# Patient Record
Sex: Female | Born: 1965 | Race: White | Hispanic: No | Marital: Single | Smoking: Former smoker
Health system: Southern US, Community
[De-identification: ages and names within clinical notes are randomized; demographics above are authoritative.]

## PROBLEM LIST (undated history)

## (undated) DIAGNOSIS — B009 Herpesviral infection, unspecified: Secondary | ICD-10-CM

## (undated) DIAGNOSIS — G4730 Sleep apnea, unspecified: Secondary | ICD-10-CM

## (undated) DIAGNOSIS — J45909 Unspecified asthma, uncomplicated: Secondary | ICD-10-CM

## (undated) DIAGNOSIS — F329 Major depressive disorder, single episode, unspecified: Secondary | ICD-10-CM

## (undated) DIAGNOSIS — Z803 Family history of malignant neoplasm of breast: Secondary | ICD-10-CM

## (undated) DIAGNOSIS — M5020 Other cervical disc displacement, unspecified cervical region: Secondary | ICD-10-CM

## (undated) DIAGNOSIS — F32A Depression, unspecified: Secondary | ICD-10-CM

## (undated) DIAGNOSIS — J189 Pneumonia, unspecified organism: Secondary | ICD-10-CM

## (undated) DIAGNOSIS — G5601 Carpal tunnel syndrome, right upper limb: Secondary | ICD-10-CM

## (undated) DIAGNOSIS — Z9221 Personal history of antineoplastic chemotherapy: Secondary | ICD-10-CM

## (undated) DIAGNOSIS — T7840XA Allergy, unspecified, initial encounter: Secondary | ICD-10-CM

## (undated) DIAGNOSIS — M545 Low back pain, unspecified: Secondary | ICD-10-CM

## (undated) DIAGNOSIS — R0600 Dyspnea, unspecified: Secondary | ICD-10-CM

## (undated) DIAGNOSIS — M1990 Unspecified osteoarthritis, unspecified site: Secondary | ICD-10-CM

## (undated) DIAGNOSIS — Z923 Personal history of irradiation: Secondary | ICD-10-CM

## (undated) DIAGNOSIS — Z8042 Family history of malignant neoplasm of prostate: Secondary | ICD-10-CM

## (undated) DIAGNOSIS — K635 Polyp of colon: Secondary | ICD-10-CM

## (undated) DIAGNOSIS — Z87442 Personal history of urinary calculi: Secondary | ICD-10-CM

## (undated) DIAGNOSIS — C50919 Malignant neoplasm of unspecified site of unspecified female breast: Secondary | ICD-10-CM

## (undated) DIAGNOSIS — K222 Esophageal obstruction: Secondary | ICD-10-CM

## (undated) DIAGNOSIS — F419 Anxiety disorder, unspecified: Secondary | ICD-10-CM

## (undated) DIAGNOSIS — G43909 Migraine, unspecified, not intractable, without status migrainosus: Secondary | ICD-10-CM

## (undated) DIAGNOSIS — K200 Eosinophilic esophagitis: Secondary | ICD-10-CM

## (undated) DIAGNOSIS — M810 Age-related osteoporosis without current pathological fracture: Secondary | ICD-10-CM

## (undated) DIAGNOSIS — G5600 Carpal tunnel syndrome, unspecified upper limb: Secondary | ICD-10-CM

## (undated) DIAGNOSIS — E785 Hyperlipidemia, unspecified: Secondary | ICD-10-CM

## (undated) HISTORY — DX: Family history of malignant neoplasm of prostate: Z80.42

## (undated) HISTORY — DX: Age-related osteoporosis without current pathological fracture: M81.0

## (undated) HISTORY — DX: Anxiety disorder, unspecified: F41.9

## (undated) HISTORY — PX: BREAST LUMPECTOMY: SHX2

## (undated) HISTORY — DX: Unspecified asthma, uncomplicated: J45.909

## (undated) HISTORY — DX: Esophageal obstruction: K22.2

## (undated) HISTORY — DX: Carpal tunnel syndrome, unspecified upper limb: G56.00

## (undated) HISTORY — DX: Low back pain: M54.5

## (undated) HISTORY — DX: Hyperlipidemia, unspecified: E78.5

## (undated) HISTORY — DX: Polyp of colon: K63.5

## (undated) HISTORY — DX: Depression, unspecified: F32.A

## (undated) HISTORY — DX: Other cervical disc displacement, unspecified cervical region: M50.20

## (undated) HISTORY — PX: PORT-A-CATH REMOVAL: SHX5289

## (undated) HISTORY — DX: Herpesviral infection, unspecified: B00.9

## (undated) HISTORY — DX: Allergy, unspecified, initial encounter: T78.40XA

## (undated) HISTORY — DX: Low back pain, unspecified: M54.50

## (undated) HISTORY — DX: Family history of malignant neoplasm of breast: Z80.3

## (undated) HISTORY — DX: Malignant neoplasm of unspecified site of unspecified female breast: C50.919

## (undated) HISTORY — PX: ESOPHAGOGASTRODUODENOSCOPY: SHX1529

## (undated) HISTORY — DX: Major depressive disorder, single episode, unspecified: F32.9

## (undated) HISTORY — DX: Eosinophilic esophagitis: K20.0

## (undated) HISTORY — DX: Migraine, unspecified, not intractable, without status migrainosus: G43.909

## (undated) HISTORY — PX: HERNIA REPAIR: SHX51

---

## 2004-03-14 HISTORY — PX: UMBILICAL HERNIA REPAIR: SHX196

## 2013-03-14 HISTORY — PX: THROAT SURGERY: SHX803

## 2016-06-01 ENCOUNTER — Ambulatory Visit (INDEPENDENT_AMBULATORY_CARE_PROVIDER_SITE_OTHER): Payer: BLUE CROSS/BLUE SHIELD | Admitting: Family Medicine

## 2016-06-01 ENCOUNTER — Encounter: Payer: Self-pay | Admitting: Family Medicine

## 2016-06-01 VITALS — BP 132/86 | HR 72 | Temp 98.7°F | Ht 64.75 in | Wt 184.1 lb

## 2016-06-01 DIAGNOSIS — Z0001 Encounter for general adult medical examination with abnormal findings: Secondary | ICD-10-CM | POA: Diagnosis not present

## 2016-06-01 DIAGNOSIS — R030 Elevated blood-pressure reading, without diagnosis of hypertension: Secondary | ICD-10-CM | POA: Diagnosis not present

## 2016-06-01 DIAGNOSIS — R928 Other abnormal and inconclusive findings on diagnostic imaging of breast: Secondary | ICD-10-CM

## 2016-06-01 DIAGNOSIS — J45909 Unspecified asthma, uncomplicated: Secondary | ICD-10-CM | POA: Insufficient documentation

## 2016-06-01 DIAGNOSIS — Z23 Encounter for immunization: Secondary | ICD-10-CM

## 2016-06-01 LAB — LIPID PANEL
CHOL/HDL RATIO: 6
Cholesterol: 292 mg/dL — ABNORMAL HIGH (ref 0–200)
HDL: 51.1 mg/dL (ref >39.00)
NONHDL: 241.12
Triglycerides: 344 mg/dL — ABNORMAL HIGH (ref 0.0–149.0)
VLDL: 68.8 mg/dL — ABNORMAL HIGH (ref 0.0–40.0)

## 2016-06-01 LAB — COMPREHENSIVE METABOLIC PANEL
ALT: 14 U/L (ref 0–35)
AST: 15 U/L (ref 0–37)
Albumin: 4.3 g/dL (ref 3.5–5.2)
Alkaline Phosphatase: 49 U/L (ref 39–117)
BUN: 15 mg/dL (ref 6–23)
CHLORIDE: 104 meq/L (ref 96–112)
CO2: 26 meq/L (ref 19–32)
CREATININE: 0.62 mg/dL (ref 0.40–1.20)
Calcium: 10 mg/dL (ref 8.4–10.5)
GFR: 109.05 mL/min (ref >60.00)
GLUCOSE: 107 mg/dL — AB (ref 70–99)
Potassium: 4 mEq/L (ref 3.5–5.1)
SODIUM: 138 meq/L (ref 135–145)
Total Bilirubin: 0.7 mg/dL (ref 0.2–1.2)
Total Protein: 7 g/dL (ref 6.0–8.3)

## 2016-06-01 LAB — CBC
HEMATOCRIT: 44.4 % (ref 36.0–46.0)
Hemoglobin: 15.8 g/dL — ABNORMAL HIGH (ref 12.0–15.0)
MCHC: 35.5 g/dL (ref 30.0–36.0)
MCV: 89.3 fl (ref 78.0–100.0)
Platelets: 352 10*3/uL (ref 150.0–400.0)
RBC: 4.98 Mil/uL (ref 3.87–5.11)
RDW: 12.8 % (ref 11.5–15.5)
WBC: 9.7 10*3/uL (ref 4.0–10.5)

## 2016-06-01 LAB — TSH: TSH: 3.38 u[IU]/mL (ref 0.35–4.50)

## 2016-06-01 LAB — LDL CHOLESTEROL, DIRECT: LDL DIRECT: 156 mg/dL

## 2016-06-01 LAB — HEMOGLOBIN A1C: Hgb A1c MFr Bld: 5.1 % (ref 4.6–6.5)

## 2016-06-01 MED ORDER — IBUPROFEN 800 MG PO TABS: 800.0000 mg | ORAL_TABLET | Freq: Three times a day (TID) | ORAL | 0 refills | Status: DC | PRN

## 2016-06-01 NOTE — Assessment & Plan Note (Addendum)
Will obtain medical records. Flu vaccine today. Unsure of tetanus. Pap smear up-to-date. Mammogram up-to-date. Will go ahead and order her future mammogram as she needs a follow-up in July. Labs today. BP elevated. Follow-up in one month for reevaluation.

## 2016-06-01 NOTE — Progress Notes (Signed)
Subjective:  Patient ID: Leah [name], female    DOB: 03/20/1965  Age: 51 y.o. MRN: [id number]  CC: Establish care  HPI Leah [name] is a 51 y.o. female presents to the clinic today to establish care. She is in need of a physical exam.  Preventative Healthcare  Pap smear: Up to date.  Mammogram: Up-to-date. She will need a mammogram in July (follow up from prior abnormal).  Colonoscopy: N/A.  Immunizations  Tetanus - Unsure.  Pneumococcal - Not indicated.  Flu - Flu vaccine today.  Labs: Labs today.  Alcohol use: See below.  Smoking/tobacco use: Former.  STD/HIV testing: Has had screening.   PMH, Surgical Hx, Family Hx, Social History reviewed and updated as below.  Past Medical History:  Diagnosis Date  . Asthma   . Carpal tunnel syndrome   . Herniated disc, cervical   . Low back pain   . Migraines    Past Surgical History:  Procedure Laterality Date  . CESAREAN SECTION    . ESOPHAGOGASTRODUODENOSCOPY     Family History  Problem Relation Age of Onset  . Breast cancer Mother   . Prostate cancer Father   . Breast cancer Maternal Grandmother    Social History  Substance Use Topics  . Smoking status: Former Smoker    Quit date: 06/02/2006  . Smokeless tobacco: Never Used  . Alcohol use 1.2 - 1.8 oz/week    2 - 3 Standard drinks or equivalent per week    Review of Systems  HENT: Positive for tinnitus.   Musculoskeletal: Positive for back pain and neck pain.  Neurological: Positive for numbness and headaches.  Psychiatric/Behavioral:       Stress.  All other systems reviewed and are negative.   Objective:   Today's Vitals: BP 132/86   Pulse 72   Temp 98.7 F (37.1 C) (Oral)   Ht 5' 4.75" (1.645 m)   Wt 184 lb 2 oz (83.5 kg)   SpO2 98%   BMI 30.88 kg/m   Physical Exam  Constitutional: She is oriented to person, place, and time. She appears well-developed and well-nourished. No distress.  HENT:  Head: Normocephalic and atraumatic.    Nose: Nose normal.  Mouth/Throat: Oropharynx is clear and moist. No oropharyngeal exudate.  Normal TM's bilaterally.   Eyes: Conjunctivae are normal. No scleral icterus.  Neck: Neck supple.  Cardiovascular: Normal rate and regular rhythm.   No murmur heard. Pulmonary/Chest: Effort normal and breath sounds normal. She has no wheezes. She has no rales.  Abdominal: Soft. She exhibits no distension. There is no tenderness. There is no rebound and no guarding.  Musculoskeletal: Normal range of motion. She exhibits no edema.  Lymphadenopathy:    She has no cervical adenopathy.  Neurological: She is alert and oriented to person, place, and time.  Skin: Skin is warm and dry. No rash noted.  Psychiatric: She has a normal mood and affect.  Vitals reviewed.  Assessment & Plan:   Problem List Items Addressed This Visit    Elevated BP without diagnosis of hypertension   Encounter for general adult medical examination with abnormal findings - Primary    Will obtain medical records. Flu vaccine today. Unsure of tetanus. Pap smear up-to-date. Mammogram up-to-date. Will go ahead and order her future mammogram as she needs a follow-up in July. Labs today. BP elevated. Follow-up in one month for reevaluation.      Relevant Orders   CBC   Hemoglobin A1c   Comprehensive metabolic panel  Lipid panel   TSH    Other Visit Diagnoses    Encounter for immunization       Relevant Medications   methocarbamol (ROBAXIN) 750 MG tablet   ibuprofen (ADVIL,MOTRIN) 800 MG tablet   Other Relevant Orders   CBC   Hemoglobin A1c   Comprehensive metabolic panel   Lipid panel   TSH   Flu Vaccine QUAD 36+ mos IM (Completed)   Abnormal mammogram       Relevant Orders   MM Digital Diagnostic Bilat      Outpatient Encounter Prescriptions as of 06/01/2016  Medication Sig  . methocarbamol (ROBAXIN) 750 MG tablet   . ibuprofen (ADVIL,MOTRIN) 800 MG tablet Take 1 tablet (800 mg total) by mouth every 8  (eight) hours as needed.   No facility-administered encounter medications on file as of 06/01/2016.     Follow-up: Return in about 1 month (around 07/02/2016) for BP check.  [place]

## 2016-06-01 NOTE — Progress Notes (Signed)
Pre visit review using our clinic review tool, if applicable. No additional management support is needed unless otherwise documented below in the visit note. 

## 2016-06-01 NOTE — Patient Instructions (Signed)
We will get your records and take care of your mammogram in July.  Ibuprofen as needed.  Follow up in 1 month for a BP check.  Take care  Dr. Lacinda Axon   Health Maintenance, Female Adopting a healthy lifestyle and getting preventive care can go a long way to promote health and wellness. Talk with your health care provider about what schedule of regular examinations is right for you. This is a good chance for you to check in with your provider about disease prevention and staying healthy. In between checkups, there are plenty of things you can do on your own. Experts have done a lot of research about which lifestyle changes and preventive measures are most likely to keep you healthy. Ask your health care provider for more information. Weight and diet Eat a healthy diet  Be sure to include plenty of vegetables, fruits, low-fat dairy products, and lean protein.  Do not eat a lot of foods high in solid fats, added sugars, or salt.  Get regular exercise. This is one of the most important things you can do for your health.  Most adults should exercise for at least 150 minutes each week. The exercise should increase your heart rate and make you sweat (moderate-intensity exercise).  Most adults should also do strengthening exercises at least twice a week. This is in addition to the moderate-intensity exercise. Maintain a healthy weight  Body mass index (BMI) is a measurement that can be used to identify possible weight problems. It estimates body fat based on height and weight. Your health care provider can help determine your BMI and help you achieve or maintain a healthy weight.  For females 78 years of age and older:  A BMI below 18.5 is considered underweight.  A BMI of 18.5 to 24.9 is normal.  A BMI of 25 to 29.9 is considered overweight.  A BMI of 30 and above is considered obese. Watch levels of cholesterol and blood lipids  You should start having your blood tested for lipids and  cholesterol at 49 years of age, then have this test every 5 years.  You may need to have your cholesterol levels checked more often if:  Your lipid or cholesterol levels are high.  You are older than 49 years of age.  You are at high risk for heart disease. Cancer screening Lung Cancer  Lung cancer screening is recommended for adults 52-107 years old who are at high risk for lung cancer because of a history of smoking.  A yearly low-dose CT scan of the lungs is recommended for people who:  Currently smoke.  Have quit within the past 15 years.  Have at least a 30-pack-year history of smoking. A pack year is smoking an average of one pack of cigarettes a day for 1 year.  Yearly screening should continue until it has been 15 years since you quit.  Yearly screening should stop if you develop a health problem that would prevent you from having lung cancer treatment. Breast Cancer  Practice breast self-awareness. This means understanding how your breasts normally appear and feel.  It also means doing regular breast self-exams. Let your health care provider know about any changes, no matter how small.  If you are in your 20s or 30s, you should have a clinical breast exam (CBE) by a health care provider every 1-3 years as part of a regular health exam.  If you are 78 or older, have a CBE every year. Also consider having a  breast X-ray (mammogram) every year.  If you have a family history of breast cancer, talk to your health care provider about genetic screening.  If you are at high risk for breast cancer, talk to your health care provider about having an MRI and a mammogram every year.  Breast cancer gene (BRCA) assessment is recommended for women who have family members with BRCA-related cancers. BRCA-related cancers include:  Breast.  Ovarian.  Tubal.  Peritoneal cancers.  Results of the assessment will determine the need for genetic counseling and BRCA1 and BRCA2  testing. Cervical Cancer  Your health care provider may recommend that you be screened regularly for cancer of the pelvic organs (ovaries, uterus, and vagina). This screening involves a pelvic examination, including checking for microscopic changes to the surface of your cervix (Pap test). You may be encouraged to have this screening done every 3 years, beginning at age 46.  For women ages 34-65, health care providers may recommend pelvic exams and Pap testing every 3 years, or they may recommend the Pap and pelvic exam, combined with testing for human papilloma virus (HPV), every 5 years. Some types of HPV increase your risk of cervical cancer. Testing for HPV may also be done on women of any age with unclear Pap test results.  Other health care providers may not recommend any screening for nonpregnant women who are considered low risk for pelvic cancer and who do not have symptoms. Ask your health care provider if a screening pelvic exam is right for you.  If you have had past treatment for cervical cancer or a condition that could lead to cancer, you need Pap tests and screening for cancer for at least 20 years after your treatment. If Pap tests have been discontinued, your risk factors (such as having a new sexual partner) need to be reassessed to determine if screening should resume. Some women have medical problems that increase the chance of getting cervical cancer. In these cases, your health care provider may recommend more frequent screening and Pap tests. Colorectal Cancer  This type of cancer can be detected and often prevented.  Routine colorectal cancer screening usually begins at 49 years of age and continues through 49 years of age.  Your health care provider may recommend screening at an earlier age if you have risk factors for colon cancer.  Your health care provider may also recommend using home test kits to check for hidden blood in the stool.  A small camera at the end of a  tube can be used to examine your colon directly (sigmoidoscopy or colonoscopy). This is done to check for the earliest forms of colorectal cancer.  Routine screening usually begins at age 11.  Direct examination of the colon should be repeated every 5-10 years through 49 years of age. However, you may need to be screened more often if early forms of precancerous polyps or small growths are found. Skin Cancer  Check your skin from head to toe regularly.  Tell your health care provider about any new moles or changes in moles, especially if there is a change in a mole's shape or color.  Also tell your health care provider if you have a mole that is larger than the size of a pencil eraser.  Always use sunscreen. Apply sunscreen liberally and repeatedly throughout the day.  Protect yourself by wearing long sleeves, pants, a wide-brimmed hat, and sunglasses whenever you are outside. Heart disease, diabetes, and high blood pressure  High blood pressure causes  heart disease and increases the risk of stroke. High blood pressure is more likely to develop in:  People who have blood pressure in the high end of the normal range (130-139/85-89 mm Hg).  People who are overweight or obese.  People who are African American.  If you are 70-59 years of age, have your blood pressure checked every 3-5 years. If you are 82 years of age or older, have your blood pressure checked every year. You should have your blood pressure measured twice-once when you are at a hospital or clinic, and once when you are not at a hospital or clinic. Record the average of the two measurements. To check your blood pressure when you are not at a hospital or clinic, you can use:  An automated blood pressure machine at a pharmacy.  A home blood pressure monitor.  If you are between 16 years and 24 years old, ask your health care provider if you should take aspirin to prevent strokes.  Have regular diabetes screenings. This  involves taking a blood sample to check your fasting blood sugar level.  If you are at a normal weight and have a low risk for diabetes, have this test once every three years after 49 years of age.  If you are overweight and have a high risk for diabetes, consider being tested at a younger age or more often. Preventing infection Hepatitis B  If you have a higher risk for hepatitis B, you should be screened for this virus. You are considered at high risk for hepatitis B if:  You were born in a country where hepatitis B is common. Ask your health care provider which countries are considered high risk.  Your parents were born in a high-risk country, and you have not been immunized against hepatitis B (hepatitis B vaccine).  You have HIV or AIDS.  You use needles to inject street drugs.  You live with someone who has hepatitis B.  You have had sex with someone who has hepatitis B.  You get hemodialysis treatment.  You take certain medicines for conditions, including cancer, organ transplantation, and autoimmune conditions. Hepatitis C  Blood testing is recommended for:  Everyone born from [birth year] through 1963.  Anyone with known risk factors for hepatitis C. Sexually transmitted infections (STIs)  You should be screened for sexually transmitted infections (STIs) including gonorrhea and chlamydia if:  You are sexually active and are younger than 49 years of age.  You are older than 49 years of age and your health care provider tells you that you are at risk for this type of infection.  Your sexual activity has changed since you were last screened and you are at an increased risk for chlamydia or gonorrhea. Ask your health care provider if you are at risk.  If you do not have HIV, but are at risk, it may be recommended that you take a prescription medicine daily to prevent HIV infection. This is called pre-exposure prophylaxis (PrEP). You are considered at risk if:  You are  sexually active and do not regularly use condoms or know the HIV status of your partner(s).  You take drugs by injection.  You are sexually active with a partner who has HIV. Talk with your health care provider about whether you are at high risk of being infected with HIV. If you choose to begin PrEP, you should first be tested for HIV. You should then be tested every 3 months for as long as you are taking  PrEP. Pregnancy  If you are premenopausal and you may become pregnant, ask your health care provider about preconception counseling.  If you may become pregnant, take 400 to 800 micrograms (mcg) of folic acid every day.  If you want to prevent pregnancy, talk to your health care provider about birth control (contraception). Osteoporosis and menopause  Osteoporosis is a disease in which the bones lose minerals and strength with aging. This can result in serious bone fractures. Your risk for osteoporosis can be identified using a bone density scan.  If you are 24 years of age or older, or if you are at risk for osteoporosis and fractures, ask your health care provider if you should be screened.  Ask your health care provider whether you should take a calcium or vitamin D supplement to lower your risk for osteoporosis.  Menopause may have certain physical symptoms and risks.  Hormone replacement therapy may reduce some of these symptoms and risks. Talk to your health care provider about whether hormone replacement therapy is right for you. Follow these instructions at home:  Schedule regular health, dental, and eye exams.  Stay current with your immunizations.  Do not use any tobacco products including cigarettes, chewing tobacco, or electronic cigarettes.  If you are pregnant, do not drink alcohol.  If you are breastfeeding, limit how much and how often you drink alcohol.  Limit alcohol intake to no more than 1 drink per day for nonpregnant women. One drink equals 12 ounces of  beer, 5 ounces of wine, or 1 ounces of hard liquor.  Do not use street drugs.  Do not share needles.  Ask your health care provider for help if you need support or information about quitting drugs.  Tell your health care provider if you often feel depressed.  Tell your health care provider if you have ever been abused or do not feel safe at home. This information is not intended to replace advice given to you by your health care provider. Make sure you discuss any questions you have with your health care provider. Document Released: 09/13/2010 Document Revised: 08/06/2015 Document Reviewed: 12/02/2014 Elsevier Interactive Patient Education  2017 [employer].

## 2016-06-03 ENCOUNTER — Other Ambulatory Visit: Payer: Self-pay | Admitting: Family Medicine

## 2016-06-03 DIAGNOSIS — D582 Other hemoglobinopathies: Secondary | ICD-10-CM

## 2016-06-30 ENCOUNTER — Other Ambulatory Visit: Payer: Self-pay | Admitting: Family Medicine

## 2016-06-30 DIAGNOSIS — Z1239 Encounter for other screening for malignant neoplasm of breast: Secondary | ICD-10-CM

## 2016-07-05 ENCOUNTER — Ambulatory Visit (INDEPENDENT_AMBULATORY_CARE_PROVIDER_SITE_OTHER): Payer: BLUE CROSS/BLUE SHIELD | Admitting: *Deleted

## 2016-07-05 ENCOUNTER — Other Ambulatory Visit (INDEPENDENT_AMBULATORY_CARE_PROVIDER_SITE_OTHER): Payer: BLUE CROSS/BLUE SHIELD

## 2016-07-05 VITALS — BP 150/80 | HR 81 | Resp 16

## 2016-07-05 DIAGNOSIS — R030 Elevated blood-pressure reading, without diagnosis of hypertension: Secondary | ICD-10-CM

## 2016-07-05 DIAGNOSIS — D582 Other hemoglobinopathies: Secondary | ICD-10-CM | POA: Diagnosis not present

## 2016-07-05 LAB — CBC
HCT: 44 % (ref 36.0–46.0)
Hemoglobin: 15.6 g/dL — ABNORMAL HIGH (ref 12.0–15.0)
MCHC: 35.5 g/dL (ref 30.0–36.0)
MCV: 89.9 fl (ref 78.0–100.0)
Platelets: 344 10*3/uL (ref 150.0–400.0)
RBC: 4.89 Mil/uL (ref 3.87–5.11)
RDW: 12.5 % (ref 11.5–15.5)
WBC: 8.7 10*3/uL (ref 4.0–10.5)

## 2016-07-05 NOTE — Progress Notes (Signed)
One month follow up for elevated BP check p, patient is not currently on medication . BP left arm 146/90 pulse 88, after 5- m8 minute wait BP right arm 150/80 pulse 81 ,. BP was attained according to nursing standards.

## 2016-07-05 NOTE — Progress Notes (Signed)
BP still elevated. Would advise starting medication. Will defer to PCP regarding type of medication to start. Will forward to Dr Lacinda Axon.   Tommi Rumps, M.D.

## 2016-07-06 ENCOUNTER — Telehealth: Payer: Self-pay | Admitting: Family Medicine

## 2016-07-06 ENCOUNTER — Other Ambulatory Visit: Payer: Self-pay | Admitting: Family Medicine

## 2016-07-06 DIAGNOSIS — D582 Other hemoglobinopathies: Secondary | ICD-10-CM

## 2016-07-06 DIAGNOSIS — M62838 Other muscle spasm: Secondary | ICD-10-CM

## 2016-07-06 MED ORDER — METHOCARBAMOL 750 MG PO TABS: 750.0000 mg | ORAL_TABLET | Freq: Four times a day (QID) | ORAL | 0 refills | Status: DC | PRN

## 2016-07-06 NOTE — Telephone Encounter (Signed)
Refill sent.

## 2016-07-06 NOTE — Progress Notes (Signed)
Needs to start medication.  [place]

## 2016-07-06 NOTE — Telephone Encounter (Signed)
Patient in Office on 07/05/16 for BP check,requested refill for methocarbamol from historical provider ok to fill medication?

## 2016-07-06 NOTE — Telephone Encounter (Signed)
Okay to refill? 

## 2016-07-06 NOTE — Progress Notes (Signed)
Patient willing to start BP medication.

## 2016-07-08 ENCOUNTER — Inpatient Hospital Stay
Admission: RE | Admit: 2016-07-08 | Discharge: 2016-07-08 | Disposition: A | Discharge status: Home / Self Care | Payer: Self-pay | Source: Ambulatory Visit | Attending: *Deleted | Admitting: *Deleted

## 2016-07-08 ENCOUNTER — Other Ambulatory Visit: Payer: Self-pay | Admitting: *Deleted

## 2016-07-08 DIAGNOSIS — Z9289 Personal history of other medical treatment: Secondary | ICD-10-CM

## 2016-07-16 ENCOUNTER — Other Ambulatory Visit: Payer: Self-pay | Admitting: Family Medicine

## 2016-07-18 NOTE — Telephone Encounter (Signed)
Please advise for refill, thanks 

## 2016-07-29 ENCOUNTER — Encounter: Payer: Self-pay | Admitting: Oncology

## 2016-07-29 ENCOUNTER — Other Ambulatory Visit: Payer: Self-pay

## 2016-07-29 ENCOUNTER — Inpatient Hospital Stay: Payer: BLUE CROSS/BLUE SHIELD | Attending: Oncology | Admitting: Oncology

## 2016-07-29 ENCOUNTER — Ambulatory Visit
Admission: RE | Admit: 2016-07-29 | Discharge: 2016-07-29 | Disposition: A | Discharge status: Home / Self Care | Payer: BLUE CROSS/BLUE SHIELD | Source: Ambulatory Visit | Attending: Oncology | Admitting: Oncology

## 2016-07-29 ENCOUNTER — Inpatient Hospital Stay: Payer: BLUE CROSS/BLUE SHIELD

## 2016-07-29 VITALS — BP 147/91 | HR 80 | Temp 99.0°F | Resp 18 | Ht 64.96 in | Wt 182.1 lb

## 2016-07-29 DIAGNOSIS — G4700 Insomnia, unspecified: Secondary | ICD-10-CM | POA: Insufficient documentation

## 2016-07-29 DIAGNOSIS — Z87891 Personal history of nicotine dependence: Secondary | ICD-10-CM | POA: Insufficient documentation

## 2016-07-29 DIAGNOSIS — D751 Secondary polycythemia: Secondary | ICD-10-CM

## 2016-07-29 DIAGNOSIS — R030 Elevated blood-pressure reading, without diagnosis of hypertension: Secondary | ICD-10-CM | POA: Diagnosis not present

## 2016-07-29 DIAGNOSIS — Z803 Family history of malignant neoplasm of breast: Secondary | ICD-10-CM | POA: Insufficient documentation

## 2016-07-29 DIAGNOSIS — J45909 Unspecified asthma, uncomplicated: Secondary | ICD-10-CM | POA: Diagnosis not present

## 2016-07-29 DIAGNOSIS — M549 Dorsalgia, unspecified: Secondary | ICD-10-CM | POA: Insufficient documentation

## 2016-07-29 DIAGNOSIS — Z8042 Family history of malignant neoplasm of prostate: Secondary | ICD-10-CM | POA: Diagnosis not present

## 2016-07-29 LAB — COMPREHENSIVE METABOLIC PANEL
ALBUMIN: 4.3 g/dL (ref 3.5–5.0)
ALK PHOS: 45 U/L (ref 38–126)
ALT: 18 U/L (ref 14–54)
AST: 20 U/L (ref 15–41)
Anion gap: 6 (ref 5–15)
BILIRUBIN TOTAL: 0.8 mg/dL (ref 0.3–1.2)
BUN: 14 mg/dL (ref 6–20)
CALCIUM: 9.6 mg/dL (ref 8.9–10.3)
CO2: 24 mmol/L (ref 22–32)
CREATININE: 0.58 mg/dL (ref 0.44–1.00)
Chloride: 104 mmol/L (ref 101–111)
GFR calc Af Amer: >60 mL/min (ref >60)
GFR calc non Af Amer: >60 mL/min (ref >60)
GLUCOSE: 108 mg/dL — AB (ref 65–99)
Potassium: 3.9 mmol/L (ref 3.5–5.1)
SODIUM: 134 mmol/L — AB (ref 135–145)
Total Protein: 7.8 g/dL (ref 6.5–8.1)

## 2016-07-29 LAB — URINALYSIS, COMPLETE (UACMP) WITH MICROSCOPIC
BACTERIA UA: NONE SEEN
BILIRUBIN URINE: NEGATIVE
Glucose, UA: NEGATIVE mg/dL
Hgb urine dipstick: NEGATIVE
Ketones, ur: NEGATIVE mg/dL
Nitrite: NEGATIVE
Protein, ur: NEGATIVE mg/dL
SPECIFIC GRAVITY, URINE: 1.012 (ref 1.005–1.030)
pH: 5 (ref 5.0–8.0)

## 2016-07-29 LAB — CBC WITH DIFFERENTIAL/PLATELET
BASOS PCT: 1 %
Basophils Absolute: 0.1 10*3/uL (ref 0–0.1)
EOS ABS: 0.3 10*3/uL (ref 0–0.7)
Eosinophils Relative: 4 %
HEMATOCRIT: 44.9 % (ref 35.0–47.0)
HEMOGLOBIN: 15.9 g/dL (ref 12.0–16.0)
LYMPHS ABS: 1.8 10*3/uL (ref 1.0–3.6)
Lymphocytes Relative: 24 %
MCH: 31.7 pg (ref 26.0–34.0)
MCHC: 35.5 g/dL (ref 32.0–36.0)
MCV: 89.4 fL (ref 80.0–100.0)
Monocytes Absolute: 0.8 10*3/uL (ref 0.2–0.9)
Monocytes Relative: 10 %
NEUTROS ABS: 4.8 10*3/uL (ref 1.4–6.5)
NEUTROS PCT: 61 %
Platelets: 352 10*3/uL (ref 150–440)
RBC: 5.02 MIL/uL (ref 3.80–5.20)
RDW: 12.1 % (ref 11.5–14.5)
WBC: 7.8 10*3/uL (ref 3.6–11.0)

## 2016-07-29 NOTE — Progress Notes (Signed)
Hematology/Oncology Consult note [facility] Telephone:([phone] [phone]  Patient Care Team: Coral Spikes, DO as PCP - General (Family Medicine)   Name of the patient: Leah [name]  [id number]  1965/12/13    Reason for referral- polycythemia   Referring physician- Dr. Thersa Salt  Date of visit: 07/29/16   History of presenting illness- patient is a 51 year old female with no significant past medical history who was been referred to [place] for elevated hemoglobin. She smoked for about 20 years on and off and quit smoking about 6-7 years ago. She does not have any known chronic lung disease. She does report trouble with sleeping and wakes up a few times in the night. Recent hemoglobin from March and April 2018 was 15.8 and 15.6 respectively. Recent CBC from 07/05/2016 showed white count of 8.7, H&H of 15.6/44 and a platelet count of 344  ECOG PS- 0  Pain scale- 0   Review of systems- Review of Systems  Constitutional: Negative for chills, fever, malaise/fatigue and weight loss.  HENT: Negative for congestion, ear discharge and nosebleeds.   Eyes: Negative for blurred vision.  Respiratory: Negative for cough, hemoptysis, sputum production, shortness of breath and wheezing.   Cardiovascular: Negative for chest pain, palpitations, orthopnea and claudication.  Gastrointestinal: Negative for abdominal pain, blood in stool, constipation, diarrhea, heartburn, melena, nausea and vomiting.  Genitourinary: Negative for dysuria, flank pain, frequency, hematuria and urgency.  Musculoskeletal: Negative for back pain, joint pain and myalgias.  Skin: Negative for rash.  Neurological: Negative for dizziness, tingling, focal weakness, seizures, weakness and headaches.  Endo/Heme/Allergies: Does not bruise/bleed easily.  Psychiatric/Behavioral: Negative for depression and suicidal ideas. The patient does not have insomnia.     Allergies  Allergen Reactions  .  Penicillins   . Sulfa Antibiotics     Patient Active Problem List   Diagnosis Date Noted  . Elevated BP without diagnosis of hypertension 06/01/2016  . Asthma 06/01/2016  . Encounter for general adult medical examination with abnormal findings 06/01/2016     Past Medical History:  Diagnosis Date  . Asthma   . Carpal tunnel syndrome    R hand   . Herniated disc, cervical   . Low back pain   . Migraines      Past Surgical History:  Procedure Laterality Date  . CESAREAN SECTION    . ESOPHAGOGASTRODUODENOSCOPY    . THROAT SURGERY  2015  . UMBILICAL HERNIA REPAIR  2006    x 2   w mesh per pt    Social History   Social History  . Marital status: Single    Spouse name: N/A  . Number of children: N/A  . Years of education: N/A   Occupational History  . Not on file.   Social History Main Topics  . Smoking status: Former Smoker    Packs/day: 1.00    Years: 20.00    Quit date: 06/02/2006  . Smokeless tobacco: Never Used  . Alcohol use 1.2 - 1.8 oz/week    2 - 3 Standard drinks or equivalent per week     Comment: socially  . Drug use: No  . Sexual activity: Yes    Partners: Male   Other Topics Concern  . Not on file   Social History Narrative  . No narrative on file     Family History  Problem Relation Age of Onset  . Breast cancer Mother   . Thyroid disease Mother   . Prostate cancer  Father   . Cancer - Prostate Father   . Breast cancer Maternal Grandmother      Current Outpatient Prescriptions:  .  ibuprofen (ADVIL,MOTRIN) 800 MG tablet, TAKE 1 TABLET (800 MG TOTAL) BY MOUTH EVERY 8 (EIGHT) HOURS AS NEEDED., Disp: 90 tablet, Rfl: 0 .  methocarbamol (ROBAXIN) 750 MG tablet, Take 1 tablet (750 mg total) by mouth every 6 (six) hours as needed for muscle spasms. (Patient not taking: Reported on 07/29/2016), Disp: 120 tablet, Rfl: 0   Physical exam:  Vitals:   07/29/16 1131  BP: (!) 147/91  Pulse: 80  Resp: 18  Temp: 99 F (37.2 C)  TempSrc:  Tympanic  Weight: 182 lb 1.6 oz (82.6 kg)  Height: 5' 4.96" (1.65 m)   Physical Exam  Constitutional: She is oriented to person, place, and time and well-developed, well-nourished, and in no distress.  HENT:  Head: Normocephalic and atraumatic.  Eyes: EOM are normal. Pupils are equal, round, and reactive to light.  Neck: Normal range of motion.  Cardiovascular: Normal rate, regular rhythm and normal heart sounds.   Pulmonary/Chest: Effort normal and breath sounds normal.  Abdominal: Soft. Bowel sounds are normal.  No palpable splenomegaly  Neurological: She is alert and oriented to person, place, and time.  Skin: Skin is warm and dry.       CMP Latest Ref Rng & Units 07/29/2016  Glucose 65 - 99 mg/dL 108(H)  BUN 6 - 20 mg/dL 14  Creatinine 0.44 - 1.00 mg/dL 0.58  Sodium 135 - 145 mmol/L 134(L)  Potassium 3.5 - 5.1 mmol/L 3.9  Chloride 101 - 111 mmol/L 104  CO2 22 - 32 mmol/L 24  Calcium 8.9 - 10.3 mg/dL 9.6  Total Protein 6.5 - 8.1 g/dL 7.8  Total Bilirubin 0.3 - 1.2 mg/dL 0.8  Alkaline Phos 38 - 126 U/L 45  AST 15 - 41 U/L 20  ALT 14 - 54 U/L 18   CBC Latest Ref Rng & Units 07/29/2016  WBC 3.6 - 11.0 K/uL 7.8  Hemoglobin 12.0 - 16.0 g/dL 15.9  Hematocrit 35.0 - 47.0 % 44.9  Platelets 150 - 440 K/uL 352    No images are attached to the encounter.  Dg Chest 2 View  Result Date: 07/29/2016 CLINICAL DATA:  Elevated hemoglobin. History of polycythemia and asthma. EXAM: CHEST  2 VIEW COMPARISON:  None. FINDINGS: Cardiomediastinal silhouette is normal. No pleural effusions or focal consolidations. Trachea projects midline and there is no pneumothorax. Soft tissue planes and included osseous structures are non-suspicious. IMPRESSION: Normal chest. Electronically Signed   By: Elon Alas M.D.   On: 07/29/2016 13:42   Mm Outside Films Mammo  Result Date: 07/08/2016 This examination belongs to an outside facility and is stored here for comparison purposes only.  Contact  the originating outside institution for any associated report or interpretation.   Assessment and plan- Patient is a 51 y.o. female referred to [place] for elevated hemoglobin  Prior 2 cbc's show hb around 15 and hct 44. Hb has never been more than 16 or hct more than 48. Strictly speaking patient does not meet the definition for polycythemia since her hemoglobin has never been more than 16 or hematocrit more than 48. However given her high normal hemoglobin and I will proceed with CBC with differential, cmp, jak 2, abl testing and epo level. I will also obtain chest x-ray and urinalysis. I explained that polycythemia can be primary or secondary. Secondary causes include OSA, smoking,c hronic lung disease  or tumor associated polycythemia. I will see her back in 2 weeks and discuss results of blood work and further management   Thank you for this kind referral and the opportunity to participate in the care of this patient   Visit Diagnosis 1. Polycythemia     Dr. Randa Evens, MD, MPH [place] at [facility] Pager- [phone] 07/29/2016

## 2016-07-29 NOTE — Progress Notes (Signed)
Here for new pt evaluation  ADD  Pulse ox -98% on RA

## 2016-07-30 LAB — ERYTHROPOIETIN: Erythropoietin: 10.7 m[IU]/mL (ref 2.6–18.5)

## 2016-08-05 LAB — JAK2 EXONS 12-15

## 2016-08-19 ENCOUNTER — Inpatient Hospital Stay: Payer: BLUE CROSS/BLUE SHIELD | Attending: Oncology | Admitting: Oncology

## 2016-08-19 VITALS — BP 139/86 | HR 58 | Temp 97.6°F | Wt 180.1 lb

## 2016-08-19 DIAGNOSIS — Z803 Family history of malignant neoplasm of breast: Secondary | ICD-10-CM | POA: Diagnosis not present

## 2016-08-19 DIAGNOSIS — D751 Secondary polycythemia: Secondary | ICD-10-CM

## 2016-08-19 DIAGNOSIS — Z87891 Personal history of nicotine dependence: Secondary | ICD-10-CM | POA: Insufficient documentation

## 2016-08-19 DIAGNOSIS — Z8042 Family history of malignant neoplasm of prostate: Secondary | ICD-10-CM | POA: Insufficient documentation

## 2016-08-19 DIAGNOSIS — J45909 Unspecified asthma, uncomplicated: Secondary | ICD-10-CM | POA: Insufficient documentation

## 2016-08-19 NOTE — Progress Notes (Signed)
Patient here today for follow up.   

## 2016-08-20 ENCOUNTER — Encounter: Payer: Self-pay | Admitting: Oncology

## 2016-08-20 NOTE — Progress Notes (Signed)
Hematology/Oncology Consult note [facility]  Telephone:([phone] [phone]  Patient Care Team: Coral Spikes, DO as PCP - General (Family Medicine)   Name of the patient: Leah [name]  [id number]  [birth year]   Date of visit: 08/20/16  Diagnosis- secondary polycythemia etiology unclear- prior smoking versus possible undiagnosed OSA  Chief complaint/ Reason for visit- discuss results of bloodwork  Heme/Onc history: patient is a 51 year old female with no significant past medical history who was been referred to [place] for elevated hemoglobin. She smoked for about 20 years on and off and quit smoking about 6-7 years ago. She does not have any known chronic lung disease. She does report trouble with sleeping and wakes up a few times in the night. Recent hemoglobin from March and April 2018 was 15.8 and 15.6 respectively. Recent CBC from 07/05/2016 showed white count of 8.7, H&H of 15.6/44 and a platelet count of 344  Repeat cbc from 07/29/16 showed H/H of 15.9/44.9. Wbc and platelet count were normal. epo level was normal. JAK2 mutation testing negative. UA did not reveal any hematuria. cxr was normal  Interval history- doing well. Denies any complaints  Review of systems- Review of Systems  Constitutional: Negative for chills, fever, malaise/fatigue and weight loss.  HENT: Negative for congestion, ear discharge and nosebleeds.   Eyes: Negative for blurred vision.  Respiratory: Negative for cough, hemoptysis, sputum production, shortness of breath and wheezing.   Cardiovascular: Negative for chest pain, palpitations, orthopnea and claudication.  Gastrointestinal: Negative for abdominal pain, blood in stool, constipation, diarrhea, heartburn, melena, nausea and vomiting.  Genitourinary: Negative for dysuria, flank pain, frequency, hematuria and urgency.  Musculoskeletal: Negative for back pain, joint pain and myalgias.  Skin: Negative for rash.    Neurological: Negative for dizziness, tingling, focal weakness, seizures, weakness and headaches.  Endo/Heme/Allergies: Does not bruise/bleed easily.  Psychiatric/Behavioral: Negative for depression and suicidal ideas. The patient does not have insomnia.       Allergies  Allergen Reactions  . Penicillins   . Sulfa Antibiotics      Past Medical History:  Diagnosis Date  . Asthma   . Carpal tunnel syndrome    R hand   . Herniated disc, cervical   . Low back pain   . Migraines      Past Surgical History:  Procedure Laterality Date  . CESAREAN SECTION    . ESOPHAGOGASTRODUODENOSCOPY    . THROAT SURGERY  2015  . UMBILICAL HERNIA REPAIR  2006    x 2   w mesh per pt    Social History   Social History  . Marital status: Single    Spouse name: N/A  . Number of children: N/A  . Years of education: N/A   Occupational History  . Not on file.   Social History Main Topics  . Smoking status: Former Smoker    Packs/day: 1.00    Years: 20.00    Quit date: 06/02/2006  . Smokeless tobacco: Never Used  . Alcohol use 1.2 - 1.8 oz/week    2 - 3 Standard drinks or equivalent per week     Comment: socially  . Drug use: No  . Sexual activity: Yes    Partners: Male   Other Topics Concern  . Not on file   Social History Narrative  . No narrative on file    Family History  Problem Relation Age of Onset  . Breast cancer Mother   . Thyroid disease Mother   .  Prostate cancer Father   . Cancer - Prostate Father   . Breast cancer Maternal Grandmother      Current Outpatient Prescriptions:  .  ibuprofen (ADVIL,MOTRIN) 800 MG tablet, TAKE 1 TABLET (800 MG TOTAL) BY MOUTH EVERY 8 (EIGHT) HOURS AS NEEDED., Disp: 90 tablet, Rfl: 0 .  methocarbamol (ROBAXIN) 750 MG tablet, Take 1 tablet (750 mg total) by mouth every 6 (six) hours as needed for muscle spasms., Disp: 120 tablet, Rfl: 0  Physical exam:  Vitals:   08/19/16 1100  BP: 139/86  Pulse: (!) 58  Temp: 97.6 F (36.4  C)  TempSrc: Tympanic  Weight: 180 lb 2 oz (81.7 kg)   Physical Exam  Constitutional: She is oriented to person, place, and time and well-developed, well-nourished, and in no distress.  HENT:  Head: Normocephalic and atraumatic.  Eyes: EOM are normal. Pupils are equal, round, and reactive to light.  Neck: Normal range of motion.  Cardiovascular: Normal rate, regular rhythm and normal heart sounds.   Pulmonary/Chest: Effort normal and breath sounds normal.  Abdominal: Soft. Bowel sounds are normal.  Neurological: She is alert and oriented to person, place, and time.  Skin: Skin is warm and dry.     CMP Latest Ref Rng & Units 07/29/2016  Glucose 65 - 99 mg/dL 108(H)  BUN 6 - 20 mg/dL 14  Creatinine 0.44 - 1.00 mg/dL 0.58  Sodium 135 - 145 mmol/L 134(L)  Potassium 3.5 - 5.1 mmol/L 3.9  Chloride 101 - 111 mmol/L 104  CO2 22 - 32 mmol/L 24  Calcium 8.9 - 10.3 mg/dL 9.6  Total Protein 6.5 - 8.1 g/dL 7.8  Total Bilirubin 0.3 - 1.2 mg/dL 0.8  Alkaline Phos 38 - 126 U/L 45  AST 15 - 41 U/L 20  ALT 14 - 54 U/L 18   CBC Latest Ref Rng & Units 07/29/2016  WBC 3.6 - 11.0 K/uL 7.8  Hemoglobin 12.0 - 16.0 g/dL 15.9  Hematocrit 35.0 - 47.0 % 44.9  Platelets 150 - 440 K/uL 352    No images are attached to the encounter.  Dg Chest 2 View  Result Date: 07/29/2016 CLINICAL DATA:  Elevated hemoglobin. History of polycythemia and asthma. EXAM: CHEST  2 VIEW COMPARISON:  None. FINDINGS: Cardiomediastinal silhouette is normal. No pleural effusions or focal consolidations. Trachea projects midline and there is no pneumothorax. Soft tissue planes and included osseous structures are non-suspicious. IMPRESSION: Normal chest. Electronically Signed   By: Elon Alas M.D.   On: 07/29/2016 13:42     Assessment and plan- Patient is a 51 y.o. female referred for polycythemia likely secondary- prior smoking versus undiagnosed OSA  She does not meet strict criteria for polycythemia as her hb <16.  Nevertheless polycythemia vera has been ruled out given negative jak2 mutation testing and normal EPO. This could be due to undiganosed oSA versus prior smoking. This can be monitored conservatively at this point. She does not need phlebotomy t this time. Repeat cbc in 3 months and I will see her in 6 months with cbc   Visit Diagnosis 1. Secondary polycythemia      Dr. Randa Evens, MD, MPH [facility] at [facility] Pager- [phone] 08/20/2016 5:59 PM

## 2016-08-31 ENCOUNTER — Ambulatory Visit (INDEPENDENT_AMBULATORY_CARE_PROVIDER_SITE_OTHER): Payer: BLUE CROSS/BLUE SHIELD | Admitting: Family Medicine

## 2016-08-31 ENCOUNTER — Telehealth: Payer: Self-pay | Admitting: Family Medicine

## 2016-08-31 ENCOUNTER — Other Ambulatory Visit: Payer: Self-pay | Admitting: Family Medicine

## 2016-08-31 VITALS — BP 138/74 | HR 62 | Temp 98.6°F | Resp 12 | Wt 177.6 lb

## 2016-08-31 DIAGNOSIS — R300 Dysuria: Secondary | ICD-10-CM | POA: Diagnosis not present

## 2016-08-31 LAB — POCT URINALYSIS DIPSTICK
Bilirubin, UA: NEGATIVE
Blood, UA: NEGATIVE
Glucose, UA: NEGATIVE
KETONES UA: NEGATIVE
Nitrite, UA: NEGATIVE
PH UA: 5.5 (ref 5.0–8.0)
PROTEIN UA: NEGATIVE
Spec Grav, UA: <=1.005 — AB (ref 1.010–1.025)
Urobilinogen, UA: 0.2 E.U./dL

## 2016-08-31 MED ORDER — FOSFOMYCIN TROMETHAMINE 3 G PO PACK: 3.0000 g | PACK | Freq: Once | ORAL | 0 refills | Status: AC

## 2016-08-31 MED ORDER — NITROFURANTOIN MONOHYD MACRO 100 MG PO CAPS: 100.0000 mg | ORAL_CAPSULE | Freq: Two times a day (BID) | ORAL | 0 refills | Status: DC

## 2016-08-31 NOTE — Telephone Encounter (Signed)
Spoke with patient she states monurol is $40.00 if this the only medication she will take.  However if there is something cheaper .

## 2016-08-31 NOTE — Progress Notes (Signed)
Pre-visit discussion using our clinic review tool. No additional management support is needed unless otherwise documented below in the visit note.  

## 2016-08-31 NOTE — Telephone Encounter (Signed)
Pt called and wanted to know if there is something else that Dr. Lacinda Axon could send in for her. The medication that he called in is a little pricey. Please advise, thank you!  Call pt @ [phone]  Pharmacy - [id number] - [address], [place]

## 2016-08-31 NOTE — Assessment & Plan Note (Signed)
New acute problem.  Trace leukocytes on urinalysis. Classic symptoms of UTI. We discussed waiting on culture versus empiric treatment. Patient elected for the latter. Treating with fosfomycin.

## 2016-08-31 NOTE — Telephone Encounter (Signed)
Rx sent 

## 2016-08-31 NOTE — Progress Notes (Signed)
Subjective:  Patient ID: Leah [name], female    DOB: [birth year]  Age: 51 y.o. MRN: [id number]  CC: UTI  HPI:  51 year old female presents with concern for UTI.  Patient reports a one-day history of frequency, urgency, and dysuria. No associated fevers or chills. She does report back pain and pelvic pain. No flank pain. No known relieving factors. No medications or interventions tried. She states that she's had UTIs previously but I have no record of this. No other associated symptoms. No other complaints at this time.  Social Hx   Social History   Social History  . Marital status: Single    Spouse name: N/A  . Number of children: N/A  . Years of education: N/A   Social History Main Topics  . Smoking status: Former Smoker    Packs/day: 1.00    Years: 20.00    Quit date: 06/02/2006  . Smokeless tobacco: Never Used  . Alcohol use 1.2 - 1.8 oz/week    2 - 3 Standard drinks or equivalent per week     Comment: socially  . Drug use: No  . Sexual activity: Yes    Partners: Male   Other Topics Concern  . Not on file   Social History Narrative  . No narrative on file    Review of Systems  Constitutional: Negative for fever.  Genitourinary: Positive for dysuria, frequency and urgency. Negative for flank pain.   Objective:  BP 138/74 (BP Location: Left Arm, Patient Position: Sitting, Cuff Size: Normal)   Pulse 62   Temp 98.6 F (37 C) (Oral)   Resp 12   Wt 177 lb 9.6 oz (80.6 kg)   SpO2 97%   BMI 29.59 kg/m   BP/Weight 08/31/2016 08/19/2016 6/73/4193  Systolic BP 790 240 973  Diastolic BP 74 86 91  Wt. (Lbs) 177.6 180.13 182.1  BMI 29.59 30.01 30.34   Physical Exam  Constitutional: She is oriented to person, place, and time. She appears well-developed. No distress.  Cardiovascular: Normal rate and regular rhythm.   No murmur heard. Pulmonary/Chest: Effort normal and breath sounds normal. She has no wheezes. She has no rales.  Abdominal: Soft. She exhibits no  distension. There is no tenderness. There is no rebound and no guarding.  Neurological: She is alert and oriented to person, place, and time.  Psychiatric: She has a normal mood and affect.  Vitals reviewed.   Lab Results  Component Value Date   WBC 7.8 07/29/2016   HGB 15.9 07/29/2016   HCT 44.9 07/29/2016   PLT 352 07/29/2016   GLUCOSE 108 (H) 07/29/2016   CHOL 292 (H) 06/01/2016   TRIG 344.0 (H) 06/01/2016   HDL 51.10 06/01/2016   LDLDIRECT 156.0 06/01/2016   ALT 18 07/29/2016   AST 20 07/29/2016   NA 134 (L) 07/29/2016   K 3.9 07/29/2016   CL 104 07/29/2016   CREATININE 0.58 07/29/2016   BUN 14 07/29/2016   CO2 24 07/29/2016   TSH 3.38 06/01/2016   HGBA1C 5.1 06/01/2016   Results for orders placed or performed in visit on 08/31/16 (from the past 24 hour(s))  POCT Urinalysis Dipstick     Status: Abnormal   Collection Time: 08/31/16  9:56 AM  Result Value Ref Range   Color, UA yellow    Clarity, UA clear    Glucose, UA negative    Bilirubin, UA negative    Ketones, UA negative    Spec Grav, UA <=1.005 (A) 1.010 - 1.025  Blood, UA negative    pH, UA 5.5 5.0 - 8.0   Protein, UA negative    Urobilinogen, UA 0.2 0.2 or 1.0 E.U./dL   Nitrite, UA negative    Leukocytes, UA Trace (A) Negative    Assessment & Plan:   Problem List Items Addressed This Visit      Other   Dysuria - Primary    New acute problem.  Trace leukocytes on urinalysis. Classic symptoms of UTI. We discussed waiting on culture versus empiric treatment. Patient elected for the latter. Treating with fosfomycin.      Relevant Orders   POCT Urinalysis Dipstick (Completed)   Urine Culture     Meds ordered this encounter  Medications  . fosfomycin (MONUROL) 3 g PACK    Sig: Take 3 g by mouth once.    Dispense:  3 g    Refill:  0   Follow-up: PRN  [place]

## 2016-08-31 NOTE — Patient Instructions (Signed)
Antibiotic as prescribed.  Take care  Dr. Colleene Swarthout  

## 2016-09-01 ENCOUNTER — Other Ambulatory Visit: Payer: Self-pay | Admitting: Family Medicine

## 2016-09-01 LAB — URINE CULTURE

## 2016-09-02 NOTE — Telephone Encounter (Signed)
Please advise refill, thanks 

## 2016-09-24 ENCOUNTER — Emergency Department
Admission: EM | Admit: 2016-09-24 | Discharge: 2016-09-24 | Disposition: A | Discharge status: Home / Self Care | Payer: BLUE CROSS/BLUE SHIELD | Attending: Emergency Medicine | Admitting: Emergency Medicine

## 2016-09-24 ENCOUNTER — Encounter: Payer: Self-pay | Admitting: Emergency Medicine

## 2016-09-24 DIAGNOSIS — M25511 Pain in right shoulder: Secondary | ICD-10-CM | POA: Diagnosis present

## 2016-09-24 DIAGNOSIS — Z87891 Personal history of nicotine dependence: Secondary | ICD-10-CM | POA: Insufficient documentation

## 2016-09-24 DIAGNOSIS — J45909 Unspecified asthma, uncomplicated: Secondary | ICD-10-CM | POA: Diagnosis not present

## 2016-09-24 MED ORDER — KETOROLAC TROMETHAMINE 30 MG/ML IJ SOLN
30.0000 mg | Freq: Once | INTRAMUSCULAR | Status: AC
  Administered 2016-09-24: 30 mg via INTRAMUSCULAR
  Filled 2016-09-24: qty 1

## 2016-09-24 MED ORDER — HYDROCODONE-ACETAMINOPHEN 5-325 MG PO TABS
1.0000 | ORAL_TABLET | Freq: Once | ORAL | Status: AC
  Administered 2016-09-24: 1 via ORAL
  Filled 2016-09-24: qty 1

## 2016-09-24 MED ORDER — HYDROCODONE-ACETAMINOPHEN 5-325 MG PO TABS: ORAL_TABLET | ORAL | 0 refills | Status: DC

## 2016-09-24 NOTE — Discharge Instructions (Signed)
Follow-up with your primary care doctor if any continued problems. Ice or heat to your shoulder as needed for comfort. Continue taking methocarbamol and ibuprofen as directed. Norco one every 4-6 hours as needed for pain.

## 2016-09-24 NOTE — ED Triage Notes (Signed)
Pt states that she is having right shoulder pain that started yesterday. Pt states that she took her muscle relaxer's and they seemed to help but the pain pain back when they wore off.

## 2016-09-24 NOTE — ED Provider Notes (Signed)
[facility] Emergency Department Provider Note  ____________________________________________   First MD Initiated Contact with Patient 09/24/16 0725     (approximate)  I have reviewed the triage vital signs and the nursing notes.   HISTORY  Chief Complaint Shoulder Pain   HPI Leah [name] is a 51 y.o. female is here with complaint of right shoulder pain that began while she was sitting on the sofa yesterday. Patient states she took her "muscle relaxant" that seemed to help but the pain returned at approximately 3:30 AM. Patient took another muscle relaxant along with ibuprofen with improvement of her pain. She denies any injury to her shoulder. Patient is right-hand dominant. Patient states that she has to and some movement which involves lifting and twisting. Patient has a history of cervical "bulging disc". She denies any chest pain. Pain is increased with movement of her right shoulder. She rates her pain as a 7 out of 10.   Past Medical History:  Diagnosis Date  . Asthma   . Carpal tunnel syndrome    R hand   . Herniated disc, cervical   . Low back pain   . Migraines     Patient Active Problem List   Diagnosis Date Noted  . Dysuria 08/31/2016  . Elevated BP without diagnosis of hypertension 06/01/2016  . Asthma 06/01/2016  . Encounter for general adult medical examination with abnormal findings 06/01/2016    Past Surgical History:  Procedure Laterality Date  . CESAREAN SECTION    . ESOPHAGOGASTRODUODENOSCOPY    . THROAT SURGERY  2015  . UMBILICAL HERNIA REPAIR  2006    x 2   w mesh per pt    Prior to Admission medications   Medication Sig Start Date End Date Taking? Authorizing Provider  HYDROcodone-acetaminophen (NORCO/VICODIN) 5-325 MG tablet 1 tab q 4-6 hours prn pain 09/24/16   Letitia Neri L, PA-C  ibuprofen (ADVIL,MOTRIN) 800 MG tablet TAKE 1 TABLET (800 MG TOTAL) BY MOUTH EVERY 8 (EIGHT) HOURS AS NEEDED. 09/02/16   Coral Spikes,  DO  methocarbamol (ROBAXIN) 750 MG tablet Take 1 tablet (750 mg total) by mouth every 6 (six) hours as needed for muscle spasms. 07/06/16   Coral Spikes, DO  nitrofurantoin, macrocrystal-monohydrate, (MACROBID) 100 MG capsule Take 1 capsule (100 mg total) by mouth 2 (two) times daily. 08/31/16   Coral Spikes, DO    Allergies Penicillins and Sulfa antibiotics  Family History  Problem Relation Age of Onset  . Breast cancer Mother   . Thyroid disease Mother   . Prostate cancer Father   . Cancer - Prostate Father   . Breast cancer Maternal Grandmother     Social History Social History  Substance Use Topics  . Smoking status: Former Smoker    Packs/day: 1.00    Years: 20.00    Quit date: 06/02/2006  . Smokeless tobacco: Never Used  . Alcohol use 1.2 - 1.8 oz/week    2 - 3 Standard drinks or equivalent per week     Comment: socially    Review of Systems Constitutional: No fever/chills Cardiovascular: Denies chest pain. Respiratory: Denies shortness of breath. Gastrointestinal:  No nausea, no vomiting.  Musculoskeletal: Right shoulder pain. Skin: Negative for rash. Neurological: Negative for headaches, focal weakness or numbness.   ____________________________________________   PHYSICAL EXAM:  VITAL SIGNS: ED Triage Vitals  Enc Vitals Group     BP 09/24/16 0728 127/77     Pulse Rate 09/24/16 0728 65  Resp 09/24/16 0728 14     Temp 09/24/16 0728 98.4 F (36.9 C)     Temp Source 09/24/16 0728 Oral     SpO2 09/24/16 0728 98 %     Weight 09/24/16 0730 172 lb (78 kg)     Height 09/24/16 0730 5\' 3"  (1.6 m)     Head Circumference --      Peak Flow --      Pain Score 09/24/16 0721 8     Pain Loc --      Pain Edu? --      Excl. in [place]? --     Constitutional: Alert and oriented. Well appearing and in no acute distress. Eyes: Conjunctivae are normal. PERRL. EOMI. Head: Atraumatic. Neck: No stridor.   Cardiovascular: Normal rate, regular rhythm. Grossly normal heart  sounds.  Good peripheral circulation. Respiratory: Normal respiratory effort.  No retractions. Lungs CTAB. Musculoskeletal: On examination of the right shoulder there is no gross deformity. There is no soft tissue swelling. There is marked tenderness on palpation of the parascapular muscles. Pain is increased and reproduced with range of motion of the right arm. No skin discoloration or abrasions were noted. Neurologic:  Normal speech and language. No gross focal neurologic deficits are appreciated.  Skin:  Skin is warm, dry and intact. No rash noted. Psychiatric: Mood and affect are normal. Speech and behavior are normal.  ____________________________________________   LABS (all labs ordered are listed, but only abnormal results are displayed)  Labs Reviewed - No data to display  PROCEDURES  Procedure(s) performed: None  Procedures  Critical Care performed: No  ____________________________________________   INITIAL IMPRESSION / ASSESSMENT AND PLAN / ED COURSE  Pertinent labs & imaging results that were available during my care of the patient were reviewed by me and considered in my medical decision making (see chart for details).  Patient was given Toradol 30 mg IM in the ED along with Norco one by mouth. Patient is continue taking her methocarbamol as directed. She was given a prescription for Norco as needed for pain and she is to continue taking her ibuprofen that she has a home. She is encouraged to use ice or heat to her shoulder for comfort. She is follow-up with her PCP if any continued problems.      ____________________________________________   FINAL CLINICAL IMPRESSION(S) / ED DIAGNOSES  Final diagnoses:  Acute pain of right shoulder      NEW MEDICATIONS STARTED DURING THIS VISIT:  Discharge Medication List as of 09/24/2016  8:07 AM    START taking these medications   Details  HYDROcodone-acetaminophen (NORCO/VICODIN) 5-325 MG tablet 1 tab q 4-6 hours  prn pain, Print         Note:  This document was prepared using Dragon voice recognition software and may include unintentional dictation errors.    Johnn Hai, PA-C 09/24/16 1000    Clearnce Hasten Randall An, MD 09/24/16 [phone]

## 2016-10-07 ENCOUNTER — Ambulatory Visit
Admission: RE | Admit: 2016-10-07 | Discharge: 2016-10-07 | Disposition: A | Discharge status: Home / Self Care | Payer: BLUE CROSS/BLUE SHIELD | Source: Ambulatory Visit | Attending: Family Medicine | Admitting: Family Medicine

## 2016-10-07 ENCOUNTER — Telehealth: Payer: Self-pay | Admitting: Family Medicine

## 2016-10-07 DIAGNOSIS — Z1231 Encounter for screening mammogram for malignant neoplasm of breast: Secondary | ICD-10-CM | POA: Insufficient documentation

## 2016-10-07 DIAGNOSIS — N6002 Solitary cyst of left breast: Secondary | ICD-10-CM | POA: Diagnosis present

## 2016-10-07 DIAGNOSIS — Z1239 Encounter for other screening for malignant neoplasm of breast: Secondary | ICD-10-CM

## 2016-10-07 NOTE — Telephone Encounter (Signed)
Pt called about needing a referral to get her mammo for next year 09/2017. Please and thank you!

## 2016-10-08 ENCOUNTER — Other Ambulatory Visit: Payer: Self-pay | Admitting: Family Medicine

## 2016-10-08 DIAGNOSIS — M62838 Other muscle spasm: Secondary | ICD-10-CM

## 2016-10-10 NOTE — Telephone Encounter (Addendum)
Patient advised there will be a reminder letter mailed out from imaging facility

## 2016-10-18 ENCOUNTER — Ambulatory Visit (INDEPENDENT_AMBULATORY_CARE_PROVIDER_SITE_OTHER): Payer: BLUE CROSS/BLUE SHIELD | Admitting: Family Medicine

## 2016-10-18 ENCOUNTER — Encounter: Payer: Self-pay | Admitting: Family Medicine

## 2016-10-18 ENCOUNTER — Ambulatory Visit
Admission: RE | Admit: 2016-10-18 | Discharge: 2016-10-18 | Disposition: A | Discharge status: Home / Self Care | Payer: BLUE CROSS/BLUE SHIELD | Source: Ambulatory Visit | Attending: Family Medicine | Admitting: Family Medicine

## 2016-10-18 VITALS — BP 126/70 | HR 104 | Temp 102.5°F | Wt 167.2 lb

## 2016-10-18 DIAGNOSIS — R300 Dysuria: Secondary | ICD-10-CM

## 2016-10-18 DIAGNOSIS — R109 Unspecified abdominal pain: Secondary | ICD-10-CM | POA: Diagnosis not present

## 2016-10-18 DIAGNOSIS — N200 Calculus of kidney: Secondary | ICD-10-CM | POA: Diagnosis not present

## 2016-10-18 DIAGNOSIS — R509 Fever, unspecified: Secondary | ICD-10-CM | POA: Diagnosis not present

## 2016-10-18 LAB — POC URINALSYSI DIPSTICK (AUTOMATED)
Bilirubin, UA: NEGATIVE
Glucose, UA: NEGATIVE
KETONES UA: NEGATIVE
Nitrite, UA: NEGATIVE
PH UA: 5.5 (ref 5.0–8.0)
PROTEIN UA: 100
SPEC GRAV UA: 1.015 (ref 1.010–1.025)
Urobilinogen, UA: 0.2 E.U./dL

## 2016-10-18 LAB — COMPREHENSIVE METABOLIC PANEL
ALBUMIN: 3.8 g/dL (ref 3.5–5.2)
ALK PHOS: 54 U/L (ref 39–117)
ALT: 19 U/L (ref 0–35)
AST: 19 U/L (ref 0–37)
BUN: 13 mg/dL (ref 6–23)
CHLORIDE: 100 meq/L (ref 96–112)
CO2: 27 mEq/L (ref 19–32)
CREATININE: 0.71 mg/dL (ref 0.40–1.20)
Calcium: 9.1 mg/dL (ref 8.4–10.5)
GFR: 93.11 mL/min (ref >60.00)
Glucose, Bld: 103 mg/dL — ABNORMAL HIGH (ref 70–99)
POTASSIUM: 3.3 meq/L — AB (ref 3.5–5.1)
Sodium: 134 mEq/L — ABNORMAL LOW (ref 135–145)
TOTAL PROTEIN: 7.2 g/dL (ref 6.0–8.3)
Total Bilirubin: 0.6 mg/dL (ref 0.2–1.2)

## 2016-10-18 LAB — CBC
HEMATOCRIT: 39.2 % (ref 36.0–46.0)
Hemoglobin: 13.6 g/dL (ref 12.0–15.0)
MCHC: 34.7 g/dL (ref 30.0–36.0)
MCV: 91 fl (ref 78.0–100.0)
Platelets: 283 10*3/uL (ref 150.0–400.0)
RBC: 4.31 Mil/uL (ref 3.87–5.11)
RDW: 12.3 % (ref 11.5–15.5)
WBC: 14 10*3/uL — ABNORMAL HIGH (ref 4.0–10.5)

## 2016-10-18 MED ORDER — CIPROFLOXACIN HCL 500 MG PO TABS
500.0000 mg | ORAL_TABLET | Freq: Two times a day (BID) | ORAL | 0 refills | Status: DC
Start: 2016-10-18 — End: 2017-02-17

## 2016-10-18 NOTE — Progress Notes (Signed)
Subjective:  Patient ID: Leah [name], female    DOB: 1965-07-25  Age: 51 y.o. MRN: [id number]  CC: Flank pain, fever, chills, dysuria  HPI:  51 year old female presents with the above complaints.  Patient states that she has not been feeling well since Friday or Saturday. She is not sure of the exact date. She states that she began to have right-sided back pain and flank pain radiating to the groin. Associated fever and chills. Pain moderate to severe. Associated dysuria. She reports some vague tingling as well. She used ibuprofen and a muscle relaxer without any improvement. Additionally, she has had one episode of emesis. No respiratory symptoms. No reports of abdominal pain. No known exacerbating factors. No other associated symptoms. No other complaints or concerns at this time.  Social Hx   Social History   Social History  . Marital status: Single    Spouse name: N/A  . Number of children: N/A  . Years of education: N/A   Social History Main Topics  . Smoking status: Former Smoker    Packs/day: 1.00    Years: 20.00    Quit date: 06/02/2006  . Smokeless tobacco: Never Used  . Alcohol use 1.2 - 1.8 oz/week    2 - 3 Standard drinks or equivalent per week     Comment: socially  . Drug use: No  . Sexual activity: Yes    Partners: Male   Other Topics Concern  . None   Social History Narrative  . None    Review of Systems  Constitutional: Positive for chills and fever.  Genitourinary: Positive for dysuria and flank pain.   Objective:  BP 126/70 (BP Location: Left Arm, Patient Position: Sitting, Cuff Size: Normal)   Pulse (!) 104   Temp (!) 102.5 F (39.2 C) (Oral)   Wt 167 lb 4 oz (75.9 kg)   SpO2 99%   BMI 29.63 kg/m   BP/Weight 10/18/2016 09/24/2016 2/29/7989  Systolic BP 211 941 740  Diastolic BP 70 77 74  Wt. (Lbs) 167.25 172 177.6  BMI 29.63 30.47 29.59   Physical Exam  Constitutional: She is oriented to person, place, and time. She appears  well-developed. No distress.  HENT:  Head: Normocephalic and atraumatic.  Eyes: Conjunctivae are normal. No scleral icterus.  Cardiovascular:  Tachycardic. Regular rhythm. No murmur.  Pulmonary/Chest: Effort normal and breath sounds normal. She has no wheezes. She has no rales.  Abdominal:  Soft, nondistended. CVA tenderness on the right. Right upper quadrant pain. No rebound or guarding.  Neurological: She is alert and oriented to person, place, and time.  Psychiatric: She has a normal mood and affect.  Vitals reviewed.  Results for orders placed or performed in visit on 10/18/16 (from the past 24 hour(s))  POCT Urinalysis Dipstick (Automated)     Status: Abnormal   Collection Time: 10/18/16  1:29 PM  Result Value Ref Range   Color, UA yellow    Clarity, UA cloudy    Glucose, UA negative    Bilirubin, UA negative    Ketones, UA negative    Spec Grav, UA 1.015 1.010 - 1.025   Blood, UA moderate    pH, UA 5.5 5.0 - 8.0   Protein, UA 100    Urobilinogen, UA 0.2 0.2 or 1.0 E.U./dL   Nitrite, UA negative    Leukocytes, UA Large (3+) (A) Negative   Ct Abdomen Pelvis Wo Contrast  Result Date: 10/18/2016 CLINICAL DATA:  Right-sided flank pain. Fever and chills  for 4 days. EXAM: CT ABDOMEN AND PELVIS WITHOUT CONTRAST TECHNIQUE: Multidetector CT imaging of the abdomen and pelvis was performed following the standard protocol without IV contrast. COMPARISON:  None. FINDINGS: Lower chest: The lung bases are clear without focal nodule, mass, or airspace disease. The heart size is normal. No significant pleural or pericardial effusion is present. Hepatobiliary: No focal liver abnormality is seen. No gallstones, gallbladder wall thickening, or biliary dilatation. Pancreas: Unremarkable. No pancreatic ductal dilatation or surrounding inflammatory changes. Spleen: Normal size. No focal lesions are present. A 12 mm splenule is noted near the lower pole. Adrenals/Urinary Tract: The adrenal glands are  normal bilaterally. Two 6 mm nonobstructing stones are present at the lower pole of the left kidney. There is some stranding Ing indistinctness of the right kidney. There is no hydronephrosis. No discrete lesion is present. There is mild dilation of the right ureter without a focal obstructing lesion. The urinary bladder is within normal limits. Stomach/Bowel: The stomach and duodenum are within normal limits. The small bowel is unremarkable. The appendix is visualized and normal. The ascending and transverse colon are within normal limits. Mild diverticular changes are present in the distal descending proximal sigmoid colon without an inflammatory change. The rectum is unremarkable. Vascular/Lymphatic: Minimal atherosclerotic calcifications are present. There is no aneurysm. No significant retroperitoneal or pelvic adenopathy is present. Reproductive: Uterus and adnexae are within normal limits. A metallic density is present in what appears to be the fallopian tube on the right. Other: Minimal free fluid is likely physiologic. There is no free air. No significant ventral hernia is present. Musculoskeletal: Tear body heights and alignment are maintained. No focal lytic or blastic lesions are present. The bony pelvis is within normal limits. The hips are unremarkable. Facet degenerative changes are noted in the lower lumbar spine, particularly at L4-5. IMPRESSION: 1. Inflammatory changes about the right kidney and ureter. This may be related to unilateral pyelonephritis. Passage of the recent stone could give a similar appearance. No stone is identified within the urinary bladder. No other right-sided renal stones are present. There is no hydronephrosis. 2. 2 nonobstructing 6 mm stones are present at the lower pole of the left kidney. The left kidney is otherwise unremarkable. 3. No other acute or focal abnormality. Incidental findings as above. Electronically Signed   By: San Morelle M.D.   On: 10/18/2016  14:26   Assessment & Plan:   Problem List Items Addressed This Visit    Flank pain - Primary    New acute problem. Associated fever, chills, vomiting. Clinically appears to have pyelonephritis. I'm concerned that she may have an obstructing stone given flank pain which radiates to the groin.  Stat labs. CT of the abdomen and pelvis revealed findings consistent with pyelonephritis. No obstructing stones. Treating with Cipro.      Relevant Orders   POCT Urinalysis Dipstick (Automated) (Completed)   CT Abdomen Pelvis Wo Contrast (Completed)   CBC   Comprehensive metabolic panel   Urine Culture    Other Visit Diagnoses    Fever, unspecified fever cause       Relevant Orders   CT Abdomen Pelvis Wo Contrast (Completed)     Follow-up: PRN  [place]

## 2016-10-18 NOTE — Assessment & Plan Note (Addendum)
New acute problem. Associated fever, chills, vomiting. Clinically appears to have pyelonephritis. I'm concerned that she may have an obstructing stone given flank pain which radiates to the groin.  Stat labs. CT of the abdomen and pelvis revealed findings consistent with pyelonephritis. No obstructing stones. Treating with Cipro.

## 2016-10-18 NOTE — Patient Instructions (Signed)
I will call with the results.  Take care  Dr. Zala Degrasse  

## 2016-10-19 ENCOUNTER — Encounter: Payer: Self-pay | Admitting: Family Medicine

## 2016-10-19 ENCOUNTER — Other Ambulatory Visit: Payer: Self-pay | Admitting: Family Medicine

## 2016-10-19 MED ORDER — POTASSIUM CHLORIDE CRYS ER 20 MEQ PO TBCR: 40.0000 meq | EXTENDED_RELEASE_TABLET | Freq: Two times a day (BID) | ORAL | 0 refills | Status: DC

## 2016-10-20 ENCOUNTER — Encounter: Payer: Self-pay | Admitting: Emergency Medicine

## 2016-10-20 ENCOUNTER — Other Ambulatory Visit: Payer: Self-pay | Admitting: Family Medicine

## 2016-10-20 ENCOUNTER — Emergency Department
Admission: EM | Admit: 2016-10-20 | Discharge: 2016-10-20 | Disposition: A | Discharge status: Home / Self Care | Payer: BLUE CROSS/BLUE SHIELD | Attending: Emergency Medicine | Admitting: Emergency Medicine

## 2016-10-20 DIAGNOSIS — N111 Chronic obstructive pyelonephritis: Secondary | ICD-10-CM | POA: Insufficient documentation

## 2016-10-20 DIAGNOSIS — Z87891 Personal history of nicotine dependence: Secondary | ICD-10-CM | POA: Insufficient documentation

## 2016-10-20 DIAGNOSIS — R109 Unspecified abdominal pain: Secondary | ICD-10-CM | POA: Diagnosis present

## 2016-10-20 DIAGNOSIS — J45909 Unspecified asthma, uncomplicated: Secondary | ICD-10-CM | POA: Insufficient documentation

## 2016-10-20 DIAGNOSIS — Z79899 Other long term (current) drug therapy: Secondary | ICD-10-CM | POA: Diagnosis not present

## 2016-10-20 DIAGNOSIS — N12 Tubulo-interstitial nephritis, not specified as acute or chronic: Secondary | ICD-10-CM

## 2016-10-20 LAB — URINALYSIS, COMPLETE (UACMP) WITH MICROSCOPIC
Glucose, UA: NEGATIVE mg/dL
Ketones, ur: NEGATIVE mg/dL
NITRITE: NEGATIVE
PH: 5 (ref 5.0–8.0)
Protein, ur: 100 mg/dL — AB
SPECIFIC GRAVITY, URINE: 1.026 (ref 1.005–1.030)

## 2016-10-20 LAB — BASIC METABOLIC PANEL
Anion gap: 11 (ref 5–15)
BUN: 14 mg/dL (ref 6–20)
CO2: 22 mmol/L (ref 22–32)
CREATININE: 0.77 mg/dL (ref 0.44–1.00)
Calcium: 8.9 mg/dL (ref 8.9–10.3)
Chloride: 103 mmol/L (ref 101–111)
GFR calc Af Amer: >60 mL/min (ref >60)
GFR calc non Af Amer: >60 mL/min (ref >60)
Glucose, Bld: 159 mg/dL — ABNORMAL HIGH (ref 65–99)
Potassium: 3.1 mmol/L — ABNORMAL LOW (ref 3.5–5.1)
SODIUM: 136 mmol/L (ref 135–145)

## 2016-10-20 LAB — CBC
HCT: 37.8 % (ref 35.0–47.0)
Hemoglobin: 13.3 g/dL (ref 12.0–16.0)
MCH: 30.9 pg (ref 26.0–34.0)
MCHC: 35.2 g/dL (ref 32.0–36.0)
MCV: 87.8 fL (ref 80.0–100.0)
PLATELETS: 303 10*3/uL (ref 150–440)
RBC: 4.3 MIL/uL (ref 3.80–5.20)
RDW: 12.4 % (ref 11.5–14.5)
WBC: 21.6 10*3/uL — ABNORMAL HIGH (ref 3.6–11.0)

## 2016-10-20 MED ORDER — KETOROLAC TROMETHAMINE 30 MG/ML IJ SOLN
15.0000 mg | Freq: Once | INTRAMUSCULAR | Status: AC
Start: 2016-10-20 — End: 2016-10-20
  Administered 2016-10-20: 15 mg via INTRAVENOUS
  Filled 2016-10-20: qty 1

## 2016-10-20 MED ORDER — TRAMADOL HCL 50 MG PO TABS: 50.0000 mg | ORAL_TABLET | Freq: Three times a day (TID) | ORAL | 0 refills | Status: DC | PRN

## 2016-10-20 MED ORDER — SODIUM CHLORIDE 0.9 % IV BOLUS (SEPSIS): 1000.0000 mL | Freq: Once | INTRAVENOUS | Status: DC

## 2016-10-20 MED ORDER — ONDANSETRON HCL 4 MG PO TABS: 4.0000 mg | ORAL_TABLET | Freq: Three times a day (TID) | ORAL | 0 refills | Status: DC | PRN

## 2016-10-20 MED ORDER — ONDANSETRON HCL 4 MG/2ML IJ SOLN
4.0000 mg | Freq: Once | INTRAMUSCULAR | Status: AC
  Administered 2016-10-20: 4 mg via INTRAVENOUS
  Filled 2016-10-20: qty 2

## 2016-10-20 MED ORDER — CEPHALEXIN 500 MG PO CAPS: 500.0000 mg | ORAL_CAPSULE | Freq: Four times a day (QID) | ORAL | 0 refills | Status: AC

## 2016-10-20 MED ORDER — SODIUM CHLORIDE 0.9 % IV BOLUS (SEPSIS)
1000.0000 mL | Freq: Once | INTRAVENOUS | Status: AC
  Administered 2016-10-20: 1000 mL via INTRAVENOUS

## 2016-10-20 MED ORDER — IBUPROFEN 600 MG PO TABS: 600.0000 mg | ORAL_TABLET | Freq: Four times a day (QID) | ORAL | 0 refills | Status: DC | PRN

## 2016-10-20 MED ORDER — DEXTROSE 5 % IV SOLN
1.0000 g | Freq: Once | INTRAVENOUS | Status: AC
  Administered 2016-10-20: 1 g via INTRAVENOUS
  Filled 2016-10-20 (×2): qty 10

## 2016-10-20 NOTE — Telephone Encounter (Signed)
Spoke with patient , and she has the heat on in the house .  She has not taken temperature due to she doesn't have thermometer.  She is taking ibuprofen 800 mg 2-3 hours ago.   Still having some dysuria , some low back pain and , nauseated still pushing fluids and urine cloudy.

## 2016-10-20 NOTE — ED Triage Notes (Signed)
Pt c/o right flank pain and RLQ pain.  Pt did take 800 mg motrin 11 am today.  Currently on cipro for UTI. Also has kidney stones.  Has had some vomiting.  Is not getting any better on abx.  Sent for IV abx

## 2016-10-20 NOTE — Discharge Instructions (Signed)
Stop Cipro and take Keflex instead 4 times a day for 10 days. If you're not feeling better within 24 hours, if you're having fevers, worsening abdominal or flank pain please return to the emergency room. Otherwise follow-up with her primary care doctor in 2 days.

## 2016-10-20 NOTE — ED Provider Notes (Signed)
[facility] Emergency Department Provider Note  ____________________________________________  Time seen: Approximately 6:35 PM  I have reviewed the triage vital signs and the nursing notes.   HISTORY  Chief Complaint Flank Pain   HPI Leah [name] is a 51 y.o. female who presents for evaluation of the right flank pain and right lower quadrant pain. Patient was seen by her primary care doctor 2 days ago for the same complaints and was diagnosed with pyelonephritis. She had a CT abdomen and pelvis negative for appendicitis or kidney stone. She was started on Cipro. Patient reports that she continues to have pain in her right flank, continues to have dysuria, nausea, chills, and vomiting. She feels like her symptoms are not getting any better. She is currently endorses 6 out of 10 throbbing pain located in her left flank that has been constant and nonradiating.  Past Medical History:  Diagnosis Date  . Asthma   . Carpal tunnel syndrome    R hand   . Herniated disc, cervical   . Low back pain   . Migraines     Patient Active Problem List   Diagnosis Date Noted  . Flank pain 10/18/2016  . Elevated BP without diagnosis of hypertension 06/01/2016  . Asthma 06/01/2016    Past Surgical History:  Procedure Laterality Date  . CESAREAN SECTION    . ESOPHAGOGASTRODUODENOSCOPY    . THROAT SURGERY  2015  . UMBILICAL HERNIA REPAIR  2006    x 2   w mesh per pt    Prior to Admission medications   Medication Sig Start Date End Date Taking? Authorizing Provider  cephALEXin (KEFLEX) 500 MG capsule Take 1 capsule (500 mg total) by mouth 4 (four) times daily. 10/20/16 10/30/16  Rudene Re, MD  ciprofloxacin (CIPRO) 500 MG tablet Take 1 tablet (500 mg total) by mouth 2 (two) times daily. 10/18/16   Coral Spikes, DO  ibuprofen (ADVIL,MOTRIN) 600 MG tablet Take 1 tablet (600 mg total) by mouth every 6 (six) hours as needed. 10/20/16   Alfred Levins, [place], MD    methocarbamol (ROBAXIN) 750 MG tablet TAKE 1 TABLET (750 MG TOTAL) BY MOUTH EVERY 6 (SIX) HOURS AS NEEDED FOR MUSCLE SPASMS. 10/10/16   Coral Spikes, DO  ondansetron (ZOFRAN) 4 MG tablet Take 1 tablet (4 mg total) by mouth every 8 (eight) hours as needed for nausea or vomiting. 10/20/16   Alfred Levins, [place], MD  potassium chloride SA (K-DUR,KLOR-CON) 20 MEQ tablet Take 2 tablets (40 mEq total) by mouth 2 (two) times daily. 10/19/16   Coral Spikes, DO  traMADol (ULTRAM) 50 MG tablet Take 1 tablet (50 mg total) by mouth every 8 (eight) hours as needed. 10/20/16   Coral Spikes, DO    Allergies Penicillins and Sulfa antibiotics  Family History  Problem Relation Age of Onset  . Breast cancer Mother   . Thyroid disease Mother   . Prostate cancer Father   . Cancer - Prostate Father   . Breast cancer Maternal Grandmother     Social History Social History  Substance Use Topics  . Smoking status: Former Smoker    Packs/day: 1.00    Years: 20.00    Quit date: 06/02/2006  . Smokeless tobacco: Never Used  . Alcohol use 1.2 - 1.8 oz/week    2 - 3 Standard drinks or equivalent per week     Comment: socially    Review of Systems  Constitutional: Negative for fever. Eyes: Negative for  visual changes. ENT: Negative for sore throat. Neck: No neck pain  Cardiovascular: Negative for chest pain. Respiratory: Negative for shortness of breath. Gastrointestinal: + R sided abdominal pain. + N/V Genitourinary: + dysuria and R flank pain Musculoskeletal: Negative for back pain. Skin: Negative for rash. Neurological: Negative for headaches, weakness or numbness. Psych: No SI or HI  ____________________________________________   PHYSICAL EXAM:  VITAL SIGNS: ED Triage Vitals  Enc Vitals Group     BP 10/20/16 1508 115/65     Pulse Rate 10/20/16 1508 (!) 115     Resp 10/20/16 1508 18     Temp 10/20/16 1508 99.3 F (37.4 C)     Temp Source 10/20/16 1508 Oral     SpO2 10/20/16 1508 97 %      Weight 10/20/16 1508 167 lb (75.8 kg)     Height 10/20/16 1508 5\' 3"  (1.6 m)     Head Circumference --      Peak Flow --      Pain Score 10/20/16 1507 9     Pain Loc --      Pain Edu? --      Excl. in [place]? --     Constitutional: Alert and oriented. Well appearing and in no apparent distress. HEENT:      Head: Normocephalic and atraumatic.         Eyes: Conjunctivae are normal. Sclera is non-icteric.       Mouth/Throat: Mucous membranes are moist.       Neck: Supple with no signs of meningismus. Cardiovascular: Tachycardic with regular rhythm. No murmurs, gallops, or rubs. 2+ symmetrical distal pulses are present in all extremities. No JVD. Respiratory: Normal respiratory effort. Lungs are clear to auscultation bilaterally. No wheezes, crackles, or rhonchi.  Gastrointestinal: Soft, mild ttp over the R quadrants, and non distended with positive bowel sounds. No rebound or guarding. Genitourinary: R CVA tenderness. Musculoskeletal: Nontender with normal range of motion in all extremities. No edema, cyanosis, or erythema of extremities. Neurologic: Normal speech and language. Face is symmetric. Moving all extremities. No gross focal neurologic deficits are appreciated. Skin: Skin is warm, dry and intact. No rash noted. Psychiatric: Mood and affect are normal. Speech and behavior are normal.  ____________________________________________   LABS (all labs ordered are listed, but only abnormal results are displayed)  Labs Reviewed  URINALYSIS, COMPLETE (UACMP) WITH MICROSCOPIC - Abnormal; Notable for the following:       Result Value   Color, Urine AMBER (*)    APPearance CLOUDY (*)    Hgb urine dipstick MODERATE (*)    Bilirubin Urine MODERATE (*)    Protein, ur 100 (*)    Leukocytes, UA MODERATE (*)    Bacteria, UA FEW (*)    Squamous Epithelial / LPF 6-30 (*)    All other components within normal limits  BASIC METABOLIC PANEL - Abnormal; Notable for the following:    Potassium  3.1 (*)    Glucose, Bld 159 (*)    All other components within normal limits  CBC - Abnormal; Notable for the following:    WBC 21.6 (*)    All other components within normal limits  URINE CULTURE   ____________________________________________  EKG  none  ____________________________________________  RADIOLOGY  none  ____________________________________________   PROCEDURES  Procedure(s) performed: None Procedures Critical Care performed:  None ____________________________________________   INITIAL IMPRESSION / ASSESSMENT AND PLAN / ED COURSE  51 y.o. female who presents for evaluation of the right flank pain and  right lower quadrant pain x 2 days. CT done 2 days ago concerning for pyelo. Patient's UA continues to be positive, low grade fever, tachycardic and worsening leukocytosis. No result of culture done 2 days but I believe failure to cipro at this time. CT with no appendicitis or kidney stone. Patient given IVF, IV rocephin and toradol and feels markedly improved with normalization of VS. Plan to switch patient to keflex per ID recs, close f/u with PCP. Recommended the patient returns to the emergency room if her symptoms are not improved within 24 hours, if she has fever, persistent pain, persistent nausea and vomiting.       Pertinent labs & imaging results that were available during my care of the patient were reviewed by me and considered in my medical decision making (see chart for details).    ____________________________________________   FINAL CLINICAL IMPRESSION(S) / ED DIAGNOSES  Final diagnoses:  Pyelonephritis      NEW MEDICATIONS STARTED DURING THIS VISIT:  New Prescriptions   CEPHALEXIN (KEFLEX) 500 MG CAPSULE    Take 1 capsule (500 mg total) by mouth 4 (four) times daily.   IBUPROFEN (ADVIL,MOTRIN) 600 MG TABLET    Take 1 tablet (600 mg total) by mouth every 6 (six) hours as needed.   ONDANSETRON (ZOFRAN) 4 MG TABLET    Take 1 tablet (4 mg  total) by mouth every 8 (eight) hours as needed for nausea or vomiting.     Note:  This document was prepared using Dragon voice recognition software and may include unintentional dictation errors.    Alfred Levins, [place], MD 10/20/16 [phone]

## 2016-10-21 LAB — URINE CULTURE

## 2016-10-23 LAB — URINE CULTURE: Culture: 60000 — AB

## 2016-10-25 ENCOUNTER — Encounter: Payer: Self-pay | Admitting: Family Medicine

## 2016-11-05 ENCOUNTER — Other Ambulatory Visit: Payer: Self-pay | Admitting: Family Medicine

## 2016-11-05 DIAGNOSIS — M62838 Other muscle spasm: Secondary | ICD-10-CM

## 2016-11-18 ENCOUNTER — Inpatient Hospital Stay: Payer: BLUE CROSS/BLUE SHIELD | Attending: Oncology

## 2016-11-18 DIAGNOSIS — D751 Secondary polycythemia: Secondary | ICD-10-CM | POA: Insufficient documentation

## 2016-11-18 LAB — CBC
HCT: 39.5 % (ref 35.0–47.0)
Hemoglobin: 14.3 g/dL (ref 12.0–16.0)
MCH: 32.1 pg (ref 26.0–34.0)
MCHC: 36.3 g/dL — AB (ref 32.0–36.0)
MCV: 88.5 fL (ref 80.0–100.0)
PLATELETS: 275 10*3/uL (ref 150–440)
RBC: 4.46 MIL/uL (ref 3.80–5.20)
RDW: 13.5 % (ref 11.5–14.5)
WBC: 5.8 10*3/uL (ref 3.6–11.0)

## 2016-12-08 ENCOUNTER — Other Ambulatory Visit: Payer: Self-pay | Admitting: Family Medicine

## 2016-12-08 DIAGNOSIS — M62838 Other muscle spasm: Secondary | ICD-10-CM

## 2016-12-12 ENCOUNTER — Other Ambulatory Visit: Payer: Self-pay | Admitting: Family Medicine

## 2016-12-12 NOTE — Telephone Encounter (Signed)
Please find out what she takes this for.  Thanks. 

## 2016-12-12 NOTE — Telephone Encounter (Signed)
Please advise for refill, thanks 

## 2016-12-13 MED ORDER — IBUPROFEN 800 MG PO TABS: 800.0000 mg | ORAL_TABLET | Freq: Three times a day (TID) | ORAL | 0 refills | Status: DC | PRN

## 2016-12-13 NOTE — Telephone Encounter (Signed)
Sent to pharmacy 

## 2017-01-10 ENCOUNTER — Other Ambulatory Visit: Payer: Self-pay

## 2017-01-10 NOTE — Telephone Encounter (Signed)
Please find out what she takes this for.  Thanks. 

## 2017-01-10 NOTE — Telephone Encounter (Signed)
patient requests 90 days Last OV 10/18/16 with Dr.Cook last filled 12/13/16 90 0rf Patient has appmt to establish with DR.Mclean

## 2017-01-11 ENCOUNTER — Other Ambulatory Visit: Payer: Self-pay | Admitting: Family Medicine

## 2017-01-12 NOTE — Telephone Encounter (Signed)
Patient has an appointment to establish with new PCP Dr. Terese Door  On 02/10/17 ok to fill til appointment?

## 2017-01-18 MED ORDER — IBUPROFEN 800 MG PO TABS: 800.0000 mg | ORAL_TABLET | Freq: Three times a day (TID) | ORAL | 0 refills | Status: DC | PRN

## 2017-01-18 NOTE — Telephone Encounter (Signed)
Patient states she was diagnosed with herniated disk by a doctor in [place] and was given this Same for robaxin.

## 2017-01-22 ENCOUNTER — Other Ambulatory Visit: Payer: Self-pay | Admitting: Family Medicine

## 2017-01-22 DIAGNOSIS — M62838 Other muscle spasm: Secondary | ICD-10-CM

## 2017-01-23 NOTE — Telephone Encounter (Signed)
Patient has an appointment with Dr. Aundra Dubin on 1130/18 can refill Robaxin til appointment?

## 2017-02-10 ENCOUNTER — Ambulatory Visit: Payer: BLUE CROSS/BLUE SHIELD | Admitting: Internal Medicine

## 2017-02-17 ENCOUNTER — Other Ambulatory Visit: Payer: Self-pay

## 2017-02-17 ENCOUNTER — Inpatient Hospital Stay: Payer: BLUE CROSS/BLUE SHIELD | Attending: Oncology | Admitting: Oncology

## 2017-02-17 ENCOUNTER — Inpatient Hospital Stay: Payer: BLUE CROSS/BLUE SHIELD

## 2017-02-17 ENCOUNTER — Encounter: Payer: Self-pay | Admitting: Oncology

## 2017-02-17 VITALS — BP 122/79 | HR 75 | Temp 97.4°F | Resp 18 | Wt 144.6 lb

## 2017-02-17 DIAGNOSIS — J45909 Unspecified asthma, uncomplicated: Secondary | ICD-10-CM | POA: Insufficient documentation

## 2017-02-17 DIAGNOSIS — Z87891 Personal history of nicotine dependence: Secondary | ICD-10-CM | POA: Diagnosis not present

## 2017-02-17 DIAGNOSIS — G5600 Carpal tunnel syndrome, unspecified upper limb: Secondary | ICD-10-CM | POA: Insufficient documentation

## 2017-02-17 DIAGNOSIS — D751 Secondary polycythemia: Secondary | ICD-10-CM | POA: Diagnosis not present

## 2017-02-17 DIAGNOSIS — Z79899 Other long term (current) drug therapy: Secondary | ICD-10-CM | POA: Insufficient documentation

## 2017-02-17 LAB — CBC
HEMATOCRIT: 41.2 % (ref 35.0–47.0)
HEMOGLOBIN: 14.5 g/dL (ref 12.0–16.0)
MCH: 31.8 pg (ref 26.0–34.0)
MCHC: 35.2 g/dL (ref 32.0–36.0)
MCV: 90.4 fL (ref 80.0–100.0)
Platelets: 371 10*3/uL (ref 150–440)
RBC: 4.56 MIL/uL (ref 3.80–5.20)
RDW: 12.1 % (ref 11.5–14.5)
WBC: 9.4 10*3/uL (ref 3.6–11.0)

## 2017-02-17 NOTE — Progress Notes (Signed)
Here for follow up. Overall " doing well " she stated

## 2017-02-17 NOTE — Progress Notes (Signed)
Hematology/Oncology Consult note [facility]  Telephone:([phone] [phone]  Patient Care Team: Coral Spikes, DO as PCP - General (Family Medicine)   Name of the patient: Leah [name]  [id number]  01-23-1966   Date of visit: 02/17/17  Diagnosis-secondary polycythemia resolved  Chief complaint/ Reason for visit- routine follow-up of polycythemia  Heme/Onc history: patient is a 51 year old female with no significant past medical history who was been referred to [place] for elevated hemoglobin. She smoked for about 20 years on and off and quit smoking about 6-7 years ago. She does not have any known chronic lung disease. She does report trouble with sleeping and wakes up a few times in the night. Recent hemoglobin from March and April 2018 was 15.8 and 15.6 respectively. Recent CBC from 07/05/2016 showed white count of 8.7, H&H of 15.6/44 and a platelet count of 344  Repeat cbc from 07/29/16 showed H/H of 15.9/44.9. Wbc and platelet count were normal. epo level was normal. JAK2 exon 10mutation testing negative. UA did not reveal any hematuria. cxr was normal   Interval history-she is doing well and denies any complaints today  ECOG PS- 0 Pain scale- 0  Review of systems- Review of Systems  Constitutional: Negative for chills, fever, malaise/fatigue and weight loss.  HENT: Negative for congestion, ear discharge and nosebleeds.   Eyes: Negative for blurred vision.  Respiratory: Negative for cough, hemoptysis, sputum production, shortness of breath and wheezing.   Cardiovascular: Negative for chest pain, palpitations, orthopnea and claudication.  Gastrointestinal: Negative for abdominal pain, blood in stool, constipation, diarrhea, heartburn, melena, nausea and vomiting.  Genitourinary: Negative for dysuria, flank pain, frequency, hematuria and urgency.  Musculoskeletal: Negative for back pain, joint pain and myalgias.  Skin: Negative for rash.    Neurological: Negative for dizziness, tingling, focal weakness, seizures, weakness and headaches.  Endo/Heme/Allergies: Does not bruise/bleed easily.  Psychiatric/Behavioral: Negative for depression and suicidal ideas. The patient does not have insomnia.      Allergies  Allergen Reactions  . Penicillins   . Sulfa Antibiotics      Past Medical History:  Diagnosis Date  . Asthma   . Carpal tunnel syndrome    R hand   . Herniated disc, cervical   . Low back pain   . Migraines      Past Surgical History:  Procedure Laterality Date  . CESAREAN SECTION    . ESOPHAGOGASTRODUODENOSCOPY    . THROAT SURGERY  2015  . UMBILICAL HERNIA REPAIR  2006    x 2   w mesh per pt    Social History   Socioeconomic History  . Marital status: Significant Other    Spouse name: Not on file  . Number of children: Not on file  . Years of education: Not on file  . Highest education level: Not on file  Social Needs  . Financial resource strain: Not on file  . Food insecurity - worry: Not on file  . Food insecurity - inability: Not on file  . Transportation needs - medical: Not on file  . Transportation needs - non-medical: Not on file  Occupational History  . Not on file  Tobacco Use  . Smoking status: Former Smoker    Packs/day: 1.00    Years: 20.00    Pack years: 20.00    Last attempt to quit: 06/02/2006    Years since quitting: 10.7  . Smokeless tobacco: Never Used  Substance and Sexual Activity  . Alcohol  use: Yes    Alcohol/week: 1.2 - 1.8 oz    Types: 2 - 3 Standard drinks or equivalent per week    Comment: socially  . Drug use: No  . Sexual activity: Yes    Partners: Male  Other Topics Concern  . Not on file  Social History Narrative  . Not on file    Family History  Problem Relation Age of Onset  . Breast cancer Mother   . Thyroid disease Mother   . Prostate cancer Father   . Cancer - Prostate Father   . Breast cancer Maternal Grandmother      Current  Outpatient Medications:  .  ibuprofen (ADVIL,MOTRIN) 800 MG tablet, Take 1 tablet (800 mg total) every 8 (eight) hours as needed by mouth., Disp: 270 tablet, Rfl: 0 .  methocarbamol (ROBAXIN) 750 MG tablet, TAKE 1 TABLET BY MOUTH EVERY 6 HOURS AS NEEDED FOR MUSCLE SPASMS, Disp: 120 tablet, Rfl: 0 .  ondansetron (ZOFRAN) 4 MG tablet, Take 1 tablet (4 mg total) by mouth every 8 (eight) hours as needed for nausea or vomiting. (Patient not taking: Reported on 02/17/2017), Disp: 20 tablet, Rfl: 0  Physical exam:  Vitals:   02/17/17 1001  BP: 122/79  Pulse: 75  Resp: 18  Temp: (!) 97.4 F (36.3 C)  TempSrc: Tympanic  Weight: 144 lb 9.6 oz (65.6 kg)   Physical Exam  Constitutional: She is oriented to person, place, and time and well-developed, well-nourished, and in no distress.  HENT:  Head: Normocephalic and atraumatic.  Eyes: EOM are normal. Pupils are equal, round, and reactive to light.  Neck: Normal range of motion.  Cardiovascular: Normal rate, regular rhythm and normal heart sounds.  Pulmonary/Chest: Effort normal and breath sounds normal.  Abdominal: Soft. Bowel sounds are normal.  Neurological: She is alert and oriented to person, place, and time.  Skin: Skin is warm and dry.     CMP Latest Ref Rng & Units 10/20/2016  Glucose 65 - 99 mg/dL 159(H)  BUN 6 - 20 mg/dL 14  Creatinine 0.44 - 1.00 mg/dL 0.77  Sodium 135 - 145 mmol/L 136  Potassium 3.5 - 5.1 mmol/L 3.1(L)  Chloride 101 - 111 mmol/L 103  CO2 22 - 32 mmol/L 22  Calcium 8.9 - 10.3 mg/dL 8.9  Total Protein 6.0 - 8.3 g/dL -  Total Bilirubin 0.2 - 1.2 mg/dL -  Alkaline Phos 39 - 117 U/L -  AST 0 - 37 U/L -  ALT 0 - 35 U/L -   CBC Latest Ref Rng & Units 02/17/2017  WBC 3.6 - 11.0 K/uL 9.4  Hemoglobin 12.0 - 16.0 g/dL 14.5  Hematocrit 35.0 - 47.0 % 41.2  Platelets 150 - 440 K/uL 371      Assessment and plan- Patient is a 51 y.o. female referred for polycythemia  Patient's polycythemia was transient and her  hemoglobin was Nevers more than 16.  Looking at her recent trend of hemoglobin from August 2018 until today her hemoglobin has been between 13-14 which is normal.  She therefore does not have polycythemia and does not need hematology follow-up at this time   Visit Diagnosis 1. Polycythemia, secondary      Dr. Randa Evens, MD, MPH [facility] at [facility] Pager- [phone] 02/17/2017 12:27 PM

## 2017-02-22 ENCOUNTER — Other Ambulatory Visit: Payer: Self-pay | Admitting: Internal Medicine

## 2017-02-22 DIAGNOSIS — M62838 Other muscle spasm: Secondary | ICD-10-CM

## 2017-03-08 ENCOUNTER — Encounter: Payer: Self-pay | Admitting: Internal Medicine

## 2017-03-08 ENCOUNTER — Other Ambulatory Visit: Payer: Self-pay | Admitting: Internal Medicine

## 2017-03-08 ENCOUNTER — Other Ambulatory Visit (HOSPITAL_COMMUNITY)
Admission: RE | Admit: 2017-03-08 | Discharge: 2017-03-08 | Disposition: A | Discharge status: Home / Self Care | Payer: BLUE CROSS/BLUE SHIELD | Source: Ambulatory Visit | Attending: Internal Medicine | Admitting: Internal Medicine

## 2017-03-08 ENCOUNTER — Ambulatory Visit (INDEPENDENT_AMBULATORY_CARE_PROVIDER_SITE_OTHER): Payer: BLUE CROSS/BLUE SHIELD

## 2017-03-08 ENCOUNTER — Ambulatory Visit: Payer: BLUE CROSS/BLUE SHIELD | Admitting: Internal Medicine

## 2017-03-08 VITALS — BP 122/70 | HR 55 | Temp 98.5°F | Ht 63.0 in | Wt 146.0 lb

## 2017-03-08 DIAGNOSIS — Z113 Encounter for screening for infections with a predominantly sexual mode of transmission: Secondary | ICD-10-CM | POA: Diagnosis not present

## 2017-03-08 DIAGNOSIS — Z0000 Encounter for general adult medical examination without abnormal findings: Secondary | ICD-10-CM | POA: Diagnosis not present

## 2017-03-08 DIAGNOSIS — Z1159 Encounter for screening for other viral diseases: Secondary | ICD-10-CM

## 2017-03-08 DIAGNOSIS — M25559 Pain in unspecified hip: Secondary | ICD-10-CM | POA: Insufficient documentation

## 2017-03-08 DIAGNOSIS — Z13220 Encounter for screening for lipoid disorders: Secondary | ICD-10-CM | POA: Diagnosis not present

## 2017-03-08 DIAGNOSIS — M25551 Pain in right hip: Secondary | ICD-10-CM | POA: Diagnosis not present

## 2017-03-08 DIAGNOSIS — G8929 Other chronic pain: Secondary | ICD-10-CM | POA: Diagnosis not present

## 2017-03-08 DIAGNOSIS — Z23 Encounter for immunization: Secondary | ICD-10-CM

## 2017-03-08 DIAGNOSIS — N898 Other specified noninflammatory disorders of vagina: Secondary | ICD-10-CM | POA: Diagnosis not present

## 2017-03-08 DIAGNOSIS — F329 Major depressive disorder, single episode, unspecified: Secondary | ICD-10-CM | POA: Diagnosis not present

## 2017-03-08 DIAGNOSIS — M5440 Lumbago with sciatica, unspecified side: Secondary | ICD-10-CM

## 2017-03-08 DIAGNOSIS — R1310 Dysphagia, unspecified: Secondary | ICD-10-CM | POA: Insufficient documentation

## 2017-03-08 DIAGNOSIS — K8689 Other specified diseases of pancreas: Secondary | ICD-10-CM

## 2017-03-08 DIAGNOSIS — F419 Anxiety disorder, unspecified: Secondary | ICD-10-CM

## 2017-03-08 DIAGNOSIS — M545 Low back pain, unspecified: Secondary | ICD-10-CM | POA: Insufficient documentation

## 2017-03-08 DIAGNOSIS — R109 Unspecified abdominal pain: Secondary | ICD-10-CM

## 2017-03-08 LAB — T4, FREE: Free T4: 0.84 ng/dL (ref 0.60–1.60)

## 2017-03-08 LAB — COMPREHENSIVE METABOLIC PANEL
ALT: 11 U/L (ref 0–35)
AST: 12 U/L (ref 0–37)
Albumin: 4.3 g/dL (ref 3.5–5.2)
Alkaline Phosphatase: 43 U/L (ref 39–117)
BUN: 18 mg/dL (ref 6–23)
CHLORIDE: 100 meq/L (ref 96–112)
CO2: 29 meq/L (ref 19–32)
CREATININE: 0.64 mg/dL (ref 0.40–1.20)
Calcium: 9.7 mg/dL (ref 8.4–10.5)
GFR: 104.79 mL/min (ref >60.00)
GLUCOSE: 96 mg/dL (ref 70–99)
Potassium: 3.7 mEq/L (ref 3.5–5.1)
SODIUM: 137 meq/L (ref 135–145)
Total Bilirubin: 0.8 mg/dL (ref 0.2–1.2)
Total Protein: 7.3 g/dL (ref 6.0–8.3)

## 2017-03-08 LAB — LIPID PANEL
Cholesterol: 202 mg/dL — ABNORMAL HIGH (ref 0–200)
HDL: 48.9 mg/dL (ref >39.00)
LDL Cholesterol: 119 mg/dL — ABNORMAL HIGH (ref 0–99)
NonHDL: 153.57
TRIGLYCERIDES: 171 mg/dL — AB (ref 0.0–149.0)
Total CHOL/HDL Ratio: 4
VLDL: 34.2 mg/dL (ref 0.0–40.0)

## 2017-03-08 LAB — URINALYSIS, ROUTINE W REFLEX MICROSCOPIC
BILIRUBIN URINE: NEGATIVE
KETONES UR: NEGATIVE
NITRITE: NEGATIVE
PH: 6 (ref 5.0–8.0)
SPECIFIC GRAVITY, URINE: 1.01 (ref 1.000–1.030)
TOTAL PROTEIN, URINE-UPE24: NEGATIVE
URINE GLUCOSE: NEGATIVE
UROBILINOGEN UA: 0.2 (ref 0.0–1.0)

## 2017-03-08 LAB — C-REACTIVE PROTEIN: CRP: 0.1 mg/dL — AB (ref 0.5–20.0)

## 2017-03-08 LAB — SEDIMENTATION RATE: Sed Rate: 32 mm/hr — ABNORMAL HIGH (ref 0–20)

## 2017-03-08 LAB — TSH: TSH: 1.92 u[IU]/mL (ref 0.35–4.50)

## 2017-03-08 NOTE — Progress Notes (Addendum)
Chief Complaint  Patient presents with  . Establish Care   Establish care  1. Anxiety/depression PHQ9 score 9 today she rather see therapy than meds for now will refer  2. C/o dysphagia for solids and liq and h/o stricture and dilation but occurring again  3. C/o right hip pain and sciatica low back pain with hip flexor issues and right hip swelling. Pain is throbbing/hurtnig x 2 months no injury pain at times has her crying. Low back pain can be 7/10 tries Ibuprofen 800 mg bid and Robaxin. She reports h/o herniated disc in neck and steroid shots and affects hands causes numbness 03/2016 last steroid shot to neck and had 1x per year. Need to get records from [place]. Right hip pain radiates to groin  4. C/o vagnial discharge on and off cloudy she is due for pap will reschedule      Review of Systems  Constitutional: Negative for fever.  HENT: Positive for tinnitus.        +dysphagia   Eyes:       No vision problems   Respiratory: Negative for shortness of breath.   Cardiovascular: Negative for chest pain.  Gastrointestinal: Negative for abdominal pain.  Genitourinary:       +vaginal discharge on and off cloudy   Musculoskeletal: Positive for back pain and joint pain.  Skin: Negative for rash.  Neurological: Positive for sensory change.       +numbness right arm and leg at times  CTS right wrist and joint pain left wrist   Psychiatric/Behavioral: Positive for depression. The patient has insomnia.    Past Medical History:  Diagnosis Date  . Asthma   . Carpal tunnel syndrome    R hand   . Herniated disc, cervical   . Low back pain   . Migraines    Past Surgical History:  Procedure Laterality Date  . CESAREAN SECTION    . ESOPHAGOGASTRODUODENOSCOPY    . THROAT SURGERY  2015  . UMBILICAL HERNIA REPAIR  2006    x 2   w mesh per pt   Family History  Problem Relation Age of Onset  . Breast cancer Mother   . Thyroid disease Mother   . Prostate cancer Father   . Cancer - Prostate  Father   . Breast cancer Maternal Grandmother    Social History   Socioeconomic History  . Marital status: Significant Other    Spouse name: Not on file  . Number of children: Not on file  . Years of education: Not on file  . Highest education level: Not on file  Social Needs  . Financial resource strain: Not on file  . Food insecurity - worry: Not on file  . Food insecurity - inability: Not on file  . Transportation needs - medical: Not on file  . Transportation needs - non-medical: Not on file  Occupational History  . Not on file  Tobacco Use  . Smoking status: Former Smoker    Packs/day: 1.00    Years: 20.00    Pack years: 20.00    Last attempt to quit: 06/02/2006    Years since quitting: 10.7  . Smokeless tobacco: Never Used  Substance and Sexual Activity  . Alcohol use: Yes    Alcohol/week: 1.2 - 1.8 oz    Types: 2 - 3 Standard drinks or equivalent per week    Comment: socially  . Drug use: No  . Sexual activity: Yes    Partners: Male  Other  Topics Concern  . Not on file  Social History Narrative  . Not on file   No outpatient medications have been marked as taking for the 03/08/17 encounter (Office Visit) with McLean-Scocuzza, Nino Glow, MD.   Allergies  Allergen Reactions  . Penicillins   . Sulfa Antibiotics    Recent Results (from the past 2160 hour(s))  CBC     Status: None   Collection Time: 02/17/17  9:40 AM  Result Value Ref Range   WBC 9.4 3.6 - 11.0 K/uL   RBC 4.56 3.80 - 5.20 MIL/uL   Hemoglobin 14.5 12.0 - 16.0 g/dL   HCT 41.2 35.0 - 47.0 %   MCV 90.4 80.0 - 100.0 fL   MCH 31.8 26.0 - 34.0 pg   MCHC 35.2 32.0 - 36.0 g/dL   RDW 12.1 11.5 - 14.5 %   Platelets 371 150 - 440 K/uL   Objective  Body mass index is 25.86 kg/m. Wt Readings from Last 3 Encounters:  03/08/17 146 lb (66.2 kg)  02/17/17 144 lb 9.6 oz (65.6 kg)  10/20/16 167 lb (75.8 kg)   Temp Readings from Last 3 Encounters:  03/08/17 98.5 F (36.9 C) (Oral)  02/17/17 (!) 97.4  F (36.3 C) (Tympanic)  10/20/16 99.3 F (37.4 C) (Oral)   BP Readings from Last 3 Encounters:  03/08/17 122/70  02/17/17 122/79  10/20/16 102/88   Pulse Readings from Last 3 Encounters:  03/08/17 (!) 55  02/17/17 75  10/20/16 89   O2 sat room air 98%   Physical Exam  Constitutional: She is oriented to person, place, and time and well-developed, well-nourished, and in no distress.  HENT:  Head: Normocephalic and atraumatic.  Mouth/Throat: Oropharynx is clear and moist and mucous membranes are normal.  Eyes: Conjunctivae are normal. Pupils are equal, round, and reactive to light.  Cardiovascular: Normal rate, regular rhythm and normal heart sounds.  Neg leg edema b/l   Pulmonary/Chest: Effort normal and breath sounds normal. Right breast exhibits no inverted nipple, no mass, no nipple discharge, no skin change and no tenderness. Left breast exhibits no inverted nipple, no mass, no nipple discharge, no skin change and no tenderness. Breasts are asymmetrical.  Left breast larger than right not changing  Abdominal: Soft. Bowel sounds are normal.  Musculoskeletal: She exhibits tenderness.       Right hip: She exhibits tenderness.       Lumbar back: She exhibits no tenderness.  Neg straight leg test b/l  B/l crepitus knees   Neurological: She is alert and oriented to person, place, and time. Gait normal. Gait normal.  Skin: Skin is warm, dry and intact.  Lentigos, angiomas   Psychiatric: Mood, memory, affect and judgment normal.  Nursing note and vitals reviewed.   Assessment   1. Anxiety/depression  2. Dysphagia solids and liq  3. Low back pain  4. Right hip pain  5. Vaginal discharge sexually active with men STD check today  6. HM  Plan  1. phq 9 score 9 wants to try therapy disc med apps  2. Refer GI Dr. Allen Norris  3. And 4. Xray low back and right hip rec tylenol prn for pain taking ibuprofen 800 mg bid as well  5. Check urine labs today and STD check  Pap at f/u   6.  Given flu shot today  rec Tdap reviewed old records had 08/17/15 Boostrix   Pap at f/u in 2 weeks  Labs today RA, CMET, lipid, TSH, T4, UA,  STD check   mammo due dx b/l and L breast US 10/07/17  Had 10/05/15 outer post left breast with assymetry further eval rec with spot compression and [place] +dense breasts.   04/05/16 mammo stable benign and prob benign findings left breat 6 month s/u mammogram and [place] rec.   Colonoscopy due age 68 y.o had FIT 04/03/13 negative  Pharmacy [facility] [place]  Get records old PCP in [place] obtained (also noted rhomboid myofascial syndrome, h/o CTS declined surgery, chronic h/a), ortho in [place], MRI and Xrays neck in [place].   Obtained old records labs 08/17/15 TC 268, TGs 301, HDL 49, LDL 159  EGD 03/29/41 +eosinophilic infiltrate 28 eos per high powered field +eosinophillic esophagitis. H/o schatizkis ring s/p dilatation  Provider: Dr. Olivia Mackie McLean-Scocuzza-Internal Medicine

## 2017-03-08 NOTE — Patient Instructions (Signed)
F/u in 2 weeks for pap  Labs and Xrays today  rec Tylenol for pain as well max 500 mg up to 6 pills in 1 day  Rec. Get Tdap vaccine.    Hip Pain The hip is the joint between the upper legs and the lower pelvis. The bones, cartilage, tendons, and muscles of your hip joint support your body and allow you to move around. Hip pain can range from a minor ache to severe pain in one or both of your hips. The pain may be felt on the inside of the hip joint near the groin, or the outside near the buttocks and upper thigh. You may also have swelling or stiffness. Follow these instructions at home: Managing pain, stiffness, and swelling  If directed, apply ice to the injured area. ? Put ice in a plastic bag. ? Place a towel between your skin and the bag. ? Leave the ice on for 20 minutes, 2-3 times a day  Sleep with a pillow between your legs on your most comfortable side.  Avoid any activities that cause pain. General instructions  Take over-the-counter and prescription medicines only as told by your health care provider.  Do any exercises as told by your health care provider.  Record the following: ? How often you have hip pain. ? The location of your pain. ? What the pain feels like. ? What makes the pain worse.  Keep all follow-up visits as told by your health care provider. This is important. Contact a health care provider if:  You cannot put weight on your leg.  Your pain or swelling continues or gets worse after one week.  It gets harder to walk.  You have a fever. Get help right away if:  You fall.  You have a sudden increase in pain and swelling in your hip.  Your hip is red or swollen or very tender to touch. Summary  Hip pain can range from a minor ache to severe pain in one or both of your hips.  The pain may be felt on the inside of the hip joint near the groin, or the outside near the buttocks and upper thigh.  Avoid any activities that cause pain.  Record  how often you have hip pain, the location of the pain, what makes it worse and what it feels like. This information is not intended to replace advice given to you by your health care provider. Make sure you discuss any questions you have with your health care provider. Document Released: 08/18/2009 Document Revised: 02/01/2016 Document Reviewed: 02/01/2016 Elsevier Interactive Patient Education  2018 [place].  Generalized Anxiety Disorder, Adult Generalized anxiety disorder (GAD) is a mental health disorder. People with this condition constantly worry about everyday events. Unlike normal anxiety, worry related to GAD is not triggered by a specific event. These worries also do not fade or get better with time. GAD interferes with life functions, including relationships, work, and school. GAD can vary from mild to severe. People with severe GAD can have intense waves of anxiety with physical symptoms (panic attacks). What are the causes? The exact cause of GAD is not known. What increases the risk? This condition is more likely to develop in:  Women.  People who have a family history of anxiety disorders.  People who are very shy.  People who experience very stressful life events, such as the death of a loved one.  People who have a very stressful family environment.  What are the  signs or symptoms? People with GAD often worry excessively about many things in their lives, such as their health and family. They may also be overly concerned about:  Doing well at work.  Being on time.  Natural disasters.  Friendships.  Physical symptoms of GAD include:  Fatigue.  Muscle tension or having muscle twitches.  Trembling or feeling shaky.  Being easily startled.  Feeling like your heart is pounding or racing.  Feeling out of breath or like you cannot take a deep breath.  Having trouble falling asleep or staying asleep.  Sweating.  Nausea, diarrhea, or irritable bowel  syndrome (IBS).  Headaches.  Trouble concentrating or remembering facts.  Restlessness.  Irritability.  How is this diagnosed? Your health care provider can diagnose GAD based on your symptoms and medical history. You will also have a physical exam. The health care provider will ask specific questions about your symptoms, including how severe they are, when they started, and if they come and go. Your health care provider may ask you about your use of alcohol or drugs, including prescription medicines. Your health care provider may refer you to a mental health specialist for further evaluation. Your health care provider will do a thorough examination and may perform additional tests to rule out other possible causes of your symptoms. To be diagnosed with GAD, a person must have anxiety that:  Is out of his or her control.  Affects several different aspects of his or her life, such as work and relationships.  Causes distress that makes him or her unable to take part in normal activities.  Includes at least three physical symptoms of GAD, such as restlessness, fatigue, trouble concentrating, irritability, muscle tension, or sleep problems.  Before your health care provider can confirm a diagnosis of GAD, these symptoms must be present more days than they are not, and they must last for six months or longer. How is this treated? The following therapies are usually used to treat GAD:  Medicine. Antidepressant medicine is usually prescribed for long-term daily control. Antianxiety medicines may be added in severe cases, especially when panic attacks occur.  Talk therapy (psychotherapy). Certain types of talk therapy can be helpful in treating GAD by providing support, education, and guidance. Options include: ? Cognitive behavioral therapy (CBT). People learn coping skills and techniques to ease their anxiety. They learn to identify unrealistic or negative thoughts and behaviors and to  replace them with positive ones. ? Acceptance and commitment therapy (ACT). This treatment teaches people how to be mindful as a way to cope with unwanted thoughts and feelings. ? Biofeedback. This process trains you to manage your body's response (physiological response) through breathing techniques and relaxation methods. You will work with a therapist while machines are used to monitor your physical symptoms.  Stress management techniques. These include yoga, meditation, and exercise.  A mental health specialist can help determine which treatment is best for you. Some people see improvement with one type of therapy. However, other people require a combination of therapies. Follow these instructions at home:  Take over-the-counter and prescription medicines only as told by your health care provider.  Try to maintain a normal routine.  Try to anticipate stressful situations and allow extra time to manage them.  Practice any stress management or self-calming techniques as taught by your health care provider.  Do not punish yourself for setbacks or for not making progress.  Try to recognize your accomplishments, even if they are small.  Keep all follow-up  visits as told by your health care provider. This is important. Contact a health care provider if:  Your symptoms do not get better.  Your symptoms get worse.  You have signs of depression, such as: ? A persistently sad, cranky, or irritable mood. ? Loss of enjoyment in activities that used to bring you joy. ? Change in weight or eating. ? Changes in sleeping habits. ? Avoiding friends or family members. ? Loss of energy for normal tasks. ? Feelings of guilt or worthlessness. Get help right away if:  You have serious thoughts about hurting yourself or others. If you ever feel like you may hurt yourself or others, or have thoughts about taking your own life, get help right away. You can go to your nearest emergency department or  call:  Your local emergency services (911 in the [place]  A suicide crisis helpline, such as the [facility] at 864-779-7858. This is open 24 hours a day.  Summary  Generalized anxiety disorder (GAD) is a mental health disorder that involves worry that is not triggered by a specific event.  People with GAD often worry excessively about many things in their lives, such as their health and family.  GAD may cause physical symptoms such as restlessness, trouble concentrating, sleep problems, frequent sweating, nausea, diarrhea, headaches, and trembling or muscle twitching.  A mental health specialist can help determine which treatment is best for you. Some people see improvement with one type of therapy. However, other people require a combination of therapies. This information is not intended to replace advice given to you by your health care provider. Make sure you discuss any questions you have with your health care provider. Document Released: 06/25/2012 Document Revised: 01/19/2016 Document Reviewed: 01/19/2016 Elsevier Interactive Patient Education  [employer].

## 2017-03-09 ENCOUNTER — Encounter: Payer: Self-pay | Admitting: Gastroenterology

## 2017-03-09 ENCOUNTER — Ambulatory Visit (INDEPENDENT_AMBULATORY_CARE_PROVIDER_SITE_OTHER): Payer: BLUE CROSS/BLUE SHIELD | Admitting: Gastroenterology

## 2017-03-09 ENCOUNTER — Other Ambulatory Visit: Payer: Self-pay

## 2017-03-09 VITALS — BP 108/66 | HR 76 | Temp 98.4°F | Ht 63.0 in | Wt 144.0 lb

## 2017-03-09 DIAGNOSIS — R1310 Dysphagia, unspecified: Secondary | ICD-10-CM

## 2017-03-09 DIAGNOSIS — R634 Abnormal weight loss: Secondary | ICD-10-CM

## 2017-03-09 DIAGNOSIS — R1319 Other dysphagia: Secondary | ICD-10-CM

## 2017-03-09 LAB — RHEUMATOID FACTOR

## 2017-03-09 LAB — HEPATITIS B SURFACE ANTIBODY, QUANTITATIVE: Hepatitis B-Post: <5 m[IU]/mL — ABNORMAL LOW (ref >=10)

## 2017-03-09 LAB — CYCLIC CITRUL PEPTIDE ANTIBODY, IGG

## 2017-03-09 LAB — HIV ANTIBODY (ROUTINE TESTING W REFLEX): HIV 1&2 Ab, 4th Generation: NONREACTIVE

## 2017-03-09 LAB — HEPATITIS C ANTIBODY
HEP C AB: NONREACTIVE
SIGNAL TO CUT-OFF: 0.01 (ref <1.00)

## 2017-03-09 LAB — ANA: ANA: NEGATIVE

## 2017-03-09 LAB — URINE CYTOLOGY ANCILLARY ONLY
CHLAMYDIA, DNA PROBE: NEGATIVE
Neisseria Gonorrhea: NEGATIVE
Trichomonas: NEGATIVE

## 2017-03-09 LAB — RPR: RPR: NONREACTIVE

## 2017-03-09 LAB — HEPATITIS B SURFACE ANTIGEN: Hepatitis B Surface Ag: NONREACTIVE

## 2017-03-09 LAB — HEPATITIS B CORE ANTIBODY, TOTAL: Hep B Core Total Ab: NONREACTIVE

## 2017-03-09 LAB — HSV 2 ANTIBODY, IGG

## 2017-03-09 LAB — HSV 1 ANTIBODY, IGG: HSV 1 GLYCOPROTEIN G AB, IGG: 3.48 {index} — AB

## 2017-03-09 MED ORDER — OMEPRAZOLE 40 MG PO CPDR: 40.0000 mg | DELAYED_RELEASE_CAPSULE | Freq: Every day | ORAL | 3 refills | Status: DC

## 2017-03-09 NOTE — Progress Notes (Signed)
Leah Bellows MD, MRCP(U.K) [address]  [place]  [place], [place] [address]  Main: [phone]  Fax: [phone]   Gastroenterology Consultation  Referring Provider:     [place]-Scocuzza, Olivia Mackie * Primary Care Physician:  [place]-Scocuzza, Nino Glow, MD Primary Gastroenterologist:  Dr. Jonathon [name]  Reason for Consultation:     Dysphagia         HPI:   Leah [name] is a 51 y.o. y/o female referred for consultation & management  by Dr. Terese Door, Nino Glow, MD.     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  Lost 20 lbs over 4 months, Ex smoker  Prior EGD: yes  PPI/H2 blocker use : no   Never had a colonoscopy . No family  History of colon cancer.   She is scheduled for a CT scan of her abdomen.  Sometimes food goes "wrong way " and starts coughing .    BP 108/66   Pulse 76   Temp 98.4 F (36.9 C) (Oral)   Ht 5\' 3"  (1.6 m)   Wt 144 lb (65.3 kg)   BMI 25.51 kg/m    Past Medical History:  Diagnosis Date  . Anxiety   . Asthma   . Carpal tunnel syndrome    R hand   . Depression   . Esophageal stricture    s/p dilatation   . Herniated disc, cervical    s/p epidural injection  . Low back pain   . Migraines     Past Surgical History:  Procedure Laterality Date  . CESAREAN SECTION    . ESOPHAGOGASTRODUODENOSCOPY    . THROAT SURGERY  2015  . UMBILICAL HERNIA REPAIR  2006    x 2   w mesh per pt    Prior to Admission medications   Medication Sig Start Date End Date Taking? Authorizing  Provider  ibuprofen (ADVIL,MOTRIN) 800 MG tablet Take 1 tablet (800 mg total) every 8 (eight) hours as needed by mouth. 01/18/17 04/18/17  Leone Haven, MD  methocarbamol (ROBAXIN) 750 MG tablet TAKE 1 TABLET BY MOUTH EVERY 6 HOURS AS NEEDED FOR MUSCLE SPASMS 02/24/17   Crecencio Mc, MD    Family History  Problem Relation Age of Onset  . Breast cancer Mother   . Thyroid disease Mother   . Prostate cancer Father   . Cancer - Prostate Father   . Breast cancer Maternal Grandmother      Social History   Tobacco Use  . Smoking status: Former Smoker    Packs/day: 1.00    Years: 20.00    Pack years: 20.00    Last attempt to quit: 06/02/2006    Years since quitting: 10.7  . Smokeless tobacco: Never Used  Substance Use Topics  . Alcohol use: Yes    Alcohol/week: 1.2 - 1.8 oz    Types: 2 - 3 Standard drinks or equivalent per week    Comment: socially  . Drug use: No    Allergies as  of 03/09/2017 - Review Complete 03/09/2017  Allergen Reaction Noted  . Penicillins  06/01/2016  . Sulfa antibiotics  06/01/2016    Review of Systems:    All systems reviewed and negative except where noted in HPI.   Physical Exam:  BP 108/66   Pulse 76   Temp 98.4 F (36.9 C) (Oral)   Ht 5\' 3"  (1.6 m)   Wt 144 lb (65.3 kg)   BMI 25.51 kg/m  No LMP recorded. Patient is postmenopausal. Psych:  Alert and cooperative. Normal mood and affect. General:   Alert,  Well-developed, well-nourished, pleasant and cooperative in NAD Head:  Normocephalic and atraumatic. Eyes:  Sclera clear, no icterus.   Conjunctiva pink. Ears:  Normal auditory acuity. Nose:  No deformity, discharge, or lesions. Mouth:  No deformity or lesions,oropharynx pink & moist. Neck:  Supple; no masses or thyromegaly. Lungs:  Respirations even and unlabored.  Clear throughout to auscultation.   No wheezes, crackles, or rhonchi. No acute distress. Heart:  Regular rate and rhythm; no murmurs, clicks, rubs, or gallops. Abdomen:   Normal bowel sounds.  No bruits.  Soft, non-tender and non-distended without masses, hepatosplenomegaly or hernias noted.  No guarding or rebound tenderness.    Msk:  Symmetrical without gross deformities. Good, equal movement & strength bilaterally. Pulses:  Normal pulses noted. Extremities:  No clubbing or edema.  No cyanosis. Neurologic:  Alert and oriented x3;  grossly normal neurologically. Skin:  Intact without significant lesions or rashes. No jaundice. Lymph Nodes:  No significant cervical adenopathy. Psych:  Alert and cooperative. Normal mood and affect.  Imaging Studies: Dg Lumbar Spine Complete  Result Date: 03/08/2017 CLINICAL DATA:  Right hip and low back pain for several years, worsened recently. No recent injury. EXAM: LUMBAR SPINE - COMPLETE 4+ VIEW COMPARISON:  None. FINDINGS: There are 5 non rib-bearing lumbar type vertebral bodies. There is a very mild scoliotic curvature of the thoracolumbar spine with dominant caudal component convex to the left. Grade 1 anterolisthesis of L4 upon L5 measuring approximately 4 mm. No associated pars defects. Lumbar vertebral body heights appear preserved. Mild to moderate multilevel lumbar spine DDD, worse at L2-L3 and L3-L4 with disc space height loss, endplate irregularity and sclerosis. Limited visualization of the bilateral SI joints and hips is normal. Post right-sided Essure placement. Indeterminate linear clustered calcifications overlying the left mid hemiabdomen. IMPRESSION: 1. Mild to moderate multilevel lumbar spine DDD. 2. Linear clustered calcifications overlie the left mid hemiabdomen - while the structures may represent ingested radiopaque debris versus calcifications within the tail of the pancreas, the structures do not appear to represent renal stones. While of uncertain etiology and clinical significance, however further evaluation with abdominal CT could performed as indicated. Electronically Signed   By: Sandi Mariscal M.D.   On:  03/08/2017 09:24   Dg Hip Unilat With Pelvis 2-3 Views Right  Result Date: 03/08/2017 CLINICAL DATA:  Chronic right-sided hip pain, worsened recently. EXAM: DG HIP (WITH OR WITHOUT PELVIS) 2-3V RIGHT COMPARISON:  None. FINDINGS: No fracture or dislocation. Very mild degenerative change of the right hip with joint space loss and osteophytosis. No evidence of avascular necrosis. Very limited visualization of the pelvis and contralateral left hip is normal An Essure device overlies the right hemipelvis. Regional soft tissues appear otherwise normal. IMPRESSION: Very mild degenerative change of the right hip. Electronically Signed   By: Sandi Mariscal M.D.   On: 03/08/2017 09:25    Assessment and Plan:   Paulena Servais is  a 51 y.o. y/o female has been referred for dysphagia. Her history is suggestive of possible esophageal stricture , h/o asthma. She has also lost a lot of weight recently which could be due to stress or other reasons. Will watch closely .   Plan  1. EGD+/- dilation along with colonoscopy to evaluate abnormal weight loss 2. IF continues to lose weight at next visit and her CT abdomen is inconclusive would suggest CT scan of her chest due to history of smoking.  3. I will rule out eosinoiphilic esophagitis due to history of asthma and allergies as cause of recurrent stricture 4. Commence on Prilosec 40 mg once a day.  5. Modified barium swallow to rule out oropharyngeal dysphagia.    I have discussed alternative options, risks & benefits,  which include, but are not limited to, bleeding, infection, perforation,respiratory complication & drug reaction.  The patient agrees with this plan & written consent will be obtained.     Follow up in 8 weeks   Dr Leah Bellows MD,MRCP(U.K)

## 2017-03-09 NOTE — Addendum Note (Signed)
Addended by: Vanetta Mulders on: 03/09/2017 02:34 PM   Modules accepted: Orders, SmartSet

## 2017-03-09 NOTE — Patient Instructions (Addendum)
During your visit today you have been schedule for an Upper Endoscopy and a Colonoscopy on January 14th, 2019.  Office staff will contact you to schedule a Modified Barium Swallow.  Follow up with our office in 8 weeks.  A prescription has been sent to your [facility].  Please call us with any questions or concerns.

## 2017-03-15 ENCOUNTER — Other Ambulatory Visit: Payer: Self-pay | Admitting: Internal Medicine

## 2017-03-15 DIAGNOSIS — B9689 Other specified bacterial agents as the cause of diseases classified elsewhere: Secondary | ICD-10-CM

## 2017-03-15 DIAGNOSIS — N760 Acute vaginitis: Principal | ICD-10-CM

## 2017-03-15 LAB — URINE CYTOLOGY ANCILLARY ONLY
Bacterial vaginitis: POSITIVE — AB
CANDIDA VAGINITIS: NEGATIVE

## 2017-03-15 MED ORDER — METRONIDAZOLE 500 MG PO TABS: 500.0000 mg | ORAL_TABLET | Freq: Two times a day (BID) | ORAL | 0 refills | Status: DC

## 2017-03-21 ENCOUNTER — Telehealth: Payer: Self-pay

## 2017-03-21 NOTE — Telephone Encounter (Signed)
Copied from [place] [phone]. Topic: General - Other >> Mar 21, 2017 11:18 AM Vernona Rieger wrote: Wyatt Portela from [facility] would like Micheline Maze to give her a call back [phone], selection option 2 then 3 Reference number [id number]

## 2017-03-21 NOTE — Telephone Encounter (Signed)
Called [facility] back at the number provided and gave the reference number. No information was in pt's file as to why Leah [name] called. Message closed

## 2017-03-23 ENCOUNTER — Ambulatory Visit (INDEPENDENT_AMBULATORY_CARE_PROVIDER_SITE_OTHER): Payer: BLUE CROSS/BLUE SHIELD | Admitting: Psychology

## 2017-03-23 DIAGNOSIS — F419 Anxiety disorder, unspecified: Secondary | ICD-10-CM | POA: Diagnosis not present

## 2017-03-23 DIAGNOSIS — F329 Major depressive disorder, single episode, unspecified: Secondary | ICD-10-CM | POA: Diagnosis not present

## 2017-03-24 ENCOUNTER — Other Ambulatory Visit: Payer: Self-pay

## 2017-03-24 DIAGNOSIS — R634 Abnormal weight loss: Secondary | ICD-10-CM

## 2017-03-24 MED ORDER — PEG 3350-KCL-NA BICARB-NACL 420 G PO SOLR: 4000.0000 mL | Freq: Once | ORAL | 0 refills | Status: AC

## 2017-03-27 ENCOUNTER — Encounter: Payer: Self-pay | Admitting: *Deleted

## 2017-03-27 ENCOUNTER — Ambulatory Visit: Payer: BLUE CROSS/BLUE SHIELD | Admitting: Certified Registered Nurse Anesthetist

## 2017-03-27 ENCOUNTER — Ambulatory Visit
Admission: RE | Admit: 2017-03-27 | Discharge: 2017-03-27 | Disposition: A | Discharge status: Home / Self Care | Payer: BLUE CROSS/BLUE SHIELD | Source: Ambulatory Visit | Attending: Gastroenterology | Admitting: Gastroenterology

## 2017-03-27 ENCOUNTER — Encounter
Admission: RE | Disposition: A | Discharge status: Home / Self Care | Payer: Self-pay | Source: Ambulatory Visit | Attending: Gastroenterology

## 2017-03-27 DIAGNOSIS — Z87891 Personal history of nicotine dependence: Secondary | ICD-10-CM | POA: Diagnosis not present

## 2017-03-27 DIAGNOSIS — G5601 Carpal tunnel syndrome, right upper limb: Secondary | ICD-10-CM | POA: Insufficient documentation

## 2017-03-27 DIAGNOSIS — Z803 Family history of malignant neoplasm of breast: Secondary | ICD-10-CM | POA: Diagnosis not present

## 2017-03-27 DIAGNOSIS — Z8709 Personal history of other diseases of the respiratory system: Secondary | ICD-10-CM | POA: Diagnosis not present

## 2017-03-27 DIAGNOSIS — R1319 Other dysphagia: Secondary | ICD-10-CM

## 2017-03-27 DIAGNOSIS — K5730 Diverticulosis of large intestine without perforation or abscess without bleeding: Secondary | ICD-10-CM | POA: Insufficient documentation

## 2017-03-27 DIAGNOSIS — Z8349 Family history of other endocrine, nutritional and metabolic diseases: Secondary | ICD-10-CM | POA: Insufficient documentation

## 2017-03-27 DIAGNOSIS — K228 Other specified diseases of esophagus: Secondary | ICD-10-CM | POA: Diagnosis not present

## 2017-03-27 DIAGNOSIS — Z880 Allergy status to penicillin: Secondary | ICD-10-CM | POA: Diagnosis not present

## 2017-03-27 DIAGNOSIS — Z8042 Family history of malignant neoplasm of prostate: Secondary | ICD-10-CM | POA: Diagnosis not present

## 2017-03-27 DIAGNOSIS — D123 Benign neoplasm of transverse colon: Secondary | ICD-10-CM | POA: Insufficient documentation

## 2017-03-27 DIAGNOSIS — K529 Noninfective gastroenteritis and colitis, unspecified: Secondary | ICD-10-CM | POA: Diagnosis not present

## 2017-03-27 DIAGNOSIS — Z6824 Body mass index (BMI) 24.0-24.9, adult: Secondary | ICD-10-CM | POA: Insufficient documentation

## 2017-03-27 DIAGNOSIS — K640 First degree hemorrhoids: Secondary | ICD-10-CM | POA: Insufficient documentation

## 2017-03-27 DIAGNOSIS — Z882 Allergy status to sulfonamides status: Secondary | ICD-10-CM | POA: Insufficient documentation

## 2017-03-27 DIAGNOSIS — R634 Abnormal weight loss: Secondary | ICD-10-CM | POA: Insufficient documentation

## 2017-03-27 DIAGNOSIS — R1310 Dysphagia, unspecified: Secondary | ICD-10-CM | POA: Diagnosis not present

## 2017-03-27 DIAGNOSIS — Z79899 Other long term (current) drug therapy: Secondary | ICD-10-CM | POA: Insufficient documentation

## 2017-03-27 DIAGNOSIS — Z9889 Other specified postprocedural states: Secondary | ICD-10-CM | POA: Insufficient documentation

## 2017-03-27 HISTORY — PX: ESOPHAGOGASTRODUODENOSCOPY (EGD) WITH PROPOFOL: SHX5813

## 2017-03-27 HISTORY — PX: COLONOSCOPY WITH PROPOFOL: SHX5780

## 2017-03-27 SURGERY — ESOPHAGOGASTRODUODENOSCOPY (EGD) WITH PROPOFOL
Anesthesia: General

## 2017-03-27 MED ORDER — LIDOCAINE HCL (CARDIAC) 20 MG/ML IV SOLN
INTRAVENOUS | Status: DC | PRN
  Administered 2017-03-27: 50 mg via INTRAVENOUS

## 2017-03-27 MED ORDER — PROPOFOL 10 MG/ML IV BOLUS
INTRAVENOUS | Status: DC | PRN
  Administered 2017-03-27 (×2): 20 mg via INTRAVENOUS
  Administered 2017-03-27: 40 mg via INTRAVENOUS

## 2017-03-27 MED ORDER — PROPOFOL 500 MG/50ML IV EMUL
INTRAVENOUS | Status: DC | PRN
  Administered 2017-03-27: 150 ug/kg/min via INTRAVENOUS

## 2017-03-27 MED ORDER — LIDOCAINE HCL (PF) 2 % IJ SOLN
INTRAMUSCULAR | Status: AC
  Filled 2017-03-27: qty 10

## 2017-03-27 MED ORDER — PROPOFOL 500 MG/50ML IV EMUL
INTRAVENOUS | Status: AC
  Filled 2017-03-27: qty 50

## 2017-03-27 MED ORDER — SODIUM CHLORIDE 0.9 % IV SOLN
INTRAVENOUS | Status: DC
  Administered 2017-03-27: 10:00:00 via INTRAVENOUS

## 2017-03-27 NOTE — H&P (Addendum)
Leah Bellows, Leah [name] [address] [place], [place], [place], [address] [address], [place], [place], [place], [address] Phone: [phone]  Fax: [phone]  Primary Care Physician:  Leah [name], Leah Glow, Leah [name]   Pre-Procedure History & Physical: HPI:  Leah [name] is a 52 y.o. female is here for an endoscopy and colonoscopy    Past Medical History:  Diagnosis Date  . Anxiety   . Asthma   . Carpal tunnel syndrome    R hand   . Depression   . Esophageal stricture    s/p dilatation   . Herniated disc, cervical    s/p epidural injection  . Low back pain   . Migraines     Past Surgical History:  Procedure Laterality Date  . CESAREAN SECTION    . ESOPHAGOGASTRODUODENOSCOPY    . THROAT SURGERY  2015  . UMBILICAL HERNIA REPAIR  2006    x 2   w mesh per pt    Prior to Admission medications   Medication Sig Start Date End Date Taking? Authorizing Provider  methocarbamol (ROBAXIN) 750 MG tablet TAKE 1 TABLET BY MOUTH EVERY 6 HOURS AS NEEDED FOR MUSCLE SPASMS 02/24/17  Yes Leah [name], Leah [name]  omeprazole (PRILOSEC) 40 MG capsule Take 1 capsule (40 mg total) by mouth daily. 03/09/17  Yes Leah Bellows, Leah [name]  ibuprofen (ADVIL,MOTRIN) 800 MG tablet Take 1 tablet (800 mg total) every 8 (eight) hours as needed by mouth. 01/18/17 04/18/17  Leah Haven, Leah [name]  metroNIDAZOLE (FLAGYL) 500 MG tablet Take 1 tablet (500 mg total) by mouth 2 (two) times daily. With food Patient not taking: Reported on 03/27/2017 03/15/17   Leah [name], Leah Glow, Leah [name]    Allergies as of 03/09/2017 - Review Complete 03/09/2017  Allergen Reaction Noted  . Penicillins  06/01/2016  . Sulfa antibiotics  06/01/2016    Family History  Problem Relation Age of Onset  . Breast cancer Mother   . Thyroid disease Mother   . Prostate cancer Father   . Cancer - Prostate Father   . Breast cancer Maternal Grandmother     Social History   Socioeconomic History  . Marital status: Significant Other    Spouse  name: Not on file  . Number of children: Not on file  . Years of education: Not on file  . Highest education level: Not on file  Social Needs  . Financial resource strain: Not on file  . Food insecurity - worry: Not on file  . Food insecurity - inability: Not on file  . Transportation needs - medical: Not on file  . Transportation needs - non-medical: Not on file  Occupational History  . Not on file  Tobacco Use  . Smoking status: Former Smoker    Packs/day: 1.00    Years: 20.00    Pack years: 20.00    Last attempt to quit: 06/02/2006    Years since quitting: 10.8  . Smokeless tobacco: Never Used  Substance and Sexual Activity  . Alcohol use: Yes    Alcohol/week: 1.2 - 1.8 oz    Types: 2 - 3 Standard drinks or equivalent per week    Comment: socially  . Drug use: No  . Sexual activity: Yes    Partners: Male  Other Topics Concern  . Not on file  Social History Narrative   Works in retail    2 kids 78 and 22 son and daughter live in [place]. As of 02/26/17    Divorced.  Former smoker 20+ years quit in 1997 then started again for 5 years and quit 10-12 years ago as of 03/08/17     Review of Systems: See HPI, otherwise negative ROS  Physical Exam: BP 108/68   Pulse (!) 122   Temp (!) 96.8 F (36 C) (Tympanic)   Resp 18   Ht 5\' 3"  (1.6 m)   Wt 139 lb (63 kg)   SpO2 97%   BMI 24.62 kg/m  General:   Alert,  pleasant and cooperative in NAD Head:  Normocephalic and atraumatic. Neck:  Supple; no masses or thyromegaly. Lungs:  Clear throughout to auscultation, normal respiratory effort.    Heart:  +S1, +S2, Regular rate and rhythm, No edema. Abdomen:  Soft, nontender and nondistended. Normal bowel sounds, without guarding, and without rebound.   Neurologic:  Alert and  oriented x4;  grossly normal neurologically.  Impression/Plan: Kevona Lupinacci is here for an endoscopy and colonoscopy  to be performed for  evaluation of unintentional weight loss and dysphagia     Risks,  benefits, limitations, and alternatives regarding endoscopy and dilation have been reviewed with the patient.  Questions have been answered.  All parties agreeable.   Leah Bellows, Leah [name]  03/27/2017, 10:00 AM

## 2017-03-27 NOTE — Transfer of Care (Signed)
Immediate Anesthesia Transfer of Care Note  Patient: Leah [name]  Procedure(s) Performed: ESOPHAGOGASTRODUODENOSCOPY (EGD) WITH PROPOFOL WITH DILATION (N/A ) COLONOSCOPY WITH PROPOFOL (N/A )  Patient Location: PACU  Anesthesia Type:General  Level of Consciousness: sedated  Airway & Oxygen Therapy: Patient Spontanous Breathing and Patient connected to nasal cannula oxygen  Post-op Assessment: Report given to RN and Post -op Vital signs reviewed and stable  Post vital signs: Reviewed and stable  Last Vitals:  Vitals:   03/27/17 0929 03/27/17 1044  BP: 108/68 99/68  Pulse: (!) 122 75  Resp: 18 18  Temp: (!) 36 C (!) 35.9 C  SpO2: 97% 100%    Last Pain:  Vitals:   03/27/17 1044  TempSrc: Tympanic         Complications: No apparent anesthesia complications

## 2017-03-27 NOTE — Anesthesia Post-op Follow-up Note (Signed)
Anesthesia QCDR form completed.        

## 2017-03-27 NOTE — Anesthesia Procedure Notes (Signed)
Date/Time: 03/27/2017 10:11 AM Performed by: Johnna Acosta, CRNA Pre-anesthesia Checklist: Patient identified, Emergency Drugs available, Suction available, Patient being monitored and Timeout performed Patient Re-evaluated:Patient Re-evaluated prior to induction Oxygen Delivery Method: Nasal cannula

## 2017-03-27 NOTE — Anesthesia Preprocedure Evaluation (Signed)
Anesthesia Evaluation  Patient identified by MRN, date of birth, ID band Patient awake    Reviewed: Allergy & Precautions, NPO status , Patient's Chart, lab work & pertinent test results  History of Anesthesia Complications Negative for: history of anesthetic complications  Airway Mallampati: II       Dental   Pulmonary asthma (as a child, no problems in years) , former smoker,           Cardiovascular (-) hypertension(-) Past MI and (-) CHF (-) dysrhythmias (-) Valvular Problems/Murmurs     Neuro/Psych neg Seizures Anxiety Depression    GI/Hepatic Neg liver ROS, neg GERD  ,  Endo/Other  neg diabetes  Renal/GU negative Renal ROS     Musculoskeletal   Abdominal   Peds  Hematology   Anesthesia Other Findings   Reproductive/Obstetrics                             Anesthesia Physical Anesthesia Plan  ASA: II  Anesthesia Plan: General   Post-op Pain Management:    Induction: Intravenous  PONV Risk Score and Plan: 3 and TIVA, Propofol infusion and Ondansetron  Airway Management Planned: Nasal Cannula  Additional Equipment:   Intra-op Plan:   Post-operative Plan:   Informed Consent: I have reviewed the patients History and Physical, chart, labs and discussed the procedure including the risks, benefits and alternatives for the proposed anesthesia with the patient or authorized representative who has indicated his/her understanding and acceptance.     Plan Discussed with:   Anesthesia Plan Comments:         Anesthesia Quick Evaluation

## 2017-03-27 NOTE — Op Note (Signed)
[facility] Gastroenterology Patient Name: Leah [name] Procedure Date: 03/27/2017 10:09 AM MRN: [id number] Account #: [id number] Date of Birth: 09/24/1965 Admit Type: Outpatient Age: 52 Room: [facility] ENDO ROOM 4 Gender: Female Note Status: Finalized Procedure:            Colonoscopy Indications:          Weight loss Providers:            Jonathon Bellows MD, MD Referring MD:         Nino Glow Mclean-Scocuzza MD, MD (Referring MD) Medicines:            Monitored Anesthesia Care Complications:        No immediate complications. Procedure:            Pre-Anesthesia Assessment:                       - Prior to the procedure, a History and Physical was                        performed, and patient medications, allergies and                        sensitivities were reviewed. The patient's tolerance of                        previous anesthesia was reviewed.                       - The risks and benefits of the procedure and the                        sedation options and risks were discussed with the                        patient. All questions were answered and informed                        consent was obtained.                       - ASA Grade Assessment: III - A patient with severe                        systemic disease.                       After obtaining informed consent, the colonoscope was                        passed under direct vision. Throughout the procedure,                        the patient's blood pressure, pulse, and oxygen                        saturations were monitored continuously. The                        Colonoscope was introduced through the anus and  advanced to the the cecum, identified by the                        appendiceal orifice, IC valve and transillumination.                        The colonoscopy was performed with ease. The patient                        tolerated the procedure well. The quality of the  bowel                        preparation was good. Findings:      The perianal and digital rectal examinations were normal.      A few small-mouthed diverticula were found in the descending colon.      Non-bleeding internal hemorrhoids were found during retroflexion. The       hemorrhoids were medium-sized and Grade I (internal hemorrhoids that do       not prolapse).      Two sessile polyps were found in the transverse colon and hepatic       flexure. The polyps were 3 to 4 mm in size. These polyps were removed       with a cold biopsy forceps. Resection and retrieval were complete.      The exam was otherwise without abnormality on direct and retroflexion       views. Impression:           - Diverticulosis in the descending colon.                       - Non-bleeding internal hemorrhoids.                       - Two 3 to 4 mm polyps in the transverse colon and at                        the hepatic flexure, removed with a cold biopsy                        forceps. Resected and retrieved.                       - The examination was otherwise normal on direct and                        retroflexion views. Recommendation:       - Discharge patient to home (with escort).                       - Resume previous diet.                       - Continue present medications.                       - Await pathology results.                       - Repeat colonoscopy in 5-10 years for surveillance  based on pathology results.                       - Return to GI office in 6 weeks. Procedure Code(s):    --- Professional ---                       [phone], Colonoscopy, flexible; with biopsy, single or                        multiple Diagnosis Code(s):    --- Professional ---                       K64.0, First degree hemorrhoids                       D12.3, Benign neoplasm of transverse colon (hepatic                        flexure or splenic flexure)                        R63.4, Abnormal weight loss                       K57.30, Diverticulosis of large intestine without                        perforation or abscess without bleeding CPT copyright 2016 American Medical Association. All rights reserved. The codes documented in this report are preliminary and upon coder review may  be revised to meet current compliance requirements. Jonathon Bellows, MD Jonathon Bellows MD, MD 03/27/2017 10:41:29 AM This report has been signed electronically. Number of Addenda: 0 Note Initiated On: 03/27/2017 10:09 AM Scope Withdrawal Time: 0 hours 12 minutes 10 seconds  Total Procedure Duration: 0 hours 14 minutes 6 seconds       [facility]

## 2017-03-27 NOTE — Anesthesia Postprocedure Evaluation (Signed)
Anesthesia Post Note  Patient: Leah [name]  Procedure(s) Performed: ESOPHAGOGASTRODUODENOSCOPY (EGD) WITH PROPOFOL WITH DILATION (N/A ) COLONOSCOPY WITH PROPOFOL (N/A )  Patient location during evaluation: Endoscopy Anesthesia Type: General Level of consciousness: awake and alert Pain management: pain level controlled Vital Signs Assessment: post-procedure vital signs reviewed and stable Respiratory status: spontaneous breathing and respiratory function stable Cardiovascular status: stable Anesthetic complications: no     Last Vitals:  Vitals:   03/27/17 1055 03/27/17 1105  BP: 113/78 118/90  Pulse: 84 60  Resp: 19 17  Temp:    SpO2: 100% 99%    Last Pain:  Vitals:   03/27/17 1045  TempSrc: Tympanic                 KEPHART,WILLIAM K

## 2017-03-27 NOTE — Op Note (Signed)
[facility] Gastroenterology Patient Name: Leah [name] Procedure Date: 03/27/2017 10:10 AM MRN: [id number] Account #: [id number] Date of Birth: [birth year] Admit Type: Outpatient Age: 52 Room: [facility] ENDO ROOM 4 Gender: Female Note Status: Finalized Procedure:            Upper GI endoscopy Indications:          Dysphagia Providers:            Jonathon Bellows MD, MD Referring MD:         Nino Glow Mclean-Scocuzza MD, MD (Referring MD) Medicines:            Monitored Anesthesia Care Complications:        No immediate complications. Procedure:            Pre-Anesthesia Assessment:                       - Prior to the procedure, a History and Physical was                        performed, and patient medications, allergies and                        sensitivities were reviewed. The patient's tolerance of                        previous anesthesia was reviewed.                       - The risks and benefits of the procedure and the                        sedation options and risks were discussed with the                        patient. All questions were answered and informed                        consent was obtained.                       - ASA Grade Assessment: III - A patient with severe                        systemic disease.                       After obtaining informed consent, the endoscope was                        passed under direct vision. Throughout the procedure,                        the patient's blood pressure, pulse, and oxygen                        saturations were monitored continuously. The Endoscope                        was introduced through the mouth, and advanced to the  third part of duodenum. The upper GI endoscopy was                        accomplished with ease. The patient tolerated the                        procedure well. Findings:      The examined duodenum was normal.      The stomach was normal.      The  examined esophagus was normal. Biopsies were taken with a cold       forceps for histology.      The cardia and gastric fundus were normal on retroflexion. Impression:           - Normal examined duodenum.                       - Normal stomach.                       - Normal esophagus. Biopsied. Recommendation:       - Await pathology results. Procedure Code(s):    --- Professional ---                       [phone], Esophagogastroduodenoscopy, flexible, transoral;                        with biopsy, single or multiple Diagnosis Code(s):    --- Professional ---                       R13.10, Dysphagia, unspecified CPT copyright 2016 American Medical Association. All rights reserved. The codes documented in this report are preliminary and upon coder review may  be revised to meet current compliance requirements. Jonathon Bellows, MD Jonathon Bellows MD, MD 03/27/2017 10:22:31 AM This report has been signed electronically. Number of Addenda: 0 Note Initiated On: 03/27/2017 10:10 AM      [facility]

## 2017-03-28 ENCOUNTER — Encounter: Payer: Self-pay | Admitting: Gastroenterology

## 2017-03-28 LAB — SURGICAL PATHOLOGY

## 2017-03-29 ENCOUNTER — Other Ambulatory Visit: Payer: Self-pay | Admitting: Internal Medicine

## 2017-03-29 ENCOUNTER — Ambulatory Visit
Admission: RE | Admit: 2017-03-29 | Discharge: 2017-03-29 | Disposition: A | Discharge status: Home / Self Care | Payer: BLUE CROSS/BLUE SHIELD | Source: Ambulatory Visit | Attending: Internal Medicine | Admitting: Internal Medicine

## 2017-03-29 DIAGNOSIS — I70208 Unspecified atherosclerosis of native arteries of extremities, other extremity: Secondary | ICD-10-CM | POA: Insufficient documentation

## 2017-03-29 DIAGNOSIS — N3090 Cystitis, unspecified without hematuria: Secondary | ICD-10-CM

## 2017-03-29 DIAGNOSIS — R109 Unspecified abdominal pain: Secondary | ICD-10-CM | POA: Diagnosis present

## 2017-03-29 DIAGNOSIS — Z9889 Other specified postprocedural states: Secondary | ICD-10-CM | POA: Insufficient documentation

## 2017-03-29 DIAGNOSIS — N200 Calculus of kidney: Secondary | ICD-10-CM | POA: Insufficient documentation

## 2017-03-29 DIAGNOSIS — K8689 Other specified diseases of pancreas: Secondary | ICD-10-CM

## 2017-03-29 DIAGNOSIS — I700 Atherosclerosis of aorta: Secondary | ICD-10-CM | POA: Insufficient documentation

## 2017-03-29 MED ORDER — IOPAMIDOL (ISOVUE-300) INJECTION 61%
100.0000 mL | Freq: Once | INTRAVENOUS | Status: AC | PRN
  Administered 2017-03-29: 100 mL via INTRAVENOUS

## 2017-03-30 ENCOUNTER — Other Ambulatory Visit: Payer: BLUE CROSS/BLUE SHIELD

## 2017-03-30 DIAGNOSIS — N3090 Cystitis, unspecified without hematuria: Secondary | ICD-10-CM

## 2017-03-31 LAB — URINE CULTURE
MICRO NUMBER:: 90072288
SPECIMEN QUALITY:: ADEQUATE

## 2017-04-04 ENCOUNTER — Encounter: Payer: Self-pay | Admitting: Internal Medicine

## 2017-04-04 ENCOUNTER — Other Ambulatory Visit (HOSPITAL_COMMUNITY)
Admission: RE | Admit: 2017-04-04 | Discharge: 2017-04-04 | Disposition: A | Discharge status: Home / Self Care | Payer: BLUE CROSS/BLUE SHIELD | Source: Ambulatory Visit | Attending: Internal Medicine | Admitting: Internal Medicine

## 2017-04-04 ENCOUNTER — Ambulatory Visit: Payer: BLUE CROSS/BLUE SHIELD | Admitting: Internal Medicine

## 2017-04-04 VITALS — BP 98/68 | HR 82 | Temp 98.3°F | Resp 12 | Ht 63.0 in | Wt 144.0 lb

## 2017-04-04 DIAGNOSIS — R1310 Dysphagia, unspecified: Secondary | ICD-10-CM

## 2017-04-04 DIAGNOSIS — Z124 Encounter for screening for malignant neoplasm of cervix: Secondary | ICD-10-CM

## 2017-04-04 DIAGNOSIS — E785 Hyperlipidemia, unspecified: Secondary | ICD-10-CM | POA: Insufficient documentation

## 2017-04-04 DIAGNOSIS — I700 Atherosclerosis of aorta: Secondary | ICD-10-CM | POA: Diagnosis not present

## 2017-04-04 DIAGNOSIS — N6002 Solitary cyst of left breast: Secondary | ICD-10-CM | POA: Diagnosis not present

## 2017-04-04 DIAGNOSIS — E781 Pure hyperglyceridemia: Secondary | ICD-10-CM

## 2017-04-04 MED ORDER — OMEGA-3-ACID ETHYL ESTERS 1 G PO CAPS: 2.0000 g | ORAL_CAPSULE | Freq: Two times a day (BID) | ORAL | 3 refills | Status: DC

## 2017-04-04 NOTE — Patient Instructions (Signed)
Please take care f/u in 6 months   Bacterial Vaginosis Bacterial vaginosis is a vaginal infection that occurs when the normal balance of bacteria in the vagina is disrupted. It results from an overgrowth of certain bacteria. This is the most common vaginal infection among women ages 25-44. Because bacterial vaginosis increases your risk for STIs (sexually transmitted infections), getting treated can help reduce your risk for chlamydia, gonorrhea, herpes, and HIV (human immunodeficiency virus). Treatment is also important for preventing complications in pregnant women, because this condition can cause an early (premature) delivery. What are the causes? This condition is caused by an increase in harmful bacteria that are normally present in small amounts in the vagina. However, the reason that the condition develops is not fully understood. What increases the risk? The following factors may make you more likely to develop this condition:  Having a new sexual partner or multiple sexual partners.  Having unprotected sex.  Douching.  Having an intrauterine device (IUD).  Smoking.  Drug and alcohol abuse.  Taking certain antibiotic medicines.  Being pregnant.  You cannot get bacterial vaginosis from toilet seats, bedding, swimming pools, or contact with objects around you. What are the signs or symptoms? Symptoms of this condition include:  Grey or white vaginal discharge. The discharge can also be watery or foamy.  A fish-like odor with discharge, especially after sexual intercourse or during menstruation.  Itching in and around the vagina.  Burning or pain with urination.  Some women with bacterial vaginosis have no signs or symptoms. How is this diagnosed? This condition is diagnosed based on:  Your medical history.  A physical exam of the vagina.  Testing a sample of vaginal fluid under a microscope to look for a large amount of bad bacteria or abnormal cells. Your health  care provider may use a cotton swab or a small wooden spatula to collect the sample.  How is this treated? This condition is treated with antibiotics. These may be given as a pill, a vaginal cream, or a medicine that is put into the vagina (suppository). If the condition comes back after treatment, a second round of antibiotics may be needed. Follow these instructions at home: Medicines  Take over-the-counter and prescription medicines only as told by your health care provider.  Take or use your antibiotic as told by your health care provider. Do not stop taking or using the antibiotic even if you start to feel better. General instructions  If you have a female sexual partner, tell her that you have a vaginal infection. She should see her health care provider and be treated if she has symptoms. If you have a female sexual partner, he does not need treatment.  During treatment: ? Avoid sexual activity until you finish treatment. ? Do not douche. ? Avoid alcohol as directed by your health care provider. ? Avoid breastfeeding as directed by your health care provider.  Drink enough water and fluids to keep your urine clear or pale yellow.  Keep the area around your vagina and rectum clean. ? Wash the area daily with warm water. ? Wipe yourself from front to back after using the toilet.  Keep all follow-up visits as told by your health care provider. This is important. How is this prevented?  Do not douche.  Wash the outside of your vagina with warm water only.  Use protection when having sex. This includes latex condoms and dental dams.  Limit how many sexual partners you have. To help prevent bacterial  vaginosis, it is best to have sex with just one partner (monogamous).  Make sure you and your sexual partner are tested for STIs.  Wear cotton or cotton-lined underwear.  Avoid wearing tight pants and pantyhose, especially during summer.  Limit the amount of alcohol that you  drink.  Do not use any products that contain nicotine or tobacco, such as cigarettes and e-cigarettes. If you need help quitting, ask your health care provider.  Do not use illegal drugs. Where to find more information:  Centers for Disease Control and Prevention: AppraiserFraud.fi  American Sexual Health Association (ASHA): www.ashastd.org  [place] Department of Health and Financial controller, Office on Women's Health: DustingSprays.pl or SecuritiesCard.it Contact a health care provider if:  Your symptoms do not improve, even after treatment.  You have more discharge or pain when urinating.  You have a fever.  You have pain in your abdomen.  You have pain during sex.  You have vaginal bleeding between periods. Summary  Bacterial vaginosis is a vaginal infection that occurs when the normal balance of bacteria in the vagina is disrupted.  Because bacterial vaginosis increases your risk for STIs (sexually transmitted infections), getting treated can help reduce your risk for chlamydia, gonorrhea, herpes, and HIV (human immunodeficiency virus). Treatment is also important for preventing complications in pregnant women, because the condition can cause an early (premature) delivery.  This condition is treated with antibiotic medicines. These may be given as a pill, a vaginal cream, or a medicine that is put into the vagina (suppository). This information is not intended to replace advice given to you by your health care provider. Make sure you discuss any questions you have with your health care provider. Document Released: 02/28/2005 Document Revised: 07/04/2016 Document Reviewed: 11/14/2015 Elsevier Interactive Patient Education  [employer].

## 2017-04-04 NOTE — Progress Notes (Signed)
Chief Complaint  Patient presents with  . Gynecologic Exam   F/u  1. Pap due today. She does report h/o abnormal pap in the past 1x. Recent BV+ took Flagyl x 1 week. Breast exam 03/08/17 visit was done  2. Elevated triglycerides not on tx TC 202, LDL 119, TGS 171, HDL 48.90 3. Received CT ab/pelvis previous calcifications noted on prior imaging not detected on CT ab/pelvis 4. She c/o dysphagia and food is getting stuck recently had EGD with GI negative path for eosinophillic esophagitis which she has had in the past. She is on PPI pilosec 40 mg qd.    Review of Systems  Constitutional: Negative for weight loss.  HENT:       +dysphagia   Eyes:       No vision issues  Respiratory: Negative for shortness of breath.   Cardiovascular: Negative for chest pain.  Gastrointestinal: Negative for abdominal pain.  Musculoskeletal: Negative for falls.  Skin: Negative for rash.  Neurological: Negative for headaches.  Psychiatric/Behavioral: Negative for memory loss.   Past Medical History:  Diagnosis Date  . Anxiety   . Asthma   . Carpal tunnel syndrome    R hand   . Depression   . Esophageal stricture    s/p dilatation   . Herniated disc, cervical    s/p epidural injection  . Hyperlipidemia   . Low back pain   . Migraines    Past Surgical History:  Procedure Laterality Date  . CESAREAN SECTION    . COLONOSCOPY WITH PROPOFOL N/A 03/27/2017   Procedure: COLONOSCOPY WITH PROPOFOL;  Surgeon: Jonathon Bellows, MD;  Location: [facility] ENDOSCOPY;  Service: Gastroenterology;  Laterality: N/A;  . ESOPHAGOGASTRODUODENOSCOPY    . ESOPHAGOGASTRODUODENOSCOPY (EGD) WITH PROPOFOL N/A 03/27/2017   Procedure: ESOPHAGOGASTRODUODENOSCOPY (EGD) WITH PROPOFOL WITH DILATION;  Surgeon: Jonathon Bellows, MD;  Location: [facility] ENDOSCOPY;  Service: Gastroenterology;  Laterality: N/A;  . THROAT SURGERY  2015  . UMBILICAL HERNIA REPAIR  2006    x 2   w mesh per pt   Family History  Problem Relation Age of Onset  . Breast  cancer Mother   . Thyroid disease Mother   . Prostate cancer Father   . Cancer - Prostate Father   . Breast cancer Maternal Grandmother    Social History   Socioeconomic History  . Marital status: Significant Other    Spouse name: Not on file  . Number of children: Not on file  . Years of education: Not on file  . Highest education level: Not on file  Social Needs  . Financial resource strain: Not on file  . Food insecurity - worry: Not on file  . Food insecurity - inability: Not on file  . Transportation needs - medical: Not on file  . Transportation needs - non-medical: Not on file  Occupational History  . Not on file  Tobacco Use  . Smoking status: Former Smoker    Packs/day: 1.00    Years: 20.00    Pack years: 20.00    Last attempt to quit: 06/02/2006    Years since quitting: 10.8  . Smokeless tobacco: Never Used  Substance and Sexual Activity  . Alcohol use: Yes    Alcohol/week: 1.2 - 1.8 oz    Types: 2 - 3 Standard drinks or equivalent per week    Comment: socially  . Drug use: No  . Sexual activity: Yes    Partners: Male  Other Topics Concern  . Not on file  Social History Narrative   Works in Scientist, research (medical)    2 kids 20 and 52 son and daughter live in [place]. As of 02/26/17    Divorced.    Former smoker 20+ years quit in 1997 then started again for 5 years and quit 10-12 years ago as of 03/08/17    Current Meds  Medication Sig  . Acetaminophen (TYLENOL GO TABS EXTRA STRENGTH PO) Take by mouth every 8 (eight) hours.  Marland Kitchen ibuprofen (ADVIL,MOTRIN) 800 MG tablet Take 1 tablet (800 mg total) every 8 (eight) hours as needed by mouth.  . methocarbamol (ROBAXIN) 750 MG tablet TAKE 1 TABLET BY MOUTH EVERY 6 HOURS AS NEEDED FOR MUSCLE SPASMS  . omeprazole (PRILOSEC) 40 MG capsule Take 1 capsule (40 mg total) by mouth daily.   Allergies  Allergen Reactions  . Penicillins   . Sulfa Antibiotics    Recent Results (from the past 2160 hour(s))  CBC     Status: None   Collection  Time: 02/17/17  9:40 AM  Result Value Ref Range   WBC 9.4 3.6 - 11.0 K/uL   RBC 4.56 3.80 - 5.20 MIL/uL   Hemoglobin 14.5 12.0 - 16.0 g/dL   HCT 41.2 35.0 - 47.0 %   MCV 90.4 80.0 - 100.0 fL   MCH 31.8 26.0 - 34.0 pg   MCHC 35.2 32.0 - 36.0 g/dL   RDW 12.1 11.5 - 14.5 %   Platelets 371 150 - 440 K/uL  Urine cytology ancillary only     Status: None   Collection Time: 03/08/17 12:00 AM  Result Value Ref Range   Chlamydia Negative     Comment: Normal Reference Range - Negative   Neisseria gonorrhea Negative     Comment: Normal Reference Range - Negative   Trichomonas Negative     Comment: Normal Reference Range - Negative  Urine cytology ancillary only     Status: Abnormal   Collection Time: 03/08/17 12:00 AM  Result Value Ref Range   Bacterial vaginitis **POSITIVE for Gardnerella vaginalis** (A)     Comment: Normal Reference Range - Negative   Candida vaginitis Negative for Candida Vaginitis Microorganisms     Comment: Normal Reference Range - Negative  Comprehensive metabolic panel     Status: None   Collection Time: 03/08/17  8:45 AM  Result Value Ref Range   Sodium 137 135 - 145 mEq/L   Potassium 3.7 3.5 - 5.1 mEq/L   Chloride 100 96 - 112 mEq/L   CO2 29 19 - 32 mEq/L   Glucose, Bld 96 70 - 99 mg/dL   BUN 18 6 - 23 mg/dL   Creatinine, Ser 0.64 0.40 - 1.20 mg/dL   Total Bilirubin 0.8 0.2 - 1.2 mg/dL   Alkaline Phosphatase 43 39 - 117 U/L   AST 12 0 - 37 U/L   ALT 11 0 - 35 U/L   Total Protein 7.3 6.0 - 8.3 g/dL   Albumin 4.3 3.5 - 5.2 g/dL   Calcium 9.7 8.4 - 10.5 mg/dL   GFR 104.79 >60.00 mL/min  T4, free     Status: None   Collection Time: 03/08/17  8:45 AM  Result Value Ref Range   Free T4 0.84 0.60 - 1.60 ng/dL    Comment: Specimens from patients who are undergoing biotin therapy and /or ingesting biotin supplements may contain high levels of biotin.  The higher biotin concentration in these specimens interferes with this Free T4 assay.  Specimens that contain  high levels  of biotin may cause false high results for this Free T4 assay.  Please interpret results in light of the total clinical presentation of the patient.    TSH     Status: None   Collection Time: 03/08/17  8:45 AM  Result Value Ref Range   TSH 1.92 0.35 - 4.50 uIU/mL  Urinalysis, Routine w reflex microscopic     Status: Abnormal   Collection Time: 03/08/17  8:45 AM  Result Value Ref Range   Color, Urine YELLOW Yellow;Lt. Yellow   APPearance Sl Cloudy (A) Clear   Specific Gravity, Urine 1.010 1.000 - 1.030   pH 6.0 5.0 - 8.0   Total Protein, Urine NEGATIVE Negative   Urine Glucose NEGATIVE Negative   Ketones, ur NEGATIVE Negative   Bilirubin Urine NEGATIVE Negative   Hgb urine dipstick MODERATE (A) Negative   Urobilinogen, UA 0.2 0.0 - 1.0   Leukocytes, UA SMALL (A) Negative   Nitrite NEGATIVE Negative   WBC, UA 11-20/hpf (A) 0-2/hpf   RBC / HPF 7-10/hpf (A) 0-2/hpf   Mucus, UA Presence of (A) None   Squamous Epithelial / LPF Rare(0-4/hpf) Rare(0-4/hpf)   Bacteria, UA Rare(<10/hpf) (A) None  Lipid panel     Status: Abnormal   Collection Time: 03/08/17  8:45 AM  Result Value Ref Range   Cholesterol 202 (H) 0 - 200 mg/dL    Comment: ATP III Classification       Desirable:  < 200 mg/dL               Borderline High:  200 - 239 mg/dL          High:  > = 240 mg/dL   Triglycerides 171.0 (H) 0.0 - 149.0 mg/dL    Comment: Normal:  <150 mg/dLBorderline High:  150 - 199 mg/dL   HDL 48.90 >39.00 mg/dL   VLDL 34.2 0.0 - 40.0 mg/dL   LDL Cholesterol 119 (H) 0 - 99 mg/dL   Total CHOL/HDL Ratio 4     Comment:                Men          Women1/2 Average Risk     3.4          3.3Average Risk          5.0          4.42X Average Risk          9.6          7.13X Average Risk          15.0          11.0                       NonHDL 153.57     Comment: NOTE:  Non-HDL goal should be 30 mg/dL higher than patient's LDL goal (i.e. LDL goal of < 70 mg/dL, would have non-HDL goal of < 100 mg/dL)   Hepatitis B surface antibody     Status: Abnormal   Collection Time: 03/08/17  8:45 AM  Result Value Ref Range   Hepatitis B-Post <5 (L) > OR = 10 mIU/mL    Comment: . Patient does not have immunity to hepatitis B virus. . For additional information, please refer to http://education.[facility].com/faq/FAQ105 (This link is being provided for informational/ educational purposes only).   Hepatitis B Core Antibody, total     Status: None   Collection Time: 03/08/17  8:45 AM  Result Value Ref Range   Hep B Core Total Ab NON-REACTIVE NON-REACTI  Hepatitis B surface antigen     Status: None   Collection Time: 03/08/17  8:45 AM  Result Value Ref Range   Hepatitis B Surface Ag NON-REACTIVE NON-REACTI  Antinuclear Antib (ANA)     Status: None   Collection Time: 03/08/17  8:45 AM  Result Value Ref Range   Anit Nuclear Antibody(ANA) NEGATIVE NEGATIVE    Comment: ANA IFA is a first line screen for detecting the presence of up to approximately 150 autoantibodies in various autoimmune diseases. A negative ANA IFA result suggests ANA-associated autoimmune diseases are not present at this time. . Visit Physician FAQs for interpretation of all antibodies in the [place], prevalence, and association with diseases at http://education.[facility].com/ OZD/GUY403 .   C-reactive protein     Status: Abnormal   Collection Time: 03/08/17  8:45 AM  Result Value Ref Range   CRP 0.1 (L) 0.5 - 20.0 mg/dL  Sedimentation rate     Status: Abnormal   Collection Time: 03/08/17  8:45 AM  Result Value Ref Range   Sed Rate 32 (H) 0 - 20 mm/hr  Rheumatoid Factor     Status: None   Collection Time: 03/08/17  8:45 AM  Result Value Ref Range   Rhuematoid fact SerPl-aCnc <47 <42 IU/mL  Cyclic citrul peptide antibody, IgG     Status: None   Collection Time: 03/08/17  8:45 AM  Result Value Ref Range   Cyclic Citrullin Peptide Ab <16 UNITS    Comment: Reference Range Negative:            <20 Weak  Positive:       20-39 Moderate Positive:   40-59 Strong Positive:     >59 .   HIV antibody     Status: None   Collection Time: 03/08/17  8:48 AM  Result Value Ref Range   HIV 1&2 Ab, 4th Generation NON-REACTIVE NON-REACTI    Comment: HIV-1 antigen and HIV-1/HIV-2 antibodies were not detected. There is no laboratory evidence of HIV infection. Marland Kitchen PLEASE NOTE: This information has been disclosed to you from records whose confidentiality may be protected by state law.  If your state requires such protection, then the state law prohibits you from making any further disclosure of the information without the specific written consent of the person to whom it pertains, or as otherwise permitted by law. A general authorization for the release of medical or other information is NOT sufficient for this purpose. . For additional information please refer to http://education.[facility].com/faq/FAQ106 (This link is being provided for informational/ educational purposes only.) . Marland Kitchen The performance of this assay has not been clinically validated in patients less than 3 years old. .   Hepatitis C antibody     Status: None   Collection Time: 03/08/17  8:48 AM  Result Value Ref Range   Hepatitis C Ab NON-REACTIVE NON-REACTI   SIGNAL TO CUT-OFF 0.01 <1.00  RPR     Status: None   Collection Time: 03/08/17  8:48 AM  Result Value Ref Range   RPR Ser Ql NON-REACTIVE NON-REACTI  HSV 2 antibody, IgG     Status: Abnormal   Collection Time: 03/08/17  8:48 AM  Result Value Ref Range   HSV 2 Glycoprotein G Ab, IgG >23.00 (H) index    Comment:  Index          Interpretation                           -----          --------------                           <0.90          Negative                           0.90-1.09      Equivocal                           >1.09          Positive . This assay utilizes recombinant type-specific antigens to differentiate HSV-1 from HSV-2  infections. A positive result cannot distinguish between recent and past infection. If recent HSV infection is suspected but the results are negative or equivocal, the assay should be repeated in 4-6 weeks. The performance characteristics of the assay have not been established for pediatric populations, immunocompromised patients, or neonatal screening.   HSV 1 antibody, IgG     Status: Abnormal   Collection Time: 03/08/17  8:48 AM  Result Value Ref Range   HSV 1 Glycoprotein G Ab, IgG 3.48 (H) index    Comment:                           Index          Interpretation                           -----          --------------                           <0.90          Negative                           0.90-1.09      Equivocal                           >1.09          Positive . This assay utilizes recombinant type-specific antigens to differentiate HSV-1 from HSV-2 infections. A positive result cannot distinguish between recent and past infection. If recent HSV infection is suspected but the results are negative or equivocal, the assay should be repeated in 4-6 weeks. The performance characteristics of the assay have not been established for pediatric populations, immunocompromised patients, or neonatal screening.   Surgical pathology     Status: None   Collection Time: 03/27/17 10:16 AM  Result Value Ref Range   SURGICAL PATHOLOGY      Surgical Pathology CASE: [id number] PATIENT: Neale Burly Surgical Pathology Report     SPECIMEN SUBMITTED: A. Esophagus, R/O EOE; cbx B. Colon polyp, hepatic flexure; cbx C. Colon polyp, transverse; cbx  CLINICAL HISTORY: None provided  PRE-OPERATIVE DIAGNOSIS: Abnormal weight loss, dysphagia  POST-OPERATIVE DIAGNOSIS: Normal EGD, colon polyps, diverticulosis, hemorrhoids     DIAGNOSIS: A. ESOPHAGUS; COLD BIOPSY: - SQUAMOUS MUCOSA WITH FOCAL GLYCOGEN  ACANTHOSIS. - SEE COMMENT. - NEGATIVE FOR EOSINOPHILS, DYSPLASIA, AND  MALIGNANCY.  B. COLON POLYP, HEPATIC FLEXURE; COLD BIOPSY: - POLYPOID FRAGMENT OF FOCAL MILD ACTIVE COLITIS WITH UNDERLYING PROMINENT REACTIVE LYMPHOID AGGREGATE. - NEGATIVE FOR DYSPLASIA AND MALIGNANCY.  C. COLON POLYP, TRANSVERSE; COLD BIOPSY: - SESSILE SERRATED ADENOMA. - FOCAL PROMINENT LYMPHOID AGGREGATE. - NEGATIVE FOR HIGH-GRADE DYSPLASIA AND MALIGNANCY.  Comments: On endoscopy, glycogen acanthosis can appear as a whit e colored plaque and represents a mild increase in glycogen within the squamous cells. Glycogen acanthosis is a non-specific finding but can mimic some of the endoscopic findings of eosinophilic esophagitis.  Sessile serrated adenomas (SSAs) are often difficult to detect endoscopically as they are typically broad flat lesions found in the proximal colon. By morphologic definition, SSAs demonstrate architectural (low grade) dysplasia, with or without concurrent cytologic dysplasia. SSAs are thought to be precursor lesions to a subset of colonic adenocarcinomas that arise through the serrated neoplastic pathway rather than the classical neoplasia pathway. Lesions of the serrated pathway have a high frequency of BRAF mutation and are more likely to be microsatellite unstable compared to tubular / villous adenomas of the classical pathway.  The recommended surveillance interval for a SSA <10 mm with no cytologic dysplasia is 5 years.  A SSA ?10 mm and a sessile serrated  polyp with cytological dysplasia should be managed like a high risk adenoma (recommended surveillance interval of 3 years).  Serrated polyposis syndrome, as defined by WHO, should be considered with one of the following criteria: (1) at least 5 serrated polyps proximal to sigmoid, with 2 or more ?10 mm; (2) any serrated polyps proximal to sigmoid with family history of serrated polyposis syndrome; and (3) >20 serrated polyps of any size throughout the colon.  References: Guidelines for  Colonoscopy Surveillance After Screening and Polypectomy: A Consensus Update by the [place] Multi-Society Task Force on Colorectal Cancer. [place] [facility] [employer], 2012.    GROSS DESCRIPTION:  A. Labeled: cold biopsy of esophagus rule out EOE  Tissue fragment(s): multiple  Size: aggregate, 1.0 x 0.6 x 0.1 cm  Description: in formalin, tan fragments  Entirely submitted in one cassette(s).   B. Labeled: C BX hepatic flexure polyp  Tissue fragment(s): 3  Size: 0.2 -0.3 cm  Description: in formalin, pink-tan fragments  Entirely submitted in 1 cassette(s).  C. Labeled: C BX transverse colon polyp  Tissue fragment(s): 1  Size: 0.6 cm  Description: in formalin, pink-tan fragment  Entirely submitted in 1 cassette(s).  Final Diagnosis performed by Quay Burow, MD.  Electronically signed 03/28/2017 9:54:29AM    The electronic signature indicates that the named Attending Pathologist has evaluated the specimen  Technical component performed at [facility], [address], [place], [place] [address] Lab: [phone] Dir: Rush Farmer, MD, MMM  Professional component performed at [facility], [facility], [place], [place], [place] [address] Lab: [phone] Dir: Dellia Nims. Reuel Derby, MD    Urine Culture     Status: None   Collection Time: 03/30/17 10:28 AM  Result Value Ref Range   MICRO NUMBER: [id number]    SPECIMEN QUALITY: ADEQUATE    Sample Source NOT GIVEN    STATUS: FINAL    ISOLATE 1:      Single organism less than 10,000 CFU/mL isolated. These organisms, commonly found on external and internal genitalia, are considered colonizers. No further testing performed.   Objective  Body mass index is 25.51 kg/m. Wt Readings from Last 3 Encounters:  04/04/17 144 lb (65.3 kg)  03/27/17 139 lb (63 kg)  03/09/17 144 lb (65.3 kg)   Temp Readings from Last 3 Encounters:  04/04/17 98.3 F (36.8 C) (Oral)  03/27/17 (!) 96.6 F (35.9 C)  (Tympanic)  03/09/17 98.4 F (36.9 C) (Oral)   BP Readings from Last 3 Encounters:  04/04/17 98/68  03/27/17 119/88  03/09/17 108/66   Pulse Readings from Last 3 Encounters:  04/04/17 82  03/27/17 62  03/09/17 76   O2 sat room air 98% Physical Exam  Constitutional: She is oriented to person, place, and time and well-developed, well-nourished, and in no distress. Vital signs are normal.  HENT:  Head: Normocephalic and atraumatic.  Mouth/Throat: Oropharynx is clear and moist and mucous membranes are normal.  Eyes: Conjunctivae are normal. Pupils are equal, round, and reactive to light.  Cardiovascular: Normal rate, regular rhythm and normal heart sounds.  Pulmonary/Chest: Effort normal and breath sounds normal.  See breast exam 03/08/17  Genitourinary: Uterus normal, cervix normal, right adnexa normal, left adnexa normal and vulva normal. Creamy  odorless  white  yellow and vaginal discharge found.  Genitourinary Comments: Cervix sl friable  Neurological: She is alert and oriented to person, place, and time. Gait normal. Gait normal.  Skin: Skin is warm, dry and intact.  Psychiatric: Mood, memory, affect and judgment normal.  Nursing note and vitals reviewed.   Assessment   1. Hypertriglceridemia  2. Dysphagia s/p EGD neg eosinophillic esophagitis  3. Aortic atherosclerosis noted on CT ab/pelvis  4. H/o BV tx'ed flagyl x 1 week 02/2017  5. HM Plan  1. Add lovaza 2g bid  Rec healthy diet and exercise  Repeat lipid in  6 months   2. Needs to call and f/u schedule with Dr. Bailey Mech  Cont PPI 3. Control lipid panel 4. NTD monitor for sx's will not retest today  5.  Had flu shot Tdap see last visit 03/08/17 UTD had Boostrix 08/17/15  rec hep B vaccine   EGD/colonoscopy had 03/27/17 Dr. Bailey Mech diverticulosis, serrated polyps neg EGD for eosinophillic esophagitis repeat colonoscopy due in  5years  Refer mammogram b/l dx mammo and Left [place] due 10/07/17  Pap today h/o abnormal x 1  in the past Former smoker congratulated on quitting     Provider: Dr. Olivia Mackie McLean-Scocuzza-Internal Medicine

## 2017-04-05 ENCOUNTER — Ambulatory Visit (INDEPENDENT_AMBULATORY_CARE_PROVIDER_SITE_OTHER): Payer: BLUE CROSS/BLUE SHIELD | Admitting: Psychology

## 2017-04-05 DIAGNOSIS — F329 Major depressive disorder, single episode, unspecified: Secondary | ICD-10-CM | POA: Diagnosis not present

## 2017-04-05 DIAGNOSIS — F419 Anxiety disorder, unspecified: Secondary | ICD-10-CM

## 2017-04-06 LAB — CYTOLOGY - PAP
Diagnosis: NEGATIVE
HPV: NOT DETECTED

## 2017-04-16 ENCOUNTER — Encounter: Payer: Self-pay | Admitting: Internal Medicine

## 2017-04-19 ENCOUNTER — Ambulatory Visit (INDEPENDENT_AMBULATORY_CARE_PROVIDER_SITE_OTHER): Payer: BLUE CROSS/BLUE SHIELD | Admitting: Psychology

## 2017-04-19 DIAGNOSIS — F329 Major depressive disorder, single episode, unspecified: Secondary | ICD-10-CM | POA: Diagnosis not present

## 2017-04-19 DIAGNOSIS — F419 Anxiety disorder, unspecified: Secondary | ICD-10-CM

## 2017-04-26 ENCOUNTER — Other Ambulatory Visit: Payer: Self-pay | Admitting: Internal Medicine

## 2017-04-26 DIAGNOSIS — M62838 Other muscle spasm: Secondary | ICD-10-CM

## 2017-04-26 NOTE — Telephone Encounter (Signed)
Refilled: 02/24/2017 Last OV: 04/04/2017 Next OV: 10/03/2017

## 2017-04-26 NOTE — Telephone Encounter (Signed)
Is she really taking this muscle relaxer every 6 hours? I want to try to reduce this to 2x per day max 3x per day  Is she agreeable?  [place]

## 2017-05-01 ENCOUNTER — Other Ambulatory Visit: Payer: Self-pay | Admitting: Internal Medicine

## 2017-05-01 DIAGNOSIS — M62838 Other muscle spasm: Secondary | ICD-10-CM

## 2017-05-02 MED ORDER — METHOCARBAMOL 750 MG PO TABS: 750.0000 mg | ORAL_TABLET | Freq: Four times a day (QID) | ORAL | 0 refills | Status: DC | PRN

## 2017-05-02 NOTE — Telephone Encounter (Signed)
Refilled: 02/24/2017 Last OV: 04/04/2017 Next OV: 10/03/2017

## 2017-05-03 ENCOUNTER — Ambulatory Visit (INDEPENDENT_AMBULATORY_CARE_PROVIDER_SITE_OTHER): Payer: BLUE CROSS/BLUE SHIELD | Admitting: Psychology

## 2017-05-03 DIAGNOSIS — F329 Major depressive disorder, single episode, unspecified: Secondary | ICD-10-CM

## 2017-05-03 DIAGNOSIS — F419 Anxiety disorder, unspecified: Secondary | ICD-10-CM | POA: Diagnosis not present

## 2017-05-04 NOTE — Telephone Encounter (Signed)
Please call pharmacy and confirm this went through and call pt to see if she is really taking this 4x per day or can we reduce to 2-3 x per day  [place]

## 2017-05-16 ENCOUNTER — Ambulatory Visit (INDEPENDENT_AMBULATORY_CARE_PROVIDER_SITE_OTHER): Payer: BLUE CROSS/BLUE SHIELD | Admitting: Gastroenterology

## 2017-05-16 ENCOUNTER — Encounter: Payer: Self-pay | Admitting: Gastroenterology

## 2017-05-16 ENCOUNTER — Telehealth: Payer: Self-pay | Admitting: Internal Medicine

## 2017-05-16 VITALS — BP 118/75 | HR 81 | Temp 98.1°F | Ht 63.0 in | Wt 145.0 lb

## 2017-05-16 DIAGNOSIS — R1310 Dysphagia, unspecified: Secondary | ICD-10-CM | POA: Diagnosis not present

## 2017-05-16 DIAGNOSIS — R1319 Other dysphagia: Secondary | ICD-10-CM

## 2017-05-16 NOTE — Progress Notes (Signed)
Jonathon Bellows MD, MRCP(U.K) [address]  [place]  [place], [place] [address]  Main: [phone]  Fax: [phone]   Primary Care Physician: McLean-Scocuzza, Nino Glow, MD  Primary Gastroenterologist:  Dr. Jonathon Bellows   Chief Complaint  Patient presents with  . Follow-up    HPI: Leah [name] is a 52 y.o. female    Summary of history :  She is here today to follow up for dysphagia. Initially referred and seen in 02/2016 for dysphagia of 3 years duration , worse last 1 year. Occurs once a week , affects solids and liquids, unintentionally losing weight ,   Interval history   12//2018-  05/16/2016   Weight stable. Swallowing : says 2 episodes when food got stuck ,    03/29/17 : CT abdomen : renal calculus , bladder wall thickening   No other issues   BP 118/75 (BP Location: Left Arm, Patient Position: Sitting, Cuff Size: Normal)   Pulse 81   Temp 98.1 F (36.7 C) (Oral)   Ht 5\' 3"  (1.6 m)   Wt 145 lb (65.8 kg)   BMI 25.69 kg/m   Current Outpatient Medications  Medication Sig Dispense Refill  . Acetaminophen (TYLENOL GO TABS EXTRA STRENGTH PO) Take by mouth every 8 (eight) hours.    . methocarbamol (ROBAXIN) 750 MG tablet Take 1 tablet (750 mg total) by mouth every 6 (six) hours as needed for muscle spasms. 120 tablet 0  . omega-3 acid ethyl esters (LOVAZA) 1 g capsule Take 2 capsules (2 g total) by mouth 2 (two) times daily. 120 capsule 3  . omeprazole (PRILOSEC) 40 MG capsule Take 1 capsule (40 mg total) by mouth daily. 90 capsule 3   No current facility-administered medications for this visit.     Allergies as of 05/16/2017 - Review Complete 05/16/2017  Allergen Reaction Noted  . Penicillins  06/01/2016  . Sulfa antibiotics  06/01/2016    ROS:  General: Negative for anorexia, weight loss, fever, chills, fatigue, weakness. ENT: Negative for hoarseness, difficulty swallowing , nasal congestion. CV: Negative for chest pain, angina, palpitations,  dyspnea on exertion, peripheral edema.  Respiratory: Negative for dyspnea at rest, dyspnea on exertion, cough, sputum, wheezing.  GI: See history of present illness. GU:  Negative for dysuria, hematuria, urinary incontinence, urinary frequency, nocturnal urination.  Endo: Negative for unusual weight change.    Physical Examination:   BP 118/75 (BP Location: Left Arm, Patient Position: Sitting, Cuff Size: Normal)   Pulse 81   Temp 98.1 F (36.7 C) (Oral)   Ht 5\' 3"  (1.6 m)   Wt 145 lb (65.8 kg)   BMI 25.69 kg/m   General: Well-nourished, well-developed in no acute distress.  Eyes: No icterus. Conjunctivae pink. Mouth: Oropharyngeal mucosa moist and pink , no lesions erythema or exudate. Lungs: Clear to auscultation bilaterally. Non-labored. Heart: Regular rate and rhythm, no murmurs rubs or gallops.  Abdomen: Bowel sounds are normal, nontender, nondistended, no hepatosplenomegaly or masses, no abdominal bruits or hernia , no rebound or guarding.   Extremities: No lower extremity edema. No clubbing or deformities. Neuro: Alert and oriented x 3.  Grossly intact. Skin: Warm and dry, no jaundice.   Psych: Alert and cooperative, normal mood and affect.   Imaging Studies: No results found.  Assessment and Plan:   Leah [name] is a 52 y.o. y/o female  here to follow up for  Dysphagia . Better but not resolved. Offered esophageal manometry to r/o motilityt disorders and  a modified barium swallow , she is not keen and if she changes her mind will return . No weight loss since last visit    Dr Jonathon Bellows  MD,MRCP [facility]) Follow up PRN

## 2017-05-16 NOTE — Telephone Encounter (Signed)
A [place] will is needed to go with pt Diag mammo. The img for [place] is [id number] [phone] and [phone] left and right. Please and Thank you!

## 2017-05-20 ENCOUNTER — Other Ambulatory Visit: Payer: Self-pay | Admitting: Family Medicine

## 2017-05-21 ENCOUNTER — Other Ambulatory Visit: Payer: Self-pay | Admitting: Internal Medicine

## 2017-05-21 ENCOUNTER — Encounter: Payer: Self-pay | Admitting: Gastroenterology

## 2017-05-21 DIAGNOSIS — N6002 Solitary cyst of left breast: Secondary | ICD-10-CM

## 2017-05-21 NOTE — Telephone Encounter (Signed)
Orders in due 10/07/17 b/l diagnostic mammogram and [place] b/l though last report only rec left breast US so check with [place] about this   Thanks [place]

## 2017-05-23 NOTE — Telephone Encounter (Signed)
Please advise ok to fill 

## 2017-05-24 ENCOUNTER — Ambulatory Visit (INDEPENDENT_AMBULATORY_CARE_PROVIDER_SITE_OTHER): Payer: BLUE CROSS/BLUE SHIELD | Admitting: Psychology

## 2017-05-24 DIAGNOSIS — F419 Anxiety disorder, unspecified: Secondary | ICD-10-CM

## 2017-05-24 DIAGNOSIS — F329 Major depressive disorder, single episode, unspecified: Secondary | ICD-10-CM

## 2017-05-31 ENCOUNTER — Other Ambulatory Visit: Payer: Self-pay | Admitting: Internal Medicine

## 2017-05-31 DIAGNOSIS — M62838 Other muscle spasm: Secondary | ICD-10-CM

## 2017-05-31 NOTE — Progress Notes (Signed)
Subjective:    Patient ID: Leah [name], female    DOB: 1965-06-22, 52 y.o.   MRN: [id number]  HPI  Leah [name] is a 52 year old female who presents with right hip pain x 2 day. She has experienced this pain before that improved but has not returned for the past 2 days.   Pain is rated: 7 and aching for 2 days. Described: aching and throbbing on occasion Aggravating factors: sitting for extended periods of time and getting up from a sitting position. Lying on right side Alleviating factors: heat  Treatments: Heat, ibuprofen and acetaminophen have provided moderate benefit. She avoids taking more than one dose of ibuprofen daily as she has taken this for an extended period of time.  She was seen on 03/08/17 for right hip pain and X-ray indicated very mild degenerative changes present. She was advised to use acetaminophen prn and ibuprofen 800 mg BID for discomfort. Symptoms improved. She  has a past medical history of Anxiety, Asthma, Carpal tunnel syndrome, Colon polyps, Depression, Eosinophilic esophagitis, Esophageal stricture, Herniated disc, cervical, Hyperlipidemia, Low back pain, and Migraines.   She reports climbing on ladders at work at [employer], and beyond and pain can occur after a long day at work.   Review of Systems  Constitutional: Negative for chills, fatigue and fever.  Respiratory: Negative for cough, shortness of breath and wheezing.   Cardiovascular: Negative for chest pain and palpitations.  Gastrointestinal: Negative for abdominal pain, diarrhea, nausea and vomiting.  Musculoskeletal:       Right hip pain  Skin: Negative for rash.  Neurological: Negative for dizziness, weakness and numbness.   Past Medical History:  Diagnosis Date  . Anxiety   . Asthma   . Carpal tunnel syndrome    R hand   . Colon polyps   . Depression   . Eosinophilic esophagitis    noted in [place] with GI path report 09/29/14   . Esophageal stricture    s/p dilatation  09/29/14 with schlatski ring in [place] Dr. Edwin Cap   . Herniated disc, cervical    s/p epidural injection  . Hyperlipidemia   . Low back pain   . Migraines      Social History   Socioeconomic History  . Marital status: Significant Other    Spouse name: Not on file  . Number of children: Not on file  . Years of education: Not on file  . Highest education level: Not on file  Occupational History  . Not on file  Social Needs  . Financial resource strain: Not on file  . Food insecurity:    Worry: Not on file    Inability: Not on file  . Transportation needs:    Medical: Not on file    Non-medical: Not on file  Tobacco Use  . Smoking status: Former Smoker    Packs/day: 1.00    Years: 20.00    Pack years: 20.00    Last attempt to quit: 06/02/2006    Years since quitting: 11.0  . Smokeless tobacco: Never Used  Substance and Sexual Activity  . Alcohol use: Yes    Alcohol/week: 1.2 - 1.8 oz    Types: 2 - 3 Standard drinks or equivalent per week    Comment: socially  . Drug use: No  . Sexual activity: Yes    Partners: Male  Lifestyle  . Physical activity:    Days per week: Not on file    Minutes per session:  Not on file  . Stress: Not on file  Relationships  . Social connections:    Talks on phone: Not on file    Gets together: Not on file    Attends religious service: Not on file    Active member of club or organization: Not on file    Attends meetings of clubs or organizations: Not on file    Relationship status: Not on file  . Intimate partner violence:    Fear of current or ex partner: Not on file    Emotionally abused: Not on file    Physically abused: Not on file    Forced sexual activity: Not on file  Other Topics Concern  . Not on file  Social History Narrative   Works in retail    2 kids 19 and 13 son and daughter live in [place]. As of 02/26/17    Divorced.    Former smoker 20+ years quit in 1997 then started again for 5 years and quit 10-12 years ago as of  03/08/17     Past Surgical History:  Procedure Laterality Date  . CESAREAN SECTION    . COLONOSCOPY WITH PROPOFOL N/A 03/27/2017   Procedure: COLONOSCOPY WITH PROPOFOL;  Surgeon: Jonathon Bellows, MD;  Location: [facility] ENDOSCOPY;  Service: Gastroenterology;  Laterality: N/A;  . ESOPHAGOGASTRODUODENOSCOPY    . ESOPHAGOGASTRODUODENOSCOPY (EGD) WITH PROPOFOL N/A 03/27/2017   Procedure: ESOPHAGOGASTRODUODENOSCOPY (EGD) WITH PROPOFOL WITH DILATION;  Surgeon: Jonathon Bellows, MD;  Location: [facility] ENDOSCOPY;  Service: Gastroenterology;  Laterality: N/A;  . THROAT SURGERY  2015  . UMBILICAL HERNIA REPAIR  2006    x 2   w mesh per pt    Family History  Problem Relation Age of Onset  . Breast cancer Mother   . Thyroid disease Mother   . Colon polyps Mother   . Prostate cancer Father   . Cancer - Prostate Father   . Breast cancer Maternal Grandmother     Allergies  Allergen Reactions  . Penicillins   . Sulfa Antibiotics     Current Outpatient Medications on File Prior to Visit  Medication Sig Dispense Refill  . Acetaminophen (TYLENOL GO TABS EXTRA STRENGTH PO) Take by mouth every 8 (eight) hours.    Marland Kitchen ibuprofen (ADVIL,MOTRIN) 800 MG tablet Take 1 tablet (800 mg total) by mouth every 8 (eight) hours as needed (use sparingly). 90 tablet 0  . methocarbamol (ROBAXIN) 750 MG tablet Take 1 tablet (750 mg total) by mouth every 6 (six) hours as needed for muscle spasms. 120 tablet 0  . omega-3 acid ethyl esters (LOVAZA) 1 g capsule Take 2 capsules (2 g total) by mouth 2 (two) times daily. 120 capsule 3  . omeprazole (PRILOSEC) 40 MG capsule Take 1 capsule (40 mg total) by mouth daily. 90 capsule 3   No current facility-administered medications on file prior to visit.     BP 118/72 (BP Location: Left Arm, Patient Position: Sitting, Cuff Size: Normal)   Pulse 82   Temp 98.8 F (37.1 C) (Oral)   Resp 16   Wt 145 lb 8 oz (66 kg)   SpO2 97%   BMI 25.77 kg/m        Objective:   Physical Exam    Constitutional: She is oriented to person, place, and time. She appears well-developed and well-nourished.  Eyes: Pupils are equal, round, and reactive to light. No scleral icterus.  Neck: Neck supple.  Cardiovascular: Normal rate, regular rhythm and intact distal pulses.  Pulmonary/Chest:  Effort normal and breath sounds normal. She has no wheezes. She has no rales.  Abdominal: Soft. Bowel sounds are normal. There is no tenderness.  Musculoskeletal:  Hip: ROM full; discomfort noted; pain over greater trochanter with FABER Strength: Abduction: 5/5, Adduction: 5/5 Pelvic alignment unremarkable to inspection and palpation. Ambulates without pain Greater trochanter exhibits tenderness to palpation.   Lymphadenopathy:    She has no cervical adenopathy.  Neurological: She is alert and oriented to person, place, and time.  Skin: Skin is warm and dry. No rash noted.  Psychiatric: She has a normal mood and affect. Her behavior is normal. Judgment and thought content normal.          Assessment & Plan:  1. Greater trochanteric pain syndrome Symptoms of pain to palpation over greater trochanter and +FABER make greater trochanteric pain syndrome most likely. Recent imaging for same symptom indicated degenerative changes. She reported improvement but symptom has returned again. Advised use of diclofenac for a short term trial and referral to PT for evaluation and exercises. We also discussed referral to sports medicine/ortho for evaluation as treatments of local injections may be considered if appropriate. She decided to start with PT at this time.  We discussed that she should not take ibuprofen with diclofenac and she agreed to discontinue ibuprofen while taking diclofenac. Further advised her to take this medication with food.  - diclofenac (VOLTAREN) 75 MG EC tablet; Take 1 tablet (75 mg total) by mouth 2 (two) times daily.  Dispense: 30 tablet; Refill: 0 - Ambulatory referral to Physical  Therapy  Delano Metz, FNP-C

## 2017-06-01 ENCOUNTER — Encounter: Payer: Self-pay | Admitting: Family Medicine

## 2017-06-01 ENCOUNTER — Ambulatory Visit (INDEPENDENT_AMBULATORY_CARE_PROVIDER_SITE_OTHER): Payer: BLUE CROSS/BLUE SHIELD | Admitting: Family Medicine

## 2017-06-01 VITALS — BP 118/72 | HR 82 | Temp 98.8°F | Resp 16 | Wt 145.5 lb

## 2017-06-01 DIAGNOSIS — M25559 Pain in unspecified hip: Secondary | ICD-10-CM

## 2017-06-01 MED ORDER — DICLOFENAC SODIUM 75 MG PO TBEC: 75.0000 mg | DELAYED_RELEASE_TABLET | Freq: Two times a day (BID) | ORAL | 0 refills | Status: DC

## 2017-06-01 NOTE — Patient Instructions (Signed)
Please take medication with food as directed. Do not take prescribed medication with ibuprofen as discussed.  You will be contacted about your PT referral and if you have not heard in 5 to 7 business days, please call this office.   Hip Pain The hip is the joint between the upper legs and the lower pelvis. The bones, cartilage, tendons, and muscles of your hip joint support your body and allow you to move around. Hip pain can range from a minor ache to severe pain in one or both of your hips. The pain may be felt on the inside of the hip joint near the groin, or the outside near the buttocks and upper thigh. You may also have swelling or stiffness. Follow these instructions at home: Managing pain, stiffness, and swelling  If directed, apply ice to the injured area. ? Put ice in a plastic bag. ? Place a towel between your skin and the bag. ? Leave the ice on for 20 minutes, 2-3 times a day  Sleep with a pillow between your legs on your most comfortable side.  Avoid any activities that cause pain. General instructions  Take over-the-counter and prescription medicines only as told by your health care provider.  Do any exercises as told by your health care provider.  Record the following: ? How often you have hip pain. ? The location of your pain. ? What the pain feels like. ? What makes the pain worse.  Keep all follow-up visits as told by your health care provider. This is important. Contact a health care provider if:  You cannot put weight on your leg.  Your pain or swelling continues or gets worse after one week.  It gets harder to walk.  You have a fever. Get help right away if:  You fall.  You have a sudden increase in pain and swelling in your hip.  Your hip is red or swollen or very tender to touch. Summary  Hip pain can range from a minor ache to severe pain in one or both of your hips.  The pain may be felt on the inside of the hip joint near the groin, or  the outside near the buttocks and upper thigh.  Avoid any activities that cause pain.  Record how often you have hip pain, the location of the pain, what makes it worse and what it feels like. This information is not intended to replace advice given to you by your health care provider. Make sure you discuss any questions you have with your health care provider. Document Released: 08/18/2009 Document Revised: 02/01/2016 Document Reviewed: 02/01/2016 Elsevier Interactive Patient Education  [employer].

## 2017-06-02 MED ORDER — METHOCARBAMOL 750 MG PO TABS: 750.0000 mg | ORAL_TABLET | Freq: Four times a day (QID) | ORAL | 0 refills | Status: DC | PRN

## 2017-06-02 NOTE — Telephone Encounter (Signed)
Ok to refill?Last fill on Robaxin 05/02/17 for 120 and the Ibuprofen filled 05/24/17 patient was also prescribed Voltaren 75 mg on 06/01/17 by NP Athena Masse,

## 2017-06-02 NOTE — Telephone Encounter (Signed)
Call pt She is on the voltaren, newly prescribed by Gregary Signs NP for hip pain.  Ibuprofen 800 mg and also the Voltaren are both in the same drug class, I would not take with these at the same time.  Since She was newly prescribed Voltaren, I would advise her to continue this medication.    I have refilled her Robaxin to take as needed for low back pain as prescribed by pcp in December. No alcohol or driving on this medication as can be sedating.

## 2017-06-03 ENCOUNTER — Encounter: Payer: Self-pay | Admitting: Internal Medicine

## 2017-06-05 ENCOUNTER — Ambulatory Visit: Payer: Self-pay | Admitting: *Deleted

## 2017-06-05 ENCOUNTER — Other Ambulatory Visit: Payer: Self-pay | Admitting: Internal Medicine

## 2017-06-05 DIAGNOSIS — A6000 Herpesviral infection of urogenital system, unspecified: Secondary | ICD-10-CM

## 2017-06-05 MED ORDER — VALACYCLOVIR HCL 1 G PO TABS: 1000.0000 mg | ORAL_TABLET | Freq: Every day | ORAL | 0 refills | Status: DC

## 2017-06-05 NOTE — Telephone Encounter (Signed)
Please advise 

## 2017-06-05 NOTE — Telephone Encounter (Signed)
Sent Rx   tMS

## 2017-06-05 NOTE — Telephone Encounter (Signed)
Patient is asking for medication to treat herpes outbreak. She had this before- 15 years ago. She states she was supposed to talk about it at her last appointment with PCP- but there was a lot of other things they covered that it didn't get mentioned. She states the test is in her chart.  Patient is having outbreak and is requesting treatment. She uses [facility].  Reason for Disposition . [1] Rash (e.g., redness, tiny bumps, sore) of genital area AND [2] present > 24 hours  Answer Assessment - Initial Assessment Questions 1. SYMPTOM: "What's the main symptom you're concerned about?" (e.g., rash, itching, swelling, dryness)     Patient states she has had an outbreak before- 15 years ago 2. LOCATION: "Where is the  _______ located?" (e.g., inside/outside, left/right)     outside 3. ONSET: "When did the  ________  start?"     This weekend 4. PAIN: "Is there any pain?" If so, ask: "How bad is it?" (Scale: 1-10; mild, moderate, severe)   -  MILD (1-3): doesn't interfere with normal activities    -  MODERATE (4-7): interferes with normal activities (e.g., work or school) or awakens from sleep     -  SEVERE (8-10): excruciating pain, unable to do any normal activities     Patient states she is uncomfortable 5. CAUSE: "What do you think is causing the symptoms?"     Yes- she has had this before 6. OTHER SYMPTOMS: "Do you have any other symptoms?" (e.g., fever, vaginal bleeding, pain with urination)     no 7. PREGNANCY: "Is there any chance you are pregnant?" "When was your last menstrual period?"     Not asked  Protocols used: [facility]

## 2017-06-14 ENCOUNTER — Ambulatory Visit (INDEPENDENT_AMBULATORY_CARE_PROVIDER_SITE_OTHER): Payer: 59 | Admitting: Psychology

## 2017-06-14 DIAGNOSIS — F419 Anxiety disorder, unspecified: Secondary | ICD-10-CM

## 2017-06-14 DIAGNOSIS — F329 Major depressive disorder, single episode, unspecified: Secondary | ICD-10-CM

## 2017-06-14 NOTE — Telephone Encounter (Signed)
Left message to return call 

## 2017-06-14 NOTE — Telephone Encounter (Signed)
Patient advised of below and verbalized understanding. However she states unable to tolerate ,due to stomach upset.  She is taking ibuprofen in the 1 in am and 1 in pm. Is there anything else she can take?

## 2017-06-15 ENCOUNTER — Other Ambulatory Visit: Payer: Self-pay | Admitting: Internal Medicine

## 2017-06-15 MED ORDER — IBUPROFEN 800 MG PO TABS: 800.0000 mg | ORAL_TABLET | Freq: Three times a day (TID) | ORAL | 0 refills | Status: DC | PRN

## 2017-06-15 NOTE — Telephone Encounter (Signed)
Patient was advised at her visit when diclofenac was prescribed to stop ibuprofen as these should not be taken at the same time. Referral to PT was also ordered at that time. Please call patient and reinforce that these should not be taken together and we discussed NSAID use sparingly.

## 2017-06-20 ENCOUNTER — Other Ambulatory Visit: Payer: Self-pay

## 2017-06-20 ENCOUNTER — Ambulatory Visit: Payer: 59 | Attending: Family Medicine

## 2017-06-20 DIAGNOSIS — M25551 Pain in right hip: Secondary | ICD-10-CM | POA: Diagnosis not present

## 2017-06-20 DIAGNOSIS — M6281 Muscle weakness (generalized): Secondary | ICD-10-CM | POA: Diagnosis present

## 2017-06-20 NOTE — Telephone Encounter (Signed)
Patient advised of below and states she is no longer taking medication due to stomach upset .   She was advised previously at office visit not to take NSAID. She states she is going to PT .

## 2017-06-20 NOTE — Therapy (Signed)
[facility] [address] [place], [address] Phone: [phone]   Fax:  [phone]  Physical Therapy Evaluation  Patient Details  Name: Leah [name] MRN: [id number] Date of Birth: 1965/09/09 Referring Provider: Delano Metz, FNP   Encounter Date: 06/20/2017  PT End of Session - 06/20/17 1104    Visit Number  1    Number of Visits  11    Date for PT Re-Evaluation  07/27/17    PT Start Time  1104    PT Stop Time  1216    PT Time Calculation (min)  72 min    Activity Tolerance  Patient tolerated treatment well    Behavior During Therapy  [facility] for tasks assessed/performed       Past Medical History:  Diagnosis Date  . Anxiety   . Asthma   . Carpal tunnel syndrome    R hand   . Colon polyps   . Depression   . Eosinophilic esophagitis    noted in [place] with GI path report 09/29/14   . Esophageal stricture    s/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 Edwin Cap   . Herniated disc, cervical    s/p epidural injection  . Hyperlipidemia   . Low back pain   . Migraines     Past Surgical History:  Procedure Laterality Date  . CESAREAN SECTION    . COLONOSCOPY WITH PROPOFOL N/A 03/27/2017   Procedure: COLONOSCOPY WITH PROPOFOL;  Surgeon: Jonathon Bellows, MD;  Location: [facility] ENDOSCOPY;  Service: Gastroenterology;  Laterality: N/A;  . ESOPHAGOGASTRODUODENOSCOPY    . ESOPHAGOGASTRODUODENOSCOPY (EGD) WITH PROPOFOL N/A 03/27/2017   Procedure: ESOPHAGOGASTRODUODENOSCOPY (EGD) WITH PROPOFOL WITH DILATION;  Surgeon: Jonathon Bellows, MD;  Location: [facility] ENDOSCOPY;  Service: Gastroenterology;  Laterality: N/A;  . THROAT SURGERY  2015  . UMBILICAL HERNIA REPAIR  2006    x 2   w mesh per pt    There were no vitals filed for this visit.   Subjective Assessment - 06/20/17 1109    Subjective  R hip pain (TTP R iliac crest, greater trochanter, anterior lateral portion of hip joint): 6/10  currently (pt sitting), 2/10 at best for the past month, 10/10 at worst for the past month.     Pertinent History  R hip pain since 10 years ago. Pt was doing dishes. Pt turned to the R to place dishes from the sink to the dish drainer 6 inches to her R. Pt felt a pop in her back and her whole R LE went numb.  Had not had an X-ray at the time but was diagnosed with sciatica. Had PT then which helped. Was out of work for about a month due to pain.  Was diagnosed with a hernated disc in her neck about 4 years ago around C5. Had 2-3 MRIs in [place] for her neck. Neck has not bothered her so much but affects her R hand.  Over the past year pt lost 44 lbs purposely.  Had a kidney infection and had to stop exercising.  Also lost another 20 lbs due to stress related issues.  Pt works at [employer] and has to consistently climb ladders.  Climbing up ladders does not really bother her hip but going down does.  Pt also adds that her R hip pain radiates down to her knee (L5 dermatome area)    Patient Stated Goals  Walk up and down stairs without  cringing.     Currently in Pain?  Yes    Pain Score  6     Pain Location  Hip    Pain Orientation  Right    Pain Descriptors / Indicators  Tightness;Aching    Pain Type  Chronic pain    Pain Onset  More than a month ago    Pain Frequency  Constant    Aggravating Factors   going down ladders, walking up and down stairs, standing up after sitting, long distance driving (such as to see her kids in [place]), cold, sleeping on either R or L side.     Pain Relieving Factors  heat, ibuprofen, tylenol, sometimes seated R piriformis stretch. Supine with R leg straight, L leg bent.          [facility] PT Assessment - 06/20/17 1125      Assessment   Medical Diagnosis  Greater trochanteric pain syndrome     Referring Provider  Delano Metz, FNP    Onset Date/Surgical Date  06/01/17 date PT referral signed; pain began 10 years ago per pt    Prior Therapy  Had  PT for sciatica about 10 years ago which helped.       Precautions   Precaution Comments  No known precautions.      Restrictions   Other Position/Activity Restrictions  No known restrictions      Prior Function   Leisure  PLOF: less difficulty going up and down stairs and ladders, sleeping on her side, sitting in long car rides      Observation/Other Assessments   Observations  No change with R lateral shift correction. Slight different sensation with L lateral shift correction but no increase in pain.  No R hip pain/ache after repeated flexion test.  (+) Ober's test R hip with both knee straight and bent.  Long sit test suggests anterior nutation R innominate.  Reproductoin of pain with R hip flexion, IR, and add as well as with FABER test     Focus on Therapeutic Outcomes (FOTO)   50 (hip)    Lower Extremity Functional Scale   55/80      Posture/Postural Control   Posture Comments  Bilaterally protracted shoulders and neck, L shoulder lower, decreased lordosis, slight R lateral shift and L trunk side bend.       AROM   Overall AROM Comments  prone on elbows for back extension: R low back pain.     Lumbar Flexion  WFL, no hip pain No R hip pain/symptoms after repeated flexion test    Lumbar Extension  limited    Lumbar - Right Side Bend  limited with reproduction of R hip and lateral thigh pain    Lumbar - Left Side Bend  WFL, no change in pain    Lumbar - Right Rotation  WFL with thoracic and lumbar spine pain Sitting    Lumbar - Left Rotation  WFL Sitting      PROM   Overall PROM Comments  supine R hip IR at 90/90: 17 degrees R with pain, 27 degrees L       Strength   Right Hip Flexion  4-/5    Right Hip Extension  4-/5 with pain    Right Hip ABduction  4-/5 with pain    Left Hip Flexion  4/5    Left Hip Extension  4+/5    Left Hip ABduction  4/5    Right Knee Flexion  4+/5 with R hip  pain    Right Knee Extension  5/5    Left Knee Flexion  5/5    Left Knee Extension  5/5       Palpation   Palpation comment  slight muscle atrophy R glute med/min palpated in standing. TTP R iliac crest, greater trochanter, anterior lateral portion of hip joint      Ambulation/Gait   Gait Comments  L trunk lean during L LE stance phase > R trunk lean during R LE stance phase.                 Objective measurements completed on examination: See above findings.   Pt states having and X-ray for her R hip and a CT scan for R hip. Thinks she was told that she has arthritis in her R hip.    Therapeutic exercise  Sitting with lumbar towel roll. Increased R hip pain and R proximal lateral thigh symptoms.    5/10 R hip ache towards end of session seated R hip extension isometrics. Increased pain.   Seated L hip flexion isometrics 10x5 seconds for 3 sets  No change in R hip ache.   Seated clamshell isometrics 1 min 40% effort, 1 min rest 2x  Reviewed HEP. Pt demonstrated and verbalized understanding.     Improved exercise technique, movement at target joints, use of target muscles after mod verbal, visual, tactile cues.    Patient is a 52 year old female who came to physical therapy secondary to chronic R hip pain. She also presents with altered gait pattern and posture, positive special tests suggesting lumbopelvic and hip involvement, L hip weakness, TTP and difficulty performing functional tasks such as stair negotiation and climbing ladders at work. Patient will benefit from skilled physical therapy services to address the aforementioned deficits.             PT Education - 06/20/17 1211    Education provided  Yes    Education Details  ther-ex, HEP, plan of care    Person(s) Educated  Patient    Methods  Explanation;Demonstration;Tactile cues;Verbal cues;Handout    Comprehension  Returned demonstration;Verbalized understanding          PT Long Term Goals - 06/20/17 1241      PT LONG TERM GOAL #1   Title  Patient will have a decrease in R hip  pain to 5/10 at worst to promote ability to climb ladders, negotiate stairs, and sleep on her side.     Baseline  10/10 R hip pain at most for for the past month (06/20/2017)    Time  5    Period  Weeks    Status  New    Target Date  07/27/17      PT LONG TERM GOAL #2   Title  Patient will improve her FOTO (hip) score by at least 10 points as a demonstration of improved function.     Baseline  50 (FOTO) 06/20/2017    Time  5    Period  Weeks    Status  New    Target Date  07/27/17      PT LONG TERM GOAL #3   Title  Patient will improve R hip strength by at least 1/2 MMT grade to promote ability to negotiate stairs, climb ladders.     Time  5    Period  Weeks    Status  New    Target Date  07/27/17      PT LONG  TERM GOAL #4   Title  Pt will report decreased difficulty climbing stairs and ladders to promote ability to perform work tasks.     Baseline  Pt states increased difficulty with stair negotiation and ladder climbing due to pain (06/20/2017)    Time  5    Period  Weeks    Status  New    Target Date  07/27/17      PT LONG TERM GOAL #5   Title  Patient will improve her LEFS by at least 9 points as a demonstration of improved function.     Baseline  55/80 (06/20/2017)    Time  5    Period  Weeks    Status  New    Target Date  07/27/17             Plan - 06/20/17 1231    Clinical Impression Statement  Patient is a 52 year old female who came to physical therapy secondary to chronic R hip pain. She also presents with altered gait pattern and posture, positive special tests suggesting lumbopelvic and hip involvement, L hip weakness, TTP and difficulty performing functional tasks such as stair negotiation and climbing ladders at work. Patient will benefit from skilled physical therapy services to address the aforementioned deficits.     History and Personal Factors relevant to plan of care:  Chronicity of condition, hip weakness, difficulty climbing ladders, stairs, tolerating  prolonged sitting.  TTP    Clinical Presentation  Stable    Clinical Decision Making  Low    Rehab Potential  Fair    Clinical Impairments Affecting Rehab Potential  Chronicity of condition, pain, TTP, hip weakness    PT Frequency  2x / week    PT Duration  Other (comment) 5 weeks    PT Treatment/Interventions  Manual techniques;Therapeutic exercise;Therapeutic activities;Dry needling;Aquatic Therapy;Electrical Stimulation;Iontophoresis 4mg /ml Dexamethasone;Traction;Ultrasound;Gait training;Neuromuscular re-education;Patient/family education    PT Next Visit Plan  hip strengthening, lumbopelvic femoral control, trunk strengthening, manual techniques and modalities PRN    Consulted and Agree with Plan of Care  Patient       Patient will benefit from skilled therapeutic intervention in order to improve the following deficits and impairments:  Pain, Postural dysfunction, Improper body mechanics, Decreased strength, Difficulty walking  Visit Diagnosis: Pain in right hip - Plan: PT plan of care cert/re-cert  Muscle weakness (generalized) - Plan: PT plan of care cert/re-cert     Problem List Patient Active Problem List   Diagnosis Date Noted  . Hypertriglyceridemia 04/04/2017  . Breast cyst, left 04/04/2017  . Aortic atherosclerosis ([place]) 04/04/2017  . Anxiety and depression 03/08/2017  . Dysphagia 03/08/2017  . Hip pain-right 03/08/2017  . Low back pain 03/08/2017  . Vaginal discharge 03/08/2017  . Flank pain 10/18/2016  . Asthma 06/01/2016   Joneen Boers PT, DPT   06/20/2017, 7:55 PM  [facility] [address], [place], [address] Phone: [phone]   Fax:  [phone]  Name: Leah [name] MRN: [id number] Date of Birth: [birth year]

## 2017-06-20 NOTE — Patient Instructions (Signed)
   Sitting on your bed (hips less than 90 degrees flexion)   Belt around thighs, which are shoulder width apart,    Press knees out against belt with 40 % effort for 1 minute   Rest for 1-2 minutes   Perform 5 times per set.     Do 3 sets per day. Each set to be performed a few hours apart.

## 2017-06-26 ENCOUNTER — Ambulatory Visit: Payer: 59

## 2017-06-26 DIAGNOSIS — M25551 Pain in right hip: Secondary | ICD-10-CM | POA: Diagnosis not present

## 2017-06-26 DIAGNOSIS — M6281 Muscle weakness (generalized): Secondary | ICD-10-CM

## 2017-06-26 NOTE — Patient Instructions (Addendum)
  Sitting on a chair    Press your hands on your thighs to feel your abdominal muscles contract.   Hold for 5 seconds comfortably.   Repeat 10 times.   Perform 3 sets daily.     Sitting on a chair   Press your right foot against the the chair leg 40% effort   Perform for 1 minute with 1-2 minute rest breaks in between.    Repeat 5 times,    Perform 3 sets daily.         Med Bridge Access Code: [email or web address]   Single Leg Stance with Support 10x5 second daily.

## 2017-06-26 NOTE — Therapy (Signed)
[facility] [address], [place], [address] Phone: [phone]   Fax:  [phone]  Physical Therapy Treatment  Patient Details  Name: Leah [name] MRN: [id number] Date of Birth: 1965-10-26 Referring Provider: Delano Metz, FNP   Encounter Date: 06/26/2017  PT End of Session - 06/26/17 1700    Visit Number  2    Number of Visits  11    Date for PT Re-Evaluation  07/27/17    PT Start Time  1701    PT Stop Time  2426    PT Time Calculation (min)  48 min    Activity Tolerance  Patient tolerated treatment well    Behavior During Therapy  [facility] for tasks assessed/performed       Past Medical History:  Diagnosis Date  . Anxiety   . Asthma   . Carpal tunnel syndrome    R hand   . Colon polyps   . Depression   . Eosinophilic esophagitis    noted in [place] with GI path report 09/29/14   . Esophageal stricture    s/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 Edwin Cap   . Herniated disc, cervical    s/p epidural injection  . Hyperlipidemia   . Low back pain   . Migraines     Past Surgical History:  Procedure Laterality Date  . CESAREAN SECTION    . COLONOSCOPY WITH PROPOFOL N/A 03/27/2017   Procedure: COLONOSCOPY WITH PROPOFOL;  Surgeon: Jonathon Bellows, MD;  Location: [facility] ENDOSCOPY;  Service: Gastroenterology;  Laterality: N/A;  . ESOPHAGOGASTRODUODENOSCOPY    . ESOPHAGOGASTRODUODENOSCOPY (EGD) WITH PROPOFOL N/A 03/27/2017   Procedure: ESOPHAGOGASTRODUODENOSCOPY (EGD) WITH PROPOFOL WITH DILATION;  Surgeon: Jonathon Bellows, MD;  Location: [facility] ENDOSCOPY;  Service: Gastroenterology;  Laterality: N/A;  . THROAT SURGERY  2015  . UMBILICAL HERNIA REPAIR  2006    x 2   w mesh per pt    There were no vitals filed for this visit.  Subjective Assessment - 06/26/17 1701    Subjective  R hip is not bad. No pain currently. Did her HEP a few times when she remembered. Seemed ok. Stairs still  hurt, climbing down ladders still bothers her.     Pertinent History  R hip pain since 10 years ago. Pt was doing dishes. Pt turned to the R to place dishes from the sink to the dish drainer 6 inches to her R. Pt felt a pop in her back and her whole R LE went numb.  Had not had an X-ray at the time but was diagnosed with sciatica. Had PT then which helped. Was out of work for about a month due to pain.  Was diagnosed with a hernated disc in her neck about 4 years ago around C5. Had 2-3 MRIs in [place] for her neck. Neck has not bothered her so much but affects her R hand.  Over the past year pt lost 44 lbs purposely.  Had a kidney infection and had to stop exercising.  Also lost another 20 lbs due to stress related issues.  Pt works at [employer] and has to consistently climb ladders.  Climbing up ladders does not really bother her hip but going down does.  Pt also adds that her R hip pain radiates down to her knee (L5 dermatome area)    Patient Stated Goals  Walk up and down stairs without cringing.     Currently  in Pain?  No/denies    Pain Score  0-No pain    Pain Onset  More than a month ago                               PT Education - 06/26/17 1704    Education provided  Yes    Education Details  ther-ex, HEP    Person(s) Educated  Patient    Methods  Explanation;Demonstration;Tactile cues;Verbal cues;Handout    Comprehension  Returned demonstration;Verbalized understanding        Objectives  Med Bridge Access Code: [email or web address] No latex band allergies  Pt states she did not take ibuprofen and muscle relaxer this morning. Did not have time. Going to take them later today   Therapeutic exercise   Seated bilateral shoulder flexion isometrics 10x3 with 5 second holds to promote trunk muscle strengthening  Standing R LE leg press resisting blue band 10x3 with bilateral UE assist to promote glute muscle strengthening  Side stepping 32 ft to the R and  32 ft to the L with hip symptoms no greater than 3/10 ache level  Forward wedding march 32 ft x 2  SLS on R LE with emphasis on pelvic control 10x 5 seconds for 2 sets to also promote isometric glute med muscle strengthening. Slight R anterior lateral hip ache after second set which eases with sitting.  Reviewed and given as part of her HEP (one set instead of 2). Handout provided. Pt demonstrated and verbalized understanding.    Standing low rows resisting green band 10x5 seconds for 2 sets  Standing mini walking lunges, emphasis on femoral control 32 ft x 2  Seated R hip IR isometrics 1 min a 40% effort with 1 min rest breaks for 3 sets  Reviewed and given as part of her HEP. Pt demonstrated and verbalized understanding.    Improved exercise technique, movement at target joints, use of target muscles after mod verbal, visual, tactile cues.    Continued working on isometric and concentric R glute med and max strengthening as well as trunk muscle strengthening to promote lumbopelvic control and promote comfortable stress the R lateral hip area. Pt tolerated session well without aggravation of symptoms.        PT Long Term Goals - 06/20/17 1241      PT LONG TERM GOAL #1   Title  Patient will have a decrease in R hip pain to 5/10 at worst to promote ability to climb ladders, negotiate stairs, and sleep on her side.     Baseline  10/10 R hip pain at most for for the past month (06/20/2017)    Time  5    Period  Weeks    Status  New    Target Date  07/27/17      PT LONG TERM GOAL #2   Title  Patient will improve her FOTO (hip) score by at least 10 points as a demonstration of improved function.     Baseline  50 (FOTO) 06/20/2017    Time  5    Period  Weeks    Status  New    Target Date  07/27/17      PT LONG TERM GOAL #3   Title  Patient will improve R hip strength by at least 1/2 MMT grade to promote ability to negotiate stairs, climb ladders.     Time  5    Period  Weeks  Status  New    Target Date  07/27/17      PT LONG TERM GOAL #4   Title  Pt will report decreased difficulty climbing stairs and ladders to promote ability to perform work tasks.     Baseline  Pt states increased difficulty with stair negotiation and ladder climbing due to pain (06/20/2017)    Time  5    Period  Weeks    Status  New    Target Date  07/27/17      PT LONG TERM GOAL #5   Title  Patient will improve her LEFS by at least 9 points as a demonstration of improved function.     Baseline  55/80 (06/20/2017)    Time  5    Period  Weeks    Status  New    Target Date  07/27/17            Plan - 06/26/17 1656    Clinical Impression Statement  Continued working on isometric and concentric R glute med and max strengthening as well as trunk muscle strengthening to promote lumbopelvic control and promote comfortable stress the R lateral hip area. Pt tolerated session well without aggravation of symptoms.     Rehab Potential  Fair    Clinical Impairments Affecting Rehab Potential  Chronicity of condition, pain, TTP, hip weakness    PT Frequency  2x / week    PT Duration  Other (comment) 5 weeks    PT Treatment/Interventions  Manual techniques;Therapeutic exercise;Therapeutic activities;Dry needling;Aquatic Therapy;Electrical Stimulation;Iontophoresis 4mg /ml Dexamethasone;Traction;Ultrasound;Gait training;Neuromuscular re-education;Patient/family education    PT Next Visit Plan  hip strengthening, lumbopelvic femoral control, trunk strengthening, manual techniques and modalities PRN    Consulted and Agree with Plan of Care  Patient       Patient will benefit from skilled therapeutic intervention in order to improve the following deficits and impairments:  Pain, Postural dysfunction, Improper body mechanics, Decreased strength, Difficulty walking  Visit Diagnosis: Pain in right hip  Muscle weakness (generalized)     Problem List Patient Active Problem List   Diagnosis Date  Noted  . Hypertriglyceridemia 04/04/2017  . Breast cyst, left 04/04/2017  . Aortic atherosclerosis ([place]) 04/04/2017  . Anxiety and depression 03/08/2017  . Dysphagia 03/08/2017  . Hip pain-right 03/08/2017  . Low back pain 03/08/2017  . Vaginal discharge 03/08/2017  . Flank pain 10/18/2016  . Asthma 06/01/2016   Joneen Boers PT, DPT   06/26/2017, 6:00 PM  [facility] [address] [place], [address] Phone: [phone]   Fax:  [phone]  Name: Leah [name] MRN: [id number] Date of Birth: 04/15/1965

## 2017-06-28 ENCOUNTER — Ambulatory Visit: Payer: 59

## 2017-06-29 ENCOUNTER — Other Ambulatory Visit: Payer: Self-pay | Admitting: Internal Medicine

## 2017-06-29 ENCOUNTER — Other Ambulatory Visit: Payer: Self-pay | Admitting: Family

## 2017-06-29 DIAGNOSIS — M62838 Other muscle spasm: Secondary | ICD-10-CM

## 2017-06-29 NOTE — Telephone Encounter (Signed)
Last filled 06/02/17 Last office visit 06/01/17 by Lenord Carbo, NP

## 2017-07-03 ENCOUNTER — Other Ambulatory Visit: Payer: Self-pay | Admitting: Internal Medicine

## 2017-07-03 DIAGNOSIS — A6000 Herpesviral infection of urogenital system, unspecified: Secondary | ICD-10-CM

## 2017-07-03 MED ORDER — VALACYCLOVIR HCL 1 G PO TABS
1000.0000 mg | ORAL_TABLET | Freq: Every day | ORAL | 11 refills | Status: DC
Start: 2017-07-03 — End: 2018-09-26

## 2017-07-10 ENCOUNTER — Ambulatory Visit: Payer: 59

## 2017-07-10 DIAGNOSIS — M25551 Pain in right hip: Secondary | ICD-10-CM | POA: Diagnosis not present

## 2017-07-10 DIAGNOSIS — M6281 Muscle weakness (generalized): Secondary | ICD-10-CM

## 2017-07-10 NOTE — Therapy (Signed)
[facility] [address] [place], [address] Phone: [phone]   Fax:  [phone]  Physical Therapy Treatment  Patient Details  Name: Leah [name] MRN: [id number] Date of Birth: 29-Feb-1966 Referring Provider: Delano Metz, FNP   Encounter Date: 07/10/2017  PT End of Session - 07/10/17 1529    Visit Number  3    Number of Visits  11    Date for PT Re-Evaluation  07/27/17    PT Start Time  2409    PT Stop Time  1614    PT Time Calculation (min)  44 min    Activity Tolerance  Patient tolerated treatment well    Behavior During Therapy  [facility] for tasks assessed/performed       Past Medical History:  Diagnosis Date  . Anxiety   . Asthma   . Carpal tunnel syndrome    R hand   . Colon polyps   . Depression   . Eosinophilic esophagitis    noted in [place] with GI path report 09/29/14   . Esophageal stricture    s/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 Edwin Cap   . Herniated disc, cervical    s/p epidural injection  . Hyperlipidemia   . Low back pain   . Migraines     Past Surgical History:  Procedure Laterality Date  . CESAREAN SECTION    . COLONOSCOPY WITH PROPOFOL N/A 03/27/2017   Procedure: COLONOSCOPY WITH PROPOFOL;  Surgeon: Jonathon Bellows, MD;  Location: [facility] ENDOSCOPY;  Service: Gastroenterology;  Laterality: N/A;  . ESOPHAGOGASTRODUODENOSCOPY    . ESOPHAGOGASTRODUODENOSCOPY (EGD) WITH PROPOFOL N/A 03/27/2017   Procedure: ESOPHAGOGASTRODUODENOSCOPY (EGD) WITH PROPOFOL WITH DILATION;  Surgeon: Jonathon Bellows, MD;  Location: [facility] ENDOSCOPY;  Service: Gastroenterology;  Laterality: N/A;  . THROAT SURGERY  2015  . UMBILICAL HERNIA REPAIR  2006    x 2   w mesh per pt    There were no vitals filed for this visit.  Subjective Assessment - 07/10/17 1532    Subjective  R hip was pretty good for the past few days. Has not taken too much pain meds. No pain currently. 5/10 R  hip pain at most for the past 7 days. Did the SLS exercises.  Some days the stairs and ladder feel better, other days not, depends on activity during the day.  Pt states not doing her hip isometrics HEP.     Pertinent History  R hip pain since 10 years ago. Pt was doing dishes. Pt turned to the R to place dishes from the sink to the dish drainer 6 inches to her R. Pt felt a pop in her back and her whole R LE went numb.  Had not had an X-ray at the time but was diagnosed with sciatica. Had PT then which helped. Was out of work for about a month due to pain.  Was diagnosed with a hernated disc in her neck about 4 years ago around C5. Had 2-3 MRIs in [place] for her neck. Neck has not bothered her so much but affects her R hand.  Over the past year pt lost 44 lbs purposely.  Had a kidney infection and had to stop exercising.  Also lost another 20 lbs due to stress related issues.  Pt works at [employer] and has to consistently climb ladders.  Climbing up ladders does not really bother her hip but going down does.  Pt  also adds that her R hip pain radiates down to her knee (L5 dermatome area)    Patient Stated Goals  Walk up and down stairs without cringing.     Currently in Pain?  No/denies    Pain Score  0-No pain    Pain Onset  More than a month ago                               PT Education - 07/10/17 1541    Education provided  Yes    Education Details  ther-ex    [employer]) Educated  Patient    Methods  Explanation;Demonstration;Tactile cues;Verbal cues    Comprehension  Returned demonstration;Verbalized understanding         Objectives  Med Bridge Access Code: [email or web address] No latex band allergies   Therapeutic exercise   Standing R LE leg press resisting blue band 10x3 with bilateral UE assist to promote glute muscle strengthening  Side stepping 32 ft to the R and 32 ft to the L with hip symptoms no greater than 3/10 ache level  SLS with L tip  toe assist with with 2 kg ball toss about 20 throws. Increased R lateral hip ache  Seated R hip IR isometrics 1 min a 40% effort with 1 min rest breaks for 5 sets.  Decreased R hip ache.   Forward wedding march 32 ft x 2 to promote glute strengthening  Ascending and descending 4 regular steps with bilateral UE assist 1x  Bilateral genu valgus with R hip symptoms  Forward step up onto regular step with bilateral UE assist, emphasis on femoral control 5x3. No hip pain.   Then ascending and descending 4 regular steps again with bilateral UE assist, emphasis on femoral control 3x. No hip pain.    Standing low rows resisting green band 10x5 seconds for 2 sets   Improved exercise technique, movement at target joints, use of target muscles after min to mod verbal, visual, tactile cues.   Able to perform stair negotiation in a reciprocal pattern without R lateral hip pain with femoral control.  Continued working on glute med and max activation to promote proper tension to greater trochanter area as well as to promote lumbopelvic control to help decrease R later hip pain.        PT Long Term Goals - 06/20/17 1241      PT LONG TERM GOAL #1   Title  Patient will have a decrease in R hip pain to 5/10 at worst to promote ability to climb ladders, negotiate stairs, and sleep on her side.     Baseline  10/10 R hip pain at most for for the past month (06/20/2017)    Time  5    Period  Weeks    Status  New    Target Date  07/27/17      PT LONG TERM GOAL #2   Title  Patient will improve her FOTO (hip) score by at least 10 points as a demonstration of improved function.     Baseline  50 (FOTO) 06/20/2017    Time  5    Period  Weeks    Status  New    Target Date  07/27/17      PT LONG TERM GOAL #3   Title  Patient will improve R hip strength by at least 1/2 MMT grade to promote ability to negotiate stairs, climb ladders.  Time  5    Period  Weeks    Status  New    Target Date   07/27/17      PT LONG TERM GOAL #4   Title  Pt will report decreased difficulty climbing stairs and ladders to promote ability to perform work tasks.     Baseline  Pt states increased difficulty with stair negotiation and ladder climbing due to pain (06/20/2017)    Time  5    Period  Weeks    Status  New    Target Date  07/27/17      PT LONG TERM GOAL #5   Title  Patient will improve her LEFS by at least 9 points as a demonstration of improved function.     Baseline  55/80 (06/20/2017)    Time  5    Period  Weeks    Status  New    Target Date  07/27/17            Plan - 07/10/17 1529    Clinical Impression Statement  Able to perform stair negotiation in a reciprocal pattern without R lateral hip pain with femoral control.  Continued working on glute med and max activation to promote proper tension to greater trochanter area as well as to promote lumbopelvic control to help decrease R later hip pain.    Rehab Potential  Fair    Clinical Impairments Affecting Rehab Potential  Chronicity of condition, pain, TTP, hip weakness    PT Frequency  2x / week    PT Duration  Other (comment) 5 weeks    PT Treatment/Interventions  Manual techniques;Therapeutic exercise;Therapeutic activities;Dry needling;Aquatic Therapy;Electrical Stimulation;Iontophoresis 4mg /ml Dexamethasone;Traction;Ultrasound;Gait training;Neuromuscular re-education;Patient/family education    PT Next Visit Plan  hip strengthening, lumbopelvic femoral control, trunk strengthening, manual techniques and modalities PRN    Consulted and Agree with Plan of Care  Patient       Patient will benefit from skilled therapeutic intervention in order to improve the following deficits and impairments:  Pain, Postural dysfunction, Improper body mechanics, Decreased strength, Difficulty walking  Visit Diagnosis: Pain in right hip  Muscle weakness (generalized)     Problem List Patient Active Problem List   Diagnosis Date Noted   . Hypertriglyceridemia 04/04/2017  . Breast cyst, left 04/04/2017  . Aortic atherosclerosis ([place]) 04/04/2017  . Anxiety and depression 03/08/2017  . Dysphagia 03/08/2017  . Hip pain-right 03/08/2017  . Low back pain 03/08/2017  . Vaginal discharge 03/08/2017  . Flank pain 10/18/2016  . Asthma 06/01/2016    Joneen Boers PT, DPT    07/10/2017, 7:10 PM  [facility] [address], [place], [address] Phone: [phone]   Fax:  [phone]  Name: Teauna Dubach MRN: [id number] Date of Birth: 1965/04/08

## 2017-07-11 ENCOUNTER — Encounter: Payer: Self-pay | Admitting: Family Medicine

## 2017-07-11 ENCOUNTER — Ambulatory Visit (INDEPENDENT_AMBULATORY_CARE_PROVIDER_SITE_OTHER): Payer: 59 | Admitting: Family Medicine

## 2017-07-11 VITALS — BP 160/82 | HR 68 | Temp 98.3°F | Wt 145.8 lb

## 2017-07-11 DIAGNOSIS — R350 Frequency of micturition: Secondary | ICD-10-CM | POA: Diagnosis not present

## 2017-07-11 LAB — POC URINALSYSI DIPSTICK (AUTOMATED)
Bilirubin, UA: NEGATIVE
Glucose, UA: NEGATIVE
Ketones, UA: NEGATIVE
Leukocytes, UA: NEGATIVE
Nitrite, UA: NEGATIVE
PH UA: 6 (ref 5.0–8.0)
PROTEIN UA: NEGATIVE
RBC UA: NEGATIVE
SPEC GRAV UA: 1.015 (ref 1.010–1.025)
UROBILINOGEN UA: NEGATIVE U/dL — AB

## 2017-07-11 NOTE — Patient Instructions (Signed)
Urine sample will be cultured for growth of bacteria and we will contact you with results.  Please consider use of cranberry tablets instead of increasing juice intake.  If symptoms do not improve, please return to lab for urine cytology test as discussed.   If you develop symptoms of fever >101, pain in your back, or worsening symptoms please follow up for further evaluation and treatment.   Urinary Frequency, Adult Urinary frequency means urinating more often than usual. People with urinary frequency urinate at least 8 times in 24 hours, even if they drink a normal amount of fluid. Although they urinate more often than normal, the total amount of urine produced in a day may be normal. Urinary frequency is also called pollakiuria. What are the causes? This condition may be caused by:  A urinary tract infection.  Obesity.  Bladder problems, such as bladder stones.  Caffeine or alcohol.  Eating food or drinking fluids that irritate the bladder. These include coffee, tea, soda, artificial sweeteners, citrus, tomato-based foods, and chocolate.  Certain medicines, such as medicines that help the body get rid of extra fluid (diuretics).  Muscle or nerve weakness.  Overactive bladder.  Chronic diabetes.  Interstitial cystitis.  In men, problems with the prostate, such as an enlarged prostate.  In women, pregnancy.  In some cases, the cause may not be known. What increases the risk? This condition is more likely to develop in:  Women who have gone through menopause.  Men with prostate problems.  People with a disease or injury that affects the nerves or spinal cord.  People who have or have had a condition that affects the brain, such as a stroke.  What are the signs or symptoms? Symptoms of this condition include:  Feeling an urgent need to urinate often. The stress and anxiety of needing to find a bathroom quickly can make this urge worse.  Urinating 8 or more times  in 24 hours.  Urinating as often as every 1 to 2 hours.  How is this diagnosed? This condition is diagnosed based on your symptoms, your medical history, and a physical exam. You may have tests, such as:  Blood tests.  Urine tests.  Imaging tests, such as X-rays or ultrasounds.  A bladder test.  A test of your neurological system. This is the body system that senses the need to urinate.  A test to check for problems in the urethra and bladder called cystoscopy.  You may also be asked to keep a bladder diary. A bladder diary is a record of what you eat and drink, how often you urinate, and how much you urinate. You may need to see a health care provider who specializes in conditions of the urinary tract (urologist) or kidneys (nephrologist). How is this treated? Treatment for this condition depends on the cause. Sometimes the condition goes away on its own and treatment is not necessary. If treatment is needed, it may include:  Taking medicine.  Learning exercises that strengthen the muscles that help control urination.  Following a bladder training program. This may include: ? Learning to delay going to the bathroom. ? Double urinating (voiding). This helps if you are not completely emptying your bladder. ? Scheduled voiding.  Making diet changes, such as: ? Avoiding caffeine. ? Drinking fewer fluids, especially alcohol. ? Not drinking in the evening. ? Not having foods or drinks that may irritate the bladder. ? Eating foods that help prevent or ease constipation. Constipation can make this condition worse.  Having the nerves in your bladder stimulated. There are two options for stimulating the nerves to your bladder: ? Outpatient electrical nerve stimulation. This is done by your health care provider. ? Surgery to implant a bladder pacemaker. The pacemaker helps to control the urge to urinate.  Follow these instructions at home:  Keep a bladder diary if told to by your  health care provider.  Take over-the-counter and prescription medicines only as told by your health care provider.  Do any exercises as told by your health care provider.  Follow a bladder training program as told by your health care provider.  Make any recommended diet changes.  Keep all follow-up visits as told by your health care provider. This is important. Contact a health care provider if:  You start urinating more often.  You feel pain or irritation when you urinate.  You notice blood in your urine.  Your urine looks cloudy.  You develop a fever.  You begin vomiting. Get help right away if:  You are unable to urinate. This information is not intended to replace advice given to you by your health care provider. Make sure you discuss any questions you have with your health care provider. Document Released: 12/25/2008 Document Revised: 04/01/2015 Document Reviewed: 09/24/2014 Elsevier Interactive Patient Education  [employer].

## 2017-07-11 NOTE — Progress Notes (Signed)
Patient ID: Leah [name], female   DOB: 03/06/1966, 52 y.o.   MRN: [id number]    PCP: McLean-Scocuzza, Nino Glow, MD  Subjective:  Leah [name] is a 52 y.o. year old very pleasant female patient who presents with Urinary Tract symptoms: symptoms including frequency, dysuria, and urgency. -started: 2 days ago, symptoms are improving Treatment: Cranberry juice has provided benefit for frequency and dysuria. -She reports drinking sparkling water approximately 2 liters/day and coffee in the morning.  CT 03/29/17 noted nonobstructing 2 mm stone in left kidney; no ureteral calculus on either side.  ROS-denies fever, chills, sweats, N/V, flank pain, or blood in urine  Pertinent Past Medical History- pyelonephritis in 10/18/16, she was treated with Cipro then symptoms worsened, she was then treated with IV rocephin in ED followed by keflex orally which provided benefit. Nonobstructing 56mm stone in left kidney on CT 03/29/17.  Medications- reviewed  Current Outpatient Medications  Medication Sig Dispense Refill  . Acetaminophen (TYLENOL GO TABS EXTRA STRENGTH PO) Take by mouth every 8 (eight) hours.    . diclofenac (VOLTAREN) 75 MG EC tablet Take 1 tablet (75 mg total) by mouth 2 (two) times daily. 30 tablet 0  . ibuprofen (ADVIL,MOTRIN) 800 MG tablet Take 1 tablet (800 mg total) by mouth every 8 (eight) hours as needed (use sparingly). 90 tablet 0  . methocarbamol (ROBAXIN) 750 MG tablet TAKE 1 TABLET (750 MG TOTAL) BY MOUTH EVERY 6 (SIX) HOURS AS NEEDED FOR MUSCLE SPASMS. 120 tablet 1  . omega-3 acid ethyl esters (LOVAZA) 1 g capsule Take 2 capsules (2 g total) by mouth 2 (two) times daily. 120 capsule 3  . omeprazole (PRILOSEC) 40 MG capsule Take 1 capsule (40 mg total) by mouth daily. 90 capsule 3  . valACYclovir (VALTREX) 1000 MG tablet Take 1 tablet (1,000 mg total) by mouth daily. X 5 days with outbreak as needed 30 tablet 11   No current facility-administered medications for this  visit.     Objective: BP (!) 160/82 (BP Location: Left Arm, Patient Position: Sitting, Cuff Size: Normal)   Pulse 68   Temp 98.3 F (36.8 C) (Oral)   Wt 145 lb 12 oz (66.1 kg)   SpO2 98%   BMI 25.82 kg/m   Retake of BP 140/82 Gen: NAD, resting comfortably HEENT: oropharynx is clear and moist CV: RRR no murmurs rubs or gallops Lungs: CTAB no crackles, wheeze, rhonchi Abdomen: soft/nontender/nondistended/normal bowel sounds. No rebound or guarding.  No CVA tenderness.   Suprapubic tenderness not present Ext: no edema Skin: warm, dry, no rash Neuro: grossly normal, moves all extremities  Assessment/Plan: 1. Urinary frequency Symptom has improved; UA is unremarkable; will send for culture and advised her to increase water intake, decrease cranberry juice intake and consider cranberry tablets instead of increasing juice intake. History of bacterial vaginosis noted 03/08/17; urine cytology cannot be obtained until 2 hours after clean catch was taken so she will return to clinic for this. Order placed. History of nonobstructing stone in left kidney however exam and UA are unremarkable today.  Close return precautions provided with history of stone noted above and pyelonephritis. Again, exam and UA are reassuring today with symptom improvement.  Finally, we discussed that if symptoms do not improve with conservative treatment of avoidance of caffeine, increasing water intake, and if culture is negative, she will follow up with PCP.  - POCT Urinalysis Dipstick (Automated) - Urine cytology ancillary only; Future - Urine Culture Finally, we reviewed reasons to return  to care including if symptoms worsen or persist or new concerns arise- once again particularly fever, N/V, or flank pain.    Laurita Quint, FNP

## 2017-07-12 ENCOUNTER — Ambulatory Visit (INDEPENDENT_AMBULATORY_CARE_PROVIDER_SITE_OTHER): Payer: 59 | Admitting: Psychology

## 2017-07-12 DIAGNOSIS — F419 Anxiety disorder, unspecified: Secondary | ICD-10-CM

## 2017-07-12 DIAGNOSIS — F329 Major depressive disorder, single episode, unspecified: Secondary | ICD-10-CM | POA: Diagnosis not present

## 2017-07-12 LAB — URINE CULTURE
MICRO NUMBER:: 90524946
Result:: NO GROWTH
SPECIMEN QUALITY:: ADEQUATE

## 2017-07-13 ENCOUNTER — Ambulatory Visit: Payer: 59 | Attending: Family Medicine

## 2017-07-13 DIAGNOSIS — M6281 Muscle weakness (generalized): Secondary | ICD-10-CM | POA: Diagnosis present

## 2017-07-13 DIAGNOSIS — M25551 Pain in right hip: Secondary | ICD-10-CM | POA: Insufficient documentation

## 2017-07-13 NOTE — Therapy (Signed)
[place] [facility] [address] [place], [address] Phone: [phone]   Fax:  [phone]  Physical Therapy Treatment  Patient Details  Name: Leah [name] MRN: [id number] Date of Birth: [birth year] Referring Provider: Delano Metz, FNP   Encounter Date: 07/13/2017  PT End of Session - 07/13/17 1120    Visit Number  4    Number of Visits  11    Date for PT Re-Evaluation  07/27/17    PT Start Time  1120    PT Stop Time  1204    PT Time Calculation (min)  44 min    Activity Tolerance  Patient tolerated treatment well    Behavior During Therapy  [facility] for tasks assessed/performed       Past Medical History:  Diagnosis Date  . Anxiety   . Asthma   . Carpal tunnel syndrome    R hand   . Colon polyps   . Depression   . Eosinophilic esophagitis    noted in [place] with GI path report 09/29/14   . Esophageal stricture    s/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 Edwin Cap   . Herniated disc, cervical    s/p epidural injection  . Hyperlipidemia   . Low back pain   . Migraines     Past Surgical History:  Procedure Laterality Date  . CESAREAN SECTION    . COLONOSCOPY WITH PROPOFOL N/A 03/27/2017   Procedure: COLONOSCOPY WITH PROPOFOL;  Surgeon: Jonathon Bellows, MD;  Location: [facility] ENDOSCOPY;  Service: Gastroenterology;  Laterality: N/A;  . ESOPHAGOGASTRODUODENOSCOPY    . ESOPHAGOGASTRODUODENOSCOPY (EGD) WITH PROPOFOL N/A 03/27/2017   Procedure: ESOPHAGOGASTRODUODENOSCOPY (EGD) WITH PROPOFOL WITH DILATION;  Surgeon: Jonathon Bellows, MD;  Location: [facility] ENDOSCOPY;  Service: Gastroenterology;  Laterality: N/A;  . THROAT SURGERY  2015  . UMBILICAL HERNIA REPAIR  2006    x 2   w mesh per pt    There were no vitals filed for this visit.  Subjective Assessment - 07/13/17 1121    Subjective  R hip is ok. It hurt the other night. It ached but more of a muscle soreness. Used heat which helped.     Pertinent History  R hip pain since 10 years ago. Pt was doing dishes. Pt turned to the R to place dishes from the sink to the dish drainer 6 inches to her R. Pt felt a pop in her back and her whole R LE went numb.  Had not had an X-ray at the time but was diagnosed with sciatica. Had PT then which helped. Was out of work for about a month due to pain.  Was diagnosed with a hernated disc in her neck about 4 years ago around C5. Had 2-3 MRIs in [place] for her neck. Neck has not bothered her so much but affects her R hand.  Over the past year pt lost 44 lbs purposely.  Had a kidney infection and had to stop exercising.  Also lost another 20 lbs due to stress related issues.  Pt works at [employer] and has to consistently climb ladders.  Climbing up ladders does not really bother her hip but going down does.  Pt also adds that her R hip pain radiates down to her knee (L5 dermatome area)    Patient Stated Goals  Walk up and down stairs without cringing.     Currently in Pain?  No/denies    Pain  Score  0-No pain    Pain Onset  More than a month ago                               PT Education - 07/13/17 1124    Education provided  Yes    Education Details  ther-ex    [employer]) Educated  Patient    Methods  Explanation;Demonstration;Tactile cues;Verbal cues    Comprehension  Returned demonstration;Verbalized understanding         Objectives  Med Bridge Access Code: [email or web address] No latex band allergies   Therapeutic exercise   Standing R LE leg press resisting blue band 10x3 with bilateral UE assist to promote glute muscle strengthening  Side stepping 32 ft to the R and 32 ft to the L with hip symptoms no greater than 3/10 ache level for 2 sets  ascending and descending 4 regular steps with bilateral UE assist, emphasis on femoral control 5x.   Ascending and descending an A frame ladder, 2 rungs   Stepping up with R LE and stepping down with L LE 4x  with neutral thigh. No pain  Walking lunges 32 ft x 2, emphasis on femoral control   Forward wedding march 32 ft x 2 to promote glute strengthening  Standing low rows resisting green band 8x5 seconds, then 10x10 seconds  SLS on R LE, emphasis on level pelvis for isometric glute med muscle use 10x10 seconds with bilateral UE assist   R lateral hip ache afterward. Eases with sitting rest  Seated hip adduction ball and glute max squeeze 10x5 seconds    Improved exercise technique, movement at target joints, use of target muscles after min to mod verbal, visual, tactile cues.   No R hip pain with stair and ladder negotiation with neutral thighs. R hip ache after performing SLS even without ball toss which eases with rest. Continued working on hip strengthening and femoral control to promote ability to perform standing tasks more comfortably for her R hip. No R hip pain or ache after session.        PT Long Term Goals - 06/20/17 1241      PT LONG TERM GOAL #1   Title  Patient will have a decrease in R hip pain to 5/10 at worst to promote ability to climb ladders, negotiate stairs, and sleep on her side.     Baseline  10/10 R hip pain at most for for the past month (06/20/2017)    Time  5    Period  Weeks    Status  New    Target Date  07/27/17      PT LONG TERM GOAL #2   Title  Patient will improve her FOTO (hip) score by at least 10 points as a demonstration of improved function.     Baseline  50 (FOTO) 06/20/2017    Time  5    Period  Weeks    Status  New    Target Date  07/27/17      PT LONG TERM GOAL #3   Title  Patient will improve R hip strength by at least 1/2 MMT grade to promote ability to negotiate stairs, climb ladders.     Time  5    Period  Weeks    Status  New    Target Date  07/27/17      PT LONG TERM GOAL #4   Title  Pt will  report decreased difficulty climbing stairs and ladders to promote ability to perform work tasks.     Baseline  Pt states increased  difficulty with stair negotiation and ladder climbing due to pain (06/20/2017)    Time  5    Period  Weeks    Status  New    Target Date  07/27/17      PT LONG TERM GOAL #5   Title  Patient will improve her LEFS by at least 9 points as a demonstration of improved function.     Baseline  55/80 (06/20/2017)    Time  5    Period  Weeks    Status  New    Target Date  07/27/17            Plan - 07/13/17 1124    Clinical Impression Statement  No R hip pain with stair and ladder negotiation with neutral thighs. R hip ache after performing SLS even without ball toss which eases with rest. Continued working on hip strengthening and femoral control to promote ability to perform standing tasks more comfortably for her R hip. No R hip pain or ache after session.     Rehab Potential  Fair    Clinical Impairments Affecting Rehab Potential  Chronicity of condition, pain, TTP, hip weakness    PT Frequency  2x / week    PT Duration  Other (comment) 5 weeks    PT Treatment/Interventions  Manual techniques;Therapeutic exercise;Therapeutic activities;Dry needling;Aquatic Therapy;Electrical Stimulation;Iontophoresis 4mg /ml Dexamethasone;Traction;Ultrasound;Gait training;Neuromuscular re-education;Patient/family education    PT Next Visit Plan  hip strengthening, lumbopelvic femoral control, trunk strengthening, manual techniques and modalities PRN    Consulted and Agree with Plan of Care  Patient       Patient will benefit from skilled therapeutic intervention in order to improve the following deficits and impairments:  Pain, Postural dysfunction, Improper body mechanics, Decreased strength, Difficulty walking  Visit Diagnosis: Pain in right hip  Muscle weakness (generalized)     Problem List Patient Active Problem List   Diagnosis Date Noted  . Hypertriglyceridemia 04/04/2017  . Breast cyst, left 04/04/2017  . Aortic atherosclerosis ([place]) 04/04/2017  . Anxiety and depression 03/08/2017  .  Dysphagia 03/08/2017  . Hip pain-right 03/08/2017  . Low back pain 03/08/2017  . Vaginal discharge 03/08/2017  . Flank pain 10/18/2016  . Asthma 06/01/2016    Joneen Boers PT, DPT   07/13/2017, 12:23 PM  [place] [facility] [address], [place], [address] Phone: [phone]   Fax:  [phone]  Name: Leah [name] MRN: [id number] Date of Birth: [birth year]

## 2017-07-18 ENCOUNTER — Ambulatory Visit: Payer: 59

## 2017-07-18 DIAGNOSIS — M25551 Pain in right hip: Secondary | ICD-10-CM | POA: Diagnosis not present

## 2017-07-18 DIAGNOSIS — M6281 Muscle weakness (generalized): Secondary | ICD-10-CM

## 2017-07-18 NOTE — Therapy (Signed)
[place] [facility] [address], [place], [address] Phone: [phone]   Fax:  [phone]  Physical Therapy Treatment  Patient Details  Name: Leah [name] MRN: [id number] Date of Birth: 1965-05-28 Referring Provider: Delano Metz, FNP   Encounter Date: 07/18/2017  PT End of Session - 07/18/17 1115    Visit Number  5    Number of Visits  11    Date for PT Re-Evaluation  07/27/17    PT Start Time  1115    PT Stop Time  1159    PT Time Calculation (min)  44 min    Activity Tolerance  Patient tolerated treatment well    Behavior During Therapy  [facility] for tasks assessed/performed       Past Medical History:  Diagnosis Date  . Anxiety   . Asthma   . Carpal tunnel syndrome    R hand   . Colon polyps   . Depression   . Eosinophilic esophagitis    noted in [place] with GI path report 09/29/14   . Esophageal stricture    s/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 Edwin Cap   . Herniated disc, cervical    s/p epidural injection  . Hyperlipidemia   . Low back pain   . Migraines     Past Surgical History:  Procedure Laterality Date  . CESAREAN SECTION    . COLONOSCOPY WITH PROPOFOL N/A 03/27/2017   Procedure: COLONOSCOPY WITH PROPOFOL;  Surgeon: Jonathon Bellows, MD;  Location: [facility] ENDOSCOPY;  Service: Gastroenterology;  Laterality: N/A;  . ESOPHAGOGASTRODUODENOSCOPY    . ESOPHAGOGASTRODUODENOSCOPY (EGD) WITH PROPOFOL N/A 03/27/2017   Procedure: ESOPHAGOGASTRODUODENOSCOPY (EGD) WITH PROPOFOL WITH DILATION;  Surgeon: Jonathon Bellows, MD;  Location: [facility] ENDOSCOPY;  Service: Gastroenterology;  Laterality: N/A;  . THROAT SURGERY  2015  . UMBILICAL HERNIA REPAIR  2006    x 2   w mesh per pt    There were no vitals filed for this visit.  Subjective Assessment - 07/18/17 1116    Subjective  R hip aches a little. 3/10 ache currently. R hip was fine after last session. Was a little achy at night  after work. Does not slow down, just goes. Went hiking 2-3 miles Saturday but felt it Sunday.  Took ibuprofen and a muscle relaxer before coming to PT today.     Pertinent History  R hip pain since 10 years ago. Pt was doing dishes. Pt turned to the R to place dishes from the sink to the dish drainer 6 inches to her R. Pt felt a pop in her back and her whole R LE went numb.  Had not had an X-ray at the time but was diagnosed with sciatica. Had PT then which helped. Was out of work for about a month due to pain.  Was diagnosed with a hernated disc in her neck about 4 years ago around C5. Had 2-3 MRIs in [place] for her neck. Neck has not bothered her so much but affects her R hand.  Over the past year pt lost 44 lbs purposely.  Had a kidney infection and had to stop exercising.  Also lost another 20 lbs due to stress related issues.  Pt works at [employer] and has to consistently climb ladders.  Climbing up ladders does not really bother her hip but going down does.  Pt also adds that her R hip pain radiates down to her knee (  L5 dermatome area)    Patient Stated Goals  Walk up and down stairs without cringing.     Currently in Pain?  Yes    Pain Score  3     Pain Orientation  Right    Pain Descriptors / Indicators  Aching    Pain Onset  More than a month ago                               PT Education - 07/18/17 1120    Education provided  Yes    Education Details  ther-ex, HEP    Person(s) Educated  Patient    Methods  Explanation;Demonstration;Tactile cues;Verbal cues;Handout    Comprehension  Returned demonstration;Verbalized understanding         Objectives  Med Bridge Access Code: [email or web address] No latex band allergies   Therapeutic exercise   Seated hip adduction ball and glute max squeeze 10x5 seconds for 3 sets. No R lateral hip ache in sitting and walking afterwards  Supine R hip in slight IR position: gentle R hip ER with manual resistance  10x3 with 5 second holds   After manual therapy Seated R hip IR at pain free range 10x3  Seated R hip flexion 10x3   Standing R LE leg press resisting blue band 10x3 with bilateral UE assist to promote glute muscle strengthening    Improved exercise technique, movement at target joints, use of target muscles after min to mod verbal, visual, tactile cues.    Manual therapy   Inferior and lateral  glide R hip with strap grade 3   Supine R hip flexion with inferior glide R hip with belt 10x3  Supine R hip IR with lateral glide R hip with belt 10x3   No pain with R hip IR and R hip flexion in supine with inferior and lateral glide to R hip  Improved supine R hip IR at 90/90 observed after manual therapy     No R hip ache or pain after session. Improved R hip IR observed and decreased pain with supine R hip flexion as well as R hip IR after manual therapy to promote inferior and lateral glide to femur at R hip joint.        PT Long Term Goals - 06/20/17 1241      PT LONG TERM GOAL #1   Title  Patient will have a decrease in R hip pain to 5/10 at worst to promote ability to climb ladders, negotiate stairs, and sleep on her side.     Baseline  10/10 R hip pain at most for for the past month (06/20/2017)    Time  5    Period  Weeks    Status  New    Target Date  07/27/17      PT LONG TERM GOAL #2   Title  Patient will improve her FOTO (hip) score by at least 10 points as a demonstration of improved function.     Baseline  50 (FOTO) 06/20/2017    Time  5    Period  Weeks    Status  New    Target Date  07/27/17      PT LONG TERM GOAL #3   Title  Patient will improve R hip strength by at least 1/2 MMT grade to promote ability to negotiate stairs, climb ladders.     Time  5    Period  Weeks  Status  New    Target Date  07/27/17      PT LONG TERM GOAL #4   Title  Pt will report decreased difficulty climbing stairs and ladders to promote ability to perform work tasks.      Baseline  Pt states increased difficulty with stair negotiation and ladder climbing due to pain (06/20/2017)    Time  5    Period  Weeks    Status  New    Target Date  07/27/17      PT LONG TERM GOAL #5   Title  Patient will improve her LEFS by at least 9 points as a demonstration of improved function.     Baseline  55/80 (06/20/2017)    Time  5    Period  Weeks    Status  New    Target Date  07/27/17            Plan - 07/18/17 1114    Clinical Impression Statement  No R hip ache or pain after session. Improved R hip IR observed and decreased pain with supine R hip flexion as well as R hip IR after manual therapy to promote inferior and lateral glide to femur at R hip joint.     Rehab Potential  Fair    Clinical Impairments Affecting Rehab Potential  Chronicity of condition, pain, TTP, hip weakness    PT Frequency  2x / week    PT Duration  Other (comment) 5 weeks    PT Treatment/Interventions  Manual techniques;Therapeutic exercise;Therapeutic activities;Dry needling;Aquatic Therapy;Electrical Stimulation;Iontophoresis 4mg /ml Dexamethasone;Traction;Ultrasound;Gait training;Neuromuscular re-education;Patient/family education    PT Next Visit Plan  hip strengthening, lumbopelvic femoral control, trunk strengthening, manual techniques and modalities PRN    Consulted and Agree with Plan of Care  Patient       Patient will benefit from skilled therapeutic intervention in order to improve the following deficits and impairments:  Pain, Postural dysfunction, Improper body mechanics, Decreased strength, Difficulty walking  Visit Diagnosis: Pain in right hip  Muscle weakness (generalized)     Problem List Patient Active Problem List   Diagnosis Date Noted  . Hypertriglyceridemia 04/04/2017  . Breast cyst, left 04/04/2017  . Aortic atherosclerosis ([place]) 04/04/2017  . Anxiety and depression 03/08/2017  . Dysphagia 03/08/2017  . Hip pain-right 03/08/2017  . Low back pain  03/08/2017  . Vaginal discharge 03/08/2017  . Flank pain 10/18/2016  . Asthma 06/01/2016    Joneen Boers PT, DPT   07/18/2017, 12:19 PM  [facility] [address] [place], [address] Phone: [phone]   Fax:  [phone]  Name: Leah [name] MRN: [id number] Date of Birth: 09/19/1965

## 2017-07-18 NOTE — Patient Instructions (Addendum)
(  Home) Hip: Internal Rotation - Sitting    Comfortable range   Pull foot out. May hold chair to keep body still. Repeat ___10_ times per set. Do __3__ sets per session.   Copyright  [employer]. All rights reserved.       Sitting on a chair, raise your right knee up comfortably for 3 sets of 10 daily.

## 2017-07-19 ENCOUNTER — Ambulatory Visit: Payer: 59

## 2017-07-19 DIAGNOSIS — M25551 Pain in right hip: Secondary | ICD-10-CM | POA: Diagnosis not present

## 2017-07-19 DIAGNOSIS — M6281 Muscle weakness (generalized): Secondary | ICD-10-CM

## 2017-07-19 NOTE — Therapy (Signed)
[facility] [address] [place], [address] Phone: [phone]   Fax:  [phone]  Physical Therapy Treatment  Patient Details  Name: Leah [name] MRN: [id number] Date of Birth: 08/14/1965 Referring Provider: Delano Metz, FNP   Encounter Date: 07/19/2017  PT End of Session - 07/19/17 1605    Visit Number  6    Number of Visits  11    Date for PT Re-Evaluation  07/27/17    PT Start Time  9381    PT Stop Time  8299    PT Time Calculation (min)  41 min    Activity Tolerance  Patient tolerated treatment well    Behavior During Therapy  [facility] for tasks assessed/performed       Past Medical History:  Diagnosis Date  . Anxiety   . Asthma   . Carpal tunnel syndrome    R hand   . Colon polyps   . Depression   . Eosinophilic esophagitis    noted in [place] with GI path report 09/29/14   . Esophageal stricture    s/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 Edwin Cap   . Herniated disc, cervical    s/p epidural injection  . Hyperlipidemia   . Low back pain   . Migraines     Past Surgical History:  Procedure Laterality Date  . CESAREAN SECTION    . COLONOSCOPY WITH PROPOFOL N/A 03/27/2017   Procedure: COLONOSCOPY WITH PROPOFOL;  Surgeon: Jonathon Bellows, MD;  Location: [facility] ENDOSCOPY;  Service: Gastroenterology;  Laterality: N/A;  . ESOPHAGOGASTRODUODENOSCOPY    . ESOPHAGOGASTRODUODENOSCOPY (EGD) WITH PROPOFOL N/A 03/27/2017   Procedure: ESOPHAGOGASTRODUODENOSCOPY (EGD) WITH PROPOFOL WITH DILATION;  Surgeon: Jonathon Bellows, MD;  Location: [facility] ENDOSCOPY;  Service: Gastroenterology;  Laterality: N/A;  . THROAT SURGERY  2015  . UMBILICAL HERNIA REPAIR  2006    x 2   w mesh per pt    There were no vitals filed for this visit.  Subjective Assessment - 07/19/17 1606    Subjective  R hip is not too bad. Did a lot of ladders last night and R hip bothered her. The treatment yesterday  helped with the ladder and stair negotiation.     Pertinent History  R hip pain since 10 years ago. Pt was doing dishes. Pt turned to the R to place dishes from the sink to the dish drainer 6 inches to her R. Pt felt a pop in her back and her whole R LE went numb.  Had not had an X-ray at the time but was diagnosed with sciatica. Had PT then which helped. Was out of work for about a month due to pain.  Was diagnosed with a hernated disc in her neck about 4 years ago around C5. Had 2-3 MRIs in [place] for her neck. Neck has not bothered her so much but affects her R hand.  Over the past year pt lost 44 lbs purposely.  Had a kidney infection and had to stop exercising.  Also lost another 20 lbs due to stress related issues.  Pt works at [employer] and has to consistently climb ladders.  Climbing up ladders does not really bother her hip but going down does.  Pt also adds that her R hip pain radiates down to her knee (L5 dermatome area)    Patient Stated Goals  Walk up and down stairs without cringing.     Currently  in Pain?  No/denies    Pain Score  0-No pain    Pain Onset  More than a month ago                              Objectives  Med Bridge Access Code: [email or web address] No latex band allergies  Manual therapy  Inferior and lateral  glide R hip with strap grade 3   Supine R hip flexion with inferior glide R hip with belt 10x3  Supine R hip IR with lateral glide R hip with belt 10x3    Therapeutic exercise    After manual therapy Seated R hip IR at pain free range 10x3  Standing R LE leg press resisting blue band 10x,   then with double blue band 10x3 with bilateral UE assist to promote glute muscle strengthening  Forward step up onto regular step with eccentric lowering with bilateral UE assist 10x3, emphasis on femoral control  Seated R hip flexion 10x3  Forward step up onto bosu ball with one UE assist 10x3 with R LE   Lateral step up  onto bosu ball with one UE assist 10x, then 3x with R LE  with bilateral UE assist. R knee crepitus, non-painful.   Standing R hip abduction 10x, then 10x5 seconds   Improved exercise technique, movement at target joints, use of target muscles after mod verbal, visual, tactile cues.     Continued working on improving R hip joint mobility to promote hip flexion and IR secondary to pt subjective reports of better able to negotiate stairs and ladders yesterday after treatment. Also worked on glute strengthening and femoral control to help decrease lateral hip discomfort. Pt tolerated session well without aggravation of symptoms.          PT Education - 07/19/17 1628    Education provided  Yes    Education Details  ther-ex    Person(s) Educated  Patient    Methods  Explanation;Demonstration;Tactile cues;Verbal cues    Comprehension  Returned demonstration;Verbalized understanding          PT Long Term Goals - 06/20/17 1241      PT LONG TERM GOAL #1   Title  Patient will have a decrease in R hip pain to 5/10 at worst to promote ability to climb ladders, negotiate stairs, and sleep on her side.     Baseline  10/10 R hip pain at most for for the past month (06/20/2017)    Time  5    Period  Weeks    Status  New    Target Date  07/27/17      PT LONG TERM GOAL #2   Title  Patient will improve her FOTO (hip) score by at least 10 points as a demonstration of improved function.     Baseline  50 (FOTO) 06/20/2017    Time  5    Period  Weeks    Status  New    Target Date  07/27/17      PT LONG TERM GOAL #3   Title  Patient will improve R hip strength by at least 1/2 MMT grade to promote ability to negotiate stairs, climb ladders.     Time  5    Period  Weeks    Status  New    Target Date  07/27/17      PT LONG TERM GOAL #4   Title  Pt will report decreased difficulty  climbing stairs and ladders to promote ability to perform work tasks.     Baseline  Pt states increased  difficulty with stair negotiation and ladder climbing due to pain (06/20/2017)    Time  5    Period  Weeks    Status  New    Target Date  07/27/17      PT LONG TERM GOAL #5   Title  Patient will improve her LEFS by at least 9 points as a demonstration of improved function.     Baseline  55/80 (06/20/2017)    Time  5    Period  Weeks    Status  New    Target Date  07/27/17            Plan - 07/19/17 1631    Clinical Impression Statement  Continued working on improving R hip joint mobility to promote hip flexion and IR secondary to pt subjective reports of better able to negotiate stairs and ladders yesterday after treatment. Also worked on glute strengthening and femoral control to help decrease lateral hip discomfort. Pt tolerated session well without aggravation of symptoms.     Rehab Potential  Fair    Clinical Impairments Affecting Rehab Potential  Chronicity of condition, pain, TTP, hip weakness    PT Frequency  2x / week    PT Duration  Other (comment) 5 weeks    PT Treatment/Interventions  Manual techniques;Therapeutic exercise;Therapeutic activities;Dry needling;Aquatic Therapy;Electrical Stimulation;Iontophoresis 4mg /ml Dexamethasone;Traction;Ultrasound;Gait training;Neuromuscular re-education;Patient/family education    PT Next Visit Plan  hip strengthening, lumbopelvic femoral control, trunk strengthening, manual techniques and modalities PRN    Consulted and Agree with Plan of Care  Patient       Patient will benefit from skilled therapeutic intervention in order to improve the following deficits and impairments:  Pain, Postural dysfunction, Improper body mechanics, Decreased strength, Difficulty walking  Visit Diagnosis: Pain in right hip  Muscle weakness (generalized)     Problem List Patient Active Problem List   Diagnosis Date Noted  . Hypertriglyceridemia 04/04/2017  . Breast cyst, left 04/04/2017  . Aortic atherosclerosis ([place]) 04/04/2017  . Anxiety and  depression 03/08/2017  . Dysphagia 03/08/2017  . Hip pain-right 03/08/2017  . Low back pain 03/08/2017  . Vaginal discharge 03/08/2017  . Flank pain 10/18/2016  . Asthma 06/01/2016    Joneen Boers PT, DPT   07/19/2017, 4:58 PM  [facility] [address], [place], [address] Phone: [phone]   Fax:  [phone]  Name: Leah [name] MRN: [id number] Date of Birth: 20-Dec-1965

## 2017-07-19 NOTE — Patient Instructions (Signed)
Gave seated R hip IR and hip flexion 10x3 each pain free range daily as part of her HEP. Pt demonstrated and verbalized understanding.

## 2017-07-25 ENCOUNTER — Ambulatory Visit: Payer: 59

## 2017-07-25 DIAGNOSIS — M25551 Pain in right hip: Secondary | ICD-10-CM

## 2017-07-25 DIAGNOSIS — M6281 Muscle weakness (generalized): Secondary | ICD-10-CM

## 2017-07-25 NOTE — Therapy (Signed)
[facility] [address], [place], [address] Phone: [phone]   Fax:  [phone]  Physical Therapy Treatment And Progress Report  Patient Details  Name: Leah [name] MRN: [id number] Date of Birth: 08/03/1965 Referring Provider: Delano Metz, FNP   Encounter Date: 07/25/2017  PT End of Session - 07/25/17 1116    Visit Number  7    Number of Visits  23    Date for PT Re-Evaluation  09/07/17    PT Start Time  1116    PT Stop Time  1205    PT Time Calculation (min)  49 min    Activity Tolerance  Patient tolerated treatment well    Behavior During Therapy  [facility] for tasks assessed/performed       Past Medical History:  Diagnosis Date  . Anxiety   . Asthma   . Carpal tunnel syndrome    R hand   . Colon polyps   . Depression   . Eosinophilic esophagitis    noted in [place] with GI path report 09/29/14   . Esophageal stricture    s/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 Edwin Cap   . Herniated disc, cervical    s/p epidural injection  . Hyperlipidemia   . Low back pain   . Migraines     Past Surgical History:  Procedure Laterality Date  . CESAREAN SECTION    . COLONOSCOPY WITH PROPOFOL N/A 03/27/2017   Procedure: COLONOSCOPY WITH PROPOFOL;  Surgeon: Jonathon Bellows, MD;  Location: [facility] ENDOSCOPY;  Service: Gastroenterology;  Laterality: N/A;  . ESOPHAGOGASTRODUODENOSCOPY    . ESOPHAGOGASTRODUODENOSCOPY (EGD) WITH PROPOFOL N/A 03/27/2017   Procedure: ESOPHAGOGASTRODUODENOSCOPY (EGD) WITH PROPOFOL WITH DILATION;  Surgeon: Jonathon Bellows, MD;  Location: [facility] ENDOSCOPY;  Service: Gastroenterology;  Laterality: N/A;  . THROAT SURGERY  2015  . UMBILICAL HERNIA REPAIR  2006    x 2   w mesh per pt    There were no vitals filed for this visit.  Subjective Assessment - 07/25/17 1117    Subjective  R hip is not so bad. Has been bothering her for the past few days. The ladder and  stair climbing is probably a little better. 8/10 R hip pain at most for the past 7 days.  Went hiking last Friday which bothered her R hip.  Feels like she is heading towards her goals with PT.  Feels like she has come a long way but feels like she needs more PT.     Pertinent History  R hip pain since 10 years ago. Pt was doing dishes. Pt turned to the R to place dishes from the sink to the dish drainer 6 inches to her R. Pt felt a pop in her back and her whole R LE went numb.  Had not had an X-ray at the time but was diagnosed with sciatica. Had PT then which helped. Was out of work for about a month due to pain.  Was diagnosed with a hernated disc in her neck about 4 years ago around C5. Had 2-3 MRIs in [place] for her neck. Neck has not bothered her so much but affects her R hand.  Over the past year pt lost 44 lbs purposely.  Had a kidney infection and had to stop exercising.  Also lost another 20 lbs due to stress related issues.  Pt works at [employer] and has to consistently climb ladders.  Climbing  up ladders does not really bother her hip but going down does.  Pt also adds that her R hip pain radiates down to her knee (L5 dermatome area)    Patient Stated Goals  Walk up and down stairs without cringing.     Currently in Pain?  Yes    Pain Score  5  since late Saturday ; sore and tight    Pain Onset  More than a month ago         [facility] PT Assessment - 07/25/17 1124      Observation/Other Assessments   Focus on Therapeutic Outcomes (FOTO)   62 (hip)    Lower Extremity Functional Scale   59/80      Strength   Right Hip Flexion  4/5 with lateral hip pain    Right Hip Extension  4-/5 with pain    Right Hip ABduction  4/5 with pain                           PT Education - 07/25/17 1148    Education provided  Yes    Education Details  ther-ex, plan of care    Person(s) Educated  Patient    Methods  Explanation;Demonstration;Tactile cues;Verbal cues     Comprehension  Returned demonstration;Verbalized understanding          Objectives  Med Bridge Access Code: [email or web address] No latex band allergies  Manual therapy  Inferior and lateralglide R hip with strap grade 3   Supine R hip flexion with inferior glide R hip with belt 10x3  Supine R hip IR with lateral glide R hip with belt 10x3   Decreased R hip pain to 3/10     Therapeutic exercise   Reviewed plan of care 2x/week for 6 weeks   Directed patient with seated manually resisted R hip flexion, S/L hip abduction, prone hip extension 1x each way  Reviewed progress/current status with strength with pt.   Seated hip adduction small physioball squeeze 10x10 seconds for 2 sets  Standing R LE leg press resisting double blue band 10x3 with bilateral UE assist  to promote glute muscle strengthening  Standing R hip abduction 10x5 seconds for 2 sets   Forward step up onto bosu ball with one UE assist 10x then 6x with R LE and L UE assist. R knee non-painful crepitus.   Improved exercise technique, movement at target joints, use of target muscles after min to mod verbal, visual, tactile cues.    Pt demonstrates improved R hip flexion and abduction strength, overall decreased R hip pain level at worst, improved femoral control and improved ability to perform functional tasks since initial evaluation. Pt still demonstrates R lateral hip pain, weakness, and difficulty negotiating stairs and ladders due to her symptoms and would benefit from continued skilled physical therapy services to address the aforementioned deficits.       PT Long Term Goals - 07/25/17 1149      PT LONG TERM GOAL #1   Title  Patient will have a decrease in R hip pain to 5/10 at worst to promote ability to climb ladders, negotiate stairs, and sleep on her side.     Baseline  10/10 R hip pain at most for for the past month (06/20/2017); 8/10 at most for the past 7 days (07/25/2017)    Time  6     Period  Weeks    Status  On-going    Target  Date  09/07/17      PT LONG TERM GOAL #2   Title  Patient will improve her FOTO (hip) score by at least 10 points as a demonstration of improved function.     Baseline  50 (FOTO) 06/20/2017; 62 (07/25/2017)    Time  6    Period  Weeks    Status  Achieved    Target Date  09/07/17      PT LONG TERM GOAL #3   Title  Patient will improve R hip strength by at least 1/2 MMT grade to promote ability to negotiate stairs, climb ladders.     Time  6    Period  Weeks    Status  On-going    Target Date  09/07/17      PT LONG TERM GOAL #4   Title  Pt will report decreased difficulty climbing stairs and ladders to promote ability to perform work tasks.     Baseline  Pt states increased difficulty with stair negotiation and ladder climbing due to pain (06/20/2017); Ladder and stair climbing are probably a little better per pt (07/25/2017)    Time  6    Period  Weeks    Status  On-going    Target Date  09/07/17      PT LONG TERM GOAL #5   Title  Patient will improve her LEFS by at least 9 points as a demonstration of improved function.     Baseline  55/80 (06/20/2017); 59/80 (07/25/2017)    Time  6    Period  Weeks    Status  Partially Met    Target Date  09/07/17            Plan - 07/25/17 1113    Clinical Impression Statement  Pt demonstrates improved R hip flexion and abduction strength, overall decreased R hip pain level at worst, improved femoral control and improved ability to perform functional tasks since initial evaluation. Pt still demonstrates R lateral hip pain, weakness, and difficulty negotiating stairs and ladders due to her symptoms and would benefit from continued skilled physical therapy services to address the aforementioned deficits.     History and Personal Factors relevant to plan of care:  Chronicity of condition, hip weakness, difficulty climbing ladders, stairs, tolerating prolonged sitting. TTP    Clinical Presentation  Stable     Clinical Presentation due to:  Decreased overall hip pain, improving function    Clinical Decision Making  Low    Rehab Potential  Fair    Clinical Impairments Affecting Rehab Potential  Chronicity of condition, pain, TTP, hip weakness    PT Frequency  2x / week    PT Duration  6 weeks    PT Treatment/Interventions  Manual techniques;Therapeutic exercise;Therapeutic activities;Dry needling;Aquatic Therapy;Electrical Stimulation;Iontophoresis 55m/ml Dexamethasone;Traction;Ultrasound;Gait training;Neuromuscular re-education;Patient/family education    PT Next Visit Plan  hip strengthening, lumbopelvic femoral control, trunk strengthening, manual techniques and modalities PRN    Consulted and Agree with Plan of Care  Patient       Patient will benefit from skilled therapeutic intervention in order to improve the following deficits and impairments:  Pain, Postural dysfunction, Improper body mechanics, Decreased strength, Difficulty walking  Visit Diagnosis: Pain in right hip - Plan: PT plan of care cert/re-cert  Muscle weakness (generalized) - Plan: PT plan of care cert/re-cert     Problem List Patient Active Problem List   Diagnosis Date Noted  . Hypertriglyceridemia 04/04/2017  . Breast cyst, left 04/04/2017  .  Aortic atherosclerosis ([place]) 04/04/2017  . Anxiety and depression 03/08/2017  . Dysphagia 03/08/2017  . Hip pain-right 03/08/2017  . Low back pain 03/08/2017  . Vaginal discharge 03/08/2017  . Flank pain 10/18/2016  . Asthma 06/01/2016    Thank you for your referral.  Joneen Boers PT, DPT   07/25/2017, 12:39 PM  [facility] [address], [place], [address] Phone: [phone]   Fax:  [phone]  Name: Roselynn Whitacre MRN: [id number] Date of Birth: [birth year]

## 2017-07-27 ENCOUNTER — Ambulatory Visit: Payer: 59

## 2017-07-27 DIAGNOSIS — M25551 Pain in right hip: Secondary | ICD-10-CM | POA: Diagnosis not present

## 2017-07-27 DIAGNOSIS — M6281 Muscle weakness (generalized): Secondary | ICD-10-CM

## 2017-07-27 NOTE — Therapy (Signed)
[place] [facility] [address] [place], [address] Phone: [phone]   Fax:  [phone]  Physical Therapy Treatment  Patient Details  Name: Leah [name] MRN: [id number] Date of Birth: Aug 04, 1965 Referring Provider: Delano Metz, FNP   Encounter Date: 07/27/2017  PT End of Session - 07/27/17 1032    Visit Number  8    Number of Visits  23    Date for PT Re-Evaluation  09/07/17    PT Start Time  0277    PT Stop Time  1118    PT Time Calculation (min)  46 min    Activity Tolerance  Patient tolerated treatment well    Behavior During Therapy  [facility] for tasks assessed/performed       Past Medical History:  Diagnosis Date  . Anxiety   . Asthma   . Carpal tunnel syndrome    R hand   . Colon polyps   . Depression   . Eosinophilic esophagitis    noted in [place] with GI path report 09/29/14   . Esophageal stricture    s/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 Edwin Cap   . Herniated disc, cervical    s/p epidural injection  . Hyperlipidemia   . Low back pain   . Migraines     Past Surgical History:  Procedure Laterality Date  . CESAREAN SECTION    . COLONOSCOPY WITH PROPOFOL N/A 03/27/2017   Procedure: COLONOSCOPY WITH PROPOFOL;  Surgeon: Jonathon Bellows, MD;  Location: [facility] ENDOSCOPY;  Service: Gastroenterology;  Laterality: N/A;  . ESOPHAGOGASTRODUODENOSCOPY    . ESOPHAGOGASTRODUODENOSCOPY (EGD) WITH PROPOFOL N/A 03/27/2017   Procedure: ESOPHAGOGASTRODUODENOSCOPY (EGD) WITH PROPOFOL WITH DILATION;  Surgeon: Jonathon Bellows, MD;  Location: [facility] ENDOSCOPY;  Service: Gastroenterology;  Laterality: N/A;  . THROAT SURGERY  2015  . UMBILICAL HERNIA REPAIR  2006    x 2   w mesh per pt    There were no vitals filed for this visit.  Subjective Assessment - 07/27/17 1034    Subjective  R hip is good. A little stiff yesterday. Tuesday night was painful. R hip was fine after last session and  most of the night at work until the last hour.  Worked 6.5 hours that day.  Worked at [employer] and [employer] after [employer] (worked at [employer] earlier).       Pertinent History  R hip pain since 10 years ago. Pt was doing dishes. Pt turned to the R to place dishes from the sink to the dish drainer 6 inches to her R. Pt felt a pop in her back and her whole R LE went numb.  Had not had an X-ray at the time but was diagnosed with sciatica. Had PT then which helped. Was out of work for about a month due to pain.  Was diagnosed with a hernated disc in her neck about 4 years ago around C5. Had 2-3 MRIs in [place] for her neck. Neck has not bothered her so much but affects her R hand.  Over the past year pt lost 44 lbs purposely.  Had a kidney infection and had to stop exercising.  Also lost another 20 lbs due to stress related issues.  Pt works at [employer] and has to consistently climb ladders.  Climbing up ladders does not really bother her hip but going down does.  Pt also adds that her R hip pain radiates down to  her knee (L5 dermatome area)    Patient Stated Goals  Walk up and down stairs without cringing.     Currently in Pain?  No/denies    Pain Score  0-No pain    Pain Onset  More than a month ago         [facility] PT Assessment - 07/27/17 0001      PROM   Overall PROM Comments  supine R hip IR at 90/90: 22 degrees with R hip pain                           PT Education - 07/27/17 1101    Education provided  Yes    Education Details  ther-ex    [employer]) Educated  Patient    Methods  Explanation;Demonstration;Tactile cues;Verbal cues    Comprehension  Returned demonstration;Verbalized understanding         Objectives  Med Bridge Access Code: [email or web address] No latex band allergies  Manual therapy  Inferior and lateralglide R hip with strap grade 3   Supine R hip flexion with inferior glide R hip with belt 10x3  Supine R hip IR with lateral glide R  hip with belt 10x3           28 degrees supine R hip IR at 90/90 hip postion    no TTP R lateral hip and greater trochanter     Therapeutic exercise    Standing R LE leg press resisting double blue band 10x3 with bilateral UE assist  to promote glute muscle strengthening  Standing R hip abduction 10x5 seconds for 3 sets  Standing R hip extension resisting red band with bilateral UE assist 10x   then 10x2 with 5 second holds to promote glute max strengthening.   Side stepping with yellow band around distal thighs 32 ft to the R and 32 ft to the L.    Improved exercise technique, movement at target joints, use of target muscles after min to mod verbal, visual, tactile cues.   Continued working on improving R hip joint mobility to help decrease stiffness and lateral hip pain. No TTP R lateral hip today. Continued working on non-painfull concecntric hip abduction. Pt tolerated session well without complain of pain.    .      PT Long Term Goals - 07/25/17 1149      PT LONG TERM GOAL #1   Title  Patient will have a decrease in R hip pain to 5/10 at worst to promote ability to climb ladders, negotiate stairs, and sleep on her side.     Baseline  10/10 R hip pain at most for for the past month (06/20/2017); 8/10 at most for the past 7 days (07/25/2017)    Time  6    Period  Weeks    Status  On-going    Target Date  09/07/17      PT LONG TERM GOAL #2   Title  Patient will improve her FOTO (hip) score by at least 10 points as a demonstration of improved function.     Baseline  50 (FOTO) 06/20/2017; 62 (07/25/2017)    Time  6    Period  Weeks    Status  Achieved    Target Date  09/07/17      PT LONG TERM GOAL #3   Title  Patient will improve R hip strength by at least 1/2 MMT grade to promote ability to negotiate stairs, climb  ladders.     Time  6    Period  Weeks    Status  On-going    Target Date  09/07/17      PT LONG TERM GOAL #4   Title  Pt will report  decreased difficulty climbing stairs and ladders to promote ability to perform work tasks.     Baseline  Pt states increased difficulty with stair negotiation and ladder climbing due to pain (06/20/2017); Ladder and stair climbing are probably a little better per pt (07/25/2017)    Time  6    Period  Weeks    Status  On-going    Target Date  09/07/17      PT LONG TERM GOAL #5   Title  Patient will improve her LEFS by at least 9 points as a demonstration of improved function.     Baseline  55/80 (06/20/2017); 59/80 (07/25/2017)    Time  6    Period  Weeks    Status  Partially Met    Target Date  09/07/17            Plan - 07/27/17 1101    Clinical Impression Statement  Continued working on improving R hip joint mobility to help decrease stiffness and lateral hip pain. No TTP R lateral hip today. Continued working on non-painfull concecntric hip abduction. Pt tolerated session well without complain of pain.     Rehab Potential  Fair    Clinical Impairments Affecting Rehab Potential  Chronicity of condition, pain, TTP, hip weakness    PT Frequency  2x / week    PT Duration  6 weeks    PT Treatment/Interventions  Manual techniques;Therapeutic exercise;Therapeutic activities;Dry needling;Aquatic Therapy;Electrical Stimulation;Iontophoresis 1m/ml Dexamethasone;Traction;Ultrasound;Gait training;Neuromuscular re-education;Patient/family education    PT Next Visit Plan  hip strengthening, lumbopelvic femoral control, trunk strengthening, manual techniques and modalities PRN    Consulted and Agree with Plan of Care  Patient       Patient will benefit from skilled therapeutic intervention in order to improve the following deficits and impairments:  Pain, Postural dysfunction, Improper body mechanics, Decreased strength, Difficulty walking  Visit Diagnosis: Pain in right hip  Muscle weakness (generalized)     Problem List Patient Active Problem List   Diagnosis Date Noted  .  Hypertriglyceridemia 04/04/2017  . Breast cyst, left 04/04/2017  . Aortic atherosclerosis ([place] 04/04/2017  . Anxiety and depression 03/08/2017  . Dysphagia 03/08/2017  . Hip pain-right 03/08/2017  . Low back pain 03/08/2017  . Vaginal discharge 03/08/2017  . Flank pain 10/18/2016  . Asthma 06/01/2016   MJoneen BoersPT, DPT   07/27/2017, 12:26 PM  [facility] [address] [place] [address]Phone: [phone]  Fax:  [phone] Name: KKashish YglesiasMRN: [id number] of Birth: 103-14-1967

## 2017-08-01 ENCOUNTER — Ambulatory Visit: Payer: 59

## 2017-08-01 DIAGNOSIS — M25551 Pain in right hip: Secondary | ICD-10-CM | POA: Diagnosis not present

## 2017-08-01 DIAGNOSIS — M6281 Muscle weakness (generalized): Secondary | ICD-10-CM

## 2017-08-01 NOTE — Therapy (Signed)
[place] [facility] [address], [place], [address] Phone: [phone]   Fax:  [phone]  Physical Therapy Treatment  Patient Details  Name: Leah [name] MRN: [id number] Date of Birth: 1965/10/01 Referring Provider: Delano Metz, FNP   Encounter Date: 08/01/2017  PT End of Session - 08/01/17 1127    Visit Number  9    Number of Visits  23    Date for PT Re-Evaluation  09/07/17    PT Start Time  1127    PT Stop Time  1207    PT Time Calculation (min)  40 min    Activity Tolerance  Patient tolerated treatment well    Behavior During Therapy  [facility] for tasks assessed/performed       Past Medical History:  Diagnosis Date  . Anxiety   . Asthma   . Carpal tunnel syndrome    R hand   . Colon polyps   . Depression   . Eosinophilic esophagitis    noted in [place] with GI path report 09/29/14   . Esophageal stricture    s/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 Edwin Cap   . Herniated disc, cervical    s/p epidural injection  . Hyperlipidemia   . Low back pain   . Migraines     Past Surgical History:  Procedure Laterality Date  . CESAREAN SECTION    . COLONOSCOPY WITH PROPOFOL N/A 03/27/2017   Procedure: COLONOSCOPY WITH PROPOFOL;  Surgeon: Jonathon Bellows, MD;  Location: [facility] ENDOSCOPY;  Service: Gastroenterology;  Laterality: N/A;  . ESOPHAGOGASTRODUODENOSCOPY    . ESOPHAGOGASTRODUODENOSCOPY (EGD) WITH PROPOFOL N/A 03/27/2017   Procedure: ESOPHAGOGASTRODUODENOSCOPY (EGD) WITH PROPOFOL WITH DILATION;  Surgeon: Jonathon Bellows, MD;  [facility] ENDOSCOPY;  Service: Gastroenterology;  Laterality: N/A;  . THROAT SURGERY  2015  . UMBILICAL HERNIA REPAIR  2006    x 2   w mesh per pt    There were no vitals filed for this visit.  Subjective Assessment - 08/01/17 1128    Subjective  R hip is not too bad. Went on an all day motorcycle ride Sunday as well. 3/10 currently, 7/10 at most for  the past 5 days after the motor cycle ride to [place] and back.      Pertinent History  R hip pain since 10 years ago. Pt was doing dishes. Pt turned to the R to place dishes from the sink to the dish drainer 6 inches to her R. Pt felt a pop in her back and her whole R LE went numb.  Had not had an X-ray at the time but was diagnosed with sciatica. Had PT then which helped. Was out of work for about a month due to pain.  Was diagnosed with a hernated disc in her neck about 4 years ago around C5. Had 2-3 MRIs in [place] for her neck. Neck has not bothered her so much but affects her R hand.  Over the past year pt lost 44 lbs purposely.  Had a kidney infection and had to stop exercising.  Also lost another 20 lbs due to stress related issues.  Pt works at [employer] and has to consistently climb ladders.  Climbing up ladders does not really bother her hip but going down does.  Pt also adds that her R hip pain radiates down to her knee (L5 dermatome area)    Patient Stated Goals  Walk up and down  stairs without cringing.     Currently in Pain?  Yes    Pain Score  3     Pain Onset  More than a month ago                               PT Education - 08/01/17 1147    Education provided  Yes    Education Details  ther-ex    [employer]) Educated  Patient    Methods  Explanation;Demonstration;Tactile cues;Verbal cues    Comprehension  Returned demonstration;Verbalized understanding         Objectives  Med Bridge Access Code: [email or web address] No latex band allergies  Manual therapy  Inferior and lateralglide R hip with strap grade 3   Supine R hip flexion with inferior glide R hip with belt 10x3  Supine R hip IR with lateral glide R hip with belt 10x3  No R hip pain after manual therapy     Therapeutic exercise   L S/L R hip reverse clamshell 10x3 Prone R glute max extension 10x3 L S/L R hip abduction 10x3   Supine bridge 10x3 SLS R LE, emphasis  on level pelvis to promote glute med muscle use 10x10 seconds  Side stepping 32 ft to the R and 32 ft to the L,   Then with yellow band at distal thighs       Improved exercise technique, movement at target joints, use of target muscles after min to mod verbal, visual, tactile cues.   No TTP R lateral hip. Continued working on R hip joint mobility as well as isometric and concentric R glute med strengthening to promote proper stress to muscle. No R hip pain after session.      PT Long Term Goals - 07/25/17 1149      PT LONG TERM GOAL #1   Title  Patient will have a decrease in R hip pain to 5/10 at worst to promote ability to climb ladders, negotiate stairs, and sleep on her side.     Baseline  10/10 R hip pain at most for for the past month (06/20/2017); 8/10 at most for the past 7 days (07/25/2017)    Time  6    Period  Weeks    Status  On-going    Target Date  09/07/17      PT LONG TERM GOAL #2   Title  Patient will improve her FOTO (hip) score by at least 10 points as a demonstration of improved function.     Baseline  50 (FOTO) 06/20/2017; 62 (07/25/2017)    Time  6    Period  Weeks    Status  Achieved    Target Date  09/07/17      PT LONG TERM GOAL #3   Title  Patient will improve R hip strength by at least 1/2 MMT grade to promote ability to negotiate stairs, climb ladders.     Time  6    Period  Weeks    Status  On-going    Target Date  09/07/17      PT LONG TERM GOAL #4   Title  Pt will report decreased difficulty climbing stairs and ladders to promote ability to perform work tasks.     Baseline  Pt states increased difficulty with stair negotiation and ladder climbing due to pain (06/20/2017); Ladder and stair climbing are probably a little better per pt (07/25/2017)    Time  6    Period  Weeks    Status  On-going    Target Date  09/07/17      PT LONG TERM GOAL #5   Title  Patient will improve her LEFS by at least 9 points as a demonstration of improved function.      Baseline  55/80 (06/20/2017); 59/80 (07/25/2017)    Time  6    Period  Weeks    Status  Partially Met    Target Date  09/07/17            Plan - 08/01/17 1147    Clinical Impression Statement  No TTP R lateral hip. Continued working on R hip joint mobility as well as isometric and concentric R glute med strengthening to promote proper stress to muscle. No R hip pain after session.     Rehab Potential  Fair    Clinical Impairments Affecting Rehab Potential  Chronicity of condition, pain, TTP, hip weakness    PT Frequency  2x / week    PT Duration  6 weeks    PT Treatment/Interventions  Manual techniques;Therapeutic exercise;Therapeutic activities;Dry needling;Aquatic Therapy;Electrical Stimulation;Iontophoresis 23m/ml Dexamethasone;Traction;Ultrasound;Gait training;Neuromuscular re-education;Patient/family education    PT Next Visit Plan  hip strengthening, lumbopelvic femoral control, trunk strengthening, manual techniques and modalities PRN    Consulted and Agree with Plan of Care  Patient       Patient will benefit from skilled therapeutic intervention in order to improve the following deficits and impairments:  Pain, Postural dysfunction, Improper body mechanics, Decreased strength, Difficulty walking  Visit Diagnosis: Pain in right hip  Muscle weakness (generalized)     Problem List Patient Active Problem List   Diagnosis Date Noted  . Hypertriglyceridemia 04/04/2017  . Breast cyst, left 04/04/2017  . Aortic atherosclerosis ([place] 04/04/2017  . Anxiety and depression 03/08/2017  . Dysphagia 03/08/2017  . Hip pain-right 03/08/2017  . Low back pain 03/08/2017  . Vaginal discharge 03/08/2017  . Flank pain 10/18/2016  . Asthma 06/01/2016    MJoneen BoersPT, DPT   08/01/2017, 12:32 PM  [facility] [address] [place] [address]Phone: [phone]  Fax:  [phone] Name: Leah BrilliantMRN: [id number] of Birth: 1August 17, 1967

## 2017-08-03 ENCOUNTER — Ambulatory Visit: Payer: 59

## 2017-08-03 DIAGNOSIS — M6281 Muscle weakness (generalized): Secondary | ICD-10-CM

## 2017-08-03 DIAGNOSIS — M25551 Pain in right hip: Secondary | ICD-10-CM

## 2017-08-03 NOTE — Therapy (Signed)
[place] [facility] [address] [place], [address] Phone: [phone]   Fax:  [phone]  Physical Therapy Treatment  Patient Details  Name: Leah [name] MRN: [id number] Date of Birth: September 16, 1965 Referring Provider: Delano Metz, FNP   Encounter Date: 08/03/2017  PT End of Session - 08/03/17 1034    Visit Number  10    Number of Visits  23    Date for PT Re-Evaluation  09/07/17    PT Start Time  1034    PT Stop Time  1116    PT Time Calculation (min)  42 min    Activity Tolerance  Patient tolerated treatment well    Behavior During Therapy  [facility] for tasks assessed/performed       Past Medical History:  Diagnosis Date  . Anxiety   . Asthma   . Carpal tunnel syndrome    R hand   . Colon polyps   . Depression   . Eosinophilic esophagitis    noted in [place] with GI path report 09/29/14   . Esophageal stricture    s/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 Edwin Cap   . Herniated disc, cervical    s/p epidural injection  . Hyperlipidemia   . Low back pain   . Migraines     Past Surgical History:  Procedure Laterality Date  . CESAREAN SECTION    . COLONOSCOPY WITH PROPOFOL N/A 03/27/2017   Procedure: COLONOSCOPY WITH PROPOFOL;  Surgeon: Jonathon Bellows, MD;  Location: [facility] ENDOSCOPY;  Service: Gastroenterology;  Laterality: N/A;  . ESOPHAGOGASTRODUODENOSCOPY    . ESOPHAGOGASTRODUODENOSCOPY (EGD) WITH PROPOFOL N/A 03/27/2017   Procedure: ESOPHAGOGASTRODUODENOSCOPY (EGD) WITH PROPOFOL WITH DILATION;  Surgeon: Jonathon Bellows, MD;  Location: [facility] ENDOSCOPY;  Service: Gastroenterology;  Laterality: N/A;  . THROAT SURGERY  2015  . UMBILICAL HERNIA REPAIR  2006    x 2   w mesh per pt    There were no vitals filed for this visit.  Subjective Assessment - 08/03/17 1034    Subjective  R hip aches a little today. The hip was good after last session until yesterday during work. Did a lot  of ladder climbing yesterday.  4/10 R hip ache currently.     Pertinent History  R hip pain since 10 years ago. Pt was doing dishes. Pt turned to the R to place dishes from the sink to the dish drainer 6 inches to her R. Pt felt a pop in her back and her whole R LE went numb.  Had not had an X-ray at the time but was diagnosed with sciatica. Had PT then which helped. Was out of work for about a month due to pain.  Was diagnosed with a hernated disc in her neck about 4 years ago around C5. Had 2-3 MRIs in [place] for her neck. Neck has not bothered her so much but affects her R hand.  Over the past year pt lost 44 lbs purposely.  Had a kidney infection and had to stop exercising.  Also lost another 20 lbs due to stress related issues.  Pt works at [employer] and has to consistently climb ladders.  Climbing up ladders does not really bother her hip but going down does.  Pt also adds that her R hip pain radiates down to her knee (L5 dermatome area)    Patient Stated Goals  Walk up and down stairs without cringing.  Currently in Pain?  Yes    Pain Score  4     Pain Descriptors / Indicators  Aching    Pain Onset  More than a month ago                               PT Education - 08/03/17 1051    Education provided  Yes    Education Details  ther-ex    [employer]) Educated  Patient    Methods  Explanation;Demonstration;Tactile cues;Verbal cues    Comprehension  Returned demonstration;Verbalized understanding         Objectives  Med [employer] Access Code: [email or web address] No latex band allergies  Manual therapy   Inferior and lateralglide R hip with strap grade 3   Supine R hip flexion with inferior glide R hip with belt 10x3  Supine R hip IR with lateral glide R hip with belt 10x3  Decreased R hip ache after manual therapy     Therapeutic exercise    Supine bridge 10x with 5 second holds   Then with yellow band resisting hip abduction/ER  10x5 seconds for 2 sets  Walking lunges with yellow band around knees resisting hip abduction/ER 32 ft x 4  Forward step up onto and over Bosu with emphasis on femoral control 10x2 with one UE assist   L S/L R hip reverse clamshell 10x3  Prone R glute max extension 10x3  L S/L R hip abduction 10x2    Improved exercise technique, movement at target joints, use of target muscles after min to mod verbal, visual, tactile cues.   Continued working on manual therapy to help decrease R hip discomfort as well as glute med and max strengthening and femoral contorl to help decrease tension to R lateral hip. Decreased R hip ache to 2/10 after session.           PT Long Term Goals - 07/25/17 1149      PT LONG TERM GOAL #1   Title  Patient will have a decrease in R hip pain to 5/10 at worst to promote ability to climb ladders, negotiate stairs, and sleep on her side.     Baseline  10/10 R hip pain at most for for the past month (06/20/2017); 8/10 at most for the past 7 days (07/25/2017)    Time  6    Period  Weeks    Status  On-going    Target Date  09/07/17      PT LONG TERM GOAL #2   Title  Patient will improve her FOTO (hip) score by at least 10 points as a demonstration of improved function.     Baseline  50 (FOTO) 06/20/2017; 62 (07/25/2017)    Time  6    Period  Weeks    Status  Achieved    Target Date  09/07/17      PT LONG TERM GOAL #3   Title  Patient will improve R hip strength by at least 1/2 MMT grade to promote ability to negotiate stairs, climb ladders.     Time  6    Period  Weeks    Status  On-going    Target Date  09/07/17      PT LONG TERM GOAL #4   Title  Pt will report decreased difficulty climbing stairs and ladders to promote ability to perform work tasks.     Baseline  Pt states increased difficulty with  stair negotiation and ladder climbing due to pain (06/20/2017); Ladder and stair climbing are probably a little better per pt (07/25/2017)    Time  6     Period  Weeks    Status  On-going    Target Date  09/07/17      PT LONG TERM GOAL #5   Title  Patient will improve her LEFS by at least 9 points as a demonstration of improved function.     Baseline  55/80 (06/20/2017); 59/80 (07/25/2017)    Time  6    Period  Weeks    Status  Partially Met    Target Date  09/07/17            Plan - 08/03/17 1054    Clinical Impression Statement  Continued working on manual therapy to help decrease R hip discomfort as well as glute med and max strengthening and femoral contorl to help decrease tension to R lateral hip. Decreased R hip ache to 2/10 after session.     Rehab Potential  Fair    Clinical Impairments Affecting Rehab Potential  Chronicity of condition, pain, TTP, hip weakness    PT Frequency  2x / week    PT Duration  6 weeks    PT Treatment/Interventions  Manual techniques;Therapeutic exercise;Therapeutic activities;Dry needling;Aquatic Therapy;Electrical Stimulation;Iontophoresis 60m/ml Dexamethasone;Traction;Ultrasound;Gait training;Neuromuscular re-education;Patient/family education    PT Next Visit Plan  hip strengthening, lumbopelvic femoral control, trunk strengthening, manual techniques and modalities PRN    Consulted and Agree with Plan of Care  Patient       Patient will benefit from skilled therapeutic intervention in order to improve the following deficits and impairments:  Pain, Postural dysfunction, Improper body mechanics, Decreased strength, Difficulty walking  Visit Diagnosis: Pain in right hip  Muscle weakness (generalized)     Problem List Patient Active Problem List   Diagnosis Date Noted  . Hypertriglyceridemia 04/04/2017  . Breast cyst, left 04/04/2017  . Aortic atherosclerosis ([place] 04/04/2017  . Anxiety and depression 03/08/2017  . Dysphagia 03/08/2017  . Hip pain-right 03/08/2017  . Low back pain 03/08/2017  . Vaginal discharge 03/08/2017  . Flank pain 10/18/2016  . Asthma 06/01/2016    MJoneen BoersPT, DPT   08/03/2017, 12:16 PM  [facility] [address] [place] [address]Phone: [phone]  Fax:  [phone] Name: Leah SunigaMRN: [id number] of Birth: 1August 08, 1967

## 2017-08-08 ENCOUNTER — Ambulatory Visit: Payer: 59

## 2017-08-08 DIAGNOSIS — M25551 Pain in right hip: Secondary | ICD-10-CM

## 2017-08-08 DIAGNOSIS — M6281 Muscle weakness (generalized): Secondary | ICD-10-CM

## 2017-08-08 NOTE — Therapy (Signed)
[facility] [address] [place], [address] Phone: [phone]   Fax:  [phone]  Physical Therapy Treatment  Patient Details  Name: Leah [name] MRN: [id number] Date of Birth: [birth year] Referring Provider: Delano Metz, FNP   Encounter Date: 08/08/2017  PT End of Session - 08/08/17 1121    Visit Number  11    Number of Visits  23    Date for PT Re-Evaluation  09/07/17    PT Start Time  1121    PT Stop Time  1203    PT Time Calculation (min)  42 min    Activity Tolerance  Patient tolerated treatment well    Behavior During Therapy  [facility] for tasks assessed/performed       Past Medical History:  Diagnosis Date  . Anxiety   . Asthma   . Carpal tunnel syndrome    R hand   . Colon polyps   . Depression   . Eosinophilic esophagitis    noted in [place] with GI path report 09/29/14   . Esophageal stricture    s/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 Edwin Cap   . Herniated disc, cervical    s/p epidural injection  . Hyperlipidemia   . Low back pain   . Migraines     Past Surgical History:  Procedure Laterality Date  . CESAREAN SECTION    . COLONOSCOPY WITH PROPOFOL N/A 03/27/2017   Procedure: COLONOSCOPY WITH PROPOFOL;  Surgeon: Jonathon Bellows, MD;  Location: [facility] ENDOSCOPY;  Service: Gastroenterology;  Laterality: N/A;  . ESOPHAGOGASTRODUODENOSCOPY    . ESOPHAGOGASTRODUODENOSCOPY (EGD) WITH PROPOFOL N/A 03/27/2017   Procedure: ESOPHAGOGASTRODUODENOSCOPY (EGD) WITH PROPOFOL WITH DILATION;  Surgeon: Jonathon Bellows, MD;  Location: [facility] ENDOSCOPY;  Service: Gastroenterology;  Laterality: N/A;  . THROAT SURGERY  2015  . UMBILICAL HERNIA REPAIR  2006    x 2   w mesh per pt    There were no vitals filed for this visit.  Subjective Assessment - 08/08/17 1122    Subjective  R hip is not too bad today today and this weekend considering she walked 6 miles on Friday, and ran part  of the way. Also walked another 6 miles yesterday.  Went hiking. Running does not bother her R hip when going uphill. Walking uphill bothers her R hip.  2/10 R hip pain currently.     Pertinent History  R hip pain since 10 years ago. Pt was doing dishes. Pt turned to the R to place dishes from the sink to the dish drainer 6 inches to her R. Pt felt a pop in her back and her whole R LE went numb.  Had not had an X-ray at the time but was diagnosed with sciatica. Had PT then which helped. Was out of work for about a month due to pain.  Was diagnosed with a hernated disc in her neck about 4 years ago around C5. Had 2-3 MRIs in [place] for her neck. Neck has not bothered her so much but affects her R hand.  Over the past year pt lost 44 lbs purposely.  Had a kidney infection and had to stop exercising.  Also lost another 20 lbs due to stress related issues.  Pt works at [employer] and has to consistently climb ladders.  Climbing up ladders does not really bother her hip but going down does.  Pt also adds that her R hip pain  radiates down to her knee (L5 dermatome area)    Patient Stated Goals  Walk up and down stairs without cringing.     Currently in Pain?  Yes    Pain Score  2     Pain Onset  More than a month ago                               PT Education - 08/08/17 1127    Education provided  Yes    Education Details  ther-ex    [employer]) Educated  Patient    Methods  Explanation;Demonstration;Tactile cues;Verbal cues    Comprehension  Returned demonstration;Verbalized understanding         Objectives  Med Bridge Access Code: [email or web address] No latex band allergies    Therapeutic exercise    Supine bridge 10x with 5 second holds              Then with yellow band resisting hip abduction/ER 10x5 seconds   Then with red band resisting hip abduction/ER 10x5 seconds   Supine bridge with march 10x2 each LE, emphasis on level pelvis  L pelvis drops  with L hip flexion during march  L S/L R hip abduction 10x2  L S/L R hip reverse clamshell 10x3  Prone R glute max extension 10x3  Forward step up onto and over Bosu with emphasis on femoral control 10x, then 15x without UE assist   Lateral step up onto bosu with R LE 10x. R hip ache which decreases with rest.    Walking lunges with red band around knees resisting hip abduction/ER 32 ft x 4  Single leg squats on total gym R LE height 18 for 10x, emphasis on femoral control  Then height 17 for 10x  Then height 16.5 for 10x  Seated hip adductor small physioball and glute max squeeze 10x10 seconds.   Improved exercise technique, movement at target joints, use of target muscles after min to mod verbal, visual, tactile cues.   Good carry over of decreased R lateral hip pain based on subjective reports with pt being able to hike and her R hip not bothering her as much. Continued working on isometric and concentric glute med muscle activation as well as glute max strengthening and femoral control to help decrease tension to lateral hip. No R hip pain after session.             PT Long Term Goals - 07/25/17 1149      PT LONG TERM GOAL #1   Title  Patient will have a decrease in R hip pain to 5/10 at worst to promote ability to climb ladders, negotiate stairs, and sleep on her side.     Baseline  10/10 R hip pain at most for for the past month (06/20/2017); 8/10 at most for the past 7 days (07/25/2017)    Time  6    Period  Weeks    Status  On-going    Target Date  09/07/17      PT LONG TERM GOAL #2   Title  Patient will improve her FOTO (hip) score by at least 10 points as a demonstration of improved function.     Baseline  50 (FOTO) 06/20/2017; 62 (07/25/2017)    Time  6    Period  Weeks    Status  Achieved    Target Date  09/07/17      PT LONG TERM  GOAL #3   Title  Patient will improve R hip strength by at least 1/2 MMT grade to promote ability to negotiate stairs,  climb ladders.     Time  6    Period  Weeks    Status  On-going    Target Date  09/07/17      PT LONG TERM GOAL #4   Title  Pt will report decreased difficulty climbing stairs and ladders to promote ability to perform work tasks.     Baseline  Pt states increased difficulty with stair negotiation and ladder climbing due to pain (06/20/2017); Ladder and stair climbing are probably a little better per pt (07/25/2017)    Time  6    Period  Weeks    Status  On-going    Target Date  09/07/17      PT LONG TERM GOAL #5   Title  Patient will improve her LEFS by at least 9 points as a demonstration of improved function.     Baseline  55/80 (06/20/2017); 59/80 (07/25/2017)    Time  6    Period  Weeks    Status  Partially Met    Target Date  09/07/17            Plan - 08/08/17 1127    Clinical Impression Statement  Good carry over of decreased R lateral hip pain based on subjective reports with pt being able to hike and her R hip not bothering her as much. Continued working on isometric and concentric glute med muscle activation as well as glute max strengthening and femoral control to help decrease tension to lateral hip. No R hip pain after session.     Rehab Potential  Fair    Clinical Impairments Affecting Rehab Potential  Chronicity of condition, pain, TTP, hip weakness    PT Frequency  2x / week    PT Duration  6 weeks    PT Treatment/Interventions  Manual techniques;Therapeutic exercise;Therapeutic activities;Dry needling;Aquatic Therapy;Electrical Stimulation;Iontophoresis 45m/ml Dexamethasone;Traction;Ultrasound;Gait training;Neuromuscular re-education;Patient/family education    PT Next Visit Plan  hip strengthening, lumbopelvic femoral control, trunk strengthening, manual techniques and modalities PRN    Consulted and Agree with Plan of Care  Patient       Patient will benefit from skilled therapeutic intervention in order to improve the following deficits and impairments:  Pain,  Postural dysfunction, Improper body mechanics, Decreased strength, Difficulty walking  Visit Diagnosis: Pain in right hip  Muscle weakness (generalized)     Problem List Patient Active Problem List   Diagnosis Date Noted  . Hypertriglyceridemia 04/04/2017  . Breast cyst, left 04/04/2017  . Aortic atherosclerosis ([place] 04/04/2017  . Anxiety and depression 03/08/2017  . Dysphagia 03/08/2017  . Hip pain-right 03/08/2017  . Low back pain 03/08/2017  . Vaginal discharge 03/08/2017  . Flank pain 10/18/2016  . Asthma 06/01/2016    MJoneen BoersPT, DPT   08/08/2017, 12:12 PM  [facility] [address] [place] [address]Phone: [phone]  Fax:  [phone] Name: KLorenzo PereyraMRN: [id number] of Birth: 126-May-1967

## 2017-08-09 ENCOUNTER — Ambulatory Visit (INDEPENDENT_AMBULATORY_CARE_PROVIDER_SITE_OTHER): Payer: 59 | Admitting: Psychology

## 2017-08-09 DIAGNOSIS — F329 Major depressive disorder, single episode, unspecified: Secondary | ICD-10-CM | POA: Diagnosis not present

## 2017-08-09 DIAGNOSIS — F419 Anxiety disorder, unspecified: Secondary | ICD-10-CM

## 2017-08-10 ENCOUNTER — Ambulatory Visit: Payer: 59

## 2017-08-10 DIAGNOSIS — M25551 Pain in right hip: Secondary | ICD-10-CM

## 2017-08-10 DIAGNOSIS — M6281 Muscle weakness (generalized): Secondary | ICD-10-CM

## 2017-08-10 NOTE — Therapy (Signed)
[facility] [address] [place], [address] Phone: [phone]   Fax:  [phone]  Physical Therapy Treatment  Patient Details  Name: Leah [name] MRN: [id number] Date of Birth: 25-Nov-1965 Referring Provider: Delano Metz, FNP   Encounter Date: 08/10/2017  PT End of Session - 08/10/17 1035    Visit Number  12    Number of Visits  23    Date for PT Re-Evaluation  09/07/17    PT Start Time  5597    PT Stop Time  1115    PT Time Calculation (min)  40 min    Activity Tolerance  Patient tolerated treatment well    Behavior During Therapy  [facility] for tasks assessed/performed       Past Medical History:  Diagnosis Date  . Anxiety   . Asthma   . Carpal tunnel syndrome    R hand   . Colon polyps   . Depression   . Eosinophilic esophagitis    noted in [place] with GI path report 09/29/14   . Esophageal stricture    s/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 Edwin Cap   . Herniated disc, cervical    s/p epidural injection  . Hyperlipidemia   . Low back pain   . Migraines     Past Surgical History:  Procedure Laterality Date  . CESAREAN SECTION    . COLONOSCOPY WITH PROPOFOL N/A 03/27/2017   Procedure: COLONOSCOPY WITH PROPOFOL;  Surgeon: Jonathon Bellows, MD;  Location: [facility] ENDOSCOPY;  Service: Gastroenterology;  Laterality: N/A;  . ESOPHAGOGASTRODUODENOSCOPY    . ESOPHAGOGASTRODUODENOSCOPY (EGD) WITH PROPOFOL N/A 03/27/2017   Procedure: ESOPHAGOGASTRODUODENOSCOPY (EGD) WITH PROPOFOL WITH DILATION;  Surgeon: Jonathon Bellows, MD;  Location: [facility] ENDOSCOPY;  Service: Gastroenterology;  Laterality: N/A;  . THROAT SURGERY  2015  . UMBILICAL HERNIA REPAIR  2006    x 2   w mesh per pt    There were no vitals filed for this visit.  Subjective Assessment - 08/10/17 1036    Subjective  R hip is alright, about a 2/10 currently. Was ok after last session. Went on another hike yesterday at a  different place which had more rocks and roots to navigate.  The stairs and ladders at work are not bad actually.     Pertinent History  R hip pain since 10 years ago. Pt was doing dishes. Pt turned to the R to place dishes from the sink to the dish drainer 6 inches to her R. Pt felt a pop in her back and her whole R LE went numb.  Had not had an X-ray at the time but was diagnosed with sciatica. Had PT then which helped. Was out of work for about a month due to pain.  Was diagnosed with a hernated disc in her neck about 4 years ago around C5. Had 2-3 MRIs in [place] for her neck. Neck has not bothered her so much but affects her R hand.  Over the past year pt lost 44 lbs purposely.  Had a kidney infection and had to stop exercising.  Also lost another 20 lbs due to stress related issues.  Pt works at [employer] and has to consistently climb ladders.  Climbing up ladders does not really bother her hip but going down does.  Pt also adds that her R hip pain radiates down to her knee (L5 dermatome area)    Patient Stated Goals  Walk up and down stairs without cringing.     Currently in Pain?  Yes    Pain Score  2     Pain Onset  More than a month ago                               PT Education - 08/10/17 1041    Education provided  Yes    Education Details  ther-ex    [employer]) Educated  Patient    Methods  Explanation;Demonstration;Tactile cues;Verbal cues    Comprehension  Returned demonstration;Verbalized understanding         Objectives  Med Bridge Access Code: [email or web address] No latex band allergies    Therapeutic exercise    Supine bridge with red band resisting hip abduction/ER 10x5 seconds for 3 sets  Supine bridge with march 10x3 each LE, emphasis on level pelvis          Improved pelvic control. (upgraded to 3 sets)  L S/L R hip abduction 10x3 (upgraded to 3 sets)   L S/L R hip reverse clamshell 10x3  Prone R glute max extension  10x 5 seconds holds for 2 sets   Upside down bosu squats with red band resisting hip abduction/ER and bilateral UE assist  for 10x2  Forward step up onto Dyna disc with R LE, red band resisting hip abduction/ER with light touch assist 10x2  Lateral step up onto Dyna disc with red band resisting hip abduction/ER 10x3  Standing hip machine height 1  R hip extension plate 40 for [id number]    Single leg squats on total gym R LE height 16.5 for 10x, emphasis on femoral control          then 7x  Seated hip adductor small physioball and glute max squeeze 10x10 seconds.    Improved exercise technique, movement at target joints, use of target muscles after min to mod verbal, visual, tactile cues.    Continued working on concentric glute med and max strengthening to promote ability to tolerate load for R lateral hip. Pt seems to be improving tolerance with stairs and ladders at work based on subjective reports. Pt tolerated session well without aggravation of symptoms.              PT Long Term Goals - 07/25/17 1149      PT LONG TERM GOAL #1   Title  Patient will have a decrease in R hip pain to 5/10 at worst to promote ability to climb ladders, negotiate stairs, and sleep on her side.     Baseline  10/10 R hip pain at most for for the past month (06/20/2017); 8/10 at most for the past 7 days (07/25/2017)    Time  6    Period  Weeks    Status  On-going    Target Date  09/07/17      PT LONG TERM GOAL #2   Title  Patient will improve her FOTO (hip) score by at least 10 points as a demonstration of improved function.     Baseline  50 (FOTO) 06/20/2017; 62 (07/25/2017)    Time  6    Period  Weeks    Status  Achieved    Target Date  09/07/17      PT LONG TERM GOAL #3   Title  Patient will improve R hip strength by at least 1/2 MMT grade to promote ability to negotiate stairs, climb ladders.  Time  6    Period  Weeks    Status  On-going    Target Date  09/07/17      PT LONG TERM  GOAL #4   Title  Pt will report decreased difficulty climbing stairs and ladders to promote ability to perform work tasks.     Baseline  Pt states increased difficulty with stair negotiation and ladder climbing due to pain (06/20/2017); Ladder and stair climbing are probably a little better per pt (07/25/2017)    Time  6    Period  Weeks    Status  On-going    Target Date  09/07/17      PT LONG TERM GOAL #5   Title  Patient will improve her LEFS by at least 9 points as a demonstration of improved function.     Baseline  55/80 (06/20/2017); 59/80 (07/25/2017)    Time  6    Period  Weeks    Status  Partially Met    Target Date  09/07/17            Plan - 08/10/17 1041    Clinical Impression Statement  Continued working on concentric glute med and max strengthening to promote ability to tolerate load for R lateral hip. Pt seems to be improving tolerance with stairs and ladders at work based on subjective reports. Pt tolerated session well without aggravation of symptoms.     Rehab Potential  Fair    Clinical Impairments Affecting Rehab Potential  Chronicity of condition, pain, TTP, hip weakness    PT Frequency  2x / week    PT Duration  6 weeks    PT Treatment/Interventions  Manual techniques;Therapeutic exercise;Therapeutic activities;Dry needling;Aquatic Therapy;Electrical Stimulation;Iontophoresis 67m/ml Dexamethasone;Traction;Ultrasound;Gait training;Neuromuscular re-education;Patient/family education    PT Next Visit Plan  hip strengthening, lumbopelvic femoral control, trunk strengthening, manual techniques and modalities PRN    Consulted and Agree with Plan of Care  Patient       Patient will benefit from skilled therapeutic intervention in order to improve the following deficits and impairments:  Pain, Postural dysfunction, Improper body mechanics, Decreased strength, Difficulty walking  Visit Diagnosis: Pain in right hip  Muscle weakness (generalized)     Problem  List Patient Active Problem List   Diagnosis Date Noted  . Hypertriglyceridemia 04/04/2017  . Breast cyst, left 04/04/2017  . Aortic atherosclerosis ([place] 04/04/2017  . Anxiety and depression 03/08/2017  . Dysphagia 03/08/2017  . Hip pain-right 03/08/2017  . Low back pain 03/08/2017  . Vaginal discharge 03/08/2017  . Flank pain 10/18/2016  . Asthma 06/01/2016    MJoneen BoersPT, DPT   08/10/2017, 11:21 AM  [facility] [address] [place] [address]Phone: [phone]  Fax:  [phone] Name: Leah GarayMRN: [id number] of Birth: 1Mar 10, 1967

## 2017-08-15 ENCOUNTER — Ambulatory Visit: Payer: 59 | Attending: Family Medicine

## 2017-08-15 DIAGNOSIS — M6281 Muscle weakness (generalized): Secondary | ICD-10-CM | POA: Diagnosis present

## 2017-08-15 DIAGNOSIS — M25551 Pain in right hip: Secondary | ICD-10-CM | POA: Insufficient documentation

## 2017-08-15 NOTE — Therapy (Signed)
[facility] [address], [place], [address] Phone: [phone]   Fax:  [phone]  Physical Therapy Treatment  Patient Details  Name: Leah [name] MRN: [id number] Date of Birth: 1965-09-10 Referring Provider: Delano Metz, FNP   Encounter Date: 08/15/2017  PT End of Session - 08/15/17 1119    Visit Number  13    Number of Visits  23    Date for PT Re-Evaluation  09/07/17    PT Start Time  1119    PT Stop Time  1159    PT Time Calculation (min)  40 min    Activity Tolerance  Patient tolerated treatment well    Behavior During Therapy  [facility] for tasks assessed/performed       Past Medical History:  Diagnosis Date  . Anxiety   . Asthma   . Carpal tunnel syndrome    R hand   . Colon polyps   . Depression   . Eosinophilic esophagitis    noted in [place] with GI path report 09/29/14   . Esophageal stricture    s/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 Edwin Cap   . Herniated disc, cervical    s/p epidural injection  . Hyperlipidemia   . Low back pain   . Migraines     Past Surgical History:  Procedure Laterality Date  . CESAREAN SECTION    . COLONOSCOPY WITH PROPOFOL N/A 03/27/2017   Procedure: COLONOSCOPY WITH PROPOFOL;  Surgeon: Jonathon Bellows, MD;  Location: [facility] ENDOSCOPY;  Service: Gastroenterology;  Laterality: N/A;  . ESOPHAGOGASTRODUODENOSCOPY    . ESOPHAGOGASTRODUODENOSCOPY (EGD) WITH PROPOFOL N/A 03/27/2017   Procedure: ESOPHAGOGASTRODUODENOSCOPY (EGD) WITH PROPOFOL WITH DILATION;  Surgeon: Jonathon Bellows, MD;  Location: [facility] ENDOSCOPY;  Service: Gastroenterology;  Laterality: N/A;  . THROAT SURGERY  2015  . UMBILICAL HERNIA REPAIR  2006    x 2   w mesh per pt    There were no vitals filed for this visit.  Subjective Assessment - 08/15/17 1120    Subjective  R hip is not bad. No pain currently. Not too bad since last session. Woke up with anterior hip pain  (pinching) but it went away.  Did a lot of driving during the weekend and her R hip did not bother her.  Stairs and ladders after last session were not bad.  Lately has not been going up and down stairs and ladders as much.   3/10 R hip pain at most for the last 3 days.     Pertinent History  R hip pain since 10 years ago. Pt was doing dishes. Pt turned to the R to place dishes from the sink to the dish drainer 6 inches to her R. Pt felt a pop in her back and her whole R LE went numb.  Had not had an X-ray at the time but was diagnosed with sciatica. Had PT then which helped. Was out of work for about a month due to pain.  Was diagnosed with a hernated disc in her neck about 4 years ago around C5. Had 2-3 MRIs in [place] for her neck. Neck has not bothered her so much but affects her R hand.  Over the past year pt lost 44 lbs purposely.  Had a kidney infection and had to stop exercising.  Also lost another 20 lbs due to stress related issues.  Pt works at [employer] and has to consistently climb  ladders.  Climbing up ladders does not really bother her hip but going down does.  Pt also adds that her R hip pain radiates down to her knee (L5 dermatome area)    Patient Stated Goals  Walk up and down stairs without cringing.     Currently in Pain?  No/denies    Pain Score  0-No pain    Pain Onset  More than a month ago                               PT Education - 08/15/17 1125    Education provided  Yes    Education Details  ther-ex    [employer]) Educated  Patient    Methods  Explanation;Demonstration;Tactile cues;Verbal cues    Comprehension  Returned demonstration;Verbalized understanding        Objectives   Med Bridge Access Code: [email or web address] No latex band allergies  No TTP R lateral hip/greater trochanter and glute med/min area.   Therapeutic exercise  Supine bridge with march 10x3 each LE, emphasis on level pelvis        Supine bridge  with red band resisting hip abduction/ER 10x5 secondsfor 3 sets  Prone R glute max extension 10x 5 seconds holds for 2 sets  L S/L R hip abduction 10x3   L S/L R hip reverse clamshell 10x3   Standing hip machine height 1             R hip extension plate 40 for [id number]   Upgrade weight next visit if appropriate  Upside down bosu squats with red band resisting hip abduction/ER and bilateral UE assist  for 10x2  SLS on Air Ex pad   With L hip abduction with red band around distal thighs 10x3  Standing R LE leg press resisting double blue band with bilateral UE assist 10x3  Standing on R LE: running man with L UE assist 10x for glute muscle use. R patella discomfort which eases with sitting rest.   Seated hip adductor small physioball and glute max squeeze 10x10 seconds for 2 sets  Improved exercise technique, movement at target joints, use of target muscles after min to mod verbal, visual, tactile cues.     Pt seems to be making progress with decreased R hip pain with stair and ladder negotiation based on pt subjective reports as well as 3/10 at most for the past 7 days. No TTP R lateral hip. Continued working on glute med and max strengthening and applying proper stress to R glute med to promote healing. Pt tolerated session well without aggravation of symptoms.       PT Long Term Goals - 07/25/17 1149      PT LONG TERM GOAL #1   Title  Patient will have a decrease in R hip pain to 5/10 at worst to promote ability to climb ladders, negotiate stairs, and sleep on her side.     Baseline  10/10 R hip pain at most for for the past month (06/20/2017); 8/10 at most for the past 7 days (07/25/2017)    Time  6    Period  Weeks    Status  On-going    Target Date  09/07/17      PT LONG TERM GOAL #2   Title  Patient will improve her FOTO (hip) score by at least 10 points as a demonstration of improved function.     Baseline  50 (FOTO)  06/20/2017; 62 (07/25/2017)    Time  6     Period  Weeks    Status  Achieved    Target Date  09/07/17      PT LONG TERM GOAL #3   Title  Patient will improve R hip strength by at least 1/2 MMT grade to promote ability to negotiate stairs, climb ladders.     Time  6    Period  Weeks    Status  On-going    Target Date  09/07/17      PT LONG TERM GOAL #4   Title  Pt will report decreased difficulty climbing stairs and ladders to promote ability to perform work tasks.     Baseline  Pt states increased difficulty with stair negotiation and ladder climbing due to pain (06/20/2017); Ladder and stair climbing are probably a little better per pt (07/25/2017)    Time  6    Period  Weeks    Status  On-going    Target Date  09/07/17      PT LONG TERM GOAL #5   Title  Patient will improve her LEFS by at least 9 points as a demonstration of improved function.     Baseline  55/80 (06/20/2017); 59/80 (07/25/2017)    Time  6    Period  Weeks    Status  Partially Met    Target Date  09/07/17            Plan - 08/15/17 1126    Clinical Impression Statement  Pt seems to be making progress with decreased R hip pain with stair and ladder negotiation based on pt subjective reports as well as 3/10 at most for the past 7 days. No TTP R lateral hip. Continued working on glute med and max strengthening and applying proper stress to R glute med to promote healing. Pt tolerated session well without aggravation of symptoms.     Rehab Potential  Fair    Clinical Impairments Affecting Rehab Potential  Chronicity of condition, pain, TTP, hip weakness    PT Frequency  2x / week    PT Duration  6 weeks    PT Treatment/Interventions  Manual techniques;Therapeutic exercise;Therapeutic activities;Dry needling;Aquatic Therapy;Electrical Stimulation;Iontophoresis 41m/ml Dexamethasone;Traction;Ultrasound;Gait training;Neuromuscular re-education;Patient/family education    PT Next Visit Plan  hip strengthening, lumbopelvic femoral control, trunk strengthening,  manual techniques and modalities PRN    Consulted and Agree with Plan of Care  Patient       Patient will benefit from skilled therapeutic intervention in order to improve the following deficits and impairments:  Pain, Postural dysfunction, Improper body mechanics, Decreased strength, Difficulty walking  Visit Diagnosis: Pain in right hip  Muscle weakness (generalized)     Problem List Patient Active Problem List   Diagnosis Date Noted  . Hypertriglyceridemia 04/04/2017  . Breast cyst, left 04/04/2017  . Aortic atherosclerosis [place] 04/04/2017  . Anxiety and depression 03/08/2017  . Dysphagia 03/08/2017  . Hip pain-right 03/08/2017  . Low back pain 03/08/2017  . Vaginal discharge 03/08/2017  . Flank pain 10/18/2016  . Asthma 06/01/2016   MJoneen BoersPT, DPT   08/15/2017, 12:12 PM  [facility] [address] [place] [address]Phone: [phone]  Fax:  [phone] Name: Leah SabanMRN: [id number] of Birth: [birth year]

## 2017-08-17 ENCOUNTER — Ambulatory Visit: Payer: 59

## 2017-08-17 DIAGNOSIS — M25551 Pain in right hip: Secondary | ICD-10-CM | POA: Diagnosis not present

## 2017-08-17 DIAGNOSIS — M6281 Muscle weakness (generalized): Secondary | ICD-10-CM

## 2017-08-17 NOTE — Therapy (Signed)
[facility] [address] [place], [address] Phone: [phone]   Fax:  [phone]  Physical Therapy Treatment  Patient Details  Name: Leah [name] MRN: [id number] Date of Birth: [birth year] Referring Provider: Delano Metz, FNP   Encounter Date: 08/17/2017  PT End of Session - 08/17/17 1119    Visit Number  14    Number of Visits  23    Date for PT Re-Evaluation  09/07/17    PT Start Time  1119    PT Stop Time  1202    PT Time Calculation (min)  43 min    Activity Tolerance  Patient tolerated treatment well    Behavior During Therapy  [facility] for tasks assessed/performed       Past Medical History:  Diagnosis Date  . Anxiety   . Asthma   . Carpal tunnel syndrome    R hand   . Colon polyps   . Depression   . Eosinophilic esophagitis    noted in [place] with GI path report 09/29/14   . Esophageal stricture    s/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 Edwin Cap   . Herniated disc, cervical    s/p epidural injection  . Hyperlipidemia   . Low back pain   . Migraines     Past Surgical History:  Procedure Laterality Date  . CESAREAN SECTION    . COLONOSCOPY WITH PROPOFOL N/A 03/27/2017   Procedure: COLONOSCOPY WITH PROPOFOL;  Surgeon: Jonathon Bellows, MD;  Location: [facility] ENDOSCOPY;  Service: Gastroenterology;  Laterality: N/A;  . ESOPHAGOGASTRODUODENOSCOPY    . ESOPHAGOGASTRODUODENOSCOPY (EGD) WITH PROPOFOL N/A 03/27/2017   Procedure: ESOPHAGOGASTRODUODENOSCOPY (EGD) WITH PROPOFOL WITH DILATION;  Surgeon: Jonathon Bellows, MD;  Location: [facility] ENDOSCOPY;  Service: Gastroenterology;  Laterality: N/A;  . THROAT SURGERY  2015  . UMBILICAL HERNIA REPAIR  2006    x 2   w mesh per pt    There were no vitals filed for this visit.  Subjective Assessment - 08/17/17 1120    Subjective  R hip doing good. Woke up with her R knee hurting. 1/10 if that R hip pain currently, 6/10 R lateral knee  pain currently. Took tylenol. Felt R knee pain when she woke up this morning.  Did not really go up and down stairs and ladders since last session. Did a lot of walking.     Pertinent History  R hip pain since 10 years ago. Pt was doing dishes. Pt turned to the R to place dishes from the sink to the dish drainer 6 inches to her R. Pt felt a pop in her back and her whole R LE went numb.  Had not had an X-ray at the time but was diagnosed with sciatica. Had PT then which helped. Was out of work for about a month due to pain.  Was diagnosed with a hernated disc in her neck about 4 years ago around C5. Had 2-3 MRIs in [place] for her neck. Neck has not bothered her so much but affects her R hand.  Over the past year pt lost 44 lbs purposely.  Had a kidney infection and had to stop exercising.  Also lost another 20 lbs due to stress related issues.  Pt works at [employer] and has to consistently climb ladders.  Climbing up ladders does not really bother her hip but going down does.  Pt also adds that her R hip pain  radiates down to her knee (L5 dermatome area)    Patient Stated Goals  Walk up and down stairs without cringing.     Currently in Pain?  Yes    Pain Score  6     Pain Location  Knee    Pain Orientation  Right    Pain Onset  More than a month ago                               PT Education - 08/17/17 1213    Education provided  Yes    Education Details  ther-ex    [employer]) Educated  Patient    Methods  Explanation;Demonstration;Tactile cues;Verbal cues    Comprehension  Returned demonstration;Verbalized understanding         Objectives   Med Bridge Access Code: [email or web address] No latex band allergies   Manual therapy  Seated STM R vastus lateralis to decrease tension to patella Decreased knee pain to 4/10   Supine R IR tibia, ER of femur grade 3-  Decreased knee pain    Worked on decreasing R knee pain to promote ability to perform  exercises for R hip    Therapeutic exercise  SLS R LE 10x5 seconds for 2 sets to promote isometric glute med muscle contraction   Standing L hip abduction resisting yellow band around ankle with R LE on Air Ex pad to promote closed chain glute med muscle use R LE. 10x3   Standing L hip extension resisting yellow band around ankle with R LE on Air Ex pad to promote closed chain R hip strengthening   Standing R LE leg press resisting double blue band with bilateral UE assist 10x3  L S/L R hip abduction 10x, then 10x5 seconds   L S/L R hip reverse clamshell 10x3  Prone R glute max extension 10x5 seconds holds for 2 sets  Standing hip machine height 1 R hip extension plate 55 for [id number]   Then plate 70 for [id number]      Improved exercise technique, movement at target joints, use of target muscles aftermin tomod verbal, visual, tactile cues.   Decreased R knee pain with glute med and max strengthening exercises as well as with manual therapy to decrease vastus lateralis muscle tension. Worked on decreasing R knee pain to promote ability to perform exercises for R hip. Continued working on concentric and isometric glute med and max strengthening on R to promote appropriate stress to tissues for proper healing. Pt seems to be making progress with hip pain based on subjective reports. Pt tolerated session well without aggravation of R hip symptoms.       PT Long Term Goals - 07/25/17 1149      PT LONG TERM GOAL #1   Title  Patient will have a decrease in R hip pain to 5/10 at worst to promote ability to climb ladders, negotiate stairs, and sleep on her side.     Baseline  10/10 R hip pain at most for for the past month (06/20/2017); 8/10 at most for the past 7 days (07/25/2017)    Time  6    Period  Weeks    Status  On-going    Target Date  09/07/17      PT LONG TERM GOAL #2   Title  Patient will improve her FOTO (hip) score by at least 10 points as a demonstration of  improved function.  Baseline  50 (FOTO) 06/20/2017; 62 (07/25/2017)    Time  6    Period  Weeks    Status  Achieved    Target Date  09/07/17      PT LONG TERM GOAL #3   Title  Patient will improve R hip strength by at least 1/2 MMT grade to promote ability to negotiate stairs, climb ladders.     Time  6    Period  Weeks    Status  On-going    Target Date  09/07/17      PT LONG TERM GOAL #4   Title  Pt will report decreased difficulty climbing stairs and ladders to promote ability to perform work tasks.     Baseline  Pt states increased difficulty with stair negotiation and ladder climbing due to pain (06/20/2017); Ladder and stair climbing are probably a little better per pt (07/25/2017)    Time  6    Period  Weeks    Status  On-going    Target Date  09/07/17      PT LONG TERM GOAL #5   Title  Patient will improve her LEFS by at least 9 points as a demonstration of improved function.     Baseline  55/80 (06/20/2017); 59/80 (07/25/2017)    Time  6    Period  Weeks    Status  Partially Met    Target Date  09/07/17            Plan - 08/17/17 1119    Clinical Impression Statement  Decreased R knee pain with glute med and max strengthening exercises as well as with manual therapy to decrease vastus lateralis muscle tension. Worked on decreasing R knee pain to promote ability to perform exercises for R hip. Continued working on concentric and isometric glute med and max strengthening on R to promote appropriate stress to tissues for proper healing. Pt seems to be making progress with hip pain based on subjective reports. Pt tolerated session well without aggravation of R hip symptoms.     Rehab Potential  Fair    Clinical Impairments Affecting Rehab Potential  Chronicity of condition, pain, TTP, hip weakness    PT Frequency  2x / week    PT Duration  6 weeks    PT Treatment/Interventions  Manual techniques;Therapeutic exercise;Therapeutic activities;Dry needling;Aquatic  Therapy;Electrical Stimulation;Iontophoresis 68m/ml Dexamethasone;Traction;Ultrasound;Gait training;Neuromuscular re-education;Patient/family education    PT Next Visit Plan  hip strengthening, lumbopelvic femoral control, trunk strengthening, manual techniques and modalities PRN    Consulted and Agree with Plan of Care  Patient       Patient will benefit from skilled therapeutic intervention in order to improve the following deficits and impairments:  Pain, Postural dysfunction, Improper body mechanics, Decreased strength, Difficulty walking  Visit Diagnosis: Pain in right hip  Muscle weakness (generalized)     Problem List Patient Active Problem List   Diagnosis Date Noted  . Hypertriglyceridemia 04/04/2017  . Breast cyst, left 04/04/2017  . Aortic atherosclerosis ([place] 04/04/2017  . Anxiety and depression 03/08/2017  . Dysphagia 03/08/2017  . Hip pain-right 03/08/2017  . Low back pain 03/08/2017  . Vaginal discharge 03/08/2017  . Flank pain 10/18/2016  . Asthma 06/01/2016    MJoneen BoersPT, DPT   08/17/2017, 12:18 PM  [facility] [address] [place] [address]Phone: [phone]  Fax:  [phone] Name: KMylia PondexterMRN: [id number] of Birth: 129-Jan-1967

## 2017-08-18 ENCOUNTER — Other Ambulatory Visit: Payer: Self-pay | Admitting: Internal Medicine

## 2017-08-18 DIAGNOSIS — M545 Low back pain: Principal | ICD-10-CM

## 2017-08-18 DIAGNOSIS — G8929 Other chronic pain: Secondary | ICD-10-CM

## 2017-08-18 MED ORDER — IBUPROFEN 800 MG PO TABS: 800.0000 mg | ORAL_TABLET | Freq: Three times a day (TID) | ORAL | 0 refills | Status: DC | PRN

## 2017-08-22 ENCOUNTER — Ambulatory Visit: Payer: 59

## 2017-08-22 DIAGNOSIS — M25551 Pain in right hip: Secondary | ICD-10-CM | POA: Diagnosis not present

## 2017-08-22 DIAGNOSIS — M6281 Muscle weakness (generalized): Secondary | ICD-10-CM

## 2017-08-22 NOTE — Therapy (Signed)
[facility] [address] [place], [address] Phone: [phone]   Fax:  [phone]  Physical Therapy Treatment  Patient Details  Name: Leah [name] MRN: [id number] Date of Birth: Aug 31, 1965 Referring Provider: Delano Metz, FNP   Encounter Date: 08/22/2017  PT End of Session - 08/22/17 1125    Visit Number  15    Number of Visits  23    Date for PT Re-Evaluation  09/07/17    PT Start Time  1125    PT Stop Time  1209    PT Time Calculation (min)  44 min    Activity Tolerance  Patient tolerated treatment well    Behavior During Therapy  [facility] for tasks assessed/performed       Past Medical History:  Diagnosis Date  . Anxiety   . Asthma   . Carpal tunnel syndrome    R hand   . Colon polyps   . Depression   . Eosinophilic esophagitis    noted in [place] with GI path report 09/29/14   . Esophageal stricture    s/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 Edwin Cap   . Herniated disc, cervical    s/p epidural injection  . Hyperlipidemia   . Low back pain   . Migraines     Past Surgical History:  Procedure Laterality Date  . CESAREAN SECTION    . COLONOSCOPY WITH PROPOFOL N/A 03/27/2017   Procedure: COLONOSCOPY WITH PROPOFOL;  Surgeon: Jonathon Bellows, MD;  Location: [facility] ENDOSCOPY;  Service: Gastroenterology;  Laterality: N/A;  . ESOPHAGOGASTRODUODENOSCOPY    . ESOPHAGOGASTRODUODENOSCOPY (EGD) WITH PROPOFOL N/A 03/27/2017   Procedure: ESOPHAGOGASTRODUODENOSCOPY (EGD) WITH PROPOFOL WITH DILATION;  Surgeon: Jonathon Bellows, MD;  Location: [facility] ENDOSCOPY;  Service: Gastroenterology;  Laterality: N/A;  . THROAT SURGERY  2015  . UMBILICAL HERNIA REPAIR  2006    x 2   w mesh per pt    There were no vitals filed for this visit.  Subjective Assessment - 08/22/17 1125    Subjective  R hip is ok. No pain or discomfort currently. Stairs and ladders are overall better. However when she  does them after walking for a while, L hip gets a little stiff.  Used to be pain with stiffness, now its just stiff.  L knee has felt really good since last session.     Pertinent History  R hip pain since 10 years ago. Pt was doing dishes. Pt turned to the R to place dishes from the sink to the dish drainer 6 inches to her R. Pt felt a pop in her back and her whole R LE went numb.  Had not had an X-ray at the time but was diagnosed with sciatica. Had PT then which helped. Was out of work for about a month due to pain.  Was diagnosed with a hernated disc in her neck about 4 years ago around C5. Had 2-3 MRIs in [place] for her neck. Neck has not bothered her so much but affects her R hand.  Over the past year pt lost 44 lbs purposely.  Had a kidney infection and had to stop exercising.  Also lost another 20 lbs due to stress related issues.  Pt works at [employer] and has to consistently climb ladders.  Climbing up ladders does not really bother her hip but going down does.  Pt also adds that her R hip pain radiates down to  her knee (L5 dermatome area)    Patient Stated Goals  Walk up and down stairs without cringing.     Currently in Pain?  No/denies    Pain Score  0-No pain    Pain Onset  More than a month ago                               PT Education - 08/22/17 1146    Education provided  Yes    Education Details  ther-ex    [employer]) Educated  Patient    Methods  Explanation;Demonstration;Tactile cues;Verbal cues    Comprehension  Returned demonstration;Verbalized understanding         Objectives   Med Bridge Access Code: [email or web address] No latex band allergies   Manual therapy  Seated STM R vastus lateralis to decrease tension to patella  Supine R IR tibia, ER of femur grade 3-   Worked on decreasing R knee pain to promote ability to perform exercises for R hip    Therapeutic exercise  Supine bridge with march 10x3each LE,  emphasis on level pelvis better able to maintain level pelvis  L S/L R hip abduction 10x, then 10x5 seconds   Prone R glute max extension 10x5 seconds holds for 3 sets  L S/L R hip reverse clamshell 10x3  SLS R LE 10x5 seconds for 2 sets to promote isometric glute med muscle contraction   Standing hip machine height 1 R hip extension plate 70 for 73A1                        increase weight next visit if appropriate  Single leg dead lifts 2x with L UE assist, and PT assist for femoral control. R lateral hip weird sensation. Eases with rest.  Standing L hip abduction resisting yellow band around ankle with R LE on Air Ex pad to promote closed chain glute med muscle use R LE. 10x3   Standing L hip extension resisting yellow band around ankle with R LE on Air Ex pad to promote closed chain R hip strengthening 10x3  Standing R LE leg press resisting double blue band with bilateral UE assist 10x3  Side stepping with yellow band resisting hip abduction (around knees) 32 ft to the R and 32 ft to the L     Improved exercise technique, movement at target joints, use of target muscles aftermin tomod verbal, visual, tactile cues.    No complain of R hip pain throughout session. Able to tolerate increasing loads to R lateral hip (glute med and distal glute max) without aggravation of symptoms. Better able to negotiate ladders and steps overall based on pt subjective reports. Continued working on improving R knee symptoms to promote ability to perform closed chain exercises for her R hip.     PT Long Term Goals - 07/25/17 1149      PT LONG TERM GOAL #1   Title  Patient will have a decrease in R hip pain to 5/10 at worst to promote ability to climb ladders, negotiate stairs, and sleep on her side.     Baseline  10/10 R hip pain at most for for the past month (06/20/2017); 8/10 at most for the past 7 days (07/25/2017)    Time  6    Period  Weeks    Status   On-going    Target Date  09/07/17  PT LONG TERM GOAL #2   Title  Patient will improve her FOTO (hip) score by at least 10 points as a demonstration of improved function.     Baseline  50 (FOTO) 06/20/2017; 62 (07/25/2017)    Time  6    Period  Weeks    Status  Achieved    Target Date  09/07/17      PT LONG TERM GOAL #3   Title  Patient will improve R hip strength by at least 1/2 MMT grade to promote ability to negotiate stairs, climb ladders.     Time  6    Period  Weeks    Status  On-going    Target Date  09/07/17      PT LONG TERM GOAL #4   Title  Pt will report decreased difficulty climbing stairs and ladders to promote ability to perform work tasks.     Baseline  Pt states increased difficulty with stair negotiation and ladder climbing due to pain (06/20/2017); Ladder and stair climbing are probably a little better per pt (07/25/2017)    Time  6    Period  Weeks    Status  On-going    Target Date  09/07/17      PT LONG TERM GOAL #5   Title  Patient will improve her LEFS by at least 9 points as a demonstration of improved function.     Baseline  55/80 (06/20/2017); 59/80 (07/25/2017)    Time  6    Period  Weeks    Status  Partially Met    Target Date  09/07/17            Plan - 08/22/17 1146    Clinical Impression Statement  No complain of R hip pain throughout session. Able to tolerate increasing loads to R lateral hip (glute med and distal glute max) without aggravation of symptoms. Better able to negotiate ladders and steps overall based on pt subjective reports. Continued working on improving R knee symptoms to promote ability to perform closed chain exercises for her R hip.     Rehab Potential  Fair    Clinical Impairments Affecting Rehab Potential  Chronicity of condition, pain, TTP, hip weakness    PT Frequency  2x / week    PT Duration  6 weeks    PT Treatment/Interventions  Manual techniques;Therapeutic exercise;Therapeutic activities;Dry needling;Aquatic  Therapy;Electrical Stimulation;Iontophoresis 92m/ml Dexamethasone;Traction;Ultrasound;Gait training;Neuromuscular re-education;Patient/family education    PT Next Visit Plan  hip strengthening, lumbopelvic femoral control, trunk strengthening, manual techniques and modalities PRN    Consulted and Agree with Plan of Care  Patient       Patient will benefit from skilled therapeutic intervention in order to improve the following deficits and impairments:  Pain, Postural dysfunction, Improper body mechanics, Decreased strength, Difficulty walking  Visit Diagnosis: Pain in right hip  Muscle weakness (generalized)     Problem List Patient Active Problem List   Diagnosis Date Noted  . Hypertriglyceridemia 04/04/2017  . Breast cyst, left 04/04/2017  . Aortic atherosclerosis ([place] 04/04/2017  . Anxiety and depression 03/08/2017  . Dysphagia 03/08/2017  . Hip pain-right 03/08/2017  . Low back pain 03/08/2017  . Vaginal discharge 03/08/2017  . Flank pain 10/18/2016  . Asthma 06/01/2016    MJoneen BoersPT, DPT   08/22/2017, 12:26 PM  [facility] [address] [place] [address]Phone: [phone]  Fax:  [phone] Name: KNyanna Heideman  Petersen MRN: [id number] Date of Birth: 1965-10-16

## 2017-08-24 ENCOUNTER — Ambulatory Visit: Payer: 59

## 2017-08-24 DIAGNOSIS — M6281 Muscle weakness (generalized): Secondary | ICD-10-CM

## 2017-08-24 DIAGNOSIS — M25551 Pain in right hip: Secondary | ICD-10-CM

## 2017-08-24 NOTE — Therapy (Signed)
[facility] [address] [place], [address] Phone: [phone]   Fax:  [phone]  Physical Therapy Treatment  Patient Details  Name: Leah [name] MRN: [id number] Date of Birth: [birth year] Referring Provider: Delano Metz, FNP   Encounter Date: 08/24/2017  PT End of Session - 08/24/17 1122    Visit Number  16    Number of Visits  23    Date for PT Re-Evaluation  09/07/17    PT Start Time  1122    PT Stop Time  1201    PT Time Calculation (min)  39 min    Activity Tolerance  Patient tolerated treatment well    Behavior During Therapy  [facility] for tasks assessed/performed       Past Medical History:  Diagnosis Date  . Anxiety   . Asthma   . Carpal tunnel syndrome    R hand   . Colon polyps   . Depression   . Eosinophilic esophagitis    noted in [place] with GI path report 09/29/14   . Esophageal stricture    s/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 Edwin Cap   . Herniated disc, cervical    s/p epidural injection  . Hyperlipidemia   . Low back pain   . Migraines     Past Surgical History:  Procedure Laterality Date  . CESAREAN SECTION    . COLONOSCOPY WITH PROPOFOL N/A 03/27/2017   Procedure: COLONOSCOPY WITH PROPOFOL;  Surgeon: Jonathon Bellows, MD;  Location: [facility] ENDOSCOPY;  Service: Gastroenterology;  Laterality: N/A;  . ESOPHAGOGASTRODUODENOSCOPY    . ESOPHAGOGASTRODUODENOSCOPY (EGD) WITH PROPOFOL N/A 03/27/2017   Procedure: ESOPHAGOGASTRODUODENOSCOPY (EGD) WITH PROPOFOL WITH DILATION;  Surgeon: Jonathon Bellows, MD;  Location: [facility] ENDOSCOPY;  Service: Gastroenterology;  Laterality: N/A;  . THROAT SURGERY  2015  . UMBILICAL HERNIA REPAIR  2006    x 2   w mesh per pt    There were no vitals filed for this visit.  Subjective Assessment - 08/24/17 1123    Subjective  R hip is not too bad. No pain or discomfort. R hip ached the evening after last session, took medicine  and was fine in the morning. R hip did not bother her yesterday. Did some stairs yesterday.     Pertinent History  R hip pain since 10 years ago. Pt was doing dishes. Pt turned to the R to place dishes from the sink to the dish drainer 6 inches to her R. Pt felt a pop in her back and her whole R LE went numb.  Had not had an X-ray at the time but was diagnosed with sciatica. Had PT then which helped. Was out of work for about a month due to pain.  Was diagnosed with a hernated disc in her neck about 4 years ago around C5. Had 2-3 MRIs in [place] for her neck. Neck has not bothered her so much but affects her R hand.  Over the past year pt lost 44 lbs purposely.  Had a kidney infection and had to stop exercising.  Also lost another 20 lbs due to stress related issues.  Pt works at [employer] and has to consistently climb ladders.  Climbing up ladders does not really bother her hip but going down does.  Pt also adds that her R hip pain radiates down to her knee (L5 dermatome area)    Patient Stated Goals  Walk up and  down stairs without cringing.     Currently in Pain?  No/denies    Pain Score  0-No pain    Pain Onset  More than a month ago                               PT Education - 08/24/17 1141    Education provided  Yes    Education Details  ther-ex    [employer]) Educated  Patient    Methods  Explanation;Demonstration;Verbal cues;Tactile cues    Comprehension  Returned demonstration;Verbalized understanding         Objectives   Med Bridge Access Code: [email or web address] No latex band allergies    Therapeutic exercise  Side stepping with yellow band resisting hip abduction (around knees) 32 ft to the R and 32 ft to the L   Standing R LE leg press resisting double blue band with bilateral UE assist 10x2 with 5 second holds then 5x5 second holds    Standing hip machine height 1 R hip extension plate 81fr  [id number]  SLS R LE 10x5 seconds for 2 sets to promote isometric glute med muscle contraction    Standing L hip abduction resisting yellow band around ankle with R LE on Air Ex pad to promote closed chain glute med muscle use R LE. 10x3   Standing L hip extension resisting yellow band around ankle with R LE on Air Ex pad to promote closed chain R hip strengthening 10x3  Standing L hip flexion resisting yellow band around ankle with R LE on Air Ex pad to promote closed chain R hip strengthening 10x3  Supine bridge with march with red band resisting hip abduction/ER 10x3each LE, emphasis on level pelvis  L S/L R hip abduction 10x5 seconds for 2 sets  L S/L R hip reverse clamshell 10x3  Prone R glute max extension 10x5 seconds holds for 3 sets  Prone R hip extension, leg straight 10x3    Improved exercise technique, movement at target joints, use of target muscles aftermin tomod verbal, visual, tactile cues.    Pt seems to be  tolerating increasing loads to R hip well overall. Continued working on glute med and max strengthening to promote ability to negotiate ladders and stairs more comfortably for her R hip.  No complain of increased pain throughout session.      PT Long Term Goals - 07/25/17 1149      PT LONG TERM GOAL #1   Title  Patient will have a decrease in R hip pain to 5/10 at worst to promote ability to climb ladders, negotiate stairs, and sleep on her side.     Baseline  10/10 R hip pain at most for for the past month (06/20/2017); 8/10 at most for the past 7 days (07/25/2017)    Time  6    Period  Weeks    Status  On-going    Target Date  09/07/17      PT LONG TERM GOAL #2   Title  Patient will improve her FOTO (hip) score by at least 10 points as a demonstration of improved function.     Baseline  50 (FOTO) 06/20/2017; 62 (07/25/2017)    Time  6    Period  Weeks    Status  Achieved    Target Date  09/07/17      PT  LONG TERM GOAL #3   Title  Patient will improve  R hip strength by at least 1/2 MMT grade to promote ability to negotiate stairs, climb ladders.     Time  6    Period  Weeks    Status  On-going    Target Date  09/07/17      PT LONG TERM GOAL #4   Title  Pt will report decreased difficulty climbing stairs and ladders to promote ability to perform work tasks.     Baseline  Pt states increased difficulty with stair negotiation and ladder climbing due to pain (06/20/2017); Ladder and stair climbing are probably a little better per pt (07/25/2017)    Time  6    Period  Weeks    Status  On-going    Target Date  09/07/17      PT LONG TERM GOAL #5   Title  Patient will improve her LEFS by at least 9 points as a demonstration of improved function.     Baseline  55/80 (06/20/2017); 59/80 (07/25/2017)    Time  6    Period  Weeks    Status  Partially Met    Target Date  09/07/17            Plan - 08/24/17 1144    Clinical Impression Statement  Pt seems to be  tolerating increasing loads to R hip well overall. Continued working on glute med and max strengthening to promote ability to negotiate ladders and stairs more comfortably for her R hip.  No complain of increased pain throughout session.    Rehab Potential  Fair    Clinical Impairments Affecting Rehab Potential  Chronicity of condition, pain, TTP, hip weakness    PT Frequency  2x / week    PT Duration  6 weeks    PT Treatment/Interventions  Manual techniques;Therapeutic exercise;Therapeutic activities;Dry needling;Aquatic Therapy;Electrical Stimulation;Iontophoresis 28m/ml Dexamethasone;Traction;Ultrasound;Gait training;Neuromuscular re-education;Patient/family education    PT Next Visit Plan  hip strengthening, lumbopelvic femoral control, trunk strengthening, manual techniques and modalities PRN    Consulted and Agree with Plan of Care  Patient       Patient will benefit from skilled therapeutic intervention in order to improve the  following deficits and impairments:  Pain, Postural dysfunction, Improper body mechanics, Decreased strength, Difficulty walking  Visit Diagnosis: Pain in right hip  Muscle weakness (generalized)     Problem List Patient Active Problem List   Diagnosis Date Noted  . Hypertriglyceridemia 04/04/2017  . Breast cyst, left 04/04/2017  . Aortic atherosclerosis ([place] 04/04/2017  . Anxiety and depression 03/08/2017  . Dysphagia 03/08/2017  . Hip pain-right 03/08/2017  . Low back pain 03/08/2017  . Vaginal discharge 03/08/2017  . Flank pain 10/18/2016  . Asthma 06/01/2016   MJoneen BoersPT, DPT   08/24/2017, 12:12 PM  [facility] [address] [place] [address]Phone: [phone]  Fax:  [phone] Name: Leah WojdylaMRN: [id number] of Birth: 1Aug 17, 1967

## 2017-08-28 ENCOUNTER — Ambulatory Visit: Payer: 59

## 2017-08-28 DIAGNOSIS — M25551 Pain in right hip: Secondary | ICD-10-CM

## 2017-08-28 DIAGNOSIS — M6281 Muscle weakness (generalized): Secondary | ICD-10-CM

## 2017-08-28 NOTE — Therapy (Signed)
[facility] [address], [place], [address] Phone: [phone]   Fax:  [phone]  Physical Therapy Treatment  Patient Details  Name: Leah [name] MRN: [id number] Date of Birth: June 19, 1965 Referring Provider: Delano Metz, FNP   Encounter Date: 08/28/2017  PT End of Session - 08/28/17 1646    Visit Number  17    Number of Visits  23    Date for PT Re-Evaluation  09/07/17    PT Start Time  6767    PT Stop Time  1731    PT Time Calculation (min)  45 min    Activity Tolerance  Patient tolerated treatment well    Behavior During Therapy  [facility] for tasks assessed/performed       Past Medical History:  Diagnosis Date  . Anxiety   . Asthma   . Carpal tunnel syndrome    R hand   . Colon polyps   . Depression   . Eosinophilic esophagitis    noted in [place] with GI path report 09/29/14   . Esophageal stricture    s/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 Edwin Cap   . Herniated disc, cervical    s/p epidural injection  . Hyperlipidemia   . Low back pain   . Migraines     Past Surgical History:  Procedure Laterality Date  . CESAREAN SECTION    . COLONOSCOPY WITH PROPOFOL N/A 03/27/2017   Procedure: COLONOSCOPY WITH PROPOFOL;  Surgeon: Jonathon Bellows, MD;  Location: [facility] ENDOSCOPY;  Service: Gastroenterology;  Laterality: N/A;  . ESOPHAGOGASTRODUODENOSCOPY    . ESOPHAGOGASTRODUODENOSCOPY (EGD) WITH PROPOFOL N/A 03/27/2017   Procedure: ESOPHAGOGASTRODUODENOSCOPY (EGD) WITH PROPOFOL WITH DILATION;  Surgeon: Jonathon Bellows, MD;  Location: [facility] ENDOSCOPY;  Service: Gastroenterology;  Laterality: N/A;  . THROAT SURGERY  2015  . UMBILICAL HERNIA REPAIR  2006    x 2   w mesh per pt    There were no vitals filed for this visit.  Subjective Assessment - 08/28/17 1647    Subjective  R hip hurts a little. Has been working all day. Was stiff when she woke up this morning. 4-5/10 R hip  pain currently. 5/10 R hip pain at most for the past 7 days.     Pertinent History  R hip pain since 10 years ago. Pt was doing dishes. Pt turned to the R to place dishes from the sink to the dish drainer 6 inches to her R. Pt felt a pop in her back and her whole R LE went numb.  Had not had an X-ray at the time but was diagnosed with sciatica. Had PT then which helped. Was out of work for about a month due to pain.  Was diagnosed with a hernated disc in her neck about 4 years ago around C5. Had 2-3 MRIs in [place] for her neck. Neck has not bothered her so much but affects her R hand.  Over the past year pt lost 44 lbs purposely.  Had a kidney infection and had to stop exercising.  Also lost another 20 lbs due to stress related issues.  Pt works at [employer] and has to consistently climb ladders.  Climbing up ladders does not really bother her hip but going down does.  Pt also adds that her R hip pain radiates down to her knee (L5 dermatome area)    Patient Stated Goals  Walk up and down stairs without  cringing.     Currently in Pain?  Yes    Pain Score  5  4-5/10     Pain Onset  More than a month ago                               PT Education - 08/28/17 1700    Education provided  Yes    Education Details  ther-ex    [employer]) Educated  Patient    Methods  Explanation;Demonstration;Tactile cues;Verbal cues    Comprehension  Returned demonstration;Verbalized understanding          Objectives   Med Bridge Access Code: [email or web address] No latex band allergies    Therapeutic exercise   Standing R LE leg press resisting double blue band with bilateral UE assist 10x3 with 5 second holds  Give as part of HEP next visit if appropriate  Side stepping with yellow band resisting hip abduction (around knees) 32 ft to the R and 32 ft to the Lfor 2 sets  Give as part of HEP next visit if appropriate  Backwards wedding march with yellow band around knees  32 ft x 4. Slight R lateral hip discomfort which eases with rest.   Standing hip machine height 1 R hip extension plate [id number]  SLS R LE 10x5 seconds   Then with L LE leg swings 10 seconds x 5 for 2 sets  Forward step up onto and over Dyna disc with R LE 10x3  Lateral step up onto dyna disc with R LE 10x. R lateral hip discomfort which eases with rest.   Standing L hip abduction resisting yellow band around ankle with R LE on Air Ex pad to promote closed chain glute med muscle use R LE. 10x3   Standing L hip extension resisting yellow band around ankle with R LE on Air Ex pad to promote closed chain R hip strengthening10x3  Standing L hip flexion resisting yellow band around ankle with R LE on Air Ex pad to promote closed chain R hip strengthening10x3  Supine bridge with march with red band resisting hip abduction/ER 10x3each LE, emphasis on level pelvis   Prone R glute max extension 10x5 seconds holds for3sets  Give as part of her HEP next visit if appropriate  Improved exercise technique, movement at target joints, use of target muscles after min to mod verbal, visual, tactile cues.     Able to tolerate SLS activities and load to glute med and max with minimal to no R lateral hip symptoms. Pt making good progress with decreasing R hip pain with worst pain level being 5/10 for the past 7 days based on subjective reports.                  PT Long Term Goals - 07/25/17 1149      PT LONG TERM GOAL #1   Title  Patient will have a decrease in R hip pain to 5/10 at worst to promote ability to climb ladders, negotiate stairs, and sleep on her side.     Baseline  10/10 R hip pain at most for for the past month (06/20/2017); 8/10 at most for the past 7 days (07/25/2017)    Time  6    Period  Weeks    Status  On-going    Target Date  09/07/17      PT LONG TERM GOAL #2   Title  Patient will  improve her FOTO  (hip) score by at least 10 points as a demonstration of improved function.     Baseline  50 (FOTO) 06/20/2017; 62 (07/25/2017)    Time  6    Period  Weeks    Status  Achieved    Target Date  09/07/17      PT LONG TERM GOAL #3   Title  Patient will improve R hip strength by at least 1/2 MMT grade to promote ability to negotiate stairs, climb ladders.     Time  6    Period  Weeks    Status  On-going    Target Date  09/07/17      PT LONG TERM GOAL #4   Title  Pt will report decreased difficulty climbing stairs and ladders to promote ability to perform work tasks.     Baseline  Pt states increased difficulty with stair negotiation and ladder climbing due to pain (06/20/2017); Ladder and stair climbing are probably a little better per pt (07/25/2017)    Time  6    Period  Weeks    Status  On-going    Target Date  09/07/17      PT LONG TERM GOAL #5   Title  Patient will improve her LEFS by at least 9 points as a demonstration of improved function.     Baseline  55/80 (06/20/2017); 59/80 (07/25/2017)    Time  6    Period  Weeks    Status  Partially Met    Target Date  09/07/17            Plan - 08/28/17 1645    Clinical Impression Statement  Able to tolerate SLS activities and load to glute med and max with minimal to no R lateral hip symptoms. Pt making good progress with decreasing R hip pain with worst pain level being 5/10 for the past 7 days based on subjective reports.     Rehab Potential  Fair    Clinical Impairments Affecting Rehab Potential  Chronicity of condition, pain, TTP, hip weakness    PT Frequency  2x / week    PT Duration  6 weeks    PT Treatment/Interventions  Manual techniques;Therapeutic exercise;Therapeutic activities;Dry needling;Aquatic Therapy;Electrical Stimulation;Iontophoresis 45m/ml Dexamethasone;Traction;Ultrasound;Gait training;Neuromuscular re-education;Patient/family education    PT Next Visit Plan  hip strengthening, lumbopelvic femoral control, trunk  strengthening, manual techniques and modalities PRN    Consulted and Agree with Plan of Care  Patient       Patient will benefit from skilled therapeutic intervention in order to improve the following deficits and impairments:  Pain, Postural dysfunction, Improper body mechanics, Decreased strength, Difficulty walking  Visit Diagnosis: Pain in right hip  Muscle weakness (generalized)     Problem List Patient Active Problem List   Diagnosis Date Noted  . Hypertriglyceridemia 04/04/2017  . Breast cyst, left 04/04/2017  . Aortic atherosclerosis ([place] 04/04/2017  . Anxiety and depression 03/08/2017  . Dysphagia 03/08/2017  . Hip pain-right 03/08/2017  . Low back pain 03/08/2017  . Vaginal discharge 03/08/2017  . Flank pain 10/18/2016  . Asthma 06/01/2016    MJoneen BoersPT, DPT   08/28/2017, 6:49 PM  [facility] [address] [place] [address]Phone: [phone]  Fax:  [phone] Name: KMarnesha GagenMRN: [id number] of Birth: 109-19-1967

## 2017-09-04 ENCOUNTER — Ambulatory Visit: Payer: 59

## 2017-09-04 DIAGNOSIS — M6281 Muscle weakness (generalized): Secondary | ICD-10-CM

## 2017-09-04 DIAGNOSIS — M25551 Pain in right hip: Secondary | ICD-10-CM

## 2017-09-04 NOTE — Therapy (Signed)
[facility] [address] [place], [address] Phone: [phone]   Fax:  [phone]  Physical Therapy Treatment And Discharge Summary  Patient Details  Name: Leah [name] MRN: [id number] Date of Birth: 11-23-1965 Referring Provider: Delano Metz, FNP   Encounter Date: 09/04/2017  PT End of Session - 09/04/17 1743    Visit Number  18    Number of Visits  23    Date for PT Re-Evaluation  09/07/17    PT Start Time  1443    PT Stop Time  1834    PT Time Calculation (min)  49 min    Activity Tolerance  Patient tolerated treatment well    Behavior During Therapy  [facility] for tasks assessed/performed       Past Medical History:  Diagnosis Date  . Anxiety   . Asthma   . Carpal tunnel syndrome    R hand   . Colon polyps   . Depression   . Eosinophilic esophagitis    noted in [place] with GI path report 09/29/14   . Esophageal stricture    s/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 Edwin Cap   . Herniated disc, cervical    s/p epidural injection  . Hyperlipidemia   . Low back pain   . Migraines     Past Surgical History:  Procedure Laterality Date  . CESAREAN SECTION    . COLONOSCOPY WITH PROPOFOL N/A 03/27/2017   Procedure: COLONOSCOPY WITH PROPOFOL;  Surgeon: Jonathon Bellows, MD;  Location[facility] ENDOSCOPY;  Service: Gastroenterology;  Laterality: N/A;  . ESOPHAGOGASTRODUODENOSCOPY    . ESOPHAGOGASTRODUODENOSCOPY (EGD) WITH PROPOFOL N/A 03/27/2017   Procedure: ESOPHAGOGASTRODUODENOSCOPY (EGD) WITH PROPOFOL WITH DILATION;  Surgeon: Jonathon Bellows, MD;  Location: [facility] ENDOSCOPY;  Service: Gastroenterology;  Laterality: N/A;  . THROAT SURGERY  2015  . UMBILICAL HERNIA REPAIR  2006    x 2   w mesh per pt    There were no vitals filed for this visit.  Subjective Assessment - 09/04/17 1746    Subjective  R hip was a little achy Friday. Did the treadmill for about 30 min (mostly  walking). 0/10 R hip pain currently. The ladders were fine at work. Hip was stiff when going up and down stairs at the end of the day.   3.5/10 R hip pain at most for the past 7 days.  Feels good about today being her gradutation day and contining progress with her HEP.     Pertinent History  R hip pain since 10 years ago. Pt was doing dishes. Pt turned to the R to place dishes from the sink to the dish drainer 6 inches to her R. Pt felt a pop in her back and her whole R LE went numb.  Had not had an X-ray at the time but was diagnosed with sciatica. Had PT then which helped. Was out of work for about a month due to pain.  Was diagnosed with a hernated disc in her neck about 4 years ago around C5. Had 2-3 MRIs in [place] for her neck. Neck has not bothered her so much but affects her R hand.  Over the past year pt lost 44 lbs purposely.  Had a kidney infection and had to stop exercising.  Also lost another 20 lbs due to stress related issues.  Pt works at [employer] and has to consistently climb ladders.  Climbing up ladders does  not really bother her hip but going down does.  Pt also adds that her R hip pain radiates down to her knee (L5 dermatome area)    Patient Stated Goals  Walk up and down stairs without cringing.     Currently in Pain?  No/denies    Pain Score  0-No pain    Pain Onset  More than a month ago         [facility] PT Assessment - 09/04/17 0001      Strength   Right Hip Flexion  4/5    Right Hip Extension  4/5 no pain    Right Hip ABduction  4/5 no pain                           PT Education - 09/04/17 1820    Education provided  Yes    Education Details  ther-ex, HEP    Person(s) Educated  Patient    Methods  Explanation;Demonstration;Tactile cues;Verbal cues;Handout    Comprehension  Returned demonstration;Verbalized understanding         Objectives   Med Bridge Access Code: [email or web address] No latex band allergies    Therapeutic  exercise   Prone R glute max extension 10x3 with 2 lbs around distal thigh  Prone manually resisted R hip extension, S/L R hip abduction, seated R hip flexion 1x each way  No pain (pain during last time measured on 07/25/2017)  S/L R hip abduction 10x3 S/L R hip reverse clamshells 10x3 Supine bridge with march 10x3 each LE  Side stepping with yellow band resisting hip abduction (around knees) 32 ft to the R and 32 ft to the Lfor 2 sets  Reviewed and given as part of her HEP. Pt demonstrated and verbalized understanding.    Standing R LE leg press resisting double blue band with bilateral UE assist 10x, then 10x3 with 5 second holds            Standing hip machine height 1 R hip extension plate [id number]   Forward step up onto and over Dyna disc with R LE 10x3   Improved exercise technique, movement at target joints, use of target muscles after min verbal, visual, tactile cues.   Patient demonstrates significant decrease in R hip pain with worst pain level being 3.5/10 at most for the past 7 days, improved glute max, med, and ilopsoas strength without complain of pain with manual resistance, and improved ability to perform functional tasks such as climbing stairs and ladders more comfortably for her R hip since initial evaluation. Patient has made very good progress with PT towards goals. Skilled physical therapy services discharged with patient continuing progress with her exercises at home.     PT Long Term Goals - 09/04/17 1845      PT LONG TERM GOAL #1   Title  Patient will have a decrease in R hip pain to 5/10 at worst to promote ability to climb ladders, negotiate stairs, and sleep on her side.     Baseline  10/10 R hip pain at most for for the past month (06/20/2017); 8/10 at most for the past 7 days (07/25/2017); 3.5/10 at most for the past 7 days (09/04/2017)    Time  6    Period  Weeks    Status  Achieved    Target Date  09/07/17       PT LONG TERM GOAL #2   Title  Patient will improve  her FOTO (hip) score by at least 10 points as a demonstration of improved function.     Baseline  50 (FOTO) 06/20/2017; 62 (07/25/2017); 98 (09/04/2017)    Time  6    Period  Weeks    Status  Achieved    Target Date  09/07/17      PT LONG TERM GOAL #3   Title  Patient will improve R hip strength by at least 1/2 MMT grade to promote ability to negotiate stairs, climb ladders.     Time  6    Period  Weeks    Status  Achieved    Target Date  09/07/17      PT LONG TERM GOAL #4   Title  Pt will report decreased difficulty climbing stairs and ladders to promote ability to perform work tasks.     Baseline  Pt states increased difficulty with stair negotiation and ladder climbing due to pain (06/20/2017); Ladder and stair climbing are probably a little better per pt (07/25/2017); no pain, just stiff (09/04/2017)    Time  6    Period  Weeks    Status  Achieved    Target Date  09/07/17      PT LONG TERM GOAL #5   Title  Patient will improve her LEFS by at least 9 points as a demonstration of improved function.     Baseline  55/80 (06/20/2017); 59/80 (07/25/2017); 79/80 (09/04/2017)    Time  6    Period  Weeks    Status  Achieved    Target Date  09/07/17            Plan - 09/04/17 1743    Clinical Impression Statement  Patient demonstrates significant decrease in R hip pain with worst pain level being 3.5/10 at most for the past 7 days, improved glute max, med, and ilopsoas strength without complain of pain with manual resistance, and improved ability to perform functional tasks such as climbing stairs and ladders more comfortably for her R hip since initial evaluation. Patient has made very good progress with PT towards goals. Skilled physical therapy services discharged with patient continuing progress with her exercises at home.     Rehab Potential  --    Clinical Impairments Affecting Rehab Potential  --    PT Frequency  --    PT Duration  --     PT Treatment/Interventions  Manual techniques;Therapeutic exercise;Therapeutic activities;Gait training;Neuromuscular re-education;Patient/family education    PT Next Visit Plan  Continue progress with her HEP    Consulted and Agree with Plan of Care  Patient       Patient will benefit from skilled therapeutic intervention in order to improve the following deficits and impairments:  Pain, Postural dysfunction, Improper body mechanics, Decreased strength, Difficulty walking  Visit Diagnosis: Pain in right hip  Muscle weakness (generalized)     Problem List Patient Active Problem List   Diagnosis Date Noted  . Hypertriglyceridemia 04/04/2017  . Breast cyst, left 04/04/2017  . Aortic atherosclerosis ([place]) 04/04/2017  . Anxiety and depression 03/08/2017  . Dysphagia 03/08/2017  . Hip pain-right 03/08/2017  . Low back pain 03/08/2017  . Vaginal discharge 03/08/2017  . Flank pain 10/18/2016  . Asthma 06/01/2016    Thank you for your referral.  Joneen Boers PT, DPT  09/04/2017, 6:52 PM  [facility] [address], [place], [address] Phone: [phone]   Fax:  [phone]  Name: Leah [name] MRN: [id number] Date of Birth: 10-Aug-1965

## 2017-09-04 NOTE — Patient Instructions (Signed)
Med Bridge Access Code: [email or web address]   Side Stepping with Resistance at Feet  30 ft x 2 each direction.

## 2017-09-05 ENCOUNTER — Ambulatory Visit: Payer: 59

## 2017-09-06 ENCOUNTER — Ambulatory Visit (INDEPENDENT_AMBULATORY_CARE_PROVIDER_SITE_OTHER): Payer: 59 | Admitting: Psychology

## 2017-09-06 DIAGNOSIS — F419 Anxiety disorder, unspecified: Secondary | ICD-10-CM | POA: Diagnosis not present

## 2017-09-06 DIAGNOSIS — F329 Major depressive disorder, single episode, unspecified: Secondary | ICD-10-CM

## 2017-09-18 ENCOUNTER — Other Ambulatory Visit: Payer: Self-pay | Admitting: Internal Medicine

## 2017-09-18 DIAGNOSIS — G8929 Other chronic pain: Secondary | ICD-10-CM

## 2017-09-18 DIAGNOSIS — M545 Low back pain: Principal | ICD-10-CM

## 2017-09-18 MED ORDER — IBUPROFEN 800 MG PO TABS: 800.0000 mg | ORAL_TABLET | Freq: Three times a day (TID) | ORAL | 2 refills | Status: DC | PRN

## 2017-09-21 ENCOUNTER — Ambulatory Visit (INDEPENDENT_AMBULATORY_CARE_PROVIDER_SITE_OTHER): Payer: 59 | Admitting: Family Medicine

## 2017-09-21 ENCOUNTER — Encounter: Payer: Self-pay | Admitting: Family Medicine

## 2017-09-21 VITALS — BP 110/74 | HR 76 | Temp 98.1°F | Resp 16 | Wt 152.1 lb

## 2017-09-21 DIAGNOSIS — B354 Tinea corporis: Secondary | ICD-10-CM | POA: Diagnosis not present

## 2017-09-21 MED ORDER — KETOCONAZOLE 2 % EX CREA: 1.0000 "application " | TOPICAL_CREAM | Freq: Every day | CUTANEOUS | 0 refills | Status: DC

## 2017-09-21 NOTE — Patient Instructions (Addendum)
Please continue diflucan as previously prescribed.  Cream can be used on most bothersome areas.  Evaluate and treat kitten if needed.  If rash does not improve, worsens, or new symptoms develop, follow up for further evaluation and treatment.   Body Ringworm Body ringworm is an infection of the skin that often causes a ring-shaped rash. Body ringworm can affect any part of your skin. It can spread easily to others. Body ringworm is also called tinea corporis. What are the causes? This condition is caused by funguses called dermatophytes. The condition develops when these funguses grow out of control on the skin. You can get this condition if you touch a person or animal that has it. You can also get it if you share clothing, bedding, towels, or any other object with an infected person or pet. What increases the risk? This condition is more likely to develop in:  Athletes who often make skin-to-skin contact with other athletes, such as wrestlers.  People who share equipment and mats.  People with a weakened immune system.  What are the signs or symptoms? Symptoms of this condition include:  Itchy, raised red spots and bumps.  Red scaly patches.  A ring-shaped rash. The rash may have: ? A clear center. ? Scales or red bumps at its center. ? Redness near its borders. ? Dry and scaly skin on or around it.  How is this diagnosed? This condition can usually be diagnosed with a skin exam. A skin scraping may be taken from the affected area and examined under a microscope to see if the fungus is present. How is this treated? This condition may be treated with:  An antifungal cream or ointment.  An antifungal shampoo.  Antifungal medicines. These may be prescribed if your ringworm is severe, keeps coming back, or lasts a long time.  Follow these instructions at home:  Take over-the-counter and prescription medicines only as told by your health care provider.  If you were  given an antifungal cream or ointment: ? Use it as told by your health care provider. ? Wash the infected area and dry it completely before applying the cream or ointment.  If you were given an antifungal shampoo: ? Use it as told by your health care provider. ? Leave the shampoo on your body for 3-5 minutes before rinsing.  While you have a rash: ? Wear loose clothing to stop clothes from rubbing and irritating it. ? Wash or change your bed sheets every night.  If your pet has the same infection, take your pet to see a Animal nutritionist. How is this prevented?  Practice good hygiene.  Wear sandals or shoes in public places and showers.  Do not share personal items with others.  Avoid touching red patches of skin on other people.  Avoid touching pets that have bald spots.  If you touch an animal that has a bald spot, wash your hands. Contact a health care provider if:  Your rash continues to spread after 7 days of treatment.  Your rash is not gone in 4 weeks.  The area around your rash gets red, warm, tender, and swollen. This information is not intended to replace advice given to you by your health care provider. Make sure you discuss any questions you have with your health care provider. Document Released: 02/26/2000 Document Revised: 08/06/2015 Document Reviewed: 12/25/2014 Elsevier Interactive Patient Education  [employer].

## 2017-09-21 NOTE — Progress Notes (Signed)
Patient ID: Leah [name], female   DOB: 1965/10/07, 52 y.o.   MRN: [id number]   PCP: McLean-Scocuzza, Nino Glow, MD  Subjective:  Leah [name] is a 52 y.o. year old very pleasant female patient who presents with a Rash: Initial distribution: lesion on right lower leg and then rash noted on forearms and lower legs bilaterally Prior history of this rash: No Discomfort associated: No Associated symptoms:  Pruritus "itches like crazy" Change in rash:  Red, circular, and spreading from lower legs to forearms Denies:  Fever, chills, sweats, myalgias, difficulty swallowing, hoarseness, SOB, N/V, Abdominal pain, or tightening throat. No new exposures of soaps, lotions, laundry detergent, fabric softeners, foods, medications, herbal supplements, STDs, plants, or insects. New kitten in house within the last 2 weeks. Leah [name] has not been evaluated for symptoms but will be seeing vet today.   Evaluated and treated at an urgent care clinic on 09/16/17 for tinea corporis. She is adherent with treatment without adverse effects. States that rash remains extremely pruritic.   ` -started: 9 days ago  symptoms are not improving -previous treatments: Prednisone taper, kenalog injection, and she has taken one diflucan -sick contacts/travel/risks: denies flu exposure.  -Hx of: asthma  ROS-denies fever, SOB, NVD  Pertinent Past Medical History- Asthma  Medications- reviewed  Current Outpatient Medications  Medication Sig Dispense Refill  . Acetaminophen (TYLENOL GO TABS EXTRA STRENGTH PO) Take by mouth every 8 (eight) hours.    Marland Kitchen ibuprofen (ADVIL,MOTRIN) 800 MG tablet Take 1 tablet (800 mg total) by mouth every 8 (eight) hours as needed (use sparingly). 90 tablet 2  . methocarbamol (ROBAXIN) 750 MG tablet TAKE 1 TABLET (750 MG TOTAL) BY MOUTH EVERY 6 (SIX) HOURS AS NEEDED FOR MUSCLE SPASMS. 120 tablet 1  . methocarbamol (ROBAXIN) 750 MG tablet Take by mouth.    . omega-3 acid ethyl esters (LOVAZA) 1  g capsule Take 2 capsules (2 g total) by mouth 2 (two) times daily. 120 capsule 3  . omeprazole (PRILOSEC) 40 MG capsule Take 1 capsule (40 mg total) by mouth daily. 90 capsule 3  . predniSONE (DELTASONE) 5 MG tablet Take 2 tablets by mouth once daily on days 7-9, then 1 tablet by mouth once daily on days 10-12    . valACYclovir (VALTREX) 1000 MG tablet Take 1 tablet (1,000 mg total) by mouth daily. X 5 days with outbreak as needed 30 tablet 11   No current facility-administered medications for this visit.     Objective: BP 110/74 (BP Location: Left Arm, Patient Position: Sitting, Cuff Size: Normal)   Pulse 76   Temp 98.1 F (36.7 C) (Oral)   Resp 16   Wt 152 lb 2 oz (69 kg)   SpO2 98%   BMI 26.95 kg/m  Gen: NAD, resting comfortably HEENT: Oropharynx is clear and moist CV: RRR no murmurs rubs or gallops Lungs: CTAB no crackles, wheeze, rhonchi Abdomen: soft/nontender/nondistended/normal bowel sounds. No rebound or guarding.  Ext: no edema Skin: warm, dry, scattered, circular, erythematous, lesions with central clearing and raised borders noted on lower legs and forearms bilaterally. Neuro: grossly normal, moves all extremities  Assessment/Plan: 1. Dermatophytosis of body   2. Tinea corporis  - ketoconazole (NIZORAL) 2 % cream; Apply 1 application topically daily.  Dispense: 30 g; Refill: 0  Minimal improvement; History and exam today are suggestive of tinea corporis most likely due to recent addition of new kitten. She has been provided diflucan 150 mg once weekly for 5 weeks and prednisone  taper for symptoms at urgent care. We discussed the importance of diflucan once weekly and provided her with ketoconazole topically for most bothersome areas. Further advised the importance of having kitten evaluated and treated which she is doing today. She started medications on 09/16/17 however kitten was not evaluated and treated. Continued exposure to kitten that has not been evaluated and  treated is likely source of slow improvement.  Finally, we reviewed reasons to return to care including if symptoms worsen or persist or new concerns arise- once again particularly rash that does not improve,  shortness of breath, N/V, or fever. Provided instructions regarding changing bed sheets/towels to decrease risk of spreading.  Reviewed importance of calling 911immediately if symptoms of SOB, difficulty swallowing, or throat tightening.   Laurita Quint, FNP

## 2017-10-03 ENCOUNTER — Ambulatory Visit (INDEPENDENT_AMBULATORY_CARE_PROVIDER_SITE_OTHER): Payer: 59 | Admitting: Internal Medicine

## 2017-10-03 ENCOUNTER — Encounter: Payer: Self-pay | Admitting: Internal Medicine

## 2017-10-03 ENCOUNTER — Ambulatory Visit (INDEPENDENT_AMBULATORY_CARE_PROVIDER_SITE_OTHER): Payer: 59

## 2017-10-03 VITALS — BP 108/82 | HR 65 | Temp 98.1°F | Resp 16 | Ht 63.0 in | Wt 151.4 lb

## 2017-10-03 DIAGNOSIS — Z0184 Encounter for antibody response examination: Secondary | ICD-10-CM

## 2017-10-03 DIAGNOSIS — R1310 Dysphagia, unspecified: Secondary | ICD-10-CM

## 2017-10-03 DIAGNOSIS — Z1159 Encounter for screening for other viral diseases: Secondary | ICD-10-CM

## 2017-10-03 DIAGNOSIS — E785 Hyperlipidemia, unspecified: Secondary | ICD-10-CM

## 2017-10-03 DIAGNOSIS — B354 Tinea corporis: Secondary | ICD-10-CM | POA: Diagnosis not present

## 2017-10-03 DIAGNOSIS — N6002 Solitary cyst of left breast: Secondary | ICD-10-CM | POA: Diagnosis not present

## 2017-10-03 DIAGNOSIS — M5440 Lumbago with sciatica, unspecified side: Secondary | ICD-10-CM

## 2017-10-03 DIAGNOSIS — M5412 Radiculopathy, cervical region: Secondary | ICD-10-CM

## 2017-10-03 DIAGNOSIS — G8929 Other chronic pain: Secondary | ICD-10-CM | POA: Diagnosis not present

## 2017-10-03 HISTORY — DX: Tinea corporis: B35.4

## 2017-10-03 LAB — LIPID PANEL
CHOL/HDL RATIO: 4
Cholesterol: 252 mg/dL — ABNORMAL HIGH (ref 0–200)
HDL: 61.7 mg/dL (ref >39.00)
LDL CALC: 174 mg/dL — AB (ref 0–99)
NonHDL: 190.09
TRIGLYCERIDES: 82 mg/dL (ref 0.0–149.0)
VLDL: 16.4 mg/dL (ref 0.0–40.0)

## 2017-10-03 MED ORDER — FLUCONAZOLE 150 MG PO TABS: 150.0000 mg | ORAL_TABLET | ORAL | 0 refills | Status: DC

## 2017-10-03 NOTE — Progress Notes (Signed)
Chief Complaint  Patient presents with  . Follow-up   F/u  1. Chronic neck pain C5/6 herniated disc prior MRI in CT c/o numbness right hand and arm. Tried traction 02/2013 and had traction at home but made neck pain worse and dizzy. Had cortisone 2015, 2016, 2017, 2018 taking NSAIDS prn and Robaxin prn for neck pain  2. Low back pain 2/2 DDD better for PT x 10 weeks for hip ended PT 2 weeks ago  3. Tinea to arms and legs from cats using diflucan weekly week 4/5 and will do 5 more weeks saw [facility] 10/02/17 4. HLD not exercising eating more carbs  5. Dysphagia improved   Review of Systems  Constitutional: Negative for weight loss.  HENT:       No dysphagia   Eyes: Negative for blurred vision.  Respiratory: Negative for shortness of breath.   Cardiovascular: Negative for chest pain.  Gastrointestinal: Negative for abdominal pain.  Musculoskeletal: Positive for neck pain. Negative for back pain.  Neurological: Negative for headaches.  Psychiatric/Behavioral: Negative for depression.   Past Medical History:  Diagnosis Date  . Anxiety   . Asthma   . Carpal tunnel syndrome    R hand   . Colon polyps   . Depression   . Eosinophilic esophagitis    noted in [place] with GI path report 09/29/14   . Esophageal stricture    s/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 Edwin Cap   . Herniated disc, cervical    s/p epidural injection  . Hyperlipidemia   . Low back pain   . Migraines    Past Surgical History:  Procedure Laterality Date  . CESAREAN SECTION    . COLONOSCOPY WITH PROPOFOL N/A 03/27/2017   Procedure: COLONOSCOPY WITH PROPOFOL;  Surgeon: Jonathon Bellows, MD;  Location[facility] ENDOSCOPY;  Service: Gastroenterology;  Laterality: N/A;  . ESOPHAGOGASTRODUODENOSCOPY    . ESOPHAGOGASTRODUODENOSCOPY (EGD) WITH PROPOFOL N/A 03/27/2017   Procedure: ESOPHAGOGASTRODUODENOSCOPY (EGD) WITH PROPOFOL WITH DILATION;  Surgeon: Jonathon Bellows, MD;  Location: [facility] ENDOSCOPY;  Service:  Gastroenterology;  Laterality: N/A;  . THROAT SURGERY  2015  . UMBILICAL HERNIA REPAIR  2006    x 2   w mesh per pt   Family History  Problem Relation Age of Onset  . Breast cancer Mother   . Thyroid disease Mother   . Colon polyps Mother   . Prostate cancer Father   . Cancer - Prostate Father   . Breast cancer Maternal Grandmother    Social History   Socioeconomic History  . Marital status: Single    Spouse name: Not on file  . Number of children: Not on file  . Years of education: Not on file  . Highest education level: Not on file  Occupational History  . Not on file  Social Needs  . Financial resource strain: Not on file  . Food insecurity:    Worry: Not on file    Inability: Not on file  . Transportation needs:    Medical: Not on file    Non-medical: Not on file  Tobacco Use  . Smoking status: Former Smoker    Packs/day: 1.00    Years: 20.00    Pack years: 20.00    Last attempt to quit: 06/02/2006    Years since quitting: 11.3  . Smokeless tobacco: Never Used  Substance and Sexual Activity  . Alcohol use: Yes    Alcohol/week: 1.2 - 1.8 oz    Types: 2 -  3 Standard drinks or equivalent per week    Comment: socially  . Drug use: No  . Sexual activity: Yes    Partners: Male  Lifestyle  . Physical activity:    Days per week: Not on file    Minutes per session: Not on file  . Stress: Not on file  Relationships  . Social connections:    Talks on phone: Not on file    Gets together: Not on file    Attends religious service: Not on file    Active member of club or organization: Not on file    Attends meetings of clubs or organizations: Not on file    Relationship status: Not on file  . Intimate partner violence:    Fear of current or ex partner: Not on file    Emotionally abused: Not on file    Physically abused: Not on file    Forced sexual activity: Not on file  Other Topics Concern  . Not on file  Social History Narrative   Works in retail    2 kids  53 and 33 son and daughter live in [place]. As of 02/26/17    Divorced.    Former smoker 20+ years quit in 1997 then started again for 5 years and quit 10-12 years ago as of 03/08/17    Current Meds  Medication Sig  . Acetaminophen (TYLENOL GO TABS EXTRA STRENGTH PO) Take by mouth every 8 (eight) hours.  . fluconazole (DIFLUCAN) 150 MG tablet Take 1 tablet (150 mg total) by mouth once a week.  Marland Kitchen ibuprofen (ADVIL,MOTRIN) 800 MG tablet Take 1 tablet (800 mg total) by mouth every 8 (eight) hours as needed (use sparingly).  Marland Kitchen ketoconazole (NIZORAL) 2 % cream Apply 1 application topically daily.  . methocarbamol (ROBAXIN) 750 MG tablet TAKE 1 TABLET (750 MG TOTAL) BY MOUTH EVERY 6 (SIX) HOURS AS NEEDED FOR MUSCLE SPASMS.  . methocarbamol (ROBAXIN) 750 MG tablet Take by mouth.  . omega-3 acid ethyl esters (LOVAZA) 1 g capsule Take 2 capsules (2 g total) by mouth 2 (two) times daily.  Marland Kitchen omeprazole (PRILOSEC) 40 MG capsule Take 1 capsule (40 mg total) by mouth daily.  . valACYclovir (VALTREX) 1000 MG tablet Take 1 tablet (1,000 mg total) by mouth daily. X 5 days with outbreak as needed  . [DISCONTINUED] fluconazole (DIFLUCAN) 150 MG tablet Take 150 mg by mouth once a week.  . [DISCONTINUED] predniSONE (DELTASONE) 5 MG tablet Take 2 tablets by mouth once daily on days 7-9, then 1 tablet by mouth once daily on days 10-12   Allergies  Allergen Reactions  . Penicillins   . Sulfa Antibiotics    Recent Results (from the past 2160 hour(s))  POCT Urinalysis Dipstick (Automated)     Status: Abnormal   Collection Time: 07/11/17  9:17 AM  Result Value Ref Range   Color, UA yellow    Clarity, UA clear    Glucose, UA negative    Bilirubin, UA negative    Ketones, UA negative    Spec Grav, UA 1.015 1.010 - 1.025   Blood, UA negative    pH, UA 6.0 5.0 - 8.0   Protein, UA negative    Urobilinogen, UA negative (A) 0.2 or 1.0 E.U./dL   Nitrite, UA negative    Leukocytes, UA Negative Negative  Urine Culture      Status: None   Collection Time: 07/11/17  9:45 AM  Result Value Ref Range   MICRO NUMBER: [id number]  SPECIMEN QUALITY: ADEQUATE    Sample Source NOT GIVEN    STATUS: FINAL    Result: No Growth    Objective  Body mass index is 26.81 kg/m. Wt Readings from Last 3 Encounters:  10/03/17 151 lb 6 oz (68.7 kg)  09/21/17 152 lb 2 oz (69 kg)  07/11/17 145 lb 12 oz (66.1 kg)   Temp Readings from Last 3 Encounters:  10/03/17 98.1 F (36.7 C) (Oral)  09/21/17 98.1 F (36.7 C) (Oral)  07/11/17 98.3 F (36.8 C) (Oral)   BP Readings from Last 3 Encounters:  10/03/17 108/82  09/21/17 110/74  07/11/17 (!) 160/82   Pulse Readings from Last 3 Encounters:  10/03/17 65  09/21/17 76  07/11/17 68    Physical Exam  Constitutional: She is oriented to person, place, and time. Vital signs are normal. She appears well-developed and well-nourished. She is cooperative.  HENT:  Head: Normocephalic and atraumatic.  Mouth/Throat: Oropharynx is clear and moist and mucous membranes are normal.  Eyes: Pupils are equal, round, and reactive to light. Conjunctivae are normal.  Cardiovascular: Normal rate, regular rhythm and normal heart sounds.  Pulmonary/Chest: Effort normal and breath sounds normal.  Musculoskeletal:  Limited rom right and ext/flextion and pain with ROM  Neurological: She is alert and oriented to person, place, and time. Gait normal.  Skin: Skin is warm, dry and intact. Rash noted.  Tinea to arms and legs   Psychiatric: She has a normal mood and affect. Her speech is normal and behavior is normal. Judgment and thought content normal. Cognition and memory are normal.  Nursing note and vitals reviewed.   Assessment   1. Chronic neck pain herniated disc C5/6  2. Low back pain chronic, DDD 3. Tinea  4. HLD  5. Dysphagia  6. HM Plan   1.  Xray consider MRI  Prn nsaids and muscle relaxers  2. resovled for now  3. Diflucan 4/5 weeks will continue x 5 more weeks check lfts  11/2017  Topical nizoral  rec cats get tx'ed  4. Repeat lipid today  Given cholesterol handout rec exercise  5. Monitor  6.  Had flu shot Tdap see last visit 03/08/17 UTD had Boostrix 08/17/15  rec hep B vaccine will call back to sch  Check MMR status   EGD/colonoscopy had 03/27/17 Dr. Bailey Mech diverticulosis, serrated polyps neg EGD for eosinophillic esophagitis repeat colonoscopy due in  5years  Refer mammogram b/l dx mammo and Left [place] due 10/07/17 sch'ed for next week FH breast cancer in mom and m gm in 31s.  Pap 04/04/17 h/o abnormal neg pap neg HPV Former smoker congratulated on quitting    Provider: Dr. Olivia Mackie McLean-Scocuzza-Internal Medicine

## 2017-10-03 NOTE — Progress Notes (Signed)
Pre-visit discussion using our clinic review tool. No additional management support is needed unless otherwise documented below in the visit note.  

## 2017-10-03 NOTE — Patient Instructions (Addendum)
Call back for hepatitis B vaccine at 0 months, 1 month and 6 months  Please sch labs week of 11/2017 Consider MRI neck   Hepatitis B Vaccine, Recombinant injection What is this medicine? HEPATITIS B VACCINE (hep uh TAHY tis B VAK seen) is a vaccine. It is used to prevent an infection with the hepatitis B virus. This medicine may be used for other purposes; ask your health care provider or pharmacist if you have questions. COMMON BRAND NAME(S): Engerix-B, Recombivax HB What should I tell my health care provider before I take this medicine? They need to know if you have any of these conditions: -fever, infection -heart disease -hepatitis B infection -immune system problems -kidney disease -an unusual or allergic reaction to vaccines, yeast, other medicines, foods, dyes, or preservatives -pregnant or trying to get pregnant -breast-feeding How should I use this medicine? This vaccine is for injection into a muscle. It is given by a health care professional. A copy of Vaccine Information Statements will be given before each vaccination. Read this sheet carefully each time. The sheet may change frequently. Talk to your pediatrician regarding the use of this medicine in children. While this drug may be prescribed for children as young as newborn for selected conditions, precautions do apply. Overdosage: If you think you have taken too much of this medicine contact a poison control center or emergency room at once. NOTE: This medicine is only for you. Do not share this medicine with others. What if I miss a dose? It is important not to miss your dose. Call your doctor or health care professional if you are unable to keep an appointment. What may interact with this medicine? -medicines that suppress your immune function like adalimumab, anakinra, infliximab -medicines to treat cancer -steroid medicines like prednisone or cortisone This list may not describe all possible interactions. Give your  health care provider a list of all the medicines, herbs, non-prescription drugs, or dietary supplements you use. Also tell them if you smoke, drink alcohol, or use illegal drugs. Some items may interact with your medicine. What should I watch for while using this medicine? See your health care provider for all shots of this vaccine as directed. You must have 3 shots of this vaccine for protection from hepatitis B infection. Tell your doctor right away if you have any serious or unusual side effects after getting this vaccine. What side effects may I notice from receiving this medicine? Side effects that you should report to your doctor or health care professional as soon as possible: -allergic reactions like skin rash, itching or hives, swelling of the face, lips, or tongue -breathing problems -confused, irritated -fast, irregular heartbeat -flu-like syndrome -numb, tingling pain -seizures -unusually weak or tired Side effects that usually do not require medical attention (report to your doctor or health care professional if they continue or are bothersome): -diarrhea -fever -headache -loss of appetite -muscle pain -nausea -pain, redness, swelling, or irritation at site where injected -tiredness This list may not describe all possible side effects. Call your doctor for medical advice about side effects. You may report side effects to FDA at 1-800-FDA-1088. Where should I keep my medicine? This drug is given in a hospital or clinic and will not be stored at home. NOTE: This sheet is a summary. It may not cover all possible information. If you have questions about this medicine, talk to your doctor, pharmacist, or health care provider.  2018 Elsevier/Gold Standard (2013-07-01 13:26:01)   Body Ringworm Body ringworm  is an infection of the skin that often causes a ring-shaped rash. Body ringworm can affect any part of your skin. It can spread easily to others. Body ringworm is also called  tinea corporis. What are the causes? This condition is caused by funguses called dermatophytes. The condition develops when these funguses grow out of control on the skin. You can get this condition if you touch a person or animal that has it. You can also get it if you share clothing, bedding, towels, or any other object with an infected person or pet. What increases the risk? This condition is more likely to develop in:  Athletes who often make skin-to-skin contact with other athletes, such as wrestlers.  People who share equipment and mats.  People with a weakened immune system.  What are the signs or symptoms? Symptoms of this condition include:  Itchy, raised red spots and bumps.  Red scaly patches.  A ring-shaped rash. The rash may have: ? A clear center. ? Scales or red bumps at its center. ? Redness near its borders. ? Dry and scaly skin on or around it.  How is this diagnosed? This condition can usually be diagnosed with a skin exam. A skin scraping may be taken from the affected area and examined under a microscope to see if the fungus is present. How is this treated? This condition may be treated with:  An antifungal cream or ointment.  An antifungal shampoo.  Antifungal medicines. These may be prescribed if your ringworm is severe, keeps coming back, or lasts a long time.  Follow these instructions at home:  Take over-the-counter and prescription medicines only as told by your health care provider.  If you were given an antifungal cream or ointment: ? Use it as told by your health care provider. ? Wash the infected area and dry it completely before applying the cream or ointment.  If you were given an antifungal shampoo: ? Use it as told by your health care provider. ? Leave the shampoo on your body for 3-5 minutes before rinsing.  While you have a rash: ? Wear loose clothing to stop clothes from rubbing and irritating it. ? Wash or change your bed  sheets every night.  If your pet has the same infection, take your pet to see a Animal nutritionist. How is this prevented?  Practice good hygiene.  Wear sandals or shoes in public places and showers.  Do not share personal items with others.  Avoid touching red patches of skin on other people.  Avoid touching pets that have bald spots.  If you touch an animal that has a bald spot, wash your hands. Contact a health care provider if:  Your rash continues to spread after 7 days of treatment.  Your rash is not gone in 4 weeks.  The area around your rash gets red, warm, tender, and swollen. This information is not intended to replace advice given to you by your health care provider. Make sure you discuss any questions you have with your health care provider. Document Released: 02/26/2000 Document Revised: 08/06/2015 Document Reviewed: 12/25/2014 Elsevier Interactive Patient Education  [employer].

## 2017-10-04 LAB — COMPLETE METABOLIC PANEL WITH GFR
AG Ratio: 1.9 (calc) (ref 1.0–2.5)
ALBUMIN MSPROF: 4.3 g/dL (ref 3.6–5.1)
ALT: 13 U/L (ref 6–29)
AST: 14 U/L (ref 10–35)
Alkaline phosphatase (APISO): 36 U/L (ref 33–115)
BUN: 15 mg/dL (ref 7–25)
CO2: 25 mmol/L (ref 20–32)
CREATININE: 0.66 mg/dL (ref 0.50–1.10)
Calcium: 9.5 mg/dL (ref 8.6–10.2)
Chloride: 106 mmol/L (ref 98–110)
GFR, Est African American: 120 mL/min/{1.73_m2} (ref >=60)
GFR, Est Non African American: 104 mL/min/{1.73_m2} (ref >=60)
GLOBULIN: 2.3 g/dL (ref 1.9–3.7)
Glucose, Bld: 95 mg/dL (ref 65–99)
Potassium: 4.4 mmol/L (ref 3.5–5.3)
SODIUM: 140 mmol/L (ref 135–146)
TOTAL PROTEIN: 6.6 g/dL (ref 6.1–8.1)
Total Bilirubin: 0.8 mg/dL (ref 0.2–1.2)

## 2017-10-04 LAB — MEASLES/MUMPS/RUBELLA IMMUNITY: RUBELLA: 1.45 {index}

## 2017-10-10 ENCOUNTER — Ambulatory Visit
Admission: RE | Admit: 2017-10-10 | Discharge: 2017-10-10 | Disposition: A | Discharge status: Home / Self Care | Payer: 59 | Source: Ambulatory Visit | Attending: Internal Medicine | Admitting: Internal Medicine

## 2017-10-10 DIAGNOSIS — N6002 Solitary cyst of left breast: Secondary | ICD-10-CM | POA: Insufficient documentation

## 2017-10-11 ENCOUNTER — Other Ambulatory Visit: Payer: Self-pay | Admitting: Internal Medicine

## 2017-10-11 DIAGNOSIS — M62838 Other muscle spasm: Secondary | ICD-10-CM

## 2017-10-11 MED ORDER — METHOCARBAMOL 750 MG PO TABS: 750.0000 mg | ORAL_TABLET | Freq: Four times a day (QID) | ORAL | 1 refills | Status: DC | PRN

## 2017-10-17 ENCOUNTER — Ambulatory Visit (INDEPENDENT_AMBULATORY_CARE_PROVIDER_SITE_OTHER): Payer: 59 | Admitting: Psychology

## 2017-10-17 DIAGNOSIS — F419 Anxiety disorder, unspecified: Secondary | ICD-10-CM

## 2017-10-17 DIAGNOSIS — F329 Major depressive disorder, single episode, unspecified: Secondary | ICD-10-CM | POA: Diagnosis not present

## 2017-10-24 ENCOUNTER — Encounter: Payer: Self-pay | Admitting: Internal Medicine

## 2017-10-24 NOTE — Telephone Encounter (Signed)
See other my chart message for response.  Duplicate.

## 2017-10-24 NOTE — Telephone Encounter (Signed)
I tried calling pt.  Unable to reach.  Left her a message.  Please call her and let her know that Dr Olivia Mackie is out of the office.  If desires to start cholesterol medication, can start crestor 10mg  q day.  Will need liver panel checked 6 weeks after starting the medication.  Please schedule non fasting lab appt in 6 weeks.  If she wants to wait until Dr Olivia Mackie returns to discuss - ok.

## 2017-10-25 ENCOUNTER — Telehealth: Payer: Self-pay | Admitting: Internal Medicine

## 2017-10-25 NOTE — Telephone Encounter (Signed)
FYI

## 2017-10-25 NOTE — Telephone Encounter (Addendum)
Copied from [facility] [id number]. Topic: General - Other >> Oct 25, 2017 11:24 AM Lennox Solders wrote: Reason for CRM: pt is calling and she would like to wait until dr mclean-scocuzza return before starting on chole med. Pt is aware md will be in office on 11-06-17.  Please refer back to [employer]

## 2017-11-15 ENCOUNTER — Other Ambulatory Visit (INDEPENDENT_AMBULATORY_CARE_PROVIDER_SITE_OTHER): Payer: 59

## 2017-11-15 DIAGNOSIS — B354 Tinea corporis: Secondary | ICD-10-CM

## 2017-11-15 LAB — HEPATIC FUNCTION PANEL
ALK PHOS: 30 U/L — AB (ref 39–117)
ALT: 13 U/L (ref 0–35)
AST: 15 U/L (ref 0–37)
Albumin: 3.9 g/dL (ref 3.5–5.2)
BILIRUBIN TOTAL: 0.6 mg/dL (ref 0.2–1.2)
Bilirubin, Direct: 0.1 mg/dL (ref 0.0–0.3)
Total Protein: 6.5 g/dL (ref 6.0–8.3)

## 2017-11-28 ENCOUNTER — Ambulatory Visit (INDEPENDENT_AMBULATORY_CARE_PROVIDER_SITE_OTHER): Payer: 59 | Admitting: Psychology

## 2017-11-28 DIAGNOSIS — F329 Major depressive disorder, single episode, unspecified: Secondary | ICD-10-CM | POA: Diagnosis not present

## 2017-11-28 DIAGNOSIS — F419 Anxiety disorder, unspecified: Secondary | ICD-10-CM

## 2017-12-01 ENCOUNTER — Other Ambulatory Visit: Payer: Self-pay | Admitting: Family Medicine

## 2017-12-01 DIAGNOSIS — M25559 Pain in unspecified hip: Secondary | ICD-10-CM

## 2017-12-04 ENCOUNTER — Encounter: Payer: Self-pay | Admitting: Family Medicine

## 2017-12-05 ENCOUNTER — Ambulatory Visit (INDEPENDENT_AMBULATORY_CARE_PROVIDER_SITE_OTHER): Payer: 59 | Admitting: Internal Medicine

## 2017-12-05 ENCOUNTER — Encounter: Payer: Self-pay | Admitting: Internal Medicine

## 2017-12-05 VITALS — BP 120/86 | HR 64 | Temp 99.0°F | Ht 63.0 in | Wt 150.8 lb

## 2017-12-05 DIAGNOSIS — J069 Acute upper respiratory infection, unspecified: Secondary | ICD-10-CM

## 2017-12-05 DIAGNOSIS — J329 Chronic sinusitis, unspecified: Secondary | ICD-10-CM

## 2017-12-05 DIAGNOSIS — H81399 Other peripheral vertigo, unspecified ear: Secondary | ICD-10-CM

## 2017-12-05 DIAGNOSIS — R110 Nausea: Secondary | ICD-10-CM

## 2017-12-05 DIAGNOSIS — M5412 Radiculopathy, cervical region: Secondary | ICD-10-CM | POA: Diagnosis not present

## 2017-12-05 DIAGNOSIS — R42 Dizziness and giddiness: Secondary | ICD-10-CM

## 2017-12-05 MED ORDER — DOXYCYCLINE HYCLATE 100 MG PO TABS: 100.0000 mg | ORAL_TABLET | Freq: Two times a day (BID) | ORAL | 0 refills | Status: DC

## 2017-12-05 MED ORDER — MECLIZINE HCL 12.5 MG PO TABS: 12.5000 mg | ORAL_TABLET | Freq: Three times a day (TID) | ORAL | 0 refills | Status: DC | PRN

## 2017-12-05 NOTE — Progress Notes (Signed)
Pre visit review using our clinic review tool, if applicable. No additional management support is needed unless otherwise documented below in the visit note. 

## 2017-12-05 NOTE — Progress Notes (Signed)
Chief Complaint  Patient presents with  . Dizziness   F/u  1. Dizziness noted last weds lasting 15 minutes room was spinning h/o vertigo. She stayed out of work weds due to being dizzy. Sx's lasted Friday, Sat, Sunday and Monday better. She did have some ear ringing with the dizziness but no hearing loss and has had some sinus pressure and nausea  2. Neck pain with left arm upper arm numbness unable to lie on her side which she likes to do with sleeping. ROM limited with extension of neck and looking to the left  3. C/o sneezing and sinus pressure new    Review of Systems  Constitutional: Negative for weight loss.  HENT: Positive for sinus pain and tinnitus.   Respiratory: Negative for cough and shortness of breath.   Cardiovascular: Negative for chest pain.  Gastrointestinal: Positive for nausea. Negative for vomiting.  Musculoskeletal: Positive for neck pain.  Skin: Negative for rash.  Neurological: Positive for dizziness and sensory change.   Past Medical History:  Diagnosis Date  . Anxiety   . Asthma   . Carpal tunnel syndrome    R hand   . Colon polyps   . Depression   . Eosinophilic esophagitis    noted in [place] with GI path report 09/29/14   . Esophageal stricture    s/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 Edwin Cap   . Herniated disc, cervical    s/p epidural injection  . Hyperlipidemia   . Low back pain   . Migraines    Past Surgical History:  Procedure Laterality Date  . CESAREAN SECTION    . COLONOSCOPY WITH PROPOFOL N/A 03/27/2017   Procedure: COLONOSCOPY WITH PROPOFOL;  Surgeon: Jonathon Bellows, MD;  Location: [facility] ENDOSCOPY;  Service: Gastroenterology;  Laterality: N/A;  . ESOPHAGOGASTRODUODENOSCOPY    . ESOPHAGOGASTRODUODENOSCOPY (EGD) WITH PROPOFOL N/A 03/27/2017   Procedure: ESOPHAGOGASTRODUODENOSCOPY (EGD) WITH PROPOFOL WITH DILATION;  Surgeon: Jonathon Bellows, MD;  Location: [facility] ENDOSCOPY;  Service: Gastroenterology;  Laterality: N/A;  .  THROAT SURGERY  2015  . UMBILICAL HERNIA REPAIR  2006    x 2   w mesh per pt   Family History  Problem Relation Age of Onset  . Breast cancer Mother   . Thyroid disease Mother   . Colon polyps Mother   . Prostate cancer Father   . Cancer - Prostate Father   . Breast cancer Maternal Grandmother    Social History   Socioeconomic History  . Marital status: Single    Spouse name: Not on file  . Number of children: Not on file  . Years of education: Not on file  . Highest education level: Not on file  Occupational History  . Not on file  Social Needs  . Financial resource strain: Not on file  . Food insecurity:    Worry: Not on file    Inability: Not on file  . Transportation needs:    Medical: Not on file    Non-medical: Not on file  Tobacco Use  . Smoking status: Former Smoker    Packs/day: 1.00    Years: 20.00    Pack years: 20.00    Last attempt to quit: 06/02/2006    Years since quitting: 11.5  . Smokeless tobacco: Never Used  Substance and Sexual Activity  . Alcohol use: Yes    Alcohol/week: 2.0 - 3.0 standard drinks    Types: 2 - 3 Standard drinks or equivalent per week  Comment: socially  . Drug use: No  . Sexual activity: Yes    Partners: Male  Lifestyle  . Physical activity:    Days per week: Not on file    Minutes per session: Not on file  . Stress: Not on file  Relationships  . Social connections:    Talks on phone: Not on file    Gets together: Not on file    Attends religious service: Not on file    Active member of club or organization: Not on file    Attends meetings of clubs or organizations: Not on file    Relationship status: Not on file  . Intimate partner violence:    Fear of current or ex partner: Not on file    Emotionally abused: Not on file    Physically abused: Not on file    Forced sexual activity: Not on file  Other Topics Concern  . Not on file  Social History Narrative   Works in retail    2 kids 22 and 39 son and daughter  live in [place]. As of 02/26/17    Divorced.    Former smoker 20+ years quit in 1997 then started again for 5 years and quit 10-12 years ago as of 03/08/17    Current Meds  Medication Sig  . Acetaminophen (TYLENOL GO TABS EXTRA STRENGTH PO) Take by mouth every 8 (eight) hours.  . fluconazole (DIFLUCAN) 150 MG tablet Take 1 tablet (150 mg total) by mouth once a week.  Marland Kitchen ibuprofen (ADVIL,MOTRIN) 800 MG tablet Take 1 tablet (800 mg total) by mouth every 8 (eight) hours as needed (use sparingly).  Marland Kitchen ketoconazole (NIZORAL) 2 % cream Apply 1 application topically daily.  . methocarbamol (ROBAXIN) 750 MG tablet Take by mouth.  . methocarbamol (ROBAXIN) 750 MG tablet Take 1 tablet (750 mg total) by mouth every 6 (six) hours as needed for muscle spasms.  Marland Kitchen omega-3 acid ethyl esters (LOVAZA) 1 g capsule Take 2 capsules (2 g total) by mouth 2 (two) times daily.  Marland Kitchen omeprazole (PRILOSEC) 40 MG capsule Take 1 capsule (40 mg total) by mouth daily.  . valACYclovir (VALTREX) 1000 MG tablet Take 1 tablet (1,000 mg total) by mouth daily. X 5 days with outbreak as needed   Allergies  Allergen Reactions  . Penicillins   . Sulfa Antibiotics    Recent Results (from the past 2160 hour(s))  COMPLETE METABOLIC PANEL WITH GFR     Status: None   Collection Time: 10/03/17  8:37 AM  Result Value Ref Range   Glucose, Bld 95 65 - 99 mg/dL    Comment: .            Fasting reference interval .    BUN 15 7 - 25 mg/dL   Creat 0.66 0.50 - 1.10 mg/dL   GFR, Est Non African American 104 > OR = 60 mL/min/1.63m   GFR, Est African American 120 > OR = 60 mL/min/1.711m  BUN/Creatinine Ratio NOT APPLICABLE 6 - 22 (calc)   Sodium 140 135 - 146 mmol/L   Potassium 4.4 3.5 - 5.3 mmol/L   Chloride 106 98 - 110 mmol/L   CO2 25 20 - 32 mmol/L   Calcium 9.5 8.6 - 10.2 mg/dL   Total Protein 6.6 6.1 - 8.1 g/dL   Albumin 4.3 3.6 - 5.1 g/dL   Globulin 2.3 1.9 - 3.7 g/dL (calc)   AG Ratio 1.9 1.0 - 2.5 (calc)   Total Bilirubin 0.8  0.2 - 1.2 mg/dL   Alkaline phosphatase (APISO) 36 33 - 115 U/L   AST 14 10 - 35 U/L   ALT 13 6 - 29 U/L  Lipid panel     Status: Abnormal   Collection Time: 10/03/17  8:37 AM  Result Value Ref Range   Cholesterol 252 (H) 0 - 200 mg/dL    Comment: ATP III Classification       Desirable:  < 200 mg/dL               Borderline High:  200 - 239 mg/dL          High:  > = 240 mg/dL   Triglycerides 82.0 0.0 - 149.0 mg/dL    Comment: Normal:  <150 mg/dLBorderline High:  150 - 199 mg/dL   HDL 61.70 >39.00 mg/dL   VLDL 16.4 0.0 - 40.0 mg/dL   LDL Cholesterol 174 (H) 0 - 99 mg/dL   Total CHOL/HDL Ratio 4     Comment:                Men          Women1/2 Average Risk     3.4          3.3Average Risk          5.0          4.42X Average Risk          9.6          7.13X Average Risk          15.0          11.0                       NonHDL 190.09     Comment: NOTE:  Non-HDL goal should be 30 mg/dL higher than patient's LDL goal (i.e. LDL goal of < 70 mg/dL, would have non-HDL goal of < 100 mg/dL)  Measles/Mumps/Rubella Immunity     Status: Abnormal   Collection Time: 10/03/17  8:37 AM  Result Value Ref Range   Rubeola IgG >300.00 AU/mL    Comment: AU/mL            Interpretation -----            -------------- <25.00           Negative 25.00-29.99      Equivocal >29.99           Positive . A positive result indicates that the patient has antibody to measles virus. It does not differentiate  between an active or past infection. The clinical  diagnosis must be interpreted in conjunction with  clinical signs and symptoms of the patient.    Mumps IgG <9.00 (L) AU/mL    Comment:  AU/mL           Interpretation -------         ---------------- <9.00             Negative 9.00-10.99        Equivocal >10.99            Positive A positive result indicates that the patient has  antibody to mumps virus. It does not differentiate between an  active or past infection. The clinical diagnosis must be  interpreted in conjunction with clinical signs and symptoms of the patient. .    Rubella 1.45 index    Comment:     Index  Interpretation     -----            --------------       <0.90            Not consistent with Immunity     0.90-0.99        Equivocal     > or = 1.00      Consistent with Immunity  . The presence of rubella IgG antibody suggests  immunization or past or current infection with rubella virus.   Hepatic function panel     Status: Abnormal   Collection Time: 11/15/17  7:57 AM  Result Value Ref Range   Total Bilirubin 0.6 0.2 - 1.2 mg/dL   Bilirubin, Direct 0.1 0.0 - 0.3 mg/dL   Alkaline Phosphatase 30 (L) 39 - 117 U/L   AST 15 0 - 37 U/L   ALT 13 0 - 35 U/L   Total Protein 6.5 6.0 - 8.3 g/dL   Albumin 3.9 3.5 - 5.2 g/dL   Objective  Body mass index is 26.71 kg/m. Wt Readings from Last 3 Encounters:  12/05/17 150 lb 12.8 oz (68.4 kg)  10/03/17 151 lb 6 oz (68.7 kg)  09/21/17 152 lb 2 oz (69 kg)   Temp Readings from Last 3 Encounters:  12/05/17 99 F (37.2 C) (Oral)  10/03/17 98.1 F (36.7 C) (Oral)  09/21/17 98.1 F (36.7 C) (Oral)   BP Readings from Last 3 Encounters:  12/05/17 120/86  10/03/17 108/82  09/21/17 110/74   Pulse Readings from Last 3 Encounters:  12/05/17 64  10/03/17 65  09/21/17 76    Physical Exam  Constitutional: She is oriented to person, place, and time. Vital signs are normal. She appears well-developed and well-nourished. She is cooperative.  HENT:  Head: Normocephalic and atraumatic.  Nose: Right sinus exhibits maxillary sinus tenderness and frontal sinus tenderness. Left sinus exhibits maxillary sinus tenderness and frontal sinus tenderness.  Mouth/Throat: Oropharynx is clear and moist and mucous membranes are normal.  Eyes: Pupils are equal, round, and reactive to light. Conjunctivae are normal.  Cardiovascular: Normal rate, regular rhythm and normal heart sounds.  Pulmonary/Chest: Effort normal and breath  sounds normal.  Neurological: She is alert and oriented to person, place, and time. Gait normal.  Skin: Skin is warm, dry and intact.  Psychiatric: She has a normal mood and affect. Her speech is normal and behavior is normal. Judgment and thought content normal. Cognition and memory are normal.  Nursing note and vitals reviewed.   Assessment   1. Dizziness ddx sinusitis, bppv, r/o other  2. Cervical radiculopathy with abnormal Xray 09/2017  3. URI/sinusitis  4. HM Plan   1. Will order MRI brain open in [place]  tx #3  Trial of meclizine  Disc consider vesticular rehab in future  2. MRI C spine [place]  3.  Supportive care  Doxy bid x 7 days Does not like nasal sprays  Try generic claritin qhs  4.   Had flu shot due this year  Tdap see last visit 03/08/17 UTD had Boostrix 08/17/15  rec hep B vaccine will call back to sch  rec MMR vaccine    EGD/colonoscopy had 03/27/17 Dr. Bailey Mech diverticulosis, serrated polyps neg EGD for eosinophillic esophagitis repeat colonoscopy due in 5years  mammo 10/10/17 negative FH breast cancer in mom and m gm in 12s. consider genetic testing in future  Pap 04/04/17 h/o abnormal neg pap neg HPV Former smoker congratulated on quitting  Provider: Dr.  Olivia Mackie McLean-Scocuzza-[facility] Medicine

## 2017-12-05 NOTE — Patient Instructions (Signed)
[facility]  [address]  [phone]   Zyrtec or clartin at night for sneezing  Flonase stuffy nose or steam from the shower or bowel of water  Warm tea with honey and lemon Warm salt water gargles  mucinex DM green label or robitussin dm  Sinusitis, Adult Sinusitis is soreness and inflammation of your sinuses. Sinuses are hollow spaces in the bones around your face. Your sinuses are located:  Around your eyes.  In the middle of your forehead.  Behind your nose.  In your cheekbones.  Your sinuses and nasal passages are lined with a stringy fluid (mucus). Mucus normally drains out of your sinuses. When your nasal tissues become inflamed or swollen, the mucus can become trapped or blocked so air cannot flow through your sinuses. This allows bacteria, viruses, and funguses to grow, which leads to infection. Sinusitis can develop quickly and last for 7?10 days (acute) or for more than 12 weeks (chronic). Sinusitis often develops after a cold. What are the causes? This condition is caused by anything that creates swelling in the sinuses or stops mucus from draining, including:  Allergies.  Asthma.  Bacterial or viral infection.  Abnormally shaped bones between the nasal passages.  Nasal growths that contain mucus (nasal polyps).  Narrow sinus openings.  Pollutants, such as chemicals or irritants in the air.  A foreign object stuck in the nose.  A fungal infection. This is rare.  What increases the risk? The following factors may make you more likely to develop this condition:  Having allergies or asthma.  Having had a recent cold or respiratory tract infection.  Having structural deformities or blockages in your nose or sinuses.  Having a weak immune system.  Doing a lot of swimming or diving.  Overusing nasal sprays.  Smoking.  What are the signs or symptoms? The main symptoms of this condition are pain and a feeling of pressure around the  affected sinuses. Other symptoms include:  Upper toothache.  Earache.  Headache.  Bad breath.  Decreased sense of smell and taste.  A cough that may get worse at night.  Fatigue.  Fever.  Thick drainage from your nose. The drainage is often green and it may contain pus (purulent).  Stuffy nose or congestion.  Postnasal drip. This is when extra mucus collects in the throat or back of the nose.  Swelling and warmth over the affected sinuses.  Sore throat.  Sensitivity to light.  How is this diagnosed? This condition is diagnosed based on symptoms, a medical history, and a physical exam. To find out if your condition is acute or chronic, your health care provider may:  Look in your nose for signs of nasal polyps.  Tap over the affected sinus to check for signs of infection.  View the inside of your sinuses using an imaging device that has a light attached (endoscope).  If your health care provider suspects that you have chronic sinusitis, you may also:  Be tested for allergies.  Have a sample of mucus taken from your nose (nasal culture) and checked for bacteria.  Have a mucus sample examined to see if your sinusitis is related to an allergy.  If your sinusitis does not respond to treatment and it lasts longer than 8 weeks, you may have an MRI or CT scan to check your sinuses. These scans also help to determine how severe your infection is. In rare cases, a bone biopsy may be done to rule out more serious  types of fungal sinus disease. How is this treated? Treatment for sinusitis depends on the cause and whether your condition is chronic or acute. If a virus is causing your sinusitis, your symptoms will go away on their own within 10 days. You may be given medicines to relieve your symptoms, including:  Topical nasal decongestants. They shrink swollen nasal passages and let mucus drain from your sinuses.  Antihistamines. These drugs block inflammation that is  triggered by allergies. This can help to ease swelling in your nose and sinuses.  Topical nasal corticosteroids. These are nasal sprays that ease inflammation and swelling in your nose and sinuses.  Nasal saline washes. These rinses can help to get rid of thick mucus in your nose.  If your condition is caused by bacteria, you will be given an antibiotic medicine. If your condition is caused by a fungus, you will be given an antifungal medicine. Surgery may be needed to correct underlying conditions, such as narrow nasal passages. Surgery may also be needed to remove polyps. Follow these instructions at home: Medicines  Take, use, or apply over-the-counter and prescription medicines only as told by your health care provider. These may include nasal sprays.  If you were prescribed an antibiotic medicine, take it as told by your health care provider. Do not stop taking the antibiotic even if you start to feel better. Hydrate and Humidify  Drink enough water to keep your urine clear or pale yellow. Staying hydrated will help to thin your mucus.  Use a cool mist humidifier to keep the humidity level in your home above 50%.  Inhale steam for 10-15 minutes, 3-4 times a day or as told by your health care provider. You can do this in the bathroom while a hot shower is running.  Limit your exposure to cool or dry air. Rest  Rest as much as possible.  Sleep with your head raised (elevated).  Make sure to get enough sleep each night. General instructions  Apply a warm, moist washcloth to your face 3-4 times a day or as told by your health care provider. This will help with discomfort.  Wash your hands often with soap and water to reduce your exposure to viruses and other germs. If soap and water are not available, use hand sanitizer.  Do not smoke. Avoid being around people who are smoking (secondhand smoke).  Keep all follow-up visits as told by your health care provider. This is  important. Contact a health care provider if:  You have a fever.  Your symptoms get worse.  Your symptoms do not improve within 10 days. Get help right away if:  You have a severe headache.  You have persistent vomiting.  You have pain or swelling around your face or eyes.  You have vision problems.  You develop confusion.  Your neck is stiff.  You have trouble breathing. This information is not intended to replace advice given to you by your health care provider. Make sure you discuss any questions you have with your health care provider. Document Released: 02/28/2005 Document Revised: 10/25/2015 Document Reviewed: 12/24/2014 Elsevier Interactive Patient Education  2018 [employer].   Dizziness Dizziness is a common problem. It is a feeling of unsteadiness or light-headedness. You may feel like you are about to faint. Dizziness can lead to injury if you stumble or fall. Anyone can become dizzy, but dizziness is more common in older adults. This condition can be caused by a number of things, including medicines, dehydration, or  illness. Follow these instructions at home:  Eating and drinking  Drink enough fluid to keep your urine clear or pale yellow. This helps to keep you from becoming dehydrated. Try to drink more clear fluids, such as water.  Do not drink alcohol.  Limit your caffeine intake if told to do so by your health care provider. Check ingredients and nutrition facts to see if a food or beverage contains caffeine.  Limit your salt (sodium) intake if told to do so by your health care provider. Check ingredients and nutrition facts to see if a food or beverage contains sodium. Activity  Avoid making quick movements. ? Rise slowly from chairs and steady yourself until you feel okay. ? In the morning, first sit up on the side of the bed. When you feel okay, stand slowly while you hold onto something until you know that your balance is fine.  If you need to  stand in one place for a long time, move your legs often. Tighten and relax the muscles in your legs while you are standing.  Do not drive or use heavy machinery if you feel dizzy.  Avoid bending down if you feel dizzy. Place items in your home so that they are easy for you to reach without leaning over. Lifestyle  Do not use any products that contain nicotine or tobacco, such as cigarettes and e-cigarettes. If you need help quitting, ask your health care provider.  Try to reduce your stress level by using methods such as yoga or meditation. Talk with your health care provider if you need help to manage your stress. General instructions  Watch your dizziness for any changes.  Take over-the-counter and prescription medicines only as told by your health care provider. Talk with your health care provider if you think that your dizziness is caused by a medicine that you are taking.  Tell a friend or a family member that you are feeling dizzy. If he or she notices any changes in your behavior, have this person call your health care provider.  Keep all follow-up visits as told by your health care provider. This is important. Contact a health care provider if:  Your dizziness does not go away.  Your dizziness or light-headedness gets worse.  You feel nauseous.  You have reduced hearing.  You have new symptoms.  You are unsteady on your feet or you feel like the room is spinning. Get help right away if:  You vomit or have diarrhea and are unable to eat or drink anything.  You have problems talking, walking, swallowing, or using your arms, hands, or legs.  You feel generally weak.  You are not thinking clearly or you have trouble forming sentences. It may take a friend or family member to notice this.  You have chest pain, abdominal pain, shortness of breath, or sweating.  Your vision changes.  You have any bleeding.  You have a severe headache.  You have neck pain or a stiff  neck.  You have a fever. These symptoms may represent a serious problem that is an emergency. Do not wait to see if the symptoms will go away. Get medical help right away. Call your local emergency services (911 in the [place] Do not drive yourself to the hospital. Summary  Dizziness is a feeling of unsteadiness or light-headedness. This condition can be caused by a number of things, including medicines, dehydration, or illness.  Anyone can become dizzy, but dizziness is more common in older adults.  Drink enough fluid to keep your urine clear or pale yellow. Do not drink alcohol.  Avoid making quick movements if you feel dizzy. Monitor your dizziness for any changes. This information is not intended to replace advice given to you by your health care provider. Make sure you discuss any questions you have with your health care provider. Document Released: 08/24/2000 Document Revised: 04/02/2016 Document Reviewed: 04/02/2016 Elsevier Interactive Patient Education  2018 [employer].  Cervical Radiculopathy Cervical radiculopathy happens when a nerve in the neck (cervical nerve) is pinched or bruised. This condition can develop because of an injury or as part of the normal aging process. Pressure on the cervical nerves can cause pain or numbness that runs from the neck all the way down into the arm and fingers. Usually, this condition gets better with rest. Treatment may be needed if the condition does not improve. What are the causes? This condition may be caused by:  Injury.  Slipped (herniated) disk.  Muscle tightness in the neck because of overuse.  Arthritis.  Breakdown or degeneration in the bones and joints of the spine (spondylosis) due to aging.  Bone spurs that may develop near the cervical nerves.  What are the signs or symptoms? Symptoms of this condition include:  Pain that runs from the neck to the arm and hand. The pain can be severe or irritating. It may be worse  when the neck is moved.  Numbness or weakness in the affected arm and hand.  How is this diagnosed? This condition may be diagnosed based on symptoms, medical history, and a physical exam. You may also have tests, including:  X-rays.  CT scan.  MRI.  Electromyogram (EMG).  Nerve conduction tests.  How is this treated? In many cases, treatment is not needed for this condition. With rest, the condition usually gets better over time. If treatment is needed, options may include:  Wearing a soft neck collar for short periods of time.  Physical therapy to strengthen your neck muscles.  Medicines, such as NSAIDs, oral corticosteroids, or spinal injections.  Surgery. This may be needed if other treatments do not help. Various types of surgery may be done depending on the cause of your problems.  Follow these instructions at home: Managing pain  Take over-the-counter and prescription medicines only as told by your health care provider.  If directed, apply ice to the affected area. ? Put ice in a plastic bag. ? Place a towel between your skin and the bag. ? Leave the ice on for 20 minutes, 2-3 times per day.  If ice does not help, you can try using heat. Take a warm shower or warm bath, or use a heat pack as told by your health care provider.  Try a gentle neck and shoulder massage to help relieve symptoms. Activity  Rest as needed. Follow instructions from your health care provider about any restrictions on activities.  Do stretching and strengthening exercises as told by your health care provider or physical therapist. General instructions  If you were given a soft collar, wear it as told by your health care provider.  Use a flat pillow when you sleep.  Keep all follow-up visits as told by your health care provider. This is important. Contact a health care provider if:  Your condition does not improve with treatment. Get help right away if:  Your pain gets much worse  and cannot be controlled with medicines.  You have weakness or numbness in your hand, arm, face,  or leg.  You have a high fever.  You have a stiff, rigid neck.  You lose control of your bowels or your bladder (have incontinence).  You have trouble with walking, balance, or speaking. This information is not intended to replace advice given to you by your health care provider. Make sure you discuss any questions you have with your health care provider. Document Released: 11/23/2000 Document Revised: 08/06/2015 Document Reviewed: 04/24/2014 Elsevier Interactive Patient Education  [employer].

## 2017-12-12 ENCOUNTER — Other Ambulatory Visit: Payer: Self-pay | Admitting: Internal Medicine

## 2017-12-12 DIAGNOSIS — M62838 Other muscle spasm: Secondary | ICD-10-CM

## 2017-12-12 MED ORDER — METHOCARBAMOL 750 MG PO TABS: 750.0000 mg | ORAL_TABLET | Freq: Four times a day (QID) | ORAL | 5 refills | Status: DC | PRN

## 2017-12-19 ENCOUNTER — Ambulatory Visit
Admission: RE | Admit: 2017-12-19 | Discharge: 2017-12-19 | Disposition: A | Discharge status: Home / Self Care | Payer: 59 | Source: Ambulatory Visit | Attending: Internal Medicine | Admitting: Internal Medicine

## 2017-12-19 DIAGNOSIS — M5412 Radiculopathy, cervical region: Secondary | ICD-10-CM

## 2017-12-19 DIAGNOSIS — H81399 Other peripheral vertigo, unspecified ear: Secondary | ICD-10-CM

## 2017-12-19 DIAGNOSIS — R110 Nausea: Secondary | ICD-10-CM

## 2017-12-20 ENCOUNTER — Other Ambulatory Visit: Payer: Self-pay | Admitting: Internal Medicine

## 2017-12-20 DIAGNOSIS — G8929 Other chronic pain: Secondary | ICD-10-CM

## 2017-12-20 DIAGNOSIS — M542 Cervicalgia: Principal | ICD-10-CM

## 2017-12-20 DIAGNOSIS — R937 Abnormal findings on diagnostic imaging of other parts of musculoskeletal system: Secondary | ICD-10-CM

## 2017-12-28 ENCOUNTER — Other Ambulatory Visit: Payer: Self-pay | Admitting: Internal Medicine

## 2017-12-28 DIAGNOSIS — M545 Low back pain, unspecified: Secondary | ICD-10-CM

## 2017-12-28 DIAGNOSIS — G8929 Other chronic pain: Secondary | ICD-10-CM

## 2017-12-28 MED ORDER — IBUPROFEN 800 MG PO TABS: 800.0000 mg | ORAL_TABLET | Freq: Three times a day (TID) | ORAL | 2 refills | Status: DC | PRN

## 2018-01-03 ENCOUNTER — Ambulatory Visit (INDEPENDENT_AMBULATORY_CARE_PROVIDER_SITE_OTHER): Payer: 59 | Admitting: Internal Medicine

## 2018-01-03 ENCOUNTER — Encounter: Payer: Self-pay | Admitting: Internal Medicine

## 2018-01-03 VITALS — BP 118/78 | HR 82 | Temp 98.2°F | Resp 16 | Ht 63.0 in | Wt 158.1 lb

## 2018-01-03 DIAGNOSIS — M5412 Radiculopathy, cervical region: Secondary | ICD-10-CM

## 2018-01-03 DIAGNOSIS — G4700 Insomnia, unspecified: Secondary | ICD-10-CM

## 2018-01-03 DIAGNOSIS — R0981 Nasal congestion: Secondary | ICD-10-CM

## 2018-01-03 DIAGNOSIS — Z23 Encounter for immunization: Secondary | ICD-10-CM

## 2018-01-03 DIAGNOSIS — J309 Allergic rhinitis, unspecified: Secondary | ICD-10-CM

## 2018-01-03 DIAGNOSIS — F419 Anxiety disorder, unspecified: Secondary | ICD-10-CM

## 2018-01-03 MED ORDER — TRAZODONE HCL 50 MG PO TABS: 50.0000 mg | ORAL_TABLET | Freq: Every evening | ORAL | 0 refills | Status: DC | PRN

## 2018-01-03 MED ORDER — FLUTICASONE PROPIONATE 50 MCG/ACT NA SUSP: 2.0000 | Freq: Every day | NASAL | 12 refills | Status: DC

## 2018-01-03 NOTE — Progress Notes (Addendum)
Chief Complaint  Patient presents with  . Follow-up   F/u  1. C/o insomnia sleeping 3-4.5 hrs at night and having trouble staying asleep. Also driving recently she had issues with panic and anxiety and she is unsure the trigger see is f/u with Alene Mires therapy here and declines to try medications for anxiety/panic for now  2. Cervical radiculopathy with abnormal MRI 12/19/17 f/u with NS 01/23/18 at 8:30 am  3. C/o nasal congestion taking otc benadryl and generic claritin    Review of Systems  Constitutional: Negative for weight loss.  HENT: Positive for congestion. Negative for hearing loss.   Eyes: Negative for blurred vision.  Cardiovascular: Negative for chest pain.  Gastrointestinal: Negative for abdominal pain.  Musculoskeletal: Positive for neck pain.  Skin: Negative for rash.  Neurological: Positive for sensory change.  Psychiatric/Behavioral: The patient is nervous/anxious and has insomnia.    Past Medical History:  Diagnosis Date  . Anxiety   . Asthma   . Carpal tunnel syndrome    R hand   . Colon polyps   . Depression   . Eosinophilic esophagitis    noted in [place] with GI path report 09/29/14   . Esophageal stricture    s/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 Edwin Cap   . Herniated disc, cervical    s/p epidural injection  . Hyperlipidemia   . Low back pain   . Migraines    Past Surgical History:  Procedure Laterality Date  . CESAREAN SECTION    . COLONOSCOPY WITH PROPOFOL N/A 03/27/2017   Procedure: COLONOSCOPY WITH PROPOFOL;  Surgeon: Jonathon Bellows, MD;  Location: [facility] ENDOSCOPY;  Service: Gastroenterology;  Laterality: N/A;  . ESOPHAGOGASTRODUODENOSCOPY    . ESOPHAGOGASTRODUODENOSCOPY (EGD) WITH PROPOFOL N/A 03/27/2017   Procedure: ESOPHAGOGASTRODUODENOSCOPY (EGD) WITH PROPOFOL WITH DILATION;  Surgeon: Jonathon Bellows, MD;  Location: [facility] ENDOSCOPY;  Service: Gastroenterology;  Laterality: N/A;  . THROAT SURGERY  2015  . UMBILICAL HERNIA REPAIR   2006    x 2   w mesh per pt   Family History  Problem Relation Age of Onset  . Breast cancer Mother   . Thyroid disease Mother   . Colon polyps Mother   . Prostate cancer Father   . Cancer - Prostate Father   . Breast cancer Maternal Grandmother    Social History   Socioeconomic History  . Marital status: Single    Spouse name: Not on file  . Number of children: Not on file  . Years of education: Not on file  . Highest education level: Not on file  Occupational History  . Not on file  Social Needs  . Financial resource strain: Not on file  . Food insecurity:    Worry: Not on file    Inability: Not on file  . Transportation needs:    Medical: Not on file    Non-medical: Not on file  Tobacco Use  . Smoking status: Former Smoker    Packs/day: 1.00    Years: 20.00    Pack years: 20.00    Last attempt to quit: 06/02/2006    Years since quitting: 11.5  . Smokeless tobacco: Never Used  Substance and Sexual Activity  . Alcohol use: Yes    Alcohol/week: 2.0 - 3.0 standard drinks    Types: 2 - 3 Standard drinks or equivalent per week    Comment: socially  . Drug use: No  . Sexual activity: Yes    Partners: Male  Lifestyle  .  Physical activity:    Days per week: Not on file    Minutes per session: Not on file  . Stress: Not on file  Relationships  . Social connections:    Talks on phone: Not on file    Gets together: Not on file    Attends religious service: Not on file    Active member of club or organization: Not on file    Attends meetings of clubs or organizations: Not on file    Relationship status: Not on file  . Intimate partner violence:    Fear of current or ex partner: Not on file    Emotionally abused: Not on file    Physically abused: Not on file    Forced sexual activity: Not on file  Other Topics Concern  . Not on file  Social History Narrative   Works in retail    2 kids 64 and 25 son and daughter live in [place]. As of 02/26/17    Divorced.     Former smoker 20+ years quit in 1997 then started again for 5 years and quit 10-12 years ago as of 03/08/17    Current Meds  Medication Sig  . Acetaminophen (TYLENOL GO TABS EXTRA STRENGTH PO) Take by mouth every 8 (eight) hours.  Marland Kitchen ibuprofen (ADVIL,MOTRIN) 800 MG tablet Take 1 tablet (800 mg total) by mouth every 8 (eight) hours as needed (use sparingly).  . meclizine (ANTIVERT) 12.5 MG tablet Take 1 tablet (12.5 mg total) by mouth 3 (three) times daily as needed for dizziness.  . methocarbamol (ROBAXIN) 750 MG tablet Take by mouth.  . methocarbamol (ROBAXIN) 750 MG tablet Take 1 tablet (750 mg total) by mouth every 6 (six) hours as needed for muscle spasms.  Marland Kitchen omeprazole (PRILOSEC) 40 MG capsule Take 1 capsule (40 mg total) by mouth daily.  . valACYclovir (VALTREX) 1000 MG tablet Take 1 tablet (1,000 mg total) by mouth daily. X 5 days with outbreak as needed   Allergies  Allergen Reactions  . Penicillins   . Sulfa Antibiotics    Recent Results (from the past 2160 hour(s))  Hepatic function panel     Status: Abnormal   Collection Time: 11/15/17  7:57 AM  Result Value Ref Range   Total Bilirubin 0.6 0.2 - 1.2 mg/dL   Bilirubin, Direct 0.1 0.0 - 0.3 mg/dL   Alkaline Phosphatase 30 (L) 39 - 117 U/L   AST 15 0 - 37 U/L   ALT 13 0 - 35 U/L   Total Protein 6.5 6.0 - 8.3 g/dL   Albumin 3.9 3.5 - 5.2 g/dL   Objective  Body mass index is 28.01 kg/m. Wt Readings from Last 3 Encounters:  01/03/18 158 lb 2 oz (71.7 kg)  12/05/17 150 lb 12.8 oz (68.4 kg)  10/03/17 151 lb 6 oz (68.7 kg)   Temp Readings from Last 3 Encounters:  01/03/18 98.2 F (36.8 C) (Oral)  12/05/17 99 F (37.2 C) (Oral)  10/03/17 98.1 F (36.7 C) (Oral)   BP Readings from Last 3 Encounters:  01/03/18 118/78  12/05/17 120/86  10/03/17 108/82   Pulse Readings from Last 3 Encounters:  01/03/18 82  12/05/17 64  10/03/17 65    Physical Exam  Constitutional: She is oriented to person, place, and time. Vital  signs are normal. She appears well-developed and well-nourished. She is cooperative.  HENT:  Head: Normocephalic and atraumatic.  Mouth/Throat: Oropharynx is clear and moist and mucous membranes are normal.  Eyes: Pupils  are equal, round, and reactive to light. Conjunctivae are normal.  Cardiovascular: Normal rate, regular rhythm and normal heart sounds.  Pulmonary/Chest: Effort normal and breath sounds normal.  Neurological: She is alert and oriented to person, place, and time. Gait normal.  Skin: Skin is warm, dry and intact.  Psychiatric: She has a normal mood and affect. Her speech is normal and behavior is normal. Judgment and thought content normal. Cognition and memory are normal.  Nursing note and vitals reviewed.   Assessment   1. Cervical radiculopathy abnormal MRI 12/19/17 2. Anxiety/insomnia  3. Allergic rhinitis  4. HM Plan   1. F/u NS Dr. Lacinda Axon 01/23/18  2. Start trazadone 50-100 mg qhs  3. Add flonase  Generic claritin and benadryl  4.  Had flu shot had today  Tdap see last visit 03/08/17 UTD had Boostrix 08/17/15  rec hep B vaccinewill call back to sch  rec MMR vaccine   EGD/colonoscopy had 03/27/17 Dr. Bailey Mech diverticulosis, serrated polyps neg EGD for eosinophillic esophagitis repeat colonoscopy due in 5years  mammo 10/10/17 negative FH breast cancer in mom and m gm in 56s.consider genetic testing in future  Pap1/22/19 h/o abnormal neg pap neg HPV Former smoker congratulated on quitting  NS saw 01/23/18 cervical spinal cord kyphosis, cervical myelopathy f/u in 4 months referral to pm&R for pain tx Dr. Remo Lipps cook  Provider: Dr. Olivia Mackie McLean-Scocuzza-Internal Medicine

## 2018-01-03 NOTE — Patient Instructions (Signed)
Allergic Rhinitis, Adult Allergic rhinitis is an allergic reaction that affects the mucous membrane inside the nose. It causes sneezing, a runny or stuffy nose, and the feeling of mucus going down the back of the throat (postnasal drip). Allergic rhinitis can be mild to severe. There are two types of allergic rhinitis:  Seasonal. This type is also called hay fever. It happens only during certain seasons.  Perennial. This type can happen at any time of the year.  What are the causes? This condition happens when the body's defense system (immune system) responds to certain harmless substances called allergens as though they were germs.  Seasonal allergic rhinitis is triggered by pollen, which can come from grasses, trees, and weeds. Perennial allergic rhinitis may be caused by:  House dust mites.  Pet dander.  Mold spores.  What are the signs or symptoms? Symptoms of this condition include:  Sneezing.  Runny or stuffy nose (nasal congestion).  Postnasal drip.  Itchy nose.  Tearing of the eyes.  Trouble sleeping.  Daytime sleepiness.  How is this diagnosed? This condition may be diagnosed based on:  Your medical history.  A physical exam.  Tests to check for related conditions, such as: ? Asthma. ? Pink eye. ? Ear infection. ? Upper respiratory infection.  Tests to find out which allergens trigger your symptoms. These may include skin or blood tests.  How is this treated? There is no cure for this condition, but treatment can help control symptoms. Treatment may include:  Taking medicines that block allergy symptoms, such as antihistamines. Medicine may be given as a shot, nasal spray, or pill.  Avoiding the allergen.  Desensitization. This treatment involves getting ongoing shots until your body becomes less sensitive to the allergen. This treatment may be done if other treatments do not help.  If taking medicine and avoiding the allergen does not work, new,  stronger medicines may be prescribed.  Follow these instructions at home:  Find out what you are allergic to. Common allergens include smoke, dust, and pollen.  Avoid the things you are allergic to. These are some things you can do to help avoid allergens: ? Replace carpet with wood, tile, or vinyl flooring. Carpet can trap dander and dust. ? Do not smoke. Do not allow smoking in your home. ? Change your heating and air conditioning filter at least once a month. ? During allergy season:  Keep windows closed as much as possible.  Plan outdoor activities when pollen counts are lowest. This is usually during the evening hours.  When coming indoors, change clothing and shower before sitting on furniture or bedding.  Take over-the-counter and prescription medicines only as told by your health care provider.  Keep all follow-up visits as told by your health care provider. This is important. Contact a health care provider if:  You have a fever.  You develop a persistent cough.  You make whistling sounds when you breathe (you wheeze).  Your symptoms interfere with your normal daily activities. Get help right away if:  You have shortness of breath. Summary  This condition can be managed by taking medicines as directed and avoiding allergens.  Contact your health care provider if you develop a persistent cough or fever.  During allergy season, keep windows closed as much as possible. This information is not intended to replace advice given to you by your health care provider. Make sure you discuss any questions you have with your health care provider. Document Released: 11/23/2000 Document Revised: 04/07/2016  Document Reviewed: 04/07/2016 Elsevier Interactive Patient Education  [employer].  Cervical Radiculopathy Cervical radiculopathy happens when a nerve in the neck (cervical nerve) is pinched or bruised. This condition can develop because of an injury or as part of the  normal aging process. Pressure on the cervical nerves can cause pain or numbness that runs from the neck all the way down into the arm and fingers. Usually, this condition gets better with rest. Treatment may be needed if the condition does not improve. What are the causes? This condition may be caused by:  Injury.  Slipped (herniated) disk.  Muscle tightness in the neck because of overuse.  Arthritis.  Breakdown or degeneration in the bones and joints of the spine (spondylosis) due to aging.  Bone spurs that may develop near the cervical nerves.  What are the signs or symptoms? Symptoms of this condition include:  Pain that runs from the neck to the arm and hand. The pain can be severe or irritating. It may be worse when the neck is moved.  Numbness or weakness in the affected arm and hand.  How is this diagnosed? This condition may be diagnosed based on symptoms, medical history, and a physical exam. You may also have tests, including:  X-rays.  CT scan.  MRI.  Electromyogram (EMG).  Nerve conduction tests.  How is this treated? In many cases, treatment is not needed for this condition. With rest, the condition usually gets better over time. If treatment is needed, options may include:  Wearing a soft neck collar for short periods of time.  Physical therapy to strengthen your neck muscles.  Medicines, such as NSAIDs, oral corticosteroids, or spinal injections.  Surgery. This may be needed if other treatments do not help. Various types of surgery may be done depending on the cause of your problems.  Follow these instructions at home: Managing pain  Take over-the-counter and prescription medicines only as told by your health care provider.  If directed, apply ice to the affected area. ? Put ice in a plastic bag. ? Place a towel between your skin and the bag. ? Leave the ice on for 20 minutes, 2-3 times per day.  If ice does not help, you can try using heat.  Take a warm shower or warm bath, or use a heat pack as told by your health care provider.  Try a gentle neck and shoulder massage to help relieve symptoms. Activity  Rest as needed. Follow instructions from your health care provider about any restrictions on activities.  Do stretching and strengthening exercises as told by your health care provider or physical therapist. General instructions  If you were given a soft collar, wear it as told by your health care provider.  Use a flat pillow when you sleep.  Keep all follow-up visits as told by your health care provider. This is important. Contact a health care provider if:  Your condition does not improve with treatment. Get help right away if:  Your pain gets much worse and cannot be controlled with medicines.  You have weakness or numbness in your hand, arm, face, or leg.  You have a high fever.  You have a stiff, rigid neck.  You lose control of your bowels or your bladder (have incontinence).  You have trouble with walking, balance, or speaking. This information is not intended to replace advice given to you by your health care provider. Make sure you discuss any questions you have with your health care provider.  Document Released: 11/23/2000 Document Revised: 08/06/2015 Document Reviewed: 04/24/2014 Elsevier Interactive Patient Education  [employer].

## 2018-01-05 ENCOUNTER — Other Ambulatory Visit: Payer: Self-pay | Admitting: Internal Medicine

## 2018-01-05 DIAGNOSIS — R110 Nausea: Secondary | ICD-10-CM

## 2018-01-05 DIAGNOSIS — H81399 Other peripheral vertigo, unspecified ear: Secondary | ICD-10-CM

## 2018-01-05 MED ORDER — MECLIZINE HCL 12.5 MG PO TABS: 12.5000 mg | ORAL_TABLET | Freq: Three times a day (TID) | ORAL | 0 refills | Status: DC | PRN

## 2018-01-09 ENCOUNTER — Ambulatory Visit (INDEPENDENT_AMBULATORY_CARE_PROVIDER_SITE_OTHER): Payer: 59 | Admitting: Psychology

## 2018-01-09 DIAGNOSIS — F419 Anxiety disorder, unspecified: Secondary | ICD-10-CM

## 2018-01-09 DIAGNOSIS — F329 Major depressive disorder, single episode, unspecified: Secondary | ICD-10-CM | POA: Diagnosis not present

## 2018-02-23 ENCOUNTER — Other Ambulatory Visit: Payer: Self-pay | Admitting: Internal Medicine

## 2018-02-23 DIAGNOSIS — R110 Nausea: Secondary | ICD-10-CM

## 2018-02-23 DIAGNOSIS — H81399 Other peripheral vertigo, unspecified ear: Secondary | ICD-10-CM

## 2018-02-23 MED ORDER — MECLIZINE HCL 12.5 MG PO TABS: 12.5000 mg | ORAL_TABLET | Freq: Three times a day (TID) | ORAL | 0 refills | Status: DC | PRN

## 2018-03-13 ENCOUNTER — Emergency Department: Payer: 59

## 2018-03-13 ENCOUNTER — Other Ambulatory Visit: Payer: Self-pay

## 2018-03-13 ENCOUNTER — Emergency Department
Admission: EM | Admit: 2018-03-13 | Discharge: 2018-03-13 | Disposition: A | Discharge status: Home / Self Care | Payer: 59 | Attending: Emergency Medicine | Admitting: Emergency Medicine

## 2018-03-13 DIAGNOSIS — Z79899 Other long term (current) drug therapy: Secondary | ICD-10-CM | POA: Insufficient documentation

## 2018-03-13 DIAGNOSIS — J45909 Unspecified asthma, uncomplicated: Secondary | ICD-10-CM | POA: Insufficient documentation

## 2018-03-13 DIAGNOSIS — S0083XA Contusion of other part of head, initial encounter: Secondary | ICD-10-CM | POA: Insufficient documentation

## 2018-03-13 DIAGNOSIS — S0993XA Unspecified injury of face, initial encounter: Secondary | ICD-10-CM | POA: Diagnosis present

## 2018-03-13 DIAGNOSIS — Y929 Unspecified place or not applicable: Secondary | ICD-10-CM | POA: Diagnosis not present

## 2018-03-13 DIAGNOSIS — Y939 Activity, unspecified: Secondary | ICD-10-CM | POA: Insufficient documentation

## 2018-03-13 DIAGNOSIS — S1091XA Abrasion of unspecified part of neck, initial encounter: Secondary | ICD-10-CM | POA: Insufficient documentation

## 2018-03-13 DIAGNOSIS — Z87891 Personal history of nicotine dependence: Secondary | ICD-10-CM | POA: Diagnosis not present

## 2018-03-13 DIAGNOSIS — Y990 Civilian activity done for income or pay: Secondary | ICD-10-CM | POA: Insufficient documentation

## 2018-03-13 DIAGNOSIS — Z23 Encounter for immunization: Secondary | ICD-10-CM | POA: Diagnosis not present

## 2018-03-13 MED ORDER — TRAMADOL HCL 50 MG PO TABS: 50.0000 mg | ORAL_TABLET | Freq: Four times a day (QID) | ORAL | 0 refills | Status: DC | PRN

## 2018-03-13 MED ORDER — HYDROCODONE-ACETAMINOPHEN 5-325 MG PO TABS
1.0000 | ORAL_TABLET | Freq: Once | ORAL | Status: AC
  Administered 2018-03-13: 1 via ORAL
  Filled 2018-03-13: qty 1

## 2018-03-13 MED ORDER — CEPHALEXIN 500 MG PO CAPS: 1000.0000 mg | ORAL_CAPSULE | Freq: Two times a day (BID) | ORAL | 0 refills | Status: DC

## 2018-03-13 MED ORDER — MELOXICAM 15 MG PO TABS: 15.0000 mg | ORAL_TABLET | Freq: Every day | ORAL | 0 refills | Status: DC

## 2018-03-13 MED ORDER — TETANUS-DIPHTH-ACELL PERTUSSIS 5-2.5-18.5 LF-MCG/0.5 IM SUSP
0.5000 mL | Freq: Once | INTRAMUSCULAR | Status: AC
  Administered 2018-03-13: 0.5 mL via INTRAMUSCULAR
  Filled 2018-03-13: qty 0.5
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
## 2018-03-13 NOTE — ED Notes (Signed)
AAOx3.  Skin warm and dry.  Ambulates with easy and steady gait.  Swelling to left face below eye with ecchymosis noted and small amount of nose swelling noted.  Superficial abrasion from a scratch seen to right neck.  Area reddened.

## 2018-03-15 ENCOUNTER — Other Ambulatory Visit: Payer: Self-pay | Admitting: Internal Medicine

## 2018-03-15 DIAGNOSIS — G4700 Insomnia, unspecified: Secondary | ICD-10-CM

## 2018-03-15 MED ORDER — TRAZODONE HCL 50 MG PO TABS: 50.0000 mg | ORAL_TABLET | Freq: Every evening | ORAL | 11 refills | Status: DC | PRN

## 2018-03-20 ENCOUNTER — Ambulatory Visit (INDEPENDENT_AMBULATORY_CARE_PROVIDER_SITE_OTHER): Payer: 59 | Admitting: Psychology

## 2018-03-20 DIAGNOSIS — F329 Major depressive disorder, single episode, unspecified: Secondary | ICD-10-CM

## 2018-03-20 DIAGNOSIS — F419 Anxiety disorder, unspecified: Secondary | ICD-10-CM | POA: Diagnosis not present

## 2018-03-23 ENCOUNTER — Encounter: Payer: Self-pay | Admitting: Internal Medicine

## 2018-03-23 ENCOUNTER — Ambulatory Visit (INDEPENDENT_AMBULATORY_CARE_PROVIDER_SITE_OTHER): Payer: 59 | Admitting: Internal Medicine

## 2018-03-23 DIAGNOSIS — J309 Allergic rhinitis, unspecified: Secondary | ICD-10-CM

## 2018-03-23 DIAGNOSIS — S0012XD Contusion of left eyelid and periocular area, subsequent encounter: Secondary | ICD-10-CM

## 2018-03-23 DIAGNOSIS — S1081XD Abrasion of other specified part of neck, subsequent encounter: Secondary | ICD-10-CM

## 2018-03-23 DIAGNOSIS — G4700 Insomnia, unspecified: Secondary | ICD-10-CM

## 2018-03-23 DIAGNOSIS — M5412 Radiculopathy, cervical region: Secondary | ICD-10-CM

## 2018-03-23 DIAGNOSIS — M545 Low back pain: Secondary | ICD-10-CM

## 2018-03-23 DIAGNOSIS — G8929 Other chronic pain: Secondary | ICD-10-CM

## 2018-03-23 MED ORDER — MONTELUKAST SODIUM 10 MG PO TABS: 10.0000 mg | ORAL_TABLET | Freq: Every day | ORAL | 3 refills | Status: DC

## 2018-03-23 MED ORDER — IBUPROFEN 800 MG PO TABS: 800.0000 mg | ORAL_TABLET | Freq: Three times a day (TID) | ORAL | 2 refills | Status: DC | PRN

## 2018-03-23 MED ORDER — TRAZODONE HCL 50 MG PO TABS: 50.0000 mg | ORAL_TABLET | Freq: Every evening | ORAL | 11 refills | Status: DC | PRN

## 2018-03-23 NOTE — Progress Notes (Signed)
Pre visit review using our clinic review tool, if applicable. No additional management support is needed unless otherwise documented below in the visit note. 

## 2018-03-23 NOTE — Patient Instructions (Signed)
Eye Contusion An eye contusion is a deep bruise of the eye or the area around the eye. This injury is often called a "black eye." Contusions are the result of a blunt injury to tissues and muscle fibers under the skin. This causes bleeding under the skin. The skin over the contusion may turn blue, purple, green, or yellow. Minor injuries may be painless. More severe contusions may stay painful and swollen for a few weeks. Eye contusions can affect your eyeball and your eyesight. What are the causes? This condition may be caused by:  A hard hit or direct force to your face, nose, or eye.  A head injury that causes the blood under your skin to flow toward your eyelids.  Facial surgery, such as a face-lift or nose surgery.  Dental work, including wisdom tooth extraction or dental implant surgery. What are the signs or symptoms? Symptoms of this condition include:  Pain and swelling around your eye.  Discoloration around your eye. ? The area may start out red and then turn blue, purple, green, or yellow.  Blurry vision.  Tearing.  Eyeball redness. How is this diagnosed? This condition is diagnosed based on a physical exam and your medical history. During the exam, your health care provider may:  Test your eye motion and make sure that your eye is not injured.  [name] a light into your eye to make sure that your pupil widens (dilates) normally.  Check the bones in your face and around your eye for injuries.  You may also have: ? A vision test. ? An X-ray or a CT scan to check for other injuries, such as broken bones. How is this treated? An eye contusion usually heals on its own in a few days or weeks. If needed, this condition may be treated by:  Icing your eye and taking pain medicine.  Surgery. This may be needed if you have broken bones or an injury to the eyeball. Follow these instructions at home: Managing pain and swelling   If directed, put ice on the injured  area: ? Put ice in a plastic bag. ? Place a towel between your skin and the bag. ? Leave the ice on for 20 minutes, 2-3 times a day. General instructions  Sleep with your head raised (elevated). You may do this by putting an extra pillow under your head.  Return to your normal activities as told by your health care provider. Ask your health care provider what activities are safe for you.  Take over-the-counter and prescription medicines only as told by your health care provider.  Keep all follow-up visits as told by your health care provider. This is important. Contact a health care provider if:  Your symptoms do not improve after several days.  Your swelling or pain is not relieved with medicines. Get help right away if:  You have vision loss.  You have double vision.  Your eye suddenly becomes red.  Your pupil is an odd shape or size.  You have severe pain.  You have a severe headache.  You feel nauseous or you vomit.  You feel dizzy or sleepy, or you feel like you will faint.  You faint.  You have a lot of clear fluid or blood coming from your eye or nose. Summary  An eye contusion is a deep bruise of the eye or the area around the eye.  Contusions are the result of a blunt injury to tissues and muscle fibers under the skin.  An eye contusion usually heals on its own in a few days or weeks. If needed, it may be treated with ice and pain medicines.  Surgery may be needed if you have broken bones or an injury to the eyeball. This information is not intended to replace advice given to you by your health care provider. Make sure you discuss any questions you have with your health care provider. Document Released: 02/26/2000 Document Revised: 09/26/2017 Document Reviewed: 09/26/2017 Elsevier Interactive Patient Education  2019 [employer].
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
## 2018-04-10 ENCOUNTER — Other Ambulatory Visit: Payer: Self-pay | Admitting: Family Medicine
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
## 2018-04-17 ENCOUNTER — Ambulatory Visit (INDEPENDENT_AMBULATORY_CARE_PROVIDER_SITE_OTHER): Payer: 59 | Admitting: Psychology

## 2018-04-17 DIAGNOSIS — F419 Anxiety disorder, unspecified: Secondary | ICD-10-CM | POA: Diagnosis not present

## 2018-04-17 DIAGNOSIS — F329 Major depressive disorder, single episode, unspecified: Secondary | ICD-10-CM

## 2018-05-02 ENCOUNTER — Encounter: Payer: Self-pay | Admitting: Internal Medicine

## 2018-05-17 ENCOUNTER — Ambulatory Visit (INDEPENDENT_AMBULATORY_CARE_PROVIDER_SITE_OTHER): Payer: 59 | Admitting: Internal Medicine

## 2018-05-17 ENCOUNTER — Encounter: Payer: Self-pay | Admitting: Internal Medicine

## 2018-05-17 VITALS — BP 116/62 | HR 64 | Temp 98.6°F | Ht 63.0 in | Wt 169.0 lb

## 2018-05-17 DIAGNOSIS — M5137 Other intervertebral disc degeneration, lumbosacral region: Secondary | ICD-10-CM

## 2018-05-17 DIAGNOSIS — M545 Low back pain, unspecified: Secondary | ICD-10-CM

## 2018-05-17 DIAGNOSIS — R937 Abnormal findings on diagnostic imaging of other parts of musculoskeletal system: Secondary | ICD-10-CM

## 2018-05-17 DIAGNOSIS — M5136 Other intervertebral disc degeneration, lumbar region: Secondary | ICD-10-CM | POA: Diagnosis not present

## 2018-05-17 MED ORDER — PREDNISONE 20 MG PO TABS: 40.0000 mg | ORAL_TABLET | Freq: Every day | ORAL | 0 refills | Status: DC

## 2018-05-17 MED ORDER — TRAMADOL HCL 50 MG PO TABS: 50.0000 mg | ORAL_TABLET | Freq: Two times a day (BID) | ORAL | 0 refills | Status: DC | PRN

## 2018-05-17 NOTE — Progress Notes (Signed)
Chief Complaint  Patient presents with  . Back Pain   Left low back pain  Monday bending down to put sock on and c/o left low back pain 7-8/10 w/o radiation. Tried Tramadol and Mobic with some relief. She also takes gabapentin 300 mg at night. Back pain Is also worse with getting out of the care.  She has also tried heat reviewed old CT ab/pelvis with DDD changes and Xray with DDD changes in low back from 2018    Review of Systems  Constitutional: Negative for weight loss.  HENT: Negative for hearing loss.   Eyes: Negative for blurred vision.  Respiratory: Negative for shortness of breath.   Cardiovascular: Negative for chest pain.  Musculoskeletal: Positive for back pain.  Skin: Negative for rash.  Neurological: Negative for sensory change.   Past Medical History:  Diagnosis Date  . Anxiety   . Asthma   . Carpal tunnel syndrome    R hand   . Colon polyps   . Depression   . Eosinophilic esophagitis    noted in [place] with GI path report 09/29/14   . Esophageal stricture    s/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 Edwin Cap   . Herniated disc, cervical    s/p epidural injection  . Hyperlipidemia   . Low back pain   . Migraines    Past Surgical History:  Procedure Laterality Date  . CESAREAN SECTION    . COLONOSCOPY WITH PROPOFOL N/A 03/27/2017   Procedure: COLONOSCOPY WITH PROPOFOL;  Surgeon: Jonathon Bellows, MD;  Location: [facility] ENDOSCOPY;  Service: Gastroenterology;  Laterality: N/A;  . ESOPHAGOGASTRODUODENOSCOPY    . ESOPHAGOGASTRODUODENOSCOPY (EGD) WITH PROPOFOL N/A 03/27/2017   Procedure: ESOPHAGOGASTRODUODENOSCOPY (EGD) WITH PROPOFOL WITH DILATION;  Surgeon: Jonathon Bellows, MD;  Location: [facility] ENDOSCOPY;  Service: Gastroenterology;  Laterality: N/A;  . THROAT SURGERY  2015  . UMBILICAL HERNIA REPAIR  2006    x 2   w mesh per pt   Family History  Problem Relation Age of Onset  . Breast cancer Mother   . Thyroid disease Mother   . Colon polyps Mother    . Prostate cancer Father   . Cancer - Prostate Father   . Breast cancer Maternal Grandmother    Social History   Socioeconomic History  . Marital status: Single    Spouse name: Not on file  . Number of children: Not on file  . Years of education: Not on file  . Highest education level: Not on file  Occupational History  . Not on file  Social Needs  . Financial resource strain: Not on file  . Food insecurity:    Worry: Not on file    Inability: Not on file  . Transportation needs:    Medical: Not on file    Non-medical: Not on file  Tobacco Use  . Smoking status: Former Smoker    Packs/day: 1.00    Years: 20.00    Pack years: 20.00    Last attempt to quit: 06/02/2006    Years since quitting: 11.9  . Smokeless tobacco: Never Used  Substance and Sexual Activity  . Alcohol use: Yes    Alcohol/week: 2.0 - 3.0 standard drinks    Types: 2 - 3 Standard drinks or equivalent per week    Comment: socially  . Drug use: No  . Sexual activity: Yes    Partners: Male  Lifestyle  . Physical activity:    Days per week: Not on file  Minutes per session: Not on file  . Stress: Not on file  Relationships  . Social connections:    Talks on phone: Not on file    Gets together: Not on file    Attends religious service: Not on file    Active member of club or organization: Not on file    Attends meetings of clubs or organizations: Not on file    Relationship status: Not on file  . Intimate partner violence:    Fear of current or ex partner: Not on file    Emotionally abused: Not on file    Physically abused: Not on file    Forced sexual activity: Not on file  Other Topics Concern  . Not on file  Social History Narrative   Works in retail    2 kids 51 and 12 son and daughter live in [place]. As of 02/26/17    Divorced.    Former smoker 20+ years quit in 1997 then started again for 5 years and quit 10-12 years ago as of 03/08/17    Current Meds  Medication Sig  . Acetaminophen  (TYLENOL GO TABS EXTRA STRENGTH PO) Take by mouth every 8 (eight) hours.  . fluticasone (FLONASE) 50 MCG/ACT nasal spray Place 2 sprays into both nostrils daily. Max 2 sprays  . gabapentin (NEURONTIN) 300 MG capsule Take 300 mg by mouth at bedtime.  Marland Kitchen ibuprofen (ADVIL,MOTRIN) 800 MG tablet Take 1 tablet (800 mg total) by mouth every 8 (eight) hours as needed (use sparingly).  . meclizine (ANTIVERT) 12.5 MG tablet Take 1 tablet (12.5 mg total) by mouth 3 (three) times daily as needed for dizziness.  . meloxicam (MOBIC) 15 MG tablet Take 1 tablet (15 mg total) by mouth daily.  . methocarbamol (ROBAXIN) 750 MG tablet Take 1 tablet (750 mg total) by mouth every 6 (six) hours as needed for muscle spasms.  . montelukast (SINGULAIR) 10 MG tablet Take 1 tablet (10 mg total) by mouth at bedtime.  Marland Kitchen omega-3 acid ethyl esters (LOVAZA) 1 g capsule Take 2 capsules (2 g total) by mouth 2 (two) times daily.  Marland Kitchen omeprazole (PRILOSEC) 40 MG capsule Take 1 capsule (40 mg total) by mouth daily.  . traMADol (ULTRAM) 50 MG tablet Take 1 tablet (50 mg total) by mouth every 12 (twelve) hours as needed.  . traZODone (DESYREL) 50 MG tablet Take 1-1.5 tablets (50-75 mg total) by mouth at bedtime as needed for sleep.  . valACYclovir (VALTREX) 1000 MG tablet Take 1 tablet (1,000 mg total) by mouth daily. X 5 days with outbreak as needed  . [DISCONTINUED] traMADol (ULTRAM) 50 MG tablet Take 1 tablet (50 mg total) by mouth every 6 (six) hours as needed.   Allergies  Allergen Reactions  . Penicillins   . Sulfa Antibiotics    No results found for this or any previous visit (from the past 2160 hour(s)). Objective  Body mass index is 29.94 kg/m. Wt Readings from Last 3 Encounters:  05/17/18 169 lb (76.7 kg)  03/23/18 167 lb 12.8 oz (76.1 kg)  03/13/18 160 lb (72.6 kg)   Temp Readings from Last 3 Encounters:  05/17/18 98.6 F (37 C) (Oral)  03/23/18 98.6 F (37 C) (Oral)  03/13/18 97.9 F (36.6 C) (Oral)   BP  Readings from Last 3 Encounters:  05/17/18 116/62  03/23/18 128/78  03/13/18 128/85   Pulse Readings from Last 3 Encounters:  05/17/18 64  03/23/18 67  03/13/18 68    Physical Exam  Vitals signs and nursing note reviewed.  Constitutional:      Appearance: Normal appearance. She is well-developed and well-groomed.  HENT:     Head: Normocephalic and atraumatic.     Nose: Nose normal.     Mouth/Throat:     Mouth: Mucous membranes are moist.     Pharynx: Oropharynx is clear.  Eyes:     Conjunctiva/sclera: Conjunctivae normal.     Pupils: Pupils are equal, round, and reactive to light.  Cardiovascular:     Rate and Rhythm: Normal rate and regular rhythm.     Heart sounds: Normal heart sounds. No murmur.  Pulmonary:     Effort: Pulmonary effort is normal.     Breath sounds: Normal breath sounds.  Musculoskeletal:     Lumbar back: She exhibits tenderness.     Comments: +str8 leg test right >left   Skin:    General: Skin is warm and dry.  Neurological:     General: No focal deficit present.     Mental Status: She is alert and oriented to person, place, and time. Mental status is at baseline.     Gait: Gait normal.  Psychiatric:        Attention and Perception: Attention and perception normal.        Mood and Affect: Mood and affect normal.        Speech: Speech normal.        Behavior: Behavior normal. Behavior is cooperative.        Thought Content: Thought content normal.        Cognition and Memory: Cognition and memory normal.        Judgment: Judgment normal.     Assessment   1. Left low back pain with + str8 leg test on left w/o radiation with h/o DDD in low back on CT and Xray Plan  1. Prednisone 40 mg qam x 1 week  Tramadol bid prn 50 mg  Prn Robaxin  Prn Mobic  F/u with Dr. Sharlet Salina If pain meds will be needed long term will need pain clinic  Provider: Dr. Olivia Mackie McLean-Scocuzza-Internal Medicine

## 2018-05-17 NOTE — Patient Instructions (Addendum)
Web MD or [facility] back stretches  Leah [name] about MRI low back     Acute Back Pain, Adult Acute back pain is sudden and usually short-lived. It is often caused by an injury to the muscles and tissues in the back. The injury may result from:  A muscle or ligament getting overstretched or torn (strained). Ligaments are tissues that connect bones to each other. Lifting something improperly can cause a back strain.  Wear and tear (degeneration) of the spinal disks. Spinal disks are circular tissue that provides cushioning between the bones of the spine (vertebrae).  Twisting motions, such as while playing sports or doing yard work.  A hit to the back.  Arthritis. You may have a physical exam, lab tests, and imaging tests to find the cause of your pain. Acute back pain usually goes away with rest and home care. Follow these instructions at home: Managing pain, stiffness, and swelling  Take over-the-counter and prescription medicines only as told by your health care provider.  Your health care provider may recommend applying ice during the first 24-48 hours after your pain starts. To do this: ? Put ice in a plastic bag. ? Place a towel between your skin and the bag. ? Leave the ice on for 20 minutes, 2-3 times a day.  If directed, apply heat to the affected area as often as told by your health care provider. Use the heat source that your health care provider recommends, such as a moist heat pack or a heating pad. ? Place a towel between your skin and the heat source. ? Leave the heat on for 20-30 minutes. ? Remove the heat if your skin turns bright red. This is especially important if you are unable to feel pain, heat, or cold. You have a greater risk of getting burned. Activity   Do not stay in bed. Staying in bed for more than 1-2 days can delay your recovery.  Sit up and stand up straight. Avoid leaning forward when you sit, or hunching over when you stand. ? If you work at a  desk, sit close to it so you do not need to lean over. Keep your chin tucked in. Keep your neck drawn back, and keep your elbows bent at a right angle. Your arms should look like the letter "L." ? Sit high and close to the steering wheel when you drive. Add lower back (lumbar) support to your car seat, if needed.  Take short walks on even surfaces as soon as you are able. Try to increase the length of time you walk each day.  Do not sit, drive, or stand in one place for more than 30 minutes at a time. Sitting or standing for long periods of time can put stress on your back.  Do not drive or use heavy machinery while taking prescription pain medicine.  Use proper lifting techniques. When you bend and lift, use positions that put less stress on your back: ? Lovilia your knees. ? Keep the load close to your body. ? Avoid twisting.  Exercise regularly as told by your health care provider. Exercising helps your back heal faster and helps prevent back injuries by keeping muscles strong and flexible.  Work with a physical therapist to make a safe exercise program, as recommended by your health care provider. Do any exercises as told by your physical therapist. Lifestyle  Maintain a healthy weight. Extra weight puts stress on your back and makes it difficult to have good posture.  Avoid activities or situations that make you feel anxious or stressed. Stress and anxiety increase muscle tension and can make back pain worse. Learn ways to manage anxiety and stress, such as through exercise. General instructions  Sleep on a firm mattress in a comfortable position. Try lying on your side with your knees slightly bent. If you lie on your back, put a pillow under your knees.  Follow your treatment plan as told by your health care provider. This may include: ? Cognitive or behavioral therapy. ? Acupuncture or massage therapy. ? Meditation or yoga. Contact a health care provider if:  You have pain that  is not relieved with rest or medicine.  You have increasing pain going down into your legs or buttocks.  Your pain does not improve after 2 weeks.  You have pain at night.  You lose weight without trying.  You have a fever or chills. Get help right away if:  You develop new bowel or bladder control problems.  You have unusual weakness or numbness in your arms or legs.  You develop nausea or vomiting.  You develop abdominal pain.  You feel faint. Summary  Acute back pain is sudden and usually short-lived.  Use proper lifting techniques. When you bend and lift, use positions that put less stress on your back.  Take over-the-counter and prescription medicines and apply heat or ice as directed by your health care provider. This information is not intended to replace advice given to you by your health care provider. Make sure you discuss any questions you have with your health care provider. Document Released: 02/28/2005 Document Revised: 10/05/2017 Document Reviewed: 10/12/2016 Elsevier Interactive Patient Education  2019 [employer].

## 2018-05-17 NOTE — Progress Notes (Signed)
Pre visit review using our clinic review tool, if applicable. No additional management support is needed unless otherwise documented below in the visit note. 

## 2018-05-21 ENCOUNTER — Other Ambulatory Visit: Payer: Self-pay | Admitting: Gastroenterology

## 2018-05-21 DIAGNOSIS — R1319 Other dysphagia: Secondary | ICD-10-CM

## 2018-05-21 DIAGNOSIS — R1310 Dysphagia, unspecified: Secondary | ICD-10-CM

## 2018-05-28 ENCOUNTER — Encounter: Payer: Self-pay | Admitting: Internal Medicine

## 2018-06-05 ENCOUNTER — Emergency Department
Admission: EM | Admit: 2018-06-05 | Discharge: 2018-06-05 | Disposition: A | Discharge status: Home / Self Care | Payer: 59 | Attending: Emergency Medicine | Admitting: Emergency Medicine

## 2018-06-05 ENCOUNTER — Ambulatory Visit: Payer: 59 | Admitting: Psychology

## 2018-06-05 ENCOUNTER — Other Ambulatory Visit: Payer: Self-pay

## 2018-06-05 ENCOUNTER — Ambulatory Visit
Admission: RE | Admit: 2018-06-05 | Discharge: 2018-06-05 | Disposition: A | Discharge status: Home / Self Care | Payer: 59 | Source: Ambulatory Visit | Attending: Internal Medicine | Admitting: Internal Medicine

## 2018-06-05 DIAGNOSIS — J45909 Unspecified asthma, uncomplicated: Secondary | ICD-10-CM | POA: Diagnosis not present

## 2018-06-05 DIAGNOSIS — G8929 Other chronic pain: Secondary | ICD-10-CM | POA: Diagnosis not present

## 2018-06-05 DIAGNOSIS — Z79899 Other long term (current) drug therapy: Secondary | ICD-10-CM | POA: Diagnosis not present

## 2018-06-05 DIAGNOSIS — M5137 Other intervertebral disc degeneration, lumbosacral region: Secondary | ICD-10-CM

## 2018-06-05 DIAGNOSIS — R937 Abnormal findings on diagnostic imaging of other parts of musculoskeletal system: Secondary | ICD-10-CM

## 2018-06-05 DIAGNOSIS — M25551 Pain in right hip: Secondary | ICD-10-CM

## 2018-06-05 DIAGNOSIS — M545 Low back pain, unspecified: Secondary | ICD-10-CM

## 2018-06-05 DIAGNOSIS — Z87891 Personal history of nicotine dependence: Secondary | ICD-10-CM | POA: Insufficient documentation

## 2018-06-05 DIAGNOSIS — M5136 Other intervertebral disc degeneration, lumbar region: Secondary | ICD-10-CM

## 2018-06-05 MED ORDER — PREDNISONE 10 MG PO TABS: ORAL_TABLET | ORAL | 0 refills | Status: DC

## 2018-06-05 MED ORDER — DEXAMETHASONE SODIUM PHOSPHATE 10 MG/ML IJ SOLN
10.0000 mg | Freq: Once | INTRAMUSCULAR | Status: AC
  Administered 2018-06-05: 10 mg via INTRAMUSCULAR
  Filled 2018-06-05: qty 1

## 2018-06-05 NOTE — ED Provider Notes (Signed)
[facility] Emergency Department Provider Note  ____________________________________________   First MD Initiated Contact with Patient 06/05/18 1756     (approximate)  I have reviewed the triage vital signs and the nursing notes.   HISTORY  Chief Complaint Hip Pain   HPI Leah [name] is a 53 y.o. female presents to the ED with complaint of right hip pain radiating into the right groin since waking up from a nap.  Patient states that she went for an MRI earlier this morning for evaluation of her chronic low back pain.  Patient denies any fall or injury.  Patient denies any urinary or bowel incontinence, saddle anesthesias.  Patient has continued to be ambulatory today without assistance.  Patient currently takes methocarbamol and tramadol for her back.  Currently she rates her pain as a 10/10.     Past Medical History:  Diagnosis Date  . Anxiety   . Asthma   . Carpal tunnel syndrome    R hand   . Colon polyps   . Depression   . Eosinophilic esophagitis    noted in [place] with GI path report 09/29/14   . Esophageal stricture    s/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 Edwin Cap   . Herniated disc, cervical    s/p epidural injection  . Hyperlipidemia   . Low back pain   . Migraines     Patient Active Problem List   Diagnosis Date Noted  . Insomnia 03/23/2018  . Allergic rhinitis 01/03/2018  . Anxiety 01/03/2018  . Cervical radiculopathy 10/03/2017  . Tinea corporis 10/03/2017  . HLD (hyperlipidemia) 04/04/2017  . Breast cyst, left 04/04/2017  . Aortic atherosclerosis ([place]) 04/04/2017  . Anxiety and depression 03/08/2017  . Dysphagia 03/08/2017  . Hip pain-right 03/08/2017  . Low back pain 03/08/2017  . Flank pain 10/18/2016  . Asthma 06/01/2016    Past Surgical History:  Procedure Laterality Date  . CESAREAN SECTION    . COLONOSCOPY WITH PROPOFOL N/A 03/27/2017   Procedure: COLONOSCOPY WITH PROPOFOL;   Surgeon: Jonathon Bellows, MD;  Location: [facility] ENDOSCOPY;  Service: Gastroenterology;  Laterality: N/A;  . ESOPHAGOGASTRODUODENOSCOPY    . ESOPHAGOGASTRODUODENOSCOPY (EGD) WITH PROPOFOL N/A 03/27/2017   Procedure: ESOPHAGOGASTRODUODENOSCOPY (EGD) WITH PROPOFOL WITH DILATION;  Surgeon: Jonathon Bellows, MD;  Location: [facility] ENDOSCOPY;  Service: Gastroenterology;  Laterality: N/A;  . THROAT SURGERY  2015  . UMBILICAL HERNIA REPAIR  2006    x 2   w mesh per pt    Prior to Admission medications   Medication Sig Start Date End Date Taking? Authorizing Provider  Acetaminophen (TYLENOL GO TABS EXTRA STRENGTH PO) Take by mouth every 8 (eight) hours.    [provider]  fluticasone (FLONASE) 50 MCG/ACT nasal spray Place 2 sprays into both nostrils daily. Max 2 sprays 01/03/18   McLean-Scocuzza, Nino Glow, MD  gabapentin (NEURONTIN) 300 MG capsule Take 300 mg by mouth at bedtime.    [provider]  ibuprofen (ADVIL,MOTRIN) 800 MG tablet Take 1 tablet (800 mg total) by mouth every 8 (eight) hours as needed (use sparingly). 03/23/18   McLean-Scocuzza, Nino Glow, MD  meclizine (ANTIVERT) 12.5 MG tablet Take 1 tablet (12.5 mg total) by mouth 3 (three) times daily as needed for dizziness. 02/23/18   McLean-Scocuzza, Nino Glow, MD  methocarbamol (ROBAXIN) 750 MG tablet Take 1 tablet (750 mg total) by mouth every 6 (six) hours as needed for muscle spasms. 12/12/17   McLean-Scocuzza, Nino Glow, MD  montelukast (SINGULAIR) 10 MG tablet Take 1 tablet (10 mg total) by mouth at bedtime. 03/23/18   McLean-Scocuzza, Nino Glow, MD  omega-3 acid ethyl esters (LOVAZA) 1 g capsule Take 2 capsules (2 g total) by mouth 2 (two) times daily. 04/04/17   McLean-Scocuzza, Nino Glow, MD  omeprazole (PRILOSEC) 40 MG capsule Take 1 capsule (40 mg total) by mouth daily. 03/09/17   Jonathon Bellows, MD  predniSONE (DELTASONE) 10 MG tablet Take 6 tablets  today, on day 2 take 5 tablets, day 3 take 4 tablets, day 4 take 3 tablets, day 5 take  2  tablets and 1 tablet the last day 06/05/18   Johnn Hai, PA-C  traMADol (ULTRAM) 50 MG tablet Take 1 tablet (50 mg total) by mouth every 12 (twelve) hours as needed. 05/17/18   McLean-Scocuzza, Nino Glow, MD  traZODone (DESYREL) 50 MG tablet Take 1-1.5 tablets (50-75 mg total) by mouth at bedtime as needed for sleep. 03/23/18   McLean-Scocuzza, Nino Glow, MD  valACYclovir (VALTREX) 1000 MG tablet Take 1 tablet (1,000 mg total) by mouth daily. X 5 days with outbreak as needed 07/03/17   McLean-Scocuzza, Nino Glow, MD    Allergies Penicillins and Sulfa antibiotics  Family History  Problem Relation Age of Onset  . Breast cancer Mother   . Thyroid disease Mother   . Colon polyps Mother   . Prostate cancer Father   . Cancer - Prostate Father   . Breast cancer Maternal Grandmother     Social History Social History   Tobacco Use  . Smoking status: Former Smoker    Packs/day: 1.00    Years: 20.00    Pack years: 20.00    Last attempt to quit: 06/02/2006    Years since quitting: 12.0  . Smokeless tobacco: Never Used  Substance Use Topics  . Alcohol use: Yes    Alcohol/week: 2.0 - 3.0 standard drinks    Types: 2 - 3 Standard drinks or equivalent per week    Comment: socially  . Drug use: No    Review of Systems Constitutional: No fever/chills Eyes: No visual changes. ENT: No sore throat. Cardiovascular: Denies chest pain. Respiratory: Denies shortness of breath. Gastrointestinal: No abdominal pain.  No nausea, no vomiting.  Genitourinary: Negative for dysuria. Musculoskeletal: Positive for chronic back pain.  Positive for right hip pain with radiculopathy. Skin: Negative for rash. Neurological: Negative for headaches, focal weakness or numbness. ____________________________________________   PHYSICAL EXAM:  VITAL SIGNS: ED Triage Vitals  Enc Vitals Group     BP 06/05/18 1742 140/87     Pulse Rate 06/05/18 1742 63     Resp 06/05/18 1742 18     Temp 06/05/18 1742 98.6 F (37  C)     Temp Source 06/05/18 1742 Oral     SpO2 06/05/18 1742 96 %     Weight 06/05/18 1743 167 lb 8.8 oz (76 kg)     Height 06/05/18 1743 5\' 3"  (1.6 m)     Head Circumference --      Peak Flow --      Pain Score 06/05/18 1743 10     Pain Loc --      Pain Edu? --      Excl. in [place]? --    Constitutional: Alert and oriented. Well appearing and in no acute distress. Eyes: Conjunctivae are normal.  Head: Atraumatic. Neck: No stridor.   Cardiovascular: Normal rate, regular rhythm. Grossly normal heart sounds.  Good peripheral circulation. Respiratory:  Normal respiratory effort.  No retractions. Lungs CTAB. Gastrointestinal: Soft and nontender. No distention.  Musculoskeletal: On examination of the lumbar spine there is no gross deformity.  There is some tenderness on palpation of the posterior right hip and SI joint area.  No evidence of injury noted.  Patient is able to move lower extremities without any assistance and stand.  Good muscle strength bilaterally. Neurologic:  Normal speech and language. No gross focal neurologic deficits are appreciated.  Reflexes are 2+ bilaterally.  No gait instability. Skin:  Skin is warm, dry and intact.  No discoloration or edema of the right hip noted. Psychiatric: Mood and affect are normal. Speech and behavior are normal.  ____________________________________________   LABS (all labs ordered are listed, but only abnormal results are displayed)  Labs Reviewed - No data to display RADIOLOGY MRI performed earlier today was reviewed by report from the radiologist.  There is degenerative disc changes and some disc bulging but no evidence of cauda equina.  ____________________________________________   PROCEDURES  Procedure(s) performed (including Critical Care):  Procedures   ____________________________________________   INITIAL IMPRESSION / ASSESSMENT AND PLAN / ED COURSE  As part of my medical decision making, I reviewed the following  data within the electronic MEDICAL RECORD NUMBER Notes from prior ED visits and [place] Controlled Substance Database  She presents to the ED with complaint of acute exacerbation of her chronic low back pain with radiation into her right hip.  She states that she woke from a nap this afternoon with this pain and denies any recent injury.  Patient was in the hospital earlier this morning for an MRI of her back.  Report was reviewed and patient was made aware that she does not have any emergent changes in her MRI.  Patient was given Decadron 10 mg IM along with a prescription for a tapering dose of prednisone.  Patient currently has tramadol at home that she takes as needed for pain.  She also has prescription for methocarbamol as needed for muscle spasms.  She is to follow-up with her PCP and specialist concerning her back pain.  She was instructed to return to the emergency department if any severe worsening of her symptoms or incontinence of both bowel and bladder. ____________________________________________   FINAL CLINICAL IMPRESSION(S) / ED DIAGNOSES  Final diagnoses:  Acute exacerbation of chronic low back pain  Right hip pain     ED Discharge Orders         Ordered    predniSONE (DELTASONE) 10 MG tablet     06/05/18 1818           Note:  This document was prepared using Dragon voice recognition software and may include unintentional dictation errors.    Johnn Hai, PA-C 06/05/18 2030    Schuyler Amor, MD 06/22/18 [phone]

## 2018-06-05 NOTE — ED Notes (Signed)
Pt is being discharged to home. Aox4, VSS, pt does not c/o any pain at this time. AVS & RX was given and explained to the pt and she verbalized understanding of all information.

## 2018-06-05 NOTE — Discharge Instructions (Addendum)
Follow-up with your doctor about the results of your MRI.  MRI showed bulging disc but no herniation.  Continue taking your methocarbamol and tramadol.  Begin taking steroid taper as directed.  You may also use ice or heat to your back.  Also elevating your legs may alleviate some of the pain and stress from your back causing your problems.  Return to the emergency department if any severe worsening of your symptoms and if there is any loss of bladder or bowel control.

## 2018-06-05 NOTE — ED Triage Notes (Addendum)
Right hip pain that radiates to right groin that began today upon awakening after a nap. Pt had MRI today for lower back pain. No loss of bowels. Pt alert and oriented X4, active, cooperative, pt in NAD. RR even and unlabored, color WNL.    Pt took a tramadol PTA. Ambulates appropriately.

## 2018-06-11 NOTE — Telephone Encounter (Signed)
Pt is calling to get refill on rx Omeprazole Dr 40 mg  To [facility] in [place] refill 90 days  She is out and needs this filled

## 2018-06-26 ENCOUNTER — Other Ambulatory Visit: Payer: Self-pay | Admitting: Internal Medicine

## 2018-06-26 DIAGNOSIS — M62838 Other muscle spasm: Secondary | ICD-10-CM

## 2018-06-26 DIAGNOSIS — H81399 Other peripheral vertigo, unspecified ear: Secondary | ICD-10-CM

## 2018-06-26 DIAGNOSIS — R110 Nausea: Secondary | ICD-10-CM

## 2018-06-26 MED ORDER — MECLIZINE HCL 12.5 MG PO TABS: 12.5000 mg | ORAL_TABLET | Freq: Three times a day (TID) | ORAL | 0 refills | Status: DC | PRN

## 2018-06-26 MED ORDER — METHOCARBAMOL 750 MG PO TABS: 750.0000 mg | ORAL_TABLET | Freq: Four times a day (QID) | ORAL | 5 refills | Status: DC | PRN

## 2018-06-29 ENCOUNTER — Encounter: Payer: Self-pay | Admitting: Internal Medicine

## 2018-06-29 ENCOUNTER — Other Ambulatory Visit: Payer: Self-pay | Admitting: Internal Medicine

## 2018-06-29 DIAGNOSIS — J309 Allergic rhinitis, unspecified: Secondary | ICD-10-CM

## 2018-06-29 MED ORDER — CETIRIZINE HCL 10 MG PO TABS: 10.0000 mg | ORAL_TABLET | Freq: Every day | ORAL | 3 refills | Status: DC | PRN

## 2018-08-07 ENCOUNTER — Other Ambulatory Visit: Payer: Self-pay | Admitting: Internal Medicine

## 2018-08-07 ENCOUNTER — Other Ambulatory Visit: Payer: Self-pay | Admitting: Neurosurgery

## 2018-08-07 DIAGNOSIS — R200 Anesthesia of skin: Secondary | ICD-10-CM

## 2018-08-07 DIAGNOSIS — G4700 Insomnia, unspecified: Secondary | ICD-10-CM

## 2018-08-07 MED ORDER — TRAZODONE HCL 50 MG PO TABS: 50.0000 mg | ORAL_TABLET | Freq: Every evening | ORAL | 4 refills | Status: DC | PRN

## 2018-08-16 ENCOUNTER — Other Ambulatory Visit: Payer: Self-pay

## 2018-08-16 ENCOUNTER — Ambulatory Visit
Admission: RE | Admit: 2018-08-16 | Discharge: 2018-08-16 | Disposition: A | Discharge status: Home / Self Care | Payer: 59 | Source: Ambulatory Visit | Attending: Neurosurgery | Admitting: Neurosurgery

## 2018-08-16 DIAGNOSIS — R200 Anesthesia of skin: Secondary | ICD-10-CM | POA: Insufficient documentation

## 2018-08-16 LAB — PROTIME-INR
INR: 0.9 (ref 0.8–1.2)
Prothrombin Time: 12.3 seconds (ref 11.4–15.2)

## 2018-08-16 LAB — CSF CELL COUNT WITH DIFFERENTIAL
Eosinophils, CSF: 0 %
Lymphs, CSF: 45 %
Monocyte-Macrophage-Spinal Fluid: 33 %
Other Cells, CSF: 0
RBC Count, CSF: 179 /mm3 — ABNORMAL HIGH (ref 0–3)
RBC Count, CSF: 201 /mm3 — ABNORMAL HIGH (ref 0–3)
Segmented Neutrophils-CSF: 22 %
Tube #: 1
Tube #: 3
WBC, CSF: 0 /mm3 (ref 0–5)
WBC, CSF: 4 /mm3 (ref 0–5)

## 2018-08-16 LAB — PROTEIN, CSF: Total  Protein, CSF: 37 mg/dL (ref 15–45)

## 2018-08-16 LAB — CBC
HCT: 41.7 % (ref 36.0–46.0)
Hemoglobin: 14.8 g/dL (ref 12.0–15.0)
MCH: 32.7 pg (ref 26.0–34.0)
MCHC: 35.5 g/dL (ref 30.0–36.0)
MCV: 92.1 fL (ref 80.0–100.0)
Platelets: 307 10*3/uL (ref 150–400)
RBC: 4.53 MIL/uL (ref 3.87–5.11)
RDW: 11.9 % (ref 11.5–15.5)
WBC: 5.6 10*3/uL (ref 4.0–10.5)

## 2018-08-16 LAB — GLUCOSE, CSF: Glucose, CSF: 53 mg/dL (ref 40–70)

## 2018-08-16 LAB — APTT: aPTT: 29 seconds (ref 24–36)

## 2018-08-16 MED ORDER — ACETAMINOPHEN 500 MG PO TABS
1000.0000 mg | ORAL_TABLET | Freq: Four times a day (QID) | ORAL | Status: DC | PRN
  Filled 2018-08-16: qty 2

## 2018-08-16 NOTE — Discharge Instructions (Signed)
Lumbar Puncture, Care After  This sheet gives you information about how to care for yourself after your procedure. Your health care provider may also give you more specific instructions. If you have problems or questions, contact your health care provider.  What can I expect after the procedure?  After the procedure, it is common to have:   Mild discomfort or pain at the puncture site.   A mild headache that is relieved with pain medicines.  Follow these instructions at home:  Activity     Lie down flat or rest for as long as directed by your health care provider.   Return to your normal activities as told by your health care provider. Ask your health care provider what activities are safe for you.   Avoid lifting anything heavier than 10 lb (4.5 kg) for at least 12 hours after the procedure.   Do not drive for 24 hours if you were given a medicine to help you relax (sedative) during your procedure.   Do not drive or use heavy machinery while taking prescription pain medicine.  Puncture site care   Remove or change your bandage (dressing) as told by your health care provider.   Check your puncture area every day for signs of infection. Check for:  ? More pain.  ? Redness or swelling.  ? Fluid or blood leaking from the puncture site.  ? Warmth.  ? Pus or a bad smell.  General instructions   Take over-the-counter and prescription medicines only as told by your health care provider.   Drink enough fluids to keep your urine clear or pale yellow. Your health care provider may recommend drinking caffeine to prevent a headache.   Keep all follow-up visits as told by your health care provider. This is important.  Contact a health care provider if:   You have fever or chills.   You have nausea or vomiting.   You have a headache that lasts for more than 2 days or does not get better with medicine.  Get help right away if:   You develop any of the following in your  legs:  ? Weakness.  ? Numbness.  ? Tingling.   You are unable to control when you urinate or have a bowel movement (incontinence).   You have signs of infection around your puncture site, such as:  ? More pain.  ? Redness or swelling.  ? Fluid or blood leakage.  ? Warmth.  ? Pus or a bad smell.   You are dizzy or you feel like you might faint.   You have a severe headache, especially when you sit or stand.  Summary   A lumbar puncture is a procedure in which a small needle is inserted into the lower back to remove fluid that surrounds the brain and spinal cord.   After this procedure, it is common to have a headache and pain around the needle insertion area.   Lying flat, staying hydrated, and drinking caffeine can help prevent headaches.   Monitor your needle insertion site for signs of infection, including warmth, fluid, or more pain.   Get help right away if you develop leg weakness, leg numbness, incontinence, or severe headaches.  This information is not intended to replace advice given to you by your health care provider. Make sure you discuss any questions you have with your health care provider.  Document Released: 03/05/2013 Document Revised: 04/13/2016 Document Reviewed: 04/13/2016  Elsevier Interactive Patient Education  2019 Elsevier Inc[employer]

## 2018-08-20 LAB — CSF CULTURE W GRAM STAIN: Culture: NO GROWTH

## 2018-08-20 LAB — OLIGOCLONAL BANDS, CSF + SERM

## 2018-08-21 ENCOUNTER — Other Ambulatory Visit: Payer: Self-pay

## 2018-08-21 ENCOUNTER — Encounter: Payer: Self-pay | Admitting: Internal Medicine

## 2018-08-21 ENCOUNTER — Ambulatory Visit: Payer: 59 | Admitting: Internal Medicine

## 2018-08-21 DIAGNOSIS — E785 Hyperlipidemia, unspecified: Secondary | ICD-10-CM

## 2018-08-21 DIAGNOSIS — H00014 Hordeolum externum left upper eyelid: Secondary | ICD-10-CM

## 2018-08-21 DIAGNOSIS — Z1329 Encounter for screening for other suspected endocrine disorder: Secondary | ICD-10-CM

## 2018-08-21 DIAGNOSIS — Z1231 Encounter for screening mammogram for malignant neoplasm of breast: Secondary | ICD-10-CM

## 2018-08-21 DIAGNOSIS — R838 Other abnormal findings in cerebrospinal fluid: Secondary | ICD-10-CM

## 2018-08-21 DIAGNOSIS — Z0000 Encounter for general adult medical examination without abnormal findings: Secondary | ICD-10-CM

## 2018-08-21 DIAGNOSIS — E559 Vitamin D deficiency, unspecified: Secondary | ICD-10-CM

## 2018-08-21 DIAGNOSIS — H02846 Edema of left eye, unspecified eyelid: Secondary | ICD-10-CM

## 2018-08-21 MED ORDER — ERYTHROMYCIN 5 MG/GM OP OINT: 1.0000 "application " | TOPICAL_OINTMENT | Freq: Every day | OPHTHALMIC | 0 refills | Status: DC

## 2018-08-21 MED ORDER — DOXYCYCLINE HYCLATE 100 MG PO TABS: 100.0000 mg | ORAL_TABLET | Freq: Two times a day (BID) | ORAL | 0 refills | Status: DC

## 2018-08-21 NOTE — Progress Notes (Signed)
Virtual Visit via Video Note  I connected with Leah [name]  on 08/21/18 at  9:15 AM EDT by a video enabled telemedicine application and verified that I am speaking with the correct person using two identifiers.  Location patient: home Location provider:work Persons participating in the virtual visit: patient, provider  I discussed the limitations of evaluation and management by telemedicine and the availability of in person appointments. The patient expressed understanding and agreed to proceed.   HPI: 1. 08/16/18 she had CSF LP with 4 + oligoclonal bands will disc with neurology to see if c/w MS  2. Yesterday she had left eyelid swelling upper redness and pain all day eye was puffy and felt like something in her left eye she has had a stype 10 years ago and tried warm compress which helped with swelling and redness is less  3. Chronic neck pain better s/p "burning of the nerves" in her neck with Dr. Sharlet Salina she had EMG 07/2018 + C6 radiculopathy on the right    ROS: See pertinent positives and negatives per HPI.  Past Medical History:  Diagnosis Date  . Anxiety   . Asthma   . Carpal tunnel syndrome    R hand   . Colon polyps   . Depression   . Eosinophilic esophagitis    noted in [place] with GI path report 09/29/14   . Esophageal stricture    s/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 Edwin Cap   . Herniated disc, cervical    s/p epidural injection  . Hyperlipidemia   . Low back pain   . Migraines     Past Surgical History:  Procedure Laterality Date  . CESAREAN SECTION    . COLONOSCOPY WITH PROPOFOL N/A 03/27/2017   Procedure: COLONOSCOPY WITH PROPOFOL;  Surgeon: Jonathon Bellows, MD;  Location: [facility] ENDOSCOPY;  Service: Gastroenterology;  Laterality: N/A;  . ESOPHAGOGASTRODUODENOSCOPY    . ESOPHAGOGASTRODUODENOSCOPY (EGD) WITH PROPOFOL N/A 03/27/2017   Procedure: ESOPHAGOGASTRODUODENOSCOPY (EGD) WITH PROPOFOL WITH DILATION;  Surgeon: Jonathon Bellows, MD;  Location:  [facility] ENDOSCOPY;  Service: Gastroenterology;  Laterality: N/A;  . THROAT SURGERY  2015  . UMBILICAL HERNIA REPAIR  2006    x 2   w mesh per pt    Family History  Problem Relation Age of Onset  . Breast cancer Mother   . Thyroid disease Mother   . Colon polyps Mother   . Prostate cancer Father   . Cancer - Prostate Father   . Breast cancer Maternal Grandmother     SOCIAL HX:  From [place]  Has kids   Current Outpatient Medications:  .  Acetaminophen (TYLENOL GO TABS EXTRA STRENGTH PO), Take by mouth every 8 (eight) hours., Disp: , Rfl:  .  cetirizine (ZYRTEC) 10 MG tablet, Take 1 tablet (10 mg total) by mouth daily as needed for allergies., Disp: 90 tablet, Rfl: 3 .  fluticasone (FLONASE) 50 MCG/ACT nasal spray, Place 2 sprays into both nostrils daily. Max 2 sprays, Disp: 16 g, Rfl: 12 .  gabapentin (NEURONTIN) 300 MG capsule, Take 300 mg by mouth at bedtime., Disp: , Rfl:  .  ibuprofen (ADVIL,MOTRIN) 800 MG tablet, Take 1 tablet (800 mg total) by mouth every 8 (eight) hours as needed (use sparingly)., Disp: 90 tablet, Rfl: 2 .  meclizine (ANTIVERT) 12.5 MG tablet, Take 1 tablet (12.5 mg total) by mouth 3 (three) times daily as needed for dizziness., Disp: 90 tablet, Rfl: 0 .  methocarbamol (ROBAXIN) 750 MG tablet,  Take 1 tablet (750 mg total) by mouth every 6 (six) hours as needed for muscle spasms., Disp: 120 tablet, Rfl: 5 .  montelukast (SINGULAIR) 10 MG tablet, Take 1 tablet (10 mg total) by mouth at bedtime., Disp: 90 tablet, Rfl: 3 .  omega-3 acid ethyl esters (LOVAZA) 1 g capsule, Take 2 capsules (2 g total) by mouth 2 (two) times daily., Disp: 120 capsule, Rfl: 3 .  omeprazole (PRILOSEC) 40 MG capsule, TAKE 1 CAPSULE BY MOUTH EVERY DAY, Disp: 90 capsule, Rfl: 0 .  predniSONE (DELTASONE) 10 MG tablet, Take 6 tablets  today, on day 2 take 5 tablets, day 3 take 4 tablets, day 4 take 3 tablets, day 5 take  2 tablets and 1 tablet the last day, Disp: 21 tablet, Rfl: 0 .  traMADol  (ULTRAM) 50 MG tablet, Take 1 tablet (50 mg total) by mouth every 12 (twelve) hours as needed., Disp: 10 tablet, Rfl: 0 .  traZODone (DESYREL) 50 MG tablet, Take 1-1.5 tablets (50-75 mg total) by mouth at bedtime as needed for sleep., Disp: 135 tablet, Rfl: 4 .  valACYclovir (VALTREX) 1000 MG tablet, Take 1 tablet (1,000 mg total) by mouth daily. X 5 days with outbreak as needed, Disp: 30 tablet, Rfl: 11 .  doxycycline (VIBRA-TABS) 100 MG tablet, Take 1 tablet (100 mg total) by mouth 2 (two) times daily. With food x 7 to 10 days, Disp: 20 tablet, Rfl: 0 .  erythromycin ophthalmic ointment, Place 1 application into the left eye at bedtime. X 4-7 days, Disp: 3.5 g, Rfl: 0  EXAM:  VITALS per patient if applicable:  GENERAL: alert, oriented, appears well and in no acute distress  HEENT: atraumatic, conjunttiva clear, no obvious abnormalities on inspection of external nose and ears Left upper eyelid stye   NECK: normal movements of the head and neck  LUNGS: on inspection no signs of respiratory distress, breathing rate appears normal, no obvious gross SOB, gasping or wheezing  CV: no obvious cyanosis  MS: moves all visible extremities without noticeable abnormality  PSYCH/NEURO: pleasant and cooperative, no obvious depression or anxiety, speech and thought processing grossly intact  ASSESSMENT AND PLAN:  Discussed the following assessment and plan:  Hordeolum externum of left upper eyelid - Plan: erythromycin ophthalmic ointment, doxycycline (VIBRA-TABS) 100 MG tablet Swelling of left eyelid - Plan: erythromycin ophthalmic ointment, doxycycline (VIBRA-TABS) 100 MG tablet -warm compress if not better refer to opthalmo  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 Manuella Ghazi to see if c/w MS   HM Had flu shot utd  Tdap 03/13/18 rec hep B vaccinewill call back to sch rec MMR vaccine  EGD/colonoscopy had 03/27/17 Dr. Bailey Mech diverticulosis, serrated polyps neg EGD for  eosinophillic esophagitis repeat colonoscopy due in 5years  mammo 10/10/17 negativeFH breast cancer in mom and m gm in 25s.consider genetic testing in future -referred mammogram Pap1/22/19 h/o abnormal neg pap neg HPV Former smoker congratulated on quitting  NS saw 01/23/18 cervical spinal cord kyphosis, cervical myelopathy f/u in 4 months referral to pm&R for pain tx Dr. Deetta Perla   I discussed the assessment and treatment plan with the patient. The patient was provided an opportunity to ask questions and all were answered. The patient agreed with the plan and demonstrated an understanding of the instructions.   The patient was advised to call back or seek an in-person evaluation if the symptoms worsen or if the condition fails to improve as anticipated.  Time spent 15 minutes  Olivia Mackie  Delane Ginger McLean-Scocuzza, MD

## 2018-08-21 NOTE — Patient Instructions (Signed)
Stye  A stye, also known as a hordeolum, is a bump that forms on an eyelid. It may look like a pimple next to the eyelash. A stye can form inside the eyelid (internal stye) or outside the eyelid (external stye). A stye can cause redness, swelling, and pain on the eyelid. Styes are very common. Anyone can get them at any age. They usually occur in just one eye, but you may have more than one in either eye. What are the causes? A stye is caused by an infection. The infection is almost always caused by bacteria called Staphylococcus aureus. This is a common type of bacteria that lives on the skin. An internal stye may result from an infected oil-producing gland inside the eyelid. An external stye may be caused by an infection at the base of the eyelash (hair follicle). What increases the risk? You are more likely to develop a stye if:  You have had a stye before.  You have any of these conditions: ? Diabetes. ? Red, itchy, inflamed eyelids (blepharitis). ? A skin condition such as seborrheic dermatitis or rosacea. ? High fat levels in your blood (lipids). What are the signs or symptoms? The most common symptom of a stye is eyelid pain. Internal styes are more painful than external styes. Other symptoms may include:  Painful swelling of your eyelid.  A scratchy feeling in your eye.  Tearing and redness of your eye.  Pus draining from the stye. How is this diagnosed? Your health care provider may be able to diagnose a stye just by examining your eye. The health care provider may also check to make sure:  You do not have a fever or other signs of a more serious infection.  The infection has not spread to other parts of your eye or areas around your eye. How is this treated? Most styes will clear up in a few days without treatment or with warm compresses applied to the area. You may need to use antibiotic drops or ointment to treat an infection. In some cases, if your stye does not heal  with routine treatment, your health care provider may drain pus from the stye using a thin blade or needle. This may be done if the stye is large, causing a lot of pain, or affecting your vision. Follow these instructions at home:  Take over-the-counter and prescription medicines only as told by your health care provider. This includes eye drops or ointments.  If you were prescribed an antibiotic medicine, apply or use it as told by your health care provider. Do not stop using the antibiotic even if your condition improves.  Apply a warm, wet cloth (warm compress) to your eye for 5-10 minutes, 4 times a day.  Clean the affected eyelid as directed by your health care provider.  Do not wear contact lenses or eye makeup until your stye has healed.  Do not try to pop or drain the stye.  Do not rub your eye. Contact a health care provider if:  You have chills or a fever.  Your stye does not go away after several days.  Your stye affects your vision.  Your eyeball becomes swollen, red, or painful. Get help right away if:  You have pain when moving your eye around. Summary  A stye is a bump that forms on an eyelid. It may look like a pimple next to the eyelash.  A stye can form inside the eyelid (internal stye) or outside the eyelid (external  stye). A stye can cause redness, swelling, and pain on the eyelid.  Your health care provider may be able to diagnose a stye just by examining your eye.  Apply a warm, wet cloth (warm compress) to your eye for 5-10 minutes, 4 times a day. This information is not intended to replace advice given to you by your health care provider. Make sure you discuss any questions you have with your health care provider. Document Released: 12/08/2004 Document Revised: 11/10/2016 Document Reviewed: 11/10/2016 Elsevier Interactive Patient Education  2019 [employer].  Multiple Sclerosis Multiple sclerosis (MS) is a disease of the brain, spinal cord, and  optic nerves (central nervous system). It causes the body's disease-fighting (immune) system to destroy the protective covering (myelin sheath) around nerves in the brain. When this happens, signals (nerve impulses) going to and from the brain and spinal cord do not get sent properly or may not get sent at all. There are several types of MS:  Relapsing-remitting MS. This is the most common type. This causes sudden attacks of symptoms. After an attack, you may recover completely until the next attack, or some symptoms may remain permanently.  Secondary progressive MS. This usually develops after the onset of relapsing-remitting MS. Similar to relapsing-remitting MS, this type also causes sudden attacks of symptoms. Attacks may be less frequent, but symptoms slowly get worse (progress) over time.  Primary progressive MS. This causes symptoms that steadily progress over time. This type of MS does not cause sudden attacks of symptoms. The age of onset of MS varies, but it often develops between 27-85 years of age. MS is a lifelong (chronic) condition. There is no cure, but treatment can help slow down the progression of the disease. What are the causes? The cause of this condition is not known. What increases the risk? You are more likely to develop this condition if:  You are a woman.  You have a relative with MS. However, the condition is not passed from parent to child (inherited).  You have a lack (deficiency) of vitamin D.  You smoke. MS is more common in the [place] than in the [place]. What are the signs or symptoms? Relapsing-remitting and secondary progressive MS cause symptoms to occur in episodes or attacks that may last weeks to months. There may be long periods between attacks in which there are almost no symptoms. Primary progressive MS causes symptoms to steadily progress after they develop. Symptoms of MS vary because of the many different ways it  affects the central nervous system. The main symptoms include:  Vision problems and eye pain.  Numbness.  Weakness.  Inability to move your arms, hands, feet, or legs (paralysis).  Balance problems.  Shaking that you cannot control (tremors).  Muscle spasms.  Problems with thinking (cognitive changes). MS can also cause symptoms that are associated with the disease, but are not always the direct result of an MS attack. They may include:  Inability to control urination or bowel movements (incontinence).  Headaches.  Fatigue.  Inability to tolerate heat.  Emotional changes.  Depression.  Pain. How is this diagnosed? This condition is diagnosed based on:  Your symptoms.  A neurological exam. This involves checking central nervous system function, such as nerve function, reflexes, and coordination.  MRIs of the brain and spinal cord.  Lab tests, including a lumbar puncture that tests the fluid that surrounds the brain and spinal cord (cerebrospinal fluid).  Tests to measure the electrical activity of the  brain in response to stimulation (evoked potentials). How is this treated? There is no cure for MS, but medicines can help decrease the number and frequency of attacks and help relieve nuisance symptoms. Treatment options may include:  Medicines that reduce the frequency of attacks. These medicines may be given by injection, by mouth (orally), or through an IV.  Medicines that reduce inflammation (steroids). These may provide short-term relief of symptoms.  Medicines to help control pain, depression, fatigue, or incontinence.  Vitamin D, if you have a deficiency.  Using devices to help you move around (assistive devices), such as braces, a cane, or a walker.  Physical therapy to strengthen and stretch your muscles.  Occupational therapy to help you with everyday tasks.  Alternative or complementary treatments such as exercise, massage, or acupuncture. Follow  these instructions at home:  Take over-the-counter and prescription medicines only as told by your health care provider.  Do not drive or use heavy machinery while taking prescription pain medicine.  Use assistive devices as recommended by your physical therapist or your health care provider.  Exercise as directed by your health care provider.  Return to your normal activities as told by your health care provider. Ask your health care provider what activities are safe for you.  Reach out for support. Share your feelings with friends, family, or a support group.  Keep all follow-up visits as told by your health care provider and therapists. This is important. Where to find more information  National Multiple Sclerosis Society: https://www.nationalmssociety.org Contact a health care provider if:  You feel depressed.  You develop new pain or numbness.  You have tremors.  You have problems with sexual function. Get help right away if:  You develop paralysis.  You develop numbness.  You have problems with your bladder or bowel function.  You develop double vision.  You lose vision in one or both eyes.  You develop suicidal thoughts.  You develop severe confusion. If you ever feel like you may hurt yourself or others, or have thoughts about taking your own life, get help right away. You can go to your nearest emergency department or call:  Your local emergency services (911 in the [place]  A suicide crisis helpline, such as the [place] at 380-836-5130. This is open 24 hours a day. Summary  Multiple sclerosis (MS) is a disease of the central nervous system that causes the body's immune system to destroy the protective covering (myelin sheath) around nerves in the brain.  There are 3 types of MS: relapsing-remitting, secondary progressive, and primary progressive. Relapsing-remitting and secondary progressive MS cause symptoms to occur in  episodes or attacks that may last weeks to months. Primary progressive MS causes symptoms to steadily progress after they develop.  There is no cure for MS, but medicines can help decrease the number and frequency of attacks and help relieve nuisance symptoms. Treatment may also include physical or occupational therapy.  If you develop numbness, paralysis, vision problems, or other neurological symptoms, get help right away. This information is not intended to replace advice given to you by your health care provider. Make sure you discuss any questions you have with your health care provider. Document Released: 02/26/2000 Document Revised: 05/09/2016 Document Reviewed: 05/09/2016 Elsevier Interactive Patient Education  2019 [employer].

## 2018-09-10 ENCOUNTER — Ambulatory Visit (INDEPENDENT_AMBULATORY_CARE_PROVIDER_SITE_OTHER): Payer: 59 | Admitting: Psychology

## 2018-09-10 DIAGNOSIS — F329 Major depressive disorder, single episode, unspecified: Secondary | ICD-10-CM | POA: Diagnosis not present

## 2018-09-10 DIAGNOSIS — F419 Anxiety disorder, unspecified: Secondary | ICD-10-CM | POA: Diagnosis not present

## 2018-09-17 ENCOUNTER — Encounter: Payer: Self-pay | Admitting: Internal Medicine

## 2018-09-19 ENCOUNTER — Telehealth: Payer: Self-pay | Admitting: *Deleted

## 2018-09-19 DIAGNOSIS — Z20822 Contact with and (suspected) exposure to covid-19: Secondary | ICD-10-CM

## 2018-09-19 NOTE — Telephone Encounter (Signed)
Lm for pt to return call to schedule covid testing @ [phone] M-F 7a-7p. Order placed

## 2018-09-20 ENCOUNTER — Other Ambulatory Visit: Payer: Self-pay

## 2018-09-20 ENCOUNTER — Ambulatory Visit: Payer: Self-pay | Admitting: Internal Medicine

## 2018-09-20 DIAGNOSIS — Z20822 Contact with and (suspected) exposure to covid-19: Secondary | ICD-10-CM

## 2018-09-20 NOTE — Telephone Encounter (Signed)
Pt. Called back and scheduled for today. 

## 2018-09-21 ENCOUNTER — Telehealth: Payer: Self-pay | Admitting: Internal Medicine

## 2018-09-21 ENCOUNTER — Encounter: Payer: Self-pay | Admitting: Internal Medicine

## 2018-09-21 ENCOUNTER — Other Ambulatory Visit: Payer: Self-pay | Admitting: Internal Medicine

## 2018-09-21 DIAGNOSIS — N898 Other specified noninflammatory disorders of vagina: Secondary | ICD-10-CM

## 2018-09-21 NOTE — Telephone Encounter (Signed)
Call pt to set up urine lab only dirty here for Monday   Thanks tSM

## 2018-09-21 NOTE — Telephone Encounter (Signed)
Lab has been scheduled 

## 2018-09-24 ENCOUNTER — Other Ambulatory Visit: Payer: Self-pay

## 2018-09-24 ENCOUNTER — Other Ambulatory Visit: Payer: 59

## 2018-09-24 ENCOUNTER — Other Ambulatory Visit (INDEPENDENT_AMBULATORY_CARE_PROVIDER_SITE_OTHER): Payer: 59

## 2018-09-24 ENCOUNTER — Other Ambulatory Visit (HOSPITAL_COMMUNITY)
Admission: RE | Admit: 2018-09-24 | Discharge: 2018-09-24 | Disposition: A | Discharge status: Home / Self Care | Payer: 59 | Source: Ambulatory Visit | Attending: Internal Medicine | Admitting: Internal Medicine

## 2018-09-24 DIAGNOSIS — N898 Other specified noninflammatory disorders of vagina: Secondary | ICD-10-CM

## 2018-09-25 ENCOUNTER — Other Ambulatory Visit: Payer: Self-pay | Admitting: Internal Medicine

## 2018-09-25 DIAGNOSIS — R110 Nausea: Secondary | ICD-10-CM

## 2018-09-25 DIAGNOSIS — H81399 Other peripheral vertigo, unspecified ear: Secondary | ICD-10-CM

## 2018-09-25 MED ORDER — MECLIZINE HCL 12.5 MG PO TABS: 12.5000 mg | ORAL_TABLET | Freq: Three times a day (TID) | ORAL | 1 refills | Status: DC | PRN

## 2018-09-26 ENCOUNTER — Other Ambulatory Visit: Payer: Self-pay | Admitting: Internal Medicine

## 2018-09-26 DIAGNOSIS — A6000 Herpesviral infection of urogenital system, unspecified: Secondary | ICD-10-CM

## 2018-09-26 LAB — URINE CYTOLOGY ANCILLARY ONLY
Chlamydia: NEGATIVE
Neisseria Gonorrhea: NEGATIVE
Trichomonas: NEGATIVE

## 2018-09-26 MED ORDER — VALACYCLOVIR HCL 1 G PO TABS: 1000.0000 mg | ORAL_TABLET | Freq: Every day | ORAL | 11 refills | Status: DC

## 2018-09-27 ENCOUNTER — Encounter: Payer: Self-pay | Admitting: Internal Medicine

## 2018-09-27 LAB — NOVEL CORONAVIRUS, NAA: SARS-CoV-2, NAA: NOT DETECTED

## 2018-09-28 LAB — URINE CYTOLOGY ANCILLARY ONLY
Bacterial vaginitis: NEGATIVE
Candida vaginitis: NEGATIVE

## 2018-10-04 ENCOUNTER — Other Ambulatory Visit (INDEPENDENT_AMBULATORY_CARE_PROVIDER_SITE_OTHER): Payer: 59

## 2018-10-04 ENCOUNTER — Other Ambulatory Visit: Payer: Self-pay

## 2018-10-04 DIAGNOSIS — E785 Hyperlipidemia, unspecified: Secondary | ICD-10-CM | POA: Diagnosis not present

## 2018-10-04 DIAGNOSIS — Z0000 Encounter for general adult medical examination without abnormal findings: Secondary | ICD-10-CM | POA: Diagnosis not present

## 2018-10-04 DIAGNOSIS — Z1329 Encounter for screening for other suspected endocrine disorder: Secondary | ICD-10-CM | POA: Diagnosis not present

## 2018-10-04 DIAGNOSIS — E559 Vitamin D deficiency, unspecified: Secondary | ICD-10-CM | POA: Diagnosis not present

## 2018-10-04 LAB — CBC WITH DIFFERENTIAL/PLATELET
Basophils Absolute: 0.1 10*3/uL (ref 0.0–0.1)
Basophils Relative: 0.8 % (ref 0.0–3.0)
Eosinophils Absolute: 0.1 10*3/uL (ref 0.0–0.7)
Eosinophils Relative: 2.2 % (ref 0.0–5.0)
HCT: 46 % (ref 36.0–46.0)
Hemoglobin: 16.1 g/dL — ABNORMAL HIGH (ref 12.0–15.0)
Lymphocytes Relative: 22.9 % (ref 12.0–46.0)
Lymphs Abs: 1.6 10*3/uL (ref 0.7–4.0)
MCHC: 35 g/dL (ref 30.0–36.0)
MCV: 94.2 fl (ref 78.0–100.0)
Monocytes Absolute: 0.6 10*3/uL (ref 0.1–1.0)
Monocytes Relative: 8.7 % (ref 3.0–12.0)
Neutro Abs: 4.6 10*3/uL (ref 1.4–7.7)
Neutrophils Relative %: 65.4 % (ref 43.0–77.0)
Platelets: 335 10*3/uL (ref 150.0–400.0)
RBC: 4.88 Mil/uL (ref 3.87–5.11)
RDW: 11.9 % (ref 11.5–15.5)
WBC: 6.9 10*3/uL (ref 4.0–10.5)

## 2018-10-04 LAB — COMPREHENSIVE METABOLIC PANEL
ALT: 14 U/L (ref 0–35)
AST: 14 U/L (ref 0–37)
Albumin: 4.4 g/dL (ref 3.5–5.2)
Alkaline Phosphatase: 42 U/L (ref 39–117)
BUN: 17 mg/dL (ref 6–23)
CO2: 25 mEq/L (ref 19–32)
Calcium: 9.7 mg/dL (ref 8.4–10.5)
Chloride: 103 mEq/L (ref 96–112)
Creatinine, Ser: 0.67 mg/dL (ref 0.40–1.20)
GFR: 92.92 mL/min (ref >60.00)
Glucose, Bld: 99 mg/dL (ref 70–99)
Potassium: 4 mEq/L (ref 3.5–5.1)
Sodium: 138 mEq/L (ref 135–145)
Total Bilirubin: 0.5 mg/dL (ref 0.2–1.2)
Total Protein: 6.8 g/dL (ref 6.0–8.3)

## 2018-10-04 LAB — LIPID PANEL
Cholesterol: 288 mg/dL — ABNORMAL HIGH (ref 0–200)
HDL: 51.6 mg/dL (ref >39.00)
NonHDL: 235.97
Total CHOL/HDL Ratio: 6
Triglycerides: 219 mg/dL — ABNORMAL HIGH (ref 0.0–149.0)
VLDL: 43.8 mg/dL — ABNORMAL HIGH (ref 0.0–40.0)

## 2018-10-04 LAB — LDL CHOLESTEROL, DIRECT: Direct LDL: 176 mg/dL

## 2018-10-04 LAB — T4, FREE: Free T4: 0.71 ng/dL (ref 0.60–1.60)

## 2018-10-04 LAB — TSH: TSH: 1.72 u[IU]/mL (ref 0.35–4.50)

## 2018-10-04 LAB — VITAMIN D 25 HYDROXY (VIT D DEFICIENCY, FRACTURES): VITD: 20.08 ng/mL — ABNORMAL LOW (ref 30.00–100.00)

## 2018-10-09 ENCOUNTER — Ambulatory Visit (INDEPENDENT_AMBULATORY_CARE_PROVIDER_SITE_OTHER): Payer: 59 | Admitting: Psychology

## 2018-10-09 DIAGNOSIS — F329 Major depressive disorder, single episode, unspecified: Secondary | ICD-10-CM

## 2018-10-09 DIAGNOSIS — F419 Anxiety disorder, unspecified: Secondary | ICD-10-CM | POA: Diagnosis not present

## 2018-10-12 ENCOUNTER — Ambulatory Visit
Admission: RE | Admit: 2018-10-12 | Discharge: 2018-10-12 | Disposition: A | Discharge status: Home / Self Care | Payer: 59 | Source: Ambulatory Visit | Attending: Internal Medicine | Admitting: Internal Medicine

## 2018-10-12 DIAGNOSIS — Z1231 Encounter for screening mammogram for malignant neoplasm of breast: Secondary | ICD-10-CM | POA: Insufficient documentation

## 2018-10-15 ENCOUNTER — Other Ambulatory Visit: Payer: Self-pay | Admitting: Internal Medicine

## 2018-10-15 DIAGNOSIS — R928 Other abnormal and inconclusive findings on diagnostic imaging of breast: Secondary | ICD-10-CM

## 2018-10-15 DIAGNOSIS — N6489 Other specified disorders of breast: Secondary | ICD-10-CM

## 2018-11-22 ENCOUNTER — Ambulatory Visit: Payer: 59 | Admitting: Internal Medicine

## 2018-12-11 ENCOUNTER — Ambulatory Visit (INDEPENDENT_AMBULATORY_CARE_PROVIDER_SITE_OTHER): Payer: Medicaid Other | Admitting: Internal Medicine

## 2018-12-11 ENCOUNTER — Other Ambulatory Visit: Payer: Self-pay

## 2018-12-11 ENCOUNTER — Encounter: Payer: Self-pay | Admitting: Internal Medicine

## 2018-12-11 ENCOUNTER — Telehealth: Payer: Self-pay

## 2018-12-11 DIAGNOSIS — G43909 Migraine, unspecified, not intractable, without status migrainosus: Secondary | ICD-10-CM

## 2018-12-11 DIAGNOSIS — R1310 Dysphagia, unspecified: Secondary | ICD-10-CM

## 2018-12-11 DIAGNOSIS — E559 Vitamin D deficiency, unspecified: Secondary | ICD-10-CM

## 2018-12-11 DIAGNOSIS — M62838 Other muscle spasm: Secondary | ICD-10-CM

## 2018-12-11 DIAGNOSIS — G8929 Other chronic pain: Secondary | ICD-10-CM | POA: Insufficient documentation

## 2018-12-11 DIAGNOSIS — R930 Abnormal findings on diagnostic imaging of skull and head, not elsewhere classified: Secondary | ICD-10-CM | POA: Insufficient documentation

## 2018-12-11 DIAGNOSIS — R1319 Other dysphagia: Secondary | ICD-10-CM

## 2018-12-11 DIAGNOSIS — R42 Dizziness and giddiness: Secondary | ICD-10-CM

## 2018-12-11 MED ORDER — METHOCARBAMOL 750 MG PO TABS: 750.0000 mg | ORAL_TABLET | Freq: Four times a day (QID) | ORAL | 5 refills | Status: DC | PRN

## 2018-12-11 MED ORDER — OMEPRAZOLE 40 MG PO CPDR: 40.0000 mg | DELAYED_RELEASE_CAPSULE | Freq: Every day | ORAL | 3 refills | Status: DC

## 2018-12-11 NOTE — Telephone Encounter (Signed)
Copied from [place] [phone]. Topic: General - Other >> Dec 11, 2018  8:26 AM Leward Quan A wrote: Reason for CRM: Patient called about her appointment for this morning states that she just wanted to know if she will still be able to be seen and also to inform that she will be home all day so she can be reached at Ph# [phone]

## 2018-12-11 NOTE — Telephone Encounter (Signed)
Called and spoke to patient after Internet and phone service was restored.  Patient said that she was able to have a phone visit with her PCP this morning.

## 2018-12-11 NOTE — Progress Notes (Addendum)
Telephone Note  I connected with Leah [name]  on 12/11/18 at  9:00 AM EDT by telephone and verified that I am speaking with the correct person using two identifiers.  Location patient: home Location provider:work or home office Persons participating in the virtual visit: patient, provider  I discussed the limitations of evaluation and management by telemedicine and the availability of in person appointments. The patient expressed understanding and agreed to proceed.   HPI: 1. 1.5 weeks ago had migraine then this week vertigo she is working in [employer] now and is increasing water intake and has cut back on caffeine but this sat 3 am woke up with increased dizziness and has dizziness with looking up at the TV. She tried tramadol, ibuprofen and muscle relaxers which she takes for chronic pain but did not try meclizine MRI 09/2018 Dr. Manuella Ghazi doesn't think MS and wanted referral to [facility] neurology but pt hasnt heard   Of note in the past execedrin migraines made h/a worse   2. 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 She reports taking robaxin during the day does not make sleepy and using zanaflex 2-4 mg qhs prn from PM&R and tramadol at night which helps with sleep    ROS: See pertinent positives and negatives per HPI.  Past Medical History:  Diagnosis Date  . Anxiety   . Asthma   . Carpal tunnel syndrome    R hand   . Colon polyps   . Depression   . Eosinophilic esophagitis    noted in [place] with GI path report 09/29/14   . Esophageal stricture    s/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 Edwin Cap   . Herniated disc, cervical    s/p epidural injection  . Hyperlipidemia   . Low back pain   . Migraines     Past Surgical History:  Procedure Laterality Date  . CESAREAN SECTION    . COLONOSCOPY WITH PROPOFOL N/A 03/27/2017   Procedure: COLONOSCOPY WITH PROPOFOL;  Surgeon: Jonathon Bellows, MD;   Location[facility] ENDOSCOPY;  Service: Gastroenterology;  Laterality: N/A;  . ESOPHAGOGASTRODUODENOSCOPY    . ESOPHAGOGASTRODUODENOSCOPY (EGD) WITH PROPOFOL N/A 03/27/2017   Procedure: ESOPHAGOGASTRODUODENOSCOPY (EGD) WITH PROPOFOL WITH DILATION;  Surgeon: Jonathon Bellows, MD;  Location: [facility] ENDOSCOPY;  Service: Gastroenterology;  Laterality: N/A;  . THROAT SURGERY  2015  . UMBILICAL HERNIA REPAIR  2006    x 2   w mesh per pt    Family History  Problem Relation Age of Onset  . Breast cancer Mother   . Thyroid disease Mother   . Colon polyps Mother   . Prostate cancer Father   . Cancer - Prostate Father   . 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  tiZANidine (ZANAFLEX) 4 MG tablet, Take 2-4 mg by mouth 2 (two) times daily as needed for muscle spasms., Disp: , Rfl:  .  Acetaminophen (TYLENOL GO TABS EXTRA STRENGTH PO), Take by mouth every 8 (eight) hours., Disp: , Rfl:  .  cetirizine (ZYRTEC) 10 MG tablet, Take 1 tablet (10 mg total) by mouth daily as needed for allergies., Disp: 90 tablet, Rfl: 3 .  fluticasone (FLONASE) 50 MCG/ACT nasal spray, Place 2 sprays into both nostrils daily. Max 2 sprays, Disp: 16 g, Rfl: 12 .  gabapentin (NEURONTIN) 300 MG capsule, Take 300 mg by mouth at bedtime., Disp: , Rfl:  .  ibuprofen (ADVIL,MOTRIN) 800 MG tablet, Take 1 tablet (800 mg total) by mouth every 8 (eight) hours as needed (use sparingly)., Disp: 90 tablet, Rfl: 2 .  meclizine (ANTIVERT) 12.5 MG tablet, Take 1 tablet (12.5 mg total) by mouth 3 (three) times daily as needed for dizziness., Disp: 90 tablet, Rfl: 1 .  methocarbamol (ROBAXIN) 750 MG tablet, Take 1 tablet (750 mg total) by mouth every 6 (six) hours as needed for muscle spasms., Disp: 120 tablet, Rfl: 5 .  montelukast (SINGULAIR) 10 MG tablet, Take 1 tablet (10 mg total) by mouth at bedtime., Disp: 90 tablet, Rfl: 3 .  omega-3 acid  ethyl esters (LOVAZA) 1 g capsule, Take 2 capsules (2 g total) by mouth 2 (two) times daily., Disp: 120 capsule, Rfl: 3 .  omeprazole (PRILOSEC) 40 MG capsule, Take 1 capsule (40 mg total) by mouth daily. 30 minutes before food, Disp: 90 capsule, Rfl: 3 .  traMADol (ULTRAM) 50 MG tablet, Take 1 tablet (50 mg total) by mouth every 12 (twelve) hours as needed., Disp: 10 tablet, Rfl: 0 .  traZODone (DESYREL) 50 MG tablet, Take 1-1.5 tablets (50-75 mg total) by mouth at bedtime as needed for sleep., Disp: 135 tablet, Rfl: 4 .  valACYclovir (VALTREX) 1000 MG tablet, Take 1 tablet (1,000 mg total) by mouth daily. X 5 days with outbreak as needed, Disp: 30 tablet, Rfl: 11  EXAM:  VITALS per patient if applicable:  GENERAL: alert, oriented, appears well and in no acute distress  PSYCH/NEURO: pleasant and cooperative, no obvious depression or anxiety, speech and thought processing grossly intact  ASSESSMENT AND PLAN:  Discussed the following assessment and plan:  Abnormal MRI of head MRI 09/2018 neurology Dr. Manuella Ghazi does not think MS -referred [facility] further w/u has not heard she will also need neurology to weight in on dizziness, h/a and migraines and further manage   Muscle spasm - Plan: methocarbamol (ROBAXIN) 750 MG tablet  Esophageal dysphagia - Plan: omeprazole (PRILOSEC) 40 MG capsule  Other chronic pain-f/u with Dr. Sharlet Salina PM&R   HM Had flu shotcall to schedule RN visit   Tdap12/31/19 rec hep B vaccinewill call back to sch rec MMR vaccine  EGD/colonoscopy had 03/27/17 Dr. 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 Remo Lipps cook   MRI 10/10/18 abnormal neurology does not think MS rec [facility]   [facility] seen 01/04/19 stage 1 left breast cancer ER+ Dr. Marlou Starks breast conservation surgery and sentinel node bx f/u with h/o after surgery    -we discussed possible serious and likely etiologies, options for evaluation and workup, limitations of telemedicine visit vs in person visit, treatment, treatment risks and precautions. Pt prefers to treat via telemedicine empirically rather then risking or undertaking an in person visit at this moment. Patient agrees to seek prompt in person care if worsening, new symptoms arise, or if is not improving with treatment.   I discussed the assessment and treatment  plan with the patient. The patient was provided an opportunity to ask questions and all were answered. The patient agreed with the plan and demonstrated an understanding of the instructions.   The patient was advised to call back or seek an in-person evaluation if the symptoms worsen or if the condition fails to improve as anticipated.  Time spent 20 minutes  Delorise Jackson, MD

## 2018-12-15 IMAGING — US US BREAST*L* LIMITED INC AXILLA
1 series · 6 of 6 positions shown · non-contrast
Comparison: Previous exam(s).

CLINICAL DATA: Follow-up for probably benign left breast mass.
Patient reports a strong family history of breast cancer including
in her mother diagnosed in mid 50s as well as maternal grandmother
diagnosed image 50s.

EXAM:
DIGITAL DIAGNOSTIC BILATERAL MAMMOGRAM WITH CAD AND TOMO
LEFT BREAST ULTRASOUND

[Series 1: us breast*left* limited inc axilla · 0.05mm/px · 6 of 6 slices shown]
[im 1/6]
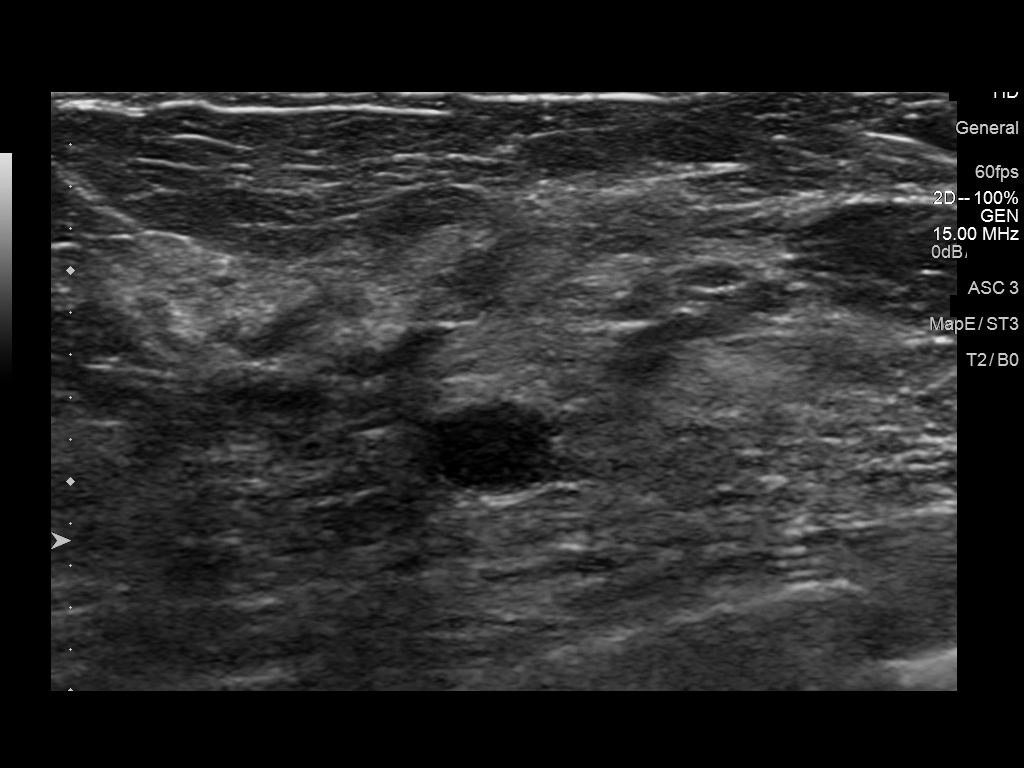
[im 2/6]
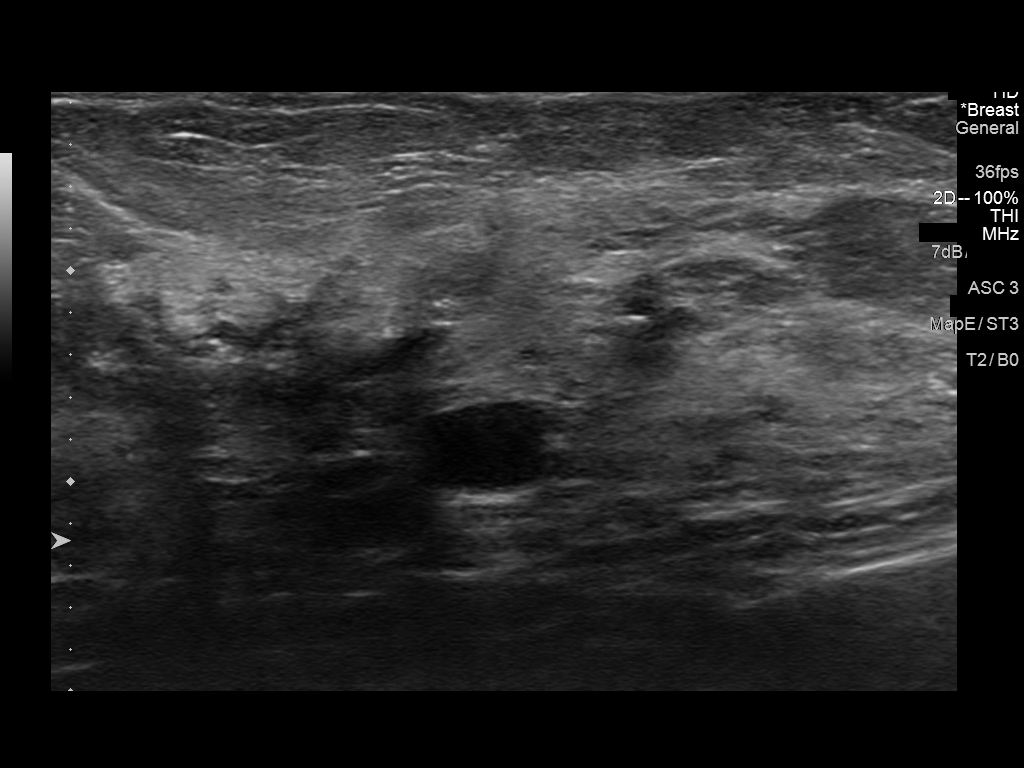
[im 3/6]
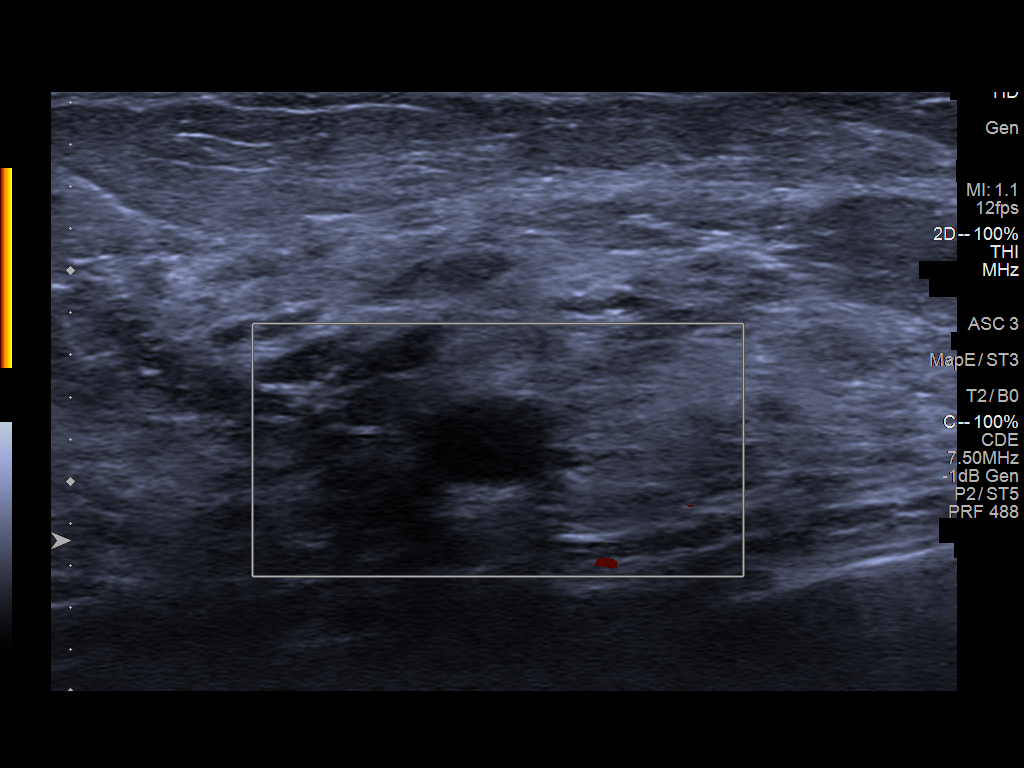
[im 4/6]
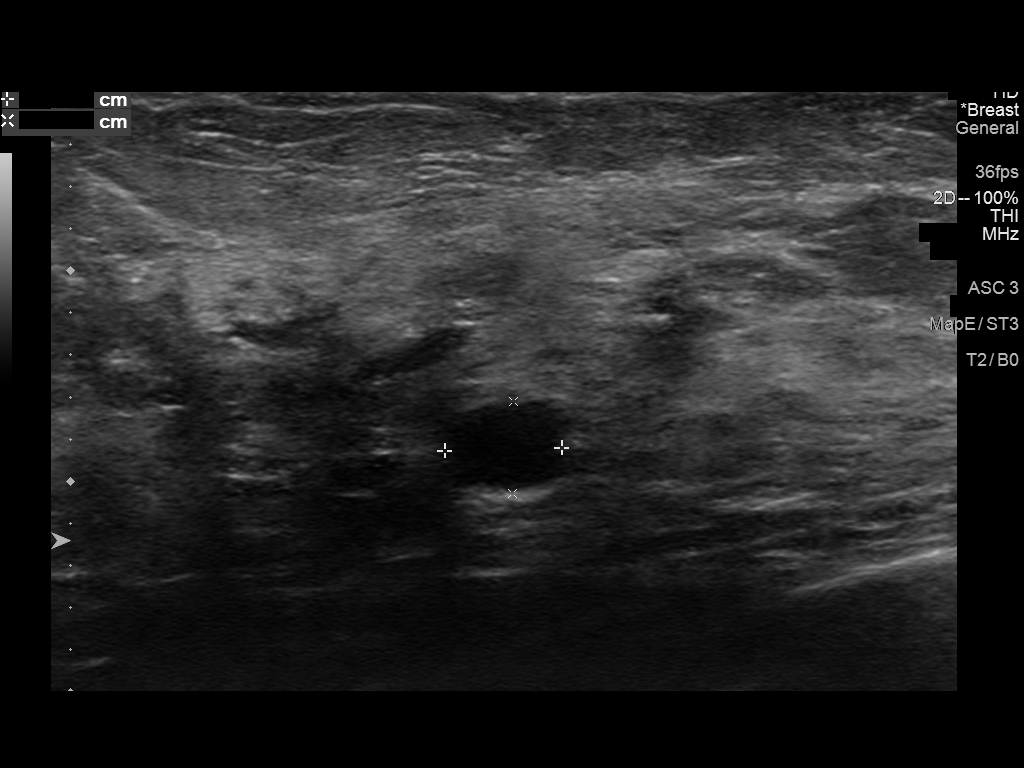
[im 5/6]
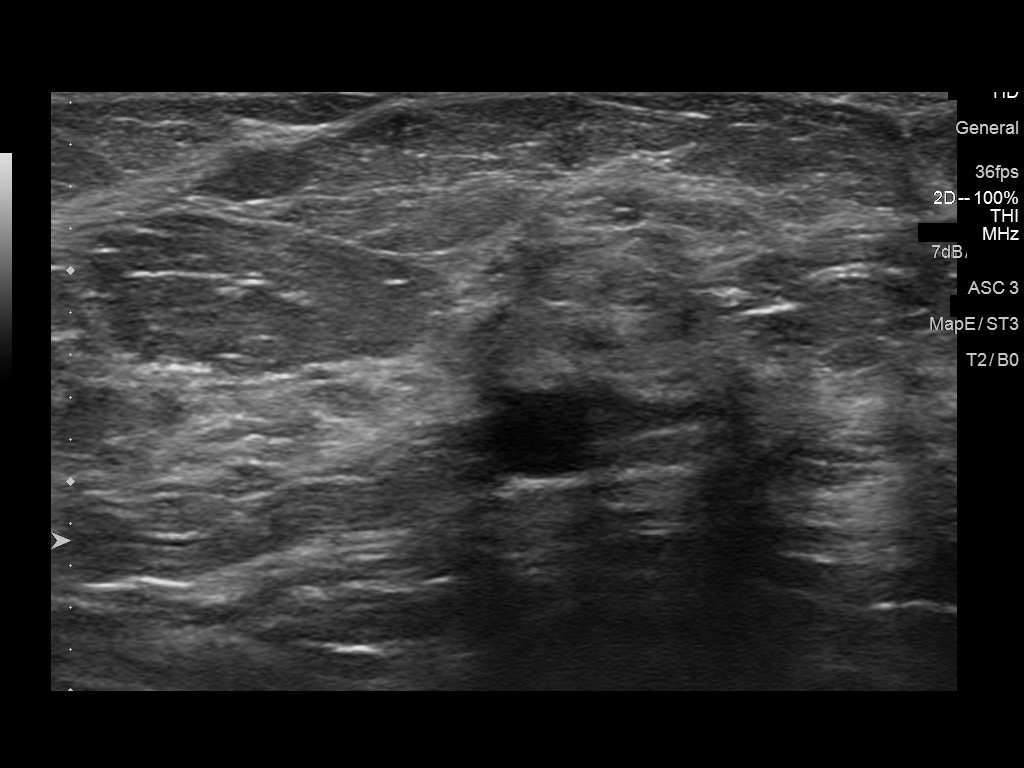
[im 6/6]
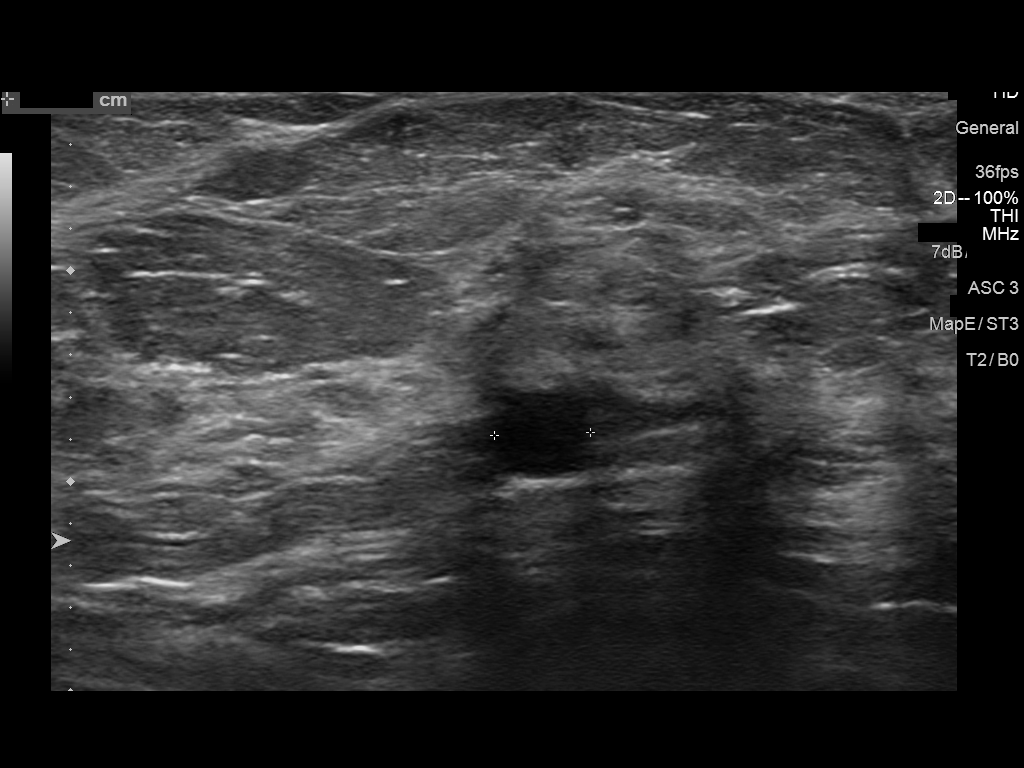

[6 of 6 positions shown; findings below may reference images not displayed]

ACR Breast Density Category c: The breast tissue is heterogeneously
dense, which may obscure small masses.
FINDINGS: No suspicious masses or calcifications are seen in either breast.
The previously seen oval mass in the outer left breast appears
smaller to nearly resolved on today's images.

Mammographic images were processed with CAD.

Targeted ultrasound of the outer left breast was performed
demonstrating a cyst at the 3-3:30 position 6 cm from nipple
measuring 0.6 x 0.4 x 0.5 cm, smaller in size when compared to the
prior exam and considered benign.
IMPRESSION: No findings of malignancy in either breast.

RECOMMENDATION:
1.  Screening mammogram in one year.(Code:II-6-3P4)

2. Patient reports a strong family history of breast cancer
including in her mother diagnosed with breast cancer in her mid 50s
as well as grandmother diagnosed mid 50s. According to the
[place] breast Cancer Risk evaluation tool this patient's
lifetime risk of breast cancer is calculated to be 29.1%. The
American Cancer Society recommends annual MRI and mammography in
patients with an estimated lifetime risk of developing breast cancer
greater than 20 - 25%, or who are known or suspected to be positive
for the breast cancer gene.

I have discussed the findings and recommendations with the patient.
Results were also provided in writing at the conclusion of the
visit. If applicable, a reminder letter will be sent to the patient
regarding the next appointment.

BI-RADS CATEGORY  2: Benign.

## 2018-12-18 ENCOUNTER — Ambulatory Visit
Admission: RE | Admit: 2018-12-18 | Discharge: 2018-12-18 | Disposition: A | Discharge status: Home / Self Care | Payer: Medicaid Other | Source: Ambulatory Visit | Attending: Internal Medicine | Admitting: Internal Medicine

## 2018-12-18 DIAGNOSIS — N6489 Other specified disorders of breast: Secondary | ICD-10-CM

## 2018-12-18 DIAGNOSIS — R928 Other abnormal and inconclusive findings on diagnostic imaging of breast: Secondary | ICD-10-CM | POA: Insufficient documentation

## 2018-12-19 ENCOUNTER — Other Ambulatory Visit: Payer: Self-pay | Admitting: Internal Medicine

## 2018-12-19 DIAGNOSIS — N6320 Unspecified lump in the left breast, unspecified quadrant: Secondary | ICD-10-CM

## 2018-12-19 DIAGNOSIS — R928 Other abnormal and inconclusive findings on diagnostic imaging of breast: Secondary | ICD-10-CM

## 2018-12-21 ENCOUNTER — Telehealth: Payer: Self-pay

## 2018-12-21 NOTE — Telephone Encounter (Signed)
Copied from [place] [phone]. Topic: [employer] and exercise psychologist Patient (Clinic Use ONLY) >> Dec 21, 2018  4:48 PM Pauline Good wrote: Reason for CRM: pt calling to get her lab results

## 2018-12-24 NOTE — Telephone Encounter (Signed)
Please see lab note.

## 2018-12-25 ENCOUNTER — Ambulatory Visit
Admission: RE | Admit: 2018-12-25 | Discharge: 2018-12-25 | Disposition: A | Discharge status: Home / Self Care | Payer: Medicaid Other | Source: Ambulatory Visit | Attending: Internal Medicine | Admitting: Internal Medicine

## 2018-12-25 DIAGNOSIS — N6320 Unspecified lump in the left breast, unspecified quadrant: Secondary | ICD-10-CM

## 2018-12-25 DIAGNOSIS — Z170 Estrogen receptor positive status [ER+]: Secondary | ICD-10-CM | POA: Diagnosis not present

## 2018-12-25 DIAGNOSIS — R928 Other abnormal and inconclusive findings on diagnostic imaging of breast: Secondary | ICD-10-CM

## 2018-12-25 DIAGNOSIS — C50212 Malignant neoplasm of upper-inner quadrant of left female breast: Secondary | ICD-10-CM | POA: Diagnosis not present

## 2018-12-25 DIAGNOSIS — N6321 Unspecified lump in the left breast, upper outer quadrant: Secondary | ICD-10-CM | POA: Diagnosis present

## 2018-12-25 HISTORY — PX: BREAST BIOPSY: SHX20

## 2018-12-27 ENCOUNTER — Encounter: Payer: Self-pay | Admitting: *Deleted

## 2018-12-27 DIAGNOSIS — C50912 Malignant neoplasm of unspecified site of left female breast: Secondary | ICD-10-CM

## 2018-12-28 ENCOUNTER — Other Ambulatory Visit: Payer: Self-pay | Admitting: *Deleted

## 2018-12-28 ENCOUNTER — Encounter: Payer: Self-pay | Admitting: *Deleted

## 2018-12-28 ENCOUNTER — Other Ambulatory Visit: Payer: Self-pay | Admitting: Internal Medicine

## 2018-12-28 ENCOUNTER — Other Ambulatory Visit: Payer: Self-pay | Admitting: Oncology

## 2018-12-28 ENCOUNTER — Encounter: Payer: Self-pay | Admitting: Internal Medicine

## 2018-12-28 DIAGNOSIS — C50912 Malignant neoplasm of unspecified site of left female breast: Secondary | ICD-10-CM

## 2018-12-28 DIAGNOSIS — C50919 Malignant neoplasm of unspecified site of unspecified female breast: Secondary | ICD-10-CM | POA: Insufficient documentation

## 2018-12-28 LAB — SURGICAL PATHOLOGY

## 2018-12-28 NOTE — Progress Notes (Signed)
Called patient to establish navigation services.  Patient is newly diagnosed with invasive mammary carcinoma.  She does not have a surgical preference.  Her PCP Olivia Mackie McLean-Scocuzza's,  preference is to refer to Dr. Alonna Minium.  I discussed this with the patient.  She does not want to drive to [facility] for her care.  Will see if Dr. Alonna Minium has any availablity in the [place] office. Will call patient back with her appointment.

## 2018-12-28 NOTE — Progress Notes (Signed)
Called and talked to patient today.  I have scheduled her to see Dr. Janese Banks on 01/03/19 and Dr. Marlou Starks for surgical consult on 01/03/19.  We discussed insurance needs.  Currently she has Family Planning Medicaid and some type of short term insurance.  Will call the state and discuss Medicaid.  Will meet patient at her medical oncology visit and give her educational material at that time.

## 2019-01-02 ENCOUNTER — Other Ambulatory Visit: Payer: Self-pay

## 2019-01-02 ENCOUNTER — Encounter: Payer: Self-pay | Admitting: Oncology

## 2019-01-02 NOTE — Progress Notes (Signed)
Patient pre screened for office appointment, no questions or concerns today. 

## 2019-01-03 ENCOUNTER — Encounter: Payer: Self-pay | Admitting: Oncology

## 2019-01-03 ENCOUNTER — Inpatient Hospital Stay: Payer: Medicaid Other

## 2019-01-03 ENCOUNTER — Other Ambulatory Visit: Payer: Self-pay

## 2019-01-03 ENCOUNTER — Encounter: Payer: Self-pay | Admitting: *Deleted

## 2019-01-03 ENCOUNTER — Inpatient Hospital Stay: Payer: Medicaid Other | Attending: Oncology | Admitting: Oncology

## 2019-01-03 VITALS — BP 127/80 | HR 68 | Temp 98.3°F | Resp 18 | Wt 170.0 lb

## 2019-01-03 DIAGNOSIS — Z79899 Other long term (current) drug therapy: Secondary | ICD-10-CM | POA: Insufficient documentation

## 2019-01-03 DIAGNOSIS — Z791 Long term (current) use of non-steroidal anti-inflammatories (NSAID): Secondary | ICD-10-CM

## 2019-01-03 DIAGNOSIS — Z87891 Personal history of nicotine dependence: Secondary | ICD-10-CM | POA: Insufficient documentation

## 2019-01-03 DIAGNOSIS — Z7189 Other specified counseling: Secondary | ICD-10-CM | POA: Insufficient documentation

## 2019-01-03 DIAGNOSIS — Z8042 Family history of malignant neoplasm of prostate: Secondary | ICD-10-CM | POA: Diagnosis not present

## 2019-01-03 DIAGNOSIS — C50212 Malignant neoplasm of upper-inner quadrant of left female breast: Secondary | ICD-10-CM | POA: Diagnosis not present

## 2019-01-03 DIAGNOSIS — Z803 Family history of malignant neoplasm of breast: Secondary | ICD-10-CM | POA: Insufficient documentation

## 2019-01-03 DIAGNOSIS — K219 Gastro-esophageal reflux disease without esophagitis: Secondary | ICD-10-CM | POA: Diagnosis not present

## 2019-01-03 DIAGNOSIS — E785 Hyperlipidemia, unspecified: Secondary | ICD-10-CM | POA: Insufficient documentation

## 2019-01-03 DIAGNOSIS — Z170 Estrogen receptor positive status [ER+]: Secondary | ICD-10-CM | POA: Insufficient documentation

## 2019-01-03 NOTE — Progress Notes (Signed)
Met patient today during her initial medical oncology consult with Dr. Janese Banks.  She has surgical consult tomorrow.  Patient is to proceed with surgery and return in 3 weeks for follow-up.  Oncotype Dx to be sent.  Blood drawn for Genetic testing today.  Patient may not have insurance at the end of the month.  We discussed BCCCP since she will be eligible for BCCCP at that time.  She is to call me back and let me know about her insurance situation.

## 2019-01-03 NOTE — Progress Notes (Signed)
Hematology/Oncology Consult note [facility]  [phone] [phone]  Patient Care Team: McLean-Scocuzza, Nino Glow, MD as PCP - General (Internal Medicine)   Name of the patient: Leah [name]  [id number]  20-Sep-1965   Date of visit: 01/03/19  Diagnosis-prior history of transient polycythemia which resolved.  Now with new diagnosis of breast cancer  Chief complaint/ Reason for visit-discuss pathology results and further management  Heme/Onc history: Patient is a 54 year old postmenopausal female G2 P2 L2 who recently underwent screening bilateral mammogram which showed area of concern in her left breast.  This was followed by an ultrasound and Core biopsy.  Ultrasound showed 1 x 1.1 x 0.7 cm hypoechoic mass at the 10:30 position of the left breast.  Left axilla appeared normal.  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  Patient will be seeing Dr. Marlou Starks to discuss surgical options.  Menarche at the age of 33.  Age at first birth 5.  Last menstrual cycle was 10 years ago.  No prior abnormal mammograms.  Family history significant for breast cancer in her mother at the age of 55 and maternal grandmother in her 20s as well.  Her father was diagnosed with prostate cancer.  Interval history-currently patient feels well and denies any complaints at this time  ECOG PS- 0 Pain scale- 0   Review of systems- Review of Systems  Constitutional: Negative for chills, fever, malaise/fatigue and weight loss.  HENT: Negative for congestion, ear discharge and nosebleeds.   Eyes: Negative for blurred vision.  Respiratory: Negative for cough, hemoptysis, sputum production, shortness of breath and wheezing.   Cardiovascular: Negative for chest pain, palpitations, orthopnea and claudication.  Gastrointestinal: Negative for abdominal pain, blood in stool, constipation, diarrhea, heartburn,  melena, nausea and vomiting.  Genitourinary: Negative for dysuria, flank pain, frequency, hematuria and urgency.  Musculoskeletal: Negative for back pain, joint pain and myalgias.  Skin: Negative for rash.  Neurological: Negative for dizziness, tingling, focal weakness, seizures, weakness and headaches.  Endo/Heme/Allergies: Does not bruise/bleed easily.  Psychiatric/Behavioral: Negative for depression and suicidal ideas. The patient does not have insomnia.        Allergies  Allergen Reactions   Penicillins    Sulfa Antibiotics      Past Medical History:  Diagnosis Date   Anxiety    Asthma    Carpal tunnel syndrome    R hand    Colon polyps    Depression    Eosinophilic esophagitis    noted in [place] with GI path report 09/29/14    Esophageal stricture    s/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 Edwin Cap    Herniated disc, cervical    s/p epidural injection   Hyperlipidemia    Low back pain    Migraines      Past Surgical History:  Procedure Laterality Date   BREAST BIOPSY Left 12/25/2018   [place] BX, PATH PENDING   CESAREAN SECTION     COLONOSCOPY WITH PROPOFOL N/A 03/27/2017   Procedure: COLONOSCOPY WITH PROPOFOL;  Surgeon: Jonathon Bellows, MD;  Location: [facility] ENDOSCOPY;  Service: Gastroenterology;  Laterality: N/A;   ESOPHAGOGASTRODUODENOSCOPY     ESOPHAGOGASTRODUODENOSCOPY (EGD) WITH PROPOFOL N/A 03/27/2017   Procedure: ESOPHAGOGASTRODUODENOSCOPY (EGD) WITH PROPOFOL WITH DILATION;  Surgeon: Jonathon Bellows, MD;  Location: [facility] ENDOSCOPY;  Service: Gastroenterology;  Laterality: N/A;   THROAT SURGERY  7209   UMBILICAL HERNIA REPAIR  2006  x 2   w mesh per pt    Social History   Socioeconomic History   Marital status: Single    Spouse name: Not on file   Number of children: Not on file   Years of education: Not on file   Highest education level: Not on file  Occupational History   Not on file  Social Needs   Financial  resource strain: Not on file   Food insecurity    Worry: Not on file    Inability: Not on file   Transportation needs    Medical: Not on file    Non-medical: Not on file  Tobacco Use   Smoking status: Former Smoker    Packs/day: 1.00    Years: 20.00    Pack years: 20.00    Quit date: 06/02/2006    Years since quitting: 12.5   Smokeless tobacco: Never Used  Substance and Sexual Activity   Alcohol use: Yes    Alcohol/week: 2.0 - 3.0 standard drinks    Types: 2 - 3 Standard drinks or equivalent per week    Comment: socially   Drug use: No   Sexual activity: Yes    Partners: Male  Lifestyle   Physical activity    Days per week: Not on file    Minutes per session: Not on file   Stress: Not on file  Relationships   Social connections    Talks on phone: Not on file    Gets together: Not on file    Attends religious service: Not on file    Active member of club or organization: Not on file    Attends meetings of clubs or organizations: Not on file    Relationship status: Not on file   Intimate partner violence    Fear of current or ex partner: Not on file    Emotionally abused: Not on file    Physically abused: Not on file    Forced sexual activity: Not on file  Other Topics Concern   Not on file  Social History Narrative   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 As of 02/26/17    Divorced.    Former smoker 20+ years quit in 1997 then started again for 5 years and quit 10-12 years ago as of 03/08/17     Family History  Problem Relation Age of Onset   Breast cancer Mother    Thyroid disease Mother    Colon polyps Mother    Prostate cancer Father    Cancer - Prostate Father    Breast cancer Maternal Grandmother      Current Outpatient Medications:    Acetaminophen (TYLENOL GO TABS EXTRA STRENGTH PO), Take by mouth every 8 (eight) hours., Disp: , Rfl:     cetirizine (ZYRTEC) 10 MG tablet, Take 1 tablet (10 mg total) by mouth daily as needed for allergies., Disp: 90 tablet, Rfl: 3   fluticasone (FLONASE) 50 MCG/ACT nasal spray, Place 2 sprays into both nostrils daily. Max 2 sprays, Disp: 16 g, Rfl: 12   gabapentin (NEURONTIN) 300 MG capsule, Take 300 mg by mouth at bedtime., Disp: , Rfl:    ibuprofen (ADVIL,MOTRIN) 800 MG tablet, Take 1 tablet (800 mg total) by mouth every 8 (eight) hours as needed (use sparingly)., Disp: 90 tablet, Rfl: 2   meclizine (ANTIVERT) 12.5 MG tablet, Take 1 tablet (  12.5 mg total) by mouth 3 (three) times daily as needed for dizziness., Disp: 90 tablet, Rfl: 1   methocarbamol (ROBAXIN) 750 MG tablet, Take 1 tablet (750 mg total) by mouth every 6 (six) hours as needed for muscle spasms., Disp: 120 tablet, Rfl: 5   montelukast (SINGULAIR) 10 MG tablet, Take 1 tablet (10 mg total) by mouth at bedtime., Disp: 90 tablet, Rfl: 3   omeprazole (PRILOSEC) 40 MG capsule, Take 1 capsule (40 mg total) by mouth daily. 30 minutes before food, Disp: 90 capsule, Rfl: 3   tiZANidine (ZANAFLEX) 4 MG tablet, Take 2-4 mg by mouth 2 (two) times daily as needed for muscle spasms., Disp: , Rfl:    traMADol (ULTRAM) 50 MG tablet, Take 1 tablet (50 mg total) by mouth every 12 (twelve) hours as needed., Disp: 10 tablet, Rfl: 0   traZODone (DESYREL) 50 MG tablet, Take 1-1.5 tablets (50-75 mg total) by mouth at bedtime as needed for sleep., Disp: 135 tablet, Rfl: 4   valACYclovir (VALTREX) 1000 MG tablet, Take 1 tablet (1,000 mg total) by mouth daily. X 5 days with outbreak as needed, Disp: 30 tablet, Rfl: 11  Physical exam:  Vitals:   01/03/19 0859  BP: 127/80  Pulse: 68  Resp: 18  Temp: 98.3 F (36.8 C)  TempSrc: Tympanic  SpO2: 100%  Weight: 170 lb (77.1 kg)   Physical Exam Constitutional:      General: She is not in acute distress. HENT:     Head: Normocephalic and atraumatic.  Eyes:     Pupils: Pupils are equal, round, and  reactive to light.  Neck:     Musculoskeletal: Normal range of motion.  Cardiovascular:     Rate and Rhythm: Normal rate and regular rhythm.     Heart sounds: Normal heart sounds.  Pulmonary:     Effort: Pulmonary effort is normal.     Breath sounds: Normal breath sounds.  Abdominal:     General: Bowel sounds are normal.     Palpations: Abdomen is soft.  Skin:    General: Skin is warm and dry.  Neurological:     Mental Status: She is alert and oriented to person, place, and time.   Breast exam performed in sitting and lying down position.  No palpable bilateral axillary adenopathy.  I do not feel any palpable mass in either breast.  There is some bruising and induration noted at the site of recent breast biopsy.   CMP Latest Ref Rng & Units 10/04/2018  Glucose 70 - 99 mg/dL 99  BUN 6 - 23 mg/dL 17  Creatinine 0.40 - 1.20 mg/dL 0.67  Sodium 135 - 145 mEq/L 138  Potassium 3.5 - 5.1 mEq/L 4.0  Chloride 96 - 112 mEq/L 103  CO2 19 - 32 mEq/L 25  Calcium 8.4 - 10.5 mg/dL 9.7  Total Protein 6.0 - 8.3 g/dL 6.8  Total Bilirubin 0.2 - 1.2 mg/dL 0.5  Alkaline Phos 39 - 117 U/L 42  AST 0 - 37 U/L 14  ALT 0 - 35 U/L 14   CBC Latest Ref Rng & Units 10/04/2018  WBC 4.0 - 10.5 K/uL 6.9  Hemoglobin 12.0 - 15.0 g/dL 16.1(H)  Hematocrit 36.0 - 46.0 % 46.0  Platelets 150.0 - 400.0 K/uL 335.0    No images are attached to the encounter.  US Breast Ltd Uni Left Inc Axilla  Result Date: 12/18/2018 CLINICAL DATA:  Left breast asymmetry/distortion seen on most recent screening mammography. EXAM: DIGITAL DIAGNOSTIC LEFT  MAMMOGRAM WITH CAD AND TOMO ULTRASOUND LEFT BREAST COMPARISON:  Previous exam(s). ACR Breast Density Category c: The breast tissue is heterogeneously dense, which may obscure small masses. FINDINGS: Additional mammographic views of the left breast demonstrate persistent subcentimeter spiculated mass in the left breast upper inner quadrant, middle depth. Mammographic images were  processed with CAD. Targeted ultrasound is performed, showing left breast 10:30 o'clock 6 cm from the nipple irregular hypoechoic mass measuring 1.0 by 1.1 by 0.7 cm. This finding corresponds to the mammographically seen focal asymmetry/distortion. There is no evidence of left axillary lymphadenopathy. IMPRESSION: Left breast 10:30 o'clock suspicious mass, for which ultrasound-guided core needle biopsy is recommended. RECOMMENDATION: Ultrasound-guided core needle biopsy of the left breast. I have discussed the findings and recommendations with the patient. If applicable, a reminder letter will be sent to the patient regarding the next appointment. BI-RADS CATEGORY  4: Suspicious. Electronically Signed   By: Fidela Salisbury M.D.   On: 12/18/2018 15:32   Mm Diag Breast Tomo Uni Left  Result Date: 12/18/2018 CLINICAL DATA:  Left breast asymmetry/distortion seen on most recent screening mammography. EXAM: DIGITAL DIAGNOSTIC LEFT MAMMOGRAM WITH CAD AND TOMO ULTRASOUND LEFT BREAST COMPARISON:  Previous exam(s). ACR Breast Density Category c: The breast tissue is heterogeneously dense, which may obscure small masses. FINDINGS: Additional mammographic views of the left breast demonstrate persistent subcentimeter spiculated mass in the left breast upper inner quadrant, middle depth. Mammographic images were processed with CAD. Targeted ultrasound is performed, showing left breast 10:30 o'clock 6 cm from the nipple irregular hypoechoic mass measuring 1.0 by 1.1 by 0.7 cm. This finding corresponds to the mammographically seen focal asymmetry/distortion. There is no evidence of left axillary lymphadenopathy. IMPRESSION: Left breast 10:30 o'clock suspicious mass, for which ultrasound-guided core needle biopsy is recommended. RECOMMENDATION: Ultrasound-guided core needle biopsy of the left breast. I have discussed the findings and recommendations with the patient. If applicable, a reminder letter will be sent to the  patient regarding the next appointment. BI-RADS CATEGORY  4: Suspicious. Electronically Signed   By: Fidela Salisbury M.D.   On: 12/18/2018 15:32   Mm Clip Placement Left  Result Date: 12/25/2018 CLINICAL DATA:  Evaluate COIL clip placement following ultrasound-guided LEFT breast biopsy. EXAM: DIAGNOSTIC LEFT MAMMOGRAM POST ULTRASOUND BIOPSY COMPARISON:  Previous exam(s). FINDINGS: Mammographic images were obtained following ultrasound guided biopsy of the 1.1 cm mass at the 10:30 position of the LEFT breast. The COIL biopsy marking clip is in expected position at the site of biopsy and corresponds to the mass/distortion identified mammographically. IMPRESSION: Appropriate positioning of the COIL shaped biopsy marking clip at the site of biopsy in the UPPER INNER LEFT breast. Final Assessment: Post Procedure Mammograms for Marker Placement Electronically Signed   By: Margarette Canada M.D.   On: 12/25/2018 10:39   [place] Lt Breast Bx W Loc Dev 1st Lesion Img Bx Spec US Guide  Addendum Date: 12/28/2018   ADDENDUM REPORT: 12/28/2018 11:58 ADDENDUM: PATHOLOGY revealed: A. LEFT BREAST,- INVASIVE MAMMARY CARCINOMA, NO SPECIAL TYPE. 5 mm in this sample. Grade 2. Ductal carcinoma in situ: Present, solid, intermediate grade. Lymphovascular invasion: Not identified. Comment: The definitive grade will be assigned on the excisional specimen. Pathology results are CONCORDANT with imaging findings, per Dr. Hassan Rowan. Pathology results were discussed with patient via telephone. The patient reported doing well after the biopsy with no adverse symptoms, only tenderness at the site. Post biopsy care instructions were reviewed and questions were answered. The patient was encouraged to call [facility] for any additional questions or concerns. Recommendation: Surgical referral. Request for surgical referral was relayed to nurse navigators at [facility] by Electa Sniff RN on 12/27/2018. Addendum by Electa Sniff RN on 12/28/2018. Electronically Signed   By: Margarette Canada M.D.   On: 12/28/2018 11:58   Result Date: 12/28/2018 CLINICAL DATA:  53 year old female for tissue sampling of UPPER INNER LEFT breast mass. EXAM: ULTRASOUND GUIDED LEFT BREAST CORE NEEDLE BIOPSY COMPARISON:  Previous exam(s). FINDINGS: I met with the patient and we discussed the procedure of ultrasound-guided biopsy, including benefits and alternatives. We discussed the high likelihood of a successful procedure. We discussed the risks of the procedure, including infection, bleeding, tissue injury, clip migration, and inadequate sampling. Informed written consent was given. The usual time-out protocol was performed immediately prior to the procedure. Lesion quadrant: UPPER INNER LEFT breast Using sterile technique and 1% Lidocaine as local anesthetic, under direct ultrasound visualization, a 12 gauge spring-loaded device was used to perform biopsy of the 1.1 cm mass at the 10:30 position of the LEFT breast using a MEDIAL approach. At the conclusion of the procedure COIL tissue marker clip was deployed into the biopsy cavity. Follow up 2 view mammogram was performed and dictated separately. IMPRESSION: Ultrasound guided biopsy of 1.1 cm UPPER INNER LEFT breast mass. No apparent complications. Electronically Signed: By: Margarette Canada M.D. On: 12/25/2018 10:38     Assessment and plan- Patient is a 53 y.o. female with prior history of polycythemia which subsequently resolved now with new diagnosis of breast cancer stage Ia invasive mammary carcinoma cT1 ccN0 cM0 ER/PR positive and HER-2/neu negative on core biopsy  I discussed the results of the mammogram and core biopsy with the patient in detail.  She was found to have a small 1.1 cm which was consistent with invasive mammary carcinoma grade 2 ER/PR positive and HER-2/neu negative.  She has stage I breast cancer and needs upfront lumpectomy and sentinel lymph node biopsy.  She will be seeing Dr.  Marlou Starks to discuss this further.  She will also need adjuvant radiation treatment after surgery.  Given that her tumor is ER/PR positive there would be role for hormone therapy for 5 years upon completion of radiation treatment which I will discuss in greater detail after her surgery.   I will see her after surgery to discuss final pathology and further management.  If there is no evidence of lymph node involvement at the time of surgery I would recommend getting Oncotype testing to determine if she would benefit from adjuvant chemotherapy.  Discussed what Oncotype testing is and how the results are interpreted.  If her score is 11 or less she would be in the low risk group and would not benefit from adjuvant chemotherapy.  Also given that she is 8 and postmenopausal if her score is between 11-25 she would be in the intermediate risk group and would also not benefit from adjuvant chemotherapy.  Adjuvant chemotherapy would be recommended if her score is 26 or higher.  I will plan to send of Oncotype testing after her final pathology is back.  Treatment will be given with a curative intent  Given her family history of breast cancer in her mother and maternal grandmother as well as prostate cancer in her father she would qualify for genetic testing and we will send that off today.  Discussed that bilateral prophylactic mastectomy would be recommended only if she were to have BRCA1 or BRCA2 mutations.  Patient  verbalized understanding of the plan  Cancer Staging Breast cancer [facility]) Staging form: Breast, AJCC 8th Edition - Clinical stage from 01/03/2019: Stage IA (cT1c, cN0, cM0, G2, ER+, PR+, HER2-) - Signed by Sindy Guadeloupe, MD on 01/03/2019   Total face to face encounter time for this patient visit was 40 min. >50% of the time was  spent in counseling and coordination of care.     Visit Diagnosis 1. Goals of care, counseling/discussion   2. Malignant neoplasm of upper-inner quadrant of left breast  in female, estrogen receptor positive ([place])      Dr. Randa Evens, MD, MPH [facility] [phone] 01/03/2019 1:19 PM

## 2019-01-03 NOTE — Progress Notes (Signed)
Pt in for follow up, reports nurse called yesterday for pre assessment.

## 2019-01-04 ENCOUNTER — Ambulatory Visit: Payer: Self-pay | Admitting: General Surgery

## 2019-01-04 DIAGNOSIS — C50212 Malignant neoplasm of upper-inner quadrant of left female breast: Secondary | ICD-10-CM | POA: Diagnosis not present

## 2019-01-04 DIAGNOSIS — Z170 Estrogen receptor positive status [ER+]: Secondary | ICD-10-CM | POA: Diagnosis not present

## 2019-01-07 ENCOUNTER — Inpatient Hospital Stay: Payer: Medicaid Other

## 2019-01-08 ENCOUNTER — Other Ambulatory Visit: Payer: Self-pay | Admitting: General Surgery

## 2019-01-08 DIAGNOSIS — C50212 Malignant neoplasm of upper-inner quadrant of left female breast: Secondary | ICD-10-CM

## 2019-01-08 DIAGNOSIS — Z170 Estrogen receptor positive status [ER+]: Secondary | ICD-10-CM

## 2019-01-10 ENCOUNTER — Other Ambulatory Visit: Payer: Self-pay

## 2019-01-10 ENCOUNTER — Encounter
Admission: RE | Admit: 2019-01-10 | Discharge: 2019-01-10 | Disposition: A | Discharge status: Home / Self Care | Payer: Medicaid Other | Source: Ambulatory Visit | Attending: General Surgery | Admitting: General Surgery

## 2019-01-10 DIAGNOSIS — Z01812 Encounter for preprocedural laboratory examination: Secondary | ICD-10-CM | POA: Insufficient documentation

## 2019-01-10 HISTORY — DX: Personal history of urinary calculi: Z87.442

## 2019-01-10 NOTE — Patient Instructions (Signed)
Your procedure is scheduled on: Wed. 11/4 Report to Day Surgery. To find out your arrival time please call [phone] between 1PM - 3PM on Tues 11/3.  Remember: Instructions that are not followed completely may result in serious medical risk,  up to and including death, or upon the discretion of your surgeon and anesthesiologist your  surgery may need to be rescheduled.     _X__ 1. Do not eat food after midnight the night before your procedure.                 No gum chewing or hard candies. You may drink clear liquids up to 2 hours                 before you are scheduled to arrive for your surgery- DO not drink clear                 liquids within 2 hours of the start of your surgery.                 Clear Liquids include:  water, apple juice without pulp, clear carbohydrate                 drink such as Clearfast of Gatorade, Black Coffee or Tea (Do not add                 anything to coffee or tea).  __X__2.  On the morning of surgery brush your teeth with toothpaste and water, you                may rinse your mouth with mouthwash if you wish.  Do not swallow any toothpaste of mouthwash.     _X__ 3.  No Alcohol for 24 hours before or after surgery.   ___ 4.  Do Not Smoke or use e-cigarettes For 24 Hours Prior to Your Surgery.                 Do not use any chewable tobacco products for at least 6 hours prior to                 surgery.  ____  5.  Bring all medications with you on the day of surgery if instructed.   x____  6.  Notify your doctor if there is any change in your medical condition      (cold, fever, infections).     Do not wear jewelry, make-up, hairpins, clips or nail polish. Do not wear lotions, powders, or perfumes. You may wear deodorant. Do not shave 48 hours prior to surgery. Men may shave face and neck. Do not bring valuables to the hospital.    [facility] is not responsible for any belongings or valuables.  Contacts, dentures  or bridgework may not be worn into surgery. Leave your suitcase in the car. After surgery it may be brought to your room. For patients admitted to the hospital, discharge time is determined by your treatment team.   Patients discharged the day of surgery will not be allowed to drive home.   Please read over the following fact sheets that you were given:     __x__ Take these medicines the morning of surgery with A SIP OF WATER:    1. methocarbamol (ROBAXIN) 750 MG tablet if needed  2. omeprazole (PRILOSEC) 20 MG capsule  3.traMADol (ULTRAM) 50 MG tablet if needed   4.  5.  6.  ____ Fleet Enema (as  directed)   __x__ Use CHG Soap as directed  ____ Use inhalers on the day of surgery  ____ Stop metformin 2 days prior to surgery    ____ Take 1/2 of usual insulin dose the night before surgery. No insulin the morning          of surgery.   ____ Stop Coumadin/Plavix/aspirin  __x__ Stop Anti-inflammatories  meloxicam (MOBIC) 15 MG tablet, ibuprofen aleve or aspirin     May take tylenol or tramadol   ____ Stop supplements until after surgery.    ____ Bring C-Pap to the hospital.

## 2019-01-11 ENCOUNTER — Other Ambulatory Visit
Admission: RE | Admit: 2019-01-11 | Discharge: 2019-01-11 | Disposition: A | Discharge status: Home / Self Care | Payer: Medicaid Other | Source: Ambulatory Visit | Attending: General Surgery | Admitting: General Surgery

## 2019-01-11 ENCOUNTER — Encounter: Payer: Self-pay | Admitting: *Deleted

## 2019-01-11 DIAGNOSIS — Z01812 Encounter for preprocedural laboratory examination: Secondary | ICD-10-CM | POA: Diagnosis not present

## 2019-01-11 DIAGNOSIS — Z20828 Contact with and (suspected) exposure to other viral communicable diseases: Secondary | ICD-10-CM | POA: Diagnosis not present

## 2019-01-11 LAB — SARS CORONAVIRUS 2 (TAT 6-24 HRS): SARS Coronavirus 2: NEGATIVE

## 2019-01-11 NOTE — Progress Notes (Signed)
Met patient today to discuss [facility] Medicaid options.  Patient was diagnosed with breast cancer outside of [facility], but is loosing her insurance at the end of this month.  She does meet [facility] eligibility, Therefore, she has completed the [employer] application.  Scanned application to Donald Prose at [facility].  Patient's surgery is scheduled for next week.  Will notify her of Medicaid approval when available.

## 2019-01-16 ENCOUNTER — Ambulatory Visit: Payer: Medicaid Other

## 2019-01-16 ENCOUNTER — Other Ambulatory Visit: Payer: Self-pay

## 2019-01-16 ENCOUNTER — Ambulatory Visit
Admission: RE | Admit: 2019-01-16 | Discharge: 2019-01-16 | Disposition: A | Discharge status: Home / Self Care | Payer: Medicaid Other | Source: Ambulatory Visit | Attending: General Surgery | Admitting: General Surgery

## 2019-01-16 ENCOUNTER — Encounter
Admission: RE | Disposition: A | Discharge status: Home / Self Care | Payer: Self-pay | Source: Home / Self Care | Attending: General Surgery

## 2019-01-16 ENCOUNTER — Encounter
Admission: RE | Admit: 2019-01-16 | Discharge: 2019-01-16 | Disposition: A | Discharge status: Home / Self Care | Payer: Medicaid Other | Source: Ambulatory Visit | Attending: General Surgery | Admitting: General Surgery

## 2019-01-16 ENCOUNTER — Ambulatory Visit: Payer: Medicaid Other | Admitting: Anesthesiology

## 2019-01-16 ENCOUNTER — Encounter: Payer: Self-pay | Admitting: *Deleted

## 2019-01-16 ENCOUNTER — Ambulatory Visit
Admission: RE | Admit: 2019-01-16 | Discharge: 2019-01-16 | Disposition: A | Discharge status: Home / Self Care | Payer: Medicaid Other | Attending: General Surgery | Admitting: General Surgery

## 2019-01-16 DIAGNOSIS — F329 Major depressive disorder, single episode, unspecified: Secondary | ICD-10-CM | POA: Diagnosis not present

## 2019-01-16 DIAGNOSIS — C50912 Malignant neoplasm of unspecified site of left female breast: Secondary | ICD-10-CM | POA: Diagnosis not present

## 2019-01-16 DIAGNOSIS — Z87891 Personal history of nicotine dependence: Secondary | ICD-10-CM | POA: Insufficient documentation

## 2019-01-16 DIAGNOSIS — Z803 Family history of malignant neoplasm of breast: Secondary | ICD-10-CM | POA: Insufficient documentation

## 2019-01-16 DIAGNOSIS — C773 Secondary and unspecified malignant neoplasm of axilla and upper limb lymph nodes: Secondary | ICD-10-CM | POA: Diagnosis not present

## 2019-01-16 DIAGNOSIS — Z170 Estrogen receptor positive status [ER+]: Secondary | ICD-10-CM

## 2019-01-16 DIAGNOSIS — C50212 Malignant neoplasm of upper-inner quadrant of left female breast: Secondary | ICD-10-CM

## 2019-01-16 DIAGNOSIS — Z79899 Other long term (current) drug therapy: Secondary | ICD-10-CM | POA: Diagnosis not present

## 2019-01-16 DIAGNOSIS — J45909 Unspecified asthma, uncomplicated: Secondary | ICD-10-CM | POA: Diagnosis not present

## 2019-01-16 HISTORY — PX: BREAST LUMPECTOMY WITH NEEDLE LOCALIZATION AND AXILLARY SENTINEL LYMPH NODE BX: SHX5760

## 2019-01-16 SURGERY — BREAST LUMPECTOMY WITH NEEDLE LOCALIZATION AND AXILLARY SENTINEL LYMPH NODE BX
Anesthesia: General | Site: Breast | Laterality: Left

## 2019-01-16 MED ORDER — FENTANYL CITRATE (PF) 100 MCG/2ML IJ SOLN
INTRAMUSCULAR | Status: AC
  Filled 2019-01-16: qty 2

## 2019-01-16 MED ORDER — MIDAZOLAM HCL 2 MG/2ML IJ SOLN
INTRAMUSCULAR | Status: AC
  Administered 2019-01-16: 14:00:00 1 mg via INTRAVENOUS
  Filled 2019-01-16: qty 2

## 2019-01-16 MED ORDER — BUPIVACAINE LIPOSOME 1.3 % IJ SUSP
INTRAMUSCULAR | Status: AC
  Filled 2019-01-16: qty 20

## 2019-01-16 MED ORDER — SUGAMMADEX SODIUM 200 MG/2ML IV SOLN
INTRAVENOUS | Status: DC | PRN
  Administered 2019-01-16: 200 mg via INTRAVENOUS

## 2019-01-16 MED ORDER — GABAPENTIN 300 MG PO CAPS
ORAL_CAPSULE | ORAL | Status: AC
  Filled 2019-01-16: qty 1

## 2019-01-16 MED ORDER — MIDAZOLAM HCL 2 MG/2ML IJ SOLN
INTRAMUSCULAR | Status: AC
  Filled 2019-01-16: qty 2

## 2019-01-16 MED ORDER — GLYCOPYRROLATE 0.2 MG/ML IJ SOLN
INTRAMUSCULAR | Status: DC | PRN
  Administered 2019-01-16: 0.2 mg via INTRAVENOUS

## 2019-01-16 MED ORDER — ONDANSETRON HCL 4 MG/2ML IJ SOLN
INTRAMUSCULAR | Status: DC | PRN
  Administered 2019-01-16: 4 mg via INTRAVENOUS

## 2019-01-16 MED ORDER — MIDAZOLAM HCL 2 MG/2ML IJ SOLN
INTRAMUSCULAR | Status: DC | PRN
  Administered 2019-01-16: 2 mg via INTRAVENOUS

## 2019-01-16 MED ORDER — FENTANYL CITRATE (PF) 100 MCG/2ML IJ SOLN: 25.0000 ug | INTRAMUSCULAR | Status: DC | PRN

## 2019-01-16 MED ORDER — LIDOCAINE HCL (PF) 1 % IJ SOLN
INTRAMUSCULAR | Status: AC
  Filled 2019-01-16: qty 5

## 2019-01-16 MED ORDER — EPINEPHRINE PF 1 MG/ML IJ SOLN
INTRAMUSCULAR | Status: AC
  Filled 2019-01-16: qty 1

## 2019-01-16 MED ORDER — CHLORHEXIDINE GLUCONATE CLOTH 2 % EX PADS: 6.0000 | MEDICATED_PAD | Freq: Once | CUTANEOUS | Status: DC

## 2019-01-16 MED ORDER — BUPIVACAINE HCL (PF) 0.5 % IJ SOLN
INTRAMUSCULAR | Status: AC
  Filled 2019-01-16: qty 10

## 2019-01-16 MED ORDER — TECHNETIUM TC 99M SULFUR COLLOID FILTERED
0.9460 | Freq: Once | INTRAVENOUS | Status: AC | PRN
  Administered 2019-01-16: 0.946 via INTRADERMAL

## 2019-01-16 MED ORDER — HYDROCODONE-ACETAMINOPHEN 5-325 MG PO TABS: 1.0000 | ORAL_TABLET | Freq: Four times a day (QID) | ORAL | 0 refills | Status: DC | PRN

## 2019-01-16 MED ORDER — VANCOMYCIN HCL IN DEXTROSE 1-5 GM/200ML-% IV SOLN
1000.0000 mg | INTRAVENOUS | Status: AC
  Administered 2019-01-16 (×2): 1000 mg via INTRAVENOUS

## 2019-01-16 MED ORDER — ROCURONIUM BROMIDE 100 MG/10ML IV SOLN
INTRAVENOUS | Status: DC | PRN
  Administered 2019-01-16: 50 mg via INTRAVENOUS
  Administered 2019-01-16: 10 mg via INTRAVENOUS

## 2019-01-16 MED ORDER — SODIUM CHLORIDE 0.9 % IV SOLN
INTRAVENOUS | Status: DC | PRN
  Administered 2019-01-16: 25 ug/min via INTRAVENOUS

## 2019-01-16 MED ORDER — LIDOCAINE HCL (CARDIAC) PF 100 MG/5ML IV SOSY
PREFILLED_SYRINGE | INTRAVENOUS | Status: DC | PRN
  Administered 2019-01-16: 100 mg via INTRAVENOUS

## 2019-01-16 MED ORDER — LACTATED RINGERS IV SOLN
INTRAVENOUS | Status: DC
  Administered 2019-01-16 (×3): via INTRAVENOUS

## 2019-01-16 MED ORDER — MIDAZOLAM HCL 2 MG/2ML IJ SOLN
1.0000 mg | Freq: Once | INTRAMUSCULAR | Status: AC
  Administered 2019-01-16: 14:00:00 1 mg via INTRAVENOUS

## 2019-01-16 MED ORDER — ACETAMINOPHEN 500 MG PO TABS
ORAL_TABLET | ORAL | Status: AC
  Filled 2019-01-16: qty 2

## 2019-01-16 MED ORDER — FENTANYL CITRATE (PF) 100 MCG/2ML IJ SOLN
INTRAMUSCULAR | Status: DC | PRN
  Administered 2019-01-16: 50 ug via INTRAVENOUS

## 2019-01-16 MED ORDER — PROPOFOL 10 MG/ML IV BOLUS
INTRAVENOUS | Status: DC | PRN
  Administered 2019-01-16: 150 mg via INTRAVENOUS

## 2019-01-16 MED ORDER — TRAMADOL HCL 50 MG PO TABS: 50.0000 mg | ORAL_TABLET | Freq: Four times a day (QID) | ORAL | 0 refills | Status: DC | PRN

## 2019-01-16 MED ORDER — EPHEDRINE SULFATE 50 MG/ML IJ SOLN
INTRAMUSCULAR | Status: DC | PRN
  Administered 2019-01-16 (×3): 5 mg via INTRAVENOUS

## 2019-01-16 MED ORDER — DEXAMETHASONE SODIUM PHOSPHATE 10 MG/ML IJ SOLN
INTRAMUSCULAR | Status: DC | PRN
  Administered 2019-01-16: 10 mg via INTRAVENOUS

## 2019-01-16 MED ORDER — GABAPENTIN 300 MG PO CAPS
300.0000 mg | ORAL_CAPSULE | ORAL | Status: AC
  Administered 2019-01-16: 12:00:00 300 mg via ORAL

## 2019-01-16 MED ORDER — BUPIVACAINE HCL (PF) 0.5 % IJ SOLN
INTRAMUSCULAR | Status: AC
  Filled 2019-01-16: qty 30

## 2019-01-16 MED ORDER — PROPOFOL 10 MG/ML IV BOLUS
INTRAVENOUS | Status: AC
  Filled 2019-01-16: qty 20

## 2019-01-16 MED ORDER — BUPIVACAINE-EPINEPHRINE (PF) 0.5% -1:200000 IJ SOLN
INTRAMUSCULAR | Status: DC | PRN
  Administered 2019-01-16: 25 mL via PERINEURAL

## 2019-01-16 MED ORDER — FENTANYL CITRATE (PF) 100 MCG/2ML IJ SOLN
50.0000 ug | Freq: Once | INTRAMUSCULAR | Status: AC
  Administered 2019-01-16: 14:00:00 50 ug via INTRAVENOUS

## 2019-01-16 MED ORDER — ACETAMINOPHEN 500 MG PO TABS
1000.0000 mg | ORAL_TABLET | ORAL | Status: AC
  Administered 2019-01-16: 1000 mg via ORAL

## 2019-01-16 MED ORDER — PROMETHAZINE HCL 25 MG/ML IJ SOLN: 6.2500 mg | INTRAMUSCULAR | Status: DC | PRN

## 2019-01-16 MED ORDER — VANCOMYCIN HCL IN DEXTROSE 1-5 GM/200ML-% IV SOLN
INTRAVENOUS | Status: AC
  Administered 2019-01-16: 1000 mg via INTRAVENOUS
  Filled 2019-01-16: qty 200

## 2019-01-16 MED ORDER — PHENYLEPHRINE HCL (PRESSORS) 10 MG/ML IV SOLN
INTRAVENOUS | Status: DC | PRN
  Administered 2019-01-16 (×4): 100 ug via INTRAVENOUS

## 2019-01-16 MED ORDER — FENTANYL CITRATE (PF) 100 MCG/2ML IJ SOLN
INTRAMUSCULAR | Status: AC
  Administered 2019-01-16: 14:00:00 50 ug via INTRAVENOUS
  Filled 2019-01-16: qty 2

## 2019-01-16 SURGICAL SUPPLY — 38 items
APPLIER CLIP 11 MED OPEN (CLIP) ×2
BINDER BREAST LRG (GAUZE/BANDAGES/DRESSINGS) ×2 IMPLANT
BINDER BREAST MEDIUM (GAUZE/BANDAGES/DRESSINGS) ×1 IMPLANT
BINDER BREAST XLRG (GAUZE/BANDAGES/DRESSINGS) ×1 IMPLANT
CLIP APPLIE 11 MED OPEN (CLIP) IMPLANT
CNTNR SPEC 2.5X3XGRAD LEK (MISCELLANEOUS) ×3
CONT SPEC 4OZ STER OR WHT (MISCELLANEOUS) ×3
CONTAINER SPEC 2.5X3XGRAD LEK (MISCELLANEOUS) IMPLANT
COVER WAND RF STERILE (DRAPES) ×2 IMPLANT
DERMABOND ADVANCED (GAUZE/BANDAGES/DRESSINGS) ×1
DERMABOND ADVANCED .7 DNX12 (GAUZE/BANDAGES/DRESSINGS) ×1 IMPLANT
DEVICE DUBIN SPECIMEN MAMMOGRA (MISCELLANEOUS) ×2 IMPLANT
DRAPE CHEST BREAST 77X106 FENE (MISCELLANEOUS) ×2 IMPLANT
DRAPE UTILITY 15X26 TOWEL STRL (DRAPES) ×4 IMPLANT
ELECT CAUTERY BLADE 6.4 (BLADE) ×2 IMPLANT
ELECT CAUTERY BLADE TIP 2.5 (TIP) ×2
ELECT REM PT RETURN 9FT ADLT (ELECTROSURGICAL) ×2
ELECTRODE CAUTERY BLDE TIP 2.5 (TIP) ×1 IMPLANT
ELECTRODE REM PT RTRN 9FT ADLT (ELECTROSURGICAL) ×1 IMPLANT
GLOVE BIO SURGEON STRL SZ7.5 (GLOVE) ×4 IMPLANT
GOWN STRL REUS W/ TWL LRG LVL3 (GOWN DISPOSABLE) ×2 IMPLANT
GOWN STRL REUS W/TWL LRG LVL3 (GOWN DISPOSABLE) ×2
KIT MARKER MARGIN INK (KITS) ×2 IMPLANT
NEEDLE HYPO 22GX1.5 SAFETY (NEEDLE) ×2 IMPLANT
NS IRRIG 1000ML POUR BTL (IV SOLUTION) ×2 IMPLANT
PACK BASIN MINOR ARMC (MISCELLANEOUS) ×2 IMPLANT
SPONGE LAP 18X18 RF (DISPOSABLE) ×2 IMPLANT
SUT MNCRL 4-0 (SUTURE) ×2
SUT MNCRL 4-0 27XMFL (SUTURE) ×2
SUT SILK 3 0 (SUTURE) ×1
SUT SILK 3-0 18XBRD TIE 12 (SUTURE) ×1 IMPLANT
SUT VIC AB 3-0 SH 8-18 (SUTURE) ×2 IMPLANT
SUTURE MNCRL 4-0 27XMF (SUTURE) ×1 IMPLANT
SYR 10ML LL (SYRINGE) ×2 IMPLANT
SYR BULB IRRIG 60ML STRL (SYRINGE) ×2 IMPLANT
SYR TB 1ML 27GX1/2 LL (SYRINGE) ×1 IMPLANT
TOWEL OR 17X26 4PK STRL BLUE (TOWEL DISPOSABLE) ×2 IMPLANT
WATER STERILE IRR 1000ML POUR (IV SOLUTION) ×2 IMPLANT

## 2019-01-16 NOTE — Transfer of Care (Signed)
Immediate Anesthesia Transfer of Care Note  Patient: Leah [name]  Procedure(s) Performed: LEFT BREAST LUMPECTOMY WITH NEEDLE LOCALIZATION AND SENTINEL LYMPH NODE BIOPSY (Left Breast)  Patient Location: PACU  Anesthesia Type:General  Level of Consciousness: sedated  Airway & Oxygen Therapy: Patient Spontanous Breathing and Patient connected to face mask oxygen  Post-op Assessment: Report given to RN and Post -op Vital signs reviewed and stable  Post vital signs: Reviewed and stable  Last Vitals:  Vitals Value Taken Time  BP 113/66 01/16/19 1616  Temp 36.7 C 01/16/19 1616  Pulse 82 01/16/19 1616  Resp 20 01/16/19 1616  SpO2 99 % 01/16/19 1616  Vitals shown include unvalidated device data.  Last Pain:  Vitals:   01/16/19 1616  TempSrc: Temporal  PainSc: 0-No pain      Patients Stated Pain Goal: 0 ([id number])  Complications: No apparent anesthesia complications

## 2019-01-16 NOTE — Anesthesia Preprocedure Evaluation (Signed)
Anesthesia Evaluation  Patient identified by MRN, date of birth, ID band Patient awake    Reviewed: Allergy & Precautions, NPO status , Patient's Chart, lab work & pertinent test results  History of Anesthesia Complications Negative for: history of anesthetic complications  Airway Mallampati: II       Dental  (+) Dental Advidsory Given, Missing, Teeth Intact   Pulmonary neg shortness of breath, asthma (as a child, no problems in years) , neg recent URI, former smoker,           Cardiovascular Exercise Tolerance: Good (-) hypertension(-) angina(-) Past MI and (-) CHF (-) dysrhythmias (-) Valvular Problems/Murmurs     Neuro/Psych  Headaches, neg Seizures PSYCHIATRIC DISORDERS Anxiety Depression    GI/Hepatic Neg liver ROS, neg GERD  ,  Endo/Other  neg diabetes  Renal/GU negative Renal ROS     Musculoskeletal   Abdominal   Peds  Hematology   Anesthesia Other Findings Past Medical History: No date: Anxiety No date: Asthma     Comment:  as a child No date: Carpal tunnel syndrome     Comment:  R hand  No date: Colon polyps No date: Depression No date: Eosinophilic esophagitis     Comment:  noted in [place] with GI path report 09/29/14  No date: Esophageal stricture     Comment:  s/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               Edwin Cap  No date: Herniated disc, cervical     Comment:  s/p epidural injection No date: History of kidney stones No date: Hyperlipidemia No date: Low back pain No date: Migraines   Reproductive/Obstetrics                             Anesthesia Physical  Anesthesia Plan  ASA: II  Anesthesia Plan: General   Post-op Pain Management:  Regional for Post-op pain   Induction: Intravenous  PONV Risk Score and Plan: 3 and Ondansetron, Dexamethasone, Midazolam and Treatment may vary due to age or medical condition  Airway Management  Planned: LMA  Additional Equipment:   Intra-op Plan:   Post-operative Plan: Extubation in OR  Informed Consent: I have reviewed the patients History and Physical, chart, labs and discussed the procedure including the risks, benefits and alternatives for the proposed anesthesia with the patient or authorized representative who has indicated his/her understanding and acceptance.       Plan Discussed with:   Anesthesia Plan Comments:         Anesthesia Quick Evaluation

## 2019-01-16 NOTE — Anesthesia Post-op Follow-up Note (Signed)
Anesthesia QCDR form completed.        

## 2019-01-16 NOTE — Interval H&P Note (Signed)
History and Physical Interval Note:  01/16/2019 2:02 PM  Leah [name]  has presented today for surgery, with the diagnosis of LEFT BREAST CANCER.  The various methods of treatment have been discussed with the patient and family. After consideration of risks, benefits and other options for treatment, the patient has consented to  Procedure(s): LEFT BREAST LUMPECTOMY WITH NEEDLE LOCALIZATION AND SENTINEL LYMPH NODE BIOPSY (Left) as a surgical intervention.  The patient's history has been reviewed, patient examined, no change in status, stable for surgery.  I have reviewed the patient's chart and labs.  Questions were answered to the patient's satisfaction.     Autumn Messing III

## 2019-01-16 NOTE — H&P (Signed)
Leah [name]  Location: [place] [facility] Patient #: [id number] DOB: 11-18-1965 Single / Language: Cleophus Molt / Race: White Female   History of Present Illness  The patient is a 53 year old female who presents with breast cancer. we are asked to see the patient in consultation by Dr. Karlyn Agee to evaluate her for a new left breast cancer. The patient is a 53 year old white female who recently went for a routine screening mammogram. At that time she was found to have a 1 cm area of distortion in the upper inner left breast with normal-looking lymph nodes. The mass was biopsied and came back as an invasive breast cancer that was ER and PR positive and HER-2 negative. She is otherwise in pretty good shape medically except for some bulging disks in her neck. She quit smoking about 20 years ago. She does have a significant family history of breast cancer in her mother and grandmother. She also had a genetic evaluation recently at the cancer center.   Past Surgical History Breast Biopsy  Left. Cesarean Section - 1   Diagnostic Studies History  Colonoscopy  1-5 years ago Mammogram  within last year Pap Smear  1-5 years ago  Allergies  Penicillin G Procaine *PENICILLINS*  Sulfa 10 *OPHTHALMIC AGENTS*   Medication History  Fluticasone Propionate (50MCG/ACT Suspension, Nasal) Active. Gabapentin (300MG Capsule, Oral) Active. Ibuprofen (800MG Tablet, Oral) Active. Meclizine HCl (12.5MG Tablet, Oral) Active. Methocarbamol (750MG Tablet, Oral) Active. Montelukast Sodium (10MG Tablet, Oral) Active. Omeprazole (40MG Capsule DR, Oral) Active. tiZANidine HCl (4MG Tablet, Oral) Active. traMADol HCl (50MG Tablet, Oral) Active. traZODone HCl (50MG Tablet, Oral) Active. valACYclovir HCl (1GM Tablet, Oral) Active. Tylenol Extra Strength (500MG Tablet, Oral) Active. ZyrTEC Allergy (10MG Tablet, Oral) Active. Medications Reconciled  Social History  Alcohol use   Occasional alcohol use. Caffeine use  Coffee, Tea. No drug use  Tobacco use  Former smoker.  Family History  Breast Cancer  Mother. Colon Polyps  Father, Mother. Prostate Cancer  Father. Thyroid problems  Mother.  Pregnancy / Birth History  Age at menarche  22 years. Age of menopause  <47 Gravida  5 Irregular periods  Maternal age  64-30 Para  2  Other Problems  Anxiety Disorder  Asthma  Back Pain  Breast Cancer  Depression  Kidney Stone  Lump In Breast  Migraine Headache  Umbilical Hernia Repair     Review of Systems  General Not Present- Appetite Loss, Chills, Fatigue, Fever, Night Sweats, Weight Gain and Weight Loss. Skin Not Present- Change in Wart/Mole, Dryness, Hives, Jaundice, New Lesions, Non-Healing Wounds, Rash and Ulcer. HEENT Present- Seasonal Allergies and Wears glasses/contact lenses. Not Present- Earache, Hearing Loss, Hoarseness, Nose Bleed, Oral Ulcers, Ringing in the Ears, Sinus Pain, Sore Throat, Visual Disturbances and Yellow Eyes. Respiratory Not Present- Bloody sputum, Chronic Cough, Difficulty Breathing, Snoring and Wheezing. Breast Present- Breast Mass. Not Present- Breast Pain, Nipple Discharge and Skin Changes. Cardiovascular Not Present- Chest Pain, Difficulty Breathing Lying Down, Leg Cramps, Palpitations, Rapid Heart Rate, Shortness of Breath and Swelling of Extremities. Gastrointestinal Not Present- Abdominal Pain, Bloating, Bloody Stool, Change in Bowel Habits, Chronic diarrhea, Constipation, Difficulty Swallowing, Excessive gas, Gets full quickly at meals, Hemorrhoids, Indigestion, Nausea, Rectal Pain and Vomiting. Female Genitourinary Not Present- Frequency, Nocturia, Painful Urination, Pelvic Pain and Urgency. Musculoskeletal Present- Back Pain and Joint Stiffness. Not Present- Joint Pain, Muscle Pain, Muscle Weakness and Swelling of Extremities. Neurological Present- Numbness and Tingling. Not Present- Decreased  Memory, Fainting, Headaches, Seizures, Tremor,  Trouble walking and Weakness. Psychiatric Present- Anxiety and Depression. Not Present- Bipolar, Change in Sleep Pattern, Fearful and Frequent crying. Endocrine Not Present- Cold Intolerance, Excessive Hunger, Hair Changes, Heat Intolerance, Hot flashes and New Diabetes. Hematology Not Present- Blood Thinners, Easy Bruising, Excessive bleeding, Gland problems, HIV and Persistent Infections.  Vitals  Weight: 169.38 lb Height: 63in Body Surface Area: 1.8 m Body Mass Index: 30 kg/m  Pulse: 81 (Regular)        Physical Exam General Mental Status-Alert. General Appearance-Consistent with stated age. Hydration-Well hydrated. Voice-Normal.  Head and Neck Head-normocephalic, atraumatic with no lesions or palpable masses. Trachea-midline. Thyroid Gland Characteristics - normal size and consistency.  Eye Eyeball - Bilateral-Extraocular movements intact. Sclera/Conjunctiva - Bilateral-No scleral icterus.  Chest and Lung Exam Chest and lung exam reveals -quiet, even and easy respiratory effort with no use of accessory muscles and on auscultation, normal breath sounds, no adventitious sounds and normal vocal resonance. Inspection Chest Wall - Normal. Back - normal.  Breast Note: there is a small palpable bruise in the upper inner left breast. Other than this there is no other palpable mass in either breast. There is no palpable axillary, supraclavicular, or cervical lymphadenopathy.   Cardiovascular Cardiovascular examination reveals -normal heart sounds, regular rate and rhythm with no murmurs and normal pedal pulses bilaterally.  Abdomen Inspection Inspection of the abdomen reveals - No Hernias. Skin - Scar - no surgical scars. Palpation/Percussion Palpation and Percussion of the abdomen reveal - Soft, Non Tender, No Rebound tenderness, No Rigidity (guarding) and No  hepatosplenomegaly. Auscultation Auscultation of the abdomen reveals - Bowel sounds normal.  Neurologic Neurologic evaluation reveals -alert and oriented x 3 with no impairment of recent or remote memory. Mental Status-Normal.  Musculoskeletal Normal Exam - Left-Upper Extremity Strength Normal and Lower Extremity Strength Normal. Normal Exam - Right-Upper Extremity Strength Normal and Lower Extremity Strength Normal.  Lymphatic Head & Neck  General Head & Neck Lymphatics: Bilateral - Description - Normal. Axillary  General Axillary Region: Bilateral - Description - Normal. Tenderness - Non Tender. Femoral & Inguinal  Generalized Femoral & Inguinal Lymphatics: Bilateral - Description - Normal. Tenderness - Non Tender.    Assessment & Plan  MALIGNANT NEOPLASM OF UPPER-INNER QUADRANT OF LEFT BREAST IN FEMALE, ESTROGEN RECEPTOR POSITIVE (C50.212) Impression: the patient appears to have a small stage I cancer in the upper inner left breast with clinically negative nodes. I have talked her in detail about the different options for treatment and at this point she favors breast conservation which I think is a very reasonable way of treating her cancer. I have discussed with her in detail the risks and benefits of the operation as well as some of the technical aspects including the use of a wire for localization and she understands and wishes to proceed. She will also be a good candidate for sentinel node biopsy. she will then follow up after surgery with medical and radiation oncology for adjuvant therapy. Current Plans Referred to Oncology, for evaluation and follow up (Oncology). Routine. Pt Education - Breast Cancer: discussed with patient and provided information.

## 2019-01-16 NOTE — Discharge Instructions (Signed)

## 2019-01-16 NOTE — Op Note (Signed)
01/16/2019  4:06 PM  PATIENT:  Leah [name]  53 y.o. female  PRE-OPERATIVE DIAGNOSIS:  LEFT BREAST CANCER  POST-OPERATIVE DIAGNOSIS:  LEFT BREAST CANCER  PROCEDURE:  Procedure(s): LEFT BREAST LUMPECTOMY WITH NEEDLE LOCALIZATION AND DEEP LEFT AXILLARY SENTINEL LYMPH NODE BIOPSY (Left)  SURGEON:  Surgeon(s) and Role:    * Jovita Kussmaul, MD - Primary  PHYSICIAN ASSISTANT:   ASSISTANTS: none   ANESTHESIA:   local and general  EBL:  minimal   BLOOD ADMINISTERED:none  DRAINS: none   LOCAL MEDICATIONS USED:  MARCAINE     SPECIMEN:  Source of Specimen:  left breast tissue with additional deep and superior margin and sentinel node  DISPOSITION OF SPECIMEN:  PATHOLOGY  COUNTS:  YES  TOURNIQUET:  * No tourniquets in log *  DICTATION: .Dragon Dictation   After informed consent was obtained the patient was brought to the operating room and placed in the supine position on the operating table.  After adequate induction of general anesthesia the patient's left chest, breast, and axillary area were prepped with ChloraPrep, allowed to dry, and draped in usual sterile manner.  An appropriate timeout was performed.  Earlier in the day the patient underwent a wire localization procedure and the wire was entering the left breast in the upper inner quadrant and headed laterally.  Also earlier in the day the patient underwent injection of 1 mCi of technetium sulfur colloid in the subareolar position on the left.  The node seeker was then used to identify a hot spot in the left axilla.  This area was infiltrated with quarter percent Marcaine.  A small transversely oriented incision was made with a 15 blade knife overlying the hotspot.  The incision was carried through the skin and subcutaneous tissue sharply with the electrocautery until the deep left axillary space was entered.  The node seeker was then used to direct blunt hemostat dissection.  I was able to identify a hot lymph node.  This  lymph node was excised sharply with the electrocautery and the lymphatics and small vessels around it were controlled with clips.  Ex vivo counts on this node were approximately 1500.  No other hot or palpable lymph nodes were identified in the left axilla.  The deep layer of the wound was then closed with interrupted 3-0 Vicryl stitches.  The skin was then closed with a running 4-0 Monocryl subcuticular stitch.  Attention was then turned to the left breast.  The area around the wire was infiltrated with quarter percent Marcaine.  A curvilinear incision was made along the upper edge of the areola with a 15 blade knife.  The incision was carried through the skin and subcutaneous tissue sharply with the electrocautery.  Dissection was then carried out along the upper inner quadrant between the breast tissue and the subcutaneous fat and skin until we were well beyond the area of the cancer.  Next a circular portion of breast tissue was excised sharply around the path of the wire with the electrocautery while palpating the path of the wire directly.  Once the specimen was removed it was oriented with the appropriate paint colors.  A specimen radiograph was obtained that showed the clip and wire to be near the center of the specimen.  I did decide to remove an additional superior and deep margin based on the proximity of the wire to the edge of the specimen.  These were marked appropriately and also sent to pathology.  Hemostasis was achieved using  the Bovie electrocautery.  The cavity was marked with clips.  The cavity was then irrigated with saline and infiltrated with more quarter percent Marcaine.  The deep layer of the wound was then closed with layers of interrupted 3-0 Vicryl stitches.  The skin was then closed with interrupted 4 Monocryl subcuticular stitches.  Dermabond dressings were applied.  The patient tolerated the procedure well.  At the end of the case all needle sponge and instrument counts were correct.   The patient was then awakened and taken to recovery in stable condition.  PLAN OF CARE: Discharge to home after PACU  PATIENT DISPOSITION:  PACU - hemodynamically stable.   Delay start of Pharmacological VTE agent (>24hrs) due to surgical blood loss or risk of bleeding: not applicable

## 2019-01-16 NOTE — Anesthesia Procedure Notes (Signed)
Procedure Name: Intubation Performed by: Fletcher-Harrison, Daliah Chaudoin, CRNA Pre-anesthesia Checklist: Patient identified, Emergency Drugs available, Suction available and Patient being monitored Patient Re-evaluated:Patient Re-evaluated prior to induction Oxygen Delivery Method: Circle system utilized Preoxygenation: Pre-oxygenation with 100% oxygen Induction Type: IV induction Ventilation: Mask ventilation without difficulty Laryngoscope Size: McGraph and 3 Grade View: Grade I Tube type: Oral Tube size: 7.0 mm Number of attempts: 1 Airway Equipment and Method: Stylet Placement Confirmation: ETT inserted through vocal cords under direct vision,  positive ETCO2,  CO2 detector and breath sounds checked- equal and bilateral Secured at: 21 cm Tube secured with: Tape Dental Injury: Teeth and Oropharynx as per pre-operative assessment        

## 2019-01-16 NOTE — Anesthesia Postprocedure Evaluation (Signed)
Anesthesia Post Note  Patient: Leah [name]  Procedure(s) Performed: LEFT BREAST LUMPECTOMY WITH NEEDLE LOCALIZATION AND SENTINEL LYMPH NODE BIOPSY (Left Breast)  Patient location during evaluation: PACU Anesthesia Type: General Level of consciousness: awake and alert Pain management: pain level controlled Vital Signs Assessment: post-procedure vital signs reviewed and stable Respiratory status: spontaneous breathing, nonlabored ventilation, respiratory function stable and patient connected to nasal cannula oxygen Cardiovascular status: blood pressure returned to baseline and stable Postop Assessment: no apparent nausea or vomiting Anesthetic complications: no     Last Vitals:  Vitals:   01/16/19 1649 01/16/19 1704  BP: (!) 127/98 (!) 147/79  Pulse: 90 80  Resp: (!) 24 16  Temp: 36.5 C 36.4 C  SpO2: 94% 97%    Last Pain:  Vitals:   01/16/19 1704  TempSrc: Temporal  PainSc: 0-No pain                 Precious Haws Michio Thier

## 2019-01-17 ENCOUNTER — Encounter: Payer: Self-pay | Admitting: General Surgery

## 2019-01-18 ENCOUNTER — Encounter: Payer: Self-pay | Admitting: Oncology

## 2019-01-18 ENCOUNTER — Encounter: Payer: Self-pay | Admitting: Internal Medicine

## 2019-01-18 LAB — SURGICAL PATHOLOGY

## 2019-01-21 ENCOUNTER — Other Ambulatory Visit: Payer: Self-pay | Admitting: *Deleted

## 2019-01-21 ENCOUNTER — Encounter: Payer: Self-pay | Admitting: *Deleted

## 2019-01-21 ENCOUNTER — Other Ambulatory Visit: Payer: Self-pay | Admitting: Anatomic Pathology & Clinical Pathology

## 2019-01-22 ENCOUNTER — Telehealth: Payer: Self-pay | Admitting: Licensed Clinical Social Worker

## 2019-01-22 NOTE — Telephone Encounter (Signed)
Called Leah [name] to schedule a genetic counseling appointment. She will see me 11/16 at 4 pm via Webex.

## 2019-01-23 ENCOUNTER — Ambulatory Visit: Payer: Self-pay | Admitting: General Surgery

## 2019-01-24 ENCOUNTER — Other Ambulatory Visit: Payer: Self-pay

## 2019-01-24 ENCOUNTER — Encounter: Payer: Self-pay | Admitting: *Deleted

## 2019-01-24 ENCOUNTER — Inpatient Hospital Stay: Payer: Medicaid Other | Attending: Oncology | Admitting: Oncology

## 2019-01-24 ENCOUNTER — Encounter: Payer: Self-pay | Admitting: Oncology

## 2019-01-24 VITALS — BP 134/90 | HR 83 | Temp 98.3°F | Wt 168.0 lb

## 2019-01-24 DIAGNOSIS — E785 Hyperlipidemia, unspecified: Secondary | ICD-10-CM | POA: Insufficient documentation

## 2019-01-24 DIAGNOSIS — J45909 Unspecified asthma, uncomplicated: Secondary | ICD-10-CM | POA: Diagnosis not present

## 2019-01-24 DIAGNOSIS — Z8042 Family history of malignant neoplasm of prostate: Secondary | ICD-10-CM | POA: Insufficient documentation

## 2019-01-24 DIAGNOSIS — Z803 Family history of malignant neoplasm of breast: Secondary | ICD-10-CM | POA: Diagnosis not present

## 2019-01-24 DIAGNOSIS — Z170 Estrogen receptor positive status [ER+]: Secondary | ICD-10-CM | POA: Insufficient documentation

## 2019-01-24 DIAGNOSIS — Z7189 Other specified counseling: Secondary | ICD-10-CM | POA: Diagnosis not present

## 2019-01-24 DIAGNOSIS — Z791 Long term (current) use of non-steroidal anti-inflammatories (NSAID): Secondary | ICD-10-CM | POA: Diagnosis not present

## 2019-01-24 DIAGNOSIS — Z79899 Other long term (current) drug therapy: Secondary | ICD-10-CM | POA: Diagnosis not present

## 2019-01-24 DIAGNOSIS — Z87891 Personal history of nicotine dependence: Secondary | ICD-10-CM | POA: Insufficient documentation

## 2019-01-24 DIAGNOSIS — C50212 Malignant neoplasm of upper-inner quadrant of left female breast: Secondary | ICD-10-CM | POA: Insufficient documentation

## 2019-01-24 NOTE — Progress Notes (Signed)
Patient stated that she had surgery on 01-16-2019 by Dr. Marlou Starks.

## 2019-01-24 NOTE — Progress Notes (Signed)
Met patient today during her post surgery follow up with Dr. Janese Banks.  She has one positive lymph node.  Dr. Janese Banks plans to order mammoprint testing.  Patient stated she was going back for re-excision.  Called Dr. Marlou Starks and he confirmed that the anterior margin next to the skin was positive.  Called and explained to patient that we could possible been reading about the posterior margin.  She is planning to move forward with surgery.

## 2019-01-25 NOTE — Progress Notes (Signed)
Hematology/Oncology Consult note [facility]  [phone] [phone]  Patient Care Team: McLean-Scocuzza, Nino Glow, MD as PCP - General (Internal Medicine) Rico Junker, RN as Registered Nurse   Name of the patient: Leah [name]  [id number]  04-17-1965   Date of visit: 01/25/19  Diagnosis- 1.  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  This was followed by an ultrasound and Core biopsy.  Ultrasound showed 1 x 1.1 x 0.7 cm hypoechoic mass at the 10:30 position of the left breast.  Left axilla appeared normal.  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   Final pathology showed 9 mm grade 1 invasive mammary carcinoma 1 out of 2 lymph nodes involved with macro metastatic carcinoma 3.5 mm.  Anterior margin was focally positive for which patient will be undergoing reexcision.  PT1BPN1a (sn)  Interval history-patient is healing well from her lumpectomy.  She reports some soreness at the site but denies other complaints  ECOG PS- 0 Pain scale- 0   Review of systems- Review of Systems  Constitutional: Negative for chills, fever, malaise/fatigue and weight loss.  HENT: Negative for congestion, ear discharge and nosebleeds.   Eyes: Negative for blurred vision.  Respiratory: Negative for cough, hemoptysis, sputum production, shortness of breath and wheezing.   Cardiovascular: Negative for chest pain, palpitations, orthopnea and claudication.  Gastrointestinal: Negative for abdominal pain, blood in stool, constipation,  diarrhea, heartburn, melena, nausea and vomiting.  Genitourinary: Negative for dysuria, flank pain, frequency, hematuria and urgency.  Musculoskeletal: Negative for back pain, joint pain and myalgias.  Skin: Negative for rash.  Neurological: Negative for dizziness, tingling, focal weakness, seizures, weakness and headaches.  Endo/Heme/Allergies: Does not bruise/bleed easily.  Psychiatric/Behavioral: Negative for depression and suicidal ideas. The patient does not have insomnia.       Allergies  Allergen Reactions  . Hydrocodone-Acetaminophen Hives    All over her body.  . Lactose Intolerance (Gi) Other (See Comments)    Bloating and GI upset  . Penicillins Rash    Did it involve swelling of the face/tongue/throat, SOB, or low BP? Unknown Did it involve sudden or severe rash/hives, skin peeling, or any reaction on the inside of your mouth or nose? Yes Did you need to seek medical attention at a hospital or doctor's office? Unknown When did it last happen? Childhood reaction. If all above answers are "NO", may proceed with cephalosporin use.   . Sulfa Antibiotics Rash    Full body rash      Past Medical History:  Diagnosis Date  . Anxiety   . Asthma    as a child  . Carpal tunnel syndrome    R hand   . Colon polyps   . Depression   . Eosinophilic esophagitis    noted in [place] with GI path report 09/29/14   . Esophageal stricture    s/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 Edwin Cap   . Herniated disc, cervical    s/p epidural injection  . History of kidney stones   . Hyperlipidemia   . Low back pain   .  Migraines      Past Surgical History:  Procedure Laterality Date  . BREAST BIOPSY Left 12/25/2018   [place] BX, PATH PENDING  . BREAST LUMPECTOMY WITH NEEDLE LOCALIZATION AND AXILLARY SENTINEL LYMPH NODE BX Left 01/16/2019   Procedure: LEFT BREAST LUMPECTOMY WITH NEEDLE LOCALIZATION AND SENTINEL LYMPH NODE BIOPSY;  Surgeon: Jovita Kussmaul, MD;   Location: [facility] ORS;  Service: General;  Laterality: Left;  . CESAREAN SECTION    . COLONOSCOPY WITH PROPOFOL N/A 03/27/2017   Procedure: COLONOSCOPY WITH PROPOFOL;  Surgeon: Jonathon Bellows, MD;  Location: [facility];  Service: Gastroenterology;  Laterality: N/A;  . ESOPHAGOGASTRODUODENOSCOPY    . ESOPHAGOGASTRODUODENOSCOPY (EGD) WITH PROPOFOL N/A 03/27/2017   Procedure: ESOPHAGOGASTRODUODENOSCOPY (EGD) WITH PROPOFOL WITH DILATION;  Surgeon: Jonathon Bellows, MD;  Location: [facility] ENDOSCOPY;  Service: Gastroenterology;  Laterality: N/A;  . THROAT SURGERY  2015  . UMBILICAL HERNIA REPAIR  2006    x 2   w mesh per pt    Social History   Socioeconomic History  . Marital status: Single    Spouse name: Not on file  . Number of children: Not on file  . Years of education: Not on file  . Highest education level: Not on file  Occupational History  . Not on file  Social Needs  . Financial resource strain: Not on file  . Food insecurity    Worry: Not on file    Inability: Not on file  . Transportation needs    Medical: Not on file    Non-medical: Not on file  Tobacco Use  . Smoking status: Former Smoker    Packs/day: 1.00    Years: 20.00    Pack years: 20.00    Quit date: 06/02/2006    Years since quitting: 12.6  . Smokeless tobacco: Never Used  Substance and Sexual Activity  . Alcohol use: Yes    Alcohol/week: 2.0 - 3.0 standard drinks    Types: 2 - 3 Standard drinks or equivalent per week    Comment: socially  . Drug use: No  . Sexual activity: Yes    Partners: Male  Lifestyle  . Physical activity    Days per week: Not on file    Minutes per session: Not on file  . Stress: Not on file  Relationships  . Social Herbalist on phone: Not on file    Gets together: Not on file    Attends religious service: Not on file    Active member of club or organization: Not on file    Attends meetings of clubs or organizations: Not on file    Relationship status: Not on file  .  Intimate partner violence    Fear of current or ex partner: Not on file    Emotionally abused: Not on file    Physically abused: Not on file    Forced sexual activity: Not on file  Other Topics Concern  . 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 As of 02/26/17    Divorced.    Former smoker 20+ years quit in 1997 then started again for 5 years and quit 10-12 years ago as of 03/08/17     Family History  Problem Relation Age of Onset  . Breast cancer Mother   . Thyroid disease Mother   .  Colon polyps Mother   . Prostate cancer Father   . Cancer - Prostate Father   . Breast cancer Maternal Grandmother      Current Outpatient Medications:  .  acetaminophen (TYLENOL) 500 MG tablet, Take 500-1,000 mg by mouth every 6 (six) hours as needed (for pain.)., Disp: , Rfl:  .  Cholecalciferol (VITAMIN D-3) 125 MCG (5000 UT) TABS, Take 5,000 Units by mouth daily., Disp: , Rfl:  .  Cyanocobalamin (VITAMIN B-12 PO), Take 1 tablet by mouth daily., Disp: , Rfl:  .  fluticasone (FLONASE) 50 MCG/ACT nasal spray, Place 2 sprays into both nostrils daily. Max 2 sprays (Patient taking differently: Place 2 sprays into both nostrils at bedtime. Max 2 sprays), Disp: 16 g, Rfl: 12 .  gabapentin (NEURONTIN) 300 MG capsule, Take 300 mg by mouth at bedtime., Disp: , Rfl:  .  meclizine (ANTIVERT) 12.5 MG tablet, Take 1 tablet (12.5 mg total) by mouth 3 (three) times daily as needed for dizziness., Disp: 90 tablet, Rfl: 1 .  meloxicam (MOBIC) 15 MG tablet, Take 15 mg by mouth daily., Disp: , Rfl:  .  methocarbamol (ROBAXIN) 750 MG tablet, Take 1 tablet (750 mg total) by mouth every 6 (six) hours as needed for muscle spasms., Disp: 120 tablet, Rfl: 5 .  montelukast (SINGULAIR) 10 MG tablet, Take 1 tablet (10 mg total) by mouth at bedtime., Disp: 90 tablet, Rfl: 3 .  omeprazole (PRILOSEC)  20 MG capsule, Take 20 mg by mouth every evening., Disp: , Rfl:  .  tiZANidine (ZANAFLEX) 4 MG tablet, Take 4 mg by mouth at bedtime. , Disp: , Rfl:  .  traMADol (ULTRAM) 50 MG tablet, Take 1-2 tablets (50-100 mg total) by mouth every 6 (six) hours as needed., Disp: 20 tablet, Rfl: 0 .  traZODone (DESYREL) 50 MG tablet, Take 1-1.5 tablets (50-75 mg total) by mouth at bedtime as needed for sleep. (Patient taking differently: Take 50 mg by mouth at bedtime as needed for sleep. ), Disp: 135 tablet, Rfl: 4  Physical exam:  Vitals:   01/24/19 1050  BP: 134/90  Pulse: 83  Temp: 98.3 F (36.8 C)  TempSrc: Tympanic  Weight: 168 lb (76.2 kg)   Physical Exam Constitutional:      General: She is not in acute distress. HENT:     Head: Normocephalic and atraumatic.  Eyes:     Pupils: Pupils are equal, round, and reactive to light.  Neck:     Musculoskeletal: Normal range of motion.  Cardiovascular:     Rate and Rhythm: Normal rate and regular rhythm.     Heart sounds: Normal heart sounds.  Pulmonary:     Effort: Pulmonary effort is normal.     Breath sounds: Normal breath sounds.  Abdominal:     General: Bowel sounds are normal.     Palpations: Abdomen is soft.  Skin:    General: Skin is warm and dry.  Neurological:     Mental Status: She is alert and oriented to person, place, and time.    Patient is s/p left lumpectomy and sentinel lymph node biopsy withSurgical scar that appears to be well-healing.  CMP Latest Ref Rng & Units 10/04/2018  Glucose 70 - 99 mg/dL 99  BUN 6 - 23 mg/dL 17  Creatinine 0.40 - 1.20 mg/dL 0.67  Sodium 135 - 145 mEq/L 138  Potassium 3.5 - 5.1 mEq/L 4.0  Chloride 96 - 112 mEq/L 103  CO2 19 - 32 mEq/L 25  Calcium  8.4 - 10.5 mg/dL 9.7  Total Protein 6.0 - 8.3 g/dL 6.8  Total Bilirubin 0.2 - 1.2 mg/dL 0.5  Alkaline Phos 39 - 117 U/L 42  AST 0 - 37 U/L 14  ALT 0 - 35 U/L 14   CBC Latest Ref Rng & Units 10/04/2018  WBC 4.0 - 10.5 K/uL 6.9  Hemoglobin 12.0  - 15.0 g/dL 16.1(H)  Hematocrit 36.0 - 46.0 % 46.0  Platelets 150.0 - 400.0 K/uL 335.0    No images are attached to the encounter.  Nm Sentinel Node Injection  Result Date: 01/16/2019 CLINICAL DATA:  Left breast cancer. EXAM: NUCLEAR MEDICINE BREAST LYMPHOSCINTIGRAPHY TECHNIQUE: Intradermal injection of radiopharmaceutical was performed at the 12 o'clock, 3 o'clock, 6 o'clock, and 9 o'clock positions around the left nipple. The patient was then sent to the operating room where the sentinel node(s) were identified and removed by the surgeon. RADIOPHARMACEUTICALS:  Total of 0.946 mCi Millipore-filtered Technetium-65msulfur colloid, injected in four aliquots of 0.25 mCi each. IMPRESSION: Uncomplicated intradermal injection of a total of 1 mCi Technetium-963mulfur colloid for purposes of sentinel node identification. Electronically Signed   By: HeKathreen Devoid On: 01/16/2019 12:43   Mm Breast Surgical Specimen  Result Date: 01/16/2019 CLINICAL DATA:  Status post lumpectomy today after earlier needle localization. EXAM: SPECIMEN RADIOGRAPH OF THE LEFT BREAST COMPARISON:  Previous exam(s). FINDINGS: Status post excision of the left breast. The wire tip and biopsy marker clip are present and are marked for pathology. IMPRESSION: Specimen radiograph of the left breast. Electronically Signed   By: StFranki Cabot.D.   On: 01/16/2019 16:41   Us[place]ar Nerve Block-image Only ([facility])  Result Date: 01/16/2019 There is no interpretation for this exam.  This order is for images obtained during a surgical procedure.  Please See "Surgeries" Tab for more information regarding the procedure.   Mm Lt Plc Breast Loc Dev   1st Lesion  Inc Mammo Guide  Result Date: 01/16/2019 CLINICAL DATA:  Patient with a LEFT breast carcinoma scheduled for lumpectomy today requiring preoperative needle localization. EXAM: NEEDLE LOCALIZATION OF THE LEFT BREAST WITH MAMMO GUIDANCE COMPARISON:  Previous exams. FINDINGS: Patient presents  for needle localization prior to lumpectomy. I met with the patient and we discussed the procedure of needle localization including benefits and alternatives. We discussed the high likelihood of a successful procedure. We discussed the risks of the procedure, including infection, bleeding, tissue injury, and further surgery. Informed, written consent was given. The usual time-out protocol was performed immediately prior to the procedure. Using mammographic guidance, sterile technique, 1% lidocaine and a 9 cm modified Kopans needle, the coil shaped clip within the inner LEFT breast was localized using medial approach. The images were marked for Dr. ToMarlou StarksIMPRESSION: Needle localization LEFT breast. No apparent complications. Electronically Signed   By: StFranki Cabot.D.   On: 01/16/2019 11:29     Assessment and plan- Patient is a [age].o. female with prior history of polycythemia now with newly diagnosed invasive mammary carcinoma pathologic prognostic stage Ia of the left breast pT1b pN1 acM0 ER/PR positive HER-2/neu negative  I discussed the results of the final pathology with the patient in detail.  I also confirmed with Dr. ToMarlou Starkshat there was indeed a unifocal positive anterior margin which he plans to reexcise.  Final pathology showed a 9 mm grade 1 strongly ER/PR positive HER-2/neu negative tumor with 1 out of 2 sentinel lymph node that was positive.  Given the low-grade  and strongly ER/PR positive and the I would recommend MammaPrint testing to determine if patient would benefit from adjuvant chemotherapy.  With single node positive disease she is clinically high risk.  Discussed what MammaPrint testing is and how the results are interpreted.  If results come back as low risk then she would not benefit from adjuvant chemotherapy.  However if she falls in the high risk group she would benefit from adjuvant chemotherapy.  I will tentatively see her back in 2 weeks time to discuss the results of  MammaPrint testing and further management.  She will also see Dr. Donella Stade from radiation oncology at the same time.  Treatment will be given with a curative intent.  I will discuss hormone therapy with her in greater detail after MammaPrint testing results are back  Cancer Staging Breast cancer [facility]) Staging form: Breast, AJCC 8th Edition - Clinical stage from 01/03/2019: Stage IA (cT1c, cN0, cM0, G2, ER+, PR+, HER2-) - Signed by Sindy Guadeloupe, MD on 01/03/2019 - Pathologic stage from 01/24/2019: Stage IA (pT1b, pN1a, cM0, G1, ER+, PR+, HER2-) - Signed by Sindy Guadeloupe, MD on 01/25/2019     Visit Diagnosis 1. Malignant neoplasm of upper-inner quadrant of left breast in female, estrogen receptor positive ([place])   2. Goals of care, counseling/discussion      Dr. Randa Evens, MD, MPH [facility] at [facility] [phone] 01/25/2019 9:08 AM

## 2019-01-28 ENCOUNTER — Inpatient Hospital Stay: Payer: Medicaid Other | Attending: Licensed Clinical Social Worker | Admitting: Licensed Clinical Social Worker

## 2019-01-28 ENCOUNTER — Encounter: Payer: Self-pay | Admitting: Licensed Clinical Social Worker

## 2019-01-28 DIAGNOSIS — C50212 Malignant neoplasm of upper-inner quadrant of left female breast: Secondary | ICD-10-CM | POA: Diagnosis not present

## 2019-01-28 DIAGNOSIS — Z8042 Family history of malignant neoplasm of prostate: Secondary | ICD-10-CM

## 2019-01-28 DIAGNOSIS — Z170 Estrogen receptor positive status [ER+]: Secondary | ICD-10-CM

## 2019-01-28 DIAGNOSIS — Z803 Family history of malignant neoplasm of breast: Secondary | ICD-10-CM | POA: Diagnosis not present

## 2019-01-28 NOTE — Progress Notes (Signed)
REFERRING PROVIDER: Sindy Guadeloupe, MD [address],  [place] [address]  PRIMARY PROVIDER:  McLean-Scocuzza, Nino Glow, MD  PRIMARY REASON FOR VISIT:  1. Malignant neoplasm of upper-inner quadrant of left breast in female, estrogen receptor positive ([place])   2. Family history of breast cancer   3. Family history of prostate cancer    I connected with Leah [name] on 01/28/2019 at 4:00 PM EDT by Webex and verified that I am speaking with the correct person using two identifiers.    Patient location: home Provider location: office   HISTORY OF PRESENT ILLNESS:   Leah [name], a 53 y.o. female, was seen for a [place] cancer genetics consultation at the request of Dr. Janese Banks due to a personal and family history of cancer.  Leah [name] presents to clinic today to discuss the possibility of a hereditary predisposition to cancer, genetic testing, and to further clarify her future cancer risks, as well as potential cancer risks for family members.   In 2020, at the age of 26, Leah [name] was diagnosed with [facility] of the left breast, ER+/PR+/Her2-. She had a lumpectomy on 01/16/2019 and will have a re-excision on 02/15/2019. She reports she has been thinking about mastectomy instead of lumpectomy and feels genetic test results may help her decide.     CANCER HISTORY:  Oncology History  Breast cancer ([place])  12/28/2018 Initial Diagnosis   Breast cancer ([place])   01/03/2019 Cancer Staging   Staging form: Breast, AJCC 8th Edition - Clinical stage from 01/03/2019: Stage IA (cT1c, cN0, cM0, G2, ER+, PR+, HER2-) - Signed by Sindy Guadeloupe, MD on 01/03/2019   01/24/2019 Cancer Staging   Staging form: Breast, AJCC 8th Edition - Pathologic stage from 01/24/2019: Stage IA (pT1b, pN1a, cM0, G1, ER+, PR+, HER2-) - Signed by Sindy Guadeloupe, MD on 01/25/2019      RISK FACTORS:  Menarche was at age 89.  First live birth at age 49.  OCP use: yes Ovaries intact: yes.  Hysterectomy: no.  Menopausal  status: premenopausal.  HRT use: 0 years. Colonoscopy: yes; she reports 5 polyps found. Mammogram within the last year: yes. Number of breast biopsies: 1. Up to date with pelvic exams: yes. Any excessive radiation exposure in the past: no  Past Medical History:  Diagnosis Date  . Anxiety   . Asthma    as a child  . Carpal tunnel syndrome    R hand   . Colon polyps   . Depression   . Eosinophilic esophagitis    noted in [place] with GI path report 09/29/14   . Esophageal stricture    s/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 Edwin Cap   . Family history of breast cancer   . Family history of prostate cancer   . Herniated disc, cervical    s/p epidural injection  . History of kidney stones   . Hyperlipidemia   . Low back pain   . Migraines     Past Surgical History:  Procedure Laterality Date  . BREAST BIOPSY Left 12/25/2018   [place] BX, PATH PENDING  . BREAST LUMPECTOMY WITH NEEDLE LOCALIZATION AND AXILLARY SENTINEL LYMPH NODE BX Left 01/16/2019   Procedure: LEFT BREAST LUMPECTOMY WITH NEEDLE LOCALIZATION AND SENTINEL LYMPH NODE BIOPSY;  Surgeon: Jovita Kussmaul, MD;  Location: [facility] ORS;  Service: General;  Laterality: Left;  . CESAREAN SECTION    . COLONOSCOPY WITH PROPOFOL N/A 03/27/2017   Procedure: COLONOSCOPY WITH PROPOFOL;  Surgeon: Jonathon Bellows, MD;  Location: [facility] ENDOSCOPY;  Service: Gastroenterology;  Laterality: N/A;  . ESOPHAGOGASTRODUODENOSCOPY    . ESOPHAGOGASTRODUODENOSCOPY (EGD) WITH PROPOFOL N/A 03/27/2017   Procedure: ESOPHAGOGASTRODUODENOSCOPY (EGD) WITH PROPOFOL WITH DILATION;  Surgeon: Jonathon Bellows, MD;  Location: [facility] ENDOSCOPY;  Service: Gastroenterology;  Laterality: N/A;  . THROAT SURGERY  2015  . UMBILICAL HERNIA REPAIR  2006    x 2   w mesh per pt    Social History   Socioeconomic History  . Marital status: Single    Spouse name: Not on file  . Number of children: Not on file  . Years of education: Not on file  . Highest  education level: Not on file  Occupational History  . Not on file  Social Needs  . Financial resource strain: Not on file  . Food insecurity    Worry: Not on file    Inability: Not on file  . Transportation needs    Medical: Not on file    Non-medical: Not on file  Tobacco Use  . Smoking status: Former Smoker    Packs/day: 1.00    Years: 20.00    Pack years: 20.00    Quit date: 06/02/2006    Years since quitting: 12.6  . Smokeless tobacco: Never Used  Substance and Sexual Activity  . Alcohol use: Yes    Alcohol/week: 2.0 - 3.0 standard drinks    Types: 2 - 3 Standard drinks or equivalent per week    Comment: socially  . Drug use: No  . Sexual activity: Yes    Partners: Male  Lifestyle  . Physical activity    Days per week: Not on file    Minutes per session: Not on file  . Stress: Not on file  Relationships  . Social Herbalist on phone: Not on file    Gets together: Not on file    Attends religious service: Not on file    Active member of club or organization: Not on file    Attends meetings of clubs or organizations: Not on file    Relationship status: Not on file  Other Topics Concern  . 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 As of 02/26/17    Divorced.    Former smoker 20+ years quit in 1997 then started again for 5 years and quit 10-12 years ago as of 03/08/17      FAMILY HISTORY:  We obtained a detailed, 4-generation family history.  Significant diagnoses are listed below: Family History  Problem Relation Age of Onset  . Breast cancer Mother        dx late 56s  . Thyroid disease Mother   . Colon polyps Mother   . Prostate cancer Father        dx 33  . Breast cancer Maternal Grandmother        dx late 25s  . Cancer Maternal Uncle        possibly, unsure type   Leah [name] has a daughter, 43, and son, 27, no  history of cancer. Patient has 1 sister, 51, no history of cancer.   Leah [name] mother is living at 73 and had breast cancer in her late 39s. Patient had 2 maternal uncles, one is deceased, possibly from cancer. The other Is living  with no cancer history. No known cancers in maternal cousins. Maternal grandmother had breast cancer in her late 67s and died at 28.   Leah [name] father is living at 9 and had prostate cancer at 89. Patient had 1 paternal aunt, no cancer. One of her paternal cousins had cancer but she is unsure the type. Paternal grandmother passed at 71, no info about paternal grandfather.  Leah [name] is unaware of previous family history of genetic testing for hereditary cancer risks.There is no reported Ashkenazi Jewish ancestry. There is no known consanguinity.  GENETIC COUNSELING ASSESSMENT: Leah [name] is a 53 y.o. female with a personal and family history which is somewhat suggestive of a hereditary cancer syndrome and predisposition to cancer. We, therefore, discussed and recommended the following at today's visit.   DISCUSSION: We discussed that 5 - 10% of breast cancer is hereditary, with most cases associated with BRCA1/BRCA2 mutations.  There are other genes that can be associated with hereditary breast cancer syndromes.  We discussed that testing is beneficial for several reasons including surgical decision-making for breast cancer, knowing how to follow individuals after completing their treatment, and understand if other family members could be at risk for cancer and allow them to undergo genetic testing.   We reviewed the characteristics, features and inheritance patterns of hereditary cancer syndromes. We also discussed genetic testing, including the appropriate family members to test, the process of testing, insurance coverage and turn-around-time for results. We discussed the implications of a negative, positive and/or variant of uncertain significant result. We  recommended Leah [name] pursue genetic testing for the [facility] Common Hereditary Cancers gene panel.  The Common Hereditary Cancers Panel offered by [facility] includes sequencing and/or deletion duplication testing of the following 48 genes: APC, ATM, AXIN2, BARD1, BMPR1A, BRCA1, BRCA2, BRIP1, CDH1, CDKN2A (p14ARF), CDKN2A (p16INK4a), CKD4, CHEK2, CTNNA1, DICER1, EPCAM (Deletion/duplication testing only), GREM1 (promoter region deletion/duplication testing only), KIT, MEN1, MLH1, MSH2, MSH3, MSH6, MUTYH, NBN, NF1, NHTL1, PALB2, PDGFRA, PMS2, POLD1, POLE, PTEN, RAD50, RAD51C, RAD51D, RNF43, SDHB, SDHC, SDHD, SMAD4, SMARCA4. STK11, TP53, TSC1, TSC2, and VHL.  The following genes were evaluated for sequence changes only: SDHA and HOXB13 c.251G>A variant only.   Based on Leah [name] personal and family history of cancer, she meets medical criteria for genetic testing. Despite that she meets criteria, she may still have an out of pocket cost.   PLAN: After considering the risks, benefits, and limitations, Leah [name] provided informed consent to pursue genetic testing and the blood sample was sent to [facility] for analysis of the Common Hereditary Cancers Panel. Results should be available within approximately 2-3 weeks' time, at which point they will be disclosed by telephone to Leah [name], as will any additional recommendations warranted by these results. Leah [name] will receive a summary of her genetic counseling visit and a copy of her results once available. This information will also be available in Epic.   Leah [name] questions were answered to her satisfaction today. Our contact information was provided should additional questions or concerns arise. Thank you for the referral and allowing [place] to share in the care of your patient.   Faith Rogue, MS, [facility] Genetic Counselor [email or web address] Phone: [phone]  The patient was seen for a total of 25 minutes in  face-to-face genetic counseling.  Drs. Magrinat, Lindi Adie and/or Burr Medico were available for discussion regarding this case.   _______________________________________________________________________ For Office Staff:  Number of people involved in session: 1 Was an Intern/ student involved with case: no

## 2019-01-29 ENCOUNTER — Encounter: Payer: Self-pay | Admitting: Licensed Clinical Social Worker

## 2019-01-29 ENCOUNTER — Ambulatory Visit: Payer: Self-pay | Admitting: Licensed Clinical Social Worker

## 2019-01-29 ENCOUNTER — Telehealth: Payer: Self-pay | Admitting: Licensed Clinical Social Worker

## 2019-01-29 DIAGNOSIS — Z1379 Encounter for other screening for genetic and chromosomal anomalies: Secondary | ICD-10-CM

## 2019-01-29 DIAGNOSIS — Z8042 Family history of malignant neoplasm of prostate: Secondary | ICD-10-CM

## 2019-01-29 DIAGNOSIS — Z803 Family history of malignant neoplasm of breast: Secondary | ICD-10-CM

## 2019-01-29 NOTE — Telephone Encounter (Signed)
Revealed negative genetic testing.  Revealed that a VUS in BARD1 was identified. This normal result is reassuring and indicates that it is unlikely Leah [name]'s cancer is due to a hereditary cause.  It is unlikely that there is an increased risk of another cancer due to a mutation in one of these genes.  However, genetic testing is not perfect, and cannot definitively rule out a hereditary cause.  It will be important for her to keep in contact with genetics to learn if any additional testing may be needed in the future.

## 2019-01-29 NOTE — Progress Notes (Signed)
HPI:  Leah [name] was previously seen in the [place] clinic due to a personal and family history of breast cancer and concerns regarding a hereditary predisposition to cancer. Please refer to our prior cancer genetics clinic note for more information regarding our discussion, assessment and recommendations, at the time. Leah [name] recent genetic test results were disclosed to her, as were recommendations warranted by these results. These results and recommendations are discussed in more detail below.  CANCER HISTORY:  Oncology History  Breast cancer ([place])  12/28/2018 Initial Diagnosis   Breast cancer ([place])   01/03/2019 Cancer Staging   Staging form: Breast, AJCC 8th Edition - Clinical stage from 01/03/2019: Stage IA (cT1c, cN0, cM0, G2, ER+, PR+, HER2-) - Signed by Sindy Guadeloupe, MD on 01/03/2019   01/24/2019 Cancer Staging   Staging form: Breast, AJCC 8th Edition - Pathologic stage from 01/24/2019: Stage IA (pT1b, pN1a, cM0, G1, ER+, PR+, HER2-) - Signed by Sindy Guadeloupe, MD on 01/25/2019    Genetic Testing   No pathogenic variants identified. VUS in BARD1 called c.1672T>C (p.Ser558Pro) identified on the [facility] Common Hereditary Cancers Panel. The report date is 01/29/2019.  The Common Hereditary Cancers Panel offered by [facility] includes sequencing and/or deletion duplication testing of the following 47 genes: APC, ATM, AXIN2, BARD1, BMPR1A, BRCA1, BRCA2, BRIP1, CDH1, CDKN2A (p14ARF), CDKN2A (p16INK4a), CKD4, CHEK2, CTNNA1, DICER1, EPCAM (Deletion/duplication testing only), GREM1 (promoter region deletion/duplication testing only), KIT, MEN1, MLH1, MSH2, MSH3, MSH6, MUTYH, NBN, NF1, NHTL1, PALB2, PDGFRA, PMS2, POLD1, POLE, PTEN, RAD50, RAD51C, RAD51C, SDHB, SDHC, SDHD, SMAD4, SMARCA4. STK11, TP53, TSC1, TSC2, and VHL.  The following genes were evaluated for sequence changes only: SDHA and HOXB13 c.251G>A variant only.     FAMILY HISTORY:  We obtained a detailed,  4-generation family history.  Significant diagnoses are listed below: Family History  Problem Relation Age of Onset  . Breast cancer Mother        dx late 26s  . Thyroid disease Mother   . Colon polyps Mother   . Prostate cancer Father        dx 56  . Breast cancer Maternal Grandmother        dx late 78s  . Cancer Maternal Uncle        possibly, unsure type   Leah [name] has a daughter, 75, and son, 13, no history of cancer. Patient has 1 sister, 76, no history of cancer.   Leah [name] mother is living at 65 and had breast cancer in her late 27s. Patient had 2 maternal uncles, one is deceased, possibly from cancer. The other Is living with no cancer history. No known cancers in maternal cousins. Maternal grandmother had breast cancer in her late 72s and died at 32.   Leah [name] father is living at 32 and had prostate cancer at 8. Patient had 1 paternal aunt, no cancer. One of her paternal cousins had cancer but she is unsure the type. Paternal grandmother passed at 72, no info about paternal grandfather.  Leah [name] is unaware of previous family history of genetic testing for hereditary cancer risks.There is no reported Ashkenazi Jewish ancestry. There is no known consanguinity.   GENETIC TEST RESULTS: Genetic testing reported out on  through the [facility] Common Hereditary cancer panel found no pathogenic mutations.   The Common Hereditary Cancers Panel offered by [facility] includes sequencing and/or deletion duplication testing of the following 47 genes: APC, ATM, AXIN2, BARD1, BMPR1A, BRCA1, BRCA2, BRIP1, CDH1, CDKN2A (p14ARF),  CDKN2A (p16INK4a), CKD4, CHEK2, CTNNA1, DICER1, EPCAM (Deletion/duplication testing only), GREM1 (promoter region deletion/duplication testing only), KIT, MEN1, MLH1, MSH2, MSH3, MSH6, MUTYH, NBN, NF1, NHTL1, PALB2, PDGFRA, PMS2, POLD1, POLE, PTEN, RAD50, RAD51C, RAD51D, SDHB, SDHC, SDHD, SMAD4, SMARCA4. STK11, TP53, TSC1, TSC2, and VHL.  The following genes  were evaluated for sequence changes only: SDHA and HOXB13 c.251G>A variant only.  The test report has been scanned into EPIC and is located under the Molecular Pathology section of the Results Review tab.  A portion of the result report is included below for reference.     We discussed with Leah [name] that because current genetic testing is not perfect, it is possible there may be a gene mutation in one of these genes that current testing cannot detect, but that chance is small.  We also discussed, that there could be another gene that has not yet been discovered, or that we have not yet tested, that is responsible for the cancer diagnoses in the family. It is also possible there is a hereditary cause for the cancer in the family that Leah [name] did not inherit and therefore was not identified in her testing.  Therefore, it is important to remain in touch with cancer genetics in the future so that we can continue to offer Leah [name] the most up to date genetic testing.   Genetic testing did identify a variant of uncertain significance (VUS) was identified in the BARD1 gene.  At this time, it is unknown if this variant is associated with increased cancer risk or if this is a normal finding, but most variants such as this get reclassified to being inconsequential. It should not be used to make medical management decisions. With time, we suspect the lab will determine the significance of this variant, if any. If we do learn more about it, we will try to contact Leah [name] to discuss it further. However, it is important to stay in touch with [place] periodically and keep the address and phone number up to date.  ADDITIONAL GENETIC TESTING: We discussed with Leah [name] that her genetic testing was fairly extensive.  If there are genes identified to increase cancer risk that can be analyzed in the future, we would be happy to discuss and coordinate this testing at that time.    CANCER SCREENING  RECOMMENDATIONS: Leah [name] test result is considered negative (normal).  This means that we have not identified a hereditary cause for her personal and family history of cancer at this time. Most cancers happen by chance and this negative test suggests that her cancer may fall into this category.    While reassuring, this does not definitively rule out a hereditary predisposition to cancer. It is still possible that there could be genetic mutations that are undetectable by current technology. There could be genetic mutations in genes that have not been tested or identified to increase cancer risk.  Therefore, it is recommended she continue to follow the cancer management and screening guidelines provided by her oncology and primary healthcare provider.   An individual's cancer risk and medical management are not determined by genetic test results alone. Overall cancer risk assessment incorporates additional factors, including personal medical history, family history, and any available genetic information that may result in a personalized plan for cancer prevention and surveillance.  RECOMMENDATIONS FOR FAMILY MEMBERS:  Relatives in this family might be at some increased risk of developing cancer, over the general population risk, simply due to the family history of cancer.  We recommended female relatives in this family have a yearly mammogram beginning at age 66, or 46 years younger than the earliest onset of cancer, an annual clinical breast exam, and perform monthly breast self-exams. Female relatives in this family should also have a gynecological exam as recommended by their primary provider. All family members should have a colonoscopy by age 80, or as directed by their physicians.  FOLLOW-UP: Lastly, we discussed with Leah [name] that cancer genetics is a rapidly advancing field and it is possible that new genetic tests will be appropriate for her and/or her family members in the future. We  encouraged her to remain in contact with cancer genetics on an annual basis so we can update her personal and family histories and let her know of advances in cancer genetics that may benefit this family.   Our contact number was provided. Leah [name] questions were answered to her satisfaction, and she knows she is welcome to call us at anytime with additional questions or concerns.   Faith Rogue, MS, [facility] Genetic Counselor [email or web address] Phone: [phone]

## 2019-01-31 ENCOUNTER — Other Ambulatory Visit: Payer: Self-pay | Admitting: *Deleted

## 2019-02-04 ENCOUNTER — Inpatient Hospital Stay: Payer: Medicaid Other

## 2019-02-05 ENCOUNTER — Encounter: Payer: Self-pay | Admitting: Oncology

## 2019-02-11 ENCOUNTER — Encounter: Payer: Self-pay | Admitting: *Deleted

## 2019-02-11 ENCOUNTER — Other Ambulatory Visit: Admission: RE | Admit: 2019-02-11 | Payer: Medicaid Other | Source: Ambulatory Visit

## 2019-02-12 ENCOUNTER — Encounter: Payer: Self-pay | Admitting: Oncology

## 2019-02-12 ENCOUNTER — Encounter
Admission: RE | Admit: 2019-02-12 | Discharge: 2019-02-12 | Disposition: A | Discharge status: Home / Self Care | Payer: Medicaid Other | Source: Ambulatory Visit | Attending: General Surgery | Admitting: General Surgery

## 2019-02-12 ENCOUNTER — Other Ambulatory Visit: Payer: Self-pay

## 2019-02-12 ENCOUNTER — Other Ambulatory Visit: Payer: Medicaid Other

## 2019-02-12 DIAGNOSIS — Z20828 Contact with and (suspected) exposure to other viral communicable diseases: Secondary | ICD-10-CM | POA: Diagnosis not present

## 2019-02-12 DIAGNOSIS — Z01812 Encounter for preprocedural laboratory examination: Secondary | ICD-10-CM | POA: Diagnosis present

## 2019-02-12 LAB — SARS CORONAVIRUS 2 (TAT 6-24 HRS): SARS Coronavirus 2: NEGATIVE

## 2019-02-12 NOTE — Patient Instructions (Signed)
Your procedure is scheduled on: Friday 12/4 Report to Day Surgery. To find out your arrival time please call [phone] between 1PM - 3PM on Thurs 12/3.  Remember: Instructions that are not followed completely may result in serious medical risk,  up to and including death, or upon the discretion of your surgeon and anesthesiologist your  surgery may need to be rescheduled.     _X__ 1. Do not eat food after midnight the night before your procedure.                 No gum chewing or hard candies. You may drink clear liquids up to 2 hours                 before you are scheduled to arrive for your surgery- DO not drink clear                 liquids within 2 hours of the start of your surgery.                 Clear Liquids include:  water, apple juice without pulp, clear carbohydrate                 drink such as Clearfast of Gatorade, Black Coffee or Tea (Do not add                 anything to coffee or tea).  __X__2.  On the morning of surgery brush your teeth with toothpaste and water, you                may rinse your mouth with mouthwash if you wish.  Do not swallow any toothpaste of mouthwash.     _X__ 3.  No Alcohol for 24 hours before or after surgery.   ___ 4.  Do Not Smoke or use e-cigarettes For 24 Hours Prior to Your Surgery.                 Do not use any chewable tobacco products for at least 6 hours prior to                 surgery.  ____  5.  Bring all medications with you on the day of surgery if instructed.   __x__  6.  Notify your doctor if there is any change in your medical condition      (cold, fever, infections).     Do not wear jewelry, make-up, hairpins, clips or nail polish. Do not wear lotions, powders, or perfumes. You may wear deodorant. Do not shave 48 hours prior to surgery. Men may shave face and neck. Do not bring valuables to the hospital.    [facility] is not responsible for any belongings or valuables.  Contacts,  dentures or bridgework may not be worn into surgery. Leave your suitcase in the car. After surgery it may be brought to your room. For patients admitted to the hospital, discharge time is determined by your treatment team.   Patients discharged the day of surgery will not be allowed to drive home.   Please read over the following fact sheets that you were given:    __x__ Take these medicines the morning of surgery with A SIP OF WATER:    1. methocarbamol (ROBAXIN) 750 MG tablet if needed  2. omeprazole (PRILOSEC) 40 MG capsule  Dose the night before and morning of surgery  3. traMADol (ULTRAM) 50 MG tablet if needed  4.  5.  6.  ____ Fleet Enema (as directed)   ___x_ Use CHG Soap as directed  ____ Use inhalers on the day of surgery  ____ Stop metformin 2 days prior to surgery    ____ Take 1/2 of usual insulin dose the night before surgery. No insulin the morning          of surgery.   ____ Stop Coumadin/Plavix/aspirin   _x___ Stop Anti-inflammatories on today meloxicam (MOBIC) 15 MG tablet   ____ Stop supplements until after surgery.    ____ Bring C-Pap to the hospital.

## 2019-02-13 ENCOUNTER — Other Ambulatory Visit: Payer: Self-pay

## 2019-02-14 ENCOUNTER — Other Ambulatory Visit: Payer: Self-pay

## 2019-02-14 ENCOUNTER — Ambulatory Visit
Admission: RE | Admit: 2019-02-14 | Discharge: 2019-02-14 | Disposition: A | Discharge status: Home / Self Care | Payer: Medicaid Other | Source: Ambulatory Visit | Attending: Radiation Oncology | Admitting: Radiation Oncology

## 2019-02-14 ENCOUNTER — Inpatient Hospital Stay: Payer: Medicaid Other | Attending: Oncology | Admitting: Oncology

## 2019-02-14 ENCOUNTER — Other Ambulatory Visit: Payer: Self-pay | Admitting: *Deleted

## 2019-02-14 ENCOUNTER — Encounter: Payer: Self-pay | Admitting: Oncology

## 2019-02-14 VITALS — BP 164/84 | HR 80 | Temp 98.3°F | Wt 169.0 lb

## 2019-02-14 DIAGNOSIS — Z7189 Other specified counseling: Secondary | ICD-10-CM | POA: Diagnosis not present

## 2019-02-14 DIAGNOSIS — Z79899 Other long term (current) drug therapy: Secondary | ICD-10-CM | POA: Insufficient documentation

## 2019-02-14 DIAGNOSIS — F419 Anxiety disorder, unspecified: Secondary | ICD-10-CM | POA: Diagnosis not present

## 2019-02-14 DIAGNOSIS — C773 Secondary and unspecified malignant neoplasm of axilla and upper limb lymph nodes: Secondary | ICD-10-CM | POA: Diagnosis not present

## 2019-02-14 DIAGNOSIS — Z87442 Personal history of urinary calculi: Secondary | ICD-10-CM | POA: Diagnosis not present

## 2019-02-14 DIAGNOSIS — Z8349 Family history of other endocrine, nutritional and metabolic diseases: Secondary | ICD-10-CM | POA: Diagnosis not present

## 2019-02-14 DIAGNOSIS — Z803 Family history of malignant neoplasm of breast: Secondary | ICD-10-CM | POA: Insufficient documentation

## 2019-02-14 DIAGNOSIS — Z87891 Personal history of nicotine dependence: Secondary | ICD-10-CM | POA: Diagnosis not present

## 2019-02-14 DIAGNOSIS — E785 Hyperlipidemia, unspecified: Secondary | ICD-10-CM | POA: Diagnosis not present

## 2019-02-14 DIAGNOSIS — Z8041 Family history of malignant neoplasm of ovary: Secondary | ICD-10-CM | POA: Diagnosis not present

## 2019-02-14 DIAGNOSIS — Z170 Estrogen receptor positive status [ER+]: Secondary | ICD-10-CM | POA: Diagnosis not present

## 2019-02-14 DIAGNOSIS — G40909 Epilepsy, unspecified, not intractable, without status epilepticus: Secondary | ICD-10-CM | POA: Diagnosis not present

## 2019-02-14 DIAGNOSIS — J45909 Unspecified asthma, uncomplicated: Secondary | ICD-10-CM | POA: Insufficient documentation

## 2019-02-14 DIAGNOSIS — C50212 Malignant neoplasm of upper-inner quadrant of left female breast: Secondary | ICD-10-CM | POA: Insufficient documentation

## 2019-02-14 DIAGNOSIS — M545 Low back pain: Secondary | ICD-10-CM | POA: Insufficient documentation

## 2019-02-14 NOTE — Consult Note (Signed)
NEW PATIENT EVALUATION  Name: Leah [name]  MRN: [id number]  Date:   02/14/2019     DOB: 05/06/1965   This 53 y.o. 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  REFERRING PHYSICIAN: McLean-Scocuzza, Tracy *  CHIEF COMPLAINT:  Chief Complaint  Patient presents with  . Breast Cancer    Initial consultation    DIAGNOSIS: The encounter diagnosis was Malignant neoplasm of upper-inner quadrant of left breast in female, estrogen receptor positive (HCC).   PREVIOUS INVESTIGATIONS:  Mammogram and ultrasound reviewed Clinical notes reviewed Pathology report reviewed  HPI: Patient is a 53-year-old female who presented with an abnormal mammogram of her left breast.  Mammogram showed a 1 x 1.1 cm suspicious mass at the 10:30 position for which ultrasound-guided core biopsy was recommended.  Biopsy was positive for ER/PR positive invasive mammary carcinoma.  She went on to have a wide local excision using a circumferential para nipple areolar complex incision.  There was 9 mm of invasive mammary carcinoma overall grade 1.  Anterior unifocal margin was positive she is planned for reexcision.  1 of 2 lymph nodes had macro metastatic focus greater than 2 mm of metastatic disease.  No extranodal extension was noted.  The metastatic deposit was 3.5 mm.  Both of those lymph nodes were sentinel nodes.  Patient is planned for reexcision.  She had MammaPrint performed showing tumor to be high risk luminal type B showing a 94.6% benefit of chemotherapy plus hormonal therapy.  Tumor was ER/PR positive HER-2/neu negative.  She is seen today for radiation oncology opinion.  She has been told she will need systemic chemotherapy.  She specifically denies breast tenderness cough or bone pain.  PLANNED TREATMENT REGIMEN: Left whole breast and peripheral  emphatic radiation  PAST MEDICAL HISTORY:  has a past medical history of Anxiety, Asthma, Carpal tunnel syndrome, Colon polyps, Depression, Eosinophilic esophagitis, Esophageal stricture, Family history of breast cancer, Family history of prostate cancer, Herniated disc, cervical, History of kidney stones, Hyperlipidemia, Low back pain, and Migraines.    PAST SURGICAL HISTORY:  Past Surgical History:  Procedure Laterality Date  . BREAST BIOPSY Left 12/25/2018   US BX, PATH PENDING  . BREAST LUMPECTOMY WITH NEEDLE LOCALIZATION AND AXILLARY SENTINEL LYMPH NODE BX Left 01/16/2019   Procedure: LEFT BREAST LUMPECTOMY WITH NEEDLE LOCALIZATION AND SENTINEL LYMPH NODE BIOPSY;  Surgeon: Toth, Paul III, MD;  Location: [facility];  Service: General;  Laterality: Left;  . CESAREAN SECTION    . COLONOSCOPY WITH PROPOFOL N/A 03/27/2017   Procedure: COLONOSCOPY WITH PROPOFOL;  Surgeon: Anna, Kiran, MD;  Location: [facility];  Service: Gastroenterology;  Laterality: N/A;  . ESOPHAGOGASTRODUODENOSCOPY    . ESOPHAGOGASTRODUODENOSCOPY (EGD) WITH PROPOFOL N/A 03/27/2017   Procedure: ESOPHAGOGASTRODUODENOSCOPY (EGD) WITH PROPOFOL WITH DILATION;  Surgeon: Anna, Kiran, MD;  Location: [facility];  Service: Gastroenterology;  Laterality: N/A;  . THROAT SURGERY  2015  . UMBILICAL HERNIA REPAIR  2006    x 2   w mesh per pt    FAMILY HISTORY: family history includes Breast cancer in her maternal grandmother and mother; Cancer in her maternal uncle; Colon polyps in her mother; Prostate cancer in her father; Thyroid disease in her mother.  SOCIAL HISTORY:  reports that she quit smoking about 12 years ago. She has a 20.00 pack-year smoking history. She has never used smokeless tobacco.   She reports current alcohol use of about 2.0 - 3.0 standard drinks of alcohol per week. She reports that she does not use drugs.  ALLERGIES: Hydrocodone-acetaminophen, Lactose intolerance (gi), Penicillins, and Sulfa  antibiotics  MEDICATIONS:  Current Outpatient Medications  Medication Sig Dispense Refill  . acetaminophen (TYLENOL) 500 MG tablet Take 500-1,000 mg by mouth every 6 (six) hours as needed (for pain.).    Marland Kitchen Cholecalciferol (VITAMIN D-3) 125 MCG (5000 UT) TABS Take 5,000 Units by mouth daily.    . Cyanocobalamin (VITAMIN B-12 PO) Take 1 tablet by mouth daily.    . fluticasone (FLONASE) 50 MCG/ACT nasal spray Place 2 sprays into both nostrils daily. Max 2 sprays (Patient taking differently: Place 2 sprays into both nostrils at bedtime. Max 2 sprays) 16 g 12  . gabapentin (NEURONTIN) 300 MG capsule Take 300 mg by mouth at bedtime.    . meclizine (ANTIVERT) 12.5 MG tablet Take 1 tablet (12.5 mg total) by mouth 3 (three) times daily as needed for dizziness. 90 tablet 1  . meloxicam (MOBIC) 15 MG tablet Take 15 mg by mouth daily.    . methocarbamol (ROBAXIN) 750 MG tablet Take 1 tablet (750 mg total) by mouth every 6 (six) hours as needed for muscle spasms. 120 tablet 5  . montelukast (SINGULAIR) 10 MG tablet Take 1 tablet (10 mg total) by mouth at bedtime. 90 tablet 3  . omeprazole (PRILOSEC) 40 MG capsule Take 40 mg by mouth every evening.     Marland Kitchen tiZANidine (ZANAFLEX) 4 MG tablet Take 4 mg by mouth at bedtime.     . traMADol (ULTRAM) 50 MG tablet Take 1-2 tablets (50-100 mg total) by mouth every 6 (six) hours as needed. 20 tablet 0  . traZODone (DESYREL) 50 MG tablet Take 1-1.5 tablets (50-75 mg total) by mouth at bedtime as needed for sleep. (Patient taking differently: Take 50 mg by mouth at bedtime as needed for sleep. ) 135 tablet 4   No current facility-administered medications for this encounter.     ECOG PERFORMANCE STATUS:  0 - Asymptomatic  REVIEW OF SYSTEMS: Patient denies any weight loss, fatigue, weakness, fever, chills or night sweats. Patient denies any loss of vision, blurred vision. Patient denies any ringing  of the ears or hearing loss. No irregular heartbeat. Patient denies  heart murmur or history of fainting. Patient denies any chest pain or pain radiating to her upper extremities. Patient denies any shortness of breath, difficulty breathing at night, cough or hemoptysis. Patient denies any swelling in the lower legs. Patient denies any nausea vomiting, vomiting of blood, or coffee ground material in the vomitus. Patient denies any stomach pain. Patient states has had normal bowel movements no significant constipation or diarrhea. Patient denies any dysuria, hematuria or significant nocturia. Patient denies any problems walking, swelling in the joints or loss of balance. Patient denies any skin changes, loss of hair or loss of weight. Patient denies any excessive worrying or anxiety or significant depression. Patient denies any problems with insomnia. Patient denies excessive thirst, polyuria, polydipsia. Patient denies any swollen glands, patient denies easy bruising or easy bleeding. Patient denies any recent infections, allergies or URI. Patient "s visual fields have not changed significantly in recent time.   PHYSICAL EXAM: There were no vitals taken for this visit. Left breast is wide local excision scar around the nipple areolar complex which is well-healed.  No dominant mass or nodularity is noted in either breast in 2 positions examined.  No axillary or  supraclavicular adenopathy is identified.  Patient has large pendulous breasts.  Well-developed well-nourished patient in NAD. HEENT reveals PERLA, EOMI, discs not visualized.  Oral cavity is clear. No oral mucosal lesions are identified. Neck is clear without evidence of cervical or supraclavicular adenopathy. Lungs are clear to A&P. Cardiac examination is essentially unremarkable with regular rate and rhythm without murmur rub or thrill. Abdomen is benign with no organomegaly or masses noted. Motor sensory and DTR levels are equal and symmetric in the upper and lower extremities. Cranial nerves II through XII are grossly  intact. Proprioception is intact. No peripheral adenopathy or edema is identified. No motor or sensory levels are noted. Crude visual fields are within normal range.  LABORATORY DATA: Pathology report reviewed we will review reexcision pathology when available    RADIOLOGY RESULTS: Mammogram and ultrasound reviewed compatible with above-stated findings   IMPRESSION: Stage Ia invasive mammary carcinoma of the left breast with 1 of 2 lymph nodes having macro metastatic disease in patient needing reexcision as well as systemic chemotherapy in 53-year-old female  PLAN: At this time I will reevaluate the patient after completion of chemotherapy.  Would also review her pathology of her reexcision.  Based on the large size of her breast believe hypofractionated course of treatment would carry significant risk of marked skin reaction.  I would plan on delivering 5040 cGy in 28 fractions.  Would also treat treat her peripheral lymphatics with the same dose.  Also would boost her scar another 1400 cGy using electron beam.  Risks and benefits of treatment including skin reaction fatigue alteration of blood counts possible inclusion of superficial lung and slight chance of lymphedema all were discussed in detail with the patient.  Her daughter was present.  I will reevaluate her after completion of chemotherapy and start treatment planning at that time.  Patient comprehends my treatment plan well.  Patient also will benefit from antiestrogen therapy after completion of radiation.  I would like to take this opportunity to thank you for allowing me to participate in the care of your patient..  , MD         

## 2019-02-15 ENCOUNTER — Ambulatory Visit
Admission: RE | Admit: 2019-02-15 | Discharge: 2019-02-15 | Disposition: A | Discharge status: Home / Self Care | Payer: Medicaid Other | Attending: General Surgery | Admitting: General Surgery

## 2019-02-15 ENCOUNTER — Ambulatory Visit: Payer: Medicaid Other

## 2019-02-15 ENCOUNTER — Encounter: Payer: Self-pay | Admitting: *Deleted

## 2019-02-15 ENCOUNTER — Ambulatory Visit: Payer: Medicaid Other | Admitting: Anesthesiology

## 2019-02-15 ENCOUNTER — Other Ambulatory Visit: Payer: Self-pay

## 2019-02-15 ENCOUNTER — Encounter
Admission: RE | Disposition: A | Discharge status: Home / Self Care | Payer: Self-pay | Source: Home / Self Care | Attending: General Surgery

## 2019-02-15 DIAGNOSIS — Z882 Allergy status to sulfonamides status: Secondary | ICD-10-CM | POA: Diagnosis not present

## 2019-02-15 DIAGNOSIS — C50912 Malignant neoplasm of unspecified site of left female breast: Secondary | ICD-10-CM | POA: Diagnosis not present

## 2019-02-15 DIAGNOSIS — C50212 Malignant neoplasm of upper-inner quadrant of left female breast: Secondary | ICD-10-CM | POA: Diagnosis not present

## 2019-02-15 DIAGNOSIS — J45909 Unspecified asthma, uncomplicated: Secondary | ICD-10-CM | POA: Insufficient documentation

## 2019-02-15 DIAGNOSIS — F329 Major depressive disorder, single episode, unspecified: Secondary | ICD-10-CM | POA: Insufficient documentation

## 2019-02-15 DIAGNOSIS — Z880 Allergy status to penicillin: Secondary | ICD-10-CM | POA: Insufficient documentation

## 2019-02-15 DIAGNOSIS — Z79899 Other long term (current) drug therapy: Secondary | ICD-10-CM | POA: Insufficient documentation

## 2019-02-15 DIAGNOSIS — Z95828 Presence of other vascular implants and grafts: Secondary | ICD-10-CM

## 2019-02-15 DIAGNOSIS — Z87891 Personal history of nicotine dependence: Secondary | ICD-10-CM | POA: Diagnosis not present

## 2019-02-15 DIAGNOSIS — Z170 Estrogen receptor positive status [ER+]: Secondary | ICD-10-CM | POA: Diagnosis not present

## 2019-02-15 DIAGNOSIS — Z452 Encounter for adjustment and management of vascular access device: Secondary | ICD-10-CM | POA: Diagnosis not present

## 2019-02-15 HISTORY — PX: PORTACATH PLACEMENT: SHX2246

## 2019-02-15 HISTORY — PX: RE-EXCISION OF BREAST CANCER,SUPERIOR MARGINS: SHX6047

## 2019-02-15 SURGERY — RE-EXCISION OF BREAST CANCER,SUPERIOR MARGINS
Anesthesia: General | Site: Breast

## 2019-02-15 MED ORDER — MIDAZOLAM HCL 2 MG/2ML IJ SOLN
INTRAMUSCULAR | Status: AC
  Filled 2019-02-15: qty 2

## 2019-02-15 MED ORDER — FENTANYL CITRATE (PF) 100 MCG/2ML IJ SOLN
INTRAMUSCULAR | Status: AC
  Administered 2019-02-15: 25 ug via INTRAVENOUS
  Filled 2019-02-15: qty 2

## 2019-02-15 MED ORDER — FENTANYL CITRATE (PF) 100 MCG/2ML IJ SOLN
INTRAMUSCULAR | Status: DC | PRN
  Administered 2019-02-15 (×3): 25 ug via INTRAVENOUS

## 2019-02-15 MED ORDER — PROCHLORPERAZINE MALEATE 10 MG PO TABS: 10.0000 mg | ORAL_TABLET | Freq: Four times a day (QID) | ORAL | 1 refills | Status: DC | PRN

## 2019-02-15 MED ORDER — FENTANYL CITRATE (PF) 100 MCG/2ML IJ SOLN
25.0000 ug | INTRAMUSCULAR | Status: AC | PRN
  Administered 2019-02-15 (×6): 25 ug via INTRAVENOUS

## 2019-02-15 MED ORDER — DEXAMETHASONE SODIUM PHOSPHATE 10 MG/ML IJ SOLN
INTRAMUSCULAR | Status: DC | PRN
  Administered 2019-02-15: 10 mg via INTRAVENOUS

## 2019-02-15 MED ORDER — DEXAMETHASONE 4 MG PO TABS: ORAL_TABLET | ORAL | 1 refills | Status: DC

## 2019-02-15 MED ORDER — HEPARIN SOD (PORK) LOCK FLUSH 100 UNIT/ML IV SOLN
INTRAVENOUS | Status: AC
  Filled 2019-02-15: qty 5

## 2019-02-15 MED ORDER — FENTANYL CITRATE (PF) 100 MCG/2ML IJ SOLN
INTRAMUSCULAR | Status: AC
  Filled 2019-02-15: qty 2

## 2019-02-15 MED ORDER — GABAPENTIN 300 MG PO CAPS
ORAL_CAPSULE | ORAL | Status: AC
  Administered 2019-02-15: 300 mg via ORAL
  Filled 2019-02-15: qty 1

## 2019-02-15 MED ORDER — MIDAZOLAM HCL 2 MG/2ML IJ SOLN
INTRAMUSCULAR | Status: DC | PRN
  Administered 2019-02-15: 2 mg via INTRAVENOUS

## 2019-02-15 MED ORDER — OXYCODONE HCL 5 MG PO TABS
5.0000 mg | ORAL_TABLET | Freq: Once | ORAL | Status: AC | PRN
  Administered 2019-02-15: 15:00:00 5 mg via ORAL

## 2019-02-15 MED ORDER — EPHEDRINE SULFATE 50 MG/ML IJ SOLN
INTRAMUSCULAR | Status: AC
  Filled 2019-02-15: qty 1

## 2019-02-15 MED ORDER — MEPERIDINE HCL 50 MG/ML IJ SOLN: 6.2500 mg | INTRAMUSCULAR | Status: DC | PRN

## 2019-02-15 MED ORDER — VANCOMYCIN HCL IN DEXTROSE 1-5 GM/200ML-% IV SOLN
1000.0000 mg | INTRAVENOUS | Status: AC
  Administered 2019-02-15: 11:00:00 1000 mg via INTRAVENOUS

## 2019-02-15 MED ORDER — PHENYLEPHRINE HCL (PRESSORS) 10 MG/ML IV SOLN
INTRAVENOUS | Status: DC | PRN
  Administered 2019-02-15 (×2): 100 ug via INTRAVENOUS
  Administered 2019-02-15: 200 ug via INTRAVENOUS
  Administered 2019-02-15 (×2): 100 ug via INTRAVENOUS
  Administered 2019-02-15: 200 ug via INTRAVENOUS
  Administered 2019-02-15 (×2): 100 ug via INTRAVENOUS

## 2019-02-15 MED ORDER — BUPIVACAINE HCL (PF) 0.25 % IJ SOLN
INTRAMUSCULAR | Status: DC | PRN
  Administered 2019-02-15: 10 mL
  Administered 2019-02-15: 7 mL

## 2019-02-15 MED ORDER — ONDANSETRON HCL 8 MG PO TABS: 8.0000 mg | ORAL_TABLET | Freq: Two times a day (BID) | ORAL | 1 refills | Status: DC | PRN

## 2019-02-15 MED ORDER — VASOPRESSIN 20 UNIT/ML IV SOLN
INTRAVENOUS | Status: DC | PRN
  Administered 2019-02-15: .5 [IU] via INTRAVENOUS

## 2019-02-15 MED ORDER — LACTATED RINGERS IV SOLN
INTRAVENOUS | Status: DC
  Administered 2019-02-15: 10:00:00 via INTRAVENOUS

## 2019-02-15 MED ORDER — LIDOCAINE HCL (CARDIAC) PF 100 MG/5ML IV SOSY
PREFILLED_SYRINGE | INTRAVENOUS | Status: DC | PRN
  Administered 2019-02-15: 80 mg via INTRAVENOUS

## 2019-02-15 MED ORDER — GABAPENTIN 300 MG PO CAPS
300.0000 mg | ORAL_CAPSULE | ORAL | Status: AC
  Administered 2019-02-15: 11:00:00 300 mg via ORAL

## 2019-02-15 MED ORDER — EPHEDRINE SULFATE 50 MG/ML IJ SOLN
INTRAMUSCULAR | Status: DC | PRN
  Administered 2019-02-15 (×2): 5 mg via INTRAVENOUS
  Administered 2019-02-15: 10 mg via INTRAVENOUS
  Administered 2019-02-15: 5 mg via INTRAVENOUS

## 2019-02-15 MED ORDER — HEPARIN SOD (PORK) LOCK FLUSH 100 UNIT/ML IV SOLN
INTRAVENOUS | Status: DC | PRN
  Administered 2019-02-15: 500 [IU]

## 2019-02-15 MED ORDER — LIDOCAINE-PRILOCAINE 2.5-2.5 % EX CREA: TOPICAL_CREAM | CUTANEOUS | 3 refills | Status: DC

## 2019-02-15 MED ORDER — HEPARIN SODIUM (PORCINE) 1000 UNIT/ML IJ SOLN
INTRAMUSCULAR | Status: AC
  Filled 2019-02-15: qty 1

## 2019-02-15 MED ORDER — PROPOFOL 10 MG/ML IV BOLUS
INTRAVENOUS | Status: DC | PRN
  Administered 2019-02-15 (×2): 10 mg via INTRAVENOUS
  Administered 2019-02-15: 150 mg via INTRAVENOUS
  Administered 2019-02-15: 20 mg via INTRAVENOUS

## 2019-02-15 MED ORDER — OXYCODONE HCL 5 MG/5ML PO SOLN: 5.0000 mg | Freq: Once | ORAL | Status: AC | PRN

## 2019-02-15 MED ORDER — CHLORHEXIDINE GLUCONATE CLOTH 2 % EX PADS: 6.0000 | MEDICATED_PAD | Freq: Once | CUTANEOUS | Status: DC

## 2019-02-15 MED ORDER — ONDANSETRON HCL 4 MG/2ML IJ SOLN
INTRAMUSCULAR | Status: DC | PRN
  Administered 2019-02-15: 10 mg via INTRAVENOUS

## 2019-02-15 MED ORDER — ACETAMINOPHEN 500 MG PO TABS
1000.0000 mg | ORAL_TABLET | ORAL | Status: AC
  Administered 2019-02-15: 11:00:00 1000 mg via ORAL

## 2019-02-15 MED ORDER — PROMETHAZINE HCL 25 MG/ML IJ SOLN: 6.2500 mg | INTRAMUSCULAR | Status: DC | PRN

## 2019-02-15 MED ORDER — BUPIVACAINE HCL (PF) 0.25 % IJ SOLN
INTRAMUSCULAR | Status: AC
  Filled 2019-02-15: qty 30

## 2019-02-15 MED ORDER — HEPARIN SODIUM (PORCINE) 5000 UNIT/ML IJ SOLN
INTRAMUSCULAR | Status: AC
  Filled 2019-02-15: qty 1

## 2019-02-15 MED ORDER — TRAMADOL HCL 50 MG PO TABS: 50.0000 mg | ORAL_TABLET | Freq: Four times a day (QID) | ORAL | 0 refills | Status: DC | PRN

## 2019-02-15 MED ORDER — GLYCOPYRROLATE 0.2 MG/ML IJ SOLN
INTRAMUSCULAR | Status: DC | PRN
  Administered 2019-02-15: .2 mg via INTRAVENOUS

## 2019-02-15 MED ORDER — ACETAMINOPHEN 500 MG PO TABS
ORAL_TABLET | ORAL | Status: AC
  Administered 2019-02-15: 11:00:00 1000 mg via ORAL
  Filled 2019-02-15: qty 2

## 2019-02-15 MED ORDER — PROPOFOL 10 MG/ML IV BOLUS
INTRAVENOUS | Status: AC
  Filled 2019-02-15: qty 20

## 2019-02-15 MED ORDER — VANCOMYCIN HCL IN DEXTROSE 1-5 GM/200ML-% IV SOLN
INTRAVENOUS | Status: AC
  Administered 2019-02-15: 1000 mg via INTRAVENOUS
  Filled 2019-02-15: qty 200

## 2019-02-15 MED ORDER — SODIUM CHLORIDE 0.9 % IV SOLN
INTRAVENOUS | Status: DC | PRN
  Administered 2019-02-15: 5 mL

## 2019-02-15 MED ORDER — OXYCODONE HCL 5 MG PO TABS
ORAL_TABLET | ORAL | Status: AC
  Administered 2019-02-15: 15:00:00 5 mg via ORAL
  Filled 2019-02-15: qty 1

## 2019-02-15 SURGICAL SUPPLY — 48 items
BAG DECANTER FOR FLEXI CONT (MISCELLANEOUS) ×4 IMPLANT
BINDER BREAST LRG (GAUZE/BANDAGES/DRESSINGS) IMPLANT
BINDER BREAST XLRG (GAUZE/BANDAGES/DRESSINGS) IMPLANT
BLADE SURG 15 STRL LF DISP TIS (BLADE) ×2 IMPLANT
BLADE SURG 15 STRL SS (BLADE) ×2
CANISTER SUCT 1200ML W/VALVE (MISCELLANEOUS) ×4 IMPLANT
CHLORAPREP W/TINT 26 (MISCELLANEOUS) ×4 IMPLANT
COVER LIGHT HANDLE STERIS (MISCELLANEOUS) ×8 IMPLANT
COVER WAND RF STERILE (DRAPES) ×4 IMPLANT
DECANTER SPIKE VIAL GLASS SM (MISCELLANEOUS) ×8 IMPLANT
DERMABOND ADVANCED (GAUZE/BANDAGES/DRESSINGS) ×4
DERMABOND ADVANCED .7 DNX12 (GAUZE/BANDAGES/DRESSINGS) ×4 IMPLANT
DRAPE C-ARM XRAY 36X54 (DRAPES) ×4 IMPLANT
DRAPE CHEST BREAST 77X106 FENE (MISCELLANEOUS) ×8 IMPLANT
DRAPE LAPAROTOMY 77X122 PED (DRAPES) ×4 IMPLANT
DRAPE UTILITY 15X26 TOWEL STRL (DRAPES) ×8 IMPLANT
DRAPE UTILITY XL STRL (DRAPES) ×8 IMPLANT
ELECT CAUTERY BLADE 6.4 (BLADE) ×4 IMPLANT
ELECT CAUTERY BLADE TIP 2.5 (TIP) ×8
ELECT COATED BLADE 2.86 ST (ELECTRODE) ×4 IMPLANT
ELECT REM PT RETURN 9FT ADLT (ELECTROSURGICAL) ×4
ELECTRODE CAUTERY BLDE TIP 2.5 (TIP) ×4 IMPLANT
ELECTRODE REM PT RTRN 9FT ADLT (ELECTROSURGICAL) ×2 IMPLANT
GLOVE BIO SURGEON STRL SZ7.5 (GLOVE) ×4 IMPLANT
GOWN STRL REUS W/ TWL LRG LVL3 (GOWN DISPOSABLE) ×4 IMPLANT
GOWN STRL REUS W/TWL LRG LVL3 (GOWN DISPOSABLE) ×4
HANDLE YANKAUER SUCT BULB TIP (MISCELLANEOUS) ×4 IMPLANT
KIT PORT POWER 8FR ISP CVUE (Port) ×4 IMPLANT
KIT TURNOVER KIT A (KITS) ×4 IMPLANT
NEEDLE HYPO 22GX1.5 SAFETY (NEEDLE) ×4 IMPLANT
NEEDLE HYPO 25GX1X1/2 BEV (NEEDLE) ×4 IMPLANT
NS IRRIG 1000ML POUR BTL (IV SOLUTION) ×4 IMPLANT
NS IRRIG 500ML POUR BTL (IV SOLUTION) ×4 IMPLANT
PACK BASIN MAJOR ARMC (MISCELLANEOUS) ×4 IMPLANT
PACK BASIN MINOR ARMC (MISCELLANEOUS) ×4 IMPLANT
PACK PORT-A-CATH (MISCELLANEOUS) ×4 IMPLANT
SPONGE LAP 18X18 RF (DISPOSABLE) ×4 IMPLANT
SUT MNCRL AB 4-0 PS2 18 (SUTURE) ×8 IMPLANT
SUT PROLENE 2 0 SH DA (SUTURE) ×4 IMPLANT
SUT VIC AB 3-0 SH 27 (SUTURE) ×2
SUT VIC AB 3-0 SH 27X BRD (SUTURE) ×2 IMPLANT
SUT VIC AB 3-0 SH 8-18 (SUTURE) ×8 IMPLANT
SYR 10ML LL (SYRINGE) ×4 IMPLANT
SYR 10ML SLIP (SYRINGE) ×4 IMPLANT
SYR BULB 3OZ (MISCELLANEOUS) ×4 IMPLANT
TOWEL OR 17X26 4PK STRL BLUE (TOWEL DISPOSABLE) ×4 IMPLANT
TUBE CONNECTING 12'X1/4 (SUCTIONS) ×1
TUBE CONNECTING 12X1/4 (SUCTIONS) ×3 IMPLANT

## 2019-02-15 NOTE — Anesthesia Procedure Notes (Signed)
Procedure Name: LMA Insertion Date/Time: 02/15/2019 11:20 AM Performed by: Allean Found, CRNA Pre-anesthesia Checklist: Patient identified, Patient being monitored, Timeout performed, Emergency Drugs available and Suction available Patient Re-evaluated:Patient Re-evaluated prior to induction Oxygen Delivery Method: Circle system utilized Preoxygenation: Pre-oxygenation with 100% oxygen Induction Type: IV induction Ventilation: Mask ventilation without difficulty LMA: LMA inserted LMA Size: 4.0 Tube type: Oral Number of attempts: 1 Placement Confirmation: positive ETCO2 and breath sounds checked- equal and bilateral Tube secured with: Tape Dental Injury: Teeth and Oropharynx as per pre-operative assessment

## 2019-02-15 NOTE — Op Note (Signed)
02/15/2019  12:54 PM  PATIENT:  Leah [name]  53 y.o. female  PRE-OPERATIVE DIAGNOSIS:  LFT BREAST CANCER  POST-OPERATIVE DIAGNOSIS:  LEFT BREAST CANCER  PROCEDURE:  Procedure(s): RE-EXCISION OF LEFT BREAST CANCER ANTERIOR MARGINS (Left) INSERTION PORT-A-CATH WITH ULTRASOUND (N/A)  SURGEON:  Surgeon(s) and Role:    * Jovita Kussmaul, MD - Primary  PHYSICIAN ASSISTANT:   ASSISTANTS: none   ANESTHESIA:   local and general  EBL:  minimal   BLOOD ADMINISTERED:none  DRAINS: none   LOCAL MEDICATIONS USED:  MARCAINE     SPECIMEN:  Source of Specimen:  left breast anterior margin  DISPOSITION OF SPECIMEN:  PATHOLOGY  COUNTS:  YES  TOURNIQUET:  * No tourniquets in log *  DICTATION: .Dragon Dictation   After informed consent was obtained the patient was brought to the operating room and placed in the supine position on the operating table.  After adequate induction of general anesthesia a roll was placed between the patient's shoulder blades to extend the shoulder slightly.  The right chest and neck area were then prepped with ChloraPrep, allowed to dry, and draped in usual sterile manner.  An appropriate timeout was performed.  The patient was placed in Trendelenburg position.  The area lateral to the bend of the clavicle on the right chest wall was infiltrated with quarter percent Marcaine.  A large bore needle from the Port-A-Cath kit was used to slide beneath the bend of the clavicle heading towards the sternal notch and in doing so I was able to access the right subclavian vein.  A wire was fed through the needle using the Seldinger technique.  The wire would not feed down to the heart despite multiple tries.  At this point rather than risk any injury we decided to move to the internal jugular vein.  The ultrasound was used to identify the internal jugular vein and I was able to access that vein using ultrasound guidance with the large bore needle from the Port-A-Cath kit.   The wire was then fed through the needle using the Seldinger technique without difficulty.  The wire was confirmed in the central venous system using real-time fluoroscopy.  Next a small incision was made on the right chest wall for the reservoir of the port.  A subcutaneous pocket was created inferior to the incision by blunt finger dissection.  Next a small incision in the skin was made at the wire entry site of the neck.  A hemostat was used to connect the 2 incisions by blunt subcutaneous dissection.  I was then able to pass a tunneling device between the 2 incisions and bring the port tubing through the tunnel.  The tubing was placed on the reservoir.  The reservoir was placed in the pocket and the length of the tubing was estimated using real-time fluoroscopy.  The tubing was cut to the appropriate length.  Next a sheath and dilator were fed over the wire using the Seldinger technique without difficulty.  The dilator and wire were removed from the patient.  The tubing was fed through the sheath as far as it would go and then held in place while the sheath was gently cracked and separated.  Another real-time fluoroscopy image showed the tip of the catheter to be in the distal superior vena cava.  The tubing was then permanently anchored to the reservoir.  The reservoir was anchored in the pocket with two 2-0 Prolene stitches.  Next the port was aspirated and it aspirated  blood easily.  The port was then flushed initially with a dilute heparin solution and then with a more concentrated heparin solution.  The incision at the neck was closed with an interrupted 4-0 Monocryl subcuticular stitch.  The subcutaneous tissue was closed over the port with interrupted 3-0 Vicryl stitches.  The skin was then closed with a running 4-0 Monocryl subcuticular stitch.  Dermabond dressings were applied.  The patient tolerated this portion of the procedure well.  All needle sponge and instrument counts were correct.  The drapes  were removed and the arms were placed out on arm boards.  Next the left breast was prepped with ChloraPrep, allowed to dry, and draped in usual sterile manner.  The area overlying the lumpectomy cavity was identified.  An elliptical incision was made in the skin overlying the cavity with a 15 blade knife.  The incision was then carried through the skin and subcutaneous tissue sharply with the electrocautery until the lumpectomy cavity was identified.  The entire anterior margin to the skin was removed without difficulty.  It was marked with a short stitch on the superior surface and a long stitch on the lateral surface and sent to pathology for further evaluation.  Hemostasis was achieved using the Bovie electrocautery.  The area around this was infiltrated with quarter percent Marcaine.  The deep layer of the wound was then closed with layers of interrupted 3-0 Vicryl stitches.  The skin was closed with a running 4-0 Monocryl subcuticular stitch.  Dermabond dressings were applied.  The patient tolerated the procedure well.  At the end of the case all needle sponge and instrument counts were correct.  The patient was then awakened and taken to recovery in stable condition.  PLAN OF CARE: Discharge to home after PACU  PATIENT DISPOSITION:  PACU - hemodynamically stable.   Delay start of Pharmacological VTE agent (>24hrs) due to surgical blood loss or risk of bleeding: not applicable

## 2019-02-15 NOTE — Anesthesia Postprocedure Evaluation (Signed)
Anesthesia Post Note  Patient: Leah [name]  Procedure(s) Performed: RE-EXCISION OF LEFT BREAST CANCER ANTERIOR MARGINS (Left Breast) INSERTION PORT-A-CATH WITH POSSIBLE ULTRASOUND (N/A )  Patient location during evaluation: PACU Anesthesia Type: General Level of consciousness: awake and alert and oriented Pain management: pain level controlled Vital Signs Assessment: post-procedure vital signs reviewed and stable Respiratory status: spontaneous breathing, nonlabored ventilation and respiratory function stable Cardiovascular status: blood pressure returned to baseline and stable Postop Assessment: no signs of nausea or vomiting Anesthetic complications: no     Last Vitals:  Vitals:   02/15/19 1456 02/15/19 1506  BP: (!) 132/105 124/65  Pulse: 68 66  Resp: 16 18  Temp: (!) 36.1 C (!) 36.3 C  SpO2: 100% 100%    Last Pain:  Vitals:   02/15/19 1506  TempSrc: Temporal  PainSc: 5                  Railee Bonillas

## 2019-02-15 NOTE — Anesthesia Preprocedure Evaluation (Signed)
Anesthesia Evaluation  Patient identified by MRN, date of birth, ID band Patient awake    Reviewed: Allergy & Precautions, NPO status , Patient's Chart, lab work & pertinent test results  History of Anesthesia Complications Negative for: history of anesthetic complications  Airway Mallampati: II  TM Distance: >3 FB Neck ROM: Full    Dental  (+) Missing   Pulmonary asthma , former smoker,    breath sounds clear to auscultation- rhonchi (-) wheezing      Cardiovascular Exercise Tolerance: Good (-) hypertension(-) CAD, (-) Past MI, (-) Cardiac Stents and (-) CABG  Rhythm:Regular Rate:Normal - Systolic murmurs and - Diastolic murmurs    Neuro/Psych  Headaches, neg Seizures PSYCHIATRIC DISORDERS Anxiety Depression    GI/Hepatic negative GI ROS, Neg liver ROS,   Endo/Other  negative endocrine ROSneg diabetes  Renal/GU negative Renal ROS     Musculoskeletal negative musculoskeletal ROS (+)   Abdominal (+) + obese,   Peds  Hematology negative hematology ROS (+)   Anesthesia Other Findings Past Medical History: No date: Anxiety No date: Asthma     Comment:  as a child No date: Carpal tunnel syndrome     Comment:  R hand  No date: Colon polyps No date: Depression No date: Eosinophilic esophagitis     Comment:  noted in [place] with GI path report 09/29/14  No date: Esophageal stricture     Comment:  s/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               Edwin Cap  No date: Family history of breast cancer No date: Family history of prostate cancer No date: Herniated disc, cervical     Comment:  s/p epidural injection No date: History of kidney stones No date: Hyperlipidemia No date: Low back pain No date: Migraines   Reproductive/Obstetrics                             Anesthesia Physical Anesthesia Plan  ASA: II  Anesthesia Plan: General   Post-op Pain Management:     Induction: Intravenous  PONV Risk Score and Plan: 2 and Ondansetron, Dexamethasone and Midazolam  Airway Management Planned: LMA  Additional Equipment:   Intra-op Plan:   Post-operative Plan:   Informed Consent: I have reviewed the patients History and Physical, chart, labs and discussed the procedure including the risks, benefits and alternatives for the proposed anesthesia with the patient or authorized representative who has indicated his/her understanding and acceptance.     Dental advisory given  Plan Discussed with: CRNA and Anesthesiologist  Anesthesia Plan Comments:         Anesthesia Quick Evaluation

## 2019-02-15 NOTE — Anesthesia Post-op Follow-up Note (Signed)
Anesthesia QCDR form completed.        

## 2019-02-15 NOTE — H&P (Signed)
Leah [name]  Location: [facility] Patient #: [id number] DOB: [birth year] Single / Language: Leah [name] / Race: White Female   History of Present Illness  The patient is a 53 year old female who presents for a follow-up for Breast cancer. The patient is a 53 year old white female who is about 2 weeks status post left breast lumpectomy and sentinel node biopsy for a T1b N1 A left breast cancer that was ER and PR positive and HER-2 negative. She tolerated the surgery well. She did have 1 of 2 lymph nodes that was positive and her anterior margin at the skin was positive. She denies any significant breast pain.   Allergies  Penicillin G Procaine *PENICILLINS*  Sulfa 10 *OPHTHALMIC AGENTS*  Allergies Reconciled   Medication History  Fluticasone Propionate (50MCG/ACT Suspension, Nasal) Active. Gabapentin (300MG Capsule, Oral) Active. Ibuprofen (800MG Tablet, Oral) Active. Meclizine HCl (12.5MG Tablet, Oral) Active. Methocarbamol (750MG Tablet, Oral) Active. Montelukast Sodium (10MG Tablet, Oral) Active. Omeprazole (40MG Capsule DR, Oral) Active. tiZANidine HCl (4MG Tablet, Oral) Active. traMADol HCl (50MG Tablet, Oral) Active. traZODone HCl (50MG Tablet, Oral) Active. valACYclovir HCl (1GM Tablet, Oral) Active. Tylenol Extra Strength (500MG Tablet, Oral) Active. ZyrTEC Allergy (10MG Tablet, Oral) Active. Medications Reconciled    Review of Systems  General Not Present- Appetite Loss, Chills, Fatigue, Fever, Night Sweats, Weight Gain and Weight Loss. Skin Not Present- Change in Wart/Mole, Dryness, Hives, Jaundice, New Lesions, Non-Healing Wounds, Rash and Ulcer. HEENT Present- Seasonal Allergies and Wears glasses/contact lenses. Not Present- Earache, Hearing Loss, Hoarseness, Nose Bleed, Oral Ulcers, Ringing in the Ears, Sinus Pain, Sore Throat, Visual Disturbances and Yellow Eyes. Respiratory Not Present- Bloody sputum, Chronic Cough, Difficulty  Breathing, Snoring and Wheezing. Breast Present- Breast Mass. Not Present- Breast Pain, Nipple Discharge and Skin Changes. Cardiovascular Not Present- Chest Pain, Difficulty Breathing Lying Down, Leg Cramps, Palpitations, Rapid Heart Rate, Shortness of Breath and Swelling of Extremities. Gastrointestinal Not Present- Abdominal Pain, Bloating, Bloody Stool, Change in Bowel Habits, Chronic diarrhea, Constipation, Difficulty Swallowing, Excessive gas, Gets full quickly at meals, Hemorrhoids, Indigestion, Nausea, Rectal Pain and Vomiting. Female Genitourinary Not Present- Frequency, Nocturia, Painful Urination, Pelvic Pain and Urgency. Musculoskeletal Present- Back Pain and Joint Stiffness. Not Present- Joint Pain, Muscle Pain, Muscle Weakness and Swelling of Extremities. Neurological Present- Numbness and Tingling. Not Present- Decreased Memory, Fainting, Headaches, Seizures, Tremor, Trouble walking and Weakness. Psychiatric Present- Anxiety and Depression. Not Present- Bipolar, Change in Sleep Pattern, Fearful and Frequent crying. Endocrine Not Present- Cold Intolerance, Excessive Hunger, Hair Changes, Heat Intolerance, Hot flashes and New Diabetes. Hematology Not Present- Blood Thinners, Easy Bruising, Excessive bleeding, Gland problems, HIV and Persistent Infections.  Vitals Weight: 168.8 lb Height: 63in Body Surface Area: 1.8 m Body Mass Index: 29.9 kg/m  Temp.: 98.44F(Tympanic)  Pulse: 98 (Regular)  BP: 110/72 (Sitting, Left Arm, Standard)       Physical Exam  General Mental Status-Alert. General Appearance-Consistent with stated age. Hydration-Well hydrated. Voice-Normal.  Head and Neck Head-normocephalic, atraumatic with no lesions or palpable masses. Trachea-midline. Thyroid Gland Characteristics - normal size and consistency.  Eye Eyeball - Bilateral-Extraocular movements intact. Sclera/Conjunctiva - Bilateral-No scleral icterus.  Chest and  Lung Exam Chest and lung exam reveals -quiet, even and easy respiratory effort with no use of accessory muscles and on auscultation, normal breath sounds, no adventitious sounds and normal vocal resonance. Inspection Chest Wall - Normal. Back - normal.  Breast Note: On the left breast periareolar incision is healing nicely with no sign of infection  or seroma. The area of skin likely involved goes from approximately 2.5 cm to 4 cm from the edge of the incision in the upper inner quadrant.   Cardiovascular Cardiovascular examination reveals -normal heart sounds, regular rate and rhythm with no murmurs and normal pedal pulses bilaterally.  Abdomen Inspection Inspection of the abdomen reveals - No Hernias. Skin - Scar - no surgical scars. Palpation/Percussion Palpation and Percussion of the abdomen reveal - Soft, Non Tender, No Rebound tenderness, No Rigidity (guarding) and No hepatosplenomegaly. Auscultation Auscultation of the abdomen reveals - Bowel sounds normal.  Neurologic Neurologic evaluation reveals -alert and oriented x 3 with no impairment of recent or remote memory. Mental Status-Normal.  Musculoskeletal Normal Exam - Left-Upper Extremity Strength Normal and Lower Extremity Strength Normal. Normal Exam - Right-Upper Extremity Strength Normal and Lower Extremity Strength Normal.  Lymphatic Head & Neck  General Head & Neck Lymphatics: Bilateral - Description - Normal. Axillary  General Axillary Region: Bilateral - Description - Normal. Tenderness - Non Tender. Femoral & Inguinal  Generalized Femoral & Inguinal Lymphatics: Bilateral - Description - Normal. Tenderness - Non Tender.    Assessment & Plan  MALIGNANT NEOPLASM OF UPPER-INNER QUADRANT OF LEFT BREAST IN FEMALE, ESTROGEN RECEPTOR POSITIVE (C50.212) Impression: The patient is about 2 weeks status post left breast lumpectomy for breast cancer. She tolerated the surgery well. Unfortunately her  anterior margin was positive which was at the skin. I believe it would be possible to re-excise the skin to make sure that this margin is clean. I have discussed with her in detail the risks and benefits of the operation as well as some of the technical aspects and she understands and wishes to proceed. She will also need a port for chemo. This was also discussed in detail with risks and benefits and she understands and wishes to proceed  Call us sooner as needed.

## 2019-02-15 NOTE — Discharge Instructions (Signed)

## 2019-02-15 NOTE — Interval H&P Note (Signed)
History and Physical Interval Note:  02/15/2019 10:46 AM  Leah [name]  has presented today for surgery, with the diagnosis of LFT BREAST CANCER.  The various methods of treatment have been discussed with the patient and family. After consideration of risks, benefits and other options for treatment, the patient has consented to  Procedure(s): RE-EXCISION OF LEFT BREAST CANCER ANTERIOR MARGINS (Left) INSERTION PORT-A-CATH WITH POSSIBLE ULTRASOUND (N/A) as a surgical intervention.  The patient's history has been reviewed, patient examined, no change in status, stable for surgery.  I have reviewed the patient's chart and labs.  Questions were answered to the patient's satisfaction.     Autumn Messing III

## 2019-02-15 NOTE — Progress Notes (Signed)
Hematology/Oncology Consult note [facility]  Telephone:([phone] [phone]  Patient Care Team: McLean-Scocuzza, Nino Glow, MD as PCP - General (Internal Medicine) Rico Junker, RN as Registered Nurse   Name of the patient: Leah [name]  [id number]  16-Jun-1965   Date of visit: 02/15/19  Diagnosis-pathological prognostic stage Ia invasive mammary carcinoma pT1b pN1 cM0 ER/PR positive HER-2/neu negative s/p lumpectomy  Chief complaint/ Reason for visit-discuss MammaPrint results and further management  Heme/Onc history: Patient is a 53 year old postmenopausal female G2 P2 L2 who recently underwent screening bilateral mammogram which showed area of concern in her left breast. This was followed by an ultrasound and Core biopsy. Ultrasound showed 1 x 1.1 x 0.7 cm hypoechoic mass at the 10:30 position of the left breast. Left axilla appeared normal. 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   Final pathology showed 9 mm grade 1 invasive mammary carcinoma 1 out of 2 lymph nodes involved with macro metastatic carcinoma 3.5 mm.  Anterior margin was focally positive for which patient will be undergoing reexcision.  PT1BPN1a (sn)  MammaPrint score came back as high risk with a risk of recurrence of 29% at 10 years without chemotherapy  Interval history-patient is doing well and healing well from her lumpectomy site.  Denies any complaints at this time  ECOG PS- 0 Pain scale- 0   Review of systems- Review of Systems  Constitutional: Negative for chills, fever, malaise/fatigue and weight loss.  HENT: Negative for congestion, ear discharge and nosebleeds.   Eyes: Negative for blurred vision.  Respiratory: Negative for cough, hemoptysis, sputum production, shortness of breath and wheezing.   Cardiovascular: Negative for chest pain, palpitations, orthopnea and claudication.   Gastrointestinal: Negative for abdominal pain, blood in stool, constipation, diarrhea, heartburn, melena, nausea and vomiting.  Genitourinary: Negative for dysuria, flank pain, frequency, hematuria and urgency.  Musculoskeletal: Negative for back pain, joint pain and myalgias.  Skin: Negative for rash.  Neurological: Negative for dizziness, tingling, focal weakness, seizures, weakness and headaches.  Endo/Heme/Allergies: Does not bruise/bleed easily.  Psychiatric/Behavioral: Negative for depression and suicidal ideas. The patient does not have insomnia.       Allergies  Allergen Reactions  . Hydrocodone-Acetaminophen Hives    All over her body.  . Lactose Intolerance (Gi) Other (See Comments)    Bloating and GI upset  . Penicillins Rash    Did it involve swelling of the face/tongue/throat, SOB, or low BP? Unknown Did it involve sudden or severe rash/hives, skin peeling, or any reaction on the inside of your mouth or nose? Yes Did you need to seek medical attention at a hospital or doctor's office? Unknown When did it last happen? Childhood reaction. If all above answers are "NO", may proceed with cephalosporin use.   . Sulfa Antibiotics Rash    Full body rash      Past Medical History:  Diagnosis Date  . Anxiety   . Asthma    as a child  . Carpal tunnel syndrome    R hand   . Colon polyps   . Depression   . Eosinophilic esophagitis    noted in [place] with GI path report 09/29/14   . Esophageal stricture    s/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 Edwin Cap   . Family history of breast cancer   . Family history of prostate cancer   . Herniated disc, cervical  s/p epidural injection  . History of kidney stones   . Hyperlipidemia   . Low back pain   . Migraines      Past Surgical History:  Procedure Laterality Date  . BREAST BIOPSY Left 12/25/2018   [place] BX, PATH PENDING  . BREAST LUMPECTOMY WITH NEEDLE LOCALIZATION AND AXILLARY SENTINEL  LYMPH NODE BX Left 01/16/2019   Procedure: LEFT BREAST LUMPECTOMY WITH NEEDLE LOCALIZATION AND SENTINEL LYMPH NODE BIOPSY;  Surgeon: Jovita Kussmaul, MD;  Location: [facility] ORS;  Service: General;  Laterality: Left;  . CESAREAN SECTION    . COLONOSCOPY WITH PROPOFOL N/A 03/27/2017   Procedure: COLONOSCOPY WITH PROPOFOL;  Surgeon: Jonathon Bellows, MD;  Location: [facility] ENDOSCOPY;  Service: Gastroenterology;  Laterality: N/A;  . ESOPHAGOGASTRODUODENOSCOPY    . ESOPHAGOGASTRODUODENOSCOPY (EGD) WITH PROPOFOL N/A 03/27/2017   Procedure: ESOPHAGOGASTRODUODENOSCOPY (EGD) WITH PROPOFOL WITH DILATION;  Surgeon: Jonathon Bellows, MD;  Location: [facility] ENDOSCOPY;  Service: Gastroenterology;  Laterality: N/A;  . THROAT SURGERY  2015  . UMBILICAL HERNIA REPAIR  2006    x 2   w mesh per pt    Social History   Socioeconomic History  . Marital status: Single    Spouse name: Not on file  . Number of children: Not on file  . Years of education: Not on file  . Highest education level: Not on file  Occupational History  . Not on file  Social Needs  . Financial resource strain: Not on file  . Food insecurity    Worry: Not on file    Inability: Not on file  . Transportation needs    Medical: Not on file    Non-medical: Not on file  Tobacco Use  . Smoking status: Former Smoker    Packs/day: 1.00    Years: 20.00    Pack years: 20.00    Quit date: 06/02/2006    Years since quitting: 12.7  . Smokeless tobacco: Never Used  Substance and Sexual Activity  . Alcohol use: Yes    Alcohol/week: 2.0 - 3.0 standard drinks    Types: 2 - 3 Standard drinks or equivalent per week    Comment: socially  . Drug use: No  . Sexual activity: Yes    Partners: Male  Lifestyle  . Physical activity    Days per week: Not on file    Minutes per session: Not on file  . Stress: Not on file  Relationships  . Social Herbalist on phone: Not on file    Gets together: Not on file    Attends religious service: Not on file     Active member of club or organization: Not on file    Attends meetings of clubs or organizations: Not on file    Relationship status: Not on file  . Intimate partner violence    Fear of current or ex partner: Not on file    Emotionally abused: Not on file    Physically abused: Not on file    Forced sexual activity: Not on file  Other Topics Concern  . 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 As of 02/26/17    Divorced.    Former smoker 20+ years quit in 1997 then started again for 5 years and quit 10-12 years ago as of 03/08/17  Family History  Problem Relation Age of Onset  . Breast cancer Mother        dx late 76s  . Thyroid disease Mother   . Colon polyps Mother   . Prostate cancer Father        dx 21  . Breast cancer Maternal Grandmother        dx late 24s  . Cancer Maternal Uncle        possibly, unsure type    No current facility-administered medications for this visit.  No current outpatient medications on file.  Facility-Administered Medications Ordered in Other Visits:  .  acetaminophen (TYLENOL) 500 MG tablet, , , ,  .  acetaminophen (TYLENOL) tablet 1,000 mg, 1,000 mg, Oral, On Call to OR, Autumn Messing III, MD .  6 CHG cloth bath night before surgery, , , Once **AND** [START ON 02/16/2019] 6 CHG cloth bath AM of surgery, , , Once **AND** Chlorhexidine Gluconate Cloth 2 % PADS 6 each, 6 each, Topical, Once **AND** Chlorhexidine Gluconate Cloth 2 % PADS 6 each, 6 each, Topical, Once, Autumn Messing III, MD .  gabapentin (NEURONTIN) 300 MG capsule, , , ,  .  gabapentin (NEURONTIN) capsule 300 mg, 300 mg, Oral, On Call to OR, Autumn Messing III, MD .  lactated ringers infusion, , Intravenous, Continuous, Kephart, Jamse Mead, MD .  vancomycin (VANCOCIN) 1-5 GM/200ML-% IVPB, , , ,  .  vancomycin (VANCOCIN) IVPB 1000 mg/200 mL premix, 1,000 mg,  Intravenous, On Call to OR, Jovita Kussmaul, MD  Physical exam:  Vitals:   02/14/19 0951  BP: (!) 164/84  Pulse: 80  Temp: 98.3 F (36.8 C)  TempSrc: Tympanic  Weight: 169 lb (76.7 kg)   Physical Exam Constitutional:      General: She is not in acute distress. HENT:     Head: Normocephalic and atraumatic.  Eyes:     Pupils: Pupils are equal, round, and reactive to light.  Neck:     Musculoskeletal: Normal range of motion.  Cardiovascular:     Rate and Rhythm: Normal rate and regular rhythm.     Heart sounds: Normal heart sounds.  Pulmonary:     Effort: Pulmonary effort is normal.     Breath sounds: Normal breath sounds.  Abdominal:     General: Bowel sounds are normal.     Palpations: Abdomen is soft.  Skin:    General: Skin is warm and dry.  Neurological:     Mental Status: She is alert and oriented to person, place, and time.      CMP Latest Ref Rng & Units 10/04/2018  Glucose 70 - 99 mg/dL 99  BUN 6 - 23 mg/dL 17  Creatinine 0.40 - 1.20 mg/dL 0.67  Sodium 135 - 145 mEq/L 138  Potassium 3.5 - 5.1 mEq/L 4.0  Chloride 96 - 112 mEq/L 103  CO2 19 - 32 mEq/L 25  Calcium 8.4 - 10.5 mg/dL 9.7  Total Protein 6.0 - 8.3 g/dL 6.8  Total Bilirubin 0.2 - 1.2 mg/dL 0.5  Alkaline Phos 39 - 117 U/L 42  AST 0 - 37 U/L 14  ALT 0 - 35 U/L 14   CBC Latest Ref Rng & Units 10/04/2018  WBC 4.0 - 10.5 K/uL 6.9  Hemoglobin 12.0 - 15.0 g/dL 16.1(H)  Hematocrit 36.0 - 46.0 % 46.0  Platelets 150.0 - 400.0 K/uL 335.0    No images are attached to the encounter.  Nm Sentinel Node Injection  Result Date: 01/16/2019 CLINICAL  DATA:  Left breast cancer. EXAM: NUCLEAR MEDICINE BREAST LYMPHOSCINTIGRAPHY TECHNIQUE: Intradermal injection of radiopharmaceutical was performed at the 12 o'clock, 3 o'clock, 6 o'clock, and 9 o'clock positions around the left nipple. The patient was then sent to the operating room where the sentinel node(s) were identified and removed by the surgeon.  RADIOPHARMACEUTICALS:  Total of 0.946 mCi Millipore-filtered Technetium-45msulfur colloid, injected in four aliquots of 0.25 mCi each. IMPRESSION: Uncomplicated intradermal injection of a total of 1 mCi Technetium-974mulfur colloid for purposes of sentinel node identification. Electronically Signed   By: HeKathreen Devoid On: 01/16/2019 12:43   Mm Breast Surgical Specimen  Result Date: 01/16/2019 CLINICAL DATA:  Status post lumpectomy today after earlier needle localization. EXAM: SPECIMEN RADIOGRAPH OF THE LEFT BREAST COMPARISON:  Previous exam(s). FINDINGS: Status post excision of the left breast. The wire tip and biopsy marker clip are present and are marked for pathology. IMPRESSION: Specimen radiograph of the left breast. Electronically Signed   By: StFranki Cabot.D.   On: 01/16/2019 16:41   UsKorear Nerve Block-image Only ([facility])  Result Date: 01/16/2019 There is no interpretation for this exam.  This order is for images obtained during a surgical procedure.  Please See "Surgeries" Tab for more information regarding the procedure.   Mm Lt Plc Breast Loc Dev   1st Lesion  Inc Mammo Guide  Result Date: 01/16/2019 CLINICAL DATA:  Patient with a LEFT breast carcinoma scheduled for lumpectomy today requiring preoperative needle localization. EXAM: NEEDLE LOCALIZATION OF THE LEFT BREAST WITH MAMMO GUIDANCE COMPARISON:  Previous exams. FINDINGS: Patient presents for needle localization prior to lumpectomy. I met with the patient and we discussed the procedure of needle localization including benefits and alternatives. We discussed the high likelihood of a successful procedure. We discussed the risks of the procedure, including infection, bleeding, tissue injury, and further surgery. Informed, written consent was given. The usual time-out protocol was performed immediately prior to the procedure. Using mammographic guidance, sterile technique, 1% lidocaine and a 9 cm modified Kopans needle, the coil shaped  clip within the inner LEFT breast was localized using medial approach. The images were marked for Dr. ToMarlou StarksIMPRESSION: Needle localization LEFT breast. No apparent complications. Electronically Signed   By: StFranki Cabot.D.   On: 01/16/2019 11:29     Assessment and plan- Patient is a [age].o. female with prior history of polycythemia now with newly diagnosed invasive mammary carcinoma pathologic prognostic stage Ia of the left breast pT1b pN1 acM0 ER/PR positive HER-2/neu negative.  She is s/p lumpectomy and here to discuss the results of MammaPrint testing and further management  Discussed the results of the MammaPrint testing which came back as high risk with a recurrence of 29% at 10 years without chemotherapy.  Patient is young and fit with no significant comorbidities with node positive disease and high risk MammaPrint score.  I therefore recommend adjuvant chemotherapy with dose dense AC x4 cycles followed by 12 weekly cycles of Taxol. ABC trials showed benefit for anthracycline-based regimen for node positive disease and I would therefore prefer that over a nonanthracycline regimen.  Patient will be getting port placement at the time of her reexcision surgery tomorrow.  She will also need baseline echocardiogram and chemo teach.  Discussed risks and benefits of chemotherapy including all but not limited to nausea, vomiting, low blood counts, hair loss, risk of infections and hospitalization.  Risk of cardiotoxicity associated with anthracycline.  Risk of peripheral neuropathy and infusion reaction  associated with Taxol.  Patient understands and agrees to proceed as planned.  Treatment will be given with a curative intent. Also mentioned about cooling caps to reduce the incidence of hair loss and we will give her information about it at chemo teach.   I will tentatively give her 2 weeks time after her reexcision surgery to allow time to heal before starting chemotherapy.  She will be seen during  the week of Christmas by covering provider to start first cycle of dose dense AC chemotherapy.  Patient will be a candidate for hormone therapy as well as adjuvant radiation treatment upon completion of chemotherapy.  She would also qualify for adjuvant bisphosphonates upon completion of chemotherapy Zometa every 6 months for 3 to 5 years.  I will discuss Zometa and hormone therapy in greater detail after chemotherapy is completed.   Visit Diagnosis 1. Goals of care, counseling/discussion   2. Malignant neoplasm of upper-inner quadrant of left breast in female, estrogen receptor positive ([place])      Dr. Randa Evens, MD, MPH [facility] [phone] 02/15/2019 10:26 AM

## 2019-02-15 NOTE — Progress Notes (Signed)
START ON PATHWAY REGIMEN - Breast     Cycles 1 through 4: A cycle is every 14 days:     Doxorubicin      Cyclophosphamide      [id number]-[id number]    Cycles 5 through 16: A cycle is every 7 days:     Paclitaxel   **Always confirm dose/schedule in your pharmacy ordering system**  Patient Characteristics: Postoperative without Neoadjuvant Therapy (Pathologic Staging), Invasive Disease, Adjuvant Therapy, HER2 Negative/Unknown/Equivocal, ER Positive, Node Positive, Node Positive (1-3), MammaPrint(R) Ordered, High Genomic Risk Therapeutic Status: Postoperative without Neoadjuvant Therapy (Pathologic Staging) AJCC Grade: G1 AJCC N Category: pN1a AJCC M Category: cM0 ER Status: Positive (+) AJCC 8 Stage Grouping: IA HER2 Status: Negative (-) Oncotype Dx Recurrence Score: Ordered Other Genomic Test AJCC T Category: pT1b PR Status: Positive (+) Has this patient completed genomic testing<= Yes - MammaPrint(R) MammaPrint(R) Score: High Genomic Risk Intent of Therapy: Curative Intent, Discussed with Patient

## 2019-02-15 NOTE — Transfer of Care (Signed)
Immediate Anesthesia Transfer of Care Note  Patient: Leah [name]  Procedure(s) Performed: RE-EXCISION OF LEFT BREAST CANCER ANTERIOR MARGINS (Left Breast) INSERTION PORT-A-CATH WITH POSSIBLE ULTRASOUND (N/A )  Patient Location: PACU  Anesthesia Type:General  Level of Consciousness: awake, alert  and oriented  Airway & Oxygen Therapy: Patient Spontanous Breathing and Patient connected to face mask oxygen  Post-op Assessment: Report given to RN and Post -op Vital signs reviewed and stable  Post vital signs: Reviewed and stable  Last Vitals:  Vitals Value Taken Time  BP 130/78 02/15/19 1307  Temp    Pulse 85 02/15/19 1311  Resp 12 02/15/19 1311  SpO2 100 % 02/15/19 1311  Vitals shown include unvalidated device data.  Last Pain:  Vitals:   02/15/19 1012  TempSrc: Temporal  PainSc: 0-No pain         Complications: No apparent anesthesia complications

## 2019-02-18 ENCOUNTER — Encounter: Payer: Self-pay | Admitting: General Surgery

## 2019-02-18 LAB — SURGICAL PATHOLOGY

## 2019-02-19 ENCOUNTER — Other Ambulatory Visit: Payer: Self-pay | Admitting: Internal Medicine

## 2019-02-19 DIAGNOSIS — R110 Nausea: Secondary | ICD-10-CM

## 2019-02-19 DIAGNOSIS — H81399 Other peripheral vertigo, unspecified ear: Secondary | ICD-10-CM

## 2019-02-19 MED ORDER — MECLIZINE HCL 12.5 MG PO TABS: 12.5000 mg | ORAL_TABLET | Freq: Three times a day (TID) | ORAL | 1 refills | Status: DC | PRN

## 2019-02-20 ENCOUNTER — Encounter: Payer: Self-pay | Admitting: *Deleted

## 2019-02-21 ENCOUNTER — Encounter: Payer: Self-pay | Admitting: *Deleted

## 2019-02-22 NOTE — Patient Instructions (Signed)
Doxorubicin injection What is this medicine? DOXORUBICIN (dox oh ROO bi sin) is a chemotherapy drug. It is used to treat many kinds of cancer like leukemia, lymphoma, neuroblastoma, sarcoma, and Wilms' tumor. It is also used to treat bladder cancer, breast cancer, lung cancer, ovarian cancer, stomach cancer, and thyroid cancer. This medicine may be used for other purposes; ask your health care provider or pharmacist if you have questions. COMMON BRAND NAME(S): Adriamycin, Adriamycin PFS, Adriamycin RDF, Rubex What should I tell my health care provider before I take this medicine? They need to know if you have any of these conditions:  heart disease  history of low blood counts caused by a medicine  liver disease  recent or ongoing radiation therapy  an unusual or allergic reaction to doxorubicin, other chemotherapy agents, other medicines, foods, dyes, or preservatives  pregnant or trying to get pregnant  breast-feeding How should I use this medicine? This drug is given as an infusion into a vein. It is administered in a hospital or clinic by a specially trained health care professional. If you have pain, swelling, burning or any unusual feeling around the site of your injection, tell your health care professional right away. Talk to your pediatrician regarding the use of this medicine in children. Special care may be needed. Overdosage: If you think you have taken too much of this medicine contact a poison control center or emergency room at once. NOTE: This medicine is only for you. Do not share this medicine with others. What if I miss a dose? It is important not to miss your dose. Call your doctor or health care professional if you are unable to keep an appointment. What may interact with this medicine? This medicine may interact with the following medications:  6-mercaptopurine  paclitaxel  phenytoin  St. John's Wort  trastuzumab  verapamil This list may not describe  all possible interactions. Give your health care provider a list of all the medicines, herbs, non-prescription drugs, or dietary supplements you use. Also tell them if you smoke, drink alcohol, or use illegal drugs. Some items may interact with your medicine. What should I watch for while using this medicine? This drug may make you feel generally unwell. This is not uncommon, as chemotherapy can affect healthy cells as well as cancer cells. Report any side effects. Continue your course of treatment even though you feel ill unless your doctor tells you to stop. There is a maximum amount of this medicine you should receive throughout your life. The amount depends on the medical condition being treated and your overall health. Your doctor will watch how much of this medicine you receive in your lifetime. Tell your doctor if you have taken this medicine before. You may need blood work done while you are taking this medicine. Your urine may turn red for a few days after your dose. This is not blood. If your urine is dark or brown, call your doctor. In some cases, you may be given additional medicines to help with side effects. Follow all directions for their use. Call your doctor or health care professional for advice if you get a fever, chills or sore throat, or other symptoms of a cold or flu. Do not treat yourself. This drug decreases your body's ability to fight infections. Try to avoid being around people who are sick. This medicine may increase your risk to bruise or bleed. Call your doctor or health care professional if you notice any unusual bleeding. Talk to your doctor   about your risk of cancer. You may be more at risk for certain types of cancers if you take this medicine. Do not become pregnant while taking this medicine or for 6 months after stopping it. Women should inform their doctor if they wish to become pregnant or think they might be pregnant. Men should not father a child while taking this  medicine and for 6 months after stopping it. There is a potential for serious side effects to an unborn child. Talk to your health care professional or pharmacist for more information. Do not breast-feed an infant while taking this medicine. This medicine has caused ovarian failure in some women and reduced sperm counts in some men This medicine may interfere with the ability to have a child. Talk with your doctor or health care professional if you are concerned about your fertility. This medicine may cause a decrease in Co-Enzyme Q-10. You should make sure that you get enough Co-Enzyme Q-10 while you are taking this medicine. Discuss the foods you eat and the vitamins you take with your health care professional. What side effects may I notice from receiving this medicine? Side effects that you should report to your doctor or health care professional as soon as possible:  allergic reactions like skin rash, itching or hives, swelling of the face, lips, or tongue  breathing problems  chest pain  fast or irregular heartbeat  low blood counts - this medicine may decrease the number of white blood cells, red blood cells and platelets. You may be at increased risk for infections and bleeding.  pain, redness, or irritation at site where injected  signs of infection - fever or chills, cough, sore throat, pain or difficulty passing urine  signs of decreased platelets or bleeding - bruising, pinpoint red spots on the skin, black, tarry stools, blood in the urine  swelling of the ankles, feet, hands  tiredness  weakness Side effects that usually do not require medical attention (report to your doctor or health care professional if they continue or are bothersome):  diarrhea  hair loss  mouth sores  nail discoloration or damage  nausea  red colored urine  vomiting This list may not describe all possible side effects. Call your doctor for medical advice about side effects. You may report  side effects to FDA at 1-800-FDA-1088. Where should I keep my medicine? This drug is given in a hospital or clinic and will not be stored at home. NOTE: This sheet is a summary. It may not cover all possible information. If you have questions about this medicine, talk to your doctor, pharmacist, or health care provider.  2020 [employer]/Gold Standard (2016-10-12 11:01:26) Cyclophosphamide injection What is this medicine? CYCLOPHOSPHAMIDE (sye kloe FOSS fa mide) is a chemotherapy drug. It slows the growth of cancer cells. This medicine is used to treat many types of cancer like lymphoma, myeloma, leukemia, breast cancer, and ovarian cancer, to name a few. This medicine may be used for other purposes; ask your health care provider or pharmacist if you have questions. COMMON BRAND NAME(S): Cytoxan, Neosar What should I tell my health care provider before I take this medicine? They need to know if you have any of these conditions:  blood disorders  history of other chemotherapy  infection  kidney disease  liver disease  recent or ongoing radiation therapy  tumors in the bone marrow  an unusual or allergic reaction to cyclophosphamide, other chemotherapy, other medicines, foods, dyes, or preservatives  pregnant or trying to get  pregnant  breast-feeding How should I use this medicine? This drug is usually given as an injection into a vein or muscle or by infusion into a vein. It is administered in a hospital or clinic by a specially trained health care professional. Talk to your pediatrician regarding the use of this medicine in children. Special care may be needed. Overdosage: If you think you have taken too much of this medicine contact a poison control center or emergency room at once. NOTE: This medicine is only for you. Do not share this medicine with others. What if I miss a dose? It is important not to miss your dose. Call your doctor or health care professional if you are  unable to keep an appointment. What may interact with this medicine? This medicine may interact with the following medications:  amiodarone  amphotericin B  azathioprine  certain antiviral medicines for HIV or AIDS such as protease inhibitors (e.g., indinavir, ritonavir) and zidovudine  certain blood pressure medications such as benazepril, captopril, enalapril, fosinopril, lisinopril, moexipril, monopril, perindopril, quinapril, ramipril, trandolapril  certain cancer medications such as anthracyclines (e.g., daunorubicin, doxorubicin), busulfan, cytarabine, paclitaxel, pentostatin, tamoxifen, trastuzumab  certain diuretics such as chlorothiazide, chlorthalidone, hydrochlorothiazide, indapamide, metolazone  certain medicines that treat or prevent blood clots like warfarin  certain muscle relaxants such as succinylcholine  cyclosporine  etanercept  indomethacin  medicines to increase blood counts like filgrastim, pegfilgrastim, sargramostim  medicines used as general anesthesia  metronidazole  natalizumab This list may not describe all possible interactions. Give your health care provider a list of all the medicines, herbs, non-prescription drugs, or dietary supplements you use. Also tell them if you smoke, drink alcohol, or use illegal drugs. Some items may interact with your medicine. What should I watch for while using this medicine? Visit your doctor for checks on your progress. This drug may make you feel generally unwell. This is not uncommon, as chemotherapy can affect healthy cells as well as cancer cells. Report any side effects. Continue your course of treatment even though you feel ill unless your doctor tells you to stop. Drink water or other fluids as directed. Urinate often, even at night. In some cases, you may be given additional medicines to help with side effects. Follow all directions for their use. Call your doctor or health care professional for advice if  you get a fever, chills or sore throat, or other symptoms of a cold or flu. Do not treat yourself. This drug decreases your body's ability to fight infections. Try to avoid being around people who are sick. This medicine may increase your risk to bruise or bleed. Call your doctor or health care professional if you notice any unusual bleeding. Be careful brushing and flossing your teeth or using a toothpick because you may get an infection or bleed more easily. If you have any dental work done, tell your dentist you are receiving this medicine. You may get drowsy or dizzy. Do not drive, use machinery, or do anything that needs mental alertness until you know how this medicine affects you. Do not become pregnant while taking this medicine or for 1 year after stopping it. Women should inform their doctor if they wish to become pregnant or think they might be pregnant. Men should not father a child while taking this medicine and for 4 months after stopping it. There is a potential for serious side effects to an unborn child. Talk to your health care professional or pharmacist for more information. Do not breast-feed  an infant while taking this medicine. This medicine may interfere with the ability to have a child. This medicine has caused ovarian failure in some women. This medicine has caused reduced sperm counts in some men. You should talk with your doctor or health care professional if you are concerned about your fertility. If you are going to have surgery, tell your doctor or health care professional that you have taken this medicine. What side effects may I notice from receiving this medicine? Side effects that you should report to your doctor or health care professional as soon as possible:  allergic reactions like skin rash, itching or hives, swelling of the face, lips, or tongue  low blood counts - this medicine may decrease the number of white blood cells, red blood cells and platelets. You may be  at increased risk for infections and bleeding.  signs of infection - fever or chills, cough, sore throat, pain or difficulty passing urine  signs of decreased platelets or bleeding - bruising, pinpoint red spots on the skin, black, tarry stools, blood in the urine  signs of decreased red blood cells - unusually weak or tired, fainting spells, lightheadedness  breathing problems  dark urine  dizziness  palpitations  swelling of the ankles, feet, hands  trouble passing urine or change in the amount of urine  weight gain  yellowing of the eyes or skin Side effects that usually do not require medical attention (report to your doctor or health care professional if they continue or are bothersome):  changes in nail or skin color  hair loss  missed menstrual periods  mouth sores  nausea, vomiting This list may not describe all possible side effects. Call your doctor for medical advice about side effects. You may report side effects to FDA at 1-800-FDA-1088. Where should I keep my medicine? This drug is given in a hospital or clinic and will not be stored at home. NOTE: This sheet is a summary. It may not cover all possible information. If you have questions about this medicine, talk to your doctor, pharmacist, or health care provider.  2020 [employer]/Gold Standard (2012-01-13 16:22:58) Paclitaxel injection What is this medicine? PACLITAXEL (PAK li TAX el) is a chemotherapy drug. It targets fast dividing cells, like cancer cells, and causes these cells to die. This medicine is used to treat ovarian cancer, breast cancer, lung cancer, Kaposi's sarcoma, and other cancers. This medicine may be used for other purposes; ask your health care provider or pharmacist if you have questions. COMMON BRAND NAME(S): Onxol, Taxol What should I tell my health care provider before I take this medicine? They need to know if you have any of these conditions:  history of irregular  heartbeat  liver disease  low blood counts, like low white cell, platelet, or red cell counts  lung or breathing disease, like asthma  tingling of the fingers or toes, or other nerve disorder  an unusual or allergic reaction to paclitaxel, alcohol, polyoxyethylated castor oil, other chemotherapy, other medicines, foods, dyes, or preservatives  pregnant or trying to get pregnant  breast-feeding How should I use this medicine? This drug is given as an infusion into a vein. It is administered in a hospital or clinic by a specially trained health care professional. Talk to your pediatrician regarding the use of this medicine in children. Special care may be needed. Overdosage: If you think you have taken too much of this medicine contact a poison control center or emergency room at once. NOTE: This  medicine is only for you. Do not share this medicine with others. What if I miss a dose? It is important not to miss your dose. Call your doctor or health care professional if you are unable to keep an appointment. What may interact with this medicine? Do not take this medicine with any of the following medications:  disulfiram  metronidazole This medicine may also interact with the following medications:  antiviral medicines for hepatitis, HIV or AIDS  certain antibiotics like erythromycin and clarithromycin  certain medicines for fungal infections like ketoconazole and itraconazole  certain medicines for seizures like carbamazepine, phenobarbital, phenytoin  gemfibrozil  nefazodone  rifampin  St. John's wort This list may not describe all possible interactions. Give your health care provider a list of all the medicines, herbs, non-prescription drugs, or dietary supplements you use. Also tell them if you smoke, drink alcohol, or use illegal drugs. Some items may interact with your medicine. What should I watch for while using this medicine? Your condition will be monitored  carefully while you are receiving this medicine. You will need important blood work done while you are taking this medicine. This medicine can cause serious allergic reactions. To reduce your risk you will need to take other medicine(s) before treatment with this medicine. If you experience allergic reactions like skin rash, itching or hives, swelling of the face, lips, or tongue, tell your doctor or health care professional right away. In some cases, you may be given additional medicines to help with side effects. Follow all directions for their use. This drug may make you feel generally unwell. This is not uncommon, as chemotherapy can affect healthy cells as well as cancer cells. Report any side effects. Continue your course of treatment even though you feel ill unless your doctor tells you to stop. Call your doctor or health care professional for advice if you get a fever, chills or sore throat, or other symptoms of a cold or flu. Do not treat yourself. This drug decreases your body's ability to fight infections. Try to avoid being around people who are sick. This medicine may increase your risk to bruise or bleed. Call your doctor or health care professional if you notice any unusual bleeding. Be careful brushing and flossing your teeth or using a toothpick because you may get an infection or bleed more easily. If you have any dental work done, tell your dentist you are receiving this medicine. Avoid taking products that contain aspirin, acetaminophen, ibuprofen, naproxen, or ketoprofen unless instructed by your doctor. These medicines may hide a fever. Do not become pregnant while taking this medicine. Women should inform their doctor if they wish to become pregnant or think they might be pregnant. There is a potential for serious side effects to an unborn child. Talk to your health care professional or pharmacist for more information. Do not breast-feed an infant while taking this medicine. Men are  advised not to father a child while receiving this medicine. This product may contain alcohol. Ask your pharmacist or healthcare provider if this medicine contains alcohol. Be sure to tell all healthcare providers you are taking this medicine. Certain medicines, like metronidazole and disulfiram, can cause an unpleasant reaction when taken with alcohol. The reaction includes flushing, headache, nausea, vomiting, sweating, and increased thirst. The reaction can last from 30 minutes to several hours. What side effects may I notice from receiving this medicine? Side effects that you should report to your doctor or health care professional as soon as possible:  allergic reactions like skin rash, itching or hives, swelling of the face, lips, or tongue  breathing problems  changes in vision  fast, irregular heartbeat  high or low blood pressure  mouth sores  pain, tingling, numbness in the hands or feet  signs of decreased platelets or bleeding - bruising, pinpoint red spots on the skin, black, tarry stools, blood in the urine  signs of decreased red blood cells - unusually weak or tired, feeling faint or lightheaded, falls  signs of infection - fever or chills, cough, sore throat, pain or difficulty passing urine  signs and symptoms of liver injury like dark yellow or brown urine; general ill feeling or flu-like symptoms; light-colored stools; loss of appetite; nausea; right upper belly pain; unusually weak or tired; yellowing of the eyes or skin  swelling of the ankles, feet, hands  unusually slow heartbeat Side effects that usually do not require medical attention (report to your doctor or health care professional if they continue or are bothersome):  diarrhea  hair loss  loss of appetite  muscle or joint pain  nausea, vomiting  pain, redness, or irritation at site where injected  tiredness This list may not describe all possible side effects. Call your doctor for medical  advice about side effects. You may report side effects to FDA at 1-800-FDA-1088. Where should I keep my medicine? This drug is given in a hospital or clinic and will not be stored at home. NOTE: This sheet is a summary. It may not cover all possible information. If you have questions about this medicine, talk to your doctor, pharmacist, or health care provider.  2020 [employer] Standard (2016-11-01 13:14:55)

## 2019-02-25 ENCOUNTER — Encounter: Payer: Self-pay | Admitting: Internal Medicine

## 2019-02-25 ENCOUNTER — Encounter: Payer: Self-pay | Admitting: *Deleted

## 2019-02-25 ENCOUNTER — Other Ambulatory Visit: Payer: Self-pay

## 2019-02-25 ENCOUNTER — Inpatient Hospital Stay (HOSPITAL_BASED_OUTPATIENT_CLINIC_OR_DEPARTMENT_OTHER): Payer: Medicaid Other | Admitting: Nurse Practitioner

## 2019-02-25 ENCOUNTER — Inpatient Hospital Stay: Payer: Medicaid Other

## 2019-02-25 DIAGNOSIS — K219 Gastro-esophageal reflux disease without esophagitis: Secondary | ICD-10-CM

## 2019-02-25 DIAGNOSIS — Z8042 Family history of malignant neoplasm of prostate: Secondary | ICD-10-CM

## 2019-02-25 DIAGNOSIS — C50212 Malignant neoplasm of upper-inner quadrant of left female breast: Secondary | ICD-10-CM | POA: Diagnosis not present

## 2019-02-25 DIAGNOSIS — Z170 Estrogen receptor positive status [ER+]: Secondary | ICD-10-CM

## 2019-02-25 DIAGNOSIS — Z87891 Personal history of nicotine dependence: Secondary | ICD-10-CM | POA: Diagnosis not present

## 2019-02-25 DIAGNOSIS — F419 Anxiety disorder, unspecified: Secondary | ICD-10-CM

## 2019-02-25 DIAGNOSIS — Z79899 Other long term (current) drug therapy: Secondary | ICD-10-CM

## 2019-02-25 DIAGNOSIS — Z803 Family history of malignant neoplasm of breast: Secondary | ICD-10-CM | POA: Diagnosis not present

## 2019-02-25 NOTE — Telephone Encounter (Signed)
With [facility] if the patient is establish she will need to call the office to set up another appointment.

## 2019-02-25 NOTE — Progress Notes (Signed)
Virtual Visit Progress Note  [place] [facility]  [phone] [phone]  Patient Care Team: McLean-Scocuzza, Nino Glow, MD as PCP - General (Internal Medicine) Rico Junker, RN as Registered Nurse   Name of the patient: Leah [name]  [id number]  1965/06/11   Date of visit: 02/25/19  I connected with Leah [name] on 02/25/19 at  1:00 PM EST by video enabled telemedicine visit and verified that I am speaking with the correct person using two identifiers.   I discussed the limitations, risks, security and privacy concerns of performing an evaluation and management service by telemedicine and the availability of in-person appointments. I also discussed with the patient that there may be a patient responsible charge related to this service. The patient expressed understanding and agreed to proceed.   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  She is status post lumpectomy.  She underwent MammaPrint testing which came back as high risk with a recurrence of 29% at 10 years without chemotherapy.  She is young, fit, and no significant comorbidities with node positive disease and high risk MammaPrint score.  Therefore, adjuvant chemotherapy was recommended with dose dense Adriamycin and Cytoxan for 4 cycles followed by 12 weekly cycles of Taxol.  ABC trial showed benefit for anthracycline-based  regimen for node positive disease and I would therefore prefer that over a nonanthracycline regimen. Patient will have a port placed at the time of her reexcision on 02/15/2019. Based on ER/PR positivity of her breast cancer she would be a candidate for hormone therapy as well as adjuvant radiation treatment upon the completion of chemotherapy.  She would also qualify for adjuvant bisphosphonates upon completion of chemotherapy with Zometa every 6 months for 3 to 5 years.  Oncology History  Breast cancer ([place])  12/28/2018 Initial Diagnosis   Breast cancer ([place])   01/03/2019 Cancer Staging   Staging form: Breast, AJCC 8th Edition - Clinical stage from 01/03/2019: Stage IA (cT1c, cN0, cM0, G2, ER+, PR+, HER2-) - Signed by Sindy Guadeloupe, MD on 01/03/2019   01/24/2019 Cancer Staging   Staging form: Breast, AJCC 8th Edition - Pathologic stage from 01/24/2019: Stage IA (pT1b, pN1a, cM0, G1, ER+, PR+, HER2-) - Signed by Sindy Guadeloupe, MD on 01/25/2019    Genetic Testing   No pathogenic variants identified. VUS in BARD1 called c.1672T>C (p.Ser558Pro) identified on the [facility] Common Hereditary Cancers Panel. The report date is 01/29/2019.  The Common Hereditary Cancers Panel offered by [facility] includes sequencing and/or deletion duplication testing of the following 47 genes: APC, ATM, AXIN2, BARD1, BMPR1A, BRCA1, BRCA2, BRIP1, CDH1, CDKN2A (p14ARF), CDKN2A (p16INK4a), CKD4, CHEK2, CTNNA1, DICER1, EPCAM (Deletion/duplication testing only), GREM1 (promoter region deletion/duplication testing only), KIT, MEN1, MLH1, MSH2, MSH3, MSH6, MUTYH, NBN, NF1, NHTL1, PALB2, PDGFRA, PMS2, POLD1, POLE, PTEN, RAD50, RAD51C, RAD51C, SDHB, SDHC, SDHD, SMAD4, SMARCA4. STK11, TP53, TSC1, TSC2, and VHL.  The following genes were evaluated for sequence changes only: SDHA and HOXB13 c.251G>A variant only.   02/16/2019 -  Chemotherapy  The patient had DOXOrubicin (ADRIAMYCIN) chemo injection 112 mg, 60 mg/m2, Intravenous,   Once, 0 of 4 cycles PALONOSETRON HCL INJECTION 0.25 MG/5ML, 0.25 mg, Intravenous,  Once, 0 of 4 cycles pegfilgrastim-jmdb (FULPHILA) injection 6 mg, 6 mg, Subcutaneous,  Once, 0 of 4 cycles cyclophosphamide (CYTOXAN) 1,120 mg in sodium chloride 0.9 % 250 mL chemo infusion, 600 mg/m2, Intravenous,  Once, 0 of 4 cycles PACLitaxel (TAXOL) 150 mg in sodium chloride 0.9 % 250 mL chemo infusion (</= 105m/m2), 80 mg/m2, Intravenous,  Once, 0 of 12 cycles FOSAPREPITANT IV INFUSION 150 MG, 150 mg, Intravenous,  Once, 0 of 4 cycles  for chemotherapy treatment.      Interval history-  Leah [name] 53year old female, who presents to chemo care clinic today for initial meeting in preparation for starting chemotherapy. I introduced the chemo care clinic and we discussed that the role of the clinic is to assist those who are at an increased risk of emergency room visits and/or complications during the course of chemotherapy treatment. We discussed that the increased risk takes into account factors such as age, performance status, and co-morbidities. We also discussed that for some, this might include barriers to care such as not having a primary care provider, lack of insurance/transportation, or not being able to afford medications. We discussed that the goal of the program is to help prevent unplanned ER visits and help reduce complications during chemotherapy. We do this by discussing specific risk factors to each individual and identifying ways that we can help improve these risk factors and reduce barriers to care.  We discussed that she was identified as high risk primarily based on previous ER and hospitalizations, Medicaid status, not identifying as being in a relationship, and past medical history significant for depression.  She reports that she has a boyfriend locally, [place], who is primary support system as well as close friends, however, most of [place] friends and family [place]  She has been  followed by psychiatry for depression but has not seen them recently due to previous loss of insurance and has not reestablished care.  She says however that her symptoms are well controlled and she is currently coping well with new diagnosis.  She struggled with possibility of hair loss and has recently shaved her head.  She says her friends have been supportive and her boyfriend has encouraged her to look it wigs.  She reports some anxiety and loss of control/uncertainty.   Past medical and surgical history, current allergies, current medications, social history, and family history were reviewed and updated as below.  ECOG FS:0 - Asymptomatic  Review of systems- Review of Systems  Constitutional: Negative for chills, fever, malaise/fatigue and weight loss.  HENT: Negative for hearing loss, nosebleeds, sore throat and tinnitus.   Eyes: Negative for blurred vision and double vision.  Respiratory: Negative for cough, hemoptysis, shortness of breath and wheezing.   Cardiovascular: Negative for chest pain, palpitations and leg swelling.  Gastrointestinal: Negative for abdominal pain, blood in stool, constipation, diarrhea, melena, nausea and vomiting.  Genitourinary: Negative for dysuria and urgency.  Musculoskeletal: Negative for back pain, falls, joint pain and myalgias.  Skin: Negative for itching and rash.  Neurological: Negative for dizziness, tingling, sensory change, loss of consciousness, weakness and headaches.  Endo/Heme/Allergies: Negative for environmental allergies. Does not bruise/bleed easily.  Psychiatric/Behavioral: Negative for depression. The patient is not nervous/anxious and does not have insomnia.      Current treatment-adjuvant chemotherapy with dose dense Adriamycin  Cytoxan x 4 cycles followed by 12 weekly cycles of Taxol  Allergies  Allergen Reactions  . Hydrocodone-Acetaminophen Hives    All over her body.  . Lactose Intolerance (Gi) Other (See Comments)    Bloating  and GI upset  . Penicillins Rash    Did it involve swelling of the face/tongue/throat, SOB, or low BP? Unknown Did it involve sudden or severe rash/hives, skin peeling, or any reaction on the inside of your mouth or nose? Yes Did you need to seek medical attention at a hospital or doctor's office? Unknown When did it last happen? Childhood reaction. If all above answers are "NO", may proceed with cephalosporin use.   . Sulfa Antibiotics Rash    Full body rash     Past Medical History:  Diagnosis Date  . Anxiety   . Asthma    as a child  . Carpal tunnel syndrome    R hand   . Colon polyps   . Depression   . Eosinophilic esophagitis    noted in [place] with GI path report 09/29/14   . Esophageal stricture    s/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 Edwin Cap   . Family history of breast cancer   . Family history of prostate cancer   . Herniated disc, cervical    s/p epidural injection  . History of kidney stones   . Hyperlipidemia   . Low back pain   . Migraines     Past Surgical History:  Procedure Laterality Date  . BREAST BIOPSY Left 12/25/2018   [place] BX, PATH PENDING  . BREAST LUMPECTOMY WITH NEEDLE LOCALIZATION AND AXILLARY SENTINEL LYMPH NODE BX Left 01/16/2019   Procedure: LEFT BREAST LUMPECTOMY WITH NEEDLE LOCALIZATION AND SENTINEL LYMPH NODE BIOPSY;  Surgeon: Jovita Kussmaul, MD;  Location: [facility] ORS;  Service: General;  Laterality: Left;  . CESAREAN SECTION    . COLONOSCOPY WITH PROPOFOL N/A 03/27/2017   Procedure: COLONOSCOPY WITH PROPOFOL;  Surgeon: Jonathon Bellows, MD;  Location: [facility] ENDOSCOPY;  Service: Gastroenterology;  Laterality: N/A;  . ESOPHAGOGASTRODUODENOSCOPY    . ESOPHAGOGASTRODUODENOSCOPY (EGD) WITH PROPOFOL N/A 03/27/2017   Procedure: ESOPHAGOGASTRODUODENOSCOPY (EGD) WITH PROPOFOL WITH DILATION;  Surgeon: Jonathon Bellows, MD;  Location: [facility] ENDOSCOPY;  Service: Gastroenterology;  Laterality: N/A;  . PORTACATH PLACEMENT N/A 02/15/2019    Procedure: INSERTION PORT-A-CATH WITH POSSIBLE ULTRASOUND;  Surgeon: Jovita Kussmaul, MD;  Location: [facility] ORS;  Service: General;  Laterality: N/A;  . RE-EXCISION OF BREAST CANCER,SUPERIOR MARGINS Left 02/15/2019   Procedure: RE-EXCISION OF LEFT BREAST CANCER ANTERIOR MARGINS;  Surgeon: Jovita Kussmaul, MD;  Location: [facility] ORS;  Service: General;  Laterality: Left;  . THROAT SURGERY  2015  . UMBILICAL HERNIA REPAIR  2006    x 2   w mesh per pt    Social History   Socioeconomic History  . Marital status: Single    Spouse name: Not on file  . Number of children: Not on file  . Years of education: Not on file  . Highest education level: Not on file  Occupational History  . Not on file  Tobacco Use  . Smoking status: Former Smoker    Packs/day: 1.00    Years: 20.00    Pack years: 20.00    Quit date: 06/02/2006    Years since quitting: 12.7  . Smokeless tobacco: Never Used  Substance and Sexual Activity  . Alcohol use: Yes    Alcohol/week: 2.0 - 3.0 standard drinks  Types: 2 - 3 Standard drinks or equivalent per week    Comment: socially  . Drug use: No  . Sexual activity: Yes    Partners: Male  Other Topics Concern  . 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 As of 02/26/17    Divorced.    Former smoker 20+ years quit in 1997 then started again for 5 years and quit 10-12 years ago as of 03/08/17    Social Determinants of Health   Financial Resource Strain:   . Difficulty of Paying Living Expenses: Not on file  Food Insecurity:   . Worried About Charity fundraiser in the Last Year: Not on file  . Ran Out of Food in the Last Year: Not on file  Transportation Needs:   . Lack of Transportation (Medical): Not on file  . Lack of Transportation (Non-Medical): Not on file  Physical Activity:   . Days of Exercise per Week: Not on file  . Minutes of  Exercise per Session: Not on file  Stress:   . Feeling of Stress : Not on file  Social Connections:   . Frequency of Communication with Friends and Family: Not on file  . Frequency of Social Gatherings with Friends and Family: Not on file  . Attends Religious Services: Not on file  . Active Member of Clubs or Organizations: Not on file  . Attends Archivist Meetings: Not on file  . Marital Status: Not on file  Intimate Partner Violence:   . Fear of Current or Ex-Partner: Not on file  . Emotionally Abused: Not on file  . Physically Abused: Not on file  . Sexually Abused: Not on file    Family History  Problem Relation Age of Onset  . Breast cancer Mother        dx late 42s  . Thyroid disease Mother   . Colon polyps Mother   . Prostate cancer Father        dx 50  . Breast cancer Maternal Grandmother        dx late 51s  . Cancer Maternal Uncle        possibly, unsure type     Current Outpatient Medications:  .  acetaminophen (TYLENOL) 500 MG tablet, Take 500-1,000 mg by mouth every 6 (six) hours as needed (for pain.)., Disp: , Rfl:  .  Cholecalciferol (VITAMIN D-3) 125 MCG (5000 UT) TABS, Take 5,000 Units by mouth daily., Disp: , Rfl:  .  Cyanocobalamin (VITAMIN B-12 PO), Take 1 tablet by mouth daily., Disp: , Rfl:  .  dexamethasone (DECADRON) 4 MG tablet, Take 2 tablets by mouth once a day on the day after chemotherapy and then take 2 tablets two times a day for 2 days. Take with food., Disp: 30 tablet, Rfl: 1 .  fluticasone (FLONASE) 50 MCG/ACT nasal spray, Place 2 sprays into both nostrils daily. Max 2 sprays (Patient taking differently: Place 2 sprays into both nostrils at bedtime. Max 2 sprays), Disp: 16 g, Rfl: 12 .  gabapentin (NEURONTIN) 300 MG capsule, Take 300 mg by mouth at bedtime., Disp: , Rfl:  .  lidocaine-prilocaine (EMLA) cream, Apply to affected area once, Disp: 30 g, Rfl: 3 .  meclizine (ANTIVERT) 12.5 MG tablet, Take 1 tablet (12.5 mg total) by mouth  3 (three) times  daily as needed for dizziness., Disp: 90 tablet, Rfl: 1 .  meloxicam (MOBIC) 15 MG tablet, Take 15 mg by mouth daily., Disp: , Rfl:  .  methocarbamol (ROBAXIN) 750 MG tablet, Take 1 tablet (750 mg total) by mouth every 6 (six) hours as needed for muscle spasms., Disp: 120 tablet, Rfl: 5 .  montelukast (SINGULAIR) 10 MG tablet, Take 1 tablet (10 mg total) by mouth at bedtime., Disp: 90 tablet, Rfl: 3 .  omeprazole (PRILOSEC) 40 MG capsule, Take 40 mg by mouth every evening. , Disp: , Rfl:  .  ondansetron (ZOFRAN) 8 MG tablet, Take 1 tablet (8 mg total) by mouth 2 (two) times daily as needed. Start on the third day after chemotherapy., Disp: 30 tablet, Rfl: 1 .  prochlorperazine (COMPAZINE) 10 MG tablet, Take 1 tablet (10 mg total) by mouth every 6 (six) hours as needed (Nausea or vomiting)., Disp: 30 tablet, Rfl: 1 .  tiZANidine (ZANAFLEX) 4 MG tablet, Take 4 mg by mouth at bedtime. , Disp: , Rfl:  .  traMADol (ULTRAM) 50 MG tablet, Take 1-2 tablets (50-100 mg total) by mouth every 6 (six) hours as needed., Disp: 20 tablet, Rfl: 0 .  traMADol (ULTRAM) 50 MG tablet, Take 1-2 tablets (50-100 mg total) by mouth every 6 (six) hours as needed., Disp: 20 tablet, Rfl: 0 .  traZODone (DESYREL) 50 MG tablet, Take 1-1.5 tablets (50-75 mg total) by mouth at bedtime as needed for sleep. (Patient taking differently: Take 50 mg by mouth at bedtime as needed for sleep. ), Disp: 135 tablet, Rfl: 4  Physical exam: There were no vitals filed for this visit. Physical Exam Constitutional:      Appearance: She is well-developed.  HENT:     Head: Atraumatic.     Nose: Nose normal.     Mouth/Throat:     Pharynx: No oropharyngeal exudate.  Eyes:     General: No scleral icterus.    Conjunctiva/sclera: Conjunctivae normal.     Pupils: Pupils are equal, round, and reactive to light.  Cardiovascular:     Rate and Rhythm: Normal rate and regular rhythm.  Pulmonary:     Effort: Pulmonary effort is  normal.     Breath sounds: Normal breath sounds.  Abdominal:     General: Bowel sounds are normal. There is no distension.     Palpations: Abdomen is soft.  Musculoskeletal:        General: Normal range of motion.     Cervical back: Normal range of motion and neck supple.  Skin:    General: Skin is warm and dry.  Neurological:     Mental Status: She is alert and oriented to person, place, and time.  Psychiatric:        Mood and Affect: Mood normal.        Behavior: Behavior normal.      CMP Latest Ref Rng & Units 10/04/2018  Glucose 70 - 99 mg/dL 99  BUN 6 - 23 mg/dL 17  Creatinine 0.40 - 1.20 mg/dL 0.67  Sodium 135 - 145 mEq/L 138  Potassium 3.5 - 5.1 mEq/L 4.0  Chloride 96 - 112 mEq/L 103  CO2 19 - 32 mEq/L 25  Calcium 8.4 - 10.5 mg/dL 9.7  Total Protein 6.0 - 8.3 g/dL 6.8  Total Bilirubin 0.2 - 1.2 mg/dL 0.5  Alkaline Phos 39 - 117 U/L 42  AST 0 - 37 U/L 14  ALT 0 - 35 U/L 14   CBC Latest Ref  Rng & Units 10/04/2018  WBC 4.0 - 10.5 K/uL 6.9  Hemoglobin 12.0 - 15.0 g/dL 16.1(H)  Hematocrit 36.0 - 46.0 % 46.0  Platelets 150.0 - 400.0 K/uL 335.0    No images are attached to the encounter.  DG Chest Port 1 View  Result Date: 02/15/2019 CLINICAL DATA:  Status post Port-A-Cath insertion. EXAM: PORTABLE CHEST 1 VIEW COMPARISON:  Chest x-ray dated 07/29/2016 FINDINGS: Right-sided power port has been inserted. The tip is in the superior vena cava at the level of the carina in good position. No pneumothorax. Heart size and vascularity are normal. Lungs are clear. Surgical clips in the left breast and left axilla. IMPRESSION: Satisfactory appearance of the chest after Port-A-Cath insertion on the right. No pneumothorax. Electronically Signed   By: Lorriane Shire M.D.   On: 02/15/2019 13:37   DG C-Arm 1-60 Min-No Report  Result Date: 02/15/2019 Fluoroscopy was utilized by the requesting physician.  No radiographic interpretation.     Assessment and plan- Patient is a 53 y.o.  female who presents to [facility] for initial meeting in preparation for starting chemotherapy for the treatment of left breast cancer.   1. Left breast cancer-underwent reexcision of left breast anterior margins with insertion of port with Dr. Marlou Starks on 02/15/2019.  Pathologic stage Ia, ER/PR positive, HER-2/neu negative.  MammaPrint was high risk with the risk of recurrence of 29% at 10 years.  Given this, adjuvant chemotherapy with dose dense Adriamycin and Cytoxan for 4 cycles followed by 12 weekly cycles of Taxol was recommended.  Awaiting baseline echocardiogram prior to starting chemotherapy.  We again reviewed side effects, risks, and benefits of chemotherapy today.  Treatment is given with curative intent.  2. Chemo Care Clinic/High Risk for ER/Hospitalization during chemotherapy- We discussed the role of the chemo care clinic and identified patient specific risk factors. I discussed that patient was identified as moderate risk primarily based on: History of hospital admissions and prior ER visits, Medicaid status, history of asthma, and history of depression.  She currently has a PCP whom she sees regularly.  Past hospitalizations are primarily secondary to her cancer diagnosis.  Asthma is managed by PCP and no recent exacerbations.  Not currently seeing anyone for counseling (see below).  3. Social Determinants of Health- we discussed that social determinants of health may have significant impacts on health and outcomes for cancer patients.  Today we discussed specific social determinants of performance status, alcohol use, depression, financial needs, food insecurity, housing, interpersonal violence, social connections, stress, tobacco use, and transportation.  After lengthy discussion we identified some specific needs and discussed other programs available through the cancer center.  I advised her that its important to stay physically active during chemotherapy as patients tend to report  better tolerability, less fatigue, and fewer side effects of treatment.  Also advised that if her baseline activity status worsens we can consider home based and outpatient services, DME or she can participate in the care program.  She has a history of depression.  Not currently seeing anyone.  Medications managed by her PCP.  I advised her of self referral to [facility] for counseling services, options for referral to psychiatry for medication management, and/or palliative care for symptom management as well as continued follow-up with her PCP.  She currently denies financial insecurity however, we discussed that living with cancer can create tremendous financial burden.  We discussed options for assistance. I asked that if assistance is needed in affording medications or paying  bills to please let us know so that we can provide assistance.   She currently denies food insecurity.  If this becomes a problem, she will notify cancer center.  Also discussed food pantry available.  She will notify her primary medical oncology team if unmet needs.  Currently endorses housing insecurity.  We discussed her contacting social services.  She will also contact [facility] for temporary assistance and potential support of utility bills etc.  She denies a lack of social connections and feels well supported at home.  We discussed options for support groups at the cancer center.  If she is interested she will contact nurse navigator to enroll.  Based on concern of stress-we discussed options for managing stress including healthy eating, exercise as well as participating in no charge counseling services at the cancer center and support groups.  If these are of interest, patient can notify either myself or primary nursing team.  She is a former smoker and quit in March 2018.  I encouraged her to continue to avoid tobacco products.  She is committed to staying tobacco free.  She currently denies needing  transportation assistance.  Should this become a problem in the future she can consider options for transportation including acta, paratransit, bus routes, link transit, taxi/uber/lyft, and cancer center [place] discussed the role of palliative care in the cancer journey.  We discussed advance care planning.  Will make referral to palliative care today.  We also discussed the role of the Symptom Management Clinic at [facility] for acute issues and methods of contacting clinic/provider. She denies needing specific assistance at this time and She will be followed by Koren Shiver, RN and Tanya Nones, RN (Nurse Navigators).   Disposition: Follow-up with Dr. Janese Banks as scheduled.    Visit Diagnosis 1. Malignant neoplasm of upper-inner quadrant of left breast in female, estrogen receptor positive ([place])    I discussed the assessment and treatment plan with the patient. The patient was provided an opportunity to ask questions and all were answered. The patient agreed with the plan and demonstrated an understanding of the instructions.   The patient was advised to call back or seek an in-person evaluation if the symptoms worsen or if the condition fails to improve as anticipated.   I provided 20 minutes of face-to-face video visit time during this encounter, and > 50% was spent counseling as documented under my assessment & plan.  Beckey Rutter, DNP, AGNP-C [place] at [facility] [phone] (clinic)

## 2019-02-26 ENCOUNTER — Ambulatory Visit
Admission: RE | Admit: 2019-02-26 | Discharge: 2019-02-26 | Disposition: A | Discharge status: Home / Self Care | Payer: Medicaid Other | Source: Ambulatory Visit | Attending: Oncology | Admitting: Oncology

## 2019-02-26 DIAGNOSIS — Z170 Estrogen receptor positive status [ER+]: Secondary | ICD-10-CM | POA: Diagnosis not present

## 2019-02-26 DIAGNOSIS — C50212 Malignant neoplasm of upper-inner quadrant of left female breast: Secondary | ICD-10-CM | POA: Insufficient documentation

## 2019-02-26 DIAGNOSIS — G43909 Migraine, unspecified, not intractable, without status migrainosus: Secondary | ICD-10-CM | POA: Insufficient documentation

## 2019-02-26 DIAGNOSIS — E785 Hyperlipidemia, unspecified: Secondary | ICD-10-CM | POA: Insufficient documentation

## 2019-02-26 DIAGNOSIS — Z01818 Encounter for other preprocedural examination: Secondary | ICD-10-CM | POA: Diagnosis not present

## 2019-02-26 DIAGNOSIS — I081 Rheumatic disorders of both mitral and tricuspid valves: Secondary | ICD-10-CM | POA: Diagnosis not present

## 2019-02-26 NOTE — Progress Notes (Signed)
*  PRELIMINARY RESULTS* Echocardiogram 2D Echocardiogram has been performed.  Leah [name] 02/26/2019, 11:05 AM

## 2019-02-28 ENCOUNTER — Encounter: Payer: Self-pay | Admitting: *Deleted

## 2019-03-01 ENCOUNTER — Other Ambulatory Visit: Payer: Self-pay | Admitting: *Deleted

## 2019-03-01 ENCOUNTER — Ambulatory Visit: Payer: Medicaid Other | Attending: Internal Medicine

## 2019-03-01 DIAGNOSIS — Z20828 Contact with and (suspected) exposure to other viral communicable diseases: Secondary | ICD-10-CM | POA: Diagnosis not present

## 2019-03-01 DIAGNOSIS — Z20822 Contact with and (suspected) exposure to covid-19: Secondary | ICD-10-CM

## 2019-03-02 DIAGNOSIS — Z23 Encounter for immunization: Secondary | ICD-10-CM | POA: Diagnosis not present

## 2019-03-02 LAB — NOVEL CORONAVIRUS, NAA: SARS-CoV-2, NAA: DETECTED — AB

## 2019-03-03 ENCOUNTER — Encounter: Payer: Self-pay | Admitting: Infectious Diseases

## 2019-03-03 ENCOUNTER — Telehealth: Payer: Self-pay | Admitting: Infectious Diseases

## 2019-03-03 DIAGNOSIS — Z6829 Body mass index (BMI) 29.0-29.9, adult: Secondary | ICD-10-CM | POA: Insufficient documentation

## 2019-03-03 DIAGNOSIS — E669 Obesity, unspecified: Secondary | ICD-10-CM | POA: Insufficient documentation

## 2019-03-04 ENCOUNTER — Inpatient Hospital Stay: Payer: Medicaid Other

## 2019-03-04 ENCOUNTER — Telehealth: Payer: Self-pay | Admitting: Infectious Diseases

## 2019-03-04 ENCOUNTER — Inpatient Hospital Stay: Payer: Medicaid Other | Admitting: Hospice and Palliative Medicine

## 2019-03-04 ENCOUNTER — Inpatient Hospital Stay: Payer: Medicaid Other | Admitting: Internal Medicine

## 2019-03-04 ENCOUNTER — Other Ambulatory Visit: Payer: Self-pay

## 2019-03-04 ENCOUNTER — Telehealth: Payer: Self-pay | Admitting: *Deleted

## 2019-03-04 NOTE — Telephone Encounter (Signed)
Called pt and told her that since she has tested positive for covid then we will move her first chemo appt from tom. To 2 weeks from today. She will see the appt changes in my chart. Also pt knows that if she becomes symptomatic she should seek PCp office because we will can't allow pt. In cancer center for poss. Exposure. She can call if she has any problems that we can handle over the phone. Pt is asymptomatic for symptoms of covid. I did call Dr. Janese Banks and she said to move it out 2 weeks

## 2019-03-04 NOTE — Telephone Encounter (Signed)
Called to discuss with patient about Covid symptoms and the use of bamlanivimab, a monoclonal antibody infusion for those with mild to moderate Covid symptoms and at a high risk of hospitalization.  Pt is qualified for this infusion at the [facility] infusion center due to immuno-compromised however she is not having any symptoms at this time (5 days since last exposure from coworker).   She is not having any symptoms at this time and actually the only reason she tested is because her coworker whom she worked closely with last week that went to the ER and was found to be (+).   Last week she had sinus symptoms for a short period of time but this cleared up on it's own.   Recommended to hold chemo until 12/30 during 14 day monitoring since last exposure to her (+) co-worker. Also recommended that her boyfriend whom she was with recently quarantine x 14 days from their last day together also. If he were to test would recommend going 5d after their last day together as I worry about false sense of security with negative test if done too soon.   She will contact me should she develop symptoms so we may arrange for infusion.   Janene Madeira, MSN, NP-C [facility] [place] [facility]

## 2019-03-05 ENCOUNTER — Inpatient Hospital Stay: Payer: Medicaid Other

## 2019-03-05 ENCOUNTER — Inpatient Hospital Stay: Payer: Medicaid Other | Admitting: Internal Medicine

## 2019-03-05 ENCOUNTER — Inpatient Hospital Stay: Payer: Medicaid Other | Admitting: Hospice and Palliative Medicine

## 2019-03-06 ENCOUNTER — Inpatient Hospital Stay: Payer: Medicaid Other

## 2019-03-06 NOTE — Telephone Encounter (Signed)
error 

## 2019-03-10 ENCOUNTER — Encounter: Payer: Self-pay | Admitting: *Deleted

## 2019-03-13 ENCOUNTER — Other Ambulatory Visit: Payer: Self-pay

## 2019-03-13 ENCOUNTER — Ambulatory Visit (INDEPENDENT_AMBULATORY_CARE_PROVIDER_SITE_OTHER): Payer: Medicaid Other | Admitting: Internal Medicine

## 2019-03-13 ENCOUNTER — Encounter: Payer: Self-pay | Admitting: Internal Medicine

## 2019-03-13 VITALS — Ht 63.0 in | Wt 164.0 lb

## 2019-03-13 DIAGNOSIS — U071 COVID-19: Secondary | ICD-10-CM | POA: Insufficient documentation

## 2019-03-13 DIAGNOSIS — Z170 Estrogen receptor positive status [ER+]: Secondary | ICD-10-CM

## 2019-03-13 DIAGNOSIS — C50212 Malignant neoplasm of upper-inner quadrant of left female breast: Secondary | ICD-10-CM | POA: Diagnosis not present

## 2019-03-13 DIAGNOSIS — M79645 Pain in left finger(s): Secondary | ICD-10-CM

## 2019-03-13 NOTE — Progress Notes (Signed)
Virtual Visit via Video Note  I connected with Leah [name]   on 03/13/19 at 10:45 AM EST by a video enabled telemedicine application and verified that I am speaking with the correct person using two identifiers.  Location patient: home Location provider:work or home office Persons participating in the virtual visit: patient, provider  I discussed the limitations of evaluation and management by telemedicine and the availability of in person appointments. The patient expressed understanding and agreed to proceed.   HPI: 1. Breast cancer new dx chemo will start 03/19/19  2. Left thumb pain 3-10/10 worse x 2 months tried cream, ice, heat w/o relief f/u with nNS next week 03/20/2018 will see if they can address if not will need hand surgery  3. covid 19 + 03/01/19 got from coworker at work feeling ok reviewed list of supplements    ROS: See pertinent positives and negatives per HPI.  Past Medical History:  Diagnosis Date  . Anxiety   . Asthma    as a child  . Carpal tunnel syndrome    R hand   . Colon polyps   . Depression   . Eosinophilic esophagitis    noted in [place] with GI path report 09/29/14   . Esophageal stricture    s/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 Edwin Cap   . Family history of breast cancer   . Family history of prostate cancer   . Herniated disc, cervical    s/p epidural injection  . History of kidney stones   . Hyperlipidemia   . Low back pain   . Migraines     Past Surgical History:  Procedure Laterality Date  . BREAST BIOPSY Left 12/25/2018   [place] BX, PATH PENDING  . BREAST LUMPECTOMY WITH NEEDLE LOCALIZATION AND AXILLARY SENTINEL LYMPH NODE BX Left 01/16/2019   Procedure: LEFT BREAST LUMPECTOMY WITH NEEDLE LOCALIZATION AND SENTINEL LYMPH NODE BIOPSY;  Surgeon: Jovita Kussmaul, MD;  Location: [facility] ORS;  Service: General;  Laterality: Left;  . CESAREAN SECTION    . COLONOSCOPY WITH PROPOFOL N/A 03/27/2017   Procedure: COLONOSCOPY WITH  PROPOFOL;  Surgeon: Jonathon Bellows, MD;  Location: [facility] ENDOSCOPY;  Service: Gastroenterology;  Laterality: N/A;  . ESOPHAGOGASTRODUODENOSCOPY    . ESOPHAGOGASTRODUODENOSCOPY (EGD) WITH PROPOFOL N/A 03/27/2017   Procedure: ESOPHAGOGASTRODUODENOSCOPY (EGD) WITH PROPOFOL WITH DILATION;  Surgeon: Jonathon Bellows, MD;  Location: [facility] ENDOSCOPY;  Service: Gastroenterology;  Laterality: N/A;  . PORTACATH PLACEMENT N/A 02/15/2019   Procedure: INSERTION PORT-A-CATH WITH POSSIBLE ULTRASOUND;  Surgeon: Jovita Kussmaul, MD;  Location: [facility] ORS;  Service: General;  Laterality: N/A;  . RE-EXCISION OF BREAST CANCER,SUPERIOR MARGINS Left 02/15/2019   Procedure: RE-EXCISION OF LEFT BREAST CANCER ANTERIOR MARGINS;  Surgeon: Jovita Kussmaul, MD;  Location: [facility] ORS;  Service: General;  Laterality: Left;  . THROAT SURGERY  2015  . UMBILICAL HERNIA REPAIR  2006    x 2   w mesh per pt    Family History  Problem Relation Age of Onset  . Breast cancer Mother        dx late 52s  . Thyroid disease Mother   . Colon polyps Mother   . Prostate cancer Father        dx 97  . Breast cancer Maternal Grandmother        dx late 59s  . 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 As of 02/26/17  Divorced.  Former smoker 20+ years quit in 1997 then started again for 5 years and quit 10-12 years ago as of 03/08/17      Current Outpatient Medications:  .  acetaminophen (TYLENOL) 500 MG tablet, Take 500-1,000 mg by mouth every 6 (six) hours as needed (for pain.)., Disp: , Rfl:  .  Cholecalciferol (VITAMIN D-3) 125 MCG (5000 UT) TABS, Take 5,000 Units by mouth daily., Disp: , Rfl:  .  Cyanocobalamin (VITAMIN B-12 PO), Take 1 tablet by mouth daily., Disp: , Rfl:  .  dexamethasone (DECADRON) 4 MG tablet, Take 2 tablets by mouth once a day on the day after chemotherapy and then take  2 tablets two times a day for 2 days. Take with food., Disp: 30 tablet, Rfl: 1 .  fluticasone (FLONASE) 50 MCG/ACT nasal spray, Place 2 sprays into both nostrils daily. Max 2 sprays (Patient taking differently: Place 2 sprays into both nostrils at bedtime. Max 2 sprays), Disp: 16 g, Rfl: 12 .  gabapentin (NEURONTIN) 300 MG capsule, Take 300 mg by mouth at bedtime., Disp: , Rfl:  .  lidocaine-prilocaine (EMLA) cream, Apply to affected area once, Disp: 30 g, Rfl: 3 .  meclizine (ANTIVERT) 12.5 MG tablet, Take 1 tablet (12.5 mg total) by mouth 3 (three) times daily as needed for dizziness., Disp: 90 tablet, Rfl: 1 .  meloxicam (MOBIC) 15 MG tablet, Take 15 mg by mouth daily., Disp: , Rfl:  .  methocarbamol (ROBAXIN) 750 MG tablet, Take 1 tablet (750 mg total) by mouth every 6 (six) hours as needed for muscle spasms., Disp: 120 tablet, Rfl: 5 .  montelukast (SINGULAIR) 10 MG tablet, Take 1 tablet (10 mg total) by mouth at bedtime., Disp: 90 tablet, Rfl: 3 .  omeprazole (PRILOSEC) 40 MG capsule, Take 40 mg by mouth every evening. , Disp: , Rfl:  .  ondansetron (ZOFRAN) 8 MG tablet, Take 1 tablet (8 mg total) by mouth 2 (two) times daily as needed. Start on the third day after chemotherapy., Disp: 30 tablet, Rfl: 1 .  prochlorperazine (COMPAZINE) 10 MG tablet, Take 1 tablet (10 mg total) by mouth every 6 (six) hours as needed (Nausea or vomiting)., Disp: 30 tablet, Rfl: 1 .  tiZANidine (ZANAFLEX) 4 MG tablet, Take 4 mg by mouth at bedtime. , Disp: , Rfl:  .  traMADol (ULTRAM) 50 MG tablet, Take 1-2 tablets (50-100 mg total) by mouth every 6 (six) hours as needed., Disp: 20 tablet, Rfl: 0 .  traMADol (ULTRAM) 50 MG tablet, Take 1-2 tablets (50-100 mg total) by mouth every 6 (six) hours as needed., Disp: 20 tablet, Rfl: 0 .  traZODone (DESYREL) 50 MG tablet, Take 1-1.5 tablets (50-75 mg total) by mouth at bedtime as needed for sleep. (Patient taking differently: Take 50 mg by mouth at bedtime as needed for  sleep. ), Disp: 135 tablet, Rfl: 4  EXAM:  VITALS per patient if applicable:  GENERAL: alert, oriented, appears well and in no acute distress  HEENT: atraumatic, conjunttiva clear, no obvious abnormalities on inspection of external nose and ears  NECK: normal movements of the head and neck  LUNGS: on inspection no signs of respiratory distress, breathing rate appears normal, no obvious gross SOB, gasping or wheezing  CV: no obvious cyanosis  MS: moves all visible extremities without noticeable abnormality  PSYCH/NEURO: pleasant and  cooperative, no obvious depression or anxiety, speech and thought processing grossly intact  ASSESSMENT AND PLAN:  Discussed the following assessment and plan:  COVID-19 virus infection rec supplements  Vitamin C 1000 mg daily  Quercetin 500 mg 2x per day  Vitamin D3 5000 IU daily  Zinc 100 mg daily    Malignant neoplasm of upper-inner quadrant of left breast in female, estrogen receptor positive ([place]) Chemo will do 03/19/19  F/u H/o   Pain of left thumb CTS/arthritis vs tenosynovitis  -consider NS or ortho hand pt to let me know   HM Had flu shot had 03/01/2019   Tdap12/31/19 rec hep B vaccinewill call back to sch rec MMR vaccine  EGD/colonoscopy had 03/27/17 Dr. Bailey Mech diverticulosis, serrated polyps neg EGD for eosinophillic esophagitis repeat colonoscopy due in 5years  -refilled PPI   rec D3 5000 IU daily   mammo 10/10/17 negativeFH breast cancer in mom and m gm in 71s.consider genetic testing in future' \mammogram'  had 10/12/18 needs left mammo and [place] had 12/18/18 abnormal + breast cancer/left   Pap1/22/19 h/o abnormal neg pap neg HPV Former smoker congratulated on quitting  NS saw 01/23/18 cervical spinal cord kyphosis, cervical myelopathy f/u in 4 months referral to pm&R for pain tx Dr. Remo Lipps cook   MRI 10/10/18 abnormal neurology does not think MS rec [facility]   CCS seen 01/04/19 stage 1 left breast  cancer ER+ Dr. Marlou Starks breast conservation surgery and sentinel node bx f/u with h/o after surgery   CC Dr. Manuella Ghazi to see when due to f/u neurology   -we discussed possible serious and likely etiologies, options for evaluation and workup, limitations of telemedicine visit vs in person visit, treatment, treatment risks and precautions. Pt prefers to treat via telemedicine empirically rather then risking or undertaking an in person visit at this moment. Patient agrees to seek prompt in person care if worsening, new symptoms arise, or if is not improving with treatment.   I discussed the assessment and treatment plan with the patient. The patient was provided an opportunity to ask questions and all were answered. The patient agreed with the plan and demonstrated an understanding of the instructions.   The patient was advised to call back or seek an in-person evaluation if the symptoms worsen or if the condition fails to improve as anticipated.  Time spent 15-20 minutes  Delorise Jackson, MD

## 2019-03-13 NOTE — Patient Instructions (Addendum)
Vitamin C 1000 mg daily  Quercetin 500 mg 2x per day  Vitamin D3 5000 IU daily  Zinc 100 mg daily   Let me know if hand surgeon referral needed after speaking to neurosurgery   De Quervain's Tenosynovitis  De Quervain's tenosynovitis is a condition that causes inflammation of the tendon on the thumb side of the wrist. Tendons are cords of tissue that connect bones to muscles. The tendons in the hand pass through a tunnel called a sheath. A slippery layer of tissue (synovium) lets the tendons move smoothly in the sheath. With de Quervain's tenosynovitis, the sheath swells or thickens, causing friction and pain. The condition is also called de Quervain's disease and de Quervain's syndrome. It occurs most often in women who are 69-80 years old. What are the causes? The exact cause of this condition is not known. It may be associated with overuse of the hand and wrist. What increases the risk? You are more likely to develop this condition if you:  Use your hands far more than normal, especially if you repeat certain movements that involve twisting your hand or using a tight grip.  Are pregnant.  Are a middle-aged woman.  Have rheumatoid arthritis.  Have diabetes. What are the signs or symptoms? The main symptom of this condition is pain on the thumb side of the wrist. The pain may get worse when you grasp something or turn your wrist. Other symptoms may include:  Pain that extends up the forearm.  Swelling of your wrist and hand.  Trouble moving the thumb and wrist.  A sensation of snapping in the wrist.  A bump filled with fluid (cyst) in the area of the pain. How is this diagnosed? This condition may be diagnosed based on:  Your symptoms and medical history.  A physical exam. During the exam, your health care provider may do a simple test Wynn Maudlin test) that involves pulling your thumb and wrist to see if this causes pain. You may also need to have an X-ray. How is this  treated? Treatment for this condition may include:  Avoiding any activity that causes pain and swelling.  Taking medicines. Anti-inflammatory medicines and corticosteroid injections may be used to reduce inflammation and relieve pain.  Wearing a splint.  Having surgery. This may be needed if other treatments do not work. Once the pain and swelling has gone down:  Physical therapy. This includes stretching and strengthening exercises.  Occupational therapy. This includes adjusting how you move your wrist. Follow these instructions at home: If you have a splint:  Wear the splint as told by your health care provider. Remove it only as told by your health care provider.  Loosen the splint if your fingers tingle, become numb, or turn cold and blue.  Keep the splint clean.  If the splint is not waterproof: ? Do not let it get wet. ? Cover it with a watertight covering when you take a bath or a shower. Managing pain, stiffness, and swelling   Avoid movements and activities that cause pain and swelling in the wrist area.  If directed, put ice on the painful area. This may be helpful after doing activities that involve the sore wrist. ? Put ice in a plastic bag. ? Place a towel between your skin and the bag. ? Leave the ice on for 20 minutes, 2-3 times a day.  Move your fingers often to avoid stiffness and to lessen swelling.  Raise (elevate) the injured area above the level  of your heart while you are sitting or lying down. General instructions  Return to your normal activities as told by your health care provider. Ask your health care provider what activities are safe for you.  Take over-the-counter and prescription medicines only as told by your health care provider.  Keep all follow-up visits as told by your health care provider. This is important. Contact a health care provider if:  Your pain medicine does not help.  Your pain gets worse.  You develop new  symptoms. Summary  De Quervain's tenosynovitis is a condition that causes inflammation of the tendon on the thumb side of the wrist.  The condition occurs most often in women who are 23-8 years old.  The exact cause of this condition is not known. It may be associated with overuse of the hand and wrist.  Treatment starts with avoiding activity that causes pain or swelling in the wrist area. Other treatment may include wearing a splint and taking medicine. Sometimes, surgery is needed. This information is not intended to replace advice given to you by your health care provider. Make sure you discuss any questions you have with your health care provider. Document Released: 11/23/2000 Document Revised: 08/31/2017 Document Reviewed: 02/06/2017 Elsevier Patient Education  2020 [employer].

## 2019-03-18 ENCOUNTER — Other Ambulatory Visit: Payer: Self-pay

## 2019-03-18 ENCOUNTER — Encounter: Payer: Self-pay | Admitting: Nurse Practitioner

## 2019-03-18 NOTE — Progress Notes (Signed)
Patient pre screened for appt. Patient has been exposed and tested positive for COVID on or about 12/15. She has been asymptomatic.  Care team notified.

## 2019-03-19 ENCOUNTER — Other Ambulatory Visit: Payer: Self-pay

## 2019-03-19 ENCOUNTER — Inpatient Hospital Stay: Payer: Medicaid Other | Attending: Oncology

## 2019-03-19 ENCOUNTER — Inpatient Hospital Stay (HOSPITAL_BASED_OUTPATIENT_CLINIC_OR_DEPARTMENT_OTHER): Payer: Medicaid Other | Admitting: Hospice and Palliative Medicine

## 2019-03-19 ENCOUNTER — Encounter: Payer: Self-pay | Admitting: Oncology

## 2019-03-19 ENCOUNTER — Inpatient Hospital Stay (HOSPITAL_BASED_OUTPATIENT_CLINIC_OR_DEPARTMENT_OTHER): Payer: Medicaid Other | Admitting: Oncology

## 2019-03-19 ENCOUNTER — Inpatient Hospital Stay: Payer: Medicaid Other

## 2019-03-19 VITALS — BP 142/85 | HR 86 | Temp 97.3°F | Ht 63.0 in | Wt 171.0 lb

## 2019-03-19 DIAGNOSIS — Z170 Estrogen receptor positive status [ER+]: Secondary | ICD-10-CM

## 2019-03-19 DIAGNOSIS — Z87891 Personal history of nicotine dependence: Secondary | ICD-10-CM | POA: Insufficient documentation

## 2019-03-19 DIAGNOSIS — Z79899 Other long term (current) drug therapy: Secondary | ICD-10-CM | POA: Diagnosis not present

## 2019-03-19 DIAGNOSIS — F329 Major depressive disorder, single episode, unspecified: Secondary | ICD-10-CM | POA: Diagnosis not present

## 2019-03-19 DIAGNOSIS — Z5111 Encounter for antineoplastic chemotherapy: Secondary | ICD-10-CM | POA: Diagnosis not present

## 2019-03-19 DIAGNOSIS — C50212 Malignant neoplasm of upper-inner quadrant of left female breast: Secondary | ICD-10-CM

## 2019-03-19 DIAGNOSIS — Z803 Family history of malignant neoplasm of breast: Secondary | ICD-10-CM | POA: Insufficient documentation

## 2019-03-19 DIAGNOSIS — K219 Gastro-esophageal reflux disease without esophagitis: Secondary | ICD-10-CM | POA: Diagnosis not present

## 2019-03-19 DIAGNOSIS — Z8616 Personal history of COVID-19: Secondary | ICD-10-CM | POA: Diagnosis not present

## 2019-03-19 DIAGNOSIS — Z515 Encounter for palliative care: Secondary | ICD-10-CM | POA: Insufficient documentation

## 2019-03-19 DIAGNOSIS — Z8349 Family history of other endocrine, nutritional and metabolic diseases: Secondary | ICD-10-CM | POA: Insufficient documentation

## 2019-03-19 DIAGNOSIS — F419 Anxiety disorder, unspecified: Secondary | ICD-10-CM | POA: Insufficient documentation

## 2019-03-19 DIAGNOSIS — K5900 Constipation, unspecified: Secondary | ICD-10-CM | POA: Insufficient documentation

## 2019-03-19 DIAGNOSIS — Z791 Long term (current) use of non-steroidal anti-inflammatories (NSAID): Secondary | ICD-10-CM | POA: Diagnosis not present

## 2019-03-19 DIAGNOSIS — Z7952 Long term (current) use of systemic steroids: Secondary | ICD-10-CM | POA: Diagnosis not present

## 2019-03-19 DIAGNOSIS — Z7189 Other specified counseling: Secondary | ICD-10-CM | POA: Diagnosis not present

## 2019-03-19 LAB — CBC WITH DIFFERENTIAL/PLATELET
Abs Immature Granulocytes: 0.03 10*3/uL (ref 0.00–0.07)
Basophils Absolute: 0.1 10*3/uL (ref 0.0–0.1)
Basophils Relative: 1 %
Eosinophils Absolute: 0.3 10*3/uL (ref 0.0–0.5)
Eosinophils Relative: 4 %
HCT: 42.1 % (ref 36.0–46.0)
Hemoglobin: 14.6 g/dL (ref 12.0–15.0)
Immature Granulocytes: 0 %
Lymphocytes Relative: 22 %
Lymphs Abs: 1.7 10*3/uL (ref 0.7–4.0)
MCH: 32.1 pg (ref 26.0–34.0)
MCHC: 34.7 g/dL (ref 30.0–36.0)
MCV: 92.5 fL (ref 80.0–100.0)
Monocytes Absolute: 0.8 10*3/uL (ref 0.1–1.0)
Monocytes Relative: 11 %
Neutro Abs: 4.7 10*3/uL (ref 1.7–7.7)
Neutrophils Relative %: 62 %
Platelets: 332 10*3/uL (ref 150–400)
RBC: 4.55 MIL/uL (ref 3.87–5.11)
RDW: 11.8 % (ref 11.5–15.5)
WBC: 7.5 10*3/uL (ref 4.0–10.5)
nRBC: 0 % (ref 0.0–0.2)

## 2019-03-19 LAB — COMPREHENSIVE METABOLIC PANEL
ALT: 19 U/L (ref 0–44)
AST: 22 U/L (ref 15–41)
Albumin: 3.7 g/dL (ref 3.5–5.0)
Alkaline Phosphatase: 56 U/L (ref 38–126)
Anion gap: 9 (ref 5–15)
BUN: 14 mg/dL (ref 6–20)
CO2: 23 mmol/L (ref 22–32)
Calcium: 9 mg/dL (ref 8.9–10.3)
Chloride: 106 mmol/L (ref 98–111)
Creatinine, Ser: 0.65 mg/dL (ref 0.44–1.00)
GFR calc Af Amer: >60 mL/min (ref >60)
GFR calc non Af Amer: >60 mL/min (ref >60)
Glucose, Bld: 108 mg/dL — ABNORMAL HIGH (ref 70–99)
Potassium: 3.5 mmol/L (ref 3.5–5.1)
Sodium: 138 mmol/L (ref 135–145)
Total Bilirubin: 0.8 mg/dL (ref 0.3–1.2)
Total Protein: 7.1 g/dL (ref 6.5–8.1)

## 2019-03-19 MED ORDER — HEPARIN SOD (PORK) LOCK FLUSH 100 UNIT/ML IV SOLN
500.0000 [IU] | Freq: Once | INTRAVENOUS | Status: AC
  Administered 2019-03-19: 500 [IU] via INTRAVENOUS
  Filled 2019-03-19: qty 5

## 2019-03-19 MED ORDER — SODIUM CHLORIDE 0.9 % IV SOLN
Freq: Once | INTRAVENOUS | Status: AC
  Filled 2019-03-19: qty 250

## 2019-03-19 MED ORDER — PALONOSETRON HCL INJECTION 0.25 MG/5ML
0.2500 mg | Freq: Once | INTRAVENOUS | Status: AC
  Administered 2019-03-19: 0.25 mg via INTRAVENOUS
  Filled 2019-03-19: qty 5

## 2019-03-19 MED ORDER — SODIUM CHLORIDE 0.9 % IV SOLN
600.0000 mg/m2 | Freq: Once | INTRAVENOUS | Status: AC
  Administered 2019-03-19: 1120 mg via INTRAVENOUS
  Filled 2019-03-19: qty 50

## 2019-03-19 MED ORDER — SODIUM CHLORIDE 0.9% FLUSH
10.0000 mL | Freq: Once | INTRAVENOUS | Status: AC
  Administered 2019-03-19: 10 mL via INTRAVENOUS
  Filled 2019-03-19: qty 10

## 2019-03-19 MED ORDER — HEPARIN SOD (PORK) LOCK FLUSH 100 UNIT/ML IV SOLN
INTRAVENOUS | Status: AC
  Filled 2019-03-19: qty 5

## 2019-03-19 MED ORDER — SODIUM CHLORIDE 0.9 % IV SOLN
150.0000 mg | Freq: Once | INTRAVENOUS | Status: AC
  Administered 2019-03-19: 150 mg via INTRAVENOUS
  Filled 2019-03-19: qty 5

## 2019-03-19 MED ORDER — HEPARIN SOD (PORK) LOCK FLUSH 100 UNIT/ML IV SOLN
500.0000 [IU] | Freq: Once | INTRAVENOUS | Status: DC | PRN
  Filled 2019-03-19: qty 5

## 2019-03-19 MED ORDER — PEGFILGRASTIM 6 MG/0.6ML ~~LOC~~ PSKT
6.0000 mg | PREFILLED_SYRINGE | Freq: Once | SUBCUTANEOUS | Status: AC
  Administered 2019-03-19: 6 mg via SUBCUTANEOUS
  Filled 2019-03-19: qty 0.6

## 2019-03-19 MED ORDER — SODIUM CHLORIDE 0.9 % IV SOLN
10.0000 mg | Freq: Once | INTRAVENOUS | Status: AC
  Administered 2019-03-19: 10 mg via INTRAVENOUS
  Filled 2019-03-19: qty 10

## 2019-03-19 MED ORDER — DOXORUBICIN HCL CHEMO IV INJECTION 2 MG/ML
60.0000 mg/m2 | Freq: Once | INTRAVENOUS | Status: AC
  Administered 2019-03-19: 112 mg via INTRAVENOUS
  Filled 2019-03-19: qty 56

## 2019-03-19 NOTE — Progress Notes (Signed)
[place]  [phone] [phone]   Name: Leah [name] Date: 03/19/2019 MRN: [id number]  DOB: 1966-01-02  Patient Care Team: McLean-Scocuzza, Nino Glow, MD as PCP - General (Internal Medicine) Rico Junker, RN as Registered Nurse    REASON FOR CONSULTATION: Leah [name] is a 54 y.o. 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  Patient was referred to palliative care to help address advanced care planning.  SOCIAL HISTORY:     reports that she quit smoking about 12 years ago. She has a 20.00 pack-year smoking history. She has never used smokeless tobacco. She reports current alcohol use of about 2.0 - 3.0 standard drinks of alcohol per week. She reports that she does not use drugs.   Patient is unmarried.  She lives at home with her cats.  She has 2 daughters in [place].  Patient currently works in a warehouse.  ADVANCE DIRECTIVES:  Does not have  CODE STATUS:   PAST MEDICAL HISTORY: Past Medical History:  Diagnosis Date  . Anxiety   . Asthma    as a child  . Carpal tunnel syndrome    R hand   . Colon polyps   . Depression   . Eosinophilic esophagitis    noted in [place] with GI path report 09/29/14   . Esophageal stricture    s/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 Edwin Cap   . Family history of breast cancer   . Family history of prostate cancer   . Herniated disc, cervical    s/p epidural injection  . History of kidney stones   . Hyperlipidemia   . Low back pain   . Migraines     PAST SURGICAL HISTORY:  Past Surgical History:  Procedure Laterality Date  . BREAST BIOPSY Left 12/25/2018   [place] BX, PATH PENDING  . BREAST LUMPECTOMY WITH NEEDLE LOCALIZATION AND AXILLARY SENTINEL LYMPH  NODE BX Left 01/16/2019   Procedure: LEFT BREAST LUMPECTOMY WITH NEEDLE LOCALIZATION AND SENTINEL LYMPH NODE BIOPSY;  Surgeon: Jovita Kussmaul, MD;  Location: [facility] ORS;  Service: General;  Laterality: Left;  . CESAREAN SECTION    . COLONOSCOPY WITH PROPOFOL N/A 03/27/2017   Procedure: COLONOSCOPY WITH PROPOFOL;  Surgeon: Jonathon Bellows, MD;  [facility]: [facility] ENDOSCOPY;  Service: Gastroenterology;  Laterality: N/A;  . ESOPHAGOGASTRODUODENOSCOPY    . ESOPHAGOGASTRODUODENOSCOPY (EGD) WITH PROPOFOL N/A 03/27/2017   Procedure: ESOPHAGOGASTRODUODENOSCOPY (EGD) WITH PROPOFOL WITH DILATION;  Surgeon: Jonathon Bellows, MD;  Location: [facility] ENDOSCOPY;  Service: Gastroenterology;  Laterality: N/A;  . PORTACATH PLACEMENT N/A 02/15/2019   Procedure: INSERTION PORT-A-CATH WITH POSSIBLE ULTRASOUND;  Surgeon: Jovita Kussmaul, MD;  Location: [facility] ORS;  Service: General;  Laterality: N/A;  . RE-EXCISION OF BREAST CANCER,SUPERIOR MARGINS Left 02/15/2019   Procedure: RE-EXCISION OF LEFT BREAST CANCER ANTERIOR MARGINS;  Surgeon: Jovita Kussmaul, MD;  Location: [facility] ORS;  Service: General;  Laterality: Left;  . THROAT SURGERY  2015  . UMBILICAL HERNIA REPAIR  2006    x 2   w mesh per pt    HEMATOLOGY/ONCOLOGY HISTORY:  Oncology History  Breast cancer ([place])  12/28/2018 Initial Diagnosis   Breast cancer ([place])   01/03/2019 Cancer Staging   Staging form: Breast, AJCC 8th Edition - Clinical stage from 01/03/2019: Stage IA (cT1c, cN0, cM0, G2, ER+,  PR+, HER2-) - Signed by Sindy Guadeloupe, MD on 01/03/2019   01/24/2019 Cancer Staging   Staging form: Breast, AJCC 8th Edition - Pathologic stage from 01/24/2019: Stage IA (pT1b, pN1a, cM0, G1, ER+, PR+, HER2-) - Signed by Sindy Guadeloupe, MD on 01/25/2019    Genetic Testing   No pathogenic variants identified. VUS in BARD1 called c.1672T>C (p.Ser558Pro) identified on the [facility] Common Hereditary Cancers Panel. The report date is 01/29/2019.  The Common Hereditary Cancers Panel offered  by [facility] includes sequencing and/or deletion duplication testing of the following 47 genes: APC, ATM, AXIN2, BARD1, BMPR1A, BRCA1, BRCA2, BRIP1, CDH1, CDKN2A (p14ARF), CDKN2A (p16INK4a), CKD4, CHEK2, CTNNA1, DICER1, EPCAM (Deletion/duplication testing only), GREM1 (promoter region deletion/duplication testing only), KIT, MEN1, MLH1, MSH2, MSH3, MSH6, MUTYH, NBN, NF1, NHTL1, PALB2, PDGFRA, PMS2, POLD1, POLE, PTEN, RAD50, RAD51C, RAD51C, SDHB, SDHC, SDHD, SMAD4, SMARCA4. STK11, TP53, TSC1, TSC2, and VHL.  The following genes were evaluated for sequence changes only: SDHA and HOXB13 c.251G>A variant only.   03/19/2019 -  Chemotherapy   The patient had DOXOrubicin (ADRIAMYCIN) chemo injection 112 mg, 60 mg/m2 = 112 mg, Intravenous,  Once, 1 of 4 cycles Administration: 112 mg (03/19/2019) palonosetron (ALOXI) injection 0.25 mg, 0.25 mg, Intravenous,  Once, 1 of 4 cycles Administration: 0.25 mg (03/19/2019) pegfilgrastim (NEULASTA ONPRO KIT) injection 6 mg, 6 mg, Subcutaneous, Once, 1 of 4 cycles cyclophosphamide (CYTOXAN) 1,120 mg in sodium chloride 0.9 % 250 mL chemo infusion, 600 mg/m2 = 1,120 mg, Intravenous,  Once, 1 of 4 cycles Administration: 1,120 mg (03/19/2019) PACLitaxel (TAXOL) 150 mg in sodium chloride 0.9 % 250 mL chemo infusion (</= 19m/m2), 80 mg/m2, Intravenous,  Once, 0 of 12 cycles fosaprepitant (EMEND) 150 mg in sodium chloride 0.9 % 145 mL IVPB, 150 mg, Intravenous,  Once, 1 of 4 cycles Administration: 150 mg (03/19/2019)  for chemotherapy treatment.      ALLERGIES:  is allergic to hydrocodone-acetaminophen; lactose intolerance (gi); penicillins; and sulfa antibiotics.  MEDICATIONS:  Current Outpatient Medications  Medication Sig Dispense Refill  . acetaminophen (TYLENOL) 500 MG tablet Take 500-1,000 mg by mouth every 6 (six) hours as needed (for pain.).    .Marland KitchenCholecalciferol (VITAMIN D-3) 125 MCG (5000 UT) TABS Take 5,000 Units by mouth daily.    . Cyanocobalamin (VITAMIN B-12 PO)  Take 1 tablet by mouth daily.    .Marland Kitchendexamethasone (DECADRON) 4 MG tablet Take 2 tablets by mouth once a day on the day after chemotherapy and then take 2 tablets two times a day for 2 days. Take with food. 30 tablet 1  . fluticasone (FLONASE) 50 MCG/ACT nasal spray Place 2 sprays into both nostrils daily. Max 2 sprays (Patient taking differently: Place 2 sprays into both nostrils at bedtime. Max 2 sprays) 16 g 12  . gabapentin (NEURONTIN) 300 MG capsule Take 300 mg by mouth at bedtime.    . lidocaine-prilocaine (EMLA) cream Apply to affected area once 30 g 3  . meclizine (ANTIVERT) 12.5 MG tablet Take 1 tablet (12.5 mg total) by mouth 3 (three) times daily as needed for dizziness. 90 tablet 1  . meloxicam (MOBIC) 15 MG tablet Take 15 mg by mouth daily.    . methocarbamol (ROBAXIN) 750 MG tablet Take 1 tablet (750 mg total) by mouth every 6 (six) hours as needed for muscle spasms. 120 tablet 5  . montelukast (SINGULAIR) 10 MG tablet Take 1 tablet (10 mg total) by mouth at bedtime. 90 tablet 3  . omeprazole (PRILOSEC) 40 MG capsule  Take 40 mg by mouth every evening.     . ondansetron (ZOFRAN) 8 MG tablet Take 1 tablet (8 mg total) by mouth 2 (two) times daily as needed. Start on the third day after chemotherapy. 30 tablet 1  . prochlorperazine (COMPAZINE) 10 MG tablet Take 1 tablet (10 mg total) by mouth every 6 (six) hours as needed (Nausea or vomiting). 30 tablet 1  . tiZANidine (ZANAFLEX) 4 MG tablet Take 4 mg by mouth at bedtime.     . traMADol (ULTRAM) 50 MG tablet Take 1-2 tablets (50-100 mg total) by mouth every 6 (six) hours as needed. 20 tablet 0  . traMADol (ULTRAM) 50 MG tablet Take 1-2 tablets (50-100 mg total) by mouth every 6 (six) hours as needed. 20 tablet 0  . traZODone (DESYREL) 50 MG tablet Take 1-1.5 tablets (50-75 mg total) by mouth at bedtime as needed for sleep. (Patient taking differently: Take 50 mg by mouth at bedtime as needed for sleep. ) 135 tablet 4   No current  facility-administered medications for this visit.   Facility-Administered Medications Ordered in Other Visits  Medication Dose Route Frequency Provider Last Rate Last Admin  . heparin lock flush 100 unit/mL  500 Units Intracatheter Once PRN Sindy Guadeloupe, MD        VITAL SIGNS: There were no vitals taken for this visit. There were no vitals filed for this visit.  Estimated body mass index is 30.29 kg/m as calculated from the following:   Height as of an earlier encounter on 03/19/19: '5\' 3"'  (1.6 m).   Weight as of an earlier encounter on 03/19/19: 171 lb (77.6 kg).  LABS: CBC:    Component Value Date/Time   WBC 7.5 03/19/2019 0854   HGB 14.6 03/19/2019 0854   HCT 42.1 03/19/2019 0854   PLT 332 03/19/2019 0854   MCV 92.5 03/19/2019 0854   NEUTROABS 4.7 03/19/2019 0854   LYMPHSABS 1.7 03/19/2019 0854   MONOABS 0.8 03/19/2019 0854   EOSABS 0.3 03/19/2019 0854   BASOSABS 0.1 03/19/2019 0854   Comprehensive Metabolic Panel:    Component Value Date/Time   NA 138 03/19/2019 0854   K 3.5 03/19/2019 0854   CL 106 03/19/2019 0854   CO2 23 03/19/2019 0854   BUN 14 03/19/2019 0854   CREATININE 0.65 03/19/2019 0854   CREATININE 0.66 10/03/2017 0837   GLUCOSE 108 (H) 03/19/2019 0854   CALCIUM 9.0 03/19/2019 0854   AST 22 03/19/2019 0854   ALT 19 03/19/2019 0854   ALKPHOS 56 03/19/2019 0854   BILITOT 0.8 03/19/2019 0854   PROT 7.1 03/19/2019 0854   ALBUMIN 3.7 03/19/2019 0854    RADIOGRAPHIC STUDIES: ECHOCARDIOGRAM COMPLETE  Result Date: 02/26/2019   ECHOCARDIOGRAM REPORT   Patient Name:   [name] Date of Exam: 02/26/2019 Medical Rec #:  [id number]           Height:       63.0 in Accession #:    [id number]          Weight:       169.8 lb Date of Birth:  [birth year]          BSA:          1.80 m Patient Age:    63 years            BP:           124/75 mmHg Patient Gender: F  HR:           79 bpm. Exam Location:  [facility] Procedure: 2D Echo, Cardiac Doppler  and Color Doppler Indications:     Chemotherapy evaluation V [id number]  History:         Patient has no prior history of Echocardiogram examinations.                  Migraines, hyperlipidemia.  Sonographer:     Sherrie Sport RDCS (AE) Referring Phys:  [id number] Weston Anna RAO Diagnosing Phys: Harrell Gave End MD IMPRESSIONS  1. Left ventricular ejection fraction, by visual estimation, is 50 to 55%. The left ventricle has low normal function. There is mildly increased left ventricular hypertrophy.  2. The left ventricle has no regional wall motion abnormalities.  3. Global right ventricle has normal systolic function.The right ventricular size is normal. No increase in right ventricular wall thickness.  4. Left atrial size was normal.  5. Right atrial size was normal.  6. The mitral valve is degenerative. Mild to moderate mitral valve regurgitation.  7. The tricuspid valve is grossly normal. Tricuspid valve regurgitation is trivial.  8. The aortic valve is tricuspid. Aortic valve regurgitation is not visualized. No evidence of aortic valve sclerosis or stenosis.  9. The pulmonic valve was grossly normal. Pulmonic valve regurgitation is trivial. 10. TR signal is inadequate for assessing pulmonary artery systolic pressure. 11. The inferior vena cava is normal in size with greater than 50% respiratory variability, suggesting right atrial pressure of 3 mmHg. 12. The interatrial septum was not well visualized. FINDINGS  Left Ventricle: Left ventricular ejection fraction, by visual estimation, is 50 to 55%. The left ventricle has low normal function. The left ventricle has no regional wall motion abnormalities. The left ventricular internal cavity size was the left ventricle is normal in size. There is mildly increased left ventricular hypertrophy. Left ventricular diastolic parameters were normal. Right Ventricle: The right ventricular size is normal. No increase in right ventricular wall thickness. Global RV systolic function is  has normal systolic function. Left Atrium: Left atrial size was normal in size. Right Atrium: Right atrial size was normal in size Pericardium: There is no evidence of pericardial effusion. Mitral Valve: The mitral valve is degenerative in appearance. There is mild thickening of the mitral valve leaflet(s). Mild to moderate mitral valve regurgitation. Tricuspid Valve: The tricuspid valve is grossly normal. Tricuspid valve regurgitation is trivial. Aortic Valve: The aortic valve is tricuspid. Aortic valve regurgitation is not visualized. The aortic valve is structurally normal, with no evidence of sclerosis or stenosis. Aortic valve mean gradient measures 3.0 mmHg. Aortic valve peak gradient measures 4.5 mmHg. Aortic valve area, by VTI measures 2.07 cm. Pulmonic Valve: The pulmonic valve was grossly normal. Pulmonic valve regurgitation is trivial. Pulmonic regurgitation is trivial. No evidence of pulmonic stenosis. Aorta: The aortic root is normal in size and structure. Pulmonary Artery: The pulmonary artery is not well seen. Venous: The inferior vena cava is normal in size with greater than 50% respiratory variability, suggesting right atrial pressure of 3 mmHg. IAS/Shunts: The interatrial septum was not well visualized.  LEFT VENTRICLE PLAX 2D LVIDd:         4.09 cm  Diastology LVIDs:         2.74 cm  LV e' lateral:   9.36 cm/s LV PW:         1.10 cm  LV E/e' lateral: 7.9 LV IVS:        0.86 cm  LV e' medial:    11.30 cm/s LVOT diam:     2.00 cm  LV E/e' medial:  6.5 LV SV:         46 ml LV SV Index:   24.40 LVOT Area:     3.14 cm  RIGHT VENTRICLE RV Basal diam:  2.86 cm RV S prime:     12.50 cm/s TAPSE (M-mode): 3.7 cm LEFT ATRIUM             Index       RIGHT ATRIUM           Index LA diam:        3.60 cm 2.00 cm/m  RA Area:     17.10 cm LA Vol (A2C):   58.3 ml 32.33 ml/m RA Volume:   45.50 ml  25.23 ml/m LA Vol (A4C):   41.2 ml 22.84 ml/m LA Biplane Vol: 50.2 ml 27.84 ml/m  AORTIC VALVE                    PULMONIC VALVE AV Area (Vmax):    2.06 cm    PV Vmax:        0.58 m/s AV Area (Vmean):   1.97 cm    PV Peak grad:   1.3 mmHg AV Area (VTI):     2.07 cm    RVOT Peak grad: 2 mmHg AV Vmax:           106.00 cm/s AV Vmean:          76.550 cm/s AV VTI:            0.199 m AV Peak Grad:      4.5 mmHg AV Mean Grad:      3.0 mmHg LVOT Vmax:         69.50 cm/s LVOT Vmean:        48.000 cm/s LVOT VTI:          0.131 m LVOT/AV VTI ratio: 0.66  AORTA Ao Root diam: 2.60 cm MITRAL VALVE MV Area (PHT): 4.21 cm             SHUNTS MV PHT:        52.20 msec           Systemic VTI:  0.13 m MV Decel Time: 180 msec             Systemic Diam: 2.00 cm MV E velocity: 73.70 cm/s 103 cm/s MV A velocity: 58.70 cm/s 70.3 cm/s MV E/A ratio:  1.26       1.5  Harrell Gave End MD Electronically signed by Nelva Bush MD Signature Date/Time: 02/26/2019/8:22:19 PM    Final     PERFORMANCE STATUS (ECOG) : 0 - Asymptomatic  Review of Systems Unless otherwise noted, a complete review of systems is negative.  Physical Exam General: NAD, frail appearing, thin Pulmonary: Unlabored Extremities: no edema, no joint deformities Skin: no rashes Neurological: Grossly nonfocal  IMPRESSION: I met with patient in the infusion area.    Patient reports that she is doing reasonably well.  She denies any acute changes or concerns.  She denies any distressing symptoms.  She says at baseline, she lives at home and is independent with her own care.  She still drives and works.  Patient reports good support from her family, although her daughters live in [place].  We reviewed advance care planning documents today.  Patient took [facility] POA/living will documents home with her to complete.  PLAN: -  Continue current prescription treatment -ACP documents reviewed -RTC as needed   Patient expressed understanding and was in agreement with this plan. She also understands that She can call the clinic at any time with any questions, concerns, or  complaints.     Time Total: 15 minutes  Visit consisted of counseling and education dealing with the complex and emotionally intense issues of symptom management and palliative care in the setting of serious and potentially life-threatening illness.Greater than 50%  of this time was spent counseling and coordinating care related to the above assessment and plan.  Signed by: Altha Harm, PhD, NP-C

## 2019-03-19 NOTE — Progress Notes (Signed)
Hematology/Oncology Consult note [facility]  [phone] [phone]  Patient Care Team: McLean-Scocuzza, Nino Glow, MD as PCP - General (Internal Medicine) Rico Junker, RN as Registered Nurse   Name of the patient: Leah [name]  [id number]  May 31, 1965   Date of visit: 03/19/19  Diagnosis- pathological prognostic stage Ia invasive mammary carcinoma pT1b pN1 cM0 ER/PR positive HER-2/neu negative s/p lumpectomy   Chief complaint/ Reason for visit-on treatment assessment prior to cycle 1 of adjuvant AC chemotherapy  Heme/Onc history: Patient is a 54 year old postmenopausal female G2 P2 L2 who recently underwent screening bilateral mammogram which showed area of concern in her left breast. This was followed by an ultrasound and Core biopsy. Ultrasound showed 1 x 1.1 x 0.7 cm hypoechoic mass at the 10:30 position of the left breast. Left axilla appeared normal. 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  Final pathology showed 9 mm grade 1 invasive mammary carcinoma1 out of 2 lymph nodes involved with macro metastatic carcinoma 3.5 mm. Anterior margin was focally positive for which patient underwent reexcision surgery with negative [id number] (sn)  MammaPrint score came back as high risk with a risk of recurrence of 29% at 10 years without chemotherapy   Interval history-patient was tested positive for Covid 2 weeks ago after known exposure.  She remained asymptomatic and has recovered well.  He denies any complaints at this time  ECOG PS- 0 Pain scale- 0 Opioid associated constipation- no  Review of systems- Review of Systems  Constitutional: Negative for chills, fever, malaise/fatigue and weight loss.  HENT: Negative for congestion, ear discharge and nosebleeds.   Eyes: Negative for blurred vision.  Respiratory: Negative for cough, hemoptysis, sputum  production, shortness of breath and wheezing.   Cardiovascular: Negative for chest pain, palpitations, orthopnea and claudication.  Gastrointestinal: Negative for abdominal pain, blood in stool, constipation, diarrhea, heartburn, melena, nausea and vomiting.  Genitourinary: Negative for dysuria, flank pain, frequency, hematuria and urgency.  Musculoskeletal: Negative for back pain, joint pain and myalgias.  Skin: Negative for rash.  Neurological: Negative for dizziness, tingling, focal weakness, seizures, weakness and headaches.  Endo/Heme/Allergies: Does not bruise/bleed easily.  Psychiatric/Behavioral: Negative for depression and suicidal ideas. The patient does not have insomnia.       Allergies  Allergen Reactions  . Hydrocodone-Acetaminophen Hives    All over her body.  . Lactose Intolerance (Gi) Other (See Comments)    Bloating and GI upset  . Penicillins Rash    Did it involve swelling of the face/tongue/throat, SOB, or low BP? Unknown Did it involve sudden or severe rash/hives, skin peeling, or any reaction on the inside of your mouth or nose? Yes Did you need to seek medical attention at a hospital or doctor's office? Unknown When did it last happen? Childhood reaction. If all above answers are "NO", may proceed with cephalosporin use.   . Sulfa Antibiotics Rash    Full body rash      Past Medical History:  Diagnosis Date  . Anxiety   . Asthma    as a child  . Carpal tunnel syndrome    R hand   . Colon polyps   . Depression   . Eosinophilic esophagitis    noted in [place] with GI path report 09/29/14   . Esophageal stricture    s/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 Edwin Cap   . Family history of  breast cancer   . Family history of prostate cancer   . Herniated disc, cervical    s/p epidural injection  . History of kidney stones   . Hyperlipidemia   . Low back pain   . Migraines      Past Surgical History:  Procedure  Laterality Date  . BREAST BIOPSY Left 12/25/2018   [place] BX, PATH PENDING  . BREAST LUMPECTOMY WITH NEEDLE LOCALIZATION AND AXILLARY SENTINEL LYMPH NODE BX Left 01/16/2019   Procedure: LEFT BREAST LUMPECTOMY WITH NEEDLE LOCALIZATION AND SENTINEL LYMPH NODE BIOPSY;  Surgeon: Jovita Kussmaul, MD;  Location: [facility] ORS;  Service: General;  Laterality: Left;  . CESAREAN SECTION    . COLONOSCOPY WITH PROPOFOL N/A 03/27/2017   Procedure: COLONOSCOPY WITH PROPOFOL;  Surgeon: Jonathon Bellows, MD;  Location: [facility] ENDOSCOPY;  Service: Gastroenterology;  Laterality: N/A;  . ESOPHAGOGASTRODUODENOSCOPY    . ESOPHAGOGASTRODUODENOSCOPY (EGD) WITH PROPOFOL N/A 03/27/2017   Procedure: ESOPHAGOGASTRODUODENOSCOPY (EGD) WITH PROPOFOL WITH DILATION;  Surgeon: Jonathon Bellows, MD;  Location: [facility] ENDOSCOPY;  Service: Gastroenterology;  Laterality: N/A;  . PORTACATH PLACEMENT N/A 02/15/2019   Procedure: INSERTION PORT-A-CATH WITH POSSIBLE ULTRASOUND;  Surgeon: Jovita Kussmaul, MD;  Location: [facility] ORS;  Service: General;  Laterality: N/A;  . RE-EXCISION OF BREAST CANCER,SUPERIOR MARGINS Left 02/15/2019   Procedure: RE-EXCISION OF LEFT BREAST CANCER ANTERIOR MARGINS;  Surgeon: Jovita Kussmaul, MD;  Location: [facility] ORS;  Service: General;  Laterality: Left;  . THROAT SURGERY  2015  . UMBILICAL HERNIA REPAIR  2006    x 2   w mesh per pt    Social History   Socioeconomic History  . Marital status: Significant Other    Spouse name: Not on file  . Number of children: Not on file  . Years of education: Not on file  . Highest education level: Not on file  Occupational History  . Not on file  Tobacco Use  . Smoking status: Former Smoker    Packs/day: 1.00    Years: 20.00    Pack years: 20.00    Quit date: 06/02/2006    Years since quitting: 12.8  . Smokeless tobacco: Never Used  Substance and Sexual Activity  . Alcohol use: Yes    Alcohol/week: 2.0 - 3.0 standard drinks    Types: 2 - 3 Standard drinks or equivalent per week     Comment: socially  . Drug use: No  . Sexual activity: Yes    Partners: Male  Other Topics Concern  . 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 As of 02/26/17    Divorced.    Former smoker 20+ years quit in 1997 then started again for 5 years and quit 10-12 years ago as of 03/08/17    Social Determinants of Health   Financial Resource Strain:   . Difficulty of Paying Living Expenses: Not on file  Food Insecurity:   . Worried About Charity fundraiser in the Last Year: Not on file  . Ran Out of Food in the Last Year: Not on file  Transportation Needs:   . Lack of Transportation (Medical): Not on file  . Lack of Transportation (Non-Medical): Not on file  Physical Activity:   . Days of Exercise per Week: Not on file  . Minutes of Exercise per Session: Not on file  Stress:   . Feeling of Stress : Not on file  Social Connections:   . Frequency of Communication with Friends and Family: Not on file  . Frequency of Social Gatherings with Friends and Family: Not on file  . Attends Religious Services: Not on file  . Active Member of Clubs or Organizations: Not on file  . Attends Archivist Meetings: Not on file  . Marital Status: Not on file  Intimate Partner Violence:   . Fear of Current or Ex-Partner: Not on file  . Emotionally Abused: Not on file  . Physically Abused: Not on file  . Sexually Abused: Not on file    Family History  Problem Relation Age of Onset  . Breast cancer Mother        dx late 40s  . Thyroid disease Mother   . Colon polyps Mother   . Prostate cancer Father        dx 31  . Breast cancer Maternal Grandmother        dx late 39s  . Cancer Maternal Uncle        possibly, unsure type     Current Outpatient Medications:  .  acetaminophen (TYLENOL) 500 MG tablet, Take 500-1,000 mg by mouth every 6 (six) hours as needed  (for pain.)., Disp: , Rfl:  .  Cholecalciferol (VITAMIN D-3) 125 MCG (5000 UT) TABS, Take 5,000 Units by mouth daily., Disp: , Rfl:  .  Cyanocobalamin (VITAMIN B-12 PO), Take 1 tablet by mouth daily., Disp: , Rfl:  .  dexamethasone (DECADRON) 4 MG tablet, Take 2 tablets by mouth once a day on the day after chemotherapy and then take 2 tablets two times a day for 2 days. Take with food., Disp: 30 tablet, Rfl: 1 .  fluticasone (FLONASE) 50 MCG/ACT nasal spray, Place 2 sprays into both nostrils daily. Max 2 sprays (Patient taking differently: Place 2 sprays into both nostrils at bedtime. Max 2 sprays), Disp: 16 g, Rfl: 12 .  gabapentin (NEURONTIN) 300 MG capsule, Take 300 mg by mouth at bedtime., Disp: , Rfl:  .  lidocaine-prilocaine (EMLA) cream, Apply to affected area once, Disp: 30 g, Rfl: 3 .  meclizine (ANTIVERT) 12.5 MG tablet, Take 1 tablet (12.5 mg total) by mouth 3 (three) times daily as needed for dizziness., Disp: 90 tablet, Rfl: 1 .  meloxicam (MOBIC) 15 MG tablet, Take 15 mg by mouth daily., Disp: , Rfl:  .  methocarbamol (ROBAXIN) 750 MG tablet, Take 1 tablet (750 mg total) by mouth every 6 (six) hours as needed for muscle spasms., Disp: 120 tablet, Rfl: 5 .  montelukast (SINGULAIR) 10 MG tablet, Take 1 tablet (10 mg total) by mouth at bedtime., Disp: 90 tablet, Rfl: 3 .  omeprazole (PRILOSEC) 40 MG capsule, Take 40 mg by mouth every evening. , Disp: , Rfl:  .  ondansetron (ZOFRAN) 8 MG tablet, Take 1 tablet (8 mg total) by mouth 2 (two) times daily as needed. Start on the third day after chemotherapy., Disp: 30 tablet, Rfl: 1 .  prochlorperazine (COMPAZINE) 10 MG tablet, Take 1 tablet (10 mg total) by mouth every 6 (six) hours as needed (Nausea or vomiting)., Disp: 30 tablet, Rfl: 1 .  tiZANidine (ZANAFLEX) 4 MG tablet, Take 4 mg by mouth at bedtime. , Disp: , Rfl:  .  traMADol (ULTRAM) 50 MG tablet, Take 1-2 tablets (50-100 mg total) by mouth every 6 (six) hours as needed., Disp: 20  tablet, Rfl: 0 .  traMADol (  ULTRAM) 50 MG tablet, Take 1-2 tablets (50-100 mg total) by mouth every 6 (six) hours as needed., Disp: 20 tablet, Rfl: 0 .  traZODone (DESYREL) 50 MG tablet, Take 1-1.5 tablets (50-75 mg total) by mouth at bedtime as needed for sleep. (Patient taking differently: Take 50 mg by mouth at bedtime as needed for sleep. ), Disp: 135 tablet, Rfl: 4 No current facility-administered medications for this visit.  Facility-Administered Medications Ordered in Other Visits:  .  heparin lock flush 100 unit/mL, 500 Units, Intracatheter, Once PRN, Sindy Guadeloupe, MD  Physical exam:  Vitals:   03/19/19 0910 03/19/19 0913  BP: (!) 142/85 (!) 142/85  Pulse: 86 86  Temp:  (!) 97.3 F (36.3 C)  TempSrc:  Tympanic  Weight: 171 lb (77.6 kg) 171 lb (77.6 kg)  Height: 5' 3" (1.6 m) 5' 3" (1.6 m)   Physical Exam HENT:     Head: Normocephalic and atraumatic.  Eyes:     Pupils: Pupils are equal, round, and reactive to light.  Cardiovascular:     Rate and Rhythm: Normal rate and regular rhythm.     Heart sounds: Normal heart sounds.  Pulmonary:     Effort: Pulmonary effort is normal.     Breath sounds: Normal breath sounds.  Abdominal:     General: Bowel sounds are normal.     Palpations: Abdomen is soft.  Musculoskeletal:     Cervical back: Normal range of motion.  Skin:    General: Skin is warm and dry.  Neurological:     Mental Status: She is alert and oriented to person, place, and time.   Scar of lumpectomy and healed well.  No evidence of local infection.  CMP Latest Ref Rng & Units 03/19/2019  Glucose 70 - 99 mg/dL 108(H)  BUN 6 - 20 mg/dL 14  Creatinine 0.44 - 1.00 mg/dL 0.65  Sodium 135 - 145 mmol/L 138  Potassium 3.5 - 5.1 mmol/L 3.5  Chloride 98 - 111 mmol/L 106  CO2 22 - 32 mmol/L 23  Calcium 8.9 - 10.3 mg/dL 9.0  Total Protein 6.5 - 8.1 g/dL 7.1  Total Bilirubin 0.3 - 1.2 mg/dL 0.8  Alkaline Phos 38 - 126 U/L 56  AST 15 - 41 U/L 22  ALT 0 - 44 U/L 19     CBC Latest Ref Rng & Units 03/19/2019  WBC 4.0 - 10.5 K/uL 7.5  Hemoglobin 12.0 - 15.0 g/dL 14.6  Hematocrit 36.0 - 46.0 % 42.1  Platelets 150 - 400 K/uL 332    No images are attached to the encounter.  ECHOCARDIOGRAM COMPLETE  Result Date: 02/26/2019   ECHOCARDIOGRAM REPORT   Patient Name:   Leah [name] [facility] Date of Exam: 02/26/2019 Medical Rec #:  [id number]           Height:       63.0 in Accession #:    [id number]          Weight:       169.8 lb Date of Birth:  [birth year]          BSA:          1.80 m Patient Age:    49 years            BP:           124/75 mmHg Patient Gender: F                   HR:  79 bpm. Exam Location:  [facility] Procedure: 2D Echo, Cardiac Doppler and Color Doppler Indications:     Chemotherapy evaluation V [id number]  History:         Patient has no prior history of Echocardiogram examinations.                  Migraines, hyperlipidemia.  Sonographer:     Sherrie Sport RDCS (AE) Referring Phys:  [id number] Weston Anna  Diagnosing Phys: Harrell Gave End MD IMPRESSIONS  1. Left ventricular ejection fraction, by visual estimation, is 50 to 55%. The left ventricle has low normal function. There is mildly increased left ventricular hypertrophy.  2. The left ventricle has no regional wall motion abnormalities.  3. Global right ventricle has normal systolic function.The right ventricular size is normal. No increase in right ventricular wall thickness.  4. Left atrial size was normal.  5. Right atrial size was normal.  6. The mitral valve is degenerative. Mild to moderate mitral valve regurgitation.  7. The tricuspid valve is grossly normal. Tricuspid valve regurgitation is trivial.  8. The aortic valve is tricuspid. Aortic valve regurgitation is not visualized. No evidence of aortic valve sclerosis or stenosis.  9. The pulmonic valve was grossly normal. Pulmonic valve regurgitation is trivial. 10. TR signal is inadequate for assessing pulmonary artery systolic pressure. 11. The  inferior vena cava is normal in size with greater than 50% respiratory variability, suggesting right atrial pressure of 3 mmHg. 12. The interatrial septum was not well visualized. FINDINGS  Left Ventricle: Left ventricular ejection fraction, by visual estimation, is 50 to 55%. The left ventricle has low normal function. The left ventricle has no regional wall motion abnormalities. The left ventricular internal cavity size was the left ventricle is normal in size. There is mildly increased left ventricular hypertrophy. Left ventricular diastolic parameters were normal. Right Ventricle: The right ventricular size is normal. No increase in right ventricular wall thickness. Global RV systolic function is has normal systolic function. Left Atrium: Left atrial size was normal in size. Right Atrium: Right atrial size was normal in size Pericardium: There is no evidence of pericardial effusion. Mitral Valve: The mitral valve is degenerative in appearance. There is mild thickening of the mitral valve leaflet(s). Mild to moderate mitral valve regurgitation. Tricuspid Valve: The tricuspid valve is grossly normal. Tricuspid valve regurgitation is trivial. Aortic Valve: The aortic valve is tricuspid. Aortic valve regurgitation is not visualized. The aortic valve is structurally normal, with no evidence of sclerosis or stenosis. Aortic valve mean gradient measures 3.0 mmHg. Aortic valve peak gradient measures 4.5 mmHg. Aortic valve area, by VTI measures 2.07 cm. Pulmonic Valve: The pulmonic valve was grossly normal. Pulmonic valve regurgitation is trivial. Pulmonic regurgitation is trivial. No evidence of pulmonic stenosis. Aorta: The aortic root is normal in size and structure. Pulmonary Artery: The pulmonary artery is not well seen. Venous: The inferior vena cava is normal in size with greater than 50% respiratory variability, suggesting right atrial pressure of 3 mmHg. IAS/Shunts: The interatrial septum was not well  visualized.  LEFT VENTRICLE PLAX 2D LVIDd:         4.09 cm  Diastology LVIDs:         2.74 cm  LV e' lateral:   9.36 cm/s LV PW:         1.10 cm  LV E/e' lateral: 7.9 LV IVS:        0.86 cm  LV e' medial:    11.30 cm/s LVOT diam:  2.00 cm  LV E/e' medial:  6.5 LV SV:         46 ml LV SV Index:   24.40 LVOT Area:     3.14 cm  RIGHT VENTRICLE RV Basal diam:  2.86 cm RV S prime:     12.50 cm/s TAPSE (M-mode): 3.7 cm LEFT ATRIUM             Index       RIGHT ATRIUM           Index LA diam:        3.60 cm 2.00 cm/m  RA Area:     17.10 cm LA Vol (A2C):   58.3 ml 32.33 ml/m RA Volume:   45.50 ml  25.23 ml/m LA Vol (A4C):   41.2 ml 22.84 ml/m LA Biplane Vol: 50.2 ml 27.84 ml/m  AORTIC VALVE                   PULMONIC VALVE AV Area (Vmax):    2.06 cm    PV Vmax:        0.58 m/s AV Area (Vmean):   1.97 cm    PV Peak grad:   1.3 mmHg AV Area (VTI):     2.07 cm    RVOT Peak grad: 2 mmHg AV Vmax:           106.00 cm/s AV Vmean:          76.550 cm/s AV VTI:            0.199 m AV Peak Grad:      4.5 mmHg AV Mean Grad:      3.0 mmHg LVOT Vmax:         69.50 cm/s LVOT Vmean:        48.000 cm/s LVOT VTI:          0.131 m LVOT/AV VTI ratio: 0.66  AORTA Ao Root diam: 2.60 cm MITRAL VALVE MV Area (PHT): 4.21 cm             SHUNTS MV PHT:        52.20 msec           Systemic VTI:  0.13 m MV Decel Time: 180 msec             Systemic Diam: 2.00 cm MV E velocity: 73.70 cm/s 103 cm/s MV A velocity: 58.70 cm/s 70.3 cm/s MV E/A ratio:  1.26       1.5  Harrell Gave End MD Electronically signed by Nelva Bush MD Signature Date/Time: 02/26/2019/8:22:19 PM    Final      Assessment and plan- Patient is a 54 y.o. female with prior history of polycythemia now with newly diagnosed invasive mammary carcinoma pathologic prognostic stage Ia of the left breast pT1b pN1 acM0 ER/PR positive HER-2/neu negative.  She is s/p lumpectomy with high risk MammaPrint score.  She is here for on treatment assessment prior to cycle 1 of adjuvant  dose dense AC chemotherapy.    Counts okay to proceed with cycle 1 of adjuvant dose dense AC chemotherapy today with on pro-Neulasta support.  I will see her back in 2 weeks time for cycle 2.  Discussed risks and benefits of chemotherapy including all but not limited to nausea, vomiting, low blood counts, risk of infections and hospitalization.  Risk of cardiotoxicity associated with anthracyclines.  Treatment will be given with a curative intent.  Patient understands and agrees to proceed as planned.  Patient is  now 2 weeks after her Covid infection and has remained asymptomatic throughout.  It would be okay to proceed with chemotherapy at this time   Visit Diagnosis 1. Malignant neoplasm of upper-inner quadrant of left breast in female, estrogen receptor positive ([place])   2. Encounter for antineoplastic chemotherapy   3. History of COVID-19      Dr. Randa Evens, MD, MPH [facility] at [facility] [phone] 03/19/2019 2:58 PM

## 2019-03-19 NOTE — Progress Notes (Signed)
1057: Blood return noted before, every 3 cc during and after Adriamycin push.   1145: pt tolerated infusion well. Pt denies any concerns, questions, or complaints at this time. No s/s of distress noted. Pt educated to call clinic with any concerns or questions. Pt verbalizes understanding. Nuelasta On Pro written and verbal instructions given, pt verbalizes understanding. Pt stable at discharge.

## 2019-03-19 NOTE — Progress Notes (Signed)
Patient stated that she feels nervous but overall doing well.

## 2019-03-20 ENCOUNTER — Inpatient Hospital Stay: Payer: Medicaid Other

## 2019-03-20 ENCOUNTER — Telehealth: Payer: Self-pay

## 2019-03-20 ENCOUNTER — Encounter: Payer: Self-pay | Admitting: Oncology

## 2019-03-20 DIAGNOSIS — M47812 Spondylosis without myelopathy or radiculopathy, cervical region: Secondary | ICD-10-CM | POA: Diagnosis not present

## 2019-03-20 DIAGNOSIS — M5412 Radiculopathy, cervical region: Secondary | ICD-10-CM | POA: Diagnosis not present

## 2019-03-20 DIAGNOSIS — M5030 Other cervical disc degeneration, unspecified cervical region: Secondary | ICD-10-CM | POA: Diagnosis not present

## 2019-03-20 NOTE — Telephone Encounter (Signed)
Telephone call to patient for follow up after receiving first infusion yesterday.   Patient states infusion went great and the nurses in infusion were amazing.  States did feel a little nausea this morning but took anti nausea meds and it went completely away.  States drinking plenty of fluids.  States started taking Claritin last night and will continue for 3 more nights.  Encourage patient to call for any questions or concerns.

## 2019-03-21 ENCOUNTER — Ambulatory Visit (INDEPENDENT_AMBULATORY_CARE_PROVIDER_SITE_OTHER): Payer: Medicaid Other | Admitting: Psychology

## 2019-03-21 DIAGNOSIS — F418 Other specified anxiety disorders: Secondary | ICD-10-CM

## 2019-03-21 DIAGNOSIS — F3289 Other specified depressive episodes: Secondary | ICD-10-CM

## 2019-03-25 ENCOUNTER — Encounter: Payer: Self-pay | Admitting: Oncology

## 2019-03-25 ENCOUNTER — Other Ambulatory Visit: Payer: Self-pay | Admitting: Internal Medicine

## 2019-03-25 ENCOUNTER — Telehealth: Payer: Self-pay | Admitting: *Deleted

## 2019-03-25 DIAGNOSIS — R0981 Nasal congestion: Secondary | ICD-10-CM

## 2019-03-25 DIAGNOSIS — J309 Allergic rhinitis, unspecified: Secondary | ICD-10-CM

## 2019-03-25 MED ORDER — FLUTICASONE PROPIONATE 50 MCG/ACT NA SUSP: 2.0000 | Freq: Every day | NASAL | 12 refills | Status: DC

## 2019-03-25 MED ORDER — MONTELUKAST SODIUM 10 MG PO TABS: 10.0000 mg | ORAL_TABLET | Freq: Every day | ORAL | 3 refills | Status: DC

## 2019-03-25 NOTE — Telephone Encounter (Signed)
Left message on patient voicemail to return my call.

## 2019-03-25 NOTE — Telephone Encounter (Signed)
Pt called and Hassan Rowan the triage nurse got in touch with pt and told her what Janese Banks had suggested.

## 2019-03-25 NOTE — Telephone Encounter (Signed)
If she has tried miralax twice daily and senna twice daily, we can give her prescription for lactulose. Suspect nausea secondary to constipation. Avoid zofran for nausea. Use compazine instead

## 2019-03-25 NOTE — Telephone Encounter (Signed)
Patient called reporting that she has had severe constipation and nausea since Saturday and she has "tried everything" Please advise

## 2019-03-25 NOTE — Telephone Encounter (Signed)
Call returned frompt, she has not taken the Miralax, but will try Miralax and Senna twice a day and let me know if it f=does not help. Also advised to stop Ondansetron and take the Compazine for nausea and that the nause ais most likely from the constipation. She thanked me for calling.

## 2019-03-26 ENCOUNTER — Telehealth: Payer: Self-pay | Admitting: *Deleted

## 2019-03-26 NOTE — Telephone Encounter (Signed)
Patient called stating she is still having some constipation. She is now taking Miralax and Senna twice a day since yesterday. Please advise

## 2019-03-26 NOTE — Telephone Encounter (Signed)
Give her lactulose 30 ml 2 packets daily. Can increase to 3 or 4 if 2 is not helping

## 2019-03-26 NOTE — Telephone Encounter (Signed)
Patient was informed about Dr. Elroy Channel recommendation.

## 2019-03-27 ENCOUNTER — Encounter: Payer: Self-pay | Admitting: *Deleted

## 2019-03-27 NOTE — Progress Notes (Signed)
Talked to patient today.  States she is doing better today.  States she had a rough weekend after her chemo.  She does need financial assistance through [employer].  She is going to drop off her bills to me.  Obtained verbal consent to nominate her for [place] "boxes of hope."  She is going to call with any further questions or needs.

## 2019-03-28 ENCOUNTER — Encounter: Payer: Self-pay | Admitting: *Deleted

## 2019-04-02 ENCOUNTER — Inpatient Hospital Stay (HOSPITAL_BASED_OUTPATIENT_CLINIC_OR_DEPARTMENT_OTHER): Payer: Medicaid Other | Admitting: Oncology

## 2019-04-02 ENCOUNTER — Encounter: Payer: Self-pay | Admitting: Oncology

## 2019-04-02 ENCOUNTER — Other Ambulatory Visit: Payer: Self-pay

## 2019-04-02 ENCOUNTER — Inpatient Hospital Stay: Payer: Medicaid Other

## 2019-04-02 VITALS — BP 119/73 | HR 78 | Temp 97.7°F | Resp 16 | Wt 173.6 lb

## 2019-04-02 DIAGNOSIS — K5900 Constipation, unspecified: Secondary | ICD-10-CM

## 2019-04-02 DIAGNOSIS — Z5111 Encounter for antineoplastic chemotherapy: Secondary | ICD-10-CM

## 2019-04-02 DIAGNOSIS — Z170 Estrogen receptor positive status [ER+]: Secondary | ICD-10-CM

## 2019-04-02 DIAGNOSIS — C50212 Malignant neoplasm of upper-inner quadrant of left female breast: Secondary | ICD-10-CM

## 2019-04-02 LAB — COMPREHENSIVE METABOLIC PANEL
ALT: 22 U/L (ref 0–44)
AST: 20 U/L (ref 15–41)
Albumin: 3.5 g/dL (ref 3.5–5.0)
Alkaline Phosphatase: 64 U/L (ref 38–126)
Anion gap: 8 (ref 5–15)
BUN: 18 mg/dL (ref 6–20)
CO2: 24 mmol/L (ref 22–32)
Calcium: 8.6 mg/dL — ABNORMAL LOW (ref 8.9–10.3)
Chloride: 107 mmol/L (ref 98–111)
Creatinine, Ser: 0.44 mg/dL (ref 0.44–1.00)
GFR calc Af Amer: >60 mL/min (ref >60)
GFR calc non Af Amer: >60 mL/min (ref >60)
Glucose, Bld: 122 mg/dL — ABNORMAL HIGH (ref 70–99)
Potassium: 3.6 mmol/L (ref 3.5–5.1)
Sodium: 139 mmol/L (ref 135–145)
Total Bilirubin: 0.5 mg/dL (ref 0.3–1.2)
Total Protein: 6.9 g/dL (ref 6.5–8.1)

## 2019-04-02 LAB — CBC WITH DIFFERENTIAL/PLATELET
Abs Immature Granulocytes: 1.07 10*3/uL — ABNORMAL HIGH (ref 0.00–0.07)
Basophils Absolute: 0.2 10*3/uL — ABNORMAL HIGH (ref 0.0–0.1)
Basophils Relative: 2 %
Eosinophils Absolute: 0.1 10*3/uL (ref 0.0–0.5)
Eosinophils Relative: 1 %
HCT: 37.8 % (ref 36.0–46.0)
Hemoglobin: 13 g/dL (ref 12.0–15.0)
Immature Granulocytes: 10 %
Lymphocytes Relative: 16 %
Lymphs Abs: 1.7 10*3/uL (ref 0.7–4.0)
MCH: 32.2 pg (ref 26.0–34.0)
MCHC: 34.4 g/dL (ref 30.0–36.0)
MCV: 93.6 fL (ref 80.0–100.0)
Monocytes Absolute: 1 10*3/uL (ref 0.1–1.0)
Monocytes Relative: 9 %
Neutro Abs: 7 10*3/uL (ref 1.7–7.7)
Neutrophils Relative %: 62 %
Platelets: 366 10*3/uL (ref 150–400)
RBC: 4.04 MIL/uL (ref 3.87–5.11)
RDW: 11.5 % (ref 11.5–15.5)
WBC: 11 10*3/uL — ABNORMAL HIGH (ref 4.0–10.5)
nRBC: 0 % (ref 0.0–0.2)

## 2019-04-02 MED ORDER — SODIUM CHLORIDE 0.9 % IV SOLN
600.0000 mg/m2 | Freq: Once | INTRAVENOUS | Status: AC
  Administered 2019-04-02: 1120 mg via INTRAVENOUS
  Filled 2019-04-02: qty 50

## 2019-04-02 MED ORDER — HEPARIN SOD (PORK) LOCK FLUSH 100 UNIT/ML IV SOLN
500.0000 [IU] | Freq: Once | INTRAVENOUS | Status: AC | PRN
  Administered 2019-04-02: 500 [IU]
  Filled 2019-04-02: qty 5

## 2019-04-02 MED ORDER — PALONOSETRON HCL INJECTION 0.25 MG/5ML
0.2500 mg | Freq: Once | INTRAVENOUS | Status: AC
  Administered 2019-04-02: 0.25 mg via INTRAVENOUS
  Filled 2019-04-02: qty 5

## 2019-04-02 MED ORDER — DOXORUBICIN HCL CHEMO IV INJECTION 2 MG/ML
60.0000 mg/m2 | Freq: Once | INTRAVENOUS | Status: AC
  Administered 2019-04-02: 112 mg via INTRAVENOUS
  Filled 2019-04-02: qty 50

## 2019-04-02 MED ORDER — SODIUM CHLORIDE 0.9 % IV SOLN
10.0000 mg | Freq: Once | INTRAVENOUS | Status: AC
  Administered 2019-04-02: 10 mg via INTRAVENOUS
  Filled 2019-04-02: qty 10

## 2019-04-02 MED ORDER — SODIUM CHLORIDE 0.9% FLUSH
10.0000 mL | Freq: Once | INTRAVENOUS | Status: AC
  Administered 2019-04-02: 10 mL via INTRAVENOUS
  Filled 2019-04-02: qty 10

## 2019-04-02 MED ORDER — SODIUM CHLORIDE 0.9 % IV SOLN
Freq: Once | INTRAVENOUS | Status: AC
  Filled 2019-04-02: qty 250

## 2019-04-02 MED ORDER — PEGFILGRASTIM 6 MG/0.6ML ~~LOC~~ PSKT
6.0000 mg | PREFILLED_SYRINGE | Freq: Once | SUBCUTANEOUS | Status: AC
  Administered 2019-04-02: 6 mg via SUBCUTANEOUS
  Filled 2019-04-02: qty 0.6

## 2019-04-02 MED ORDER — SODIUM CHLORIDE 0.9 % IV SOLN
150.0000 mg | Freq: Once | INTRAVENOUS | Status: AC
  Administered 2019-04-02: 150 mg via INTRAVENOUS
  Filled 2019-04-02: qty 5

## 2019-04-02 NOTE — Progress Notes (Signed)
Blood return noted before, every 3 cc during and after Adriamycin push.   Pt tolerated infusion well. Pt and VS stable at discharge.

## 2019-04-02 NOTE — Progress Notes (Signed)
Had constipation last time, sores in her mouth and dry mouth-she used biotene and salt water rise. She had break out of acne on her face.

## 2019-04-04 NOTE — Progress Notes (Signed)
Hematology/Oncology Consult note [facility]  [phone] [phone]  Patient Care Team: McLean-Scocuzza, Nino Glow, MD as PCP - General (Internal Medicine) Rico Junker, RN as Registered Nurse   Name of the patient: Leah [name]  [id number]  [birth year]   Date of visit: 04/04/19  Diagnosis- pathological prognostic stage Ia invasive mammary carcinomapT1b pN1 cM0 ER/PR positive HER-2/neu negative s/p lumpectomy   Chief complaint/ Reason for visit-on treatment assessment prior to cycle 2 of adjuvant AC chemotherapy  Heme/Onc history: Patient is a 54 year old postmenopausal female G2 P2 L2 who recently underwent screening bilateral mammogram which showed area of concern in her left breast. This was followed by an ultrasound and Core biopsy. Ultrasound showed 1 x 1.1 x 0.7 cm hypoechoic mass at the 10:30 position of the left breast. Left axilla appeared normal. 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  Final pathology showed 9 mm grade 1 invasive mammary carcinoma1 out of 2 lymph nodes involved with macro metastatic carcinoma 3.5 mm. Anterior margin was focally positive for which patient underwent reexcision surgery with negative marginsPT1BPN1a (sn)  MammaPrint score came back as high risk with a risk of recurrence of 29% at 10 years without chemotherapy   Interval history-patient reports having terrible constipation after her first chemo cycle.  We recommended MiraLAX and Senokot which has helped her and now she is having regular bowel movements.  Otherwise tolerated treatment well.  She had a mild self-limited episode of nausea  ECOG PS- 0 Pain scale- 0   Review of systems- Review of Systems  Constitutional: Negative for chills, fever, malaise/fatigue and weight loss.  HENT: Negative for congestion, ear discharge and nosebleeds.   Eyes: Negative for  blurred vision.  Respiratory: Negative for cough, hemoptysis, sputum production, shortness of breath and wheezing.   Cardiovascular: Negative for chest pain, palpitations, orthopnea and claudication.  Gastrointestinal: Positive for constipation. Negative for abdominal pain, blood in stool, diarrhea, heartburn, melena, nausea and vomiting.  Genitourinary: Negative for dysuria, flank pain, frequency, hematuria and urgency.  Musculoskeletal: Negative for back pain, joint pain and myalgias.  Skin: Negative for rash.  Neurological: Negative for dizziness, tingling, focal weakness, seizures, weakness and headaches.  Endo/Heme/Allergies: Does not bruise/bleed easily.  Psychiatric/Behavioral: Negative for depression and suicidal ideas. The patient does not have insomnia.       Allergies  Allergen Reactions  . Hydrocodone-Acetaminophen Hives    All over her body.  . Lactose Intolerance (Gi) Other (See Comments)    Bloating and GI upset  . Penicillins Rash    Did it involve swelling of the face/tongue/throat, SOB, or low BP? Unknown Did it involve sudden or severe rash/hives, skin peeling, or any reaction on the inside of your mouth or nose? Yes Did you need to seek medical attention at a hospital or doctor's office? Unknown When did it last happen? Childhood reaction. If all above answers are "NO", may proceed with cephalosporin use.   . Sulfa Antibiotics Rash    Full body rash      Past Medical History:  Diagnosis Date  . Anxiety   . Asthma    as a child  . Breast cancer ([place])   . Carpal tunnel syndrome    R hand   . Colon polyps   . Depression   . Eosinophilic esophagitis    noted in [place] with GI path report 09/29/14   . Esophageal stricture  s/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 Edwin Cap   . Family history of breast cancer   . Family history of prostate cancer   . Herniated disc, cervical    s/p epidural injection  . History of kidney stones    . Hyperlipidemia   . Low back pain   . Migraines      Past Surgical History:  Procedure Laterality Date  . BREAST BIOPSY Left 12/25/2018   [place] BX, PATH PENDING  . BREAST LUMPECTOMY WITH NEEDLE LOCALIZATION AND AXILLARY SENTINEL LYMPH NODE BX Left 01/16/2019   Procedure: LEFT BREAST LUMPECTOMY WITH NEEDLE LOCALIZATION AND SENTINEL LYMPH NODE BIOPSY;  Surgeon: Jovita Kussmaul, MD;  Location: [facility] ORS;  Service: General;  Laterality: Left;  . CESAREAN SECTION    . COLONOSCOPY WITH PROPOFOL N/A 03/27/2017   Procedure: COLONOSCOPY WITH PROPOFOL;  Surgeon: Jonathon Bellows, MD;  Location: [facility] ENDOSCOPY;  Service: Gastroenterology;  Laterality: N/A;  . ESOPHAGOGASTRODUODENOSCOPY    . ESOPHAGOGASTRODUODENOSCOPY (EGD) WITH PROPOFOL N/A 03/27/2017   Procedure: ESOPHAGOGASTRODUODENOSCOPY (EGD) WITH PROPOFOL WITH DILATION;  Surgeon: Jonathon Bellows, MD;  Location: [facility] ENDOSCOPY;  Service: Gastroenterology;  Laterality: N/A;  . PORTACATH PLACEMENT N/A 02/15/2019   Procedure: INSERTION PORT-A-CATH WITH POSSIBLE ULTRASOUND;  Surgeon: Jovita Kussmaul, MD;  Location: [facility] ORS;  Service: General;  Laterality: N/A;  . RE-EXCISION OF BREAST CANCER,SUPERIOR MARGINS Left 02/15/2019   Procedure: RE-EXCISION OF LEFT BREAST CANCER ANTERIOR MARGINS;  Surgeon: Jovita Kussmaul, MD;  Location: [facility] ORS;  Service: General;  Laterality: Left;  . THROAT SURGERY  2015  . UMBILICAL HERNIA REPAIR  2006    x 2   w mesh per pt    Social History   Socioeconomic History  . Marital status: Significant Other    Spouse name: Not on file  . Number of children: Not on file  . Years of education: Not on file  . Highest education level: Not on file  Occupational History  . Not on file  Tobacco Use  . Smoking status: Former Smoker    Packs/day: 1.00    Years: 20.00    Pack years: 20.00    Quit date: 06/02/2006    Years since quitting: 12.8  . Smokeless tobacco: Never Used  Substance and Sexual Activity  . Alcohol use: Yes     Alcohol/week: 2.0 - 3.0 standard drinks    Types: 2 - 3 Standard drinks or equivalent per week    Comment: socially  . Drug use: No  . Sexual activity: Yes    Partners: Male  Other Topics Concern  . 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 As of 02/26/17    Divorced.    Former smoker 20+ years quit in 1997 then started again for 5 years and quit 10-12 years ago as of 03/08/17    Social Determinants of Health   Financial Resource Strain:   . Difficulty of Paying Living Expenses: Not on file  Food Insecurity:   . Worried About Charity fundraiser in the Last Year: Not on file  . Ran Out of Food in the Last Year: Not on file  Transportation Needs:   . Lack of Transportation (Medical): Not on file  . Lack of Transportation (Non-Medical): Not on file  Physical Activity:   . Days  of Exercise per Week: Not on file  . Minutes of Exercise per Session: Not on file  Stress:   . Feeling of Stress : Not on file  Social Connections:   . Frequency of Communication with Friends and Family: Not on file  . Frequency of Social Gatherings with Friends and Family: Not on file  . Attends Religious Services: Not on file  . Active Member of Clubs or Organizations: Not on file  . Attends Archivist Meetings: Not on file  . Marital Status: Not on file  Intimate Partner Violence:   . Fear of Current or Ex-Partner: Not on file  . Emotionally Abused: Not on file  . Physically Abused: Not on file  . Sexually Abused: Not on file    Family History  Problem Relation Age of Onset  . Breast cancer Mother        dx late 13s  . Thyroid disease Mother   . Colon polyps Mother   . Prostate cancer Father        dx 62  . Breast cancer Maternal Grandmother        dx late 62s  . Cancer Maternal Uncle        possibly, unsure type     Current Outpatient Medications:    .  acetaminophen (TYLENOL) 500 MG tablet, Take 500-1,000 mg by mouth every 6 (six) hours as needed (for pain.)., Disp: , Rfl:  .  Cholecalciferol (VITAMIN D-3) 125 MCG (5000 UT) TABS, Take 5,000 Units by mouth daily., Disp: , Rfl:  .  Cyanocobalamin (VITAMIN B-12 PO), Take 1 tablet by mouth daily., Disp: , Rfl:  .  dexamethasone (DECADRON) 4 MG tablet, Take 2 tablets by mouth once a day on the day after chemotherapy and then take 2 tablets two times a day for 2 days. Take with food., Disp: 30 tablet, Rfl: 1 .  fluticasone (FLONASE) 50 MCG/ACT nasal spray, Place 2 sprays into both nostrils daily. Max 2 sprays, Disp: 16 g, Rfl: 12 .  gabapentin (NEURONTIN) 300 MG capsule, Take 300 mg by mouth at bedtime., Disp: , Rfl:  .  lidocaine-prilocaine (EMLA) cream, Apply to affected area once, Disp: 30 g, Rfl: 3 .  meclizine (ANTIVERT) 12.5 MG tablet, Take 1 tablet (12.5 mg total) by mouth 3 (three) times daily as needed for dizziness., Disp: 90 tablet, Rfl: 1 .  meloxicam (MOBIC) 15 MG tablet, Take 15 mg by mouth daily., Disp: , Rfl:  .  methocarbamol (ROBAXIN) 750 MG tablet, Take 1 tablet (750 mg total) by mouth every 6 (six) hours as needed for muscle spasms., Disp: 120 tablet, Rfl: 5 .  montelukast (SINGULAIR) 10 MG tablet, Take 1 tablet (10 mg total) by mouth at bedtime., Disp: 90 tablet, Rfl: 3 .  omeprazole (PRILOSEC) 40 MG capsule, Take 40 mg by mouth every evening. , Disp: , Rfl:  .  ondansetron (ZOFRAN) 8 MG tablet, Take 1 tablet (8 mg total) by mouth 2 (two) times daily as needed. Start on the third day after chemotherapy., Disp: 30 tablet, Rfl: 1 .  prochlorperazine (COMPAZINE) 10 MG tablet, Take 1 tablet (10 mg total) by mouth every 6 (six) hours as needed (Nausea or vomiting)., Disp: 30 tablet, Rfl: 1 .  tiZANidine (ZANAFLEX) 4 MG tablet, Take 4 mg by mouth at bedtime. , Disp: , Rfl:  .  traMADol (ULTRAM) 50 MG tablet, Take 1-2 tablets (50-100 mg total) by mouth every 6 (six) hours as needed.,  Disp: 20 tablet,  Rfl: 0 .  traZODone (DESYREL) 50 MG tablet, Take 1-1.5 tablets (50-75 mg total) by mouth at bedtime as needed for sleep. (Patient taking differently: Take 50 mg by mouth at bedtime as needed for sleep. ), Disp: 135 tablet, Rfl: 4  Physical exam:  Vitals:   04/02/19 0933  BP: 119/73  Pulse: 78  Resp: 16  Temp: 97.7 F (36.5 C)  TempSrc: Tympanic  Weight: 173 lb 9.6 oz (78.7 kg)   Physical Exam Constitutional:      General: She is not in acute distress. HENT:     Head: Normocephalic and atraumatic.  Eyes:     Pupils: Pupils are equal, round, and reactive to light.  Cardiovascular:     Rate and Rhythm: Normal rate and regular rhythm.     Heart sounds: Normal heart sounds.  Pulmonary:     Effort: Pulmonary effort is normal.     Breath sounds: Normal breath sounds.  Abdominal:     General: Bowel sounds are normal.     Palpations: Abdomen is soft.  Musculoskeletal:     Cervical back: Normal range of motion.  Skin:    General: Skin is warm and dry.  Neurological:     Mental Status: She is alert and oriented to person, place, and time.      CMP Latest Ref Rng & Units 04/02/2019  Glucose 70 - 99 mg/dL 122(H)  BUN 6 - 20 mg/dL 18  Creatinine 0.44 - 1.00 mg/dL 0.44  Sodium 135 - 145 mmol/L 139  Potassium 3.5 - 5.1 mmol/L 3.6  Chloride 98 - 111 mmol/L 107  CO2 22 - 32 mmol/L 24  Calcium 8.9 - 10.3 mg/dL 8.6(L)  Total Protein 6.5 - 8.1 g/dL 6.9  Total Bilirubin 0.3 - 1.2 mg/dL 0.5  Alkaline Phos 38 - 126 U/L 64  AST 15 - 41 U/L 20  ALT 0 - 44 U/L 22   CBC Latest Ref Rng & Units 04/02/2019  WBC 4.0 - 10.5 K/uL 11.0(H)  Hemoglobin 12.0 - 15.0 g/dL 13.0  Hematocrit 36.0 - 46.0 % 37.8  Platelets 150 - 400 K/uL 366      Assessment and plan- Patient is a 54 y.o. female with prior history of polycythemia now with newly diagnosed invasive mammary carcinoma pathologic prognostic stage Ia of the left breast pT1b pN1 acM0 ER/PR positive HER-2/neu negative. She  is s/p lumpectomy with high risk MammaPrint score.  She is here for on treatment assessment prior to cycle 2 of adjuvant AC chemotherapy  Counts okay to proceed with cycle 2 of adjuvant TC chemotherapy with on pro-Neulasta support.  I will see her back in 2 weeks to cycle 3.  Overall she is tolerating chemotherapy well.  Constipation: Continue MiraLAX and Senokot   Visit Diagnosis 1. Encounter for antineoplastic chemotherapy   2. Malignant neoplasm of upper-inner quadrant of left breast in female, estrogen receptor positive ([place])   3. Constipation, unspecified constipation type      Dr. Randa Evens, MD, MPH [facility] at [facility] [phone] 04/04/2019 9:57 AM

## 2019-04-11 ENCOUNTER — Encounter: Payer: Self-pay | Admitting: *Deleted

## 2019-04-16 ENCOUNTER — Inpatient Hospital Stay: Payer: Medicaid Other | Attending: Oncology

## 2019-04-16 ENCOUNTER — Encounter: Payer: Self-pay | Admitting: Oncology

## 2019-04-16 ENCOUNTER — Other Ambulatory Visit: Payer: Self-pay

## 2019-04-16 ENCOUNTER — Inpatient Hospital Stay (HOSPITAL_BASED_OUTPATIENT_CLINIC_OR_DEPARTMENT_OTHER): Payer: Medicaid Other | Admitting: Oncology

## 2019-04-16 ENCOUNTER — Inpatient Hospital Stay: Payer: Medicaid Other

## 2019-04-16 VITALS — BP 135/79 | HR 70 | Temp 97.0°F | Wt 173.5 lb

## 2019-04-16 DIAGNOSIS — C50212 Malignant neoplasm of upper-inner quadrant of left female breast: Secondary | ICD-10-CM | POA: Diagnosis not present

## 2019-04-16 DIAGNOSIS — R110 Nausea: Secondary | ICD-10-CM

## 2019-04-16 DIAGNOSIS — Z791 Long term (current) use of non-steroidal anti-inflammatories (NSAID): Secondary | ICD-10-CM | POA: Diagnosis not present

## 2019-04-16 DIAGNOSIS — Z5111 Encounter for antineoplastic chemotherapy: Secondary | ICD-10-CM

## 2019-04-16 DIAGNOSIS — Z5189 Encounter for other specified aftercare: Secondary | ICD-10-CM | POA: Insufficient documentation

## 2019-04-16 DIAGNOSIS — Z8349 Family history of other endocrine, nutritional and metabolic diseases: Secondary | ICD-10-CM

## 2019-04-16 DIAGNOSIS — T451X5A Adverse effect of antineoplastic and immunosuppressive drugs, initial encounter: Secondary | ICD-10-CM | POA: Insufficient documentation

## 2019-04-16 DIAGNOSIS — Z87891 Personal history of nicotine dependence: Secondary | ICD-10-CM | POA: Diagnosis not present

## 2019-04-16 DIAGNOSIS — R5383 Other fatigue: Secondary | ICD-10-CM | POA: Insufficient documentation

## 2019-04-16 DIAGNOSIS — Z8719 Personal history of other diseases of the digestive system: Secondary | ICD-10-CM | POA: Insufficient documentation

## 2019-04-16 DIAGNOSIS — Z170 Estrogen receptor positive status [ER+]: Secondary | ICD-10-CM | POA: Insufficient documentation

## 2019-04-16 DIAGNOSIS — F329 Major depressive disorder, single episode, unspecified: Secondary | ICD-10-CM | POA: Diagnosis not present

## 2019-04-16 DIAGNOSIS — Z7952 Long term (current) use of systemic steroids: Secondary | ICD-10-CM

## 2019-04-16 DIAGNOSIS — Z79899 Other long term (current) drug therapy: Secondary | ICD-10-CM | POA: Insufficient documentation

## 2019-04-16 DIAGNOSIS — F419 Anxiety disorder, unspecified: Secondary | ICD-10-CM | POA: Insufficient documentation

## 2019-04-16 DIAGNOSIS — K219 Gastro-esophageal reflux disease without esophagitis: Secondary | ICD-10-CM | POA: Diagnosis not present

## 2019-04-16 DIAGNOSIS — Z803 Family history of malignant neoplasm of breast: Secondary | ICD-10-CM | POA: Insufficient documentation

## 2019-04-16 LAB — CBC WITH DIFFERENTIAL/PLATELET
Abs Immature Granulocytes: 1.95 10*3/uL — ABNORMAL HIGH (ref 0.00–0.07)
Basophils Absolute: 0.4 10*3/uL — ABNORMAL HIGH (ref 0.0–0.1)
Basophils Relative: 3 %
Eosinophils Absolute: 0.1 10*3/uL (ref 0.0–0.5)
Eosinophils Relative: 1 %
HCT: 37.8 % (ref 36.0–46.0)
Hemoglobin: 12.8 g/dL (ref 12.0–15.0)
Immature Granulocytes: 12 %
Lymphocytes Relative: 11 %
Lymphs Abs: 1.7 10*3/uL (ref 0.7–4.0)
MCH: 31.8 pg (ref 26.0–34.0)
MCHC: 33.9 g/dL (ref 30.0–36.0)
MCV: 94 fL (ref 80.0–100.0)
Monocytes Absolute: 1.5 10*3/uL — ABNORMAL HIGH (ref 0.1–1.0)
Monocytes Relative: 10 %
Neutro Abs: 10.1 10*3/uL — ABNORMAL HIGH (ref 1.7–7.7)
Neutrophils Relative %: 63 %
Platelets: 223 10*3/uL (ref 150–400)
RBC: 4.02 MIL/uL (ref 3.87–5.11)
RDW: 12.5 % (ref 11.5–15.5)
WBC: 15.8 10*3/uL — ABNORMAL HIGH (ref 4.0–10.5)
nRBC: 0.3 % — ABNORMAL HIGH (ref 0.0–0.2)

## 2019-04-16 LAB — COMPREHENSIVE METABOLIC PANEL
ALT: 20 U/L (ref 0–44)
AST: 20 U/L (ref 15–41)
Albumin: 3.7 g/dL (ref 3.5–5.0)
Alkaline Phosphatase: 73 U/L (ref 38–126)
Anion gap: 10 (ref 5–15)
BUN: 16 mg/dL (ref 6–20)
CO2: 24 mmol/L (ref 22–32)
Calcium: 9 mg/dL (ref 8.9–10.3)
Chloride: 102 mmol/L (ref 98–111)
Creatinine, Ser: 0.52 mg/dL (ref 0.44–1.00)
GFR calc Af Amer: >60 mL/min (ref >60)
GFR calc non Af Amer: >60 mL/min (ref >60)
Glucose, Bld: 108 mg/dL — ABNORMAL HIGH (ref 70–99)
Potassium: 3.8 mmol/L (ref 3.5–5.1)
Sodium: 136 mmol/L (ref 135–145)
Total Bilirubin: 0.5 mg/dL (ref 0.3–1.2)
Total Protein: 6.9 g/dL (ref 6.5–8.1)

## 2019-04-16 MED ORDER — PALONOSETRON HCL INJECTION 0.25 MG/5ML
0.2500 mg | Freq: Once | INTRAVENOUS | Status: AC
  Administered 2019-04-16: 0.25 mg via INTRAVENOUS
  Filled 2019-04-16: qty 5

## 2019-04-16 MED ORDER — SODIUM CHLORIDE 0.9 % IV SOLN
10.0000 mg | Freq: Once | INTRAVENOUS | Status: AC
  Administered 2019-04-16: 12:00:00 10 mg via INTRAVENOUS
  Filled 2019-04-16: qty 1

## 2019-04-16 MED ORDER — SODIUM CHLORIDE 0.9 % IV SOLN
150.0000 mg | Freq: Once | INTRAVENOUS | Status: AC
  Administered 2019-04-16: 150 mg via INTRAVENOUS
  Filled 2019-04-16: qty 5

## 2019-04-16 MED ORDER — SODIUM CHLORIDE 0.9 % IV SOLN
600.0000 mg/m2 | Freq: Once | INTRAVENOUS | Status: AC
  Administered 2019-04-16: 1120 mg via INTRAVENOUS
  Filled 2019-04-16: qty 50

## 2019-04-16 MED ORDER — PEGFILGRASTIM 6 MG/0.6ML ~~LOC~~ PSKT
6.0000 mg | PREFILLED_SYRINGE | Freq: Once | SUBCUTANEOUS | Status: AC
  Administered 2019-04-16: 6 mg via SUBCUTANEOUS
  Filled 2019-04-16: qty 0.6

## 2019-04-16 MED ORDER — SODIUM CHLORIDE 0.9 % IV SOLN
Freq: Once | INTRAVENOUS | Status: AC
  Filled 2019-04-16: qty 250

## 2019-04-16 MED ORDER — SODIUM CHLORIDE 0.9% FLUSH
10.0000 mL | Freq: Once | INTRAVENOUS | Status: AC
  Administered 2019-04-16: 10 mL via INTRAVENOUS
  Filled 2019-04-16: qty 10

## 2019-04-16 MED ORDER — HEPARIN SOD (PORK) LOCK FLUSH 100 UNIT/ML IV SOLN
500.0000 [IU] | Freq: Once | INTRAVENOUS | Status: AC | PRN
  Administered 2019-04-16: 500 [IU]
  Filled 2019-04-16: qty 5

## 2019-04-16 MED ORDER — HEPARIN SOD (PORK) LOCK FLUSH 100 UNIT/ML IV SOLN
500.0000 [IU] | Freq: Once | INTRAVENOUS | Status: AC
  Administered 2019-04-16: 500 [IU] via INTRAVENOUS
  Filled 2019-04-16: qty 5

## 2019-04-16 MED ORDER — OLANZAPINE 10 MG PO TABS: 10.0000 mg | ORAL_TABLET | Freq: Every day | ORAL | 0 refills | Status: DC

## 2019-04-16 MED ORDER — DOXORUBICIN HCL CHEMO IV INJECTION 2 MG/ML
60.0000 mg/m2 | Freq: Once | INTRAVENOUS | Status: AC
  Administered 2019-04-16: 112 mg via INTRAVENOUS
  Filled 2019-04-16: qty 50

## 2019-04-16 NOTE — Progress Notes (Signed)
Patient stated that she had been constipated but doing better now. Patient also stated that she had been having dry mouth, dry nose and bleeds when she blows her nose and nausea but no vomiting.

## 2019-04-18 ENCOUNTER — Ambulatory Visit (INDEPENDENT_AMBULATORY_CARE_PROVIDER_SITE_OTHER): Payer: Medicaid Other | Admitting: Psychology

## 2019-04-18 DIAGNOSIS — F418 Other specified anxiety disorders: Secondary | ICD-10-CM

## 2019-04-18 NOTE — Progress Notes (Signed)
Hematology/Oncology Consult note [address] [facility]  [phone] [phone]  Patient Care Team: McLean-Scocuzza, Nino Glow, MD as PCP - General (Internal Medicine) Rico Junker, RN as Registered Nurse   Name of the patient: Leah [name]  [id number]  July 03, 1965   Date of visit: 04/18/19  Diagnosis- pathological prognostic stage Ia invasive mammary carcinomapT1b pN1 cM0 ER/PR positive HER-2/neu negative s/p lumpectomy  Chief complaint/ Reason for visit-on treatment assessment prior to cycle 3 of adjuvant AC chemotherapy  Heme/Onc history: Patient is a 54 year old postmenopausal female G2 P2 L2 who recently underwent screening bilateral mammogram which showed area of concern in her left breast. This was followed by an ultrasound and Core biopsy. Ultrasound showed 1 x 1.1 x 0.7 cm hypoechoic mass at the 10:30 position of the left breast. Left axilla appeared normal. 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  Final pathology showed 9 mm grade 1 invasive mammary carcinoma1 out of 2 lymph nodes involved with macro metastatic carcinoma 3.5 mm. Anterior margin was focally positive for which patientunderwent reexcision surgery with negative marginsPT1BPN1a (sn)  MammaPrint score came back as high risk with a risk of recurrence of 29% at 10 years without chemotherapy   Interval history-reports ongoing fatigue.  She did have significant nausea after second cycle of chemotherapy.  This lasted for about 4 to 5 days.  Denies other complaints  ECOG PS- 0 Pain scale- 0   Review of systems- Review of Systems  Constitutional: Positive for malaise/fatigue. Negative for chills, fever and weight loss.  HENT: Negative for congestion, ear discharge and nosebleeds.   Eyes: Negative for blurred vision.  Respiratory: Negative for cough, hemoptysis, sputum production, shortness of  breath and wheezing.   Cardiovascular: Negative for chest pain, palpitations, orthopnea and claudication.  Gastrointestinal: Positive for nausea. Negative for abdominal pain, blood in stool, constipation, diarrhea, heartburn, melena and vomiting.  Genitourinary: Negative for dysuria, flank pain, frequency, hematuria and urgency.  Musculoskeletal: Negative for back pain, joint pain and myalgias.  Skin: Negative for rash.  Neurological: Negative for dizziness, tingling, focal weakness, seizures, weakness and headaches.  Endo/Heme/Allergies: Does not bruise/bleed easily.  Psychiatric/Behavioral: Negative for depression and suicidal ideas. The patient does not have insomnia.       Allergies  Allergen Reactions  . Hydrocodone-Acetaminophen Hives    All over her body.  . Lactose Intolerance (Gi) Other (See Comments)    Bloating and GI upset  . Penicillins Rash    Did it involve swelling of the face/tongue/throat, SOB, or low BP? Unknown Did it involve sudden or severe rash/hives, skin peeling, or any reaction on the inside of your mouth or nose? Yes Did you need to seek medical attention at a hospital or doctor's office? Unknown When did it last happen? Childhood reaction. If all above answers are "NO", may proceed with cephalosporin use.   . Sulfa Antibiotics Rash    Full body rash      Past Medical History:  Diagnosis Date  . Anxiety   . Asthma    as a child  . Breast cancer ([place])   . Carpal tunnel syndrome    R hand   . Colon polyps   . Depression   . Eosinophilic esophagitis    noted in [place] with GI path report 09/29/14   . Esophageal stricture    s/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 Edwin Cap   .  Family history of breast cancer   . Family history of prostate cancer   . Herniated disc, cervical    s/p epidural injection  . History of kidney stones   . Hyperlipidemia   . Low back pain   . Migraines      Past Surgical History:    Procedure Laterality Date  . BREAST BIOPSY Left 12/25/2018   [place] BX, PATH PENDING  . BREAST LUMPECTOMY WITH NEEDLE LOCALIZATION AND AXILLARY SENTINEL LYMPH NODE BX Left 01/16/2019   Procedure: LEFT BREAST LUMPECTOMY WITH NEEDLE LOCALIZATION AND SENTINEL LYMPH NODE BIOPSY;  Surgeon: Jovita Kussmaul, MD;  Location: [facility] ORS;  Service: General;  Laterality: Left;  . CESAREAN SECTION    . COLONOSCOPY WITH PROPOFOL N/A 03/27/2017   Procedure: COLONOSCOPY WITH PROPOFOL;  Surgeon: Jonathon Bellows, MD;  Location: [facility] ENDOSCOPY;  Service: Gastroenterology;  Laterality: N/A;  . ESOPHAGOGASTRODUODENOSCOPY    . ESOPHAGOGASTRODUODENOSCOPY (EGD) WITH PROPOFOL N/A 03/27/2017   Procedure: ESOPHAGOGASTRODUODENOSCOPY (EGD) WITH PROPOFOL WITH DILATION;  Surgeon: Jonathon Bellows, MD;  Location: [facility] ENDOSCOPY;  Service: Gastroenterology;  Laterality: N/A;  . PORTACATH PLACEMENT N/A 02/15/2019   Procedure: INSERTION PORT-A-CATH WITH POSSIBLE ULTRASOUND;  Surgeon: Jovita Kussmaul, MD;  Location: [facility] ORS;  Service: General;  Laterality: N/A;  . RE-EXCISION OF BREAST CANCER,SUPERIOR MARGINS Left 02/15/2019   Procedure: RE-EXCISION OF LEFT BREAST CANCER ANTERIOR MARGINS;  Surgeon: Jovita Kussmaul, MD;  Location: [facility] ORS;  Service: General;  Laterality: Left;  . THROAT SURGERY  2015  . UMBILICAL HERNIA REPAIR  2006    x 2   w mesh per pt    Social History   Socioeconomic History  . Marital status: Significant Other    Spouse name: Not on file  . Number of children: Not on file  . Years of education: Not on file  . Highest education level: Not on file  Occupational History  . Not on file  Tobacco Use  . Smoking status: Former Smoker    Packs/day: 1.00    Years: 20.00    Pack years: 20.00    Quit date: 06/02/2006    Years since quitting: 12.8  . Smokeless tobacco: Never Used  Substance and Sexual Activity  . Alcohol use: Yes    Alcohol/week: 2.0 - 3.0 standard drinks    Types: 2 - 3 Standard drinks or equivalent per  week    Comment: socially  . Drug use: No  . Sexual activity: Yes    Partners: Male  Other Topics Concern  . 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 As of 02/26/17    Divorced.    Former smoker 20+ years quit in 1997 then started again for 5 years and quit 10-12 years ago as of 03/08/17    Social Determinants of Health   Financial Resource Strain:   . Difficulty of Paying Living Expenses: Not on file  Food Insecurity:   . Worried About Charity fundraiser in the Last Year: Not on file  . Ran Out of Food in the Last Year: Not on file  Transportation Needs:   . Lack of Transportation (Medical): Not on file  . Lack of Transportation (Non-Medical): Not on file  Physical Activity:   . Days of Exercise per Week: Not on file  . Minutes of Exercise per  Session: Not on file  Stress:   . Feeling of Stress : Not on file  Social Connections:   . Frequency of Communication with Friends and Family: Not on file  . Frequency of Social Gatherings with Friends and Family: Not on file  . Attends Religious Services: Not on file  . Active Member of Clubs or Organizations: Not on file  . Attends Archivist Meetings: Not on file  . Marital Status: Not on file  Intimate Partner Violence:   . Fear of Current or Ex-Partner: Not on file  . Emotionally Abused: Not on file  . Physically Abused: Not on file  . Sexually Abused: Not on file    Family History  Problem Relation Age of Onset  . Breast cancer Mother        dx late 12s  . Thyroid disease Mother   . Colon polyps Mother   . Prostate cancer Father        dx 9  . Breast cancer Maternal Grandmother        dx late 72s  . Cancer Maternal Uncle        possibly, unsure type     Current Outpatient Medications:  .  acetaminophen (TYLENOL) 500 MG tablet, Take 500-1,000 mg by mouth every 6 (six) hours  as needed (for pain.)., Disp: , Rfl:  .  Cholecalciferol (VITAMIN D-3) 125 MCG (5000 UT) TABS, Take 5,000 Units by mouth daily., Disp: , Rfl:  .  Cyanocobalamin (VITAMIN B-12 PO), Take 1 tablet by mouth daily., Disp: , Rfl:  .  dexamethasone (DECADRON) 4 MG tablet, Take 2 tablets by mouth once a day on the day after chemotherapy and then take 2 tablets two times a day for 2 days. Take with food., Disp: 30 tablet, Rfl: 1 .  fluticasone (FLONASE) 50 MCG/ACT nasal spray, Place 2 sprays into both nostrils daily. Max 2 sprays, Disp: 16 g, Rfl: 12 .  gabapentin (NEURONTIN) 300 MG capsule, Take 300 mg by mouth at bedtime., Disp: , Rfl:  .  lidocaine-prilocaine (EMLA) cream, Apply to affected area once, Disp: 30 g, Rfl: 3 .  meloxicam (MOBIC) 15 MG tablet, Take 15 mg by mouth daily., Disp: , Rfl:  .  montelukast (SINGULAIR) 10 MG tablet, Take 1 tablet (10 mg total) by mouth at bedtime., Disp: 90 tablet, Rfl: 3 .  omeprazole (PRILOSEC) 40 MG capsule, Take 40 mg by mouth every evening. , Disp: , Rfl:  .  prochlorperazine (COMPAZINE) 10 MG tablet, Take 1 tablet (10 mg total) by mouth every 6 (six) hours as needed (Nausea or vomiting)., Disp: 30 tablet, Rfl: 1 .  tiZANidine (ZANAFLEX) 4 MG tablet, Take 4 mg by mouth at bedtime. , Disp: , Rfl:  .  traMADol (ULTRAM) 50 MG tablet, Take 1-2 tablets (50-100 mg total) by mouth every 6 (six) hours as needed., Disp: 20 tablet, Rfl: 0 .  traZODone (DESYREL) 50 MG tablet, Take 1-1.5 tablets (50-75 mg total) by mouth at bedtime as needed for sleep. (Patient taking differently: Take 50 mg by mouth at bedtime as needed for sleep. ), Disp: 135 tablet, Rfl: 4 .  meclizine (ANTIVERT) 12.5 MG tablet, Take 1 tablet (12.5 mg total) by mouth 3 (three) times daily as needed for dizziness. (Patient not taking: Reported on 04/16/2019), Disp: 90 tablet, Rfl: 1 .  methocarbamol (ROBAXIN) 750 MG tablet, Take 1 tablet (750 mg total) by mouth every 6 (six) hours as needed for muscle spasms.  (Patient not taking:  Reported on 04/16/2019), Disp: 120 tablet, Rfl: 5 .  OLANZapine (ZYPREXA) 10 MG tablet, Take 1 tablet (10 mg total) by mouth at bedtime., Disp: 30 tablet, Rfl: 0  Physical exam:  Vitals:   04/16/19 1017  BP: 135/79  Pulse: 70  Temp: (!) 97 F (36.1 C)  TempSrc: Tympanic  Weight: 173 lb 8 oz (78.7 kg)   Physical Exam HENT:     Head: Normocephalic and atraumatic.  Eyes:     Pupils: Pupils are equal, round, and reactive to light.  Cardiovascular:     Rate and Rhythm: Normal rate and regular rhythm.     Heart sounds: Normal heart sounds.  Pulmonary:     Effort: Pulmonary effort is normal.     Breath sounds: Normal breath sounds.  Abdominal:     General: Bowel sounds are normal.     Palpations: Abdomen is soft.  Musculoskeletal:     Cervical back: Normal range of motion.  Skin:    General: Skin is warm and dry.  Neurological:     Mental Status: She is alert and oriented to person, place, and time.      CMP Latest Ref Rng & Units 04/16/2019  Glucose 70 - 99 mg/dL 108(H)  BUN 6 - 20 mg/dL 16  Creatinine 0.44 - 1.00 mg/dL 0.52  Sodium 135 - 145 mmol/L 136  Potassium 3.5 - 5.1 mmol/L 3.8  Chloride 98 - 111 mmol/L 102  CO2 22 - 32 mmol/L 24  Calcium 8.9 - 10.3 mg/dL 9.0  Total Protein 6.5 - 8.1 g/dL 6.9  Total Bilirubin 0.3 - 1.2 mg/dL 0.5  Alkaline Phos 38 - 126 U/L 73  AST 15 - 41 U/L 20  ALT 0 - 44 U/L 20   CBC Latest Ref Rng & Units 04/16/2019  WBC 4.0 - 10.5 K/uL 15.8(H)  Hemoglobin 12.0 - 15.0 g/dL 12.8  Hematocrit 36.0 - 46.0 % 37.8  Platelets 150 - 400 K/uL 223      Assessment and plan- Patient is a 54 y.o. female with prior history of polycythemia now with newly diagnosed invasive mammary carcinoma pathologic prognostic stage Ia of the left breast pT1b pN1 acM0 ER/PR positive HER-2/neu negative. She is s/p lumpectomywith high risk MammaPrint score.   She is here for on treatment assessment prior to cycle 3 of adjuvant AC  chemotherapy  Counseling to proceed with cycle 3 of adjuvant AC chemotherapy today.  I will see her back in 2 weeks for cycle 4.  She will receive 1 for Neulasta with each cycle of AC chemotherapy.  Chemo-induced nausea: I will add Zyprexa 10 mg nightly which she can take for a week after chemotherapy to see if it helps reduce delayed nausea induced by chemotherapy   Visit Diagnosis 1. Chemotherapy-induced nausea   2. Encounter for antineoplastic chemotherapy   3. Malignant neoplasm of upper-inner quadrant of left breast in female, estrogen receptor positive ([place])      Dr. Randa Evens, MD, MPH [facility] at [facility] [phone] 04/18/2019 3:00 PM

## 2019-04-30 ENCOUNTER — Inpatient Hospital Stay (HOSPITAL_BASED_OUTPATIENT_CLINIC_OR_DEPARTMENT_OTHER): Payer: Medicaid Other | Admitting: Oncology

## 2019-04-30 ENCOUNTER — Encounter: Payer: Self-pay | Admitting: Oncology

## 2019-04-30 ENCOUNTER — Other Ambulatory Visit: Payer: Self-pay

## 2019-04-30 ENCOUNTER — Inpatient Hospital Stay: Payer: Medicaid Other

## 2019-04-30 VITALS — Resp 20

## 2019-04-30 VITALS — BP 132/76 | HR 80 | Temp 97.5°F | Wt 180.1 lb

## 2019-04-30 DIAGNOSIS — Z5111 Encounter for antineoplastic chemotherapy: Secondary | ICD-10-CM | POA: Diagnosis not present

## 2019-04-30 DIAGNOSIS — Z170 Estrogen receptor positive status [ER+]: Secondary | ICD-10-CM

## 2019-04-30 DIAGNOSIS — C50212 Malignant neoplasm of upper-inner quadrant of left female breast: Secondary | ICD-10-CM

## 2019-04-30 DIAGNOSIS — Z95828 Presence of other vascular implants and grafts: Secondary | ICD-10-CM

## 2019-04-30 LAB — COMPREHENSIVE METABOLIC PANEL
ALT: 23 U/L (ref 0–44)
AST: 22 U/L (ref 15–41)
Albumin: 3.7 g/dL (ref 3.5–5.0)
Alkaline Phosphatase: 75 U/L (ref 38–126)
Anion gap: 9 (ref 5–15)
BUN: 19 mg/dL (ref 6–20)
CO2: 25 mmol/L (ref 22–32)
Calcium: 9 mg/dL (ref 8.9–10.3)
Chloride: 105 mmol/L (ref 98–111)
Creatinine, Ser: 0.68 mg/dL (ref 0.44–1.00)
GFR calc Af Amer: >60 mL/min (ref >60)
GFR calc non Af Amer: >60 mL/min (ref >60)
Glucose, Bld: 115 mg/dL — ABNORMAL HIGH (ref 70–99)
Potassium: 3.6 mmol/L (ref 3.5–5.1)
Sodium: 139 mmol/L (ref 135–145)
Total Bilirubin: 0.3 mg/dL (ref 0.3–1.2)
Total Protein: 6.5 g/dL (ref 6.5–8.1)

## 2019-04-30 LAB — CBC WITH DIFFERENTIAL/PLATELET
Abs Immature Granulocytes: 1.42 10*3/uL — ABNORMAL HIGH (ref 0.00–0.07)
Basophils Absolute: 0.3 10*3/uL — ABNORMAL HIGH (ref 0.0–0.1)
Basophils Relative: 2 %
Eosinophils Absolute: 0.1 10*3/uL (ref 0.0–0.5)
Eosinophils Relative: 1 %
HCT: 34.3 % — ABNORMAL LOW (ref 36.0–46.0)
Hemoglobin: 11.6 g/dL — ABNORMAL LOW (ref 12.0–15.0)
Immature Granulocytes: 10 %
Lymphocytes Relative: 9 %
Lymphs Abs: 1.4 10*3/uL (ref 0.7–4.0)
MCH: 32.2 pg (ref 26.0–34.0)
MCHC: 33.8 g/dL (ref 30.0–36.0)
MCV: 95.3 fL (ref 80.0–100.0)
Monocytes Absolute: 1.5 10*3/uL — ABNORMAL HIGH (ref 0.1–1.0)
Monocytes Relative: 10 %
Neutro Abs: 10.3 10*3/uL — ABNORMAL HIGH (ref 1.7–7.7)
Neutrophils Relative %: 68 %
Platelets: 231 10*3/uL (ref 150–400)
RBC: 3.6 MIL/uL — ABNORMAL LOW (ref 3.87–5.11)
RDW: 13.9 % (ref 11.5–15.5)
WBC: 14.9 10*3/uL — ABNORMAL HIGH (ref 4.0–10.5)
nRBC: 0.3 % — ABNORMAL HIGH (ref 0.0–0.2)

## 2019-04-30 MED ORDER — SODIUM CHLORIDE 0.9 % IV SOLN
10.0000 mg | Freq: Once | INTRAVENOUS | Status: AC
  Administered 2019-04-30: 10 mg via INTRAVENOUS
  Filled 2019-04-30: qty 1

## 2019-04-30 MED ORDER — PEGFILGRASTIM 6 MG/0.6ML ~~LOC~~ PSKT
6.0000 mg | PREFILLED_SYRINGE | Freq: Once | SUBCUTANEOUS | Status: AC
  Administered 2019-04-30: 6 mg via SUBCUTANEOUS
  Filled 2019-04-30: qty 0.6

## 2019-04-30 MED ORDER — DOXORUBICIN HCL CHEMO IV INJECTION 2 MG/ML
60.0000 mg/m2 | Freq: Once | INTRAVENOUS | Status: AC
  Administered 2019-04-30: 112 mg via INTRAVENOUS
  Filled 2019-04-30: qty 56

## 2019-04-30 MED ORDER — SODIUM CHLORIDE 0.9 % IV SOLN
600.0000 mg/m2 | Freq: Once | INTRAVENOUS | Status: AC
  Administered 2019-04-30: 1120 mg via INTRAVENOUS
  Filled 2019-04-30: qty 50

## 2019-04-30 MED ORDER — PALONOSETRON HCL INJECTION 0.25 MG/5ML
0.2500 mg | Freq: Once | INTRAVENOUS | Status: AC
  Administered 2019-04-30: 0.25 mg via INTRAVENOUS
  Filled 2019-04-30: qty 5

## 2019-04-30 MED ORDER — SODIUM CHLORIDE 0.9% FLUSH
10.0000 mL | Freq: Once | INTRAVENOUS | Status: AC
  Administered 2019-04-30: 11:00:00 10 mL via INTRAVENOUS
  Filled 2019-04-30: qty 10

## 2019-04-30 MED ORDER — HEPARIN SOD (PORK) LOCK FLUSH 100 UNIT/ML IV SOLN
500.0000 [IU] | Freq: Once | INTRAVENOUS | Status: AC | PRN
  Administered 2019-04-30: 500 [IU]
  Filled 2019-04-30: qty 5

## 2019-04-30 MED ORDER — SODIUM CHLORIDE 0.9 % IV SOLN
150.0000 mg | Freq: Once | INTRAVENOUS | Status: AC
  Administered 2019-04-30: 12:00:00 150 mg via INTRAVENOUS
  Filled 2019-04-30: qty 150

## 2019-04-30 MED ORDER — HEPARIN SOD (PORK) LOCK FLUSH 100 UNIT/ML IV SOLN
INTRAVENOUS | Status: AC
  Filled 2019-04-30: qty 5

## 2019-04-30 MED ORDER — SODIUM CHLORIDE 0.9 % IV SOLN
Freq: Once | INTRAVENOUS | Status: AC
  Filled 2019-04-30: qty 250

## 2019-04-30 NOTE — Progress Notes (Signed)
Patient stated that she had been nauseated for the past two days.

## 2019-05-02 NOTE — Progress Notes (Signed)
Hematology/Oncology Consult note [facility]  [phone] [phone]  Patient Care Team: McLean-Scocuzza, Nino Glow, MD as PCP - General (Internal Medicine) Rico Junker, RN as Registered Nurse   Name of the patient: Margan Elias  [id number]  1965/12/31   Date of visit: 05/02/19  Diagnosis- pathological prognostic stage Ia invasive mammary carcinomapT1b pN1 cM0 ER/PR positive HER-2/neu negative s/p lumpectomy  Chief complaint/ Reason for visit- on treatment assessment prior to cycle 4 of dose dense AC chemotherapy  Heme/Onc history:   Patient is a 54 year old postmenopausal female G2 P2 L2 who recently underwent screening bilateral mammogram which showed area of concern in her left breast. This was followed by an ultrasound and Core biopsy. Ultrasound showed 1 x 1.1 x 0.7 cm hypoechoic mass at the 10:30 position of the left breast. Left axilla appeared normal. 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  Final pathology showed 9 mm grade 1 invasive mammary carcinoma1 out of 2 lymph nodes involved with macro metastatic carcinoma 3.5 mm. Anterior margin was focally positive for which patientunderwent reexcision surgery with negative marginsPT1BPN1a (sn)  MammaPrint score came back as high risk with a risk of recurrence of 29% at 10 years without chemotherapy   Interval history- nausea well controlled with zyprexa. Reports getting occasional vivid dreams with zyprexa but still finds it very helpful for nausea. Reports ongoing fatigue. Denies other complaints  ECOG PS- 1 Pain scale-0 Opioid associated constipation- no  Review of systems- Review of Systems  Constitutional: Positive for malaise/fatigue. Negative for chills, fever and weight loss.  HENT: Negative for congestion, ear discharge and nosebleeds.   Eyes: Negative for blurred vision.  Respiratory:  Negative for cough, hemoptysis, sputum production, shortness of breath and wheezing.   Cardiovascular: Negative for chest pain, palpitations, orthopnea and claudication.  Gastrointestinal: Negative for abdominal pain, blood in stool, constipation, diarrhea, heartburn, melena, nausea and vomiting.  Genitourinary: Negative for dysuria, flank pain, frequency, hematuria and urgency.  Musculoskeletal: Negative for back pain, joint pain and myalgias.  Skin: Negative for rash.  Neurological: Negative for dizziness, tingling, focal weakness, seizures, weakness and headaches.  Endo/Heme/Allergies: Does not bruise/bleed easily.  Psychiatric/Behavioral: Negative for depression and suicidal ideas. The patient does not have insomnia.      Allergies  Allergen Reactions  . Hydrocodone-Acetaminophen Hives    All over her body.  . Lactose Intolerance (Gi) Other (See Comments)    Bloating and GI upset  . Penicillins Rash    Did it involve swelling of the face/tongue/throat, SOB, or low BP? Unknown Did it involve sudden or severe rash/hives, skin peeling, or any reaction on the inside of your mouth or nose? Yes Did you need to seek medical attention at a hospital or doctor's office? Unknown When did it last happen? Childhood reaction. If all above answers are "NO", may proceed with cephalosporin use.   . Sulfa Antibiotics Rash    Full body rash      Past Medical History:  Diagnosis Date  . Anxiety   . Asthma    as a child  . Breast cancer ([place])   . Carpal tunnel syndrome    R hand   . Colon polyps   . Depression   . Eosinophilic esophagitis    noted in [place] with GI path report 09/29/14   . Esophageal stricture    s/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 Edwin Cap   .  Family history of breast cancer   . Family history of prostate cancer   . Herniated disc, cervical    s/p epidural injection  . History of kidney stones   . Hyperlipidemia   . Low back pain   .  Migraines      Past Surgical History:  Procedure Laterality Date  . BREAST BIOPSY Left 12/25/2018   [place] BX, PATH PENDING  . BREAST LUMPECTOMY WITH NEEDLE LOCALIZATION AND AXILLARY SENTINEL LYMPH NODE BX Left 01/16/2019   Procedure: LEFT BREAST LUMPECTOMY WITH NEEDLE LOCALIZATION AND SENTINEL LYMPH NODE BIOPSY;  Surgeon: Jovita Kussmaul, MD;  Location: [facility] ORS;  Service: General;  Laterality: Left;  . CESAREAN SECTION    . COLONOSCOPY WITH PROPOFOL N/A 03/27/2017   Procedure: COLONOSCOPY WITH PROPOFOL;  Surgeon: Jonathon Bellows, MD;  Location: [facility] ENDOSCOPY;  Service: Gastroenterology;  Laterality: N/A;  . ESOPHAGOGASTRODUODENOSCOPY    . ESOPHAGOGASTRODUODENOSCOPY (EGD) WITH PROPOFOL N/A 03/27/2017   Procedure: ESOPHAGOGASTRODUODENOSCOPY (EGD) WITH PROPOFOL WITH DILATION;  Surgeon: Jonathon Bellows, MD;  Location: [facility] ENDOSCOPY;  Service: Gastroenterology;  Laterality: N/A;  . PORTACATH PLACEMENT N/A 02/15/2019   Procedure: INSERTION PORT-A-CATH WITH POSSIBLE ULTRASOUND;  Surgeon: Jovita Kussmaul, MD;  Location: [facility] ORS;  Service: General;  Laterality: N/A;  . RE-EXCISION OF BREAST CANCER,SUPERIOR MARGINS Left 02/15/2019   Procedure: RE-EXCISION OF LEFT BREAST CANCER ANTERIOR MARGINS;  Surgeon: Jovita Kussmaul, MD;  Location: [facility] ORS;  Service: General;  Laterality: Left;  . THROAT SURGERY  2015  . UMBILICAL HERNIA REPAIR  2006    x 2   w mesh per pt    Social History   Socioeconomic History  . Marital status: Significant Other    Spouse name: Not on file  . Number of children: Not on file  . Years of education: Not on file  . Highest education level: Not on file  Occupational History  . Not on file  Tobacco Use  . Smoking status: Former Smoker    Packs/day: 1.00    Years: 20.00    Pack years: 20.00    Quit date: 06/02/2006    Years since quitting: 12.9  . Smokeless tobacco: Never Used  Substance and Sexual Activity  . Alcohol use: Yes    Alcohol/week: 2.0 - 3.0 standard drinks     Types: 2 - 3 Standard drinks or equivalent per week    Comment: socially  . Drug use: No  . Sexual activity: Yes    Partners: Male  Other Topics Concern  . 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 As of 02/26/17    Divorced.    Former smoker 20+ years quit in 1997 then started again for 5 years and quit 10-12 years ago as of 03/08/17    Social Determinants of Health   Financial Resource Strain:   . Difficulty of Paying Living Expenses: Not on file  Food Insecurity:   . Worried About Charity fundraiser in the Last Year: Not on file  . Ran Out of Food in the Last Year: Not on file  Transportation Needs:   . Lack of Transportation (Medical): Not on file  . Lack of Transportation (Non-Medical): Not on file  Physical Activity:   . Days of Exercise per Week: Not on file  . Minutes of Exercise per Session:  Not on file  Stress:   . Feeling of Stress : Not on file  Social Connections:   . Frequency of Communication with Friends and Family: Not on file  . Frequency of Social Gatherings with Friends and Family: Not on file  . Attends Religious Services: Not on file  . Active Member of Clubs or Organizations: Not on file  . Attends Archivist Meetings: Not on file  . Marital Status: Not on file  Intimate Partner Violence:   . Fear of Current or Ex-Partner: Not on file  . Emotionally Abused: Not on file  . Physically Abused: Not on file  . Sexually Abused: Not on file    Family History  Problem Relation Age of Onset  . Breast cancer Mother        dx late 48s  . Thyroid disease Mother   . Colon polyps Mother   . Prostate cancer Father        dx 102  . Breast cancer Maternal Grandmother        dx late 3s  . Cancer Maternal Uncle        possibly, unsure type     Current Outpatient Medications:  .  acetaminophen (TYLENOL) 500 MG tablet,  Take 500-1,000 mg by mouth every 6 (six) hours as needed (for pain.)., Disp: , Rfl:  .  Cholecalciferol (VITAMIN D-3) 125 MCG (5000 UT) TABS, Take 5,000 Units by mouth daily., Disp: , Rfl:  .  Cyanocobalamin (VITAMIN B-12 PO), Take 1 tablet by mouth daily., Disp: , Rfl:  .  dexamethasone (DECADRON) 4 MG tablet, Take 2 tablets by mouth once a day on the day after chemotherapy and then take 2 tablets two times a day for 2 days. Take with food., Disp: 30 tablet, Rfl: 1 .  fluticasone (FLONASE) 50 MCG/ACT nasal spray, Place 2 sprays into both nostrils daily. Max 2 sprays, Disp: 16 g, Rfl: 12 .  gabapentin (NEURONTIN) 300 MG capsule, Take 300 mg by mouth at bedtime., Disp: , Rfl:  .  lidocaine-prilocaine (EMLA) cream, Apply to affected area once, Disp: 30 g, Rfl: 3 .  meclizine (ANTIVERT) 12.5 MG tablet, Take 1 tablet (12.5 mg total) by mouth 3 (three) times daily as needed for dizziness., Disp: 90 tablet, Rfl: 1 .  meloxicam (MOBIC) 15 MG tablet, Take 15 mg by mouth daily., Disp: , Rfl:  .  methocarbamol (ROBAXIN) 750 MG tablet, Take 1 tablet (750 mg total) by mouth every 6 (six) hours as needed for muscle spasms., Disp: 120 tablet, Rfl: 5 .  montelukast (SINGULAIR) 10 MG tablet, Take 1 tablet (10 mg total) by mouth at bedtime., Disp: 90 tablet, Rfl: 3 .  OLANZapine (ZYPREXA) 10 MG tablet, Take 1 tablet (10 mg total) by mouth at bedtime., Disp: 30 tablet, Rfl: 0 .  omeprazole (PRILOSEC) 40 MG capsule, Take 40 mg by mouth every evening. , Disp: , Rfl:  .  prochlorperazine (COMPAZINE) 10 MG tablet, Take 1 tablet (10 mg total) by mouth every 6 (six) hours as needed (Nausea or vomiting)., Disp: 30 tablet, Rfl: 1 .  tiZANidine (ZANAFLEX) 4 MG tablet, Take 4 mg by mouth at bedtime. , Disp: , Rfl:  .  traMADol (ULTRAM) 50 MG tablet, Take 1-2 tablets (50-100 mg total) by mouth every 6 (six) hours as needed., Disp: 20 tablet, Rfl: 0 .  traZODone (DESYREL) 50 MG tablet, Take 1-1.5 tablets (50-75 mg total) by mouth  at bedtime as needed for sleep. (Patient taking differently:  Take 50 mg by mouth at bedtime as needed for sleep. ), Disp: 135 tablet, Rfl: 4  Physical exam:  Vitals:   04/30/19 1112  BP: 132/76  Pulse: 80  Temp: (!) 97.5 F (36.4 C)  TempSrc: Tympanic  Weight: 180 lb 1.6 oz (81.7 kg)   Physical Exam Constitutional:      General: She is not in acute distress. HENT:     Head: Normocephalic and atraumatic.  Eyes:     Pupils: Pupils are equal, round, and reactive to light.  Cardiovascular:     Rate and Rhythm: Normal rate and regular rhythm.     Heart sounds: Normal heart sounds.  Pulmonary:     Effort: Pulmonary effort is normal.     Breath sounds: Normal breath sounds.  Abdominal:     General: Bowel sounds are normal.     Palpations: Abdomen is soft.  Musculoskeletal:     Cervical back: Normal range of motion.  Skin:    General: Skin is warm and dry.  Neurological:     Mental Status: She is alert and oriented to person, place, and time.      CMP Latest Ref Rng & Units 04/30/2019  Glucose 70 - 99 mg/dL 115(H)  BUN 6 - 20 mg/dL 19  Creatinine 0.44 - 1.00 mg/dL 0.68  Sodium 135 - 145 mmol/L 139  Potassium 3.5 - 5.1 mmol/L 3.6  Chloride 98 - 111 mmol/L 105  CO2 22 - 32 mmol/L 25  Calcium 8.9 - 10.3 mg/dL 9.0  Total Protein 6.5 - 8.1 g/dL 6.5  Total Bilirubin 0.3 - 1.2 mg/dL 0.3  Alkaline Phos 38 - 126 U/L 75  AST 15 - 41 U/L 22  ALT 0 - 44 U/L 23   CBC Latest Ref Rng & Units 04/30/2019  WBC 4.0 - 10.5 K/uL 14.9(H)  Hemoglobin 12.0 - 15.0 g/dL 11.6(L)  Hematocrit 36.0 - 46.0 % 34.3(L)  Platelets 150 - 400 K/uL 231    Assessment and plan- Patient is a 54 y.o. female with prior history of polycythemia now with newly diagnosed invasive mammary carcinoma pathologic prognostic stage Ia of the left breast pT1b pN1 acM0 ER/PR positive HER-2/neu negative. She is s/p lumpectomywith high risk MammaPrint score. She is here for on treatment assessment prior to cycle 4 of dose  dense AC chemotherapy  Counts okay to proceed with cycle 4 of dose dense AC chemotherapy today with on pro-Neulasta support.  I will see her back in 2 weeks time for cycle 1 of Taxol.  Chemo-induced fatigue: Encourage physical activity.  Likely to improve after [facility] chemotherapy is done.  Continue to monitor  Chemo-induced nausea: Currently well controlled with Zyprexa.  She has been getting some vivid dreams with zyprexa but it has been beneficial to try to wean her off from Zyprexa if nausea is not bad with Taxol   Visit Diagnosis 1. Malignant neoplasm of upper-inner quadrant of left breast in female, estrogen receptor positive ([place])   2. Encounter for antineoplastic chemotherapy      Dr. Randa Evens, MD, MPH [facility] [phone] 05/02/2019 9:45 AM

## 2019-05-08 ENCOUNTER — Other Ambulatory Visit: Payer: Self-pay | Admitting: Oncology

## 2019-05-10 ENCOUNTER — Other Ambulatory Visit: Payer: Self-pay | Admitting: Oncology

## 2019-05-10 DIAGNOSIS — M5412 Radiculopathy, cervical region: Secondary | ICD-10-CM | POA: Diagnosis not present

## 2019-05-10 DIAGNOSIS — M5030 Other cervical disc degeneration, unspecified cervical region: Secondary | ICD-10-CM | POA: Diagnosis not present

## 2019-05-13 ENCOUNTER — Other Ambulatory Visit: Payer: Self-pay

## 2019-05-13 ENCOUNTER — Encounter: Payer: Self-pay | Admitting: Oncology

## 2019-05-13 NOTE — Progress Notes (Signed)
Patient stated that she had been doing well except that sometimes she has had dysphagia.

## 2019-05-14 ENCOUNTER — Inpatient Hospital Stay: Payer: Medicaid Other | Attending: Oncology | Admitting: Oncology

## 2019-05-14 ENCOUNTER — Inpatient Hospital Stay: Payer: Medicaid Other

## 2019-05-14 ENCOUNTER — Other Ambulatory Visit: Payer: Self-pay

## 2019-05-14 VITALS — BP 115/78 | HR 76 | Temp 97.4°F

## 2019-05-14 VITALS — BP 128/81 | HR 79 | Temp 97.7°F | Wt 180.4 lb

## 2019-05-14 DIAGNOSIS — Z170 Estrogen receptor positive status [ER+]: Secondary | ICD-10-CM

## 2019-05-14 DIAGNOSIS — Z87891 Personal history of nicotine dependence: Secondary | ICD-10-CM | POA: Diagnosis not present

## 2019-05-14 DIAGNOSIS — Z8349 Family history of other endocrine, nutritional and metabolic diseases: Secondary | ICD-10-CM

## 2019-05-14 DIAGNOSIS — J45909 Unspecified asthma, uncomplicated: Secondary | ICD-10-CM | POA: Diagnosis not present

## 2019-05-14 DIAGNOSIS — R110 Nausea: Secondary | ICD-10-CM | POA: Insufficient documentation

## 2019-05-14 DIAGNOSIS — K219 Gastro-esophageal reflux disease without esophagitis: Secondary | ICD-10-CM | POA: Diagnosis not present

## 2019-05-14 DIAGNOSIS — R0981 Nasal congestion: Secondary | ICD-10-CM | POA: Insufficient documentation

## 2019-05-14 DIAGNOSIS — C50212 Malignant neoplasm of upper-inner quadrant of left female breast: Secondary | ICD-10-CM | POA: Insufficient documentation

## 2019-05-14 DIAGNOSIS — T451X5A Adverse effect of antineoplastic and immunosuppressive drugs, initial encounter: Secondary | ICD-10-CM | POA: Diagnosis not present

## 2019-05-14 DIAGNOSIS — R5383 Other fatigue: Secondary | ICD-10-CM | POA: Insufficient documentation

## 2019-05-14 DIAGNOSIS — Z79899 Other long term (current) drug therapy: Secondary | ICD-10-CM | POA: Diagnosis not present

## 2019-05-14 DIAGNOSIS — Z791 Long term (current) use of non-steroidal anti-inflammatories (NSAID): Secondary | ICD-10-CM | POA: Diagnosis not present

## 2019-05-14 DIAGNOSIS — D6481 Anemia due to antineoplastic chemotherapy: Secondary | ICD-10-CM | POA: Diagnosis not present

## 2019-05-14 DIAGNOSIS — Z8042 Family history of malignant neoplasm of prostate: Secondary | ICD-10-CM

## 2019-05-14 DIAGNOSIS — Z803 Family history of malignant neoplasm of breast: Secondary | ICD-10-CM | POA: Diagnosis not present

## 2019-05-14 DIAGNOSIS — F329 Major depressive disorder, single episode, unspecified: Secondary | ICD-10-CM | POA: Diagnosis not present

## 2019-05-14 DIAGNOSIS — H9201 Otalgia, right ear: Secondary | ICD-10-CM | POA: Insufficient documentation

## 2019-05-14 DIAGNOSIS — R600 Localized edema: Secondary | ICD-10-CM | POA: Insufficient documentation

## 2019-05-14 DIAGNOSIS — M7989 Other specified soft tissue disorders: Secondary | ICD-10-CM | POA: Insufficient documentation

## 2019-05-14 DIAGNOSIS — F419 Anxiety disorder, unspecified: Secondary | ICD-10-CM | POA: Diagnosis not present

## 2019-05-14 DIAGNOSIS — Z5111 Encounter for antineoplastic chemotherapy: Secondary | ICD-10-CM | POA: Diagnosis present

## 2019-05-14 DIAGNOSIS — R140 Abdominal distension (gaseous): Secondary | ICD-10-CM | POA: Diagnosis not present

## 2019-05-14 DIAGNOSIS — Z95828 Presence of other vascular implants and grafts: Secondary | ICD-10-CM

## 2019-05-14 LAB — CBC WITH DIFFERENTIAL/PLATELET
Abs Immature Granulocytes: 1.28 10*3/uL — ABNORMAL HIGH (ref 0.00–0.07)
Basophils Absolute: 0.2 10*3/uL — ABNORMAL HIGH (ref 0.0–0.1)
Basophils Relative: 2 %
Eosinophils Absolute: 0 10*3/uL (ref 0.0–0.5)
Eosinophils Relative: 0 %
HCT: 35.8 % — ABNORMAL LOW (ref 36.0–46.0)
Hemoglobin: 12 g/dL (ref 12.0–15.0)
Immature Granulocytes: 10 %
Lymphocytes Relative: 7 %
Lymphs Abs: 0.9 10*3/uL (ref 0.7–4.0)
MCH: 32.4 pg (ref 26.0–34.0)
MCHC: 33.5 g/dL (ref 30.0–36.0)
MCV: 96.8 fL (ref 80.0–100.0)
Monocytes Absolute: 1.6 10*3/uL — ABNORMAL HIGH (ref 0.1–1.0)
Monocytes Relative: 12 %
Neutro Abs: 8.6 10*3/uL — ABNORMAL HIGH (ref 1.7–7.7)
Neutrophils Relative %: 69 %
Platelets: 218 10*3/uL (ref 150–400)
RBC: 3.7 MIL/uL — ABNORMAL LOW (ref 3.87–5.11)
RDW: 15.1 % (ref 11.5–15.5)
WBC: 12.5 10*3/uL — ABNORMAL HIGH (ref 4.0–10.5)
nRBC: 0.4 % — ABNORMAL HIGH (ref 0.0–0.2)

## 2019-05-14 LAB — COMPREHENSIVE METABOLIC PANEL
ALT: 19 U/L (ref 0–44)
AST: 21 U/L (ref 15–41)
Albumin: 3.5 g/dL (ref 3.5–5.0)
Alkaline Phosphatase: 68 U/L (ref 38–126)
Anion gap: 9 (ref 5–15)
BUN: 21 mg/dL — ABNORMAL HIGH (ref 6–20)
CO2: 24 mmol/L (ref 22–32)
Calcium: 8.8 mg/dL — ABNORMAL LOW (ref 8.9–10.3)
Chloride: 105 mmol/L (ref 98–111)
Creatinine, Ser: 0.61 mg/dL (ref 0.44–1.00)
GFR calc Af Amer: >60 mL/min (ref >60)
GFR calc non Af Amer: >60 mL/min (ref >60)
Glucose, Bld: 132 mg/dL — ABNORMAL HIGH (ref 70–99)
Potassium: 3.6 mmol/L (ref 3.5–5.1)
Sodium: 138 mmol/L (ref 135–145)
Total Bilirubin: 0.6 mg/dL (ref 0.3–1.2)
Total Protein: 6.4 g/dL — ABNORMAL LOW (ref 6.5–8.1)

## 2019-05-14 MED ORDER — HEPARIN SOD (PORK) LOCK FLUSH 100 UNIT/ML IV SOLN
INTRAVENOUS | Status: AC
  Filled 2019-05-14: qty 5

## 2019-05-14 MED ORDER — DIPHENHYDRAMINE HCL 50 MG/ML IJ SOLN
50.0000 mg | Freq: Once | INTRAMUSCULAR | Status: AC
  Administered 2019-05-14: 50 mg via INTRAVENOUS
  Filled 2019-05-14: qty 1

## 2019-05-14 MED ORDER — SODIUM CHLORIDE 0.9 % IV SOLN
80.0000 mg/m2 | Freq: Once | INTRAVENOUS | Status: AC
  Administered 2019-05-14: 12:00:00 150 mg via INTRAVENOUS
  Filled 2019-05-14: qty 25

## 2019-05-14 MED ORDER — HEPARIN SOD (PORK) LOCK FLUSH 100 UNIT/ML IV SOLN
500.0000 [IU] | Freq: Once | INTRAVENOUS | Status: AC | PRN
  Administered 2019-05-14: 500 [IU]
  Filled 2019-05-14: qty 5

## 2019-05-14 MED ORDER — SODIUM CHLORIDE 0.9 % IV SOLN
20.0000 mg | Freq: Once | INTRAVENOUS | Status: AC
  Administered 2019-05-14: 20 mg via INTRAVENOUS
  Filled 2019-05-14: qty 20

## 2019-05-14 MED ORDER — SODIUM CHLORIDE 0.9% FLUSH
10.0000 mL | Freq: Once | INTRAVENOUS | Status: AC
  Administered 2019-05-14: 10 mL via INTRAVENOUS
  Filled 2019-05-14: qty 10

## 2019-05-14 MED ORDER — FAMOTIDINE IN NACL 20-0.9 MG/50ML-% IV SOLN
20.0000 mg | Freq: Once | INTRAVENOUS | Status: AC
  Administered 2019-05-14: 20 mg via INTRAVENOUS
  Filled 2019-05-14: qty 50

## 2019-05-14 MED ORDER — SODIUM CHLORIDE 0.9 % IV SOLN
Freq: Once | INTRAVENOUS | Status: AC
  Filled 2019-05-14: qty 250

## 2019-05-15 NOTE — Progress Notes (Signed)
Hematology/Oncology Consult note [facility]  [phone] [phone]  Patient Care Team: McLean-Scocuzza, Nino Glow, MD as PCP - General (Internal Medicine) Rico Junker, RN as Registered Nurse   Name of the patient: Leah [name]  [id number]  02-10-1966   Date of visit: 05/15/19  Diagnosis- pathological prognostic stage Ia invasive mammary carcinomapT1b pN1 cM0 ER/PR positive HER-2/neu negative s/p lumpectomy  Chief complaint/ Reason for visit-on treatment assessment prior to cycle 1 of weekly Taxol chemotherapy  Heme/Onc history: Patient is a 54 year old postmenopausal female G2 P2 L2 who recently underwent screening bilateral mammogram which showed area of concern in her left breast. This was followed by an ultrasound and Core biopsy. Ultrasound showed 1 x 1.1 x 0.7 cm hypoechoic mass at the 10:30 position of the left breast. Left axilla appeared normal. 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  Final pathology showed 9 mm grade 1 invasive mammary carcinoma1 out of 2 lymph nodes involved with macro metastatic carcinoma 3.5 mm. Anterior margin was focally positive for which patientunderwent reexcision surgery with negative marginsPT1BPN1a (sn)  MammaPrint score came back as high risk with a risk of recurrence of 29% at 10 years without chemotherapy  Interval history-reports ongoing fatigue.  Has a metallic taste in her mouth and appetite is not great.  Zyprexa has been helping her with nausea.  ECOG PS- 1 Pain scale- 0   Review of systems- Review of Systems  Constitutional: Positive for malaise/fatigue. Negative for chills, fever and weight loss.  HENT: Negative for congestion, ear discharge and nosebleeds.   Eyes: Negative for blurred vision.  Respiratory: Negative for cough, hemoptysis, sputum production, shortness of breath and wheezing.     Cardiovascular: Negative for chest pain, palpitations, orthopnea and claudication.  Gastrointestinal: Negative for abdominal pain, blood in stool, constipation, diarrhea, heartburn, melena, nausea and vomiting.  Genitourinary: Negative for dysuria, flank pain, frequency, hematuria and urgency.  Musculoskeletal: Negative for back pain, joint pain and myalgias.  Skin: Negative for rash.  Neurological: Negative for dizziness, tingling, focal weakness, seizures, weakness and headaches.  Endo/Heme/Allergies: Does not bruise/bleed easily.  Psychiatric/Behavioral: Negative for depression and suicidal ideas. The patient does not have insomnia.       Allergies  Allergen Reactions  . Hydrocodone-Acetaminophen Hives    All over her body.  . Lactose Intolerance (Gi) Other (See Comments)    Bloating and GI upset  . Penicillins Rash    Did it involve swelling of the face/tongue/throat, SOB, or low BP? Unknown Did it involve sudden or severe rash/hives, skin peeling, or any reaction on the inside of your mouth or nose? Yes Did you need to seek medical attention at a hospital or doctor's office? Unknown When did it last happen? Childhood reaction. If all above answers are "NO", may proceed with cephalosporin use.   . Sulfa Antibiotics Rash    Full body rash      Past Medical History:  Diagnosis Date  . Anxiety   . Asthma    as a child  . Breast cancer ([place])   . Carpal tunnel syndrome    R hand   . Colon polyps   . Depression   . Eosinophilic esophagitis    noted in [place] with GI path report 09/29/14   . Esophageal stricture    s/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 Edwin Cap   . Family history of breast cancer   .  Family history of prostate cancer   . Herniated disc, cervical    s/p epidural injection  . History of kidney stones   . Hyperlipidemia   . Low back pain   . Migraines      Past Surgical History:  Procedure Laterality Date  . BREAST  BIOPSY Left 12/25/2018   [place] BX, PATH PENDING  . BREAST LUMPECTOMY WITH NEEDLE LOCALIZATION AND AXILLARY SENTINEL LYMPH NODE BX Left 01/16/2019   Procedure: LEFT BREAST LUMPECTOMY WITH NEEDLE LOCALIZATION AND SENTINEL LYMPH NODE BIOPSY;  Surgeon: Jovita Kussmaul, MD;  Location: [facility] ORS;  Service: General;  Laterality: Left;  . CESAREAN SECTION    . COLONOSCOPY WITH PROPOFOL N/A 03/27/2017   Procedure: COLONOSCOPY WITH PROPOFOL;  Surgeon: Jonathon Bellows, MD;  Location: [facility] ENDOSCOPY;  Service: Gastroenterology;  Laterality: N/A;  . ESOPHAGOGASTRODUODENOSCOPY    . ESOPHAGOGASTRODUODENOSCOPY (EGD) WITH PROPOFOL N/A 03/27/2017   Procedure: ESOPHAGOGASTRODUODENOSCOPY (EGD) WITH PROPOFOL WITH DILATION;  Surgeon: Jonathon Bellows, MD;  Location: [facility] ENDOSCOPY;  Service: Gastroenterology;  Laterality: N/A;  . PORTACATH PLACEMENT N/A 02/15/2019   Procedure: INSERTION PORT-A-CATH WITH POSSIBLE ULTRASOUND;  Surgeon: Jovita Kussmaul, MD;  Location: [facility] ORS;  Service: General;  Laterality: N/A;  . RE-EXCISION OF BREAST CANCER,SUPERIOR MARGINS Left 02/15/2019   Procedure: RE-EXCISION OF LEFT BREAST CANCER ANTERIOR MARGINS;  Surgeon: Jovita Kussmaul, MD;  Location: [facility] ORS;  Service: General;  Laterality: Left;  . THROAT SURGERY  2015  . UMBILICAL HERNIA REPAIR  2006    x 2   w mesh per pt    Social History   Socioeconomic History  . Marital status: Significant Other    Spouse name: Not on file  . Number of children: Not on file  . Years of education: Not on file  . Highest education level: Not on file  Occupational History  . Not on file  Tobacco Use  . Smoking status: Former Smoker    Packs/day: 1.00    Years: 20.00    Pack years: 20.00    Quit date: 06/02/2006    Years since quitting: 12.9  . Smokeless tobacco: Never Used  Substance and Sexual Activity  . Alcohol use: Yes    Alcohol/week: 2.0 - 3.0 standard drinks    Types: 2 - 3 Standard drinks or equivalent per week    Comment: socially  . Drug  use: No  . Sexual activity: Yes    Partners: Male  Other Topics Concern  . 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 As of 02/26/17    Divorced.    Former smoker 20+ years quit in 1997 then started again for 5 years and quit 10-12 years ago as of 03/08/17    Social Determinants of Health   Financial Resource Strain:   . Difficulty of Paying Living Expenses: Not on file  Food Insecurity:   . Worried About Charity fundraiser in the Last Year: Not on file  . Ran Out of Food in the Last Year: Not on file  Transportation Needs:   . Lack of Transportation (Medical): Not on file  . Lack of Transportation (Non-Medical): Not on file  Physical Activity:   . Days of Exercise per Week: Not on file  . Minutes of Exercise per Session: Not on file  Stress:   .  Feeling of Stress : Not on file  Social Connections:   . Frequency of Communication with Friends and Family: Not on file  . Frequency of Social Gatherings with Friends and Family: Not on file  . Attends Religious Services: Not on file  . Active Member of Clubs or Organizations: Not on file  . Attends Archivist Meetings: Not on file  . Marital Status: Not on file  Intimate Partner Violence:   . Fear of Current or Ex-Partner: Not on file  . Emotionally Abused: Not on file  . Physically Abused: Not on file  . Sexually Abused: Not on file    Family History  Problem Relation Age of Onset  . Breast cancer Mother        dx late 66s  . Thyroid disease Mother   . Colon polyps Mother   . Prostate cancer Father        dx 3  . Breast cancer Maternal Grandmother        dx late 72s  . Cancer Maternal Uncle        possibly, unsure type     Current Outpatient Medications:  .  Cholecalciferol (VITAMIN D-3) 125 MCG (5000 UT) TABS, Take 5,000 Units by mouth daily., Disp: , Rfl:  .  Cyanocobalamin  (VITAMIN B-12 PO), Take 1 tablet by mouth daily., Disp: , Rfl:  .  fluticasone (FLONASE) 50 MCG/ACT nasal spray, Place 2 sprays into both nostrils daily. Max 2 sprays, Disp: 16 g, Rfl: 12 .  gabapentin (NEURONTIN) 300 MG capsule, Take 300 mg by mouth at bedtime., Disp: , Rfl:  .  lidocaine-prilocaine (EMLA) cream, Apply to affected area once, Disp: 30 g, Rfl: 3 .  meclizine (ANTIVERT) 12.5 MG tablet, Take 1 tablet (12.5 mg total) by mouth 3 (three) times daily as needed for dizziness., Disp: 90 tablet, Rfl: 1 .  meloxicam (MOBIC) 15 MG tablet, Take 15 mg by mouth daily., Disp: , Rfl:  .  methocarbamol (ROBAXIN) 750 MG tablet, Take 1 tablet (750 mg total) by mouth every 6 (six) hours as needed for muscle spasms., Disp: 120 tablet, Rfl: 5 .  montelukast (SINGULAIR) 10 MG tablet, Take 1 tablet (10 mg total) by mouth at bedtime., Disp: 90 tablet, Rfl: 3 .  OLANZapine (ZYPREXA) 10 MG tablet, TAKE 1 TABLET BY MOUTH EVERYDAY AT BEDTIME, Disp: 90 tablet, Rfl: 1 .  omeprazole (PRILOSEC) 40 MG capsule, Take 40 mg by mouth every evening. , Disp: , Rfl:  .  tiZANidine (ZANAFLEX) 4 MG tablet, Take 4 mg by mouth at bedtime. , Disp: , Rfl:  .  traMADol (ULTRAM) 50 MG tablet, Take 1-2 tablets (50-100 mg total) by mouth every 6 (six) hours as needed., Disp: 20 tablet, Rfl: 0 .  traZODone (DESYREL) 50 MG tablet, Take 1-1.5 tablets (50-75 mg total) by mouth at bedtime as needed for sleep. (Patient taking differently: Take 50 mg by mouth at bedtime as needed for sleep. ), Disp: 135 tablet, Rfl: 4 .  acetaminophen (TYLENOL) 500 MG tablet, Take 500-1,000 mg by mouth every 6 (six) hours as needed (for pain.)., Disp: , Rfl:   Physical exam:  Vitals:   05/14/19 1001  BP: 128/81  Pulse: 79  Temp: 97.7 F (36.5 C)  TempSrc: Tympanic  Weight: 180 lb 6.4 oz (81.8 kg)   Physical Exam Constitutional:      General: She is not in acute distress. HENT:     Head: Normocephalic and atraumatic.  Eyes:  Pupils: Pupils  are equal, round, and reactive to light.  Cardiovascular:     Rate and Rhythm: Normal rate and regular rhythm.     Heart sounds: Normal heart sounds.  Pulmonary:     Effort: Pulmonary effort is normal.     Breath sounds: Normal breath sounds.  Abdominal:     General: Bowel sounds are normal.     Palpations: Abdomen is soft.  Musculoskeletal:     Cervical back: Normal range of motion.  Skin:    General: Skin is warm and dry.  Neurological:     Mental Status: She is alert and oriented to person, place, and time.      CMP Latest Ref Rng & Units 05/14/2019  Glucose 70 - 99 mg/dL 132(H)  BUN 6 - 20 mg/dL 21(H)  Creatinine 0.44 - 1.00 mg/dL 0.61  Sodium 135 - 145 mmol/L 138  Potassium 3.5 - 5.1 mmol/L 3.6  Chloride 98 - 111 mmol/L 105  CO2 22 - 32 mmol/L 24  Calcium 8.9 - 10.3 mg/dL 8.8(L)  Total Protein 6.5 - 8.1 g/dL 6.4(L)  Total Bilirubin 0.3 - 1.2 mg/dL 0.6  Alkaline Phos 38 - 126 U/L 68  AST 15 - 41 U/L 21  ALT 0 - 44 U/L 19   CBC Latest Ref Rng & Units 05/14/2019  WBC 4.0 - 10.5 K/uL 12.5(H)  Hemoglobin 12.0 - 15.0 g/dL 12.0  Hematocrit 36.0 - 46.0 % 35.8(L)  Platelets 150 - 400 K/uL 218     Assessment and plan- Patient is a 54 y.o. female  with prior history of polycythemia now with newly diagnosed invasive mammary carcinoma pathologic prognostic stage Ia of the left breast pT1b pN1 acM0 ER/PR positive HER-2/neu negative. She is s/p lumpectomywith high risk MammaPrint score. She is s/p 4 cycles of dose dense AC chemotherapy and here for on treatment assessment prior to cycle 1 of weekly Taxol chemotherapy  Counts okay to proceed with cycle 1 of weekly Taxol chemotherapy today.  She will directly proceed for cycle 2 next week and I will see her back in 2 weeks for cycle 3.  Again discussed risks and benefits of Taxol including all but not limited to nausea, vomiting, low blood counts, risk of peripheral neuropathy and infusion reactions.  Treatment is being given with a  curative intent.  Patient understands and agrees to proceed as planned.  Chemo-induced nausea: Explained to the patient that nausea is likely to improve as Taxol is less likely to do that as compared to dose dense AC.  She will slowly try to wean her off from Zyprexa.-   Visit Diagnosis 1. Encounter for antineoplastic chemotherapy   2. Chemotherapy-induced nausea   3. Malignant neoplasm of upper-inner quadrant of left breast in female, estrogen receptor positive ([place])      Dr. Randa Evens, MD, MPH [facility] [phone] 05/15/2019 4:50 PM

## 2019-05-21 ENCOUNTER — Other Ambulatory Visit: Payer: Self-pay | Admitting: Oncology

## 2019-05-21 ENCOUNTER — Inpatient Hospital Stay: Payer: Medicaid Other

## 2019-05-21 ENCOUNTER — Other Ambulatory Visit: Payer: Self-pay

## 2019-05-21 VITALS — BP 136/76 | HR 91 | Temp 98.6°F | Resp 18

## 2019-05-21 DIAGNOSIS — Z5111 Encounter for antineoplastic chemotherapy: Secondary | ICD-10-CM | POA: Diagnosis not present

## 2019-05-21 DIAGNOSIS — C50212 Malignant neoplasm of upper-inner quadrant of left female breast: Secondary | ICD-10-CM

## 2019-05-21 DIAGNOSIS — Z95828 Presence of other vascular implants and grafts: Secondary | ICD-10-CM

## 2019-05-21 LAB — CBC WITH DIFFERENTIAL/PLATELET
Abs Immature Granulocytes: 0.14 10*3/uL — ABNORMAL HIGH (ref 0.00–0.07)
Basophils Absolute: 0.1 10*3/uL (ref 0.0–0.1)
Basophils Relative: 2 %
Eosinophils Absolute: 0.1 10*3/uL (ref 0.0–0.5)
Eosinophils Relative: 1 %
HCT: 34.3 % — ABNORMAL LOW (ref 36.0–46.0)
Hemoglobin: 11.5 g/dL — ABNORMAL LOW (ref 12.0–15.0)
Immature Granulocytes: 2 %
Lymphocytes Relative: 8 %
Lymphs Abs: 0.7 10*3/uL (ref 0.7–4.0)
MCH: 32.7 pg (ref 26.0–34.0)
MCHC: 33.5 g/dL (ref 30.0–36.0)
MCV: 97.4 fL (ref 80.0–100.0)
Monocytes Absolute: 1.2 10*3/uL — ABNORMAL HIGH (ref 0.1–1.0)
Monocytes Relative: 14 %
Neutro Abs: 6.4 10*3/uL (ref 1.7–7.7)
Neutrophils Relative %: 73 %
Platelets: 344 10*3/uL (ref 150–400)
RBC: 3.52 MIL/uL — ABNORMAL LOW (ref 3.87–5.11)
RDW: 14.6 % (ref 11.5–15.5)
WBC: 8.6 10*3/uL (ref 4.0–10.5)
nRBC: 0 % (ref 0.0–0.2)

## 2019-05-21 LAB — COMPREHENSIVE METABOLIC PANEL
ALT: 26 U/L (ref 0–44)
AST: 24 U/L (ref 15–41)
Albumin: 3.4 g/dL — ABNORMAL LOW (ref 3.5–5.0)
Alkaline Phosphatase: 49 U/L (ref 38–126)
Anion gap: 9 (ref 5–15)
BUN: 19 mg/dL (ref 6–20)
CO2: 22 mmol/L (ref 22–32)
Calcium: 8.4 mg/dL — ABNORMAL LOW (ref 8.9–10.3)
Chloride: 103 mmol/L (ref 98–111)
Creatinine, Ser: 0.51 mg/dL (ref 0.44–1.00)
GFR calc Af Amer: >60 mL/min (ref >60)
GFR calc non Af Amer: >60 mL/min (ref >60)
Glucose, Bld: 111 mg/dL — ABNORMAL HIGH (ref 70–99)
Potassium: 3.9 mmol/L (ref 3.5–5.1)
Sodium: 134 mmol/L — ABNORMAL LOW (ref 135–145)
Total Bilirubin: 0.7 mg/dL (ref 0.3–1.2)
Total Protein: 6.2 g/dL — ABNORMAL LOW (ref 6.5–8.1)

## 2019-05-21 MED ORDER — SODIUM CHLORIDE 0.9 % IV SOLN
80.0000 mg/m2 | Freq: Once | INTRAVENOUS | Status: AC
  Administered 2019-05-21: 150 mg via INTRAVENOUS
  Filled 2019-05-21: qty 25

## 2019-05-21 MED ORDER — SODIUM CHLORIDE 0.9% FLUSH
10.0000 mL | Freq: Once | INTRAVENOUS | Status: AC
  Administered 2019-05-21: 10 mL via INTRAVENOUS
  Filled 2019-05-21: qty 10

## 2019-05-21 MED ORDER — SODIUM CHLORIDE 0.9 % IV SOLN
Freq: Once | INTRAVENOUS | Status: AC
  Filled 2019-05-21: qty 250

## 2019-05-21 MED ORDER — DIPHENHYDRAMINE HCL 50 MG/ML IJ SOLN
50.0000 mg | Freq: Once | INTRAMUSCULAR | Status: AC
  Administered 2019-05-21: 09:00:00 50 mg via INTRAVENOUS
  Filled 2019-05-21: qty 1

## 2019-05-21 MED ORDER — HEPARIN SOD (PORK) LOCK FLUSH 100 UNIT/ML IV SOLN
500.0000 [IU] | Freq: Once | INTRAVENOUS | Status: AC | PRN
  Administered 2019-05-21: 500 [IU]
  Filled 2019-05-21: qty 5

## 2019-05-21 MED ORDER — SODIUM CHLORIDE 0.9 % IV SOLN
20.0000 mg | Freq: Once | INTRAVENOUS | Status: AC
  Administered 2019-05-21: 20 mg via INTRAVENOUS
  Filled 2019-05-21: qty 20

## 2019-05-21 MED ORDER — FAMOTIDINE IN NACL 20-0.9 MG/50ML-% IV SOLN
20.0000 mg | Freq: Once | INTRAVENOUS | Status: AC
  Administered 2019-05-21: 20 mg via INTRAVENOUS
  Filled 2019-05-21: qty 50

## 2019-05-21 NOTE — Progress Notes (Signed)
pt reports since last week's treatment an increase in "sinus issues" and dried blood when blowing her nose, an increase in blurred vision and increase in dry mouth. Dr. Janese Banks aware. Per Dr. Janese Banks okay to proceed with Taxol at this time. Beckey Rutter NP to assess pt in infusion.   1136: Beckey Rutter at chairside to talk with pt about symptoms and plan of care.

## 2019-05-22 ENCOUNTER — Encounter: Payer: Self-pay | Admitting: *Deleted

## 2019-05-28 ENCOUNTER — Inpatient Hospital Stay: Payer: Medicaid Other

## 2019-05-28 ENCOUNTER — Encounter: Payer: Self-pay | Admitting: Oncology

## 2019-05-28 ENCOUNTER — Inpatient Hospital Stay (HOSPITAL_BASED_OUTPATIENT_CLINIC_OR_DEPARTMENT_OTHER): Payer: Medicaid Other | Admitting: Oncology

## 2019-05-28 ENCOUNTER — Other Ambulatory Visit: Payer: Self-pay

## 2019-05-28 VITALS — BP 131/83 | HR 92 | Temp 97.8°F | Wt 182.0 lb

## 2019-05-28 DIAGNOSIS — C50212 Malignant neoplasm of upper-inner quadrant of left female breast: Secondary | ICD-10-CM

## 2019-05-28 DIAGNOSIS — Z5111 Encounter for antineoplastic chemotherapy: Secondary | ICD-10-CM | POA: Diagnosis not present

## 2019-05-28 DIAGNOSIS — Z95828 Presence of other vascular implants and grafts: Secondary | ICD-10-CM

## 2019-05-28 DIAGNOSIS — Z170 Estrogen receptor positive status [ER+]: Secondary | ICD-10-CM

## 2019-05-28 LAB — CBC WITH DIFFERENTIAL/PLATELET
Abs Immature Granulocytes: 0.21 10*3/uL — ABNORMAL HIGH (ref 0.00–0.07)
Basophils Absolute: 0.1 10*3/uL (ref 0.0–0.1)
Basophils Relative: 1 %
Eosinophils Absolute: 0.4 10*3/uL (ref 0.0–0.5)
Eosinophils Relative: 5 %
HCT: 35.5 % — ABNORMAL LOW (ref 36.0–46.0)
Hemoglobin: 12.4 g/dL (ref 12.0–15.0)
Immature Granulocytes: 3 %
Lymphocytes Relative: 11 %
Lymphs Abs: 0.8 10*3/uL (ref 0.7–4.0)
MCH: 32.9 pg (ref 26.0–34.0)
MCHC: 34.9 g/dL (ref 30.0–36.0)
MCV: 94.2 fL (ref 80.0–100.0)
Monocytes Absolute: 1.2 10*3/uL — ABNORMAL HIGH (ref 0.1–1.0)
Monocytes Relative: 15 %
Neutro Abs: 5 10*3/uL (ref 1.7–7.7)
Neutrophils Relative %: 65 %
Platelets: 343 10*3/uL (ref 150–400)
RBC: 3.77 MIL/uL — ABNORMAL LOW (ref 3.87–5.11)
RDW: 14.6 % (ref 11.5–15.5)
WBC: 7.7 10*3/uL (ref 4.0–10.5)
nRBC: 0 % (ref 0.0–0.2)

## 2019-05-28 LAB — COMPREHENSIVE METABOLIC PANEL
ALT: 30 U/L (ref 0–44)
AST: 27 U/L (ref 15–41)
Albumin: 3.4 g/dL — ABNORMAL LOW (ref 3.5–5.0)
Alkaline Phosphatase: 47 U/L (ref 38–126)
Anion gap: 9 (ref 5–15)
BUN: 24 mg/dL — ABNORMAL HIGH (ref 6–20)
CO2: 24 mmol/L (ref 22–32)
Calcium: 8.7 mg/dL — ABNORMAL LOW (ref 8.9–10.3)
Chloride: 105 mmol/L (ref 98–111)
Creatinine, Ser: 0.7 mg/dL (ref 0.44–1.00)
GFR calc Af Amer: >60 mL/min (ref >60)
GFR calc non Af Amer: >60 mL/min (ref >60)
Glucose, Bld: 117 mg/dL — ABNORMAL HIGH (ref 70–99)
Potassium: 4 mmol/L (ref 3.5–5.1)
Sodium: 138 mmol/L (ref 135–145)
Total Bilirubin: 0.7 mg/dL (ref 0.3–1.2)
Total Protein: 6.2 g/dL — ABNORMAL LOW (ref 6.5–8.1)

## 2019-05-28 MED ORDER — FAMOTIDINE IN NACL 20-0.9 MG/50ML-% IV SOLN
20.0000 mg | Freq: Once | INTRAVENOUS | Status: AC
  Administered 2019-05-28: 20 mg via INTRAVENOUS
  Filled 2019-05-28: qty 50

## 2019-05-28 MED ORDER — SODIUM CHLORIDE 0.9 % IV SOLN
80.0000 mg/m2 | Freq: Once | INTRAVENOUS | Status: AC
  Administered 2019-05-28: 150 mg via INTRAVENOUS
  Filled 2019-05-28: qty 25

## 2019-05-28 MED ORDER — SODIUM CHLORIDE 0.9 % IV SOLN
Freq: Once | INTRAVENOUS | Status: AC
  Filled 2019-05-28: qty 250

## 2019-05-28 MED ORDER — HEPARIN SOD (PORK) LOCK FLUSH 100 UNIT/ML IV SOLN
INTRAVENOUS | Status: AC
  Filled 2019-05-28: qty 5

## 2019-05-28 MED ORDER — DIPHENHYDRAMINE HCL 50 MG/ML IJ SOLN
50.0000 mg | Freq: Once | INTRAMUSCULAR | Status: AC
  Administered 2019-05-28: 11:00:00 50 mg via INTRAVENOUS
  Filled 2019-05-28: qty 1

## 2019-05-28 MED ORDER — SODIUM CHLORIDE 0.9 % IV SOLN
20.0000 mg | Freq: Once | INTRAVENOUS | Status: AC
  Administered 2019-05-28: 20 mg via INTRAVENOUS
  Filled 2019-05-28: qty 20

## 2019-05-28 MED ORDER — FUROSEMIDE 20 MG PO TABS: 20.0000 mg | ORAL_TABLET | ORAL | 0 refills | Status: DC | PRN

## 2019-05-28 MED ORDER — SODIUM CHLORIDE 0.9% FLUSH
10.0000 mL | Freq: Once | INTRAVENOUS | Status: AC
  Administered 2019-05-28: 10:00:00 10 mL via INTRAVENOUS
  Filled 2019-05-28: qty 10

## 2019-05-28 MED ORDER — HEPARIN SOD (PORK) LOCK FLUSH 100 UNIT/ML IV SOLN
500.0000 [IU] | Freq: Once | INTRAVENOUS | Status: AC | PRN
  Administered 2019-05-28: 500 [IU]
  Filled 2019-05-28: qty 5

## 2019-05-28 NOTE — Progress Notes (Signed)
Patient stated that she is scheduled to have an injection on her neck. Patient also stated that she has had abdominal swelling. Patient also stated that she stays tired and fatigued. Patient eats but not hungry. Patient stays with sinus congestion.

## 2019-05-30 ENCOUNTER — Ambulatory Visit (INDEPENDENT_AMBULATORY_CARE_PROVIDER_SITE_OTHER): Payer: Medicaid Other | Admitting: Psychology

## 2019-05-30 DIAGNOSIS — F418 Other specified anxiety disorders: Secondary | ICD-10-CM

## 2019-05-30 NOTE — Progress Notes (Signed)
Hematology/Oncology Consult note [facility], [facility], [facility]  [phone] [phone]  Patient Care Team: McLean-Scocuzza, Nino Glow, MD as PCP - General (Internal Medicine) Rico Junker, RN as Registered Nurse   Name of the patient: Leah [name]  [id number]  Dec 28, 1965   Date of visit: 05/30/19  Diagnosis- pathological prognostic stage Ia invasive mammary carcinomapT1b pN1 cM0 ER/PR positive HER-2/neu negative s/p lumpectomy  Chief complaint/ Reason for visit-on treatment assessment prior to cycle 3 of weekly Taxol chemotherapy  Heme/Onc history: Patient is a 54 year old postmenopausal female G2 P2 L2 who recently underwent screening bilateral mammogram which showed area of concern in her left breast. This was followed by an ultrasound and Core biopsy. Ultrasound showed 1 x 1.1 x 0.7 cm hypoechoic mass at the 10:30 position of the left breast. Left axilla appeared normal. 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  Final pathology showed 9 mm grade 1 invasive mammary carcinoma1 out of 2 lymph nodes involved with macro metastatic carcinoma 3.5 mm. Anterior margin was focally positive for which patientunderwent reexcision surgery with negative marginsPT1BPN1a (sn)  MammaPrint score came back as high risk with a risk of recurrence of 29% at 10 years without chemotherapy   Interval history-she feels bloated and feels like her hands are swollen 1 or 2 days after chemotherapy and gradually settle down.  Appetite is not doing well given the metallic taste in her mouth.  Reports ongoing fatigue  ECOG PS- 1 Pain scale- 0 Opioid associated constipation- no  Review of systems- Review of Systems  Constitutional: Positive for malaise/fatigue. Negative for chills, fever and weight loss.       Metallic taste in mouth  HENT: Negative for congestion, ear discharge and nosebleeds.    Eyes: Negative for blurred vision.  Respiratory: Negative for cough, hemoptysis, sputum production, shortness of breath and wheezing.   Cardiovascular: Negative for chest pain, palpitations, orthopnea and claudication.  Gastrointestinal: Negative for abdominal pain, blood in stool, constipation, diarrhea, heartburn, melena, nausea and vomiting.  Genitourinary: Negative for dysuria, flank pain, frequency, hematuria and urgency.  Musculoskeletal: Negative for back pain, joint pain and myalgias.  Skin: Negative for rash.  Neurological: Negative for dizziness, tingling, focal weakness, seizures, weakness and headaches.  Endo/Heme/Allergies: Does not bruise/bleed easily.  Psychiatric/Behavioral: Negative for depression and suicidal ideas. The patient does not have insomnia.       Allergies  Allergen Reactions  . Hydrocodone-Acetaminophen Hives    All over her body.  . Lactose Intolerance (Gi) Other (See Comments)    Bloating and GI upset  . Penicillins Rash    Did it involve swelling of the face/tongue/throat, SOB, or low BP? Unknown Did it involve sudden or severe rash/hives, skin peeling, or any reaction on the inside of your mouth or nose? Yes Did you need to seek medical attention at a hospital or doctor's office? Unknown When did it last happen? Childhood reaction. If all above answers are "NO", may proceed with cephalosporin use.   . Sulfa Antibiotics Rash    Full body rash      Past Medical History:  Diagnosis Date  . Anxiety   . Asthma    as a child  . Breast cancer ([place])   . Carpal tunnel syndrome    R hand   . Colon polyps   . Depression   . Eosinophilic esophagitis    noted in [place] with GI path report 09/29/14   .  Esophageal stricture    s/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 Edwin Cap   . Family history of breast cancer   . Family history of prostate cancer   . Herniated disc, cervical    s/p epidural injection  . History of kidney  stones   . Hyperlipidemia   . Low back pain   . Migraines      Past Surgical History:  Procedure Laterality Date  . BREAST BIOPSY Left 12/25/2018   [place] BX, PATH PENDING  . BREAST LUMPECTOMY WITH NEEDLE LOCALIZATION AND AXILLARY SENTINEL LYMPH NODE BX Left 01/16/2019   Procedure: LEFT BREAST LUMPECTOMY WITH NEEDLE LOCALIZATION AND SENTINEL LYMPH NODE BIOPSY;  Surgeon: Jovita Kussmaul, MD;  Location: [facility] ORS;  Service: General;  Laterality: Left;  . CESAREAN SECTION    . COLONOSCOPY WITH PROPOFOL N/A 03/27/2017   Procedure: COLONOSCOPY WITH PROPOFOL;  Surgeon: Jonathon Bellows, MD;  Location: [facility] ENDOSCOPY;  Service: Gastroenterology;  Laterality: N/A;  . ESOPHAGOGASTRODUODENOSCOPY    . ESOPHAGOGASTRODUODENOSCOPY (EGD) WITH PROPOFOL N/A 03/27/2017   Procedure: ESOPHAGOGASTRODUODENOSCOPY (EGD) WITH PROPOFOL WITH DILATION;  Surgeon: Jonathon Bellows, MD;  Location: [facility] ENDOSCOPY;  Service: Gastroenterology;  Laterality: N/A;  . PORTACATH PLACEMENT N/A 02/15/2019   Procedure: INSERTION PORT-A-CATH WITH POSSIBLE ULTRASOUND;  Surgeon: Jovita Kussmaul, MD;  Location: [facility] ORS;  Service: General;  Laterality: N/A;  . RE-EXCISION OF BREAST CANCER,SUPERIOR MARGINS Left 02/15/2019   Procedure: RE-EXCISION OF LEFT BREAST CANCER ANTERIOR MARGINS;  Surgeon: Jovita Kussmaul, MD;  Location: [facility] ORS;  Service: General;  Laterality: Left;  . THROAT SURGERY  2015  . UMBILICAL HERNIA REPAIR  2006    x 2   w mesh per pt    Social History   Socioeconomic History  . Marital status: Significant Other    Spouse name: Not on file  . Number of children: Not on file  . Years of education: Not on file  . Highest education level: Not on file  Occupational History  . Not on file  Tobacco Use  . Smoking status: Former Smoker    Packs/day: 1.00    Years: 20.00    Pack years: 20.00    Quit date: 06/02/2006    Years since quitting: 13.0  . Smokeless tobacco: Never Used  Substance and Sexual Activity  . Alcohol use: Yes     Alcohol/week: 2.0 - 3.0 standard drinks    Types: 2 - 3 Standard drinks or equivalent per week    Comment: socially  . Drug use: No  . Sexual activity: Yes    Partners: Male  Other Topics Concern  . 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 As of 02/26/17    Divorced.    Former smoker 20+ years quit in 1997 then started again for 5 years and quit 10-12 years ago as of 03/08/17    Social Determinants of Health   Financial Resource Strain:   . Difficulty of Paying Living Expenses:   Food Insecurity:   . Worried About Charity fundraiser in the Last Year:   . Arboriculturist in the Last Year:   Transportation Needs:   . Film/video editor (Medical):   Marland Kitchen Lack of Transportation (Non-Medical):   Physical Activity:   . Days of Exercise per Week:   .  Minutes of Exercise per Session:   Stress:   . Feeling of Stress :   Social Connections:   . Frequency of Communication with Friends and Family:   . Frequency of Social Gatherings with Friends and Family:   . Attends Religious Services:   . Active Member of Clubs or Organizations:   . Attends Archivist Meetings:   Marland Kitchen Marital Status:   Intimate Partner Violence:   . Fear of Current or Ex-Partner:   . Emotionally Abused:   Marland Kitchen Physically Abused:   . Sexually Abused:     Family History  Problem Relation Age of Onset  . Breast cancer Mother        dx late 6s  . Thyroid disease Mother   . Colon polyps Mother   . Prostate cancer Father        dx 62  . Breast cancer Maternal Grandmother        dx late 69s  . Cancer Maternal Uncle        possibly, unsure type     Current Outpatient Medications:  .  acetaminophen (TYLENOL) 500 MG tablet, Take 500-1,000 mg by mouth every 6 (six) hours as needed (for pain.)., Disp: , Rfl:  .  Cholecalciferol (VITAMIN D-3) 125 MCG (5000 UT) TABS, Take  5,000 Units by mouth daily., Disp: , Rfl:  .  Cyanocobalamin (VITAMIN B-12 PO), Take 1 tablet by mouth daily., Disp: , Rfl:  .  fluticasone (FLONASE) 50 MCG/ACT nasal spray, Place 2 sprays into both nostrils daily. Max 2 sprays, Disp: 16 g, Rfl: 12 .  gabapentin (NEURONTIN) 300 MG capsule, Take 300 mg by mouth at bedtime., Disp: , Rfl:  .  lidocaine-prilocaine (EMLA) cream, Apply to affected area once, Disp: 30 g, Rfl: 3 .  meclizine (ANTIVERT) 12.5 MG tablet, Take 1 tablet (12.5 mg total) by mouth 3 (three) times daily as needed for dizziness., Disp: 90 tablet, Rfl: 1 .  meloxicam (MOBIC) 15 MG tablet, Take 15 mg by mouth daily., Disp: , Rfl:  .  methocarbamol (ROBAXIN) 750 MG tablet, Take 1 tablet (750 mg total) by mouth every 6 (six) hours as needed for muscle spasms., Disp: 120 tablet, Rfl: 5 .  montelukast (SINGULAIR) 10 MG tablet, Take 1 tablet (10 mg total) by mouth at bedtime., Disp: 90 tablet, Rfl: 3 .  OLANZapine (ZYPREXA) 10 MG tablet, TAKE 1 TABLET BY MOUTH EVERYDAY AT BEDTIME, Disp: 90 tablet, Rfl: 1 .  omeprazole (PRILOSEC) 40 MG capsule, Take 40 mg by mouth every evening. , Disp: , Rfl:  .  tiZANidine (ZANAFLEX) 4 MG tablet, Take 4 mg by mouth at bedtime. , Disp: , Rfl:  .  traMADol (ULTRAM) 50 MG tablet, Take 1-2 tablets (50-100 mg total) by mouth every 6 (six) hours as needed., Disp: 20 tablet, Rfl: 0 .  traZODone (DESYREL) 50 MG tablet, Take 1-1.5 tablets (50-75 mg total) by mouth at bedtime as needed for sleep. (Patient taking differently: Take 50 mg by mouth at bedtime as needed for sleep. ), Disp: 135 tablet, Rfl: 4 .  furosemide (LASIX) 20 MG tablet, Take 1 tablet (20 mg total) by mouth as needed., Disp: 20 tablet, Rfl: 0  Physical exam:  Vitals:   05/28/19 1007  BP: 131/83  Pulse: 92  Temp: 97.8 F (36.6 C)  TempSrc: Tympanic  Weight: 182 lb (82.6 kg)   Physical Exam HENT:     Head: Normocephalic and atraumatic.  Eyes:     Pupils: Pupils are  equal, round, and  reactive to light.  Cardiovascular:     Rate and Rhythm: Normal rate and regular rhythm.     Heart sounds: Normal heart sounds.  Pulmonary:     Effort: Pulmonary effort is normal.     Breath sounds: Normal breath sounds.  Abdominal:     General: Bowel sounds are normal.     Palpations: Abdomen is soft.  Musculoskeletal:     Cervical back: Normal range of motion.     Right lower leg: No edema.     Left lower leg: No edema.  Skin:    General: Skin is warm and dry.  Neurological:     Mental Status: She is alert and oriented to person, place, and time.      CMP Latest Ref Rng & Units 05/28/2019  Glucose 70 - 99 mg/dL 117(H)  BUN 6 - 20 mg/dL 24(H)  Creatinine 0.44 - 1.00 mg/dL 0.70  Sodium 135 - 145 mmol/L 138  Potassium 3.5 - 5.1 mmol/L 4.0  Chloride 98 - 111 mmol/L 105  CO2 22 - 32 mmol/L 24  Calcium 8.9 - 10.3 mg/dL 8.7(L)  Total Protein 6.5 - 8.1 g/dL 6.2(L)  Total Bilirubin 0.3 - 1.2 mg/dL 0.7  Alkaline Phos 38 - 126 U/L 47  AST 15 - 41 U/L 27  ALT 0 - 44 U/L 30   CBC Latest Ref Rng & Units 05/28/2019  WBC 4.0 - 10.5 K/uL 7.7  Hemoglobin 12.0 - 15.0 g/dL 12.4  Hematocrit 36.0 - 46.0 % 35.5(L)  Platelets 150 - 400 K/uL 343    No images are attached to the encounter.  No results found.   Assessment and plan- Patient is a 54 y.o. female with prior history of polycythemia now with newly diagnosed invasive mammary carcinoma pathologic prognostic stage Ia of the left breast pT1b pN1 acM0 ER/PR positive HER-2/neu negative. She is s/p lumpectomywith high risk MammaPrint score. She is s/p 4 cycles of dose dense AC chemotherapy.  She is here for on treatment assessment prior to cycle 3 of weekly Taxol chemotherapy  Counseled to proceed with cycle 3 of weekly Taxol chemotherapy today.  She will directly proceed for cycle 4 next week and I will see her back in 2 weeks for cycle 5.  Today I did not notice any bilateral lower extremity edema but patient reports subjective  symptoms of bloating as well as bilateral hand swelling following Taxol.  I explained to her that Lasix may not help the patient would like to see if a time-limited trial of 20 mg Lasix would help her with the symptoms.I will give her lasix 20 mg for as needed basis. She will stop if symptoms are not better after taking the medication   Visit Diagnosis 1. Encounter for antineoplastic chemotherapy   2. Malignant neoplasm of upper-inner quadrant of left breast in female, estrogen receptor positive ([place])      Dr. Randa Evens, MD, MPH [facility] [phone] 05/30/2019 10:31 PM

## 2019-05-31 DIAGNOSIS — M5412 Radiculopathy, cervical region: Secondary | ICD-10-CM | POA: Diagnosis not present

## 2019-05-31 DIAGNOSIS — M5030 Other cervical disc degeneration, unspecified cervical region: Secondary | ICD-10-CM | POA: Diagnosis not present

## 2019-06-04 ENCOUNTER — Inpatient Hospital Stay: Payer: Medicaid Other

## 2019-06-04 VITALS — BP 139/81 | HR 90 | Temp 98.0°F | Resp 18 | Wt 187.4 lb

## 2019-06-04 DIAGNOSIS — C50212 Malignant neoplasm of upper-inner quadrant of left female breast: Secondary | ICD-10-CM

## 2019-06-04 DIAGNOSIS — Z5111 Encounter for antineoplastic chemotherapy: Secondary | ICD-10-CM | POA: Diagnosis not present

## 2019-06-04 LAB — CBC WITH DIFFERENTIAL/PLATELET
Abs Immature Granulocytes: 0.33 10*3/uL — ABNORMAL HIGH (ref 0.00–0.07)
Basophils Absolute: 0.1 10*3/uL (ref 0.0–0.1)
Basophils Relative: 1 %
Eosinophils Absolute: 0.4 10*3/uL (ref 0.0–0.5)
Eosinophils Relative: 4 %
HCT: 33.4 % — ABNORMAL LOW (ref 36.0–46.0)
Hemoglobin: 11.7 g/dL — ABNORMAL LOW (ref 12.0–15.0)
Immature Granulocytes: 4 %
Lymphocytes Relative: 9 %
Lymphs Abs: 0.9 10*3/uL (ref 0.7–4.0)
MCH: 32.8 pg (ref 26.0–34.0)
MCHC: 35 g/dL (ref 30.0–36.0)
MCV: 93.6 fL (ref 80.0–100.0)
Monocytes Absolute: 1 10*3/uL (ref 0.1–1.0)
Monocytes Relative: 10 %
Neutro Abs: 6.7 10*3/uL (ref 1.7–7.7)
Neutrophils Relative %: 72 %
Platelets: 289 10*3/uL (ref 150–400)
RBC: 3.57 MIL/uL — ABNORMAL LOW (ref 3.87–5.11)
RDW: 14.3 % (ref 11.5–15.5)
WBC: 9.4 10*3/uL (ref 4.0–10.5)
nRBC: 0 % (ref 0.0–0.2)

## 2019-06-04 LAB — COMPREHENSIVE METABOLIC PANEL
ALT: 20 U/L (ref 0–44)
AST: 22 U/L (ref 15–41)
Albumin: 3.2 g/dL — ABNORMAL LOW (ref 3.5–5.0)
Alkaline Phosphatase: 44 U/L (ref 38–126)
Anion gap: 8 (ref 5–15)
BUN: 21 mg/dL — ABNORMAL HIGH (ref 6–20)
CO2: 22 mmol/L (ref 22–32)
Calcium: 8.1 mg/dL — ABNORMAL LOW (ref 8.9–10.3)
Chloride: 105 mmol/L (ref 98–111)
Creatinine, Ser: 0.65 mg/dL (ref 0.44–1.00)
GFR calc Af Amer: >60 mL/min (ref >60)
GFR calc non Af Amer: >60 mL/min (ref >60)
Glucose, Bld: 117 mg/dL — ABNORMAL HIGH (ref 70–99)
Potassium: 3.4 mmol/L — ABNORMAL LOW (ref 3.5–5.1)
Sodium: 135 mmol/L (ref 135–145)
Total Bilirubin: 0.7 mg/dL (ref 0.3–1.2)
Total Protein: 5.6 g/dL — ABNORMAL LOW (ref 6.5–8.1)

## 2019-06-04 MED ORDER — HEPARIN SOD (PORK) LOCK FLUSH 100 UNIT/ML IV SOLN
INTRAVENOUS | Status: AC
  Filled 2019-06-04: qty 5

## 2019-06-04 MED ORDER — SODIUM CHLORIDE 0.9 % IV SOLN
80.0000 mg/m2 | Freq: Once | INTRAVENOUS | Status: AC
  Administered 2019-06-04: 150 mg via INTRAVENOUS
  Filled 2019-06-04: qty 25

## 2019-06-04 MED ORDER — SODIUM CHLORIDE 0.9 % IV SOLN
20.0000 mg | Freq: Once | INTRAVENOUS | Status: AC
  Administered 2019-06-04: 20 mg via INTRAVENOUS
  Filled 2019-06-04: qty 20

## 2019-06-04 MED ORDER — DIPHENHYDRAMINE HCL 50 MG/ML IJ SOLN
50.0000 mg | Freq: Once | INTRAMUSCULAR | Status: AC
  Administered 2019-06-04: 50 mg via INTRAVENOUS
  Filled 2019-06-04: qty 1

## 2019-06-04 MED ORDER — HEPARIN SOD (PORK) LOCK FLUSH 100 UNIT/ML IV SOLN
500.0000 [IU] | Freq: Once | INTRAVENOUS | Status: AC | PRN
  Administered 2019-06-04: 500 [IU]
  Filled 2019-06-04: qty 5

## 2019-06-04 MED ORDER — FAMOTIDINE IN NACL 20-0.9 MG/50ML-% IV SOLN
20.0000 mg | Freq: Once | INTRAVENOUS | Status: AC
  Administered 2019-06-04: 20 mg via INTRAVENOUS
  Filled 2019-06-04: qty 50

## 2019-06-04 MED ORDER — SODIUM CHLORIDE 0.9% FLUSH
10.0000 mL | Freq: Once | INTRAVENOUS | Status: AC
  Administered 2019-06-04: 10 mL via INTRAVENOUS
  Filled 2019-06-04: qty 10

## 2019-06-04 MED ORDER — SODIUM CHLORIDE 0.9 % IV SOLN
Freq: Once | INTRAVENOUS | Status: AC
  Filled 2019-06-04: qty 250

## 2019-06-04 NOTE — Progress Notes (Signed)
Pt questions taking Lasix with having a Sulfa allergy. Dr. Janese Banks aware and will talk with pt at chairside.  Calcium 8.1, Potassium 3.4, Per Dr. Janese Banks okay to proceed with Taxol treatment as scheduled.

## 2019-06-07 ENCOUNTER — Encounter: Payer: Self-pay | Admitting: *Deleted

## 2019-06-10 ENCOUNTER — Other Ambulatory Visit: Payer: Self-pay

## 2019-06-10 DIAGNOSIS — C50212 Malignant neoplasm of upper-inner quadrant of left female breast: Secondary | ICD-10-CM

## 2019-06-10 DIAGNOSIS — Z170 Estrogen receptor positive status [ER+]: Secondary | ICD-10-CM

## 2019-06-11 ENCOUNTER — Inpatient Hospital Stay: Payer: Medicaid Other

## 2019-06-11 ENCOUNTER — Encounter: Payer: Self-pay | Admitting: Oncology

## 2019-06-11 ENCOUNTER — Inpatient Hospital Stay (HOSPITAL_BASED_OUTPATIENT_CLINIC_OR_DEPARTMENT_OTHER): Payer: Medicaid Other | Admitting: Oncology

## 2019-06-11 VITALS — HR 99

## 2019-06-11 VITALS — BP 128/72 | HR 105 | Temp 98.3°F | Resp 16 | Ht 63.0 in | Wt 187.1 lb

## 2019-06-11 DIAGNOSIS — Z5111 Encounter for antineoplastic chemotherapy: Secondary | ICD-10-CM

## 2019-06-11 DIAGNOSIS — Z170 Estrogen receptor positive status [ER+]: Secondary | ICD-10-CM

## 2019-06-11 DIAGNOSIS — C50212 Malignant neoplasm of upper-inner quadrant of left female breast: Secondary | ICD-10-CM

## 2019-06-11 LAB — CBC WITH DIFFERENTIAL/PLATELET
Abs Immature Granulocytes: 0.34 10*3/uL — ABNORMAL HIGH (ref 0.00–0.07)
Basophils Absolute: 0.1 10*3/uL (ref 0.0–0.1)
Basophils Relative: 1 %
Eosinophils Absolute: 0.2 10*3/uL (ref 0.0–0.5)
Eosinophils Relative: 3 %
HCT: 33.3 % — ABNORMAL LOW (ref 36.0–46.0)
Hemoglobin: 11.9 g/dL — ABNORMAL LOW (ref 12.0–15.0)
Immature Granulocytes: 4 %
Lymphocytes Relative: 7 %
Lymphs Abs: 0.7 10*3/uL (ref 0.7–4.0)
MCH: 33.4 pg (ref 26.0–34.0)
MCHC: 35.7 g/dL (ref 30.0–36.0)
MCV: 93.5 fL (ref 80.0–100.0)
Monocytes Absolute: 0.9 10*3/uL (ref 0.1–1.0)
Monocytes Relative: 10 %
Neutro Abs: 7 10*3/uL (ref 1.7–7.7)
Neutrophils Relative %: 75 %
Platelets: 318 10*3/uL (ref 150–400)
RBC: 3.56 MIL/uL — ABNORMAL LOW (ref 3.87–5.11)
RDW: 14.2 % (ref 11.5–15.5)
WBC: 9.2 10*3/uL (ref 4.0–10.5)
nRBC: 0 % (ref 0.0–0.2)

## 2019-06-11 LAB — COMPREHENSIVE METABOLIC PANEL
ALT: 22 U/L (ref 0–44)
AST: 23 U/L (ref 15–41)
Albumin: 3.2 g/dL — ABNORMAL LOW (ref 3.5–5.0)
Alkaline Phosphatase: 49 U/L (ref 38–126)
Anion gap: 9 (ref 5–15)
BUN: 18 mg/dL (ref 6–20)
CO2: 24 mmol/L (ref 22–32)
Calcium: 8.7 mg/dL — ABNORMAL LOW (ref 8.9–10.3)
Chloride: 102 mmol/L (ref 98–111)
Creatinine, Ser: 0.53 mg/dL (ref 0.44–1.00)
GFR calc Af Amer: >60 mL/min (ref >60)
GFR calc non Af Amer: >60 mL/min (ref >60)
Glucose, Bld: 103 mg/dL — ABNORMAL HIGH (ref 70–99)
Potassium: 3.8 mmol/L (ref 3.5–5.1)
Sodium: 135 mmol/L (ref 135–145)
Total Bilirubin: 0.8 mg/dL (ref 0.3–1.2)
Total Protein: 5.9 g/dL — ABNORMAL LOW (ref 6.5–8.1)

## 2019-06-11 MED ORDER — SODIUM CHLORIDE 0.9% FLUSH
10.0000 mL | Freq: Once | INTRAVENOUS | Status: AC
  Administered 2019-06-11: 10 mL via INTRAVENOUS
  Filled 2019-06-11: qty 10

## 2019-06-11 MED ORDER — SODIUM CHLORIDE 0.9 % IV SOLN
80.0000 mg/m2 | Freq: Once | INTRAVENOUS | Status: AC
  Administered 2019-06-11: 150 mg via INTRAVENOUS
  Filled 2019-06-11: qty 25

## 2019-06-11 MED ORDER — HEPARIN SOD (PORK) LOCK FLUSH 100 UNIT/ML IV SOLN
500.0000 [IU] | Freq: Once | INTRAVENOUS | Status: AC
  Administered 2019-06-11: 500 [IU] via INTRAVENOUS
  Filled 2019-06-11: qty 5

## 2019-06-11 MED ORDER — FAMOTIDINE IN NACL 20-0.9 MG/50ML-% IV SOLN
20.0000 mg | Freq: Once | INTRAVENOUS | Status: AC
  Administered 2019-06-11: 20 mg via INTRAVENOUS
  Filled 2019-06-11: qty 50

## 2019-06-11 MED ORDER — SODIUM CHLORIDE 0.9 % IV SOLN
Freq: Once | INTRAVENOUS | Status: AC
  Filled 2019-06-11: qty 250

## 2019-06-11 MED ORDER — HEPARIN SOD (PORK) LOCK FLUSH 100 UNIT/ML IV SOLN
INTRAVENOUS | Status: AC
  Filled 2019-06-11: qty 5

## 2019-06-11 MED ORDER — SODIUM CHLORIDE 0.9 % IV SOLN
20.0000 mg | Freq: Once | INTRAVENOUS | Status: AC
  Administered 2019-06-11: 20 mg via INTRAVENOUS
  Filled 2019-06-11: qty 20

## 2019-06-11 MED ORDER — DIPHENHYDRAMINE HCL 50 MG/ML IJ SOLN
50.0000 mg | Freq: Once | INTRAMUSCULAR | Status: AC
  Administered 2019-06-11: 11:00:00 50 mg via INTRAVENOUS
  Filled 2019-06-11: qty 1

## 2019-06-11 NOTE — Progress Notes (Signed)
Allergies are bothering her a lot. Right ear stuffy.

## 2019-06-11 NOTE — Progress Notes (Signed)
Pharmacist Chemotherapy Monitoring - Follow Up Assessment    I verify that I have reviewed each item in the below checklist:  . Regimen for the patient is scheduled for the appropriate day and plan matches scheduled date. Marland Kitchen Appropriate non-routine labs are ordered dependent on drug ordered. . If applicable, additional medications reviewed and ordered per protocol based on lifetime cumulative doses and/or treatment regimen.   Plan for follow-up and/or issues identified: No . I-vent associated with next due treatment: No . MD and/or nursing notified: No  Serayah Yazdani K 06/11/2019 11:48 AM

## 2019-06-12 ENCOUNTER — Ambulatory Visit: Payer: Medicaid Other | Admitting: Internal Medicine

## 2019-06-13 NOTE — Progress Notes (Signed)
Hematology/Oncology Consult note [facility]  [phone] [phone]  Patient Care Team: McLean-Scocuzza, Nino Glow, MD as PCP - General (Internal Medicine) Rico Junker, RN as Registered Nurse   Name of the patient: Leah [name]  [id number]  February 12, 1966   Date of visit: 06/13/19  Diagnosis- pathological prognostic stage Ia invasive mammary carcinomapT1b pN1 cM0 ER/PR positive HER-2/neu negative s/p lumpectomy   Chief complaint/ Reason for visit-on treatment assessment prior to cycle 5 of weekly Taxol chemotherapy  Heme/Onc history: Patient is a 54 year old postmenopausal female G2 P2 L2 who recently underwent screening bilateral mammogram which showed area of concern in her left breast. This was followed by an ultrasound and Core biopsy. Ultrasound showed 1 x 1.1 x 0.7 cm hypoechoic mass at the 10:30 position of the left breast. Left axilla appeared normal. 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  Final pathology showed 9 mm grade 1 invasive mammary carcinoma1 out of 2 lymph nodes involved with macro metastatic carcinoma 3.5 mm. Anterior margin was focally positive for which patientunderwent reexcision surgery with negative marginsPT1BPN1a (sn)  MammaPrint score came back as high risk with a risk of recurrence of 29% at 10 years without chemotherapy  Interval history-reports symptoms of abdominal bloating and hand and leg swelling were better after she started taking Decadron for a few days post chemotherapy.  Does not have any significant tingling numbness in her hands and feet.  Reports ongoing fatigue.  She reports some pain in her right ear and has symptoms of nasal congestion.  Denies any fever  ECOG PS- 1 Pain scale- 0   Review of systems- Review of Systems  Constitutional: Negative for chills, fever, malaise/fatigue and weight loss.  HENT:  Negative for congestion, ear discharge and nosebleeds.        Right ear pain  Eyes: Negative for blurred vision.  Respiratory: Negative for cough, hemoptysis, sputum production, shortness of breath and wheezing.   Cardiovascular: Negative for chest pain, palpitations, orthopnea and claudication.  Gastrointestinal: Negative for abdominal pain, blood in stool, constipation, diarrhea, heartburn, melena, nausea and vomiting.  Genitourinary: Negative for dysuria, flank pain, frequency, hematuria and urgency.  Musculoskeletal: Negative for back pain, joint pain and myalgias.  Skin: Negative for rash.  Neurological: Negative for dizziness, tingling, focal weakness, seizures, weakness and headaches.  Endo/Heme/Allergies: Does not bruise/bleed easily.  Psychiatric/Behavioral: Negative for depression and suicidal ideas. The patient does not have insomnia.        Allergies  Allergen Reactions  . Hydrocodone-Acetaminophen Hives    All over her body.  . Lactose Intolerance (Gi) Other (See Comments)    Bloating and GI upset  . Penicillins Rash    Did it involve swelling of the face/tongue/throat, SOB, or low BP? Unknown Did it involve sudden or severe rash/hives, skin peeling, or any reaction on the inside of your mouth or nose? Yes Did you need to seek medical attention at a hospital or doctor's office? Unknown When did it last happen? Childhood reaction. If all above answers are "NO", may proceed with cephalosporin use.   . Sulfa Antibiotics Rash    Full body rash      Past Medical History:  Diagnosis Date  . Anxiety   . Asthma    as a child  . Breast cancer ([place])   . Carpal tunnel syndrome    R hand   . Colon polyps   . Depression   .  Eosinophilic esophagitis    noted in [place] with GI path report 09/29/14   . Esophageal stricture    s/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 Edwin Cap   . Family history of breast cancer   . Family history of prostate  cancer   . Herniated disc, cervical    s/p epidural injection  . History of kidney stones   . Hyperlipidemia   . Low back pain   . Migraines      Past Surgical History:  Procedure Laterality Date  . BREAST BIOPSY Left 12/25/2018   [place] BX, PATH PENDING  . BREAST LUMPECTOMY WITH NEEDLE LOCALIZATION AND AXILLARY SENTINEL LYMPH NODE BX Left 01/16/2019   Procedure: LEFT BREAST LUMPECTOMY WITH NEEDLE LOCALIZATION AND SENTINEL LYMPH NODE BIOPSY;  Surgeon: Jovita Kussmaul, MD;  Location: [facility] ORS;  Service: General;  Laterality: Left;  . CESAREAN SECTION    . COLONOSCOPY WITH PROPOFOL N/A 03/27/2017   Procedure: COLONOSCOPY WITH PROPOFOL;  Surgeon: Jonathon Bellows, MD;  Location: [facility] ENDOSCOPY;  Service: Gastroenterology;  Laterality: N/A;  . ESOPHAGOGASTRODUODENOSCOPY    . ESOPHAGOGASTRODUODENOSCOPY (EGD) WITH PROPOFOL N/A 03/27/2017   Procedure: ESOPHAGOGASTRODUODENOSCOPY (EGD) WITH PROPOFOL WITH DILATION;  Surgeon: Jonathon Bellows, MD;  Location: [facility] ENDOSCOPY;  Service: Gastroenterology;  Laterality: N/A;  . PORTACATH PLACEMENT N/A 02/15/2019   Procedure: INSERTION PORT-A-CATH WITH POSSIBLE ULTRASOUND;  Surgeon: Jovita Kussmaul, MD;  Location: [facility] ORS;  Service: General;  Laterality: N/A;  . RE-EXCISION OF BREAST CANCER,SUPERIOR MARGINS Left 02/15/2019   Procedure: RE-EXCISION OF LEFT BREAST CANCER ANTERIOR MARGINS;  Surgeon: Jovita Kussmaul, MD;  Location: [facility] ORS;  Service: General;  Laterality: Left;  . THROAT SURGERY  2015  . UMBILICAL HERNIA REPAIR  2006    x 2   w mesh per pt    Social History   Socioeconomic History  . Marital status: Significant Other    Spouse name: Not on file  . Number of children: Not on file  . Years of education: Not on file  . Highest education level: Not on file  Occupational History  . Not on file  Tobacco Use  . Smoking status: Former Smoker    Packs/day: 1.00    Years: 20.00    Pack years: 20.00    Quit date: 06/02/2006    Years since quitting: 13.0    . Smokeless tobacco: Never Used  Substance and Sexual Activity  . Alcohol use: Yes    Alcohol/week: 2.0 - 3.0 standard drinks    Types: 2 - 3 Standard drinks or equivalent per week    Comment: socially  . Drug use: No  . Sexual activity: Yes    Partners: Male  Other Topics Concern  . 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 As of 02/26/17    Divorced.    Former smoker 20+ years quit in 1997 then started again for 5 years and quit 10-12 years ago as of 03/08/17    Social Determinants of Health   Financial Resource Strain:   . Difficulty of Paying Living Expenses:   Food Insecurity:   . Worried About Charity fundraiser in the Last Year:   . Arboriculturist in the Last Year:   Transportation Needs:   . Film/video editor (Medical):   Marland Kitchen  Lack of Transportation (Non-Medical):   Physical Activity:   . Days of Exercise per Week:   . Minutes of Exercise per Session:   Stress:   . Feeling of Stress :   Social Connections:   . Frequency of Communication with Friends and Family:   . Frequency of Social Gatherings with Friends and Family:   . Attends Religious Services:   . Active Member of Clubs or Organizations:   . Attends Archivist Meetings:   Marland Kitchen Marital Status:   Intimate Partner Violence:   . Fear of Current or Ex-Partner:   . Emotionally Abused:   Marland Kitchen Physically Abused:   . Sexually Abused:     Family History  Problem Relation Age of Onset  . Breast cancer Mother        dx late 58s  . Thyroid disease Mother   . Colon polyps Mother   . Prostate cancer Father        dx 104  . Breast cancer Maternal Grandmother        dx late 66s  . Cancer Maternal Uncle        possibly, unsure type     Current Outpatient Medications:  .  acetaminophen (TYLENOL) 500 MG tablet, Take 500-1,000 mg by mouth every 6 (six) hours as needed (for  pain.)., Disp: , Rfl:  .  Cholecalciferol (VITAMIN D-3) 125 MCG (5000 UT) TABS, Take 5,000 Units by mouth daily., Disp: , Rfl:  .  Cyanocobalamin (VITAMIN B-12 PO), Take 1 tablet by mouth daily., Disp: , Rfl:  .  fluticasone (FLONASE) 50 MCG/ACT nasal spray, Place 2 sprays into both nostrils daily. Max 2 sprays, Disp: 16 g, Rfl: 12 .  furosemide (LASIX) 20 MG tablet, Take 1 tablet (20 mg total) by mouth as needed., Disp: 20 tablet, Rfl: 0 .  gabapentin (NEURONTIN) 300 MG capsule, Take 300 mg by mouth at bedtime., Disp: , Rfl:  .  lidocaine-prilocaine (EMLA) cream, Apply to affected area once, Disp: 30 g, Rfl: 3 .  meloxicam (MOBIC) 15 MG tablet, Take 15 mg by mouth daily., Disp: , Rfl:  .  methocarbamol (ROBAXIN) 750 MG tablet, Take 1 tablet (750 mg total) by mouth every 6 (six) hours as needed for muscle spasms., Disp: 120 tablet, Rfl: 5 .  montelukast (SINGULAIR) 10 MG tablet, Take 1 tablet (10 mg total) by mouth at bedtime., Disp: 90 tablet, Rfl: 3 .  OLANZapine (ZYPREXA) 10 MG tablet, TAKE 1 TABLET BY MOUTH EVERYDAY AT BEDTIME, Disp: 90 tablet, Rfl: 1 .  omeprazole (PRILOSEC) 40 MG capsule, Take 40 mg by mouth every evening. , Disp: , Rfl:  .  tiZANidine (ZANAFLEX) 4 MG tablet, Take 4 mg by mouth at bedtime. , Disp: , Rfl:  .  traZODone (DESYREL) 50 MG tablet, Take 1-1.5 tablets (50-75 mg total) by mouth at bedtime as needed for sleep. (Patient taking differently: Take 50 mg by mouth at bedtime as needed for sleep. ), Disp: 135 tablet, Rfl: 4 .  meclizine (ANTIVERT) 12.5 MG tablet, Take 1 tablet (12.5 mg total) by mouth 3 (three) times daily as needed for dizziness. (Patient not taking: Reported on 06/11/2019), Disp: 90 tablet, Rfl: 1 .  traMADol (ULTRAM) 50 MG tablet, Take 1-2 tablets (50-100 mg total) by mouth every 6 (six) hours as needed. (Patient not taking: Reported on 06/11/2019), Disp: 20 tablet, Rfl: 0  Physical exam:  Vitals:   06/11/19 0925  BP: 128/72  Pulse: (!) 105  Resp: 16  Temp: 98.3 F (36.8 C)  TempSrc: Tympanic  Weight: 187 lb 1.6 oz (84.9 kg)  Height: _0  (1.6 m)   Physical Exam HENT:     Head: Normocephalic and atraumatic.     Ears:     Comments: Examination of bilateral ears reveals congestion in her right middle ear. Eyes:     Pupils: Pupils are equal, round, and reactive to light.  Cardiovascular:     Rate and Rhythm: Normal rate and regular rhythm.     Heart sounds: Normal heart sounds.  Pulmonary:     Effort: Pulmonary effort is normal.     Breath sounds: Normal breath sounds.  Abdominal:     General: Bowel sounds are normal.     Palpations: Abdomen is soft.  Musculoskeletal:     Cervical back: Normal range of motion.  Skin:    General: Skin is warm and dry.  Neurological:     Mental Status: She is alert and oriented to person, place, and time.      CMP Latest Ref Rng & Units 06/11/2019  Glucose 70 - 99 mg/dL 103(H)  BUN 6 - 20 mg/dL 18  Creatinine 0.44 - 1.00 mg/dL 0.53  Sodium 135 - 145 mmol/L 135  Potassium 3.5 - 5.1 mmol/L 3.8  Chloride 98 - 111 mmol/L 102  CO2 22 - 32 mmol/L 24  Calcium 8.9 - 10.3 mg/dL 8.7(L)  Total Protein 6.5 - 8.1 g/dL 5.9(L)  Total Bilirubin 0.3 - 1.2 mg/dL 0.8  Alkaline Phos 38 - 126 U/L 49  AST 15 - 41 U/L 23  ALT 0 - 44 U/L 22   CBC Latest Ref Rng & Units 06/11/2019  WBC 4.0 - 10.5 K/uL 9.2  Hemoglobin 12.0 - 15.0 g/dL 11.9(L)  Hematocrit 36.0 - 46.0 % 33.3(L)  Platelets 150 - 400 K/uL 318      Assessment and plan- Patient is a 54 y.o. female with prior history of polycythemia now with newly diagnosed invasive mammary carcinoma pathologic prognostic stage Ia of the left breast pT1b pN1 acM0 ER/PR positive HER-2/neu negative. She is s/p lumpectomywith high risk MammaPrint score.She is s/p 4 cycles of dose dense AC chemotherapy.  She is here for on treatment assessment prior to cycle 5 of weekly Taxol chemotherapy  Counts okay to proceed with cycle 5 of weekly Taxol chemotherapy today.   She will directly proceed for cycle 6 next week and I will see her back in 2 weeks for cycle 7.  Chemo-induced anemia: Currently stable continue to monitor  Symptoms of abdominal bloating and upper extremity edema improved after Decadron.  She could not take Lasix because of sulfa allergy.  Continue to monitor  Ear congestion: I suspect this is seasonal secondary to allergic etiology.  I do not notice any overt infection suggestive of otitis media.  I will therefore hold off on prescribing antibiotics.   Visit Diagnosis 1. Encounter for antineoplastic chemotherapy   2. Malignant neoplasm of upper-inner quadrant of left breast in female, estrogen receptor positive ([place])      Dr. Randa Evens, MD, MPH [facility] [phone] 06/13/2019 5:01 PM

## 2019-06-18 ENCOUNTER — Other Ambulatory Visit: Payer: Self-pay

## 2019-06-18 ENCOUNTER — Inpatient Hospital Stay: Payer: Medicaid Other

## 2019-06-18 ENCOUNTER — Other Ambulatory Visit: Payer: Self-pay | Admitting: Oncology

## 2019-06-18 ENCOUNTER — Inpatient Hospital Stay: Payer: Medicaid Other | Attending: Oncology

## 2019-06-18 VITALS — BP 117/70 | HR 88 | Temp 98.0°F | Resp 18 | Wt 189.0 lb

## 2019-06-18 DIAGNOSIS — Z5111 Encounter for antineoplastic chemotherapy: Secondary | ICD-10-CM | POA: Diagnosis not present

## 2019-06-18 DIAGNOSIS — Z95828 Presence of other vascular implants and grafts: Secondary | ICD-10-CM

## 2019-06-18 DIAGNOSIS — T451X5A Adverse effect of antineoplastic and immunosuppressive drugs, initial encounter: Secondary | ICD-10-CM | POA: Diagnosis not present

## 2019-06-18 DIAGNOSIS — Z170 Estrogen receptor positive status [ER+]: Secondary | ICD-10-CM

## 2019-06-18 DIAGNOSIS — Z87891 Personal history of nicotine dependence: Secondary | ICD-10-CM | POA: Insufficient documentation

## 2019-06-18 DIAGNOSIS — G620 Drug-induced polyneuropathy: Secondary | ICD-10-CM | POA: Insufficient documentation

## 2019-06-18 DIAGNOSIS — R530 Neoplastic (malignant) related fatigue: Secondary | ICD-10-CM | POA: Diagnosis not present

## 2019-06-18 DIAGNOSIS — C50212 Malignant neoplasm of upper-inner quadrant of left female breast: Secondary | ICD-10-CM

## 2019-06-18 DIAGNOSIS — D6481 Anemia due to antineoplastic chemotherapy: Secondary | ICD-10-CM | POA: Diagnosis not present

## 2019-06-18 LAB — CBC WITH DIFFERENTIAL/PLATELET
Abs Immature Granulocytes: 0.45 10*3/uL — ABNORMAL HIGH (ref 0.00–0.07)
Basophils Absolute: 0.1 10*3/uL (ref 0.0–0.1)
Basophils Relative: 1 %
Eosinophils Absolute: 0.3 10*3/uL (ref 0.0–0.5)
Eosinophils Relative: 4 %
HCT: 34.9 % — ABNORMAL LOW (ref 36.0–46.0)
Hemoglobin: 12 g/dL (ref 12.0–15.0)
Immature Granulocytes: 5 %
Lymphocytes Relative: 15 %
Lymphs Abs: 1.2 10*3/uL (ref 0.7–4.0)
MCH: 32.9 pg (ref 26.0–34.0)
MCHC: 34.4 g/dL (ref 30.0–36.0)
MCV: 95.6 fL (ref 80.0–100.0)
Monocytes Absolute: 0.9 10*3/uL (ref 0.1–1.0)
Monocytes Relative: 11 %
Neutro Abs: 5.4 10*3/uL (ref 1.7–7.7)
Neutrophils Relative %: 64 %
Platelets: 278 10*3/uL (ref 150–400)
RBC: 3.65 MIL/uL — ABNORMAL LOW (ref 3.87–5.11)
RDW: 14.5 % (ref 11.5–15.5)
WBC: 8.4 10*3/uL (ref 4.0–10.5)
nRBC: 0 % (ref 0.0–0.2)

## 2019-06-18 LAB — COMPREHENSIVE METABOLIC PANEL
ALT: 26 U/L (ref 0–44)
AST: 16 U/L (ref 15–41)
Albumin: 3.3 g/dL — ABNORMAL LOW (ref 3.5–5.0)
Alkaline Phosphatase: 45 U/L (ref 38–126)
Anion gap: 9 (ref 5–15)
BUN: 31 mg/dL — ABNORMAL HIGH (ref 6–20)
CO2: 25 mmol/L (ref 22–32)
Calcium: 8.8 mg/dL — ABNORMAL LOW (ref 8.9–10.3)
Chloride: 104 mmol/L (ref 98–111)
Creatinine, Ser: 0.64 mg/dL (ref 0.44–1.00)
GFR calc Af Amer: >60 mL/min (ref >60)
GFR calc non Af Amer: >60 mL/min (ref >60)
Glucose, Bld: 95 mg/dL (ref 70–99)
Potassium: 3.8 mmol/L (ref 3.5–5.1)
Sodium: 138 mmol/L (ref 135–145)
Total Bilirubin: 0.9 mg/dL (ref 0.3–1.2)
Total Protein: 6.3 g/dL — ABNORMAL LOW (ref 6.5–8.1)

## 2019-06-18 MED ORDER — SODIUM CHLORIDE 0.9 % IV SOLN
65.0000 mg/m2 | Freq: Once | INTRAVENOUS | Status: AC
  Administered 2019-06-18: 126 mg via INTRAVENOUS
  Filled 2019-06-18: qty 21

## 2019-06-18 MED ORDER — SODIUM CHLORIDE 0.9 % IV SOLN
20.0000 mg | Freq: Once | INTRAVENOUS | Status: AC
  Administered 2019-06-18: 20 mg via INTRAVENOUS
  Filled 2019-06-18: qty 20

## 2019-06-18 MED ORDER — SODIUM CHLORIDE 0.9 % IV SOLN
Freq: Once | INTRAVENOUS | Status: AC
  Filled 2019-06-18: qty 250

## 2019-06-18 MED ORDER — SODIUM CHLORIDE 0.9% FLUSH
10.0000 mL | Freq: Once | INTRAVENOUS | Status: AC
  Administered 2019-06-18: 10 mL via INTRAVENOUS
  Filled 2019-06-18: qty 10

## 2019-06-18 MED ORDER — DIPHENHYDRAMINE HCL 50 MG/ML IJ SOLN
50.0000 mg | Freq: Once | INTRAMUSCULAR | Status: AC
  Administered 2019-06-18: 50 mg via INTRAVENOUS
  Filled 2019-06-18: qty 1

## 2019-06-18 MED ORDER — FAMOTIDINE IN NACL 20-0.9 MG/50ML-% IV SOLN
20.0000 mg | Freq: Once | INTRAVENOUS | Status: AC
  Administered 2019-06-18: 20 mg via INTRAVENOUS
  Filled 2019-06-18: qty 50

## 2019-06-18 MED ORDER — HEPARIN SOD (PORK) LOCK FLUSH 100 UNIT/ML IV SOLN
500.0000 [IU] | Freq: Once | INTRAVENOUS | Status: AC | PRN
  Administered 2019-06-18: 500 [IU]
  Filled 2019-06-18: qty 5

## 2019-06-18 NOTE — Progress Notes (Signed)
Pharmacist Chemotherapy Monitoring - Follow Up Assessment    I verify that I have reviewed each item in the below checklist:  . Regimen for the patient is scheduled for the appropriate day and plan matches scheduled date. Marland Kitchen Appropriate non-routine labs are ordered dependent on drug ordered. . If applicable, additional medications reviewed and ordered per protocol based on lifetime cumulative doses and/or treatment regimen.   Plan for follow-up and/or issues identified: No . I-vent associated with next due treatment: No . MD and/or nursing notified: No  Leah [name] K 06/18/2019 9:05 AM

## 2019-06-24 ENCOUNTER — Encounter: Payer: Self-pay | Admitting: Oncology

## 2019-06-24 NOTE — Progress Notes (Signed)
Patient is coming in for follow up she mentions night sweats enough to soak her clothes just started last week.

## 2019-06-25 ENCOUNTER — Inpatient Hospital Stay (HOSPITAL_BASED_OUTPATIENT_CLINIC_OR_DEPARTMENT_OTHER): Payer: Medicaid Other | Admitting: Oncology

## 2019-06-25 ENCOUNTER — Encounter: Payer: Self-pay | Admitting: *Deleted

## 2019-06-25 ENCOUNTER — Other Ambulatory Visit: Payer: Self-pay

## 2019-06-25 ENCOUNTER — Inpatient Hospital Stay: Payer: Medicaid Other

## 2019-06-25 ENCOUNTER — Encounter: Payer: Self-pay | Admitting: Oncology

## 2019-06-25 VITALS — BP 122/73 | HR 89 | Resp 18

## 2019-06-25 VITALS — BP 122/70 | HR 84 | Temp 96.7°F | Ht 63.0 in | Wt 188.8 lb

## 2019-06-25 DIAGNOSIS — C50212 Malignant neoplasm of upper-inner quadrant of left female breast: Secondary | ICD-10-CM

## 2019-06-25 DIAGNOSIS — T451X5A Adverse effect of antineoplastic and immunosuppressive drugs, initial encounter: Secondary | ICD-10-CM

## 2019-06-25 DIAGNOSIS — Z170 Estrogen receptor positive status [ER+]: Secondary | ICD-10-CM | POA: Diagnosis not present

## 2019-06-25 DIAGNOSIS — R5383 Other fatigue: Secondary | ICD-10-CM

## 2019-06-25 DIAGNOSIS — Z5111 Encounter for antineoplastic chemotherapy: Secondary | ICD-10-CM

## 2019-06-25 DIAGNOSIS — G620 Drug-induced polyneuropathy: Secondary | ICD-10-CM | POA: Diagnosis not present

## 2019-06-25 DIAGNOSIS — Z95828 Presence of other vascular implants and grafts: Secondary | ICD-10-CM

## 2019-06-25 LAB — CBC WITH DIFFERENTIAL/PLATELET
Abs Immature Granulocytes: 0.24 10*3/uL — ABNORMAL HIGH (ref 0.00–0.07)
Basophils Absolute: 0.1 10*3/uL (ref 0.0–0.1)
Basophils Relative: 1 %
Eosinophils Absolute: 0.2 10*3/uL (ref 0.0–0.5)
Eosinophils Relative: 2 %
HCT: 32.8 % — ABNORMAL LOW (ref 36.0–46.0)
Hemoglobin: 11.6 g/dL — ABNORMAL LOW (ref 12.0–15.0)
Immature Granulocytes: 4 %
Lymphocytes Relative: 10 %
Lymphs Abs: 0.6 10*3/uL — ABNORMAL LOW (ref 0.7–4.0)
MCH: 33.5 pg (ref 26.0–34.0)
MCHC: 35.4 g/dL (ref 30.0–36.0)
MCV: 94.8 fL (ref 80.0–100.0)
Monocytes Absolute: 0.8 10*3/uL (ref 0.1–1.0)
Monocytes Relative: 12 %
Neutro Abs: 4.7 10*3/uL (ref 1.7–7.7)
Neutrophils Relative %: 71 %
Platelets: 251 10*3/uL (ref 150–400)
RBC: 3.46 MIL/uL — ABNORMAL LOW (ref 3.87–5.11)
RDW: 14.5 % (ref 11.5–15.5)
WBC: 6.6 10*3/uL (ref 4.0–10.5)
nRBC: 0 % (ref 0.0–0.2)

## 2019-06-25 LAB — COMPREHENSIVE METABOLIC PANEL
ALT: 27 U/L (ref 0–44)
AST: 23 U/L (ref 15–41)
Albumin: 3.2 g/dL — ABNORMAL LOW (ref 3.5–5.0)
Alkaline Phosphatase: 50 U/L (ref 38–126)
Anion gap: 10 (ref 5–15)
BUN: 17 mg/dL (ref 6–20)
CO2: 25 mmol/L (ref 22–32)
Calcium: 8.4 mg/dL — ABNORMAL LOW (ref 8.9–10.3)
Chloride: 102 mmol/L (ref 98–111)
Creatinine, Ser: 0.74 mg/dL (ref 0.44–1.00)
GFR calc Af Amer: >60 mL/min (ref >60)
GFR calc non Af Amer: >60 mL/min (ref >60)
Glucose, Bld: 129 mg/dL — ABNORMAL HIGH (ref 70–99)
Potassium: 3.6 mmol/L (ref 3.5–5.1)
Sodium: 137 mmol/L (ref 135–145)
Total Bilirubin: 0.6 mg/dL (ref 0.3–1.2)
Total Protein: 5.9 g/dL — ABNORMAL LOW (ref 6.5–8.1)

## 2019-06-25 MED ORDER — SODIUM CHLORIDE 0.9 % IV SOLN
20.0000 mg | Freq: Once | INTRAVENOUS | Status: AC
  Administered 2019-06-25: 20 mg via INTRAVENOUS
  Filled 2019-06-25: qty 20

## 2019-06-25 MED ORDER — FAMOTIDINE IN NACL 20-0.9 MG/50ML-% IV SOLN
20.0000 mg | Freq: Once | INTRAVENOUS | Status: AC
  Administered 2019-06-25: 11:00:00 20 mg via INTRAVENOUS
  Filled 2019-06-25: qty 50

## 2019-06-25 MED ORDER — SODIUM CHLORIDE 0.9 % IV SOLN
Freq: Once | INTRAVENOUS | Status: AC
  Filled 2019-06-25: qty 250

## 2019-06-25 MED ORDER — HEPARIN SOD (PORK) LOCK FLUSH 100 UNIT/ML IV SOLN
INTRAVENOUS | Status: AC
  Filled 2019-06-25: qty 5

## 2019-06-25 MED ORDER — SODIUM CHLORIDE 0.9% FLUSH
10.0000 mL | Freq: Once | INTRAVENOUS | Status: AC
  Administered 2019-06-25: 10 mL via INTRAVENOUS
  Filled 2019-06-25: qty 10

## 2019-06-25 MED ORDER — DIPHENHYDRAMINE HCL 50 MG/ML IJ SOLN
50.0000 mg | Freq: Once | INTRAMUSCULAR | Status: AC
  Administered 2019-06-25: 50 mg via INTRAVENOUS
  Filled 2019-06-25: qty 1

## 2019-06-25 MED ORDER — SODIUM CHLORIDE 0.9 % IV SOLN
65.0000 mg/m2 | Freq: Once | INTRAVENOUS | Status: AC
  Administered 2019-06-25: 126 mg via INTRAVENOUS
  Filled 2019-06-25: qty 21

## 2019-06-25 MED ORDER — HEPARIN SOD (PORK) LOCK FLUSH 100 UNIT/ML IV SOLN
500.0000 [IU] | Freq: Once | INTRAVENOUS | Status: AC | PRN
  Administered 2019-06-25: 500 [IU]
  Filled 2019-06-25: qty 5

## 2019-06-26 ENCOUNTER — Encounter: Payer: Self-pay | Admitting: *Deleted

## 2019-06-26 ENCOUNTER — Telehealth: Payer: Self-pay | Admitting: *Deleted

## 2019-06-26 ENCOUNTER — Other Ambulatory Visit: Payer: Self-pay | Admitting: *Deleted

## 2019-06-26 ENCOUNTER — Encounter: Payer: Self-pay | Admitting: Oncology

## 2019-06-26 MED ORDER — DULOXETINE HCL 30 MG PO CPEP: 30.0000 mg | ORAL_CAPSULE | Freq: Every day | ORAL | 0 refills | Status: DC

## 2019-06-26 NOTE — Telephone Encounter (Signed)
I called patient to ask her about the Cymbalta and how often she uses tramadol.  The tramadol has an interaction with the Cymbalta.  Patient states that she hardly ever takes tramadol.  She last took it on last Sunday.  Before that time she thinks it has been 2 to 3 months since she has had it before.  Check with Dr. Mayer Camel as long as the patient can hold off on the tramadol we will send in Cymbalta.  If patient has pain she should be taking Tylenol instead.  3 able to start the Cymbalta and knows to take 30 mg once a day for a week and then take 2 tablets which will be 60 mg every day for the rest of the prescription.  I also told patient that she can call in a few days before her prescription was running out and if the drug is helping her we will refill it was 60 mg in the future for refills.  Patient has also chosen [facility] to do acupuncture.  I have faxed over the request for the office to call her with an appointment date.  Patient was told of the anal call in the next 2 to 3 days and give her an appointment to let us know and will follow back up about the consult.  Patient agreeable to this plan

## 2019-06-27 NOTE — Progress Notes (Signed)
Hematology/Oncology Consult note [facility]  Telephone:([phone] [phone]  Patient Care Team: McLean-Scocuzza, Nino Glow, MD as PCP - General (Internal Medicine) Rico Junker, RN as Registered Nurse   Name of the patient: Leah [name]  [id number]  1965/05/03   Date of visit: 06/27/19  Diagnosis- pathological prognostic stage Ia invasive mammary carcinomapT1b pN1 cM0 ER/PR positive HER-2/neu negative s/p lumpectomy  Chief complaint/ Reason for visit-on treatment assessment prior to cycle 7 of weekly Taxol chemotherapy  Heme/Onc history: Patient is a 54 year old postmenopausal female G2 P2 L2 who recently underwent screening bilateral mammogram which showed area of concern in her left breast. This was followed by an ultrasound and Core biopsy. Ultrasound showed 1 x 1.1 x 0.7 cm hypoechoic mass at the 10:30 position of the left breast. Left axilla appeared normal. 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  Final pathology showed 9 mm grade 1 invasive mammary carcinoma1 out of 2 lymph nodes involved with macro metastatic carcinoma 3.5 mm. Anterior margin was focally positive for which patientunderwent reexcision surgery with negative marginsPT1BPN1a (sn)  MammaPrint score came back as high risk with a risk of recurrence of 29% at 10 years without chemotherapy   Interval history-reports tingling numbness in her hands and feet is worse as compared to the last week.  It is now persistent.  Reports ongoing fatigue due to chemotherapy.  Symptoms of bloating are better after she has taken 2 days of Decadron post chemo.  ECOG PS- 1 Pain scale- 0   Review of systems- Review of Systems  Constitutional: Positive for malaise/fatigue. Negative for chills, fever and weight loss.  HENT: Negative for congestion, ear discharge and nosebleeds.   Eyes: Negative for blurred  vision.  Respiratory: Negative for cough, hemoptysis, sputum production, shortness of breath and wheezing.   Cardiovascular: Negative for chest pain, palpitations, orthopnea and claudication.  Gastrointestinal: Negative for abdominal pain, blood in stool, constipation, diarrhea, heartburn, melena, nausea and vomiting.  Genitourinary: Negative for dysuria, flank pain, frequency, hematuria and urgency.  Musculoskeletal: Negative for back pain, joint pain and myalgias.  Skin: Negative for rash.  Neurological: Positive for sensory change (peripheral neuropathy). Negative for dizziness, tingling, focal weakness, seizures, weakness and headaches.  Endo/Heme/Allergies: Does not bruise/bleed easily.  Psychiatric/Behavioral: Negative for depression and suicidal ideas. The patient does not have insomnia.        Allergies  Allergen Reactions  . Hydrocodone-Acetaminophen Hives    All over her body.  . Lactose Intolerance (Gi) Other (See Comments)    Bloating and GI upset  . Penicillins Rash    Did it involve swelling of the face/tongue/throat, SOB, or low BP? Unknown Did it involve sudden or severe rash/hives, skin peeling, or any reaction on the inside of your mouth or nose? Yes Did you need to seek medical attention at a hospital or doctor's office? Unknown When did it last happen? Childhood reaction. If all above answers are "NO", may proceed with cephalosporin use.   . Sulfa Antibiotics Rash    Full body rash      Past Medical History:  Diagnosis Date  . Anxiety   . Asthma    as a child  . Breast cancer ([place])   . Carpal tunnel syndrome    R hand   . Colon polyps   . Depression   . Eosinophilic esophagitis    noted in [place] with GI path report 09/29/14   .  Esophageal stricture    s/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 Edwin Cap   . Family history of breast cancer   . Family history of prostate cancer   . Herniated disc, cervical    s/p epidural  injection  . History of kidney stones   . Hyperlipidemia   . Low back pain   . Migraines      Past Surgical History:  Procedure Laterality Date  . BREAST BIOPSY Left 12/25/2018   [place] BX, PATH PENDING  . BREAST LUMPECTOMY WITH NEEDLE LOCALIZATION AND AXILLARY SENTINEL LYMPH NODE BX Left 01/16/2019   Procedure: LEFT BREAST LUMPECTOMY WITH NEEDLE LOCALIZATION AND SENTINEL LYMPH NODE BIOPSY;  Surgeon: Jovita Kussmaul, MD;  Location: [facility] ORS;  Service: General;  Laterality: Left;  . CESAREAN SECTION    . COLONOSCOPY WITH PROPOFOL N/A 03/27/2017   Procedure: COLONOSCOPY WITH PROPOFOL;  Surgeon: Jonathon Bellows, MD;  Location: [facility] ENDOSCOPY;  Service: Gastroenterology;  Laterality: N/A;  . ESOPHAGOGASTRODUODENOSCOPY    . ESOPHAGOGASTRODUODENOSCOPY (EGD) WITH PROPOFOL N/A 03/27/2017   Procedure: ESOPHAGOGASTRODUODENOSCOPY (EGD) WITH PROPOFOL WITH DILATION;  Surgeon: Jonathon Bellows, MD;  Location: [facility] ENDOSCOPY;  Service: Gastroenterology;  Laterality: N/A;  . PORTACATH PLACEMENT N/A 02/15/2019   Procedure: INSERTION PORT-A-CATH WITH POSSIBLE ULTRASOUND;  Surgeon: Jovita Kussmaul, MD;  Location: [facility] ORS;  Service: General;  Laterality: N/A;  . RE-EXCISION OF BREAST CANCER,SUPERIOR MARGINS Left 02/15/2019   Procedure: RE-EXCISION OF LEFT BREAST CANCER ANTERIOR MARGINS;  Surgeon: Jovita Kussmaul, MD;  Location: [facility] ORS;  Service: General;  Laterality: Left;  . THROAT SURGERY  2015  . UMBILICAL HERNIA REPAIR  2006    x 2   w mesh per pt    Social History   Socioeconomic History  . Marital status: Significant Other    Spouse name: Not on file  . Number of children: Not on file  . Years of education: Not on file  . Highest education level: Not on file  Occupational History  . Not on file  Tobacco Use  . Smoking status: Former Smoker    Packs/day: 1.00    Years: 20.00    Pack years: 20.00    Quit date: 06/02/2006    Years since quitting: 13.0  . Smokeless tobacco: Never Used  Substance and Sexual  Activity  . Alcohol use: Yes    Alcohol/week: 2.0 - 3.0 standard drinks    Types: 2 - 3 Standard drinks or equivalent per week    Comment: socially  . Drug use: No  . Sexual activity: Yes    Partners: Male  Other Topics Concern  . 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 As of 02/26/17    Divorced.    Former smoker 20+ years quit in 1997 then started again for 5 years and quit 10-12 years ago as of 03/08/17    Social Determinants of Health   Financial Resource Strain:   . Difficulty of Paying Living Expenses:   Food Insecurity:   . Worried About Charity fundraiser in the Last Year:   . Arboriculturist in the Last Year:   Transportation Needs:   . Film/video editor (Medical):   Marland Kitchen Lack of Transportation (Non-Medical):   Physical Activity:   . Days of Exercise per Week:   .  Minutes of Exercise per Session:   Stress:   . Feeling of Stress :   Social Connections:   . Frequency of Communication with Friends and Family:   . Frequency of Social Gatherings with Friends and Family:   . Attends Religious Services:   . Active Member of Clubs or Organizations:   . Attends Archivist Meetings:   Marland Kitchen Marital Status:   Intimate Partner Violence:   . Fear of Current or Ex-Partner:   . Emotionally Abused:   Marland Kitchen Physically Abused:   . Sexually Abused:     Family History  Problem Relation Age of Onset  . Breast cancer Mother        dx late 49s  . Thyroid disease Mother   . Colon polyps Mother   . Prostate cancer Father        dx 32  . Breast cancer Maternal Grandmother        dx late 78s  . Cancer Maternal Uncle        possibly, unsure type     Current Outpatient Medications:  .  acetaminophen (TYLENOL) 500 MG tablet, Take 500-1,000 mg by mouth every 6 (six) hours as needed (for pain.)., Disp: , Rfl:  .  Cholecalciferol (VITAMIN D-3)  125 MCG (5000 UT) TABS, Take 5,000 Units by mouth daily., Disp: , Rfl:  .  Cyanocobalamin (VITAMIN B-12 PO), Take 1 tablet by mouth daily., Disp: , Rfl:  .  dexamethasone (DECADRON) 4 MG tablet, TAKE 2 TABLETS BY MOUTH ONCE A DAY ON THE DAY AFTER CHEMOTHERAPY AND THEN TAKE 2 TABLETS TWO TIMES A DAY FOR 2 DAYS. TAKE WITH FOOD., Disp: 30 tablet, Rfl: 1 .  fluticasone (FLONASE) 50 MCG/ACT nasal spray, Place 2 sprays into both nostrils daily. Max 2 sprays, Disp: 16 g, Rfl: 12 .  gabapentin (NEURONTIN) 300 MG capsule, Take 300 mg by mouth at bedtime., Disp: , Rfl:  .  lidocaine-prilocaine (EMLA) cream, Apply to affected area once, Disp: 30 g, Rfl: 3 .  meloxicam (MOBIC) 15 MG tablet, Take 15 mg by mouth daily., Disp: , Rfl:  .  methocarbamol (ROBAXIN) 750 MG tablet, Take 1 tablet (750 mg total) by mouth every 6 (six) hours as needed for muscle spasms., Disp: 120 tablet, Rfl: 5 .  montelukast (SINGULAIR) 10 MG tablet, Take 1 tablet (10 mg total) by mouth at bedtime., Disp: 90 tablet, Rfl: 3 .  omeprazole (PRILOSEC) 40 MG capsule, Take 40 mg by mouth every evening. , Disp: , Rfl:  .  tiZANidine (ZANAFLEX) 4 MG tablet, Take 4 mg by mouth at bedtime. , Disp: , Rfl:  .  traZODone (DESYREL) 50 MG tablet, Take 1-1.5 tablets (50-75 mg total) by mouth at bedtime as needed for sleep. (Patient taking differently: Take 50 mg by mouth at bedtime as needed for sleep. ), Disp: 135 tablet, Rfl: 4 .  DULoxetine (CYMBALTA) 30 MG capsule, Take 1 capsule (30 mg total) by mouth daily. 1 capsule daily x7 days and then 2 tablets daily to end of this rx and then will refill with 60 mg tablet in future, Disp: 53 capsule, Rfl: 0 .  meclizine (ANTIVERT) 12.5 MG tablet, Take 1 tablet (12.5 mg total) by mouth 3 (three) times daily as needed for dizziness. (Patient not taking: Reported on 06/11/2019), Disp: 90 tablet, Rfl: 1 .  OLANZapine (ZYPREXA) 10 MG tablet, TAKE 1 TABLET BY MOUTH EVERYDAY AT BEDTIME (Patient not taking: Reported on  06/24/2019), Disp: 90 tablet, Rfl: 1 .  traMADol (ULTRAM) 50 MG tablet, Take 1-2 tablets (50-100 mg total) by mouth every 6 (six) hours as needed. (Patient not taking: Reported on 06/11/2019), Disp: 20 tablet, Rfl: 0  Physical exam:  Vitals:   06/25/19 0948 06/25/19 0951  BP:  122/70  Pulse:  84  Temp: (!) 96.8 F (36 C) (!) 96.7 F (35.9 C)  TempSrc: Tympanic Tympanic  Weight: 188 lb 12.8 oz (85.6 kg) 188 lb 12.8 oz (85.6 kg)  Height: '5\' 3"'  (1.6 m) '5\' 3"'  (1.6 m)   Physical Exam Constitutional:      General: She is not in acute distress. Cardiovascular:     Rate and Rhythm: Normal rate and regular rhythm.     Heart sounds: Normal heart sounds.  Pulmonary:     Effort: Pulmonary effort is normal.     Breath sounds: Normal breath sounds.  Abdominal:     General: Bowel sounds are normal.     Palpations: Abdomen is soft.  Skin:    General: Skin is warm and dry.  Neurological:     Mental Status: She is alert and oriented to person, place, and time.      CMP Latest Ref Rng & Units 06/25/2019  Glucose 70 - 99 mg/dL 129(H)  BUN 6 - 20 mg/dL 17  Creatinine 0.44 - 1.00 mg/dL 0.74  Sodium 135 - 145 mmol/L 137  Potassium 3.5 - 5.1 mmol/L 3.6  Chloride 98 - 111 mmol/L 102  CO2 22 - 32 mmol/L 25  Calcium 8.9 - 10.3 mg/dL 8.4(L)  Total Protein 6.5 - 8.1 g/dL 5.9(L)  Total Bilirubin 0.3 - 1.2 mg/dL 0.6  Alkaline Phos 38 - 126 U/L 50  AST 15 - 41 U/L 23  ALT 0 - 44 U/L 27   CBC Latest Ref Rng & Units 06/25/2019  WBC 4.0 - 10.5 K/uL 6.6  Hemoglobin 12.0 - 15.0 g/dL 11.6(L)  Hematocrit 36.0 - 46.0 % 32.8(L)  Platelets 150 - 400 K/uL 251     Assessment and plan- Patient is a 54 y.o. female with prior history of polycythemia now with newly diagnosed invasive mammary carcinoma pathologic prognostic stage Ia of the left breast pT1b pN1 acM0 ER/PR positive HER-2/neu negative. She is s/p lumpectomywith high risk MammaPrint score.She is s/p 4 cycles of dose dense AC chemotherapy.    She  is here for on treatment assessment prior to cycle 7 of weekly Taxol chemotherapy  Taxol dose has been reduced to 65 mg/m since cycle 6.  We will continue the same dose at this time.  Chemo-induced peripheral neuropathy: I will refer her to acupuncture and also add Cymbalta 30 mg which she will take for a week and then increase to 60 mg.  There is interaction noted between Cymbalta and tramadol for possible CNS depression.  However patient is barely taking her tramadol and is willing to stop it we will therefore proceed with Cymbalta at this time.  I will see her back in 2 weeks for cycle 8. Plan is to complete 12 cycles if possible if her neuropathy does not get worse   Visit Diagnosis 1. Encounter for antineoplastic chemotherapy   2. Chemotherapy-induced peripheral neuropathy (HCC)   3. Other fatigue   4. Malignant neoplasm of upper-inner quadrant of left breast in female, estrogen receptor positive ([place])      Dr. Randa Evens, MD, MPH [facility] [phone] 06/27/2019 3:36 PM

## 2019-06-27 NOTE — Telephone Encounter (Signed)
I called pt and made a note about the tramadol interaction and pt will stop tramadol while on cymbalta and it was faxed into her pharmacy

## 2019-06-28 ENCOUNTER — Ambulatory Visit (INDEPENDENT_AMBULATORY_CARE_PROVIDER_SITE_OTHER): Payer: Medicaid Other | Admitting: Psychology

## 2019-06-28 DIAGNOSIS — F418 Other specified anxiety disorders: Secondary | ICD-10-CM | POA: Diagnosis not present

## 2019-07-02 ENCOUNTER — Other Ambulatory Visit: Payer: Self-pay

## 2019-07-02 ENCOUNTER — Inpatient Hospital Stay: Payer: Medicaid Other

## 2019-07-02 VITALS — BP 107/70 | HR 79 | Temp 97.0°F | Resp 16 | Wt 189.4 lb

## 2019-07-02 DIAGNOSIS — Z5111 Encounter for antineoplastic chemotherapy: Secondary | ICD-10-CM | POA: Diagnosis not present

## 2019-07-02 DIAGNOSIS — C50212 Malignant neoplasm of upper-inner quadrant of left female breast: Secondary | ICD-10-CM

## 2019-07-02 DIAGNOSIS — Z95828 Presence of other vascular implants and grafts: Secondary | ICD-10-CM

## 2019-07-02 LAB — CBC WITH DIFFERENTIAL/PLATELET
Abs Immature Granulocytes: 0.22 10*3/uL — ABNORMAL HIGH (ref 0.00–0.07)
Basophils Absolute: 0.1 10*3/uL (ref 0.0–0.1)
Basophils Relative: 1 %
Eosinophils Absolute: 0.1 10*3/uL (ref 0.0–0.5)
Eosinophils Relative: 1 %
HCT: 33.7 % — ABNORMAL LOW (ref 36.0–46.0)
Hemoglobin: 11.9 g/dL — ABNORMAL LOW (ref 12.0–15.0)
Immature Granulocytes: 3 %
Lymphocytes Relative: 7 %
Lymphs Abs: 0.6 10*3/uL — ABNORMAL LOW (ref 0.7–4.0)
MCH: 33.3 pg (ref 26.0–34.0)
MCHC: 35.3 g/dL (ref 30.0–36.0)
MCV: 94.4 fL (ref 80.0–100.0)
Monocytes Absolute: 0.7 10*3/uL (ref 0.1–1.0)
Monocytes Relative: 8 %
Neutro Abs: 6.9 10*3/uL (ref 1.7–7.7)
Neutrophils Relative %: 80 %
Platelets: 268 10*3/uL (ref 150–400)
RBC: 3.57 MIL/uL — ABNORMAL LOW (ref 3.87–5.11)
RDW: 13.7 % (ref 11.5–15.5)
WBC: 8.7 10*3/uL (ref 4.0–10.5)
nRBC: 0 % (ref 0.0–0.2)

## 2019-07-02 LAB — COMPREHENSIVE METABOLIC PANEL
ALT: 27 U/L (ref 0–44)
AST: 23 U/L (ref 15–41)
Albumin: 3.2 g/dL — ABNORMAL LOW (ref 3.5–5.0)
Alkaline Phosphatase: 49 U/L (ref 38–126)
Anion gap: 8 (ref 5–15)
BUN: 22 mg/dL — ABNORMAL HIGH (ref 6–20)
CO2: 26 mmol/L (ref 22–32)
Calcium: 8.5 mg/dL — ABNORMAL LOW (ref 8.9–10.3)
Chloride: 103 mmol/L (ref 98–111)
Creatinine, Ser: 0.67 mg/dL (ref 0.44–1.00)
GFR calc Af Amer: >60 mL/min (ref >60)
GFR calc non Af Amer: >60 mL/min (ref >60)
Glucose, Bld: 123 mg/dL — ABNORMAL HIGH (ref 70–99)
Potassium: 3.9 mmol/L (ref 3.5–5.1)
Sodium: 137 mmol/L (ref 135–145)
Total Bilirubin: 0.7 mg/dL (ref 0.3–1.2)
Total Protein: 6 g/dL — ABNORMAL LOW (ref 6.5–8.1)

## 2019-07-02 MED ORDER — SODIUM CHLORIDE 0.9 % IV SOLN
Freq: Once | INTRAVENOUS | Status: AC
  Filled 2019-07-02: qty 250

## 2019-07-02 MED ORDER — SODIUM CHLORIDE 0.9 % IV SOLN
20.0000 mg | Freq: Once | INTRAVENOUS | Status: AC
  Administered 2019-07-02: 20 mg via INTRAVENOUS
  Filled 2019-07-02: qty 20

## 2019-07-02 MED ORDER — SODIUM CHLORIDE 0.9% FLUSH
10.0000 mL | Freq: Once | INTRAVENOUS | Status: AC
  Administered 2019-07-02: 10 mL via INTRAVENOUS
  Filled 2019-07-02: qty 10

## 2019-07-02 MED ORDER — DIPHENHYDRAMINE HCL 50 MG/ML IJ SOLN
50.0000 mg | Freq: Once | INTRAMUSCULAR | Status: AC
  Administered 2019-07-02: 50 mg via INTRAVENOUS
  Filled 2019-07-02: qty 1

## 2019-07-02 MED ORDER — SODIUM CHLORIDE 0.9 % IV SOLN
65.0000 mg/m2 | Freq: Once | INTRAVENOUS | Status: AC
  Administered 2019-07-02: 126 mg via INTRAVENOUS
  Filled 2019-07-02: qty 21

## 2019-07-02 MED ORDER — HEPARIN SOD (PORK) LOCK FLUSH 100 UNIT/ML IV SOLN
500.0000 [IU] | Freq: Once | INTRAVENOUS | Status: DC | PRN
  Filled 2019-07-02: qty 5

## 2019-07-02 MED ORDER — HEPARIN SOD (PORK) LOCK FLUSH 100 UNIT/ML IV SOLN
500.0000 [IU] | Freq: Once | INTRAVENOUS | Status: AC
  Administered 2019-07-02: 500 [IU] via INTRAVENOUS
  Filled 2019-07-02: qty 5

## 2019-07-02 MED ORDER — HEPARIN SOD (PORK) LOCK FLUSH 100 UNIT/ML IV SOLN
INTRAVENOUS | Status: AC
  Filled 2019-07-02: qty 5

## 2019-07-02 MED ORDER — FAMOTIDINE IN NACL 20-0.9 MG/50ML-% IV SOLN
20.0000 mg | Freq: Once | INTRAVENOUS | Status: AC
  Administered 2019-07-02: 20 mg via INTRAVENOUS
  Filled 2019-07-02: qty 50

## 2019-07-02 NOTE — Progress Notes (Signed)

## 2019-07-03 ENCOUNTER — Encounter: Payer: Self-pay | Admitting: *Deleted

## 2019-07-05 DIAGNOSIS — M5030 Other cervical disc degeneration, unspecified cervical region: Secondary | ICD-10-CM | POA: Diagnosis not present

## 2019-07-05 DIAGNOSIS — M5412 Radiculopathy, cervical region: Secondary | ICD-10-CM | POA: Diagnosis not present

## 2019-07-08 ENCOUNTER — Encounter: Payer: Self-pay | Admitting: Oncology

## 2019-07-08 NOTE — Progress Notes (Signed)
Patient called ahead to review chart for oncology follow-up appointment,  expresses no complaints or concerns at this time.

## 2019-07-09 ENCOUNTER — Inpatient Hospital Stay (HOSPITAL_BASED_OUTPATIENT_CLINIC_OR_DEPARTMENT_OTHER): Payer: Medicaid Other | Admitting: Oncology

## 2019-07-09 ENCOUNTER — Inpatient Hospital Stay: Payer: Medicaid Other

## 2019-07-09 ENCOUNTER — Encounter: Payer: Self-pay | Admitting: *Deleted

## 2019-07-09 ENCOUNTER — Other Ambulatory Visit: Payer: Self-pay

## 2019-07-09 VITALS — BP 128/77 | HR 72 | Temp 96.4°F | Resp 16 | Wt 190.4 lb

## 2019-07-09 DIAGNOSIS — Z5111 Encounter for antineoplastic chemotherapy: Secondary | ICD-10-CM | POA: Diagnosis not present

## 2019-07-09 DIAGNOSIS — C50212 Malignant neoplasm of upper-inner quadrant of left female breast: Secondary | ICD-10-CM

## 2019-07-09 DIAGNOSIS — G620 Drug-induced polyneuropathy: Secondary | ICD-10-CM | POA: Diagnosis not present

## 2019-07-09 DIAGNOSIS — T451X5A Adverse effect of antineoplastic and immunosuppressive drugs, initial encounter: Secondary | ICD-10-CM

## 2019-07-09 DIAGNOSIS — D6481 Anemia due to antineoplastic chemotherapy: Secondary | ICD-10-CM

## 2019-07-09 DIAGNOSIS — Z170 Estrogen receptor positive status [ER+]: Secondary | ICD-10-CM | POA: Diagnosis not present

## 2019-07-09 DIAGNOSIS — T451X5D Adverse effect of antineoplastic and immunosuppressive drugs, subsequent encounter: Secondary | ICD-10-CM | POA: Diagnosis not present

## 2019-07-09 LAB — COMPREHENSIVE METABOLIC PANEL
ALT: 26 U/L (ref 0–44)
AST: 22 U/L (ref 15–41)
Albumin: 3.1 g/dL — ABNORMAL LOW (ref 3.5–5.0)
Alkaline Phosphatase: 48 U/L (ref 38–126)
Anion gap: 9 (ref 5–15)
BUN: 20 mg/dL (ref 6–20)
CO2: 24 mmol/L (ref 22–32)
Calcium: 8.6 mg/dL — ABNORMAL LOW (ref 8.9–10.3)
Chloride: 104 mmol/L (ref 98–111)
Creatinine, Ser: 0.71 mg/dL (ref 0.44–1.00)
GFR calc Af Amer: >60 mL/min (ref >60)
GFR calc non Af Amer: >60 mL/min (ref >60)
Glucose, Bld: 110 mg/dL — ABNORMAL HIGH (ref 70–99)
Potassium: 3.7 mmol/L (ref 3.5–5.1)
Sodium: 137 mmol/L (ref 135–145)
Total Bilirubin: 0.8 mg/dL (ref 0.3–1.2)
Total Protein: 6.1 g/dL — ABNORMAL LOW (ref 6.5–8.1)

## 2019-07-09 LAB — CBC WITH DIFFERENTIAL/PLATELET
Abs Immature Granulocytes: 0.23 10*3/uL — ABNORMAL HIGH (ref 0.00–0.07)
Basophils Absolute: 0.1 10*3/uL (ref 0.0–0.1)
Basophils Relative: 1 %
Eosinophils Absolute: 0.2 10*3/uL (ref 0.0–0.5)
Eosinophils Relative: 3 %
HCT: 33.8 % — ABNORMAL LOW (ref 36.0–46.0)
Hemoglobin: 11.7 g/dL — ABNORMAL LOW (ref 12.0–15.0)
Immature Granulocytes: 4 %
Lymphocytes Relative: 16 %
Lymphs Abs: 1 10*3/uL (ref 0.7–4.0)
MCH: 32.6 pg (ref 26.0–34.0)
MCHC: 34.6 g/dL (ref 30.0–36.0)
MCV: 94.2 fL (ref 80.0–100.0)
Monocytes Absolute: 0.8 10*3/uL (ref 0.1–1.0)
Monocytes Relative: 13 %
Neutro Abs: 3.7 10*3/uL (ref 1.7–7.7)
Neutrophils Relative %: 63 %
Platelets: 249 10*3/uL (ref 150–400)
RBC: 3.59 MIL/uL — ABNORMAL LOW (ref 3.87–5.11)
RDW: 13.7 % (ref 11.5–15.5)
WBC: 5.9 10*3/uL (ref 4.0–10.5)
nRBC: 0 % (ref 0.0–0.2)

## 2019-07-09 MED ORDER — SODIUM CHLORIDE 0.9 % IV SOLN
20.0000 mg | Freq: Once | INTRAVENOUS | Status: AC
  Administered 2019-07-09: 11:00:00 20 mg via INTRAVENOUS
  Filled 2019-07-09: qty 20

## 2019-07-09 MED ORDER — HEPARIN SOD (PORK) LOCK FLUSH 100 UNIT/ML IV SOLN
500.0000 [IU] | Freq: Once | INTRAVENOUS | Status: DC
  Filled 2019-07-09: qty 5

## 2019-07-09 MED ORDER — DIPHENHYDRAMINE HCL 50 MG/ML IJ SOLN
50.0000 mg | Freq: Once | INTRAMUSCULAR | Status: AC
  Administered 2019-07-09: 50 mg via INTRAVENOUS
  Filled 2019-07-09: qty 1

## 2019-07-09 MED ORDER — HEPARIN SOD (PORK) LOCK FLUSH 100 UNIT/ML IV SOLN
500.0000 [IU] | Freq: Once | INTRAVENOUS | Status: AC | PRN
  Administered 2019-07-09: 500 [IU]
  Filled 2019-07-09: qty 5

## 2019-07-09 MED ORDER — SODIUM CHLORIDE 0.9 % IV SOLN
Freq: Once | INTRAVENOUS | Status: AC
  Filled 2019-07-09: qty 250

## 2019-07-09 MED ORDER — SODIUM CHLORIDE 0.9 % IV SOLN
65.0000 mg/m2 | Freq: Once | INTRAVENOUS | Status: AC
  Administered 2019-07-09: 126 mg via INTRAVENOUS
  Filled 2019-07-09: qty 21

## 2019-07-09 MED ORDER — SODIUM CHLORIDE 0.9% FLUSH
10.0000 mL | Freq: Once | INTRAVENOUS | Status: AC
  Administered 2019-07-09: 10 mL via INTRAVENOUS
  Filled 2019-07-09: qty 10

## 2019-07-09 MED ORDER — FAMOTIDINE IN NACL 20-0.9 MG/50ML-% IV SOLN
20.0000 mg | Freq: Once | INTRAVENOUS | Status: AC
  Administered 2019-07-09: 20 mg via INTRAVENOUS
  Filled 2019-07-09: qty 50

## 2019-07-09 NOTE — Progress Notes (Signed)
Pharmacist Chemotherapy Monitoring - Follow Up Assessment    I verify that I have reviewed each item in the below checklist:  . Regimen for the patient is scheduled for the appropriate day and plan matches scheduled date. Marland Kitchen Appropriate non-routine labs are ordered dependent on drug ordered. . If applicable, additional medications reviewed and ordered per protocol based on lifetime cumulative doses and/or treatment regimen.   Plan for follow-up and/or issues identified: No . I-vent associated with next due treatment: No . MD and/or nursing notified: No  Sanjiv Castorena K 07/09/2019 10:38 AM

## 2019-07-12 NOTE — Progress Notes (Signed)
Hematology/Oncology Consult note [facility]  [phone] [phone]  Patient Care Team: McLean-Scocuzza, Nino Glow, MD as PCP - General (Internal Medicine) Rico Junker, RN as Registered Nurse   Name of the patient: Leah [name]  [id number]  05-13-1965   Date of visit: 07/12/19  Diagnosis- pathological prognostic stage Ia invasive mammary carcinomapT1b pN1 cM0 ER/PR positive HER-2/neu negative s/p lumpectomy  Chief complaint/ Reason for visit-on treatment assessment prior to cycle 9 of weekly Taxol chemotherapy  Heme/Onc history: Patient is a 54 year old postmenopausal female G2 P2 L2 who recently underwent screening bilateral mammogram which showed area of concern in her left breast. This was followed by an ultrasound and Core biopsy. Ultrasound showed 1 x 1.1 x 0.7 cm hypoechoic mass at the 10:30 position of the left breast. Left axilla appeared normal. 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  Final pathology showed 9 mm grade 1 invasive mammary carcinoma1 out of 2 lymph nodes involved with macro metastatic carcinoma 3.5 mm. Anterior margin was focally positive for which patientunderwent reexcision surgery with negative marginsPT1BPN1a (sn)  MammaPrint score came back as high risk with a risk of recurrence of 29% at 10 years without chemotherapy  Patient completed 4 cycles of dose dense AC chemotherapy and is currently on weekly Taxol  Interval history-patient reports ongoing fatigue.  She has also had significant weight gain.  Reports tingling numbness in her hands and feet Which is mildly worse as compared to last 2 weeks.  She just started taking Cymbalta about 10 days ago.  ECOG PS- 1 Pain scale- 0   Review of systems- Review of Systems  Constitutional: Positive for malaise/fatigue. Negative for chills, fever and weight loss.  HENT: Negative for  congestion, ear discharge and nosebleeds.   Eyes: Negative for blurred vision.  Respiratory: Negative for cough, hemoptysis, sputum production, shortness of breath and wheezing.   Cardiovascular: Negative for chest pain, palpitations, orthopnea and claudication.  Gastrointestinal: Negative for abdominal pain, blood in stool, constipation, diarrhea, heartburn, melena, nausea and vomiting.  Genitourinary: Negative for dysuria, flank pain, frequency, hematuria and urgency.  Musculoskeletal: Negative for back pain, joint pain and myalgias.  Skin: Negative for rash.  Neurological: Positive for sensory change (Peripheral neuropathy). Negative for dizziness, tingling, focal weakness, seizures, weakness and headaches.  Endo/Heme/Allergies: Does not bruise/bleed easily.  Psychiatric/Behavioral: Negative for depression and suicidal ideas. The patient does not have insomnia.      Allergies  Allergen Reactions  . Hydrocodone-Acetaminophen Hives    All over her body.  . Lactose Intolerance (Gi) Other (See Comments)    Bloating and GI upset  . Penicillins Rash    Did it involve swelling of the face/tongue/throat, SOB, or low BP? Unknown Did it involve sudden or severe rash/hives, skin peeling, or any reaction on the inside of your mouth or nose? Yes Did you need to seek medical attention at a hospital or doctor's office? Unknown When did it last happen? Childhood reaction. If all above answers are "NO", may proceed with cephalosporin use.   . Sulfa Antibiotics Rash    Full body rash      Past Medical History:  Diagnosis Date  . Anxiety   . Asthma    as a child  . Breast cancer ([place])   . Carpal tunnel syndrome    R hand   . Colon polyps   . Depression   . Eosinophilic esophagitis  noted in [place] with GI path report 09/29/14   . Esophageal stricture    s/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 Edwin Cap   . Family history of breast cancer   . Family history of  prostate cancer   . Herniated disc, cervical    s/p epidural injection  . History of kidney stones   . Hyperlipidemia   . Low back pain   . Migraines      Past Surgical History:  Procedure Laterality Date  . BREAST BIOPSY Left 12/25/2018   [place] BX, PATH PENDING  . BREAST LUMPECTOMY WITH NEEDLE LOCALIZATION AND AXILLARY SENTINEL LYMPH NODE BX Left 01/16/2019   Procedure: LEFT BREAST LUMPECTOMY WITH NEEDLE LOCALIZATION AND SENTINEL LYMPH NODE BIOPSY;  Surgeon: Jovita Kussmaul, MD;  Location: [facility] ORS;  Service: General;  Laterality: Left;  . CESAREAN SECTION    . COLONOSCOPY WITH PROPOFOL N/A 03/27/2017   Procedure: COLONOSCOPY WITH PROPOFOL;  Surgeon: Jonathon Bellows, MD;  Location: [facility] ENDOSCOPY;  Service: Gastroenterology;  Laterality: N/A;  . ESOPHAGOGASTRODUODENOSCOPY    . ESOPHAGOGASTRODUODENOSCOPY (EGD) WITH PROPOFOL N/A 03/27/2017   Procedure: ESOPHAGOGASTRODUODENOSCOPY (EGD) WITH PROPOFOL WITH DILATION;  Surgeon: Jonathon Bellows, MD;  Location: [facility] ENDOSCOPY;  Service: Gastroenterology;  Laterality: N/A;  . PORTACATH PLACEMENT N/A 02/15/2019   Procedure: INSERTION PORT-A-CATH WITH POSSIBLE ULTRASOUND;  Surgeon: Jovita Kussmaul, MD;  Location: [facility] ORS;  Service: General;  Laterality: N/A;  . RE-EXCISION OF BREAST CANCER,SUPERIOR MARGINS Left 02/15/2019   Procedure: RE-EXCISION OF LEFT BREAST CANCER ANTERIOR MARGINS;  Surgeon: Jovita Kussmaul, MD;  Location: [facility] ORS;  Service: General;  Laterality: Left;  . THROAT SURGERY  2015  . UMBILICAL HERNIA REPAIR  2006    x 2   w mesh per pt    Social History   Socioeconomic History  . Marital status: Significant Other    Spouse name: Not on file  . Number of children: Not on file  . Years of education: Not on file  . Highest education level: Not on file  Occupational History  . Not on file  Tobacco Use  . Smoking status: Former Smoker    Packs/day: 1.00    Years: 20.00    Pack years: 20.00    Quit date: 06/02/2006    Years since  quitting: 13.1  . Smokeless tobacco: Never Used  Substance and Sexual Activity  . Alcohol use: Yes    Alcohol/week: 2.0 - 3.0 standard drinks    Types: 2 - 3 Standard drinks or equivalent per week    Comment: socially  . Drug use: No  . Sexual activity: Yes    Partners: Male  Other Topics Concern  . 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 As of 02/26/17    Divorced.    Former smoker 20+ years quit in 1997 then started again for 5 years and quit 10-12 years ago as of 03/08/17    Social Determinants of Health   Financial Resource Strain:   . Difficulty of Paying Living Expenses:   Food Insecurity:   . Worried About Charity fundraiser in the Last Year:   . Arboriculturist in the Last Year:   Transportation Needs:   . Film/video editor (Medical):   Marland Kitchen Lack of Transportation (Non-Medical):  Physical Activity:   . Days of Exercise per Week:   . Minutes of Exercise per Session:   Stress:   . Feeling of Stress :   Social Connections:   . Frequency of Communication with Friends and Family:   . Frequency of Social Gatherings with Friends and Family:   . Attends Religious Services:   . Active Member of Clubs or Organizations:   . Attends Archivist Meetings:   Marland Kitchen Marital Status:   Intimate Partner Violence:   . Fear of Current or Ex-Partner:   . Emotionally Abused:   Marland Kitchen Physically Abused:   . Sexually Abused:     Family History  Problem Relation Age of Onset  . Breast cancer Mother        dx late 20s  . Thyroid disease Mother   . Colon polyps Mother   . Prostate cancer Father        dx 44  . Breast cancer Maternal Grandmother        dx late 60s  . Cancer Maternal Uncle        possibly, unsure type     Current Outpatient Medications:  .  acetaminophen (TYLENOL) 500 MG tablet, Take 500-1,000 mg by mouth every 6 (six) hours  as needed (for pain.)., Disp: , Rfl:  .  Cholecalciferol (VITAMIN D-3) 125 MCG (5000 UT) TABS, Take 5,000 Units by mouth daily., Disp: , Rfl:  .  Cyanocobalamin (VITAMIN B-12 PO), Take 1 tablet by mouth daily., Disp: , Rfl:  .  dexamethasone (DECADRON) 4 MG tablet, TAKE 2 TABLETS BY MOUTH ONCE A DAY ON THE DAY AFTER CHEMOTHERAPY AND THEN TAKE 2 TABLETS TWO TIMES A DAY FOR 2 DAYS. TAKE WITH FOOD., Disp: 30 tablet, Rfl: 1 .  DULoxetine (CYMBALTA) 30 MG capsule, Take 1 capsule (30 mg total) by mouth daily. 1 capsule daily x7 days and then 2 tablets daily to end of this rx and then will refill with 60 mg tablet in future, Disp: 53 capsule, Rfl: 0 .  fluticasone (FLONASE) 50 MCG/ACT nasal spray, Place 2 sprays into both nostrils daily. Max 2 sprays, Disp: 16 g, Rfl: 12 .  gabapentin (NEURONTIN) 300 MG capsule, Take 300 mg by mouth at bedtime., Disp: , Rfl:  .  lidocaine-prilocaine (EMLA) cream, Apply to affected area once, Disp: 30 g, Rfl: 3 .  meclizine (ANTIVERT) 12.5 MG tablet, Take 1 tablet (12.5 mg total) by mouth 3 (three) times daily as needed for dizziness. (Patient not taking: Reported on 06/11/2019), Disp: 90 tablet, Rfl: 1 .  meloxicam (MOBIC) 15 MG tablet, Take 15 mg by mouth daily., Disp: , Rfl:  .  methocarbamol (ROBAXIN) 750 MG tablet, Take 1 tablet (750 mg total) by mouth every 6 (six) hours as needed for muscle spasms., Disp: 120 tablet, Rfl: 5 .  montelukast (SINGULAIR) 10 MG tablet, Take 1 tablet (10 mg total) by mouth at bedtime., Disp: 90 tablet, Rfl: 3 .  OLANZapine (ZYPREXA) 10 MG tablet, TAKE 1 TABLET BY MOUTH EVERYDAY AT BEDTIME (Patient not taking: Reported on 06/24/2019), Disp: 90 tablet, Rfl: 1 .  omeprazole (PRILOSEC) 40 MG capsule, Take 40 mg by mouth every evening. , Disp: , Rfl:  .  tiZANidine (ZANAFLEX) 4 MG tablet, Take 4 mg by mouth at bedtime. , Disp: , Rfl:  .  traMADol (ULTRAM) 50 MG tablet, Take 1-2 tablets (50-100 mg total) by mouth every 6 (six) hours as needed.  (Patient not taking: Reported on 06/11/2019), Disp: 20  tablet, Rfl: 0 .  traZODone (DESYREL) 50 MG tablet, Take 1-1.5 tablets (50-75 mg total) by mouth at bedtime as needed for sleep. (Patient taking differently: Take 50 mg by mouth at bedtime as needed for sleep. ), Disp: 135 tablet, Rfl: 4  Physical exam:  Vitals:   07/09/19 0928  BP: 128/77  Pulse: 72  Resp: 16  Temp: (!) 96.4 F (35.8 C)  SpO2: 98%  Weight: 190 lb 6.4 oz (86.4 kg)   Physical Exam Constitutional:      General: She is not in acute distress. Cardiovascular:     Rate and Rhythm: Normal rate and regular rhythm.     Heart sounds: Normal heart sounds.  Pulmonary:     Effort: Pulmonary effort is normal.     Breath sounds: Normal breath sounds.  Abdominal:     General: Bowel sounds are normal.     Palpations: Abdomen is soft.  Skin:    General: Skin is warm and dry.  Neurological:     Mental Status: She is alert and oriented to person, place, and time.      CMP Latest Ref Rng & Units 07/09/2019  Glucose 70 - 99 mg/dL 110(H)  BUN 6 - 20 mg/dL 20  Creatinine 0.44 - 1.00 mg/dL 0.71  Sodium 135 - 145 mmol/L 137  Potassium 3.5 - 5.1 mmol/L 3.7  Chloride 98 - 111 mmol/L 104  CO2 22 - 32 mmol/L 24  Calcium 8.9 - 10.3 mg/dL 8.6(L)  Total Protein 6.5 - 8.1 g/dL 6.1(L)  Total Bilirubin 0.3 - 1.2 mg/dL 0.8  Alkaline Phos 38 - 126 U/L 48  AST 15 - 41 U/L 22  ALT 0 - 44 U/L 26   CBC Latest Ref Rng & Units 07/09/2019  WBC 4.0 - 10.5 K/uL 5.9  Hemoglobin 12.0 - 15.0 g/dL 11.7(L)  Hematocrit 36.0 - 46.0 % 33.8(L)  Platelets 150 - 400 K/uL 249     Assessment and plan- Patient is a 54 y.o. female with prior history of polycythemia now with newly diagnosed invasive mammary carcinoma pathologic prognostic stage Ia of the left breast pT1b pN1 acM0 ER/PR positive HER-2/neu negative. She is s/p lumpectomywith high risk MammaPrint score.She is s/p 4 cycles of dose dense AC chemotherapy.    She is here for on treatment  assessment prior to cycle 9 of weekly Taxol chemotherapy  Counts okay to proceed with cycle 9 of weekly Taxol chemotherapy today.  She will proceed with cycle 10 next week and I will see her back in 2 weeks for cycle 11.  She has been receiving Taxol at 65 mg per metered squared due to ongoing neuropathy.  Chemo-induced peripheral neuropathy: She is going through acupuncture at this time and is also on Cymbalta.  There is a risk of possible CNS depression with a combination of Cymbalta and tramadol.  However patient does not take her tramadol consistently and uses it occasionally when she has a headache.  This can therefore be monitored  Chemo-induced anemia: Mild continue to monitor   Visit Diagnosis 1. Malignant neoplasm of upper-inner quadrant of left breast in female, estrogen receptor positive ([place])   2. Encounter for antineoplastic chemotherapy   3. Chemotherapy-induced peripheral neuropathy ([place])   4. Antineoplastic chemotherapy induced anemia      Dr. Randa Evens, MD, MPH [facility] [phone] 07/12/2019 8:08 AM

## 2019-07-16 ENCOUNTER — Other Ambulatory Visit: Payer: Self-pay

## 2019-07-16 ENCOUNTER — Inpatient Hospital Stay: Payer: Medicaid Other

## 2019-07-16 ENCOUNTER — Inpatient Hospital Stay: Payer: Medicaid Other | Attending: Oncology

## 2019-07-16 VITALS — BP 129/68 | HR 81 | Temp 97.7°F | Resp 17 | Wt 191.4 lb

## 2019-07-16 DIAGNOSIS — Z8719 Personal history of other diseases of the digestive system: Secondary | ICD-10-CM | POA: Diagnosis not present

## 2019-07-16 DIAGNOSIS — Z170 Estrogen receptor positive status [ER+]: Secondary | ICD-10-CM

## 2019-07-16 DIAGNOSIS — D6481 Anemia due to antineoplastic chemotherapy: Secondary | ICD-10-CM | POA: Insufficient documentation

## 2019-07-16 DIAGNOSIS — G620 Drug-induced polyneuropathy: Secondary | ICD-10-CM | POA: Insufficient documentation

## 2019-07-16 DIAGNOSIS — Z8042 Family history of malignant neoplasm of prostate: Secondary | ICD-10-CM | POA: Insufficient documentation

## 2019-07-16 DIAGNOSIS — Z95828 Presence of other vascular implants and grafts: Secondary | ICD-10-CM

## 2019-07-16 DIAGNOSIS — C50212 Malignant neoplasm of upper-inner quadrant of left female breast: Secondary | ICD-10-CM

## 2019-07-16 DIAGNOSIS — Z87891 Personal history of nicotine dependence: Secondary | ICD-10-CM | POA: Insufficient documentation

## 2019-07-16 DIAGNOSIS — Z803 Family history of malignant neoplasm of breast: Secondary | ICD-10-CM | POA: Diagnosis not present

## 2019-07-16 DIAGNOSIS — Z5111 Encounter for antineoplastic chemotherapy: Secondary | ICD-10-CM | POA: Diagnosis not present

## 2019-07-16 DIAGNOSIS — T451X5A Adverse effect of antineoplastic and immunosuppressive drugs, initial encounter: Secondary | ICD-10-CM | POA: Diagnosis not present

## 2019-07-16 LAB — COMPREHENSIVE METABOLIC PANEL
ALT: 25 U/L (ref 0–44)
AST: 23 U/L (ref 15–41)
Albumin: 3.2 g/dL — ABNORMAL LOW (ref 3.5–5.0)
Alkaline Phosphatase: 50 U/L (ref 38–126)
Anion gap: 10 (ref 5–15)
BUN: 19 mg/dL (ref 6–20)
CO2: 26 mmol/L (ref 22–32)
Calcium: 8.3 mg/dL — ABNORMAL LOW (ref 8.9–10.3)
Chloride: 101 mmol/L (ref 98–111)
Creatinine, Ser: 0.59 mg/dL (ref 0.44–1.00)
GFR calc Af Amer: >60 mL/min (ref >60)
GFR calc non Af Amer: >60 mL/min (ref >60)
Glucose, Bld: 103 mg/dL — ABNORMAL HIGH (ref 70–99)
Potassium: 3.5 mmol/L (ref 3.5–5.1)
Sodium: 137 mmol/L (ref 135–145)
Total Bilirubin: 0.8 mg/dL (ref 0.3–1.2)
Total Protein: 5.7 g/dL — ABNORMAL LOW (ref 6.5–8.1)

## 2019-07-16 LAB — CBC WITH DIFFERENTIAL/PLATELET
Abs Immature Granulocytes: 0.38 10*3/uL — ABNORMAL HIGH (ref 0.00–0.07)
Basophils Absolute: 0.1 10*3/uL (ref 0.0–0.1)
Basophils Relative: 1 %
Eosinophils Absolute: 0.1 10*3/uL (ref 0.0–0.5)
Eosinophils Relative: 2 %
HCT: 34.1 % — ABNORMAL LOW (ref 36.0–46.0)
Hemoglobin: 11.9 g/dL — ABNORMAL LOW (ref 12.0–15.0)
Immature Granulocytes: 6 %
Lymphocytes Relative: 15 %
Lymphs Abs: 1 10*3/uL (ref 0.7–4.0)
MCH: 32.9 pg (ref 26.0–34.0)
MCHC: 34.9 g/dL (ref 30.0–36.0)
MCV: 94.2 fL (ref 80.0–100.0)
Monocytes Absolute: 0.7 10*3/uL (ref 0.1–1.0)
Monocytes Relative: 11 %
Neutro Abs: 4.3 10*3/uL (ref 1.7–7.7)
Neutrophils Relative %: 65 %
Platelets: 269 10*3/uL (ref 150–400)
RBC: 3.62 MIL/uL — ABNORMAL LOW (ref 3.87–5.11)
RDW: 13.4 % (ref 11.5–15.5)
WBC: 6.5 10*3/uL (ref 4.0–10.5)
nRBC: 0.3 % — ABNORMAL HIGH (ref 0.0–0.2)

## 2019-07-16 MED ORDER — SODIUM CHLORIDE 0.9% FLUSH
10.0000 mL | INTRAVENOUS | Status: DC | PRN
  Administered 2019-07-16: 10 mL via INTRAVENOUS
  Filled 2019-07-16: qty 10

## 2019-07-16 MED ORDER — FAMOTIDINE IN NACL 20-0.9 MG/50ML-% IV SOLN
20.0000 mg | Freq: Once | INTRAVENOUS | Status: AC
  Administered 2019-07-16: 20 mg via INTRAVENOUS
  Filled 2019-07-16: qty 50

## 2019-07-16 MED ORDER — SODIUM CHLORIDE 0.9 % IV SOLN
20.0000 mg | Freq: Once | INTRAVENOUS | Status: AC
  Administered 2019-07-16: 20 mg via INTRAVENOUS
  Filled 2019-07-16: qty 20

## 2019-07-16 MED ORDER — SODIUM CHLORIDE 0.9 % IV SOLN
Freq: Once | INTRAVENOUS | Status: AC
  Filled 2019-07-16: qty 250

## 2019-07-16 MED ORDER — SODIUM CHLORIDE 0.9 % IV SOLN
65.0000 mg/m2 | Freq: Once | INTRAVENOUS | Status: AC
  Administered 2019-07-16: 126 mg via INTRAVENOUS
  Filled 2019-07-16: qty 21

## 2019-07-16 MED ORDER — HEPARIN SOD (PORK) LOCK FLUSH 100 UNIT/ML IV SOLN
500.0000 [IU] | Freq: Once | INTRAVENOUS | Status: AC | PRN
  Administered 2019-07-16: 500 [IU]
  Filled 2019-07-16: qty 5

## 2019-07-16 MED ORDER — DIPHENHYDRAMINE HCL 50 MG/ML IJ SOLN
50.0000 mg | Freq: Once | INTRAMUSCULAR | Status: AC
  Administered 2019-07-16: 50 mg via INTRAVENOUS
  Filled 2019-07-16: qty 1

## 2019-07-16 NOTE — Progress Notes (Signed)

## 2019-07-19 ENCOUNTER — Encounter: Payer: Self-pay | Admitting: Internal Medicine

## 2019-07-19 ENCOUNTER — Other Ambulatory Visit: Payer: Self-pay

## 2019-07-19 ENCOUNTER — Ambulatory Visit (INDEPENDENT_AMBULATORY_CARE_PROVIDER_SITE_OTHER): Payer: Medicaid Other | Admitting: Internal Medicine

## 2019-07-19 VITALS — BP 118/99 | HR 105 | Temp 98.6°F | Ht 63.0 in | Wt 191.6 lb

## 2019-07-19 DIAGNOSIS — F419 Anxiety disorder, unspecified: Secondary | ICD-10-CM

## 2019-07-19 DIAGNOSIS — Z1389 Encounter for screening for other disorder: Secondary | ICD-10-CM

## 2019-07-19 DIAGNOSIS — E559 Vitamin D deficiency, unspecified: Secondary | ICD-10-CM

## 2019-07-19 DIAGNOSIS — E785 Hyperlipidemia, unspecified: Secondary | ICD-10-CM | POA: Diagnosis not present

## 2019-07-19 DIAGNOSIS — F329 Major depressive disorder, single episode, unspecified: Secondary | ICD-10-CM

## 2019-07-19 DIAGNOSIS — C50919 Malignant neoplasm of unspecified site of unspecified female breast: Secondary | ICD-10-CM

## 2019-07-19 DIAGNOSIS — E538 Deficiency of other specified B group vitamins: Secondary | ICD-10-CM

## 2019-07-19 DIAGNOSIS — F32A Depression, unspecified: Secondary | ICD-10-CM

## 2019-07-19 DIAGNOSIS — C50912 Malignant neoplasm of unspecified site of left female breast: Secondary | ICD-10-CM | POA: Diagnosis not present

## 2019-07-19 DIAGNOSIS — Z1329 Encounter for screening for other suspected endocrine disorder: Secondary | ICD-10-CM | POA: Diagnosis not present

## 2019-07-19 DIAGNOSIS — R635 Abnormal weight gain: Secondary | ICD-10-CM

## 2019-07-19 DIAGNOSIS — R739 Hyperglycemia, unspecified: Secondary | ICD-10-CM | POA: Diagnosis not present

## 2019-07-19 NOTE — Progress Notes (Signed)
Pre visit review using our clinic review tool, if applicable. No additional management support is needed unless otherwise documented below in the visit note. 

## 2019-07-19 NOTE — Progress Notes (Signed)
Chief Complaint  Patient presents with  . Follow-up   F/u  1. Breast cancer in chemo every 2 weeks with labs weekly and 2 more sessions of chemo before radiation followed Dr. Janese Banks C/o dry mouth, weight gain due to steroids, energy level and mood waxes and weans  2. 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 If helping  3. Anxiety/depression will f/u with osman 07/25/19 at times mood is low on cymbalta 30 mg qd and for #2 4. HLD elevated will check labs summer 2021    Review of Systems  Constitutional: Negative for weight loss.  HENT: Negative for hearing loss.   Eyes: Negative for blurred vision.  Respiratory: Negative for shortness of breath.   Cardiovascular: Negative for chest pain.  Gastrointestinal: Negative for abdominal pain.  Musculoskeletal: Negative for falls.  Skin: Negative for rash.  Neurological: Negative for headaches.  Psychiatric/Behavioral: Positive for depression. The patient is nervous/anxious.    Past Medical History:  Diagnosis Date  . Anxiety   . Asthma    as a child  . Breast cancer ([place])   . Carpal tunnel syndrome    R hand   . Colon polyps   . Depression   . Eosinophilic esophagitis    noted in [place] with GI path report 09/29/14   . Esophageal stricture    s/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 Edwin Cap   . Family history of breast cancer   . Family history of prostate cancer   . Herniated disc, cervical    s/p epidural injection  . History of kidney stones   . Hyperlipidemia   . Low back pain   . Migraines    Past Surgical History:  Procedure Laterality Date  . BREAST BIOPSY Left 12/25/2018   [place] BX, PATH PENDING  . BREAST LUMPECTOMY WITH NEEDLE LOCALIZATION AND AXILLARY SENTINEL LYMPH NODE BX Left 01/16/2019   Procedure: LEFT BREAST LUMPECTOMY WITH NEEDLE LOCALIZATION AND SENTINEL LYMPH NODE BIOPSY;  Surgeon: Jovita Kussmaul, MD;  Location: [facility] ORS;  Service:  General;  Laterality: Left;  . CESAREAN SECTION    . COLONOSCOPY WITH PROPOFOL N/A 03/27/2017   Procedure: COLONOSCOPY WITH PROPOFOL;  Surgeon: Jonathon Bellows, MD;  Location: [facility] ENDOSCOPY;  Service: Gastroenterology;  Laterality: N/A;  . ESOPHAGOGASTRODUODENOSCOPY    . ESOPHAGOGASTRODUODENOSCOPY (EGD) WITH PROPOFOL N/A 03/27/2017   Procedure: ESOPHAGOGASTRODUODENOSCOPY (EGD) WITH PROPOFOL WITH DILATION;  Surgeon: Jonathon Bellows, MD;  Location: [facility] ENDOSCOPY;  Service: Gastroenterology;  Laterality: N/A;  . PORTACATH PLACEMENT N/A 02/15/2019   Procedure: INSERTION PORT-A-CATH WITH POSSIBLE ULTRASOUND;  Surgeon: Jovita Kussmaul, MD;  Location: [facility] ORS;  Service: General;  Laterality: N/A;  . RE-EXCISION OF BREAST CANCER,SUPERIOR MARGINS Left 02/15/2019   Procedure: RE-EXCISION OF LEFT BREAST CANCER ANTERIOR MARGINS;  Surgeon: Jovita Kussmaul, MD;  Location: [facility] ORS;  Service: General;  Laterality: Left;  . THROAT SURGERY  2015  . UMBILICAL HERNIA REPAIR  2006    x 2   w mesh per pt   Family History  Problem Relation Age of Onset  . Breast cancer Mother        dx late 71s  . Thyroid disease Mother   . Colon polyps Mother   . Prostate cancer Father        dx 10  . Breast cancer Maternal Grandmother        dx late 45s  . Cancer Maternal Uncle  possibly, unsure type   Social History   Socioeconomic History  . Marital status: Significant Other    Spouse name: Not on file  . Number of children: Not on file  . Years of education: Not on file  . Highest education level: Not on file  Occupational History  . Not on file  Tobacco Use  . Smoking status: Former Smoker    Packs/day: 1.00    Years: 20.00    Pack years: 20.00    Quit date: 06/02/2006    Years since quitting: 13.1  . Smokeless tobacco: Never Used  Substance and Sexual Activity  . Alcohol use: Yes    Alcohol/week: 2.0 - 3.0 standard drinks    Types: 2 - 3 Standard drinks or equivalent per week    Comment: socially  .  Drug use: No  . Sexual activity: Yes    Partners: Male  Other Topics Concern  . 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 As of 02/26/17    Divorced.    Former smoker 20+ years quit in 1997 then started again for 5 years and quit 10-12 years ago as of 03/08/17    Social Determinants of Health   Financial Resource Strain:   . Difficulty of Paying Living Expenses:   Food Insecurity:   . Worried About Charity fundraiser in the Last Year:   . Arboriculturist in the Last Year:   Transportation Needs:   . Film/video editor (Medical):   Marland Kitchen Lack of Transportation (Non-Medical):   Physical Activity:   . Days of Exercise per Week:   . Minutes of Exercise per Session:   Stress:   . Feeling of Stress :   Social Connections:   . Frequency of Communication with Friends and Family:   . Frequency of Social Gatherings with Friends and Family:   . Attends Religious Services:   . Active Member of Clubs or Organizations:   . Attends Archivist Meetings:   Marland Kitchen Marital Status:   Intimate Partner Violence:   . Fear of Current or Ex-Partner:   . Emotionally Abused:   Marland Kitchen Physically Abused:   . Sexually Abused:    Current Meds  Medication Sig  . acetaminophen (TYLENOL) 500 MG tablet Take 500-1,000 mg by mouth every 6 (six) hours as needed (for pain.).  Marland Kitchen Cholecalciferol (VITAMIN D-3) 125 MCG (5000 UT) TABS Take 5,000 Units by mouth daily.  . Cyanocobalamin (VITAMIN B-12 PO) Take 1 tablet by mouth daily.  Marland Kitchen dexamethasone (DECADRON) 4 MG tablet TAKE 2 TABLETS BY MOUTH ONCE A DAY ON THE DAY AFTER CHEMOTHERAPY AND THEN TAKE 2 TABLETS TWO TIMES A DAY FOR 2 DAYS. TAKE WITH FOOD.  . DULoxetine (CYMBALTA) 30 MG capsule Take 1 capsule (30 mg total) by mouth daily. 1 capsule daily x7 days and then 2 tablets daily to end of this rx and then will refill with 60 mg tablet  in future  . fluticasone (FLONASE) 50 MCG/ACT nasal spray Place 2 sprays into both nostrils daily. Max 2 sprays  . gabapentin (NEURONTIN) 300 MG capsule Take 300 mg by mouth at bedtime.  . lidocaine-prilocaine (EMLA) cream Apply to affected area once  . meclizine (ANTIVERT) 12.5 MG tablet Take 1 tablet (12.5 mg total) by mouth 3 (three) times daily  as needed for dizziness.  . meloxicam (MOBIC) 15 MG tablet Take 15 mg by mouth daily.  . methocarbamol (ROBAXIN) 750 MG tablet Take 1 tablet (750 mg total) by mouth every 6 (six) hours as needed for muscle spasms.  . montelukast (SINGULAIR) 10 MG tablet Take 1 tablet (10 mg total) by mouth at bedtime.  Marland Kitchen OLANZapine (ZYPREXA) 10 MG tablet TAKE 1 TABLET BY MOUTH EVERYDAY AT BEDTIME  . omeprazole (PRILOSEC) 40 MG capsule Take 40 mg by mouth every evening.   Marland Kitchen tiZANidine (ZANAFLEX) 4 MG tablet Take 4 mg by mouth at bedtime.   . traMADol (ULTRAM) 50 MG tablet Take 1-2 tablets (50-100 mg total) by mouth every 6 (six) hours as needed.  . traZODone (DESYREL) 50 MG tablet Take 1-1.5 tablets (50-75 mg total) by mouth at bedtime as needed for sleep. (Patient taking differently: Take 50 mg by mouth at bedtime as needed for sleep. )   Allergies  Allergen Reactions  . Hydrocodone-Acetaminophen Hives    All over her body.  . Lactose Intolerance (Gi) Other (See Comments)    Bloating and GI upset  . Penicillins Rash    Did it involve swelling of the face/tongue/throat, SOB, or low BP? Unknown Did it involve sudden or severe rash/hives, skin peeling, or any reaction on the inside of your mouth or nose? Yes Did you need to seek medical attention at a hospital or doctor's office? Unknown When did it last happen? Childhood reaction. If all above answers are "NO", may proceed with cephalosporin use.   . Sulfa Antibiotics Rash    Full body rash    Recent Results (from the past 2160 hour(s))  Comprehensive metabolic panel     Status: Abnormal   Collection  Time: 04/30/19 11:06 AM  Result Value Ref Range   Sodium 139 135 - 145 mmol/L   Potassium 3.6 3.5 - 5.1 mmol/L   Chloride 105 98 - 111 mmol/L   CO2 25 22 - 32 mmol/L   Glucose, Bld 115 (H) 70 - 99 mg/dL   BUN 19 6 - 20 mg/dL   Creatinine, Ser 0.68 0.44 - 1.00 mg/dL   Calcium 9.0 8.9 - 10.3 mg/dL   Total Protein 6.5 6.5 - 8.1 g/dL   Albumin 3.7 3.5 - 5.0 g/dL   AST 22 15 - 41 U/L   ALT 23 0 - 44 U/L   Alkaline Phosphatase 75 38 - 126 U/L   Total Bilirubin 0.3 0.3 - 1.2 mg/dL   GFR calc non Af Amer >60 >60 mL/min   GFR calc Af Amer >60 >60 mL/min   Anion gap 9 5 - 15    Comment: Performed at [facility], [address] [place], [place] [address]  CBC with Differential     Status: Abnormal   Collection Time: 04/30/19 11:06 AM  Result Value Ref Range   WBC 14.9 (H) 4.0 - 10.5 K/uL   RBC 3.60 (L) 3.87 - 5.11 MIL/uL   Hemoglobin 11.6 (L) 12.0 - 15.0 g/dL   HCT 34.3 (L) 36.0 - 46.0 %   MCV 95.3 80.0 - 100.0 fL   MCH 32.2 26.0 - 34.0 pg   MCHC 33.8 30.0 - 36.0 g/dL   RDW 13.9 11.5 - 15.5 %   Platelets 231 150 - 400 K/uL   nRBC 0.3 (H) 0.0 - 0.2 %   Neutrophils Relative % 68 %   Neutro Abs 10.3 (H) 1.7 - 7.7 K/uL   Lymphocytes Relative 9 %  Lymphs Abs 1.4 0.7 - 4.0 K/uL   Monocytes Relative 10 %   Monocytes Absolute 1.5 (H) 0.1 - 1.0 K/uL   Eosinophils Relative 1 %   Eosinophils Absolute 0.1 0.0 - 0.5 K/uL   Basophils Relative 2 %   Basophils Absolute 0.3 (H) 0.0 - 0.1 K/uL   WBC Morphology NUELASTA ADMIN ON 2.2.2021    Immature Granulocytes 10 %    Comment: Increased IG's, likely caused by Bone Marrow Colony Stimulating Factor received within 30 days.   Abs Immature Granulocytes 1.42 (H) 0.00 - 0.07 K/uL    Comment: Performed at [facility], [address] [place], [place] [address]  Comprehensive metabolic panel     Status: Abnormal   Collection Time: 05/14/19  9:55 AM  Result Value Ref Range   Sodium 138 135 - 145 mmol/L   Potassium 3.6 3.5 - 5.1  mmol/L   Chloride 105 98 - 111 mmol/L   CO2 24 22 - 32 mmol/L   Glucose, Bld 132 (H) 70 - 99 mg/dL    Comment: Glucose reference range applies only to samples taken after fasting for at least 8 hours.   BUN 21 (H) 6 - 20 mg/dL   Creatinine, Ser 0.61 0.44 - 1.00 mg/dL   Calcium 8.8 (L) 8.9 - 10.3 mg/dL   Total Protein 6.4 (L) 6.5 - 8.1 g/dL   Albumin 3.5 3.5 - 5.0 g/dL   AST 21 15 - 41 U/L   ALT 19 0 - 44 U/L   Alkaline Phosphatase 68 38 - 126 U/L   Total Bilirubin 0.6 0.3 - 1.2 mg/dL   GFR calc non Af Amer >60 >60 mL/min   GFR calc Af Amer >60 >60 mL/min   Anion gap 9 5 - 15    Comment: Performed at [facility], [address] [place], [place] [address]  CBC with Differential     Status: Abnormal   Collection Time: 05/14/19  9:55 AM  Result Value Ref Range   WBC 12.5 (H) 4.0 - 10.5 K/uL   RBC 3.70 (L) 3.87 - 5.11 MIL/uL   Hemoglobin 12.0 12.0 - 15.0 g/dL   HCT 35.8 (L) 36.0 - 46.0 %   MCV 96.8 80.0 - 100.0 fL   MCH 32.4 26.0 - 34.0 pg   MCHC 33.5 30.0 - 36.0 g/dL   RDW 15.1 11.5 - 15.5 %   Platelets 218 150 - 400 K/uL   nRBC 0.4 (H) 0.0 - 0.2 %   Neutrophils Relative % 69 %   Neutro Abs 8.6 (H) 1.7 - 7.7 K/uL   Lymphocytes Relative 7 %   Lymphs Abs 0.9 0.7 - 4.0 K/uL   Monocytes Relative 12 %   Monocytes Absolute 1.6 (H) 0.1 - 1.0 K/uL   Eosinophils Relative 0 %   Eosinophils Absolute 0.0 0.0 - 0.5 K/uL   Basophils Relative 2 %   Basophils Absolute 0.2 (H) 0.0 - 0.1 K/uL   WBC Morphology NEULASTA ADMIN ON 2.16.2021    Immature Granulocytes 10 %    Comment: Increased IG's, likely caused by Bone Marrow Colony Stimulating Factor received within 30 days.   Abs Immature Granulocytes 1.28 (H) 0.00 - 0.07 K/uL    Comment: Performed at [facility], [address] [place], [place] [address]  Comprehensive metabolic panel     Status: Abnormal   Collection Time: 05/21/19  8:02 AM  Result Value Ref Range   Sodium 134 (L) 135 - 145 mmol/L  Potassium 3.9 3.5 -  5.1 mmol/L   Chloride 103 98 - 111 mmol/L   CO2 22 22 - 32 mmol/L   Glucose, Bld 111 (H) 70 - 99 mg/dL    Comment: Glucose reference range applies only to samples taken after fasting for at least 8 hours.   BUN 19 6 - 20 mg/dL   Creatinine, Ser 0.51 0.44 - 1.00 mg/dL   Calcium 8.4 (L) 8.9 - 10.3 mg/dL   Total Protein 6.2 (L) 6.5 - 8.1 g/dL   Albumin 3.4 (L) 3.5 - 5.0 g/dL   AST 24 15 - 41 U/L   ALT 26 0 - 44 U/L   Alkaline Phosphatase 49 38 - 126 U/L   Total Bilirubin 0.7 0.3 - 1.2 mg/dL   GFR calc non Af Amer >60 >60 mL/min   GFR calc Af Amer >60 >60 mL/min   Anion gap 9 5 - 15    Comment: Performed at [facility], [address] [place], [place] [address]  CBC with Differential     Status: Abnormal   Collection Time: 05/21/19  8:02 AM  Result Value Ref Range   WBC 8.6 4.0 - 10.5 K/uL   RBC 3.52 (L) 3.87 - 5.11 MIL/uL   Hemoglobin 11.5 (L) 12.0 - 15.0 g/dL   HCT 34.3 (L) 36.0 - 46.0 %   MCV 97.4 80.0 - 100.0 fL   MCH 32.7 26.0 - 34.0 pg   MCHC 33.5 30.0 - 36.0 g/dL   RDW 14.6 11.5 - 15.5 %   Platelets 344 150 - 400 K/uL   nRBC 0.0 0.0 - 0.2 %   Neutrophils Relative % 73 %   Neutro Abs 6.4 1.7 - 7.7 K/uL   Lymphocytes Relative 8 %   Lymphs Abs 0.7 0.7 - 4.0 K/uL   Monocytes Relative 14 %   Monocytes Absolute 1.2 (H) 0.1 - 1.0 K/uL   Eosinophils Relative 1 %   Eosinophils Absolute 0.1 0.0 - 0.5 K/uL   Basophils Relative 2 %   Basophils Absolute 0.1 0.0 - 0.1 K/uL   Immature Granulocytes 2 %   Abs Immature Granulocytes 0.14 (H) 0.00 - 0.07 K/uL    Comment: Performed at [facility], [address] [place], [place] [address]  Comprehensive metabolic panel     Status: Abnormal   Collection Time: 05/28/19  9:43 AM  Result Value Ref Range   Sodium 138 135 - 145 mmol/L   Potassium 4.0 3.5 - 5.1 mmol/L   Chloride 105 98 - 111 mmol/L   CO2 24 22 - 32 mmol/L   Glucose, Bld 117 (H) 70 - 99 mg/dL    Comment: Glucose reference range applies only to samples  taken after fasting for at least 8 hours.   BUN 24 (H) 6 - 20 mg/dL   Creatinine, Ser 0.70 0.44 - 1.00 mg/dL   Calcium 8.7 (L) 8.9 - 10.3 mg/dL   Total Protein 6.2 (L) 6.5 - 8.1 g/dL   Albumin 3.4 (L) 3.5 - 5.0 g/dL   AST 27 15 - 41 U/L   ALT 30 0 - 44 U/L   Alkaline Phosphatase 47 38 - 126 U/L   Total Bilirubin 0.7 0.3 - 1.2 mg/dL   GFR calc non Af Amer >60 >60 mL/min   GFR calc Af Amer >60 >60 mL/min   Anion gap 9 5 - 15    Comment: Performed at [facility], [address] [place], [place] [address]  CBC  with Differential     Status: Abnormal   Collection Time: 05/28/19  9:43 AM  Result Value Ref Range   WBC 7.7 4.0 - 10.5 K/uL   RBC 3.77 (L) 3.87 - 5.11 MIL/uL   Hemoglobin 12.4 12.0 - 15.0 g/dL   HCT 35.5 (L) 36.0 - 46.0 %   MCV 94.2 80.0 - 100.0 fL   MCH 32.9 26.0 - 34.0 pg   MCHC 34.9 30.0 - 36.0 g/dL   RDW 14.6 11.5 - 15.5 %   Platelets 343 150 - 400 K/uL   nRBC 0.0 0.0 - 0.2 %   Neutrophils Relative % 65 %   Neutro Abs 5.0 1.7 - 7.7 K/uL   Lymphocytes Relative 11 %   Lymphs Abs 0.8 0.7 - 4.0 K/uL   Monocytes Relative 15 %   Monocytes Absolute 1.2 (H) 0.1 - 1.0 K/uL   Eosinophils Relative 5 %   Eosinophils Absolute 0.4 0.0 - 0.5 K/uL   Basophils Relative 1 %   Basophils Absolute 0.1 0.0 - 0.1 K/uL   Immature Granulocytes 3 %   Abs Immature Granulocytes 0.21 (H) 0.00 - 0.07 K/uL    Comment: Performed at [facility], [address] [place], [place] [address]  Comprehensive metabolic panel     Status: Abnormal   Collection Time: 06/04/19  8:08 AM  Result Value Ref Range   Sodium 135 135 - 145 mmol/L   Potassium 3.4 (L) 3.5 - 5.1 mmol/L   Chloride 105 98 - 111 mmol/L   CO2 22 22 - 32 mmol/L   Glucose, Bld 117 (H) 70 - 99 mg/dL    Comment: Glucose reference range applies only to samples taken after fasting for at least 8 hours.   BUN 21 (H) 6 - 20 mg/dL   Creatinine, Ser 0.65 0.44 - 1.00 mg/dL   Calcium 8.1 (L) 8.9 - 10.3 mg/dL   Total Protein  5.6 (L) 6.5 - 8.1 g/dL   Albumin 3.2 (L) 3.5 - 5.0 g/dL   AST 22 15 - 41 U/L   ALT 20 0 - 44 U/L   Alkaline Phosphatase 44 38 - 126 U/L   Total Bilirubin 0.7 0.3 - 1.2 mg/dL   GFR calc non Af Amer >60 >60 mL/min   GFR calc Af Amer >60 >60 mL/min   Anion gap 8 5 - 15    Comment: Performed at [facility], [address] [place], [place] [address]  CBC with Differential     Status: Abnormal   Collection Time: 06/04/19  8:08 AM  Result Value Ref Range   WBC 9.4 4.0 - 10.5 K/uL   RBC 3.57 (L) 3.87 - 5.11 MIL/uL   Hemoglobin 11.7 (L) 12.0 - 15.0 g/dL   HCT 33.4 (L) 36.0 - 46.0 %   MCV 93.6 80.0 - 100.0 fL   MCH 32.8 26.0 - 34.0 pg   MCHC 35.0 30.0 - 36.0 g/dL   RDW 14.3 11.5 - 15.5 %   Platelets 289 150 - 400 K/uL   nRBC 0.0 0.0 - 0.2 %   Neutrophils Relative % 72 %   Neutro Abs 6.7 1.7 - 7.7 K/uL   Lymphocytes Relative 9 %   Lymphs Abs 0.9 0.7 - 4.0 K/uL   Monocytes Relative 10 %   Monocytes Absolute 1.0 0.1 - 1.0 K/uL   Eosinophils Relative 4 %   Eosinophils Absolute 0.4 0.0 - 0.5 K/uL   Basophils Relative 1 %   Basophils Absolute 0.1  0.0 - 0.1 K/uL   Immature Granulocytes 4 %   Abs Immature Granulocytes 0.33 (H) 0.00 - 0.07 K/uL    Comment: Performed at [facility], [address] [place], [place] [address]  Comprehensive metabolic panel     Status: Abnormal   Collection Time: 06/11/19  9:03 AM  Result Value Ref Range   Sodium 135 135 - 145 mmol/L    Comment: POST-ULTRACENTRIFUGATION   Potassium 3.8 3.5 - 5.1 mmol/L   Chloride 102 98 - 111 mmol/L   CO2 24 22 - 32 mmol/L   Glucose, Bld 103 (H) 70 - 99 mg/dL    Comment: Glucose reference range applies only to samples taken after fasting for at least 8 hours.   BUN 18 6 - 20 mg/dL   Creatinine, Ser 0.53 0.44 - 1.00 mg/dL   Calcium 8.7 (L) 8.9 - 10.3 mg/dL   Total Protein 5.9 (L) 6.5 - 8.1 g/dL    Comment: POST-ULTRACENTRIFUGATION   Albumin 3.2 (L) 3.5 - 5.0 g/dL   AST 23 15 - 41 U/L   ALT 22 0 - 44 U/L    Alkaline Phosphatase 49 38 - 126 U/L   Total Bilirubin 0.8 0.3 - 1.2 mg/dL   GFR calc non Af Amer >60 >60 mL/min   GFR calc Af Amer >60 >60 mL/min   Anion gap 9 5 - 15    Comment: Performed at [facility], [address] [place], [place] [address]  CBC with Differential     Status: Abnormal   Collection Time: 06/11/19  9:03 AM  Result Value Ref Range   WBC 9.2 4.0 - 10.5 K/uL   RBC 3.56 (L) 3.87 - 5.11 MIL/uL   Hemoglobin 11.9 (L) 12.0 - 15.0 g/dL   HCT 33.3 (L) 36.0 - 46.0 %   MCV 93.5 80.0 - 100.0 fL   MCH 33.4 26.0 - 34.0 pg   MCHC 35.7 30.0 - 36.0 g/dL   RDW 14.2 11.5 - 15.5 %   Platelets 318 150 - 400 K/uL   nRBC 0.0 0.0 - 0.2 %   Neutrophils Relative % 75 %   Neutro Abs 7.0 1.7 - 7.7 K/uL   Lymphocytes Relative 7 %   Lymphs Abs 0.7 0.7 - 4.0 K/uL   Monocytes Relative 10 %   Monocytes Absolute 0.9 0.1 - 1.0 K/uL   Eosinophils Relative 3 %   Eosinophils Absolute 0.2 0.0 - 0.5 K/uL   Basophils Relative 1 %   Basophils Absolute 0.1 0.0 - 0.1 K/uL   Immature Granulocytes 4 %   Abs Immature Granulocytes 0.34 (H) 0.00 - 0.07 K/uL    Comment: Performed at [facility], [address] [place], [place] [address]  Comprehensive metabolic panel     Status: Abnormal   Collection Time: 06/18/19  9:08 AM  Result Value Ref Range   Sodium 138 135 - 145 mmol/L   Potassium 3.8 3.5 - 5.1 mmol/L   Chloride 104 98 - 111 mmol/L   CO2 25 22 - 32 mmol/L   Glucose, Bld 95 70 - 99 mg/dL    Comment: Glucose reference range applies only to samples taken after fasting for at least 8 hours.   BUN 31 (H) 6 - 20 mg/dL   Creatinine, Ser 0.64 0.44 - 1.00 mg/dL   Calcium 8.8 (L) 8.9 - 10.3 mg/dL   Total Protein 6.3 (L) 6.5 - 8.1 g/dL   Albumin 3.3 (L) 3.5 - 5.0 g/dL  AST 16 15 - 41 U/L   ALT 26 0 - 44 U/L   Alkaline Phosphatase 45 38 - 126 U/L   Total Bilirubin 0.9 0.3 - 1.2 mg/dL   GFR calc non Af Amer >60 >60 mL/min   GFR calc Af Amer >60 >60 mL/min   Anion gap 9 5 -  15    Comment: Performed at [facility], [address] [place], [place] [address]  CBC with Differential/Platelet     Status: Abnormal   Collection Time: 06/18/19  9:08 AM  Result Value Ref Range   WBC 8.4 4.0 - 10.5 K/uL   RBC 3.65 (L) 3.87 - 5.11 MIL/uL   Hemoglobin 12.0 12.0 - 15.0 g/dL   HCT 34.9 (L) 36.0 - 46.0 %   MCV 95.6 80.0 - 100.0 fL   MCH 32.9 26.0 - 34.0 pg   MCHC 34.4 30.0 - 36.0 g/dL   RDW 14.5 11.5 - 15.5 %   Platelets 278 150 - 400 K/uL   nRBC 0.0 0.0 - 0.2 %   Neutrophils Relative % 64 %   Neutro Abs 5.4 1.7 - 7.7 K/uL   Lymphocytes Relative 15 %   Lymphs Abs 1.2 0.7 - 4.0 K/uL   Monocytes Relative 11 %   Monocytes Absolute 0.9 0.1 - 1.0 K/uL   Eosinophils Relative 4 %   Eosinophils Absolute 0.3 0.0 - 0.5 K/uL   Basophils Relative 1 %   Basophils Absolute 0.1 0.0 - 0.1 K/uL   Immature Granulocytes 5 %   Abs Immature Granulocytes 0.45 (H) 0.00 - 0.07 K/uL    Comment: Performed at [facility], [address] [place], [place] [address]  Comprehensive metabolic panel     Status: Abnormal   Collection Time: 06/25/19  9:40 AM  Result Value Ref Range   Sodium 137 135 - 145 mmol/L   Potassium 3.6 3.5 - 5.1 mmol/L   Chloride 102 98 - 111 mmol/L   CO2 25 22 - 32 mmol/L   Glucose, Bld 129 (H) 70 - 99 mg/dL    Comment: Glucose reference range applies only to samples taken after fasting for at least 8 hours.   BUN 17 6 - 20 mg/dL   Creatinine, Ser 0.74 0.44 - 1.00 mg/dL   Calcium 8.4 (L) 8.9 - 10.3 mg/dL   Total Protein 5.9 (L) 6.5 - 8.1 g/dL   Albumin 3.2 (L) 3.5 - 5.0 g/dL   AST 23 15 - 41 U/L   ALT 27 0 - 44 U/L   Alkaline Phosphatase 50 38 - 126 U/L   Total Bilirubin 0.6 0.3 - 1.2 mg/dL   GFR calc non Af Amer >60 >60 mL/min   GFR calc Af Amer >60 >60 mL/min   Anion gap 10 5 - 15    Comment: Performed at [facility], [address] [place], [place] [address]  CBC with Differential     Status: Abnormal   Collection Time: 06/25/19   9:40 AM  Result Value Ref Range   WBC 6.6 4.0 - 10.5 K/uL   RBC 3.46 (L) 3.87 - 5.11 MIL/uL   Hemoglobin 11.6 (L) 12.0 - 15.0 g/dL   HCT 32.8 (L) 36.0 - 46.0 %   MCV 94.8 80.0 - 100.0 fL   MCH 33.5 26.0 - 34.0 pg   MCHC 35.4 30.0 - 36.0 g/dL   RDW 14.5 11.5 - 15.5 %   Platelets 251 150 - 400 K/uL   nRBC 0.0 0.0 -  0.2 %   Neutrophils Relative % 71 %   Neutro Abs 4.7 1.7 - 7.7 K/uL   Lymphocytes Relative 10 %   Lymphs Abs 0.6 (L) 0.7 - 4.0 K/uL   Monocytes Relative 12 %   Monocytes Absolute 0.8 0.1 - 1.0 K/uL   Eosinophils Relative 2 %   Eosinophils Absolute 0.2 0.0 - 0.5 K/uL   Basophils Relative 1 %   Basophils Absolute 0.1 0.0 - 0.1 K/uL   Immature Granulocytes 4 %   Abs Immature Granulocytes 0.24 (H) 0.00 - 0.07 K/uL    Comment: Performed at [facility], [address] [place], [place] [address]  Comprehensive metabolic panel     Status: Abnormal   Collection Time: 07/02/19  9:13 AM  Result Value Ref Range   Sodium 137 135 - 145 mmol/L   Potassium 3.9 3.5 - 5.1 mmol/L   Chloride 103 98 - 111 mmol/L   CO2 26 22 - 32 mmol/L   Glucose, Bld 123 (H) 70 - 99 mg/dL    Comment: Glucose reference range applies only to samples taken after fasting for at least 8 hours.   BUN 22 (H) 6 - 20 mg/dL   Creatinine, Ser 0.67 0.44 - 1.00 mg/dL   Calcium 8.5 (L) 8.9 - 10.3 mg/dL   Total Protein 6.0 (L) 6.5 - 8.1 g/dL   Albumin 3.2 (L) 3.5 - 5.0 g/dL   AST 23 15 - 41 U/L   ALT 27 0 - 44 U/L   Alkaline Phosphatase 49 38 - 126 U/L   Total Bilirubin 0.7 0.3 - 1.2 mg/dL   GFR calc non Af Amer >60 >60 mL/min   GFR calc Af Amer >60 >60 mL/min   Anion gap 8 5 - 15    Comment: Performed at [facility], [address] [place], [place] [address]  CBC with Differential     Status: Abnormal   Collection Time: 07/02/19  9:13 AM  Result Value Ref Range   WBC 8.7 4.0 - 10.5 K/uL   RBC 3.57 (L) 3.87 - 5.11 MIL/uL   Hemoglobin 11.9 (L) 12.0 - 15.0 g/dL   HCT 33.7 (L) 36.0 - 46.0 %    MCV 94.4 80.0 - 100.0 fL   MCH 33.3 26.0 - 34.0 pg   MCHC 35.3 30.0 - 36.0 g/dL   RDW 13.7 11.5 - 15.5 %   Platelets 268 150 - 400 K/uL   nRBC 0.0 0.0 - 0.2 %   Neutrophils Relative % 80 %   Neutro Abs 6.9 1.7 - 7.7 K/uL   Lymphocytes Relative 7 %   Lymphs Abs 0.6 (L) 0.7 - 4.0 K/uL   Monocytes Relative 8 %   Monocytes Absolute 0.7 0.1 - 1.0 K/uL   Eosinophils Relative 1 %   Eosinophils Absolute 0.1 0.0 - 0.5 K/uL   Basophils Relative 1 %   Basophils Absolute 0.1 0.0 - 0.1 K/uL   Immature Granulocytes 3 %   Abs Immature Granulocytes 0.22 (H) 0.00 - 0.07 K/uL    Comment: Performed at [facility], [address] [place],  [address]  Comprehensive metabolic panel     Status: Abnormal   Collection Time: 07/09/19  9:05 AM  Result Value Ref Range   Sodium 137 135 - 145 mmol/L   Potassium 3.7 3.5 - 5.1 mmol/L   Chloride 104 98 - 111 mmol/L   CO2 24 22 - 32 mmol/L   Glucose, Bld 110 (H) 70 - 99  mg/dL    Comment: Glucose reference range applies only to samples taken after fasting for at least 8 hours.   BUN 20 6 - 20 mg/dL   Creatinine, Ser 0.71 0.44 - 1.00 mg/dL   Calcium 8.6 (L) 8.9 - 10.3 mg/dL   Total Protein 6.1 (L) 6.5 - 8.1 g/dL   Albumin 3.1 (L) 3.5 - 5.0 g/dL   AST 22 15 - 41 U/L   ALT 26 0 - 44 U/L   Alkaline Phosphatase 48 38 - 126 U/L   Total Bilirubin 0.8 0.3 - 1.2 mg/dL   GFR calc non Af Amer >60 >60 mL/min   GFR calc Af Amer >60 >60 mL/min   Anion gap 9 5 - 15    Comment: Performed at [facility], [address] [place], [place] [address]  CBC with Differential/Platelet     Status: Abnormal   Collection Time: 07/09/19  9:05 AM  Result Value Ref Range   WBC 5.9 4.0 - 10.5 K/uL   RBC 3.59 (L) 3.87 - 5.11 MIL/uL   Hemoglobin 11.7 (L) 12.0 - 15.0 g/dL   HCT 33.8 (L) 36.0 - 46.0 %   MCV 94.2 80.0 - 100.0 fL   MCH 32.6 26.0 - 34.0 pg   MCHC 34.6 30.0 - 36.0 g/dL   RDW 13.7 11.5 - 15.5 %   Platelets 249 150 - 400 K/uL   nRBC 0.0 0.0 -  0.2 %   Neutrophils Relative % 63 %   Neutro Abs 3.7 1.7 - 7.7 K/uL   Lymphocytes Relative 16 %   Lymphs Abs 1.0 0.7 - 4.0 K/uL   Monocytes Relative 13 %   Monocytes Absolute 0.8 0.1 - 1.0 K/uL   Eosinophils Relative 3 %   Eosinophils Absolute 0.2 0.0 - 0.5 K/uL   Basophils Relative 1 %   Basophils Absolute 0.1 0.0 - 0.1 K/uL   Immature Granulocytes 4 %   Abs Immature Granulocytes 0.23 (H) 0.00 - 0.07 K/uL    Comment: Performed at [facility], [address] [place], [place] [address]  Comprehensive metabolic panel     Status: Abnormal   Collection Time: 07/16/19  9:26 AM  Result Value Ref Range   Sodium 137 135 - 145 mmol/L   Potassium 3.5 3.5 - 5.1 mmol/L   Chloride 101 98 - 111 mmol/L   CO2 26 22 - 32 mmol/L   Glucose, Bld 103 (H) 70 - 99 mg/dL    Comment: Glucose reference range applies only to samples taken after fasting for at least 8 hours.   BUN 19 6 - 20 mg/dL   Creatinine, Ser 0.59 0.44 - 1.00 mg/dL   Calcium 8.3 (L) 8.9 - 10.3 mg/dL   Total Protein 5.7 (L) 6.5 - 8.1 g/dL   Albumin 3.2 (L) 3.5 - 5.0 g/dL   AST 23 15 - 41 U/L   ALT 25 0 - 44 U/L   Alkaline Phosphatase 50 38 - 126 U/L   Total Bilirubin 0.8 0.3 - 1.2 mg/dL   GFR calc non Af Amer >60 >60 mL/min   GFR calc Af Amer >60 >60 mL/min   Anion gap 10 5 - 15    Comment: Performed at [facility], [address] [place], [place] [address]  CBC with Differential     Status: Abnormal   Collection Time: 07/16/19  9:26 AM  Result Value Ref Range   WBC 6.5 4.0 - 10.5 K/uL   RBC 3.62 (L)  3.87 - 5.11 MIL/uL   Hemoglobin 11.9 (L) 12.0 - 15.0 g/dL   HCT 34.1 (L) 36.0 - 46.0 %   MCV 94.2 80.0 - 100.0 fL   MCH 32.9 26.0 - 34.0 pg   MCHC 34.9 30.0 - 36.0 g/dL   RDW 13.4 11.5 - 15.5 %   Platelets 269 150 - 400 K/uL   nRBC 0.3 (H) 0.0 - 0.2 %   Neutrophils Relative % 65 %   Neutro Abs 4.3 1.7 - 7.7 K/uL   Lymphocytes Relative 15 %   Lymphs Abs 1.0 0.7 - 4.0 K/uL   Monocytes Relative 11 %    Monocytes Absolute 0.7 0.1 - 1.0 K/uL   Eosinophils Relative 2 %   Eosinophils Absolute 0.1 0.0 - 0.5 K/uL   Basophils Relative 1 %   Basophils Absolute 0.1 0.0 - 0.1 K/uL   WBC Morphology MILD LEFT SHIFT (1-5% METAS, OCC MYELO, OCC BANDS)    Immature Granulocytes 6 %   Abs Immature Granulocytes 0.38 (H) 0.00 - 0.07 K/uL    Comment: Performed at [facility], [address] [place], [place] [address]   Objective  Body mass index is 33.94 kg/m. Wt Readings from Last 3 Encounters:  07/19/19 191 lb 9.6 oz (86.9 kg)  07/16/19 191 lb 6 oz (86.8 kg)  07/09/19 190 lb 6.4 oz (86.4 kg)   Temp Readings from Last 3 Encounters:  07/19/19 98.6 F (37 C) (Oral)  07/16/19 97.7 F (36.5 C) (Tympanic)  07/09/19 (!) 96.4 F (35.8 C)   BP Readings from Last 3 Encounters:  07/19/19 (!) 118/99  07/16/19 129/68  07/09/19 128/77   Pulse Readings from Last 3 Encounters:  07/19/19 (!) 105  07/16/19 81  07/09/19 72    Physical Exam Vitals and nursing note reviewed.  Constitutional:      Appearance: Normal appearance. She is well-developed and well-groomed. She is obese.  HENT:     Head: Normocephalic and atraumatic.  Eyes:     Conjunctiva/sclera: Conjunctivae normal.     Pupils: Pupils are equal, round, and reactive to light.  Cardiovascular:     Rate and Rhythm: Normal rate and regular rhythm.     Heart sounds: Normal heart sounds. No murmur.  Pulmonary:     Effort: Pulmonary effort is normal.     Breath sounds: Normal breath sounds.  Skin:    General: Skin is warm and dry.  Neurological:     General: No focal deficit present.     Mental Status: She is alert and oriented to person, place, and time. Mental status is at baseline.     Gait: Gait normal.  Psychiatric:        Attention and Perception: Attention and perception normal.        Mood and Affect: Mood and affect normal.        Speech: Speech normal.        Behavior: Behavior normal. Behavior is cooperative.         Thought Content: Thought content normal.        Cognition and Memory: Cognition and memory normal.        Judgment: Judgment normal.     Assessment  Plan  Malignant neoplasm of left female breast, unspecified estrogen receptor status, unspecified site of breast ([place]) F/u Dr. Janese Banks Dr. Noreene Filbert to have radiation pending last 2 chemo sessions  Disc alpha lipoic acid for neuropathy sx's disc with Dr. Janese Banks  Hyperlipidemia, unspecified hyperlipidemia type -  Plan: Lipid panel rec healthy diet and exercise   B12 deficiency - Plan: B12  Vitamin D deficiency - Plan: Vitamin D (25 hydroxy)   Hyperglycemia - Plan: Hemoglobin A1c  Anxiety and depression  On cymbalta 30 qd daily  F/u osman appt 07/25/19 seeing monthly   HM Had flu shot had 03/01/2019  Tdap12/31/19 rec hep B vaccinewill call back to sch rec MMR vaccine covid 19 vaccine not had had covid 19 +   EGD/colonoscopy had 03/27/17 Dr. Bailey Mech diverticulosis, serrated polyps neg EGD for eosinophillic esophagitis repeat colonoscopy due in 5years -refilled PPI   rec D3 5000 IU daily  mammo 10/10/17 negativeFH breast cancer in mom and m gm in 15s.consider genetic testing in future' \mammogramhad'  10/12/18 needs left mammo and UShad 12/18/18 abnormal + breast cancer/left   Pap1/22/19 h/o abnormal neg pap neg HPV Former smoker congratulated on quitting  NS saw 01/23/18 cervical spinal cord kyphosis, cervical myelopathy f/u in 4 months referral to pm&R for pain tx Dr. Remo Lipps cook   MRI 10/10/18 abnormal neurology does not think MS rec [facility]  [facility] seen 01/04/19 stage 1 left breast cancer ER+ Dr. Marlou Starks breast conservation surgery and sentinel node bx f/u with h/o after surgery  CC Dr. Manuella Ghazi to see when due to f/u neurology    Provider: Dr. Olivia Mackie McLean-[facility]

## 2019-07-19 NOTE — Patient Instructions (Addendum)
Pirq shakes (target)  Non dairy protein    Alpha lipoic acid over the counter supplement for neuropathy 600 mg 2x per day     Neuropathic Pain Neuropathic pain is pain caused by damage to the nerves that are responsible for certain sensations in your body (sensory nerves). The pain can be caused by:  Damage to the sensory nerves that send signals to your spinal cord and brain (peripheral nervous system).  Damage to the sensory nerves in your brain or spinal cord (central nervous system). Neuropathic pain can make you more sensitive to pain. Even a minor sensation can feel very painful. This is usually a long-term condition that can be difficult to treat. The type of pain differs from person to person. It may:  Start suddenly (acute), or it may develop slowly and last for a long time (chronic).  Come and go as damaged nerves heal, or it may stay at the same level for years.  Cause emotional distress, loss of sleep, and a lower quality of life. What are the causes? The most common cause of this condition is diabetes. Many other diseases and conditions can also cause neuropathic pain. Causes of neuropathic pain can be classified as:  Toxic. This is caused by medicines and chemicals. The most common cause of toxic neuropathic pain is damage from cancer treatments (chemotherapy).  Metabolic. This can be caused by: ? Diabetes. This is the most common disease that damages the nerves. ? Lack of vitamin B from long-term alcohol abuse.  Traumatic. Any injury that cuts, crushes, or stretches a nerve can cause damage and pain. A common example is feeling pain after losing an arm or leg (phantom limb pain).  Compression-related. If a sensory nerve gets trapped or compressed for a long period of time, the blood supply to the nerve can be cut off.  Vascular. Many blood vessel diseases can cause neuropathic pain by decreasing blood supply and oxygen to nerves.  Autoimmune. This type of pain  results from diseases in which the body's defense system (immune system) mistakenly attacks sensory nerves. Examples of autoimmune diseases that can cause neuropathic pain include lupus and multiple sclerosis.  Infectious. Many types of viral infections can damage sensory nerves and cause pain. Shingles infection is a common cause of this type of pain.  Inherited. Neuropathic pain can be a symptom of many diseases that are passed down through families (genetic). What increases the risk? You are more likely to develop this condition if:  You have diabetes.  You smoke.  You drink too much alcohol.  You are taking certain medicines, including medicines that kill cancer cells (chemotherapy) or that treat immune system disorders. What are the signs or symptoms? The main symptom is pain. Neuropathic pain is often described as:  Burning.  Shock-like.  Stinging.  Hot or cold.  Itching. How is this diagnosed? No single test can diagnose neuropathic pain. It is diagnosed based on:  Physical exam and your symptoms. Your health care provider will ask you about your pain. You may be asked to use a pain scale to describe how bad your pain is.  Tests. These may be done to see if you have a high sensitivity to pain and to help find the cause and location of any sensory nerve damage. They include: ? Nerve conduction studies to test how well nerve signals travel through your sensory nerves (electrodiagnostic testing). ? Stimulating your sensory nerves through electrodes on your skin and measuring the response in your spinal  cord and brain (somatosensory evoked potential).  Imaging studies, such as: ? X-rays. ? CT scan. ? MRI. How is this treated? Treatment for neuropathic pain may change over time. You may need to try different treatment options or a combination of treatments. Some options include:  Treating the underlying cause of the neuropathy, such as diabetes, kidney disease, or vitamin  deficiencies.  Stopping medicines that can cause neuropathy, such as chemotherapy.  Medicine to relieve pain. Medicines may include: ? Prescription or over-the-counter pain medicine. ? Anti-seizure medicine. ? Antidepressant medicines. ? Pain-relieving patches that are applied to painful areas of skin. ? A medicine to numb the area (local anesthetic), which can be injected as a nerve block.  Transcutaneous nerve stimulation. This uses electrical currents to block painful nerve signals. The treatment is painless.  Alternative treatments, such as: ? Acupuncture. ? Meditation. ? Massage. ? Physical therapy. ? Pain management programs. ? Counseling. Follow these instructions at home: Medicines   Take over-the-counter and prescription medicines only as told by your health care provider.  Do not drive or use heavy machinery while taking prescription pain medicine.  If you are taking prescription pain medicine, take actions to prevent or treat constipation. Your health care provider may recommend that you: ? Drink enough fluid to keep your urine pale yellow. ? Eat foods that are high in fiber, such as fresh fruits and vegetables, whole grains, and beans. ? Limit foods that are high in fat and processed sugars, such as fried or sweet foods. ? Take an over-the-counter or prescription medicine for constipation. Lifestyle   Have a good support system at home.  Consider joining a chronic pain support group.  Do not use any products that contain nicotine or tobacco, such as cigarettes and e-cigarettes. If you need help quitting, ask your health care provider.  Do not drink alcohol. General instructions  Learn as much as you can about your condition.  Work closely with all your health care providers to find the treatment plan that works best for you.  Ask your health care provider what activities are safe for you.  Keep all follow-up visits as told by your health care provider.  This is important. Contact a health care provider if:  Your pain treatments are not working.  You are having side effects from your medicines.  You are struggling with tiredness (fatigue), mood changes, depression, or anxiety. Summary  Neuropathic pain is pain caused by damage to the nerves that are responsible for certain sensations in your body (sensory nerves).  Neuropathic pain may come and go as damaged nerves heal, or it may stay at the same level for years.  Neuropathic pain is usually a long-term condition that can be difficult to treat. Consider joining a chronic pain support group. This information is not intended to replace advice given to you by your health care provider. Make sure you discuss any questions you have with your health care provider. Document Revised: 06/21/2018 Document Reviewed: 03/17/2017 Else[employer] Patient Education  [place].

## 2019-07-19 NOTE — Addendum Note (Signed)
Addended by: Leeanne Rio on: 07/19/2019 02:09 PM   Modules accepted: Orders

## 2019-07-23 ENCOUNTER — Inpatient Hospital Stay (HOSPITAL_BASED_OUTPATIENT_CLINIC_OR_DEPARTMENT_OTHER): Payer: Medicaid Other | Admitting: Oncology

## 2019-07-23 ENCOUNTER — Encounter: Payer: Self-pay | Admitting: Oncology

## 2019-07-23 ENCOUNTER — Inpatient Hospital Stay: Payer: Medicaid Other

## 2019-07-23 ENCOUNTER — Other Ambulatory Visit: Payer: Self-pay

## 2019-07-23 VITALS — BP 124/81 | HR 83 | Temp 95.2°F | Resp 16 | Wt 190.8 lb

## 2019-07-23 DIAGNOSIS — Z170 Estrogen receptor positive status [ER+]: Secondary | ICD-10-CM

## 2019-07-23 DIAGNOSIS — Z7189 Other specified counseling: Secondary | ICD-10-CM

## 2019-07-23 DIAGNOSIS — C50212 Malignant neoplasm of upper-inner quadrant of left female breast: Secondary | ICD-10-CM

## 2019-07-23 DIAGNOSIS — T451X5A Adverse effect of antineoplastic and immunosuppressive drugs, initial encounter: Secondary | ICD-10-CM

## 2019-07-23 DIAGNOSIS — Z5111 Encounter for antineoplastic chemotherapy: Secondary | ICD-10-CM | POA: Diagnosis not present

## 2019-07-23 DIAGNOSIS — T451X5D Adverse effect of antineoplastic and immunosuppressive drugs, subsequent encounter: Secondary | ICD-10-CM

## 2019-07-23 DIAGNOSIS — Z95828 Presence of other vascular implants and grafts: Secondary | ICD-10-CM

## 2019-07-23 DIAGNOSIS — G620 Drug-induced polyneuropathy: Secondary | ICD-10-CM

## 2019-07-23 LAB — CBC WITH DIFFERENTIAL/PLATELET
Abs Immature Granulocytes: 0.15 10*3/uL — ABNORMAL HIGH (ref 0.00–0.07)
Basophils Absolute: 0.1 10*3/uL (ref 0.0–0.1)
Basophils Relative: 1 %
Eosinophils Absolute: 0.2 10*3/uL (ref 0.0–0.5)
Eosinophils Relative: 3 %
HCT: 33.5 % — ABNORMAL LOW (ref 36.0–46.0)
Hemoglobin: 11.8 g/dL — ABNORMAL LOW (ref 12.0–15.0)
Immature Granulocytes: 3 %
Lymphocytes Relative: 26 %
Lymphs Abs: 1.5 10*3/uL (ref 0.7–4.0)
MCH: 32.9 pg (ref 26.0–34.0)
MCHC: 35.2 g/dL (ref 30.0–36.0)
MCV: 93.3 fL (ref 80.0–100.0)
Monocytes Absolute: 0.8 10*3/uL (ref 0.1–1.0)
Monocytes Relative: 14 %
Neutro Abs: 3 10*3/uL (ref 1.7–7.7)
Neutrophils Relative %: 53 %
Platelets: 274 10*3/uL (ref 150–400)
RBC: 3.59 MIL/uL — ABNORMAL LOW (ref 3.87–5.11)
RDW: 13.7 % (ref 11.5–15.5)
WBC: 5.7 10*3/uL (ref 4.0–10.5)
nRBC: 0 % (ref 0.0–0.2)

## 2019-07-23 LAB — COMPREHENSIVE METABOLIC PANEL
ALT: 38 U/L (ref 0–44)
AST: 27 U/L (ref 15–41)
Albumin: 3.5 g/dL (ref 3.5–5.0)
Alkaline Phosphatase: 49 U/L (ref 38–126)
Anion gap: 8 (ref 5–15)
BUN: 18 mg/dL (ref 6–20)
CO2: 24 mmol/L (ref 22–32)
Calcium: 8.6 mg/dL — ABNORMAL LOW (ref 8.9–10.3)
Chloride: 105 mmol/L (ref 98–111)
Creatinine, Ser: 0.71 mg/dL (ref 0.44–1.00)
GFR calc Af Amer: >60 mL/min (ref >60)
GFR calc non Af Amer: >60 mL/min (ref >60)
Glucose, Bld: 126 mg/dL — ABNORMAL HIGH (ref 70–99)
Potassium: 3.9 mmol/L (ref 3.5–5.1)
Sodium: 137 mmol/L (ref 135–145)
Total Bilirubin: 0.9 mg/dL (ref 0.3–1.2)
Total Protein: 6.3 g/dL — ABNORMAL LOW (ref 6.5–8.1)

## 2019-07-23 MED ORDER — DIPHENHYDRAMINE HCL 50 MG/ML IJ SOLN
50.0000 mg | Freq: Once | INTRAMUSCULAR | Status: AC
  Administered 2019-07-23: 50 mg via INTRAVENOUS
  Filled 2019-07-23: qty 1

## 2019-07-23 MED ORDER — HEPARIN SOD (PORK) LOCK FLUSH 100 UNIT/ML IV SOLN
INTRAVENOUS | Status: AC
  Filled 2019-07-23: qty 5

## 2019-07-23 MED ORDER — SODIUM CHLORIDE 0.9% FLUSH
10.0000 mL | INTRAVENOUS | Status: DC | PRN
  Administered 2019-07-23: 10 mL via INTRAVENOUS
  Filled 2019-07-23: qty 10

## 2019-07-23 MED ORDER — HEPARIN SOD (PORK) LOCK FLUSH 100 UNIT/ML IV SOLN
500.0000 [IU] | Freq: Once | INTRAVENOUS | Status: AC | PRN
  Administered 2019-07-23: 500 [IU]
  Filled 2019-07-23: qty 5

## 2019-07-23 MED ORDER — SODIUM CHLORIDE 0.9 % IV SOLN
20.0000 mg | Freq: Once | INTRAVENOUS | Status: AC
  Administered 2019-07-23: 20 mg via INTRAVENOUS
  Filled 2019-07-23: qty 20

## 2019-07-23 MED ORDER — FAMOTIDINE IN NACL 20-0.9 MG/50ML-% IV SOLN
20.0000 mg | Freq: Once | INTRAVENOUS | Status: AC
  Administered 2019-07-23: 20 mg via INTRAVENOUS
  Filled 2019-07-23: qty 50

## 2019-07-23 MED ORDER — SODIUM CHLORIDE 0.9 % IV SOLN
65.0000 mg/m2 | Freq: Once | INTRAVENOUS | Status: AC
  Administered 2019-07-23: 126 mg via INTRAVENOUS
  Filled 2019-07-23: qty 21

## 2019-07-23 MED ORDER — LETROZOLE 2.5 MG PO TABS
2.5000 mg | ORAL_TABLET | Freq: Every day | ORAL | 2 refills | Status: DC
Start: 2019-07-23 — End: 2019-11-25

## 2019-07-23 MED ORDER — SODIUM CHLORIDE 0.9 % IV SOLN
Freq: Once | INTRAVENOUS | Status: AC
  Filled 2019-07-23: qty 250

## 2019-07-23 NOTE — Progress Notes (Signed)
Patient here for oncology follow-up appointment, expresses no complaints or concerns at this time.    

## 2019-07-25 ENCOUNTER — Ambulatory Visit (INDEPENDENT_AMBULATORY_CARE_PROVIDER_SITE_OTHER): Payer: Medicaid Other | Admitting: Psychology

## 2019-07-25 DIAGNOSIS — F418 Other specified anxiety disorders: Secondary | ICD-10-CM | POA: Diagnosis not present

## 2019-07-26 ENCOUNTER — Other Ambulatory Visit: Payer: Self-pay | Admitting: *Deleted

## 2019-07-26 ENCOUNTER — Telehealth: Payer: Self-pay | Admitting: *Deleted

## 2019-07-26 ENCOUNTER — Encounter: Payer: Self-pay | Admitting: Oncology

## 2019-07-26 MED ORDER — DULOXETINE HCL 30 MG PO CPEP: 30.0000 mg | ORAL_CAPSULE | Freq: Every day | ORAL | 0 refills | Status: DC

## 2019-07-26 MED ORDER — DULOXETINE HCL 60 MG PO CPEP
60.0000 mg | ORAL_CAPSULE | Freq: Every day | ORAL | 0 refills | Status: DC
Start: 2019-07-26 — End: 2019-08-20

## 2019-07-26 NOTE — Progress Notes (Signed)
Hematology/Oncology Consult note [facility]  [phone] [phone]  Patient Care Team: McLean-Scocuzza, Nino Glow, MD as PCP - General (Internal Medicine) Rico Junker, RN as Registered Nurse   Name of the patient: Leah [name]  [id number]  Apr 27, 1965   Date of visit: 07/26/19  Diagnosis- pathological prognostic stage Ia invasive mammary carcinomapT1b pN1 cM0 ER/PR positive HER-2/neu negative s/p lumpectomy  Chief complaint/ Reason for visit-on treatment assessment prior to cycle 11 of weekly Taxol chemotherapy  Heme/Onc history: Patient is a 54 year old postmenopausal female G2 P2 L2 who recently underwent screening bilateral mammogram which showed area of concern in her left breast. This was followed by an ultrasound and Core biopsy. Ultrasound showed 1 x 1.1 x 0.7 cm hypoechoic mass at the 10:30 position of the left breast. Left axilla appeared normal. 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  Final pathology showed 9 mm grade 1 invasive mammary carcinoma1 out of 2 lymph nodes involved with macro metastatic carcinoma 3.5 mm. Anterior margin was focally positive for which patientunderwent reexcision surgery with negative marginsPT1BPN1a (sn)  MammaPrint score came back as high risk with a risk of recurrence of 29% at 10 years without chemotherapy  Patient completed 4 cycles of dose dense AC chemotherapy and is currently on weekly Taxol   Interval history-peripheral neuropathy in her hands and feet is currently stable to mildly improved after starting Cymbalta.  Reports ongoing fatigue.  ECOG PS- 1 Pain scale- 0   Review of systems- Review of Systems  Constitutional: Positive for malaise/fatigue. Negative for chills, fever and weight loss.  HENT: Negative for congestion, ear discharge and nosebleeds.   Eyes: Negative for blurred vision.    Respiratory: Negative for cough, hemoptysis, sputum production, shortness of breath and wheezing.   Cardiovascular: Negative for chest pain, palpitations, orthopnea and claudication.  Gastrointestinal: Negative for abdominal pain, blood in stool, constipation, diarrhea, heartburn, melena, nausea and vomiting.  Genitourinary: Negative for dysuria, flank pain, frequency, hematuria and urgency.  Musculoskeletal: Negative for back pain, joint pain and myalgias.  Skin: Negative for rash.  Neurological: Positive for sensory change (Peripheral neuropathy). Negative for dizziness, tingling, focal weakness, seizures, weakness and headaches.  Endo/Heme/Allergies: Does not bruise/bleed easily.  Psychiatric/Behavioral: Negative for depression and suicidal ideas. The patient does not have insomnia.       Allergies  Allergen Reactions  . Hydrocodone-Acetaminophen Hives    All over her body.  . Lactose Intolerance (Gi) Other (See Comments)    Bloating and GI upset  . Penicillins Rash    Did it involve swelling of the face/tongue/throat, SOB, or low BP? Unknown Did it involve sudden or severe rash/hives, skin peeling, or any reaction on the inside of your mouth or nose? Yes Did you need to seek medical attention at a hospital or doctor's office? Unknown When did it last happen? Childhood reaction. If all above answers are "NO", may proceed with cephalosporin use.   . Sulfa Antibiotics Rash    Full body rash      Past Medical History:  Diagnosis Date  . Anxiety   . Asthma    as a child  . Breast cancer ([place])   . Carpal tunnel syndrome    R hand   . Colon polyps   . Depression   . Eosinophilic esophagitis    noted in [place] with GI path report 09/29/14   . Esophageal stricture  s/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 Edwin Cap   . Family history of breast cancer   . Family history of prostate cancer   . Herniated disc, cervical    s/p epidural injection  .  History of kidney stones   . Hyperlipidemia   . Low back pain   . Migraines      Past Surgical History:  Procedure Laterality Date  . BREAST BIOPSY Left 12/25/2018   [place] BX, PATH PENDING  . BREAST LUMPECTOMY WITH NEEDLE LOCALIZATION AND AXILLARY SENTINEL LYMPH NODE BX Left 01/16/2019   Procedure: LEFT BREAST LUMPECTOMY WITH NEEDLE LOCALIZATION AND SENTINEL LYMPH NODE BIOPSY;  Surgeon: Jovita Kussmaul, MD;  Location: [facility] ORS;  Service: General;  Laterality: Left;  . CESAREAN SECTION    . COLONOSCOPY WITH PROPOFOL N/A 03/27/2017   Procedure: COLONOSCOPY WITH PROPOFOL;  Surgeon: Jonathon Bellows, MD;  Location: [facility] ENDOSCOPY;  Service: Gastroenterology;  Laterality: N/A;  . ESOPHAGOGASTRODUODENOSCOPY    . ESOPHAGOGASTRODUODENOSCOPY (EGD) WITH PROPOFOL N/A 03/27/2017   Procedure: ESOPHAGOGASTRODUODENOSCOPY (EGD) WITH PROPOFOL WITH DILATION;  Surgeon: Jonathon Bellows, MD;  Location: [facility] ENDOSCOPY;  Service: Gastroenterology;  Laterality: N/A;  . PORTACATH PLACEMENT N/A 02/15/2019   Procedure: INSERTION PORT-A-CATH WITH POSSIBLE ULTRASOUND;  Surgeon: Jovita Kussmaul, MD;  Location: [facility] ORS;  Service: General;  Laterality: N/A;  . RE-EXCISION OF BREAST CANCER,SUPERIOR MARGINS Left 02/15/2019   Procedure: RE-EXCISION OF LEFT BREAST CANCER ANTERIOR MARGINS;  Surgeon: Jovita Kussmaul, MD;  Location: [facility] ORS;  Service: General;  Laterality: Left;  . THROAT SURGERY  2015  . UMBILICAL HERNIA REPAIR  2006    x 2   w mesh per pt    Social History   Socioeconomic History  . Marital status: Significant Other    Spouse name: Not on file  . Number of children: Not on file  . Years of education: Not on file  . Highest education level: Not on file  Occupational History  . Not on file  Tobacco Use  . Smoking status: Former Smoker    Packs/day: 1.00    Years: 20.00    Pack years: 20.00    Quit date: 06/02/2006    Years since quitting: 13.1  . Smokeless tobacco: Never Used  Substance and Sexual Activity  .  Alcohol use: Yes    Alcohol/week: 2.0 - 3.0 standard drinks    Types: 2 - 3 Standard drinks or equivalent per week    Comment: socially  . Drug use: No  . Sexual activity: Yes    Partners: Male  Other Topics Concern  . 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 As of 02/26/17    Divorced.    Former smoker 20+ years quit in 1997 then started again for 5 years and quit 10-12 years ago as of 03/08/17    Social Determinants of Health   Financial Resource Strain:   . Difficulty of Paying Living Expenses:   Food Insecurity:   . Worried About Charity fundraiser in the Last Year:   . Arboriculturist in the Last Year:   Transportation Needs:   . Film/video editor (Medical):   Marland Kitchen Lack of Transportation (Non-Medical):   Physical Activity:   . Days of Exercise per Week:   . Minutes of Exercise  per Session:   Stress:   . Feeling of Stress :   Social Connections:   . Frequency of Communication with Friends and Family:   . Frequency of Social Gatherings with Friends and Family:   . Attends Religious Services:   . Active Member of Clubs or Organizations:   . Attends Archivist Meetings:   Marland Kitchen Marital Status:   Intimate Partner Violence:   . Fear of Current or Ex-Partner:   . Emotionally Abused:   Marland Kitchen Physically Abused:   . Sexually Abused:     Family History  Problem Relation Age of Onset  . Breast cancer Mother        dx late 28s  . Thyroid disease Mother   . Colon polyps Mother   . Prostate cancer Father        dx 14  . Breast cancer Maternal Grandmother        dx late 53s  . Cancer Maternal Uncle        possibly, unsure type     Current Outpatient Medications:  .  acetaminophen (TYLENOL) 500 MG tablet, Take 500-1,000 mg by mouth every 6 (six) hours as needed (for pain.)., Disp: , Rfl:  .  Cholecalciferol (VITAMIN D-3) 125 MCG (5000  UT) TABS, Take 5,000 Units by mouth daily., Disp: , Rfl:  .  Cyanocobalamin (VITAMIN B-12 PO), Take 1 tablet by mouth daily., Disp: , Rfl:  .  dexamethasone (DECADRON) 4 MG tablet, TAKE 2 TABLETS BY MOUTH ONCE A DAY ON THE DAY AFTER CHEMOTHERAPY AND THEN TAKE 2 TABLETS TWO TIMES A DAY FOR 2 DAYS. TAKE WITH FOOD., Disp: 30 tablet, Rfl: 1 .  DULoxetine (CYMBALTA) 30 MG capsule, Take 1 capsule (30 mg total) by mouth daily. 1 capsule daily x7 days and then 2 tablets daily to end of this rx and then will refill with 60 mg tablet in future, Disp: 53 capsule, Rfl: 0 .  fluticasone (FLONASE) 50 MCG/ACT nasal spray, Place 2 sprays into both nostrils daily. Max 2 sprays, Disp: 16 g, Rfl: 12 .  gabapentin (NEURONTIN) 300 MG capsule, Take 300 mg by mouth at bedtime., Disp: , Rfl:  .  lidocaine-prilocaine (EMLA) cream, Apply to affected area once, Disp: 30 g, Rfl: 3 .  meloxicam (MOBIC) 15 MG tablet, Take 15 mg by mouth daily., Disp: , Rfl:  .  methocarbamol (ROBAXIN) 750 MG tablet, Take 1 tablet (750 mg total) by mouth every 6 (six) hours as needed for muscle spasms., Disp: 120 tablet, Rfl: 5 .  montelukast (SINGULAIR) 10 MG tablet, Take 1 tablet (10 mg total) by mouth at bedtime., Disp: 90 tablet, Rfl: 3 .  OLANZapine (ZYPREXA) 10 MG tablet, TAKE 1 TABLET BY MOUTH EVERYDAY AT BEDTIME, Disp: 90 tablet, Rfl: 1 .  omeprazole (PRILOSEC) 40 MG capsule, Take 40 mg by mouth every evening. , Disp: , Rfl:  .  tiZANidine (ZANAFLEX) 4 MG tablet, Take 4 mg by mouth at bedtime. , Disp: , Rfl:  .  traZODone (DESYREL) 50 MG tablet, Take 1-1.5 tablets (50-75 mg total) by mouth at bedtime as needed for sleep. (Patient taking differently: Take 50 mg by mouth at bedtime as needed for sleep. ), Disp: 135 tablet, Rfl: 4 .  letrozole (FEMARA) 2.5 MG tablet, Take 1 tablet (2.5 mg total) by mouth daily. Do not start med until you complete radiation, Disp: 30 tablet, Rfl: 2 .  meclizine (ANTIVERT) 12.5 MG tablet, Take 1 tablet (12.5 mg  total) by mouth  3 (three) times daily as needed for dizziness. (Patient not taking: Reported on 07/23/2019), Disp: 90 tablet, Rfl: 1 .  traMADol (ULTRAM) 50 MG tablet, Take 1-2 tablets (50-100 mg total) by mouth every 6 (six) hours as needed. (Patient not taking: Reported on 07/23/2019), Disp: 20 tablet, Rfl: 0  Physical exam:  Vitals:   07/23/19 0951  BP: 124/81  Pulse: 83  Resp: 16  Temp: (!) 95.2 F (35.1 C)  TempSrc: Tympanic  SpO2: 100%  Weight: 190 lb 12.8 oz (86.5 kg)   Physical Exam Constitutional:      General: She is not in acute distress. Cardiovascular:     Rate and Rhythm: Normal rate and regular rhythm.     Heart sounds: Normal heart sounds.  Pulmonary:     Effort: Pulmonary effort is normal.     Breath sounds: Normal breath sounds.  Abdominal:     General: Bowel sounds are normal.     Palpations: Abdomen is soft.  Skin:    General: Skin is warm and dry.  Neurological:     Mental Status: She is alert and oriented to person, place, and time.      CMP Latest Ref Rng & Units 07/23/2019  Glucose 70 - 99 mg/dL 126(H)  BUN 6 - 20 mg/dL 18  Creatinine 0.44 - 1.00 mg/dL 0.71  Sodium 135 - 145 mmol/L 137  Potassium 3.5 - 5.1 mmol/L 3.9  Chloride 98 - 111 mmol/L 105  CO2 22 - 32 mmol/L 24  Calcium 8.9 - 10.3 mg/dL 8.6(L)  Total Protein 6.5 - 8.1 g/dL 6.3(L)  Total Bilirubin 0.3 - 1.2 mg/dL 0.9  Alkaline Phos 38 - 126 U/L 49  AST 15 - 41 U/L 27  ALT 0 - 44 U/L 38   CBC Latest Ref Rng & Units 07/23/2019  WBC 4.0 - 10.5 K/uL 5.7  Hemoglobin 12.0 - 15.0 g/dL 11.8(L)  Hematocrit 36.0 - 46.0 % 33.5(L)  Platelets 150 - 400 K/uL 274      Assessment and plan- Patient is a 54 y.o. female with prior history of polycythemia now with newly diagnosed invasive mammary carcinoma pathologic prognostic stage Ia of the left breast pT1b pN1 acM0 ER/PR positive HER-2/neu negative. She is s/p lumpectomywith high risk MammaPrint score.She is s/p 4 cycles of dose dense AC  chemotherapy.  Counts okay to proceed with cycle 11 of weekly Taxol chemotherapy today.  She will get weekly Taxol next week which would be her 12th and last cycle.  Following this she will be seen by radiation oncology to discuss adjuvant radiation treatment.  Chemo-induced peripheral neuropathy: Currently stable continue Cymbalta.  Chemo-induced anemia: Mild continue to monitor.  Given that her tumor was ER PR positive hormone therapy is indicated at this time.  I discussed the role for hormone therapy. Given that she is postmenopausal I would favor 10 years of adjuvant hormone therapy with aromatase inhibitor given her high risk disease.. I discussed the risks and benefits of letrozole including all but not limited to fatigue, hypercholesterolemia, hot flashes, arthralgias and worsening bone health.  Patient will also need to be on calcium 1200 mg along with vitamin D 800 international units.  We will obtain a baseline bone density scan written information about letrozole given to the patient. I would like her to finish radiation therapy and start hormone therapy thereafter. Patient verbalized understanding and agrees to proceed.  Treatment will be given with a curative intent  Also discussed the fact that patient has high  risk ER positive disease there would be a role for adjuvant Zometa every 6 months up to 5 years as per ASCO guidelines.  Discussed risks and benefits of Zometa including all but not limited to fatigue, hypocalcemia and possible risk of osteonecrosis of the jaw.  We will need dental clearance prior to starting Zometa.  Patient is willing to see her dentist in the next couple of months for the same.  I will tentatively see her back in 4 months with CBC with differential, CMP to see how she is tolerating her letrozole and for the first dose of Zometa    Visit Diagnosis 1. Malignant neoplasm of upper-inner quadrant of left breast in female, estrogen receptor positive ([place])   2.  Encounter for antineoplastic chemotherapy   3. Goals of care, counseling/discussion   4. Chemotherapy-induced peripheral neuropathy ([place])      Dr. Randa Evens, MD, MPH [facility] [phone] 07/26/2019 7:52 AM

## 2019-07-26 NOTE — Telephone Encounter (Signed)
This has been done.

## 2019-07-29 ENCOUNTER — Other Ambulatory Visit: Payer: Self-pay | Admitting: *Deleted

## 2019-07-29 ENCOUNTER — Encounter: Payer: Self-pay | Admitting: Oncology

## 2019-07-29 DIAGNOSIS — R197 Diarrhea, unspecified: Secondary | ICD-10-CM

## 2019-07-29 NOTE — Telephone Encounter (Signed)
Postpone chemo by 1 week. Check c diff first

## 2019-07-30 ENCOUNTER — Other Ambulatory Visit: Payer: Self-pay | Admitting: *Deleted

## 2019-07-30 ENCOUNTER — Other Ambulatory Visit: Payer: Self-pay

## 2019-07-30 ENCOUNTER — Other Ambulatory Visit: Payer: Self-pay | Admitting: Oncology

## 2019-07-30 ENCOUNTER — Inpatient Hospital Stay: Payer: Medicaid Other

## 2019-07-30 DIAGNOSIS — R197 Diarrhea, unspecified: Secondary | ICD-10-CM

## 2019-07-30 DIAGNOSIS — Z5111 Encounter for antineoplastic chemotherapy: Secondary | ICD-10-CM | POA: Diagnosis not present

## 2019-07-30 LAB — C DIFFICILE QUICK SCREEN W PCR REFLEX
C Diff antigen: NEGATIVE
C Diff interpretation: NOT DETECTED
C Diff toxin: NEGATIVE

## 2019-07-30 NOTE — Progress Notes (Signed)
Pharmacist Chemotherapy Monitoring - Follow Up Assessment    I verify that I have reviewed each item in the below checklist:  . Regimen for the patient is scheduled for the appropriate day and plan matches scheduled date. Marland Kitchen Appropriate non-routine labs are ordered dependent on drug ordered. . If applicable, additional medications reviewed and ordered per protocol based on lifetime cumulative doses and/or treatment regimen.   Plan for follow-up and/or issues identified: No . I-vent associated with next due treatment: No . MD and/or nursing notified: No  Leah [name] K 07/30/2019 8:40 AM

## 2019-08-01 LAB — GI PATHOGEN PANEL BY PCR, STOOL

## 2019-08-01 NOTE — Telephone Encounter (Signed)
I can see her next week on the day of her chemo and address her concerns. She can try topical benadryl until then. She will get chemo as planned

## 2019-08-06 ENCOUNTER — Inpatient Hospital Stay (HOSPITAL_BASED_OUTPATIENT_CLINIC_OR_DEPARTMENT_OTHER): Payer: Medicaid Other | Admitting: Oncology

## 2019-08-06 ENCOUNTER — Inpatient Hospital Stay: Payer: Medicaid Other

## 2019-08-06 ENCOUNTER — Other Ambulatory Visit: Payer: Self-pay

## 2019-08-06 ENCOUNTER — Encounter: Payer: Self-pay | Admitting: Oncology

## 2019-08-06 VITALS — BP 135/77 | HR 84 | Temp 98.4°F | Resp 16 | Ht 63.0 in | Wt 192.5 lb

## 2019-08-06 DIAGNOSIS — C50212 Malignant neoplasm of upper-inner quadrant of left female breast: Secondary | ICD-10-CM

## 2019-08-06 DIAGNOSIS — Z5111 Encounter for antineoplastic chemotherapy: Secondary | ICD-10-CM

## 2019-08-06 DIAGNOSIS — Z95828 Presence of other vascular implants and grafts: Secondary | ICD-10-CM

## 2019-08-06 DIAGNOSIS — T451X5D Adverse effect of antineoplastic and immunosuppressive drugs, subsequent encounter: Secondary | ICD-10-CM | POA: Diagnosis not present

## 2019-08-06 DIAGNOSIS — G620 Drug-induced polyneuropathy: Secondary | ICD-10-CM | POA: Diagnosis not present

## 2019-08-06 DIAGNOSIS — Z170 Estrogen receptor positive status [ER+]: Secondary | ICD-10-CM | POA: Diagnosis not present

## 2019-08-06 DIAGNOSIS — T451X5A Adverse effect of antineoplastic and immunosuppressive drugs, initial encounter: Secondary | ICD-10-CM

## 2019-08-06 LAB — CBC WITH DIFFERENTIAL/PLATELET
Abs Immature Granulocytes: 0.03 10*3/uL (ref 0.00–0.07)
Basophils Absolute: 0.1 10*3/uL (ref 0.0–0.1)
Basophils Relative: 1 %
Eosinophils Absolute: 0.1 10*3/uL (ref 0.0–0.5)
Eosinophils Relative: 2 %
HCT: 33.2 % — ABNORMAL LOW (ref 36.0–46.0)
Hemoglobin: 11.7 g/dL — ABNORMAL LOW (ref 12.0–15.0)
Immature Granulocytes: 1 %
Lymphocytes Relative: 24 %
Lymphs Abs: 1.4 10*3/uL (ref 0.7–4.0)
MCH: 32.3 pg (ref 26.0–34.0)
MCHC: 35.2 g/dL (ref 30.0–36.0)
MCV: 91.7 fL (ref 80.0–100.0)
Monocytes Absolute: 0.9 10*3/uL (ref 0.1–1.0)
Monocytes Relative: 14 %
Neutro Abs: 3.6 10*3/uL (ref 1.7–7.7)
Neutrophils Relative %: 58 %
Platelets: 291 10*3/uL (ref 150–400)
RBC: 3.62 MIL/uL — ABNORMAL LOW (ref 3.87–5.11)
RDW: 14.1 % (ref 11.5–15.5)
WBC: 6.1 10*3/uL (ref 4.0–10.5)
nRBC: 0 % (ref 0.0–0.2)

## 2019-08-06 LAB — COMPREHENSIVE METABOLIC PANEL
ALT: 29 U/L (ref 0–44)
AST: 23 U/L (ref 15–41)
Albumin: 3.5 g/dL (ref 3.5–5.0)
Alkaline Phosphatase: 53 U/L (ref 38–126)
Anion gap: 8 (ref 5–15)
BUN: 20 mg/dL (ref 6–20)
CO2: 26 mmol/L (ref 22–32)
Calcium: 8.7 mg/dL — ABNORMAL LOW (ref 8.9–10.3)
Chloride: 103 mmol/L (ref 98–111)
Creatinine, Ser: 0.75 mg/dL (ref 0.44–1.00)
GFR calc Af Amer: >60 mL/min (ref >60)
GFR calc non Af Amer: >60 mL/min (ref >60)
Glucose, Bld: 114 mg/dL — ABNORMAL HIGH (ref 70–99)
Potassium: 3.7 mmol/L (ref 3.5–5.1)
Sodium: 137 mmol/L (ref 135–145)
Total Bilirubin: 0.8 mg/dL (ref 0.3–1.2)
Total Protein: 6.2 g/dL — ABNORMAL LOW (ref 6.5–8.1)

## 2019-08-06 MED ORDER — SODIUM CHLORIDE 0.9% FLUSH
10.0000 mL | INTRAVENOUS | Status: DC | PRN
  Administered 2019-08-06: 10 mL via INTRAVENOUS
  Filled 2019-08-06: qty 10

## 2019-08-06 MED ORDER — SODIUM CHLORIDE 0.9 % IV SOLN
Freq: Once | INTRAVENOUS | Status: AC
  Filled 2019-08-06: qty 250

## 2019-08-06 MED ORDER — SODIUM CHLORIDE 0.9 % IV SOLN
65.0000 mg/m2 | Freq: Once | INTRAVENOUS | Status: AC
  Administered 2019-08-06: 126 mg via INTRAVENOUS
  Filled 2019-08-06: qty 21

## 2019-08-06 MED ORDER — DIPHENHYDRAMINE HCL 50 MG/ML IJ SOLN
INTRAMUSCULAR | Status: AC
  Filled 2019-08-06: qty 1

## 2019-08-06 MED ORDER — DIPHENHYDRAMINE HCL 50 MG/ML IJ SOLN
50.0000 mg | Freq: Once | INTRAMUSCULAR | Status: AC
  Administered 2019-08-06: 50 mg via INTRAVENOUS

## 2019-08-06 MED ORDER — HEPARIN SOD (PORK) LOCK FLUSH 100 UNIT/ML IV SOLN
500.0000 [IU] | Freq: Once | INTRAVENOUS | Status: AC | PRN
  Administered 2019-08-06: 500 [IU]
  Filled 2019-08-06: qty 5

## 2019-08-06 MED ORDER — SODIUM CHLORIDE 0.9% FLUSH
10.0000 mL | INTRAVENOUS | Status: DC | PRN
  Filled 2019-08-06: qty 10

## 2019-08-06 MED ORDER — HEPARIN SOD (PORK) LOCK FLUSH 100 UNIT/ML IV SOLN
INTRAVENOUS | Status: AC
  Filled 2019-08-06: qty 5

## 2019-08-06 MED ORDER — FAMOTIDINE IN NACL 20-0.9 MG/50ML-% IV SOLN
20.0000 mg | Freq: Once | INTRAVENOUS | Status: AC
  Administered 2019-08-06: 20 mg via INTRAVENOUS

## 2019-08-06 MED ORDER — SODIUM CHLORIDE 0.9 % IV SOLN
20.0000 mg | Freq: Once | INTRAVENOUS | Status: AC
  Administered 2019-08-06: 20 mg via INTRAVENOUS
  Filled 2019-08-06: qty 20

## 2019-08-06 NOTE — Progress Notes (Signed)
Diarrhea has gone. Feeling god. Eating and drinking good

## 2019-08-08 ENCOUNTER — Other Ambulatory Visit: Payer: Self-pay

## 2019-08-08 ENCOUNTER — Encounter: Payer: Self-pay | Admitting: Radiation Oncology

## 2019-08-09 ENCOUNTER — Ambulatory Visit
Admission: RE | Admit: 2019-08-09 | Discharge: 2019-08-09 | Disposition: A | Discharge status: Home / Self Care | Payer: Medicaid Other | Source: Ambulatory Visit | Attending: Radiation Oncology | Admitting: Radiation Oncology

## 2019-08-09 ENCOUNTER — Encounter: Payer: Self-pay | Admitting: Radiation Oncology

## 2019-08-09 VITALS — BP 128/70 | HR 97 | Temp 96.0°F | Resp 16 | Wt 191.2 lb

## 2019-08-09 DIAGNOSIS — Z170 Estrogen receptor positive status [ER+]: Secondary | ICD-10-CM | POA: Insufficient documentation

## 2019-08-09 DIAGNOSIS — C50212 Malignant neoplasm of upper-inner quadrant of left female breast: Secondary | ICD-10-CM | POA: Insufficient documentation

## 2019-08-09 DIAGNOSIS — C50012 Malignant neoplasm of nipple and areola, left female breast: Secondary | ICD-10-CM

## 2019-08-09 NOTE — Progress Notes (Signed)
Hematology/Oncology Consult note [facility]  [phone] [phone]  Patient Care Team: McLean-Scocuzza, Nino Glow, MD as PCP - General (Internal Medicine) Rico Junker, RN as Registered Nurse   Name of the patient: Leah [name]  [id number]  1966-01-18   Date of visit: 08/09/19  Diagnosis- pathological prognostic stage Ia invasive mammary carcinomapT1b pN1 cM0 ER/PR positive HER-2/neu negative s/p lumpectomy   Chief complaint/ Reason for visit-on treatment assessment prior to cycle 12 of weekly Taxol chemotherapy  Heme/Onc history: Patient is a 54 year old postmenopausal female G2 P2 L2 who recently underwent screening bilateral mammogram which showed area of concern in her left breast. This was followed by an ultrasound and Core biopsy. Ultrasound showed 1 x 1.1 x 0.7 cm hypoechoic mass at the 10:30 position of the left breast. Left axilla appeared normal. 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  Final pathology showed 9 mm grade 1 invasive mammary carcinoma1 out of 2 lymph nodes involved with macro metastatic carcinoma 3.5 mm. Anterior margin was focally positive for which patientunderwent reexcision surgery with negative marginsPT1BPN1a (sn)  MammaPrint score came back as high risk with a risk of recurrence of 29% at 10 years without chemotherapy  Patient completed 4 cycles of dose dense AC chemotherapy and is currently on weekly Taxol   Interval history-diarrhea has resolved.  She does not have any green stools anymore.  Neuropathy is presently stable with Cymbalta.  ECOG PS- 1 Pain scale- 0   Review of systems- Review of Systems  Constitutional: Positive for malaise/fatigue. Negative for chills, fever and weight loss.  HENT: Negative for congestion, ear discharge and nosebleeds.   Eyes: Negative for blurred vision.  Respiratory: Negative  for cough, hemoptysis, sputum production, shortness of breath and wheezing.   Cardiovascular: Negative for chest pain, palpitations, orthopnea and claudication.  Gastrointestinal: Negative for abdominal pain, blood in stool, constipation, diarrhea, heartburn, melena, nausea and vomiting.  Genitourinary: Negative for dysuria, flank pain, frequency, hematuria and urgency.  Musculoskeletal: Negative for back pain, joint pain and myalgias.  Skin: Negative for rash.  Neurological: Positive for sensory change (Peripheral neuropathy). Negative for dizziness, tingling, focal weakness, seizures, weakness and headaches.  Endo/Heme/Allergies: Does not bruise/bleed easily.  Psychiatric/Behavioral: Negative for depression and suicidal ideas. The patient does not have insomnia.      Allergies  Allergen Reactions  . Hydrocodone-Acetaminophen Hives    All over her body.  . Lactose Intolerance (Gi) Other (See Comments)    Bloating and GI upset  . Penicillins Rash    Did it involve swelling of the face/tongue/throat, SOB, or low BP? Unknown Did it involve sudden or severe rash/hives, skin peeling, or any reaction on the inside of your mouth or nose? Yes Did you need to seek medical attention at a hospital or doctor's office? Unknown When did it last happen? Childhood reaction. If all above answers are "NO", may proceed with cephalosporin use.   . Sulfa Antibiotics Rash    Full body rash      Past Medical History:  Diagnosis Date  . Anxiety   . Asthma    as a child  . Breast cancer ([place])   . Carpal tunnel syndrome    R hand   . Colon polyps   . Depression   . Eosinophilic esophagitis    noted in [place] with GI path report 09/29/14   . Esophageal stricture    s/p dilatation  09/29/14 with schlatski ring in CT Dr. Edwin Cap   . Family history of breast cancer   . Family history of prostate cancer   . Herniated disc, cervical    s/p epidural injection  . History of kidney stones     . Hyperlipidemia   . Low back pain   . Migraines      Past Surgical History:  Procedure Laterality Date  . BREAST BIOPSY Left 12/25/2018   [place] BX, PATH PENDING  . BREAST LUMPECTOMY WITH NEEDLE LOCALIZATION AND AXILLARY SENTINEL LYMPH NODE BX Left 01/16/2019   Procedure: LEFT BREAST LUMPECTOMY WITH NEEDLE LOCALIZATION AND SENTINEL LYMPH NODE BIOPSY;  Surgeon: Jovita Kussmaul, MD;  Location: [facility] ORS;  Service: General;  Laterality: Left;  . CESAREAN SECTION    . COLONOSCOPY WITH PROPOFOL N/A 03/27/2017   Procedure: COLONOSCOPY WITH PROPOFOL;  Surgeon: Jonathon Bellows, MD;  Location[facility] ENDOSCOPY;  Service: Gastroenterology;  Laterality: N/A;  . ESOPHAGOGASTRODUODENOSCOPY    . ESOPHAGOGASTRODUODENOSCOPY (EGD) WITH PROPOFOL N/A 03/27/2017   Procedure: ESOPHAGOGASTRODUODENOSCOPY (EGD) WITH PROPOFOL WITH DILATION;  Surgeon: Jonathon Bellows, MD;  Location: [facility] ENDOSCOPY;  Service: Gastroenterology;  Laterality: N/A;  . PORTACATH PLACEMENT N/A 02/15/2019   Procedure: INSERTION PORT-A-CATH WITH POSSIBLE ULTRASOUND;  Surgeon: Jovita Kussmaul, MD;  Location: [facility] ORS;  Service: General;  Laterality: N/A;  . RE-EXCISION OF BREAST CANCER,SUPERIOR MARGINS Left 02/15/2019   Procedure: RE-EXCISION OF LEFT BREAST CANCER ANTERIOR MARGINS;  Surgeon: Jovita Kussmaul, MD;  Location: [facility] ORS;  Service: General;  Laterality: Left;  . THROAT SURGERY  2015  . UMBILICAL HERNIA REPAIR  2006    x 2   w mesh per pt    Social History   Socioeconomic History  . Marital status: Significant Other    Spouse name: Not on file  . Number of children: Not on file  . Years of education: Not on file  . Highest education level: Not on file  Occupational History  . Not on file  Tobacco Use  . Smoking status: Former Smoker    Packs/day: 1.00    Years: 20.00    Pack years: 20.00    Quit date: 06/02/2006    Years since quitting: 13.1  . Smokeless tobacco: Never Used  Substance and Sexual Activity  . Alcohol use: Not Currently   . Drug use: No  . Sexual activity: Yes    Partners: Male  Other Topics Concern  . 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 As of 02/26/17    Divorced.    Former smoker 20+ years quit in 1997 then started again for 5 years and quit 10-12 years ago as of 03/08/17    Social Determinants of Health   Financial Resource Strain:   . Difficulty of Paying Living Expenses:   Food Insecurity:   . Worried About Charity fundraiser in the Last Year:   . Arboriculturist in the Last Year:   Transportation Needs:   . Film/video editor (Medical):   Marland Kitchen Lack of Transportation (Non-Medical):   Physical Activity:   . Days of Exercise per Week:   . Minutes of Exercise per Session:   Stress:   . Feeling of Stress :   Social Connections:   . Frequency of Communication with Friends and Family:   .  Frequency of Social Gatherings with Friends and Family:   . Attends Religious Services:   . Active Member of Clubs or Organizations:   . Attends Archivist Meetings:   Marland Kitchen Marital Status:   Intimate Partner Violence:   . Fear of Current or Ex-Partner:   . Emotionally Abused:   Marland Kitchen Physically Abused:   . Sexually Abused:     Family History  Problem Relation Age of Onset  . Breast cancer Mother        dx late 78s  . Thyroid disease Mother   . Colon polyps Mother   . Prostate cancer Father        dx 5  . Breast cancer Maternal Grandmother        dx late 11s  . Cancer Maternal Uncle        possibly, unsure type     Current Outpatient Medications:  .  acetaminophen (TYLENOL) 500 MG tablet, Take 500-1,000 mg by mouth every 6 (six) hours as needed (for pain.)., Disp: , Rfl:  .  Cholecalciferol (VITAMIN D-3) 125 MCG (5000 UT) TABS, Take 5,000 Units by mouth daily., Disp: , Rfl:  .  Cyanocobalamin (VITAMIN B-12 PO), Take 1 tablet by mouth daily., Disp: , Rfl:   .  DULoxetine (CYMBALTA) 60 MG capsule, Take 1 capsule (60 mg total) by mouth daily., Disp: 30 capsule, Rfl: 0 .  fluticasone (FLONASE) 50 MCG/ACT nasal spray, Place 2 sprays into both nostrils daily. Max 2 sprays, Disp: 16 g, Rfl: 12 .  gabapentin (NEURONTIN) 300 MG capsule, Take 300 mg by mouth at bedtime., Disp: , Rfl:  .  lidocaine-prilocaine (EMLA) cream, Apply to affected area once, Disp: 30 g, Rfl: 3 .  meloxicam (MOBIC) 15 MG tablet, Take 15 mg by mouth daily., Disp: , Rfl:  .  methocarbamol (ROBAXIN) 750 MG tablet, Take 1 tablet (750 mg total) by mouth every 6 (six) hours as needed for muscle spasms., Disp: 120 tablet, Rfl: 5 .  montelukast (SINGULAIR) 10 MG tablet, Take 1 tablet (10 mg total) by mouth at bedtime., Disp: 90 tablet, Rfl: 3 .  omeprazole (PRILOSEC) 40 MG capsule, Take 40 mg by mouth every evening. , Disp: , Rfl:  .  tiZANidine (ZANAFLEX) 4 MG tablet, Take 4 mg by mouth at bedtime. , Disp: , Rfl:  .  traMADol (ULTRAM) 50 MG tablet, Take 1-2 tablets (50-100 mg total) by mouth every 6 (six) hours as needed., Disp: 20 tablet, Rfl: 0 .  traZODone (DESYREL) 50 MG tablet, Take 1-1.5 tablets (50-75 mg total) by mouth at bedtime as needed for sleep. (Patient taking differently: Take 50 mg by mouth at bedtime as needed for sleep. ), Disp: 135 tablet, Rfl: 4 .  dexamethasone (DECADRON) 4 MG tablet, TAKE 2 TABLETS BY MOUTH ONCE A DAY ON THE DAY AFTER CHEMOTHERAPY AND THEN TAKE 2 TABLETS TWO TIMES A DAY FOR 2 DAYS. TAKE WITH FOOD., Disp: 30 tablet, Rfl: 1 .  letrozole (FEMARA) 2.5 MG tablet, Take 1 tablet (2.5 mg total) by mouth daily. Do not start med until you complete radiation (Patient not taking: Reported on 08/08/2019), Disp: 30 tablet, Rfl: 2 .  meclizine (ANTIVERT) 12.5 MG tablet, Take 1 tablet (12.5 mg total) by mouth 3 (three) times daily as needed for dizziness., Disp: 90 tablet, Rfl: 1 .  OLANZapine (ZYPREXA) 10 MG tablet, TAKE 1 TABLET BY MOUTH EVERYDAY AT BEDTIME, Disp: 90  tablet, Rfl: 1  Current Facility-Administered Medications:  .  sodium chloride  flush (NS) 0.9 % injection 10 mL, 10 mL, Intracatheter, PRN, Sindy Guadeloupe, MD  Physical exam:  Vitals:   08/06/19 1008  BP: 135/77  Pulse: 84  Resp: 16  Temp: 98.4 F (36.9 C)  TempSrc: Oral  Weight: 192 lb 8 oz (87.3 kg)  Height: _0  (1.6 m)   Physical Exam Constitutional:      General: She is not in acute distress. Eyes:     Pupils: Pupils are equal, round, and reactive to light.  Cardiovascular:     Rate and Rhythm: Normal rate and regular rhythm.     Heart sounds: Normal heart sounds.  Pulmonary:     Effort: Pulmonary effort is normal.     Breath sounds: Normal breath sounds.  Abdominal:     General: Bowel sounds are normal.     Palpations: Abdomen is soft.  Skin:    General: Skin is warm and dry.  Neurological:     Mental Status: She is alert and oriented to person, place, and time.      CMP Latest Ref Rng & Units 08/06/2019  Glucose 70 - 99 mg/dL 114(H)  BUN 6 - 20 mg/dL 20  Creatinine 0.44 - 1.00 mg/dL 0.75  Sodium 135 - 145 mmol/L 137  Potassium 3.5 - 5.1 mmol/L 3.7  Chloride 98 - 111 mmol/L 103  CO2 22 - 32 mmol/L 26  Calcium 8.9 - 10.3 mg/dL 8.7(L)  Total Protein 6.5 - 8.1 g/dL 6.2(L)  Total Bilirubin 0.3 - 1.2 mg/dL 0.8  Alkaline Phos 38 - 126 U/L 53  AST 15 - 41 U/L 23  ALT 0 - 44 U/L 29   CBC Latest Ref Rng & Units 08/06/2019  WBC 4.0 - 10.5 K/uL 6.1  Hemoglobin 12.0 - 15.0 g/dL 11.7(L)  Hematocrit 36.0 - 46.0 % 33.2(L)  Platelets 150 - 400 K/uL 291     Assessment and plan- Patient is a 54 y.o. female  with prior history of polycythemia now with newly diagnosed invasive mammary carcinoma pathologic prognostic stage Ia of the left breast pT1b pN1 acM0 ER/PR positive HER-2/neu negative. She is s/p lumpectomywith high risk MammaPrint score.She is s/p 4 cycles of dose dense AC chemotherapy.She is here for on treatment assessment prior to cycle 12 of weekly Taxol  chemotherapy  Counts okay to proceed with cycle 12 of weekly Taxol chemotherapy today.  This would be her last chemotherapy and then she will go on to receive adjuvant radiation treatment  Patient had diarrhea due to which she could not get treatment last week.  C. difficile testing was negative.  Diarrhea is currently resolved.  Chemo-induced peripheral neuropathy: Currently stable on Cymbalta.  I will reassess her symptomsWhen she sees me in September.  If she continues to have significant neuropathy I will refer her to acupuncture at that time and also titrate her dose of gabapentin.  Patient will start taking letrozole upon completion of radiation treatment.  She will be scheduled for her first dose of Zometa when she sees me in September  I have encouraged patient to work on physical activity and exercise and weight loss.  Patient verbalized understanding   Visit Diagnosis 1. Malignant neoplasm of upper-inner quadrant of left breast in female, estrogen receptor positive ([place])   2. Encounter for antineoplastic chemotherapy   3. Chemotherapy-induced peripheral neuropathy ([place])      Dr. Randa Evens, MD, MPH [facility] at [facility] [phone] 08/09/2019 9:50 AM

## 2019-08-09 NOTE — Progress Notes (Signed)
Radiation Oncology Follow up Note  Name: Leah [name]   Date:   08/09/2019 MRN:  [id number] DOB: 02/23/1966    This 54 y.o. female presents to the clinic today for reevaluation of patient with known stage Ia (T1b N1 M0) ER/PR positive HER-2 negative invasive mammary carcinoma with.  MammaPrint score of recurrence risk of 29% completing dose dense AC plus weekly Taxol  REFERRING PROVIDER: McLean-Scocuzza, Tracy *  HPI: Patient is a 54-year-old female originally consulted back in.  December 2020 when she presented with pathologically staged T1b N1 M0 ER/PR positive HER-2 negative invasive mammary carcinoma.  She had a positive margin although that was reexcised.  She has completed dose dense AC plus weekly Taxol has developed some slight peripheral neuropathy in her hands and feet.  She is otherwise doing well.  Her initial surgery showed 1 of 2 sentinel lymph nodes involved with macro metastatic 3.5 mm of metastatic disease.  She specifically [place] breast tenderness cough or bone pain.  COMPLICATIONS OF TREATMENT: none  FOLLOW UP COMPLIANCE: keeps appointments   PHYSICAL EXAM:  BP 128/70 (BP Location: Right Arm, Patient Position: Sitting, Cuff Size: Large)   Pulse 97   Temp (!) 96 F (35.6 C) (Tympanic)   Resp 16   Wt 191 lb 3.2 oz (86.7 kg)   BMI 33.87 kg/m  Lungs are clear to A&P cardiac examination essentially unremarkable with regular rate and rhythm. No dominant mass or nodularity is noted in either breast in 2 positions examined. Incision is well-healed. No axillary or supraclavicular adenopathy is appreciated. Cosmetic result is excellent.  Well-developed well-nourished patient in NAD. HEENT reveals PERLA, EOMI, discs not visualized.  Oral cavity is clear. No oral mucosal lesions are identified. Neck is clear without evidence of cervical or supraclavicular adenopathy. Lungs are clear to A&P. Cardiac examination is essentially unremarkable with regular rate and rhythm without  murmur rub or thrill. Abdomen is benign with no organomegaly or masses noted. Motor sensory and DTR levels are equal and symmetric in the upper and lower extremities. Cranial nerves II through XII are grossly intact. Proprioception is intact. No peripheral adenopathy or edema is identified. No motor or sensory levels are noted. Crude visual fields are within normal range.  RADIOLOGY RESULTS: No current films to review  PLAN: At this time like to go ahead with whole breast and peripheral lymphatic radiation to her right breast.  Would plan on delivering 5040 cGy in 28 fractions would also boost her scar another 1400 centigrade based on the original positive margin.  Risks and benefits of treatment occluding skin reaction fatigue alteration of blood counts possible inclusion of superficial lung and slight chance of lymphedema in her right upper extremity all were discussed in detail with the patient.  She comprehends my treatment plan well.  I have personally set up and ordered CT simulation in about a week's time.  Patient comprehends my treatment plan well.  I would like to take this opportunity to thank you for allowing me to participate in the care of your patient..     , MD   

## 2019-08-14 ENCOUNTER — Inpatient Hospital Stay: Payer: Medicaid Other | Attending: Oncology | Admitting: Oncology

## 2019-08-14 ENCOUNTER — Encounter: Payer: Self-pay | Admitting: Oncology

## 2019-08-14 ENCOUNTER — Other Ambulatory Visit: Payer: Self-pay

## 2019-08-14 DIAGNOSIS — L739 Follicular disorder, unspecified: Secondary | ICD-10-CM

## 2019-08-14 DIAGNOSIS — G629 Polyneuropathy, unspecified: Secondary | ICD-10-CM | POA: Diagnosis not present

## 2019-08-14 DIAGNOSIS — J45909 Unspecified asthma, uncomplicated: Secondary | ICD-10-CM | POA: Insufficient documentation

## 2019-08-14 DIAGNOSIS — F419 Anxiety disorder, unspecified: Secondary | ICD-10-CM | POA: Diagnosis not present

## 2019-08-14 DIAGNOSIS — C50212 Malignant neoplasm of upper-inner quadrant of left female breast: Secondary | ICD-10-CM

## 2019-08-14 DIAGNOSIS — Z170 Estrogen receptor positive status [ER+]: Secondary | ICD-10-CM

## 2019-08-14 DIAGNOSIS — E785 Hyperlipidemia, unspecified: Secondary | ICD-10-CM | POA: Insufficient documentation

## 2019-08-14 DIAGNOSIS — Z803 Family history of malignant neoplasm of breast: Secondary | ICD-10-CM | POA: Diagnosis not present

## 2019-08-14 DIAGNOSIS — Z8042 Family history of malignant neoplasm of prostate: Secondary | ICD-10-CM | POA: Diagnosis not present

## 2019-08-14 DIAGNOSIS — Z87891 Personal history of nicotine dependence: Secondary | ICD-10-CM | POA: Diagnosis not present

## 2019-08-14 DIAGNOSIS — Z79899 Other long term (current) drug therapy: Secondary | ICD-10-CM | POA: Insufficient documentation

## 2019-08-14 DIAGNOSIS — Z791 Long term (current) use of non-steroidal anti-inflammatories (NSAID): Secondary | ICD-10-CM | POA: Diagnosis not present

## 2019-08-14 DIAGNOSIS — F329 Major depressive disorder, single episode, unspecified: Secondary | ICD-10-CM | POA: Insufficient documentation

## 2019-08-14 DIAGNOSIS — Z9221 Personal history of antineoplastic chemotherapy: Secondary | ICD-10-CM | POA: Diagnosis not present

## 2019-08-14 DIAGNOSIS — Z79811 Long term (current) use of aromatase inhibitors: Secondary | ICD-10-CM | POA: Diagnosis not present

## 2019-08-14 DIAGNOSIS — Z7952 Long term (current) use of systemic steroids: Secondary | ICD-10-CM | POA: Insufficient documentation

## 2019-08-14 MED ORDER — DOXYCYCLINE HYCLATE 100 MG PO TABS: 100.0000 mg | ORAL_TABLET | Freq: Two times a day (BID) | ORAL | 0 refills | Status: DC

## 2019-08-14 MED ORDER — MUPIROCIN 2 % EX OINT: 1.0000 "application " | TOPICAL_OINTMENT | Freq: Three times a day (TID) | CUTANEOUS | 0 refills | Status: DC

## 2019-08-14 NOTE — Progress Notes (Signed)
Symptom Management Consult note [facility]  [phone] [phone]  Patient Care Team: McLean-Scocuzza, Leah Glow, MD as PCP - General (Internal Medicine) Rico Junker, RN as Registered Nurse   Name of the patient: Leah [name]  [id number]  10-04-1965   Date of visit: 08/14/2019   Diagnosis- Breast Cancer   Chief complaint/ Reason for visit- Folliculitis  Heme/Onc history:  Oncology History  Malignant neoplasm of left female breast ([place])  12/28/2018 Initial Diagnosis   Breast cancer ([place])   01/03/2019 Cancer Staging   Staging form: Breast, AJCC 8th Edition - Clinical stage from 01/03/2019: Stage IA (cT1c, cN0, cM0, G2, ER+, PR+, HER2-) - Signed by Sindy Guadeloupe, MD on 01/03/2019   01/24/2019 Cancer Staging   Staging form: Breast, AJCC 8th Edition - Pathologic stage from 01/24/2019: Stage IA (pT1b, pN1a, cM0, G1, ER+, PR+, HER2-) - Signed by Sindy Guadeloupe, MD on 01/25/2019    Genetic Testing   No pathogenic variants identified. VUS in BARD1 called c.1672T>C (p.Ser558Pro) identified on the [facility] Common Hereditary Cancers Panel. The report date is 01/29/2019.  The Common Hereditary Cancers Panel offered by [facility] includes sequencing and/or deletion duplication testing of the following 47 genes: APC, ATM, AXIN2, BARD1, BMPR1A, BRCA1, BRCA2, BRIP1, CDH1, CDKN2A (p14ARF), CDKN2A (p16INK4a), CKD4, CHEK2, CTNNA1, DICER1, EPCAM (Deletion/duplication testing only), GREM1 (promoter region deletion/duplication testing only), KIT, MEN1, MLH1, MSH2, MSH3, MSH6, MUTYH, NBN, NF1, NHTL1, PALB2, PDGFRA, PMS2, POLD1, POLE, PTEN, RAD50, RAD51C, RAD51C, SDHB, SDHC, SDHD, SMAD4, SMARCA4. STK11, TP53, TSC1, TSC2, and VHL.  The following genes were evaluated for sequence changes only: SDHA and HOXB13 c.251G>A variant only.   03/19/2019 - 05/13/2019 Chemotherapy   The patient had DOXOrubicin (ADRIAMYCIN) chemo injection 112 mg, 60 mg/m2 = 112 mg,  Intravenous,  Once, 4 of 4 cycles Administration: 112 mg (03/19/2019), 112 mg (04/02/2019), 112 mg (04/16/2019), 112 mg (04/30/2019) palonosetron (ALOXI) injection 0.25 mg, 0.25 mg, Intravenous,  Once, 4 of 4 cycles Administration: 0.25 mg (03/19/2019), 0.25 mg (04/02/2019), 0.25 mg (04/16/2019), 0.25 mg (04/30/2019) pegfilgrastim (NEULASTA ONPRO KIT) injection 6 mg, 6 mg, Subcutaneous, Once, 4 of 4 cycles Administration: 6 mg (03/19/2019), 6 mg (04/02/2019), 6 mg (04/16/2019), 6 mg (04/30/2019) cyclophosphamide (CYTOXAN) 1,120 mg in sodium chloride 0.9 % 250 mL chemo infusion, 600 mg/m2 = 1,120 mg, Intravenous,  Once, 4 of 4 cycles Administration: 1,120 mg (03/19/2019), 1,120 mg (04/02/2019), 1,120 mg (04/16/2019), 1,120 mg (04/30/2019) fosaprepitant (EMEND) 150 mg in sodium chloride 0.9 % 145 mL IVPB, 150 mg, Intravenous,  Once, 4 of 4 cycles Administration: 150 mg (03/19/2019), 150 mg (04/02/2019), 150 mg (04/16/2019), 150 mg (04/30/2019)  for chemotherapy treatment.    05/14/2019 -  Chemotherapy   The patient had PACLitaxel (TAXOL) 150 mg in sodium chloride 0.9 % 250 mL chemo infusion (</= 80m/m2), 80 mg/m2 = 150 mg, Intravenous,  Once, 12 of 12 cycles Dose modification: 65 mg/m2 (original dose 80 mg/m2, Cycle 6, Reason: Other (see comments), Comment: neuropathy) Administration: 150 mg (05/14/2019), 150 mg (05/21/2019), 150 mg (05/28/2019), 150 mg (06/04/2019), 150 mg (06/11/2019), 126 mg (06/18/2019), 126 mg (06/25/2019), 126 mg (07/02/2019), 126 mg (07/09/2019), 126 mg (07/16/2019), 126 mg (07/23/2019), 126 mg (08/06/2019)  for chemotherapy treatment.     Interval history-Mrs. FPreeis a 54year old female with medical history significant for asthma, dysphagia, anxiety and depression, chronic back pain, hyperlipidemia who was recently diagnosed with breast cancer discovered on routine mammogram.  She completed 4 cycles of dose dense  AC chemotherapy and recently completed 12 cycles of weekly Taxol.  Tolerated well except for neuropathy  stable on Cymbalta as well as some diarrhea.  She met with Dr. Donella Stade last week and plan is to initiate radiation to right breast next week.  Patient presents today to symptom management for concerns of an ingrown hair that is enlarging and painful.  Location is left side mons pubis.  Pustule and erythema present.  Inflammation and tenderness present.  Nonfluctuant.  Patient notes symptoms started approximately 1 week ago.  Has tried to "pop" the lesion but was unsuccessful.  Has not used any over-the-counter products or creams.  She denies any fevers or illness, loss in appetite or weight loss, chest pain, nausea, vomiting, constipation or diarrhea.  ECOG FS:1 - Symptomatic but completely ambulatory  Review of systems- Review of Systems  Constitutional: Negative.  Negative for chills, fever, malaise/fatigue and weight loss.  HENT: Negative for congestion, ear pain and tinnitus.   Eyes: Negative.  Negative for blurred vision and double vision.  Respiratory: Negative.  Negative for cough, sputum production and shortness of breath.   Cardiovascular: Negative.  Negative for chest pain, palpitations and leg swelling.  Gastrointestinal: Negative.  Negative for abdominal pain, constipation, diarrhea, nausea and vomiting.  Genitourinary: Negative for dysuria, frequency and urgency.  Musculoskeletal: Negative for back pain and falls.  Skin: Negative.  Negative for rash.       Pustule-left side mons pubis   Neurological: Negative.  Negative for weakness and headaches.  Endo/Heme/Allergies: Negative.  Does not bruise/bleed easily.  Psychiatric/Behavioral: Negative.  Negative for depression. The patient is not nervous/anxious and does not have insomnia.      Current treatment-we will begin radiation next week  Allergies  Allergen Reactions  . Hydrocodone-Acetaminophen Hives    All over her body.  . Lactose Intolerance (Gi) Other (See Comments)    Bloating and GI upset  . Penicillins Rash    Did  it involve swelling of the face/tongue/throat, SOB, or low BP? Unknown Did it involve sudden or severe rash/hives, skin peeling, or any reaction on the inside of your mouth or nose? Yes Did you need to seek medical attention at a hospital or doctor's office? Unknown When did it last happen? Childhood reaction. If all above answers are "NO", may proceed with cephalosporin use.   . Sulfa Antibiotics Rash    Full body rash      Past Medical History:  Diagnosis Date  . Anxiety   . Asthma    as a child  . Breast cancer ([place])   . Carpal tunnel syndrome    R hand   . Colon polyps   . Depression   . Eosinophilic esophagitis    noted in [place] with GI path report 09/29/14   . Esophageal stricture    s/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 Edwin Cap   . Family history of breast cancer   . Family history of prostate cancer   . Herniated disc, cervical    s/p epidural injection  . History of kidney stones   . Hyperlipidemia   . Low back pain   . Migraines      Past Surgical History:  Procedure Laterality Date  . BREAST BIOPSY Left 12/25/2018   [place] BX, PATH PENDING  . BREAST LUMPECTOMY WITH NEEDLE LOCALIZATION AND AXILLARY SENTINEL LYMPH NODE BX Left 01/16/2019   Procedure: LEFT BREAST LUMPECTOMY WITH NEEDLE LOCALIZATION AND SENTINEL LYMPH NODE BIOPSY;  Surgeon: Marlou Starks,  Sena Hitch, MD;  Location: [facility] ORS;  Service: General;  Laterality: Left;  . CESAREAN SECTION    . COLONOSCOPY WITH PROPOFOL N/A 03/27/2017   Procedure: COLONOSCOPY WITH PROPOFOL;  Surgeon: Jonathon Bellows, MD;  Location: [facility] ENDOSCOPY;  Service: Gastroenterology;  Laterality: N/A;  . ESOPHAGOGASTRODUODENOSCOPY    . ESOPHAGOGASTRODUODENOSCOPY (EGD) WITH PROPOFOL N/A 03/27/2017   Procedure: ESOPHAGOGASTRODUODENOSCOPY (EGD) WITH PROPOFOL WITH DILATION;  Surgeon: Jonathon Bellows, MD;  Location: [facility] ENDOSCOPY;  Service: Gastroenterology;  Laterality: N/A;  . PORTACATH PLACEMENT N/A 02/15/2019   Procedure:  INSERTION PORT-A-CATH WITH POSSIBLE ULTRASOUND;  Surgeon: Jovita Kussmaul, MD;  Location: [facility] ORS;  Service: General;  Laterality: N/A;  . RE-EXCISION OF BREAST CANCER,SUPERIOR MARGINS Left 02/15/2019   Procedure: RE-EXCISION OF LEFT BREAST CANCER ANTERIOR MARGINS;  Surgeon: Jovita Kussmaul, MD;  Location: [facility] ORS;  Service: General;  Laterality: Left;  . THROAT SURGERY  2015  . UMBILICAL HERNIA REPAIR  2006    x 2   w mesh per pt    Social History   Socioeconomic History  . Marital status: Significant Other    Spouse name: Not on file  . Number of children: Not on file  . Years of education: Not on file  . Highest education level: Not on file  Occupational History  . Not on file  Tobacco Use  . Smoking status: Former Smoker    Packs/day: 1.00    Years: 20.00    Pack years: 20.00    Quit date: 06/02/2006    Years since quitting: 13.2  . Smokeless tobacco: Never Used  Substance and Sexual Activity  . Alcohol use: Not Currently  . Drug use: No  . Sexual activity: Yes    Partners: Male  Other Topics Concern  . 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 As of 02/26/17    Divorced.    Former smoker 20+ years quit in 1997 then started again for 5 years and quit 10-12 years ago as of 03/08/17    Social Determinants of Health   Financial Resource Strain:   . Difficulty of Paying Living Expenses:   Food Insecurity:   . Worried About Charity fundraiser in the Last Year:   . Arboriculturist in the Last Year:   Transportation Needs:   . Film/video editor (Medical):   Marland Kitchen Lack of Transportation (Non-Medical):   Physical Activity:   . Days of Exercise per Week:   . Minutes of Exercise per Session:   Stress:   . Feeling of Stress :   Social Connections:   . Frequency of Communication with Friends and Family:   . Frequency of Social Gatherings with  Friends and Family:   . Attends Religious Services:   . Active Member of Clubs or Organizations:   . Attends Archivist Meetings:   Marland Kitchen Marital Status:   Intimate Partner Violence:   . Fear of Current or Ex-Partner:   . Emotionally Abused:   Marland Kitchen Physically Abused:   . Sexually Abused:     Family History  Problem Relation Age of Onset  . Breast cancer Mother        dx late 44s  . Thyroid disease Mother   . Colon polyps Mother   . Prostate cancer Father  dx 71  . Breast cancer Maternal Grandmother        dx late 6s  . Cancer Maternal Uncle        possibly, unsure type     Current Outpatient Medications:  .  acetaminophen (TYLENOL) 500 MG tablet, Take 500-1,000 mg by mouth every 6 (six) hours as needed (for pain.)., Disp: , Rfl:  .  Cholecalciferol (VITAMIN D-3) 125 MCG (5000 UT) TABS, Take 5,000 Units by mouth daily., Disp: , Rfl:  .  Cyanocobalamin (VITAMIN B-12 PO), Take 1 tablet by mouth daily., Disp: , Rfl:  .  dexamethasone (DECADRON) 4 MG tablet, TAKE 2 TABLETS BY MOUTH ONCE A DAY ON THE DAY AFTER CHEMOTHERAPY AND THEN TAKE 2 TABLETS TWO TIMES A DAY FOR 2 DAYS. TAKE WITH FOOD., Disp: 30 tablet, Rfl: 1 .  doxycycline (VIBRA-TABS) 100 MG tablet, Take 1 tablet (100 mg total) by mouth 2 (two) times daily., Disp: 14 tablet, Rfl: 0 .  DULoxetine (CYMBALTA) 60 MG capsule, Take 1 capsule (60 mg total) by mouth daily., Disp: 30 capsule, Rfl: 0 .  fluticasone (FLONASE) 50 MCG/ACT nasal spray, Place 2 sprays into both nostrils daily. Max 2 sprays, Disp: 16 g, Rfl: 12 .  gabapentin (NEURONTIN) 300 MG capsule, Take 300 mg by mouth at bedtime., Disp: , Rfl:  .  letrozole (FEMARA) 2.5 MG tablet, Take 1 tablet (2.5 mg total) by mouth daily. Do not start med until you complete radiation (Patient not taking: Reported on 08/08/2019), Disp: 30 tablet, Rfl: 2 .  lidocaine-prilocaine (EMLA) cream, Apply to affected area once, Disp: 30 g, Rfl: 3 .  meclizine (ANTIVERT) 12.5 MG tablet,  Take 1 tablet (12.5 mg total) by mouth 3 (three) times daily as needed for dizziness., Disp: 90 tablet, Rfl: 1 .  meloxicam (MOBIC) 15 MG tablet, Take 15 mg by mouth daily., Disp: , Rfl:  .  methocarbamol (ROBAXIN) 750 MG tablet, Take 1 tablet (750 mg total) by mouth every 6 (six) hours as needed for muscle spasms., Disp: 120 tablet, Rfl: 5 .  montelukast (SINGULAIR) 10 MG tablet, Take 1 tablet (10 mg total) by mouth at bedtime., Disp: 90 tablet, Rfl: 3 .  mupirocin ointment (BACTROBAN) 2 %, Place 1 application into the nose 3 (three) times daily., Disp: 30 g, Rfl: 0 .  OLANZapine (ZYPREXA) 10 MG tablet, TAKE 1 TABLET BY MOUTH EVERYDAY AT BEDTIME, Disp: 90 tablet, Rfl: 1 .  omeprazole (PRILOSEC) 40 MG capsule, Take 40 mg by mouth every evening. , Disp: , Rfl:  .  tiZANidine (ZANAFLEX) 4 MG tablet, Take 4 mg by mouth at bedtime. , Disp: , Rfl:  .  traMADol (ULTRAM) 50 MG tablet, Take 1-2 tablets (50-100 mg total) by mouth every 6 (six) hours as needed., Disp: 20 tablet, Rfl: 0 .  traZODone (DESYREL) 50 MG tablet, Take 1-1.5 tablets (50-75 mg total) by mouth at bedtime as needed for sleep. (Patient taking differently: Take 50 mg by mouth at bedtime as needed for sleep. ), Disp: 135 tablet, Rfl: 4  Physical exam: There were no vitals filed for this visit. Physical Exam Constitutional:      Appearance: Normal appearance.  HENT:     Head: Normocephalic and atraumatic.  Eyes:     Pupils: Pupils are equal, round, and reactive to light.  Cardiovascular:     Rate and Rhythm: Normal rate and regular rhythm.     Heart sounds: Normal heart sounds. No murmur.  Pulmonary:     Effort:  Pulmonary effort is normal.     Breath sounds: Normal breath sounds. No wheezing.  Abdominal:     General: Bowel sounds are normal. There is no distension.     Palpations: Abdomen is soft.     Tenderness: There is no abdominal tenderness.  Musculoskeletal:        General: Normal range of motion.     Cervical back:  Normal range of motion.  Skin:    General: Skin is warm and dry.     Findings: Abscess, erythema and lesion present. No rash.  Neurological:     Mental Status: She is alert and oriented to person, place, and time.  Psychiatric:        Judgment: Judgment normal.    Media Information   Document Information  Photos    08/14/2019 11:39  Attached To:  Office Visit on 08/14/19 with Jacquelin Hawking, NP  Source Information  Jacquelin Hawking, NP  [facility]     CMP Latest Ref Rng & Units 08/06/2019  Glucose 70 - 99 mg/dL 114(H)  BUN 6 - 20 mg/dL 20  Creatinine 0.44 - 1.00 mg/dL 0.75  Sodium 135 - 145 mmol/L 137  Potassium 3.5 - 5.1 mmol/L 3.7  Chloride 98 - 111 mmol/L 103  CO2 22 - 32 mmol/L 26  Calcium 8.9 - 10.3 mg/dL 8.7(L)  Total Protein 6.5 - 8.1 g/dL 6.2(L)  Total Bilirubin 0.3 - 1.2 mg/dL 0.8  Alkaline Phos 38 - 126 U/L 53  AST 15 - 41 U/L 23  ALT 0 - 44 U/L 29   CBC Latest Ref Rng & Units 08/06/2019  WBC 4.0 - 10.5 K/uL 6.1  Hemoglobin 12.0 - 15.0 g/dL 11.7(L)  Hematocrit 36.0 - 46.0 % 33.2(L)  Platelets 150 - 400 K/uL 291    No images are attached to the encounter.  No results found.   Assessment and plan- Patient is a 54 y.o. female who presents to symptom management for a painful abcess on left side of mons pubis.  Present for approximately 1 week.  On assessment, patient has pustule abscess that is red and painful left of mons pubis.  No fluctuant head.  Possibly developing cellulitis with erythema and hardening surrounding abcess.  Per up-to-date, recommend topical treatment with Bactroban ointment and warm compresses several times per day.  She will also need an oral antibiotic given she is immunocompromised due to recent chemo.  Plan: Rx Bactroban ointment Rx doxycycline 100 mg twice daily x7 days Apply warm compresses several times daily for comfort  Disposition: RTC as scheduled.   Visit Diagnosis 1. Folliculitis   2. Malignant neoplasm  of upper-inner quadrant of left breast in female, estrogen receptor positive ([place])     Patient expressed understanding and was in agreement with this plan. She also understands that She can call clinic at any time with any questions, concerns, or complaints.   Greater than 50% was spent in counseling and coordination of care with this patient including but not limited to discussion of the relevant topics above (See A&P) including, but not limited to diagnosis and management of acute and chronic medical conditions.   Thank you for allowing me to participate in the care of this very pleasant patient.    Jacquelin Hawking, NP [place] [facility] Cell - [phone] Pager- [phone] 08/14/2019 3:46 PM  CC: Dr. Janese Banks

## 2019-08-20 ENCOUNTER — Other Ambulatory Visit: Payer: Self-pay | Admitting: Oncology

## 2019-08-20 ENCOUNTER — Ambulatory Visit
Admission: RE | Admit: 2019-08-20 | Discharge: 2019-08-20 | Disposition: A | Discharge status: Home / Self Care | Payer: Medicaid Other | Source: Ambulatory Visit | Attending: Radiation Oncology | Admitting: Radiation Oncology

## 2019-08-20 DIAGNOSIS — Z510 Encounter for antineoplastic radiation therapy: Secondary | ICD-10-CM | POA: Diagnosis not present

## 2019-08-20 DIAGNOSIS — C50911 Malignant neoplasm of unspecified site of right female breast: Secondary | ICD-10-CM | POA: Diagnosis not present

## 2019-08-20 DIAGNOSIS — Z170 Estrogen receptor positive status [ER+]: Secondary | ICD-10-CM | POA: Diagnosis not present

## 2019-08-20 DIAGNOSIS — C50212 Malignant neoplasm of upper-inner quadrant of left female breast: Secondary | ICD-10-CM | POA: Diagnosis not present

## 2019-08-21 DIAGNOSIS — C50212 Malignant neoplasm of upper-inner quadrant of left female breast: Secondary | ICD-10-CM | POA: Diagnosis not present

## 2019-08-21 DIAGNOSIS — C50911 Malignant neoplasm of unspecified site of right female breast: Secondary | ICD-10-CM | POA: Diagnosis not present

## 2019-08-21 DIAGNOSIS — Z170 Estrogen receptor positive status [ER+]: Secondary | ICD-10-CM | POA: Diagnosis not present

## 2019-08-21 DIAGNOSIS — Z510 Encounter for antineoplastic radiation therapy: Secondary | ICD-10-CM | POA: Diagnosis not present

## 2019-08-22 ENCOUNTER — Ambulatory Visit: Payer: Medicaid Other | Admitting: Psychology

## 2019-08-23 ENCOUNTER — Other Ambulatory Visit: Payer: Self-pay | Admitting: *Deleted

## 2019-08-23 DIAGNOSIS — C50212 Malignant neoplasm of upper-inner quadrant of left female breast: Secondary | ICD-10-CM

## 2019-08-27 ENCOUNTER — Ambulatory Visit: Admission: RE | Admit: 2019-08-27 | Payer: Medicaid Other | Source: Ambulatory Visit

## 2019-08-27 DIAGNOSIS — Z510 Encounter for antineoplastic radiation therapy: Secondary | ICD-10-CM | POA: Diagnosis not present

## 2019-08-27 DIAGNOSIS — C50911 Malignant neoplasm of unspecified site of right female breast: Secondary | ICD-10-CM | POA: Diagnosis not present

## 2019-08-27 DIAGNOSIS — Z170 Estrogen receptor positive status [ER+]: Secondary | ICD-10-CM | POA: Diagnosis not present

## 2019-08-27 DIAGNOSIS — C50212 Malignant neoplasm of upper-inner quadrant of left female breast: Secondary | ICD-10-CM | POA: Diagnosis not present

## 2019-08-28 ENCOUNTER — Ambulatory Visit
Admission: RE | Admit: 2019-08-28 | Discharge: 2019-08-28 | Disposition: A | Discharge status: Home / Self Care | Payer: Medicaid Other | Source: Ambulatory Visit | Attending: Radiation Oncology | Admitting: Radiation Oncology

## 2019-08-28 DIAGNOSIS — Z510 Encounter for antineoplastic radiation therapy: Secondary | ICD-10-CM | POA: Diagnosis not present

## 2019-08-28 DIAGNOSIS — Z170 Estrogen receptor positive status [ER+]: Secondary | ICD-10-CM | POA: Diagnosis not present

## 2019-08-28 DIAGNOSIS — C50911 Malignant neoplasm of unspecified site of right female breast: Secondary | ICD-10-CM | POA: Diagnosis not present

## 2019-08-29 ENCOUNTER — Ambulatory Visit
Admission: RE | Admit: 2019-08-29 | Discharge: 2019-08-29 | Disposition: A | Discharge status: Home / Self Care | Payer: Medicaid Other | Source: Ambulatory Visit | Attending: Radiation Oncology | Admitting: Radiation Oncology

## 2019-08-29 DIAGNOSIS — Z510 Encounter for antineoplastic radiation therapy: Secondary | ICD-10-CM | POA: Diagnosis not present

## 2019-08-29 DIAGNOSIS — C50911 Malignant neoplasm of unspecified site of right female breast: Secondary | ICD-10-CM | POA: Diagnosis not present

## 2019-08-29 DIAGNOSIS — C50212 Malignant neoplasm of upper-inner quadrant of left female breast: Secondary | ICD-10-CM | POA: Diagnosis not present

## 2019-08-29 DIAGNOSIS — Z170 Estrogen receptor positive status [ER+]: Secondary | ICD-10-CM | POA: Diagnosis not present

## 2019-08-30 ENCOUNTER — Ambulatory Visit
Admission: RE | Admit: 2019-08-30 | Discharge: 2019-08-30 | Disposition: A | Discharge status: Home / Self Care | Payer: Medicaid Other | Source: Ambulatory Visit | Attending: Radiation Oncology | Admitting: Radiation Oncology

## 2019-08-30 DIAGNOSIS — C50212 Malignant neoplasm of upper-inner quadrant of left female breast: Secondary | ICD-10-CM | POA: Diagnosis not present

## 2019-08-30 DIAGNOSIS — Z170 Estrogen receptor positive status [ER+]: Secondary | ICD-10-CM | POA: Diagnosis not present

## 2019-08-30 DIAGNOSIS — C50911 Malignant neoplasm of unspecified site of right female breast: Secondary | ICD-10-CM | POA: Diagnosis not present

## 2019-08-30 DIAGNOSIS — Z510 Encounter for antineoplastic radiation therapy: Secondary | ICD-10-CM | POA: Diagnosis not present

## 2019-09-02 ENCOUNTER — Ambulatory Visit
Admission: RE | Admit: 2019-09-02 | Discharge: 2019-09-02 | Disposition: A | Discharge status: Home / Self Care | Payer: Medicaid Other | Source: Ambulatory Visit | Attending: Radiation Oncology | Admitting: Radiation Oncology

## 2019-09-02 DIAGNOSIS — Z510 Encounter for antineoplastic radiation therapy: Secondary | ICD-10-CM | POA: Diagnosis not present

## 2019-09-02 DIAGNOSIS — C50212 Malignant neoplasm of upper-inner quadrant of left female breast: Secondary | ICD-10-CM | POA: Diagnosis not present

## 2019-09-02 DIAGNOSIS — C50911 Malignant neoplasm of unspecified site of right female breast: Secondary | ICD-10-CM | POA: Diagnosis not present

## 2019-09-02 DIAGNOSIS — Z170 Estrogen receptor positive status [ER+]: Secondary | ICD-10-CM | POA: Diagnosis not present

## 2019-09-03 ENCOUNTER — Ambulatory Visit
Admission: RE | Admit: 2019-09-03 | Discharge: 2019-09-03 | Disposition: A | Discharge status: Home / Self Care | Payer: Medicaid Other | Source: Ambulatory Visit | Attending: Radiation Oncology | Admitting: Radiation Oncology

## 2019-09-03 DIAGNOSIS — Z170 Estrogen receptor positive status [ER+]: Secondary | ICD-10-CM | POA: Diagnosis not present

## 2019-09-03 DIAGNOSIS — C50911 Malignant neoplasm of unspecified site of right female breast: Secondary | ICD-10-CM | POA: Diagnosis not present

## 2019-09-03 DIAGNOSIS — C50212 Malignant neoplasm of upper-inner quadrant of left female breast: Secondary | ICD-10-CM | POA: Diagnosis not present

## 2019-09-03 DIAGNOSIS — Z510 Encounter for antineoplastic radiation therapy: Secondary | ICD-10-CM | POA: Diagnosis not present

## 2019-09-04 ENCOUNTER — Ambulatory Visit
Admission: RE | Admit: 2019-09-04 | Discharge: 2019-09-04 | Disposition: A | Discharge status: Home / Self Care | Payer: Medicaid Other | Source: Ambulatory Visit | Attending: Radiation Oncology | Admitting: Radiation Oncology

## 2019-09-04 DIAGNOSIS — C50212 Malignant neoplasm of upper-inner quadrant of left female breast: Secondary | ICD-10-CM | POA: Diagnosis not present

## 2019-09-04 DIAGNOSIS — Z170 Estrogen receptor positive status [ER+]: Secondary | ICD-10-CM | POA: Diagnosis not present

## 2019-09-04 DIAGNOSIS — C50911 Malignant neoplasm of unspecified site of right female breast: Secondary | ICD-10-CM | POA: Diagnosis not present

## 2019-09-04 DIAGNOSIS — Z510 Encounter for antineoplastic radiation therapy: Secondary | ICD-10-CM | POA: Diagnosis not present

## 2019-09-05 ENCOUNTER — Ambulatory Visit
Admission: RE | Admit: 2019-09-05 | Discharge: 2019-09-05 | Disposition: A | Discharge status: Home / Self Care | Payer: Medicaid Other | Source: Ambulatory Visit | Attending: Radiation Oncology | Admitting: Radiation Oncology

## 2019-09-05 DIAGNOSIS — Z170 Estrogen receptor positive status [ER+]: Secondary | ICD-10-CM | POA: Diagnosis not present

## 2019-09-05 DIAGNOSIS — Z510 Encounter for antineoplastic radiation therapy: Secondary | ICD-10-CM | POA: Diagnosis not present

## 2019-09-05 DIAGNOSIS — C50911 Malignant neoplasm of unspecified site of right female breast: Secondary | ICD-10-CM | POA: Diagnosis not present

## 2019-09-06 ENCOUNTER — Ambulatory Visit
Admission: RE | Admit: 2019-09-06 | Discharge: 2019-09-06 | Disposition: A | Discharge status: Home / Self Care | Payer: Medicaid Other | Source: Ambulatory Visit | Attending: Radiation Oncology | Admitting: Radiation Oncology

## 2019-09-06 DIAGNOSIS — C50911 Malignant neoplasm of unspecified site of right female breast: Secondary | ICD-10-CM | POA: Diagnosis not present

## 2019-09-06 DIAGNOSIS — Z510 Encounter for antineoplastic radiation therapy: Secondary | ICD-10-CM | POA: Diagnosis not present

## 2019-09-06 DIAGNOSIS — Z170 Estrogen receptor positive status [ER+]: Secondary | ICD-10-CM | POA: Diagnosis not present

## 2019-09-09 ENCOUNTER — Ambulatory Visit
Admission: RE | Admit: 2019-09-09 | Discharge: 2019-09-09 | Disposition: A | Discharge status: Home / Self Care | Payer: Medicaid Other | Source: Ambulatory Visit | Attending: Radiation Oncology | Admitting: Radiation Oncology

## 2019-09-09 DIAGNOSIS — C50212 Malignant neoplasm of upper-inner quadrant of left female breast: Secondary | ICD-10-CM | POA: Diagnosis not present

## 2019-09-09 DIAGNOSIS — Z510 Encounter for antineoplastic radiation therapy: Secondary | ICD-10-CM | POA: Diagnosis not present

## 2019-09-09 DIAGNOSIS — Z170 Estrogen receptor positive status [ER+]: Secondary | ICD-10-CM | POA: Diagnosis not present

## 2019-09-09 DIAGNOSIS — C50911 Malignant neoplasm of unspecified site of right female breast: Secondary | ICD-10-CM | POA: Diagnosis not present

## 2019-09-10 ENCOUNTER — Ambulatory Visit
Admission: RE | Admit: 2019-09-10 | Discharge: 2019-09-10 | Disposition: A | Discharge status: Home / Self Care | Payer: Medicaid Other | Source: Ambulatory Visit | Attending: Radiation Oncology | Admitting: Radiation Oncology

## 2019-09-10 ENCOUNTER — Inpatient Hospital Stay: Payer: Medicaid Other

## 2019-09-10 ENCOUNTER — Other Ambulatory Visit: Payer: Self-pay

## 2019-09-10 DIAGNOSIS — F329 Major depressive disorder, single episode, unspecified: Secondary | ICD-10-CM | POA: Diagnosis not present

## 2019-09-10 DIAGNOSIS — Z170 Estrogen receptor positive status [ER+]: Secondary | ICD-10-CM | POA: Diagnosis not present

## 2019-09-10 DIAGNOSIS — E785 Hyperlipidemia, unspecified: Secondary | ICD-10-CM | POA: Diagnosis not present

## 2019-09-10 DIAGNOSIS — Z79811 Long term (current) use of aromatase inhibitors: Secondary | ICD-10-CM | POA: Diagnosis not present

## 2019-09-10 DIAGNOSIS — C50212 Malignant neoplasm of upper-inner quadrant of left female breast: Secondary | ICD-10-CM | POA: Diagnosis not present

## 2019-09-10 DIAGNOSIS — C50911 Malignant neoplasm of unspecified site of right female breast: Secondary | ICD-10-CM | POA: Diagnosis not present

## 2019-09-10 DIAGNOSIS — Z79899 Other long term (current) drug therapy: Secondary | ICD-10-CM | POA: Diagnosis not present

## 2019-09-10 DIAGNOSIS — Z7952 Long term (current) use of systemic steroids: Secondary | ICD-10-CM | POA: Diagnosis not present

## 2019-09-10 DIAGNOSIS — Z9221 Personal history of antineoplastic chemotherapy: Secondary | ICD-10-CM | POA: Diagnosis not present

## 2019-09-10 DIAGNOSIS — J45909 Unspecified asthma, uncomplicated: Secondary | ICD-10-CM | POA: Diagnosis not present

## 2019-09-10 DIAGNOSIS — Z8042 Family history of malignant neoplasm of prostate: Secondary | ICD-10-CM | POA: Diagnosis not present

## 2019-09-10 DIAGNOSIS — Z791 Long term (current) use of non-steroidal anti-inflammatories (NSAID): Secondary | ICD-10-CM | POA: Diagnosis not present

## 2019-09-10 DIAGNOSIS — Z803 Family history of malignant neoplasm of breast: Secondary | ICD-10-CM | POA: Diagnosis not present

## 2019-09-10 DIAGNOSIS — Z87891 Personal history of nicotine dependence: Secondary | ICD-10-CM | POA: Diagnosis not present

## 2019-09-10 DIAGNOSIS — F419 Anxiety disorder, unspecified: Secondary | ICD-10-CM | POA: Diagnosis not present

## 2019-09-10 DIAGNOSIS — G629 Polyneuropathy, unspecified: Secondary | ICD-10-CM | POA: Diagnosis not present

## 2019-09-10 DIAGNOSIS — L739 Follicular disorder, unspecified: Secondary | ICD-10-CM | POA: Diagnosis not present

## 2019-09-10 DIAGNOSIS — Z510 Encounter for antineoplastic radiation therapy: Secondary | ICD-10-CM | POA: Diagnosis not present

## 2019-09-10 LAB — CBC
HCT: 40 % (ref 36.0–46.0)
Hemoglobin: 13.8 g/dL (ref 12.0–15.0)
MCH: 31.5 pg (ref 26.0–34.0)
MCHC: 34.5 g/dL (ref 30.0–36.0)
MCV: 91.3 fL (ref 80.0–100.0)
Platelets: 313 10*3/uL (ref 150–400)
RBC: 4.38 MIL/uL (ref 3.87–5.11)
RDW: 12.7 % (ref 11.5–15.5)
WBC: 5.4 10*3/uL (ref 4.0–10.5)
nRBC: 0 % (ref 0.0–0.2)

## 2019-09-11 ENCOUNTER — Ambulatory Visit
Admission: RE | Admit: 2019-09-11 | Discharge: 2019-09-11 | Disposition: A | Discharge status: Home / Self Care | Payer: Medicaid Other | Source: Ambulatory Visit | Attending: Radiation Oncology | Admitting: Radiation Oncology

## 2019-09-11 DIAGNOSIS — C50911 Malignant neoplasm of unspecified site of right female breast: Secondary | ICD-10-CM | POA: Diagnosis not present

## 2019-09-11 DIAGNOSIS — Z510 Encounter for antineoplastic radiation therapy: Secondary | ICD-10-CM | POA: Diagnosis not present

## 2019-09-11 DIAGNOSIS — Z170 Estrogen receptor positive status [ER+]: Secondary | ICD-10-CM | POA: Diagnosis not present

## 2019-09-12 ENCOUNTER — Ambulatory Visit
Admission: RE | Admit: 2019-09-12 | Discharge: 2019-09-12 | Disposition: A | Discharge status: Home / Self Care | Payer: Medicaid Other | Source: Ambulatory Visit | Attending: Radiation Oncology | Admitting: Radiation Oncology

## 2019-09-12 DIAGNOSIS — Z510 Encounter for antineoplastic radiation therapy: Secondary | ICD-10-CM | POA: Insufficient documentation

## 2019-09-12 DIAGNOSIS — C50911 Malignant neoplasm of unspecified site of right female breast: Secondary | ICD-10-CM | POA: Diagnosis not present

## 2019-09-12 DIAGNOSIS — Z170 Estrogen receptor positive status [ER+]: Secondary | ICD-10-CM | POA: Insufficient documentation

## 2019-09-13 ENCOUNTER — Ambulatory Visit
Admission: RE | Admit: 2019-09-13 | Discharge: 2019-09-13 | Disposition: A | Discharge status: Home / Self Care | Payer: Medicaid Other | Source: Ambulatory Visit | Attending: Radiation Oncology | Admitting: Radiation Oncology

## 2019-09-13 DIAGNOSIS — C50911 Malignant neoplasm of unspecified site of right female breast: Secondary | ICD-10-CM | POA: Diagnosis not present

## 2019-09-13 DIAGNOSIS — Z170 Estrogen receptor positive status [ER+]: Secondary | ICD-10-CM | POA: Diagnosis not present

## 2019-09-13 DIAGNOSIS — Z510 Encounter for antineoplastic radiation therapy: Secondary | ICD-10-CM | POA: Diagnosis not present

## 2019-09-17 ENCOUNTER — Ambulatory Visit
Admission: RE | Admit: 2019-09-17 | Discharge: 2019-09-17 | Disposition: A | Discharge status: Home / Self Care | Payer: Medicaid Other | Source: Ambulatory Visit | Attending: Radiation Oncology | Admitting: Radiation Oncology

## 2019-09-17 DIAGNOSIS — C50911 Malignant neoplasm of unspecified site of right female breast: Secondary | ICD-10-CM | POA: Diagnosis not present

## 2019-09-17 DIAGNOSIS — M5030 Other cervical disc degeneration, unspecified cervical region: Secondary | ICD-10-CM | POA: Diagnosis not present

## 2019-09-17 DIAGNOSIS — M5412 Radiculopathy, cervical region: Secondary | ICD-10-CM | POA: Diagnosis not present

## 2019-09-17 DIAGNOSIS — M47812 Spondylosis without myelopathy or radiculopathy, cervical region: Secondary | ICD-10-CM | POA: Diagnosis not present

## 2019-09-17 DIAGNOSIS — Z510 Encounter for antineoplastic radiation therapy: Secondary | ICD-10-CM | POA: Diagnosis not present

## 2019-09-17 DIAGNOSIS — Z170 Estrogen receptor positive status [ER+]: Secondary | ICD-10-CM | POA: Diagnosis not present

## 2019-09-18 ENCOUNTER — Ambulatory Visit
Admission: RE | Admit: 2019-09-18 | Discharge: 2019-09-18 | Disposition: A | Discharge status: Home / Self Care | Payer: Medicaid Other | Source: Ambulatory Visit | Attending: Radiation Oncology | Admitting: Radiation Oncology

## 2019-09-18 DIAGNOSIS — Z170 Estrogen receptor positive status [ER+]: Secondary | ICD-10-CM | POA: Diagnosis not present

## 2019-09-18 DIAGNOSIS — C50911 Malignant neoplasm of unspecified site of right female breast: Secondary | ICD-10-CM | POA: Diagnosis not present

## 2019-09-18 DIAGNOSIS — Z510 Encounter for antineoplastic radiation therapy: Secondary | ICD-10-CM | POA: Diagnosis not present

## 2019-09-19 ENCOUNTER — Telehealth: Payer: Self-pay | Admitting: *Deleted

## 2019-09-19 ENCOUNTER — Ambulatory Visit
Admission: RE | Admit: 2019-09-19 | Discharge: 2019-09-19 | Disposition: A | Discharge status: Home / Self Care | Payer: Medicaid Other | Source: Ambulatory Visit | Attending: Radiation Oncology | Admitting: Radiation Oncology

## 2019-09-19 DIAGNOSIS — Z510 Encounter for antineoplastic radiation therapy: Secondary | ICD-10-CM | POA: Diagnosis not present

## 2019-09-19 DIAGNOSIS — Z170 Estrogen receptor positive status [ER+]: Secondary | ICD-10-CM | POA: Diagnosis not present

## 2019-09-19 DIAGNOSIS — C50911 Malignant neoplasm of unspecified site of right female breast: Secondary | ICD-10-CM | POA: Diagnosis not present

## 2019-09-19 NOTE — Telephone Encounter (Signed)
yes

## 2019-09-19 NOTE — Telephone Encounter (Signed)
Call from [facility] asking if ok for patient to have a 6 day Prednisone taper. Please advise

## 2019-09-19 NOTE — Telephone Encounter (Signed)
Call returned to [facility] and advised ok to do the Prednisone taper

## 2019-09-20 ENCOUNTER — Ambulatory Visit
Admission: RE | Admit: 2019-09-20 | Discharge: 2019-09-20 | Disposition: A | Discharge status: Home / Self Care | Payer: Medicaid Other | Source: Ambulatory Visit | Attending: Radiation Oncology | Admitting: Radiation Oncology

## 2019-09-20 DIAGNOSIS — Z170 Estrogen receptor positive status [ER+]: Secondary | ICD-10-CM | POA: Diagnosis not present

## 2019-09-20 DIAGNOSIS — C50911 Malignant neoplasm of unspecified site of right female breast: Secondary | ICD-10-CM | POA: Diagnosis not present

## 2019-09-20 DIAGNOSIS — Z510 Encounter for antineoplastic radiation therapy: Secondary | ICD-10-CM | POA: Diagnosis not present

## 2019-09-23 ENCOUNTER — Ambulatory Visit
Admission: RE | Admit: 2019-09-23 | Discharge: 2019-09-23 | Disposition: A | Discharge status: Home / Self Care | Payer: Medicaid Other | Source: Ambulatory Visit | Attending: Radiation Oncology | Admitting: Radiation Oncology

## 2019-09-23 DIAGNOSIS — C50911 Malignant neoplasm of unspecified site of right female breast: Secondary | ICD-10-CM | POA: Diagnosis not present

## 2019-09-23 DIAGNOSIS — Z510 Encounter for antineoplastic radiation therapy: Secondary | ICD-10-CM | POA: Diagnosis not present

## 2019-09-23 DIAGNOSIS — Z170 Estrogen receptor positive status [ER+]: Secondary | ICD-10-CM | POA: Diagnosis not present

## 2019-09-24 ENCOUNTER — Inpatient Hospital Stay: Payer: Medicaid Other | Attending: Oncology

## 2019-09-24 ENCOUNTER — Other Ambulatory Visit: Payer: Self-pay

## 2019-09-24 ENCOUNTER — Ambulatory Visit
Admission: RE | Admit: 2019-09-24 | Discharge: 2019-09-24 | Disposition: A | Discharge status: Home / Self Care | Payer: Medicaid Other | Source: Ambulatory Visit | Attending: Radiation Oncology | Admitting: Radiation Oncology

## 2019-09-24 DIAGNOSIS — Z510 Encounter for antineoplastic radiation therapy: Secondary | ICD-10-CM | POA: Diagnosis not present

## 2019-09-24 DIAGNOSIS — Z95828 Presence of other vascular implants and grafts: Secondary | ICD-10-CM

## 2019-09-24 DIAGNOSIS — Z170 Estrogen receptor positive status [ER+]: Secondary | ICD-10-CM | POA: Diagnosis not present

## 2019-09-24 DIAGNOSIS — C50911 Malignant neoplasm of unspecified site of right female breast: Secondary | ICD-10-CM | POA: Diagnosis not present

## 2019-09-24 DIAGNOSIS — Z452 Encounter for adjustment and management of vascular access device: Secondary | ICD-10-CM | POA: Insufficient documentation

## 2019-09-24 DIAGNOSIS — C50212 Malignant neoplasm of upper-inner quadrant of left female breast: Secondary | ICD-10-CM | POA: Diagnosis present

## 2019-09-24 LAB — CBC
HCT: 38.3 % (ref 36.0–46.0)
Hemoglobin: 13.9 g/dL (ref 12.0–15.0)
MCH: 32.3 pg (ref 26.0–34.0)
MCHC: 36.3 g/dL — ABNORMAL HIGH (ref 30.0–36.0)
MCV: 88.9 fL (ref 80.0–100.0)
Platelets: 300 10*3/uL (ref 150–400)
RBC: 4.31 MIL/uL (ref 3.87–5.11)
RDW: 12.2 % (ref 11.5–15.5)
WBC: 8.1 10*3/uL (ref 4.0–10.5)
nRBC: 0 % (ref 0.0–0.2)

## 2019-09-24 MED ORDER — SODIUM CHLORIDE 0.9% FLUSH
10.0000 mL | INTRAVENOUS | Status: DC | PRN
  Administered 2019-09-24: 10 mL via INTRAVENOUS
  Filled 2019-09-24: qty 10

## 2019-09-24 MED ORDER — HEPARIN SOD (PORK) LOCK FLUSH 100 UNIT/ML IV SOLN
INTRAVENOUS | Status: AC
  Filled 2019-09-24: qty 5

## 2019-09-24 MED ORDER — HEPARIN SOD (PORK) LOCK FLUSH 100 UNIT/ML IV SOLN
500.0000 [IU] | Freq: Once | INTRAVENOUS | Status: AC
  Administered 2019-09-24: 500 [IU] via INTRAVENOUS
  Filled 2019-09-24: qty 5

## 2019-09-25 ENCOUNTER — Ambulatory Visit
Admission: RE | Admit: 2019-09-25 | Discharge: 2019-09-25 | Disposition: A | Discharge status: Home / Self Care | Payer: Medicaid Other | Source: Ambulatory Visit | Attending: Radiation Oncology | Admitting: Radiation Oncology

## 2019-09-25 DIAGNOSIS — Z510 Encounter for antineoplastic radiation therapy: Secondary | ICD-10-CM | POA: Diagnosis not present

## 2019-09-25 DIAGNOSIS — C50911 Malignant neoplasm of unspecified site of right female breast: Secondary | ICD-10-CM | POA: Diagnosis not present

## 2019-09-25 DIAGNOSIS — Z170 Estrogen receptor positive status [ER+]: Secondary | ICD-10-CM | POA: Diagnosis not present

## 2019-09-26 ENCOUNTER — Ambulatory Visit
Admission: RE | Admit: 2019-09-26 | Discharge: 2019-09-26 | Disposition: A | Discharge status: Home / Self Care | Payer: Medicaid Other | Source: Ambulatory Visit | Attending: Radiation Oncology | Admitting: Radiation Oncology

## 2019-09-26 DIAGNOSIS — Z510 Encounter for antineoplastic radiation therapy: Secondary | ICD-10-CM | POA: Diagnosis not present

## 2019-09-26 DIAGNOSIS — C50911 Malignant neoplasm of unspecified site of right female breast: Secondary | ICD-10-CM | POA: Diagnosis not present

## 2019-09-26 DIAGNOSIS — Z170 Estrogen receptor positive status [ER+]: Secondary | ICD-10-CM | POA: Diagnosis not present

## 2019-09-27 ENCOUNTER — Ambulatory Visit
Admission: RE | Admit: 2019-09-27 | Discharge: 2019-09-27 | Disposition: A | Discharge status: Home / Self Care | Payer: Medicaid Other | Source: Ambulatory Visit | Attending: Radiation Oncology | Admitting: Radiation Oncology

## 2019-09-27 DIAGNOSIS — Z170 Estrogen receptor positive status [ER+]: Secondary | ICD-10-CM | POA: Diagnosis not present

## 2019-09-27 DIAGNOSIS — C50911 Malignant neoplasm of unspecified site of right female breast: Secondary | ICD-10-CM | POA: Diagnosis not present

## 2019-09-27 DIAGNOSIS — Z510 Encounter for antineoplastic radiation therapy: Secondary | ICD-10-CM | POA: Diagnosis not present

## 2019-09-30 ENCOUNTER — Ambulatory Visit
Admission: RE | Admit: 2019-09-30 | Discharge: 2019-09-30 | Disposition: A | Discharge status: Home / Self Care | Payer: Medicaid Other | Source: Ambulatory Visit | Attending: Radiation Oncology | Admitting: Radiation Oncology

## 2019-09-30 DIAGNOSIS — C50911 Malignant neoplasm of unspecified site of right female breast: Secondary | ICD-10-CM | POA: Diagnosis not present

## 2019-09-30 DIAGNOSIS — Z170 Estrogen receptor positive status [ER+]: Secondary | ICD-10-CM | POA: Diagnosis not present

## 2019-09-30 DIAGNOSIS — Z510 Encounter for antineoplastic radiation therapy: Secondary | ICD-10-CM | POA: Diagnosis not present

## 2019-09-30 DIAGNOSIS — C50212 Malignant neoplasm of upper-inner quadrant of left female breast: Secondary | ICD-10-CM | POA: Diagnosis not present

## 2019-10-01 ENCOUNTER — Ambulatory Visit
Admission: RE | Admit: 2019-10-01 | Discharge: 2019-10-01 | Disposition: A | Discharge status: Home / Self Care | Payer: Medicaid Other | Source: Ambulatory Visit | Attending: Radiation Oncology | Admitting: Radiation Oncology

## 2019-10-01 DIAGNOSIS — C50212 Malignant neoplasm of upper-inner quadrant of left female breast: Secondary | ICD-10-CM | POA: Diagnosis not present

## 2019-10-01 DIAGNOSIS — Z510 Encounter for antineoplastic radiation therapy: Secondary | ICD-10-CM | POA: Diagnosis not present

## 2019-10-01 DIAGNOSIS — C50911 Malignant neoplasm of unspecified site of right female breast: Secondary | ICD-10-CM | POA: Diagnosis not present

## 2019-10-01 DIAGNOSIS — Z170 Estrogen receptor positive status [ER+]: Secondary | ICD-10-CM | POA: Diagnosis not present

## 2019-10-02 ENCOUNTER — Ambulatory Visit
Admission: RE | Admit: 2019-10-02 | Discharge: 2019-10-02 | Disposition: A | Discharge status: Home / Self Care | Payer: Medicaid Other | Source: Ambulatory Visit | Attending: Radiation Oncology | Admitting: Radiation Oncology

## 2019-10-02 DIAGNOSIS — Z510 Encounter for antineoplastic radiation therapy: Secondary | ICD-10-CM | POA: Diagnosis not present

## 2019-10-02 DIAGNOSIS — Z170 Estrogen receptor positive status [ER+]: Secondary | ICD-10-CM | POA: Diagnosis not present

## 2019-10-02 DIAGNOSIS — C50212 Malignant neoplasm of upper-inner quadrant of left female breast: Secondary | ICD-10-CM | POA: Diagnosis not present

## 2019-10-02 DIAGNOSIS — C50911 Malignant neoplasm of unspecified site of right female breast: Secondary | ICD-10-CM | POA: Diagnosis not present

## 2019-10-03 ENCOUNTER — Ambulatory Visit
Admission: RE | Admit: 2019-10-03 | Discharge: 2019-10-03 | Disposition: A | Discharge status: Home / Self Care | Payer: Medicaid Other | Source: Ambulatory Visit | Attending: Radiation Oncology | Admitting: Radiation Oncology

## 2019-10-03 DIAGNOSIS — C50911 Malignant neoplasm of unspecified site of right female breast: Secondary | ICD-10-CM | POA: Diagnosis not present

## 2019-10-03 DIAGNOSIS — Z510 Encounter for antineoplastic radiation therapy: Secondary | ICD-10-CM | POA: Diagnosis not present

## 2019-10-03 DIAGNOSIS — Z170 Estrogen receptor positive status [ER+]: Secondary | ICD-10-CM | POA: Diagnosis not present

## 2019-10-04 ENCOUNTER — Ambulatory Visit
Admission: RE | Admit: 2019-10-04 | Discharge: 2019-10-04 | Disposition: A | Discharge status: Home / Self Care | Payer: Medicaid Other | Source: Ambulatory Visit | Attending: Radiation Oncology | Admitting: Radiation Oncology

## 2019-10-04 DIAGNOSIS — C50911 Malignant neoplasm of unspecified site of right female breast: Secondary | ICD-10-CM | POA: Diagnosis not present

## 2019-10-04 DIAGNOSIS — Z510 Encounter for antineoplastic radiation therapy: Secondary | ICD-10-CM | POA: Diagnosis not present

## 2019-10-04 DIAGNOSIS — C50212 Malignant neoplasm of upper-inner quadrant of left female breast: Secondary | ICD-10-CM | POA: Diagnosis not present

## 2019-10-04 DIAGNOSIS — Z170 Estrogen receptor positive status [ER+]: Secondary | ICD-10-CM | POA: Diagnosis not present

## 2019-10-07 ENCOUNTER — Ambulatory Visit
Admission: RE | Admit: 2019-10-07 | Discharge: 2019-10-07 | Disposition: A | Discharge status: Home / Self Care | Payer: Medicaid Other | Source: Ambulatory Visit | Attending: Radiation Oncology | Admitting: Radiation Oncology

## 2019-10-07 DIAGNOSIS — C50212 Malignant neoplasm of upper-inner quadrant of left female breast: Secondary | ICD-10-CM | POA: Diagnosis not present

## 2019-10-07 DIAGNOSIS — Z170 Estrogen receptor positive status [ER+]: Secondary | ICD-10-CM | POA: Diagnosis not present

## 2019-10-07 DIAGNOSIS — Z510 Encounter for antineoplastic radiation therapy: Secondary | ICD-10-CM | POA: Diagnosis not present

## 2019-10-07 DIAGNOSIS — C50911 Malignant neoplasm of unspecified site of right female breast: Secondary | ICD-10-CM | POA: Diagnosis not present

## 2019-10-08 ENCOUNTER — Other Ambulatory Visit: Payer: Self-pay

## 2019-10-08 ENCOUNTER — Inpatient Hospital Stay: Payer: Medicaid Other

## 2019-10-08 ENCOUNTER — Other Ambulatory Visit: Payer: Self-pay | Admitting: *Deleted

## 2019-10-08 ENCOUNTER — Other Ambulatory Visit: Payer: Self-pay | Admitting: Licensed Clinical Social Worker

## 2019-10-08 ENCOUNTER — Ambulatory Visit
Admission: RE | Admit: 2019-10-08 | Discharge: 2019-10-08 | Disposition: A | Discharge status: Home / Self Care | Payer: Medicaid Other | Source: Ambulatory Visit | Attending: Radiation Oncology | Admitting: Radiation Oncology

## 2019-10-08 DIAGNOSIS — Z452 Encounter for adjustment and management of vascular access device: Secondary | ICD-10-CM | POA: Diagnosis not present

## 2019-10-08 DIAGNOSIS — Z170 Estrogen receptor positive status [ER+]: Secondary | ICD-10-CM

## 2019-10-08 DIAGNOSIS — Z510 Encounter for antineoplastic radiation therapy: Secondary | ICD-10-CM | POA: Diagnosis not present

## 2019-10-08 DIAGNOSIS — C50911 Malignant neoplasm of unspecified site of right female breast: Secondary | ICD-10-CM | POA: Diagnosis not present

## 2019-10-08 DIAGNOSIS — C50212 Malignant neoplasm of upper-inner quadrant of left female breast: Secondary | ICD-10-CM

## 2019-10-08 LAB — CBC WITH DIFFERENTIAL/PLATELET
Abs Immature Granulocytes: 0.02 10*3/uL (ref 0.00–0.07)
Basophils Absolute: 0 10*3/uL (ref 0.0–0.1)
Basophils Relative: 1 %
Eosinophils Absolute: 0.1 10*3/uL (ref 0.0–0.5)
Eosinophils Relative: 2 %
HCT: 40.9 % (ref 36.0–46.0)
Hemoglobin: 14.5 g/dL (ref 12.0–15.0)
Immature Granulocytes: 0 %
Lymphocytes Relative: 12 %
Lymphs Abs: 0.7 10*3/uL (ref 0.7–4.0)
MCH: 31.5 pg (ref 26.0–34.0)
MCHC: 35.5 g/dL (ref 30.0–36.0)
MCV: 88.9 fL (ref 80.0–100.0)
Monocytes Absolute: 0.8 10*3/uL (ref 0.1–1.0)
Monocytes Relative: 12 %
Neutro Abs: 4.7 10*3/uL (ref 1.7–7.7)
Neutrophils Relative %: 73 %
Platelets: 300 10*3/uL (ref 150–400)
RBC: 4.6 MIL/uL (ref 3.87–5.11)
RDW: 11.9 % (ref 11.5–15.5)
WBC: 6.4 10*3/uL (ref 4.0–10.5)
nRBC: 0 % (ref 0.0–0.2)

## 2019-10-08 MED ORDER — SILVER SULFADIAZINE 1 % EX CREA: 1.0000 "application " | TOPICAL_CREAM | Freq: Two times a day (BID) | CUTANEOUS | 2 refills | Status: DC

## 2019-10-09 ENCOUNTER — Ambulatory Visit
Admission: RE | Admit: 2019-10-09 | Discharge: 2019-10-09 | Disposition: A | Discharge status: Home / Self Care | Payer: Medicaid Other | Source: Ambulatory Visit | Attending: Radiation Oncology | Admitting: Radiation Oncology

## 2019-10-09 DIAGNOSIS — C50212 Malignant neoplasm of upper-inner quadrant of left female breast: Secondary | ICD-10-CM | POA: Diagnosis not present

## 2019-10-09 DIAGNOSIS — Z170 Estrogen receptor positive status [ER+]: Secondary | ICD-10-CM | POA: Diagnosis not present

## 2019-10-09 DIAGNOSIS — Z510 Encounter for antineoplastic radiation therapy: Secondary | ICD-10-CM | POA: Diagnosis not present

## 2019-10-09 DIAGNOSIS — C50911 Malignant neoplasm of unspecified site of right female breast: Secondary | ICD-10-CM | POA: Diagnosis not present

## 2019-10-10 ENCOUNTER — Ambulatory Visit
Admission: RE | Admit: 2019-10-10 | Discharge: 2019-10-10 | Disposition: A | Discharge status: Home / Self Care | Payer: Medicaid Other | Source: Ambulatory Visit | Attending: Radiation Oncology | Admitting: Radiation Oncology

## 2019-10-10 DIAGNOSIS — C50911 Malignant neoplasm of unspecified site of right female breast: Secondary | ICD-10-CM | POA: Diagnosis not present

## 2019-10-10 DIAGNOSIS — Z170 Estrogen receptor positive status [ER+]: Secondary | ICD-10-CM | POA: Diagnosis not present

## 2019-10-10 DIAGNOSIS — Z510 Encounter for antineoplastic radiation therapy: Secondary | ICD-10-CM | POA: Diagnosis not present

## 2019-10-11 ENCOUNTER — Ambulatory Visit
Admission: RE | Admit: 2019-10-11 | Discharge: 2019-10-11 | Disposition: A | Discharge status: Home / Self Care | Payer: Medicaid Other | Source: Ambulatory Visit | Attending: Radiation Oncology | Admitting: Radiation Oncology

## 2019-10-11 DIAGNOSIS — Z170 Estrogen receptor positive status [ER+]: Secondary | ICD-10-CM | POA: Diagnosis not present

## 2019-10-11 DIAGNOSIS — Z510 Encounter for antineoplastic radiation therapy: Secondary | ICD-10-CM | POA: Diagnosis not present

## 2019-10-11 DIAGNOSIS — C50911 Malignant neoplasm of unspecified site of right female breast: Secondary | ICD-10-CM | POA: Diagnosis not present

## 2019-10-14 ENCOUNTER — Ambulatory Visit (INDEPENDENT_AMBULATORY_CARE_PROVIDER_SITE_OTHER): Payer: Medicaid Other | Admitting: Psychology

## 2019-10-14 ENCOUNTER — Ambulatory Visit
Admission: RE | Admit: 2019-10-14 | Discharge: 2019-10-14 | Disposition: A | Discharge status: Home / Self Care | Payer: Medicaid Other | Source: Ambulatory Visit | Attending: Radiation Oncology | Admitting: Radiation Oncology

## 2019-10-14 DIAGNOSIS — F418 Other specified anxiety disorders: Secondary | ICD-10-CM

## 2019-10-14 DIAGNOSIS — Z170 Estrogen receptor positive status [ER+]: Secondary | ICD-10-CM | POA: Diagnosis not present

## 2019-10-14 DIAGNOSIS — C50911 Malignant neoplasm of unspecified site of right female breast: Secondary | ICD-10-CM | POA: Diagnosis not present

## 2019-10-14 DIAGNOSIS — Z510 Encounter for antineoplastic radiation therapy: Secondary | ICD-10-CM | POA: Diagnosis not present

## 2019-10-15 ENCOUNTER — Ambulatory Visit
Admission: RE | Admit: 2019-10-15 | Discharge: 2019-10-15 | Disposition: A | Discharge status: Home / Self Care | Payer: Medicaid Other | Source: Ambulatory Visit | Attending: Radiation Oncology | Admitting: Radiation Oncology

## 2019-10-15 DIAGNOSIS — Z510 Encounter for antineoplastic radiation therapy: Secondary | ICD-10-CM | POA: Diagnosis not present

## 2019-10-15 DIAGNOSIS — Z170 Estrogen receptor positive status [ER+]: Secondary | ICD-10-CM | POA: Diagnosis not present

## 2019-10-15 DIAGNOSIS — C50911 Malignant neoplasm of unspecified site of right female breast: Secondary | ICD-10-CM | POA: Diagnosis not present

## 2019-10-16 ENCOUNTER — Ambulatory Visit
Admission: RE | Admit: 2019-10-16 | Discharge: 2019-10-16 | Disposition: A | Discharge status: Home / Self Care | Payer: Medicaid Other | Source: Ambulatory Visit | Attending: Radiation Oncology | Admitting: Radiation Oncology

## 2019-10-16 DIAGNOSIS — C50911 Malignant neoplasm of unspecified site of right female breast: Secondary | ICD-10-CM | POA: Diagnosis not present

## 2019-10-16 DIAGNOSIS — Z510 Encounter for antineoplastic radiation therapy: Secondary | ICD-10-CM | POA: Diagnosis not present

## 2019-10-16 DIAGNOSIS — Z170 Estrogen receptor positive status [ER+]: Secondary | ICD-10-CM | POA: Diagnosis not present

## 2019-10-16 DIAGNOSIS — C50212 Malignant neoplasm of upper-inner quadrant of left female breast: Secondary | ICD-10-CM | POA: Diagnosis not present

## 2019-10-17 ENCOUNTER — Ambulatory Visit
Admission: RE | Admit: 2019-10-17 | Discharge: 2019-10-17 | Disposition: A | Discharge status: Home / Self Care | Payer: Medicaid Other | Source: Ambulatory Visit | Attending: Radiation Oncology | Admitting: Radiation Oncology

## 2019-10-17 DIAGNOSIS — Z510 Encounter for antineoplastic radiation therapy: Secondary | ICD-10-CM | POA: Diagnosis not present

## 2019-10-17 DIAGNOSIS — Z170 Estrogen receptor positive status [ER+]: Secondary | ICD-10-CM | POA: Diagnosis not present

## 2019-10-17 DIAGNOSIS — C50911 Malignant neoplasm of unspecified site of right female breast: Secondary | ICD-10-CM | POA: Diagnosis not present

## 2019-10-24 ENCOUNTER — Ambulatory Visit: Payer: Medicaid Other | Admitting: Radiation Oncology

## 2019-10-31 ENCOUNTER — Ambulatory Visit: Payer: Self-pay | Admitting: General Surgery

## 2019-10-31 DIAGNOSIS — Z170 Estrogen receptor positive status [ER+]: Secondary | ICD-10-CM | POA: Diagnosis not present

## 2019-10-31 DIAGNOSIS — C50212 Malignant neoplasm of upper-inner quadrant of left female breast: Secondary | ICD-10-CM | POA: Diagnosis not present

## 2019-11-01 ENCOUNTER — Other Ambulatory Visit: Payer: Self-pay | Admitting: General Surgery

## 2019-11-01 DIAGNOSIS — Z171 Estrogen receptor negative status [ER-]: Secondary | ICD-10-CM

## 2019-11-01 DIAGNOSIS — C50212 Malignant neoplasm of upper-inner quadrant of left female breast: Secondary | ICD-10-CM

## 2019-11-05 ENCOUNTER — Ambulatory Visit
Admission: RE | Admit: 2019-11-05 | Discharge: 2019-11-05 | Disposition: A | Discharge status: Home / Self Care | Payer: Medicaid Other | Source: Ambulatory Visit | Attending: Oncology | Admitting: Oncology

## 2019-11-05 ENCOUNTER — Other Ambulatory Visit: Payer: Self-pay

## 2019-11-05 DIAGNOSIS — Z170 Estrogen receptor positive status [ER+]: Secondary | ICD-10-CM | POA: Diagnosis not present

## 2019-11-05 DIAGNOSIS — M85852 Other specified disorders of bone density and structure, left thigh: Secondary | ICD-10-CM | POA: Diagnosis not present

## 2019-11-05 DIAGNOSIS — Z853 Personal history of malignant neoplasm of breast: Secondary | ICD-10-CM | POA: Diagnosis not present

## 2019-11-05 DIAGNOSIS — C50212 Malignant neoplasm of upper-inner quadrant of left female breast: Secondary | ICD-10-CM | POA: Insufficient documentation

## 2019-11-05 DIAGNOSIS — Z780 Asymptomatic menopausal state: Secondary | ICD-10-CM | POA: Diagnosis not present

## 2019-11-05 HISTORY — DX: Personal history of antineoplastic chemotherapy: Z92.21

## 2019-11-15 ENCOUNTER — Ambulatory Visit
Admission: RE | Admit: 2019-11-15 | Discharge: 2019-11-15 | Disposition: A | Discharge status: Home / Self Care | Payer: Medicaid Other | Source: Ambulatory Visit | Attending: General Surgery | Admitting: General Surgery

## 2019-11-15 DIAGNOSIS — Z171 Estrogen receptor negative status [ER-]: Secondary | ICD-10-CM | POA: Diagnosis not present

## 2019-11-15 DIAGNOSIS — C50212 Malignant neoplasm of upper-inner quadrant of left female breast: Secondary | ICD-10-CM | POA: Diagnosis not present

## 2019-11-15 DIAGNOSIS — Z853 Personal history of malignant neoplasm of breast: Secondary | ICD-10-CM | POA: Diagnosis not present

## 2019-11-15 DIAGNOSIS — R922 Inconclusive mammogram: Secondary | ICD-10-CM | POA: Diagnosis not present

## 2019-11-15 HISTORY — DX: Personal history of irradiation: Z92.3

## 2019-11-20 ENCOUNTER — Other Ambulatory Visit: Payer: Self-pay

## 2019-11-20 DIAGNOSIS — G4700 Insomnia, unspecified: Secondary | ICD-10-CM

## 2019-11-20 NOTE — Telephone Encounter (Signed)
Pended for your approval or denial.  Patient last seen 07/2019.

## 2019-11-22 NOTE — Telephone Encounter (Signed)
Pt called to check on refill.

## 2019-11-24 ENCOUNTER — Other Ambulatory Visit: Payer: Self-pay | Admitting: Internal Medicine

## 2019-11-24 DIAGNOSIS — G4700 Insomnia, unspecified: Secondary | ICD-10-CM

## 2019-11-24 MED ORDER — TRAZODONE HCL 50 MG PO TABS: 50.0000 mg | ORAL_TABLET | Freq: Every evening | ORAL | 4 refills | Status: DC | PRN

## 2019-11-25 ENCOUNTER — Encounter: Payer: Self-pay | Admitting: Oncology

## 2019-11-25 ENCOUNTER — Inpatient Hospital Stay: Payer: Medicaid Other

## 2019-11-25 ENCOUNTER — Other Ambulatory Visit: Payer: Self-pay

## 2019-11-25 ENCOUNTER — Inpatient Hospital Stay: Payer: Medicaid Other | Attending: Oncology | Admitting: Oncology

## 2019-11-25 VITALS — BP 122/82 | HR 81 | Temp 98.2°F | Resp 16 | Ht 63.0 in | Wt 182.9 lb

## 2019-11-25 DIAGNOSIS — M25561 Pain in right knee: Secondary | ICD-10-CM | POA: Insufficient documentation

## 2019-11-25 DIAGNOSIS — R197 Diarrhea, unspecified: Secondary | ICD-10-CM | POA: Insufficient documentation

## 2019-11-25 DIAGNOSIS — R5383 Other fatigue: Secondary | ICD-10-CM | POA: Insufficient documentation

## 2019-11-25 DIAGNOSIS — Z170 Estrogen receptor positive status [ER+]: Secondary | ICD-10-CM | POA: Diagnosis not present

## 2019-11-25 DIAGNOSIS — T451X5D Adverse effect of antineoplastic and immunosuppressive drugs, subsequent encounter: Secondary | ICD-10-CM

## 2019-11-25 DIAGNOSIS — M25562 Pain in left knee: Secondary | ICD-10-CM | POA: Insufficient documentation

## 2019-11-25 DIAGNOSIS — M25539 Pain in unspecified wrist: Secondary | ICD-10-CM | POA: Insufficient documentation

## 2019-11-25 DIAGNOSIS — Z79811 Long term (current) use of aromatase inhibitors: Secondary | ICD-10-CM | POA: Insufficient documentation

## 2019-11-25 DIAGNOSIS — Z87891 Personal history of nicotine dependence: Secondary | ICD-10-CM | POA: Diagnosis not present

## 2019-11-25 DIAGNOSIS — G620 Drug-induced polyneuropathy: Secondary | ICD-10-CM | POA: Diagnosis not present

## 2019-11-25 DIAGNOSIS — Z803 Family history of malignant neoplasm of breast: Secondary | ICD-10-CM | POA: Insufficient documentation

## 2019-11-25 DIAGNOSIS — Z923 Personal history of irradiation: Secondary | ICD-10-CM | POA: Insufficient documentation

## 2019-11-25 DIAGNOSIS — Z9221 Personal history of antineoplastic chemotherapy: Secondary | ICD-10-CM | POA: Diagnosis not present

## 2019-11-25 DIAGNOSIS — Z8042 Family history of malignant neoplasm of prostate: Secondary | ICD-10-CM | POA: Insufficient documentation

## 2019-11-25 DIAGNOSIS — Z8719 Personal history of other diseases of the digestive system: Secondary | ICD-10-CM | POA: Insufficient documentation

## 2019-11-25 DIAGNOSIS — C50212 Malignant neoplasm of upper-inner quadrant of left female breast: Secondary | ICD-10-CM | POA: Insufficient documentation

## 2019-11-25 LAB — CBC WITH DIFFERENTIAL/PLATELET
Abs Immature Granulocytes: 0.03 10*3/uL (ref 0.00–0.07)
Basophils Absolute: 0.1 10*3/uL (ref 0.0–0.1)
Basophils Relative: 1 %
Eosinophils Absolute: 0.1 10*3/uL (ref 0.0–0.5)
Eosinophils Relative: 2 %
HCT: 39.5 % (ref 36.0–46.0)
Hemoglobin: 14.3 g/dL (ref 12.0–15.0)
Immature Granulocytes: 0 %
Lymphocytes Relative: 21 %
Lymphs Abs: 1.5 10*3/uL (ref 0.7–4.0)
MCH: 31.6 pg (ref 26.0–34.0)
MCHC: 36.2 g/dL — ABNORMAL HIGH (ref 30.0–36.0)
MCV: 87.2 fL (ref 80.0–100.0)
Monocytes Absolute: 0.8 10*3/uL (ref 0.1–1.0)
Monocytes Relative: 11 %
Neutro Abs: 4.7 10*3/uL (ref 1.7–7.7)
Neutrophils Relative %: 65 %
Platelets: 331 10*3/uL (ref 150–400)
RBC: 4.53 MIL/uL (ref 3.87–5.11)
RDW: 12.5 % (ref 11.5–15.5)
WBC: 7.3 10*3/uL (ref 4.0–10.5)
nRBC: 0 % (ref 0.0–0.2)

## 2019-11-25 LAB — COMPREHENSIVE METABOLIC PANEL
ALT: 20 U/L (ref 0–44)
AST: 19 U/L (ref 15–41)
Albumin: 3.9 g/dL (ref 3.5–5.0)
Alkaline Phosphatase: 57 U/L (ref 38–126)
Anion gap: 12 (ref 5–15)
BUN: 18 mg/dL (ref 6–20)
CO2: 24 mmol/L (ref 22–32)
Calcium: 9.3 mg/dL (ref 8.9–10.3)
Chloride: 103 mmol/L (ref 98–111)
Creatinine, Ser: 0.71 mg/dL (ref 0.44–1.00)
GFR calc Af Amer: >60 mL/min (ref >60)
GFR calc non Af Amer: >60 mL/min (ref >60)
Glucose, Bld: 108 mg/dL — ABNORMAL HIGH (ref 70–99)
Potassium: 3.7 mmol/L (ref 3.5–5.1)
Sodium: 139 mmol/L (ref 135–145)
Total Bilirubin: 0.7 mg/dL (ref 0.3–1.2)
Total Protein: 7.4 g/dL (ref 6.5–8.1)

## 2019-11-25 MED ORDER — SODIUM CHLORIDE 0.9 % IV SOLN
Freq: Once | INTRAVENOUS | Status: AC
  Filled 2019-11-25: qty 250

## 2019-11-25 MED ORDER — HEPARIN SOD (PORK) LOCK FLUSH 100 UNIT/ML IV SOLN
500.0000 [IU] | Freq: Once | INTRAVENOUS | Status: AC | PRN
  Administered 2019-11-25: 500 [IU]
  Filled 2019-11-25: qty 5

## 2019-11-25 MED ORDER — SODIUM CHLORIDE 0.9% FLUSH
10.0000 mL | Freq: Once | INTRAVENOUS | Status: AC
  Administered 2019-11-25: 10 mL via INTRAVENOUS
  Filled 2019-11-25: qty 10

## 2019-11-25 MED ORDER — ZOLEDRONIC ACID 4 MG/100ML IV SOLN
4.0000 mg | INTRAVENOUS | Status: DC
  Administered 2019-11-25: 4 mg via INTRAVENOUS
  Filled 2019-11-25: qty 100

## 2019-11-25 MED ORDER — ANASTROZOLE 1 MG PO TABS: 1.0000 mg | ORAL_TABLET | Freq: Every day | ORAL | 2 refills | Status: DC

## 2019-11-25 NOTE — Progress Notes (Signed)
Since pt has started the letrozole she has noticed loose stools, she has some little form and never straight watery and has 2-3 a day. She is achy and holt flashes. She feels since off chemo she feels less hot flashes.

## 2019-11-26 NOTE — Progress Notes (Signed)
Hematology/Oncology Consult note [facility]  [phone] [phone]  Patient Care Team: McLean-Scocuzza, Nino Glow, MD as PCP - General (Internal Medicine) Jovita Kussmaul, MD as Consulting Physician (General Surgery) Rico Junker, RN as Registered Nurse Sindy Guadeloupe, MD as Consulting Physician (Oncology)   Name of the patient: Leah [name]  [id number]  Aug 06, 1965   Date of visit: 11/26/19  Diagnosis- pathological prognostic stage Ia invasive mammary carcinomapT1b pN1 cM0 ER/PR positive HER-2/neu negative s/p lumpectomy  Chief complaint/ Reason for visit-routine follow-up of breast cancer on letrozole  Heme/Onc history: Patient is a 54 year old postmenopausal female G2 P2 L2 who recently underwent screening bilateral mammogram which showed area of concern in her left breast. This was followed by an ultrasound and Core biopsy. Ultrasound showed 1 x 1.1 x 0.7 cm hypoechoic mass at the 10:30 position of the left breast. Left axilla appeared normal. 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  Final pathology showed 9 mm grade 1 invasive mammary carcinoma1 out of 2 lymph nodes involved with macro metastatic carcinoma 3.5 mm. Anterior margin was focally positive for which patientunderwent reexcision surgery with negative marginsPT1BPN1a (sn)  MammaPrint score came back as high risk with a risk of recurrence of 29% at 10 years without chemotherapy  Patient completed 4 cycles of dose dense AC chemotherapy and is currently on weekly Taxol   Interval history-patient reports having significant diarrhea with letrozole.  States that she has anywhere from 3-8 loose bowel movements per day.  She had some diarrhea during chemotherapy which resolved but states that it restarted after taking letrozole.  Reports some ongoing fatigue and pain in her bilateral knees and  wrists  ECOG PS- 1 Pain scale- 3 Opioid associated constipation- no  Review of systems- Review of Systems  Constitutional: Positive for malaise/fatigue. Negative for chills, fever and weight loss.  HENT: Negative for congestion, ear discharge and nosebleeds.   Eyes: Negative for blurred vision.  Respiratory: Negative for cough, hemoptysis, sputum production, shortness of breath and wheezing.   Cardiovascular: Negative for chest pain, palpitations, orthopnea and claudication.  Gastrointestinal: Positive for diarrhea. Negative for abdominal pain, blood in stool, constipation, heartburn, melena, nausea and vomiting.  Genitourinary: Negative for dysuria, flank pain, frequency, hematuria and urgency.  Musculoskeletal: Positive for joint pain. Negative for back pain and myalgias.  Skin: Negative for rash.  Neurological: Negative for dizziness, tingling, focal weakness, seizures, weakness and headaches.  Endo/Heme/Allergies: Does not bruise/bleed easily.  Psychiatric/Behavioral: Negative for depression and suicidal ideas. The patient does not have insomnia.       Allergies  Allergen Reactions  . Hydrocodone-Acetaminophen Hives    All over her body.  . Lactose Intolerance (Gi) Other (See Comments)    Bloating and GI upset  . Penicillins Rash    Did it involve swelling of the face/tongue/throat, SOB, or low BP? Unknown Did it involve sudden or severe rash/hives, skin peeling, or any reaction on the inside of your mouth or nose? Yes Did you need to seek medical attention at a hospital or doctor's office? Unknown When did it last happen? Childhood reaction. If all above answers are "NO", may proceed with cephalosporin use.   . Sulfa Antibiotics Rash    Full body rash      Past Medical History:  Diagnosis Date  . Anxiety   . Asthma    as a child  . Breast cancer ([place])   .  Carpal tunnel syndrome    R hand   . Colon polyps   . Depression   . Eosinophilic esophagitis     noted in [place] with GI path report 09/29/14   . Esophageal stricture    s/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 Edwin Cap   . Family history of breast cancer   . Family history of prostate cancer   . Herniated disc, cervical    s/p epidural injection  . History of kidney stones   . Hyperlipidemia   . Low back pain   . Migraines   . Personal history of chemotherapy   . Personal history of radiation therapy      Past Surgical History:  Procedure Laterality Date  . BREAST BIOPSY Left 12/25/2018   [place] BX, invasive mammary carcinoma   . BREAST LUMPECTOMY    . BREAST LUMPECTOMY WITH NEEDLE LOCALIZATION AND AXILLARY SENTINEL LYMPH NODE BX Left 01/16/2019   Procedure: LEFT BREAST LUMPECTOMY WITH NEEDLE LOCALIZATION AND SENTINEL LYMPH NODE BIOPSY;  Surgeon: Jovita Kussmaul, MD;  Location: [facility] ORS;  Service: General;  Laterality: Left;  . CESAREAN SECTION    . COLONOSCOPY WITH PROPOFOL N/A 03/27/2017   Procedure: COLONOSCOPY WITH PROPOFOL;  Surgeon: Jonathon Bellows, MD;  Location: [facility] ENDOSCOPY;  Service: Gastroenterology;  Laterality: N/A;  . ESOPHAGOGASTRODUODENOSCOPY    . ESOPHAGOGASTRODUODENOSCOPY (EGD) WITH PROPOFOL N/A 03/27/2017   Procedure: ESOPHAGOGASTRODUODENOSCOPY (EGD) WITH PROPOFOL WITH DILATION;  Surgeon: Jonathon Bellows, MD;  Location: [facility] ENDOSCOPY;  Service: Gastroenterology;  Laterality: N/A;  . PORTACATH PLACEMENT N/A 02/15/2019   Procedure: INSERTION PORT-A-CATH WITH POSSIBLE ULTRASOUND;  Surgeon: Jovita Kussmaul, MD;  Location: [facility] ORS;  Service: General;  Laterality: N/A;  . RE-EXCISION OF BREAST CANCER,SUPERIOR MARGINS Left 02/15/2019   Procedure: RE-EXCISION OF LEFT BREAST CANCER ANTERIOR MARGINS;  Surgeon: Jovita Kussmaul, MD;  Location: [facility] ORS;  Service: General;  Laterality: Left;  . THROAT SURGERY  2015  . UMBILICAL HERNIA REPAIR  2006    x 2   w mesh per pt    Social History   Socioeconomic History  . Marital status: Significant Other    Spouse  name: Not on file  . Number of children: Not on file  . Years of education: Not on file  . Highest education level: Not on file  Occupational History  . Not on file  Tobacco Use  . Smoking status: Former Smoker    Packs/day: 1.00    Years: 20.00    Pack years: 20.00    Quit date: 06/02/2006    Years since quitting: 13.4  . Smokeless tobacco: Never Used  Vaping Use  . Vaping Use: Never used  Substance and Sexual Activity  . Alcohol use: Not Currently  . Drug use: No  . Sexual activity: Yes    Partners: Male  Other Topics Concern  . 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 As of 02/26/17    Divorced.    Former smoker 20+ years quit in 1997 then started again for 5 years and quit 10-12 years ago as of 03/08/17    Social Determinants of Health   Financial Resource Strain:   . Difficulty of Paying Living Expenses: Not on file  Food Insecurity:   . Worried About Charity fundraiser in the  Last Year: Not on file  . Ran Out of Food in the Last Year: Not on file  Transportation Needs:   . Lack of Transportation (Medical): Not on file  . Lack of Transportation (Non-Medical): Not on file  Physical Activity:   . Days of Exercise per Week: Not on file  . Minutes of Exercise per Session: Not on file  Stress:   . Feeling of Stress : Not on file  Social Connections:   . Frequency of Communication with Friends and Family: Not on file  . Frequency of Social Gatherings with Friends and Family: Not on file  . Attends Religious Services: Not on file  . Active Member of Clubs or Organizations: Not on file  . Attends Archivist Meetings: Not on file  . Marital Status: Not on file  Intimate Partner Violence:   . Fear of Current or Ex-Partner: Not on file  . Emotionally Abused: Not on file  . Physically Abused: Not on file  . Sexually Abused: Not on  file    Family History  Problem Relation Age of Onset  . Breast cancer Mother        dx late 28s  . Thyroid disease Mother   . Colon polyps Mother   . Prostate cancer Father        dx 59  . Breast cancer Maternal Grandmother        dx late 47s  . Cancer Maternal Uncle        possibly, unsure type     Current Outpatient Medications:  .  acetaminophen (TYLENOL) 500 MG tablet, Take 500-1,000 mg by mouth every 6 (six) hours as needed (for pain.)., Disp: , Rfl:  .  Cholecalciferol (VITAMIN D-3) 125 MCG (5000 UT) TABS, Take 5,000 Units by mouth daily., Disp: , Rfl:  .  Cyanocobalamin (VITAMIN B-12 PO), Take 1 tablet by mouth daily. 1058m daily, Disp: , Rfl:  .  DULoxetine (CYMBALTA) 60 MG capsule, TAKE 1 CAPSULE BY MOUTH EVERY DAY, Disp: 90 capsule, Rfl: 1 .  fluticasone (FLONASE) 50 MCG/ACT nasal spray, Place 2 sprays into both nostrils daily. Max 2 sprays, Disp: 16 g, Rfl: 12 .  gabapentin (NEURONTIN) 300 MG capsule, Take 300 mg by mouth at bedtime., Disp: , Rfl:  .  lidocaine-prilocaine (EMLA) cream, Apply to affected area once, Disp: 30 g, Rfl: 3 .  meclizine (ANTIVERT) 12.5 MG tablet, Take 1 tablet (12.5 mg total) by mouth 3 (three) times daily as needed for dizziness., Disp: 90 tablet, Rfl: 1 .  meloxicam (MOBIC) 15 MG tablet, Take 15 mg by mouth daily., Disp: , Rfl:  .  methocarbamol (ROBAXIN) 750 MG tablet, Take 1 tablet (750 mg total) by mouth every 6 (six) hours as needed for muscle spasms., Disp: 120 tablet, Rfl: 5 .  omeprazole (PRILOSEC) 40 MG capsule, Take 40 mg by mouth every evening. , Disp: , Rfl:  .  tiZANidine (ZANAFLEX) 4 MG tablet, Take 4 mg by mouth at bedtime. , Disp: , Rfl:  .  traMADol (ULTRAM) 50 MG tablet, Take 1-2 tablets (50-100 mg total) by mouth every 6 (six) hours as needed., Disp: 20 tablet, Rfl: 0 .  traZODone (DESYREL) 50 MG tablet, Take 1-1.5 tablets (50-75 mg total) by mouth at bedtime as needed for sleep., Disp: 140 tablet, Rfl: 4 .  anastrozole  (ARIMIDEX) 1 MG tablet, Take 1 tablet (1 mg total) by mouth daily., Disp: 30 tablet, Rfl: 2 .  montelukast (SINGULAIR) 10 MG tablet, Take  1 tablet (10 mg total) by mouth at bedtime. (Patient not taking: Reported on 11/25/2019), Disp: 90 tablet, Rfl: 3  Physical exam:  Vitals:   11/25/19 1339  BP: 122/82  Pulse: 81  Resp: 16  Temp: 98.2 F (36.8 C)  Weight: 182 lb 14.4 oz (83 kg)  Height: _0  (1.6 m)   Physical Exam HENT:     Head: Normocephalic and atraumatic.  Eyes:     Pupils: Pupils are equal, round, and reactive to light.  Cardiovascular:     Rate and Rhythm: Normal rate and regular rhythm.     Heart sounds: Normal heart sounds.  Pulmonary:     Effort: Pulmonary effort is normal.     Breath sounds: Normal breath sounds.  Abdominal:     General: Bowel sounds are normal.     Palpations: Abdomen is soft.  Musculoskeletal:     Cervical back: Normal range of motion.  Skin:    General: Skin is warm and dry.  Neurological:     Mental Status: She is alert and oriented to person, place, and time.      CMP Latest Ref Rng & Units 11/25/2019  Glucose 70 - 99 mg/dL 108(H)  BUN 6 - 20 mg/dL 18  Creatinine 0.44 - 1.00 mg/dL 0.71  Sodium 135 - 145 mmol/L 139  Potassium 3.5 - 5.1 mmol/L 3.7  Chloride 98 - 111 mmol/L 103  CO2 22 - 32 mmol/L 24  Calcium 8.9 - 10.3 mg/dL 9.3  Total Protein 6.5 - 8.1 g/dL 7.4  Total Bilirubin 0.3 - 1.2 mg/dL 0.7  Alkaline Phos 38 - 126 U/L 57  AST 15 - 41 U/L 19  ALT 0 - 44 U/L 20   CBC Latest Ref Rng & Units 11/25/2019  WBC 4.0 - 10.5 K/uL 7.3  Hemoglobin 12.0 - 15.0 g/dL 14.3  Hematocrit 36 - 46 % 39.5  Platelets 150 - 400 K/uL 331    No images are attached to the encounter.  DG Bone Density  Result Date: 11/05/2019 EXAM: DUAL X-RAY ABSORPTIOMETRY (DXA) FOR BONE MINERAL DENSITY IMPRESSION: Dear Dr. Janese Banks, Your patient KHANH CORDNER completed a FRAX assessment on 11/05/2019 using the [place] (analysis version: 14.10)  manufactured by [employer]. The following summarizes the results of our evaluation. PATIENT BIOGRAPHICAL: Name: Noora, Locascio Patient ID: [id number] Birth Date: 10-Mar-1966 Height:    63.0 in. Gender:     Female    [age]       Weight:    184.0 lbs. Ethnicity:  White                            Exam Date: 11/05/2019 FRAX* RESULTS:  (version: 3.5) 10-year Probability of Fracture1 Major Osteoporotic Fracture2 Hip Fracture 4.6% 0.3% Population: [place] (Caucasian) Risk Factors: None Based on Femur (Left) Neck BMD 1 -The 10-year probability of fracture may be lower than reported if the patient has received treatment. 2 -Major Osteoporotic Fracture: Clinical Spine, Forearm, Hip or Shoulder *FRAX is a Materials engineer of the [employer] for Metabolic Bone Disease, a [place] (WHO) [facility]. ASSESSMENT: The probability of a major osteoporotic fracture is 4.6% within the next ten years. The probability of a hip fracture is 0.3% within the next ten years. . Your patient Naureen Benton completed a BMD test on 11/05/2019 using the [place] (  software version: 14.10) manufactured by [employer]. The following summarizes the results of our evaluation. Technologist: [facility] PATIENT BIOGRAPHICAL: Name: Raeanna, Soberanes Patient ID: [id number] Birth Date: 1966-02-02 Height: 63.0 in. Gender: Female Exam Date: 11/05/2019 Weight: 184.0 lbs. Indications: Caucasian, History of Breast Cancer, History of Chemo, History of Radiation, Postmenopausal Fractures: Treatments: Femara DENSITOMETRY RESULTS: Site      Region    Measured Date Measured Age WHO Classification Young Adult T-score BMD         %Change vs. Previous Significant Change (*) AP Spine L1-L4 11/05/2019 51.7 Normal -0.4 1.147 g/cm2 DualFemur Neck Left 11/05/2019 51.7 Osteopenia -1.2 0.871 g/cm2 ASSESSMENT: The BMD measured at Femur Neck Left is 0.871 g/cm2 with a T-score of -1.2. This patient is  considered osteopenic according to [place] [facility]) criteria. The scan quality is good. World Pharmacologist [facility]) criteria for post-menopausal, Caucasian Women: Normal:                   T-score at or above -1 SD Osteopenia/low bone mass: T-score between -1 and -2.5 SD Osteoporosis:             T-score at or below -2.5 SD RECOMMENDATIONS: 1. All patients should optimize calcium and vitamin D intake. 2. Consider FDA-approved medical therapies in postmenopausal women and men aged 33 years and older, based on the following: a. A hip or vertebral(clinical or morphometric) fracture b. T-score < -2.5 at the femoral neck or spine after appropriate evaluation to exclude secondary causes c. Low bone mass (T-score between -1.0 and -2.5 at the femoral neck or spine) and a 10-year probability of a hip fracture > 3% or a 10-year probability of a major osteoporosis-related fracture > 20% based on the [place]-adapted WHO algorithm 3. Clinician judgment and/or patient preferences may indicate treatment for people with 10-year fracture probabilities above or below these levels FOLLOW-UP: People with diagnosed cases of osteoporosis or at high risk for fracture should have regular bone mineral density tests. For patients eligible for Medicare, routine testing is allowed once every 2 years. The testing frequency can be increased to one year for patients who have rapidly progressing disease, those who are receiving or discontinuing medical therapy to restore bone mass, or have additional risk factors. I have reviewed this report, and agree with the above findings. [facility] Electronically Signed   By: Lowella Grip III M.D.   On: 11/05/2019 11:20   MM DIAG BREAST TOMO BILATERAL  Result Date: 11/15/2019 CLINICAL DATA:  History of LEFT breast cancer in 2020 s/p lumpectomy and radiation therapy. EXAM: DIGITAL DIAGNOSTIC BILATERAL MAMMOGRAM WITH TOMO AND CAD COMPARISON:  Previous exam(s). ACR Breast  Density Category c: The breast tissue is heterogeneously dense, which may obscure small masses. FINDINGS: There are expected postsurgical changes within the upper LEFT breast. There are no new dominant masses, suspicious calcifications or secondary signs of malignancy within either breast. Mammographic images were processed with CAD. IMPRESSION: No evidence of malignancy within either breast. Expected postsurgical changes within the LEFT breast. RECOMMENDATION: Bilateral diagnostic mammogram in 1 year. I have discussed the findings and recommendations with the patient. If applicable, a reminder letter will be sent to the patient regarding the next appointment. BI-RADS CATEGORY  2: Benign. Electronically Signed   By: Franki Cabot M.D.   On: 11/15/2019 11:04     Assessment and plan- Patient is a 54 y.o. female with prior history of polycythemia now with newly diagnosed invasive mammary carcinoma  pathologic prognostic stage Ia of the left breast pT1b pN1 acM0 ER/PR positive HER-2/neu negative. She is s/p lumpectomywith high risk MammaPrint score.She is s/p 4 cycles of dose dense AC and 12 cycles of weekly Taxol chemotherapy.  She is here for routine follow-up of breast cancer  Patient reports having significant diarrhea since she started letrozole.  It is affecting her quality of life.  I will therefore switch her to Arimidex at this time and see how she will tolerate that.  Patient had a high risk breast cancer to warrant adjuvant chemotherapy.  With ER positive disease she would also benefit from adjuvant bisphosphonates.  She will therefore proceed with adjuvant Zometa today and we have already obtained dental clearance for the same.  She will continue to get that every 6 months for up to 5 years.  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 Malignant neoplasm of upper-inner quadrant of  left breast in female, estrogen receptor positive ([place])   2. Use of letrozole (Femara)   3. Chemotherapy-induced peripheral neuropathy ([place])      Dr. Randa Evens, MD, MPH [facility] [phone] 11/26/2019 1:18 PM

## 2019-11-27 ENCOUNTER — Encounter: Payer: Self-pay | Admitting: Radiation Oncology

## 2019-11-27 ENCOUNTER — Ambulatory Visit
Admission: RE | Admit: 2019-11-27 | Discharge: 2019-11-27 | Disposition: A | Discharge status: Home / Self Care | Payer: Medicaid Other | Source: Ambulatory Visit | Attending: Radiation Oncology | Admitting: Radiation Oncology

## 2019-11-27 ENCOUNTER — Other Ambulatory Visit: Payer: Self-pay

## 2019-11-27 VITALS — BP 142/90 | HR 96 | Temp 96.7°F | Wt 185.8 lb

## 2019-11-27 DIAGNOSIS — C50212 Malignant neoplasm of upper-inner quadrant of left female breast: Secondary | ICD-10-CM

## 2019-11-27 DIAGNOSIS — Z170 Estrogen receptor positive status [ER+]: Secondary | ICD-10-CM

## 2019-11-27 NOTE — Progress Notes (Signed)
Radiation Oncology Follow up Note  Name: Leah [name]   Date:   11/27/2019 MRN:  [id number] DOB: 01-Mar-1966    This 54 y.o. female presents to the clinic today for 1 month follow-up status post whole breast radiation.  To her left breast for stage Ia (T1b N1 M0) ER/PR positive HER-2 negative invasive mammary carcinoma who completed dose dense AC plus Taxol  REFERRING PROVIDER: McLean-Scocuzza, Olivia Mackie *  HPI: Patient is a 54 year old female now out 1 month having completed whole breast radiation to her left breast for stage Ia invasive mammary carcinoma ER/PR positive status post dose dense AC plus Taxol and then whole breast radiation seen today in routine follow-up she is doing well she specifically denies breast tenderness cough or bone pain..  She has been started on Arimidex her second antiestrogen therapy and is slated to start that tonight.  She had a recent mammogram this month showing BI-RADS 2 benign findings.  COMPLICATIONS OF TREATMENT: none  FOLLOW UP COMPLIANCE: keeps appointments   PHYSICAL EXAM:  BP (!) 142/90   Pulse 96   Temp (!) 96.7 F (35.9 C) (Tympanic)   Wt 185 lb 12.8 oz (84.3 kg)   BMI 32.91 kg/m  Lungs are clear to A&P cardiac examination essentially unremarkable with regular rate and rhythm. No dominant mass or nodularity is noted in either breast in 2 positions examined. Incision is well-healed. No axillary or supraclavicular adenopathy is appreciated. Cosmetic result is excellent.  Well-developed well-nourished patient in NAD. HEENT reveals PERLA, EOMI, discs not visualized.  Oral cavity is clear. No oral mucosal lesions are identified. Neck is clear without evidence of cervical or supraclavicular adenopathy. Lungs are clear to A&P. Cardiac examination is essentially unremarkable with regular rate and rhythm without murmur rub or thrill. Abdomen is benign with no organomegaly or masses noted. Motor sensory and DTR levels are equal and symmetric in the upper  and lower extremities. Cranial nerves II through XII are grossly intact. Proprioception is intact. No peripheral adenopathy or edema is identified. No motor or sensory levels are noted. Crude visual fields are within normal range.  RADIOLOGY RESULTS: Mammograms reviewed  PLAN: Present time patient is doing well 1 month out from whole breast radiation and pleased with her overall progress.  She continues on Arimidex.  I have asked to see her back in 4 to 5 months for follow-up.  Patient knows to call with any concerns.  I would like to take this opportunity to thank you for allowing me to participate in the care of your patient.Noreene Filbert, MD

## 2019-11-27 NOTE — Progress Notes (Signed)
Survivorship Care Plan visit completed.  Treatment summary reviewed and give to patient.  ASCO answers booklet reviewed and given to patient.  CARE program and Cancer Transitions discussed with patient along with other resources cancer center offers to patients and caregivers.  Patient verbalized understanding.  SCP packet mailed.  Patient in agreement for APP to have a Virtual visit to introduce them to the Survivorship Clinic.  Encouraged patient to call for any questions or concerns.

## 2019-11-29 ENCOUNTER — Inpatient Hospital Stay (HOSPITAL_BASED_OUTPATIENT_CLINIC_OR_DEPARTMENT_OTHER): Payer: Medicaid Other | Admitting: Nurse Practitioner

## 2019-11-29 DIAGNOSIS — C50212 Malignant neoplasm of upper-inner quadrant of left female breast: Secondary | ICD-10-CM | POA: Diagnosis not present

## 2019-11-29 DIAGNOSIS — Z170 Estrogen receptor positive status [ER+]: Secondary | ICD-10-CM | POA: Diagnosis not present

## 2019-11-29 NOTE — Progress Notes (Signed)
Survivorship Clinic Consult Note [facility]  [phone] [phone] Virtual Visit Progress Note  I connected with Leah [name] on 11/29/19 at 12:00 PM EDT by video enabled telemedicine visit and verified that I am speaking with the correct person using two identifiers.   I discussed the limitations, risks, security and privacy concerns of performing an evaluation and management service by telemedicine and the availability of in-person appointments. I also discussed with the patient that there may be a patient responsible charge related to this service. The patient expressed understanding and agreed to proceed.   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 VUS in BARD1 called c.1672T>C (p.Ser558Pro) identified on the [facility] Common Hereditary Cancers Panel. The report date is 01/29/2019.  The Common Hereditary Cancers Panel offered by [facility] includes sequencing and/or deletion duplication testing of the following 47 genes: APC, ATM, AXIN2, BARD1, BMPR1A, BRCA1, BRCA2, BRIP1, CDH1, CDKN2A (p14ARF), CDKN2A (p16INK4a),  CKD4, CHEK2, CTNNA1, DICER1, EPCAM (Deletion/duplication testing only), GREM1 (promoter region deletion/duplication testing only), KIT, MEN1, MLH1, MSH2, MSH3, MSH6, MUTYH, NBN, NF1, NHTL1, PALB2, PDGFRA, PMS2, POLD1, POLE, PTEN, RAD50, RAD51C, RAD51C, SDHB, SDHC, SDHD, SMAD4, SMARCA4. STK11, TP53, TSC1, TSC2, and VHL.  The following genes were evaluated for sequence changes only: SDHA and HOXB13 c.251G>A variant only.   03/19/2019 - 05/13/2019 Chemotherapy   The patient had DOXOrubicin (ADRIAMYCIN) chemo injection 112 mg, 60 mg/m2 = 112 mg, Intravenous,  Once, 4 of 4 cycles Administration: 112 mg (03/19/2019), 112 mg (04/02/2019), 112 mg (04/16/2019), 112 mg (04/30/2019) palonosetron (ALOXI) injection 0.25 mg, 0.25 mg, Intravenous,  Once, 4 of 4 cycles Administration: 0.25 mg (03/19/2019), 0.25 mg (04/02/2019), 0.25 mg (04/16/2019), 0.25 mg (04/30/2019) pegfilgrastim (NEULASTA ONPRO KIT) injection 6 mg, 6 mg, Subcutaneous, Once, 4 of 4 cycles Administration: 6 mg (03/19/2019), 6 mg (04/02/2019), 6 mg (04/16/2019), 6 mg (04/30/2019) cyclophosphamide (CYTOXAN) 1,120 mg in sodium chloride 0.9 % 250 mL chemo infusion, 600 mg/m2 = 1,120 mg, Intravenous,  Once, 4 of 4 cycles Administration: 1,120 mg (03/19/2019), 1,120 mg (04/02/2019), 1,120 mg (04/16/2019), 1,120 mg (04/30/2019) fosaprepitant (EMEND) 150 mg in sodium chloride 0.9 % 145 mL IVPB, 150 mg, Intravenous,  Once, 4 of 4 cycles Administration: 150 mg (03/19/2019), 150 mg (04/02/2019), 150 mg (04/16/2019), 150 mg (04/30/2019)  for chemotherapy treatment.    05/14/2019 -  Chemotherapy   The patient had PACLitaxel (TAXOL) 150 mg in sodium chloride 0.9 % 250 mL chemo infusion (</= 59m/m2), 80 mg/m2 = 150 mg, Intravenous,  Once, 12 of 12 cycles Dose modification: 65 mg/m2 (original dose 80 mg/m2, Cycle 6, [name]: Other (see comments), Comment: neuropathy) Administration: 150 mg (05/14/2019), 150 mg (05/21/2019),  150 mg (05/28/2019), 150 mg (06/04/2019), 150 mg (06/11/2019), 126 mg  (06/18/2019), 126 mg (06/25/2019), 126 mg (07/02/2019), 126 mg (07/09/2019), 126 mg (07/16/2019), 126 mg (07/23/2019), 126 mg (08/06/2019)  for chemotherapy treatment.      INTERVAL HISTORY: Patient presents to the survivorship clinic today for initial meeting to review her survivorship care plan detailing her treatment course for breast cancer, as well as monitoring long-term side effects of that treatment, education regarding health maintenance, screening, and overall wellness and health promotion. Overall, she reports feeling well since completing treatment.  She offers no specific complaints today.  REVIEW OF SYSTEMS:  Review of Systems  Constitutional: Negative for chills, fever, malaise/fatigue and weight loss.  HENT: Negative for hearing loss, nosebleeds, sore throat and tinnitus.   Eyes: Negative for blurred vision and double vision.  Respiratory: Negative for cough, hemoptysis, shortness of breath and wheezing.   Cardiovascular: Negative for chest pain, palpitations and leg swelling.  Gastrointestinal: Negative for abdominal pain, blood in stool, constipation, diarrhea, melena, nausea and vomiting.  Genitourinary: Negative for dysuria and urgency.  Musculoskeletal: Negative for back pain, falls, joint pain and myalgias.  Skin: Negative for itching and rash.  Neurological: Negative for dizziness, tingling, sensory change, loss of consciousness, weakness and headaches.  Endo/Heme/Allergies: Negative for environmental allergies. Does not bruise/bleed easily.  Psychiatric/Behavioral: Negative for depression and memory loss. The patient is not nervous/anxious and does not have insomnia.   Breast: No new lumps, bumps, skin changes, discomfort, or discharge. No nipple changes.     PAST MEDICAL/SURGICAL HISTORY:  Past Medical History:  Diagnosis Date   Anxiety    Asthma    as a child   Breast cancer ([place])    Carpal tunnel syndrome    [name]    Colon polyps    Depression     Eosinophilic esophagitis    noted in [place] with GI path report 09/29/14    Esophageal stricture    s/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 Edwin Cap    Family history of breast cancer    Family history of prostate cancer    Herniated disc, cervical    s/p epidural injection   History of kidney stones    Hyperlipidemia    Low back pain    Migraines    Personal history of chemotherapy    Personal history of radiation therapy    Past Surgical History:  Procedure Laterality Date   BREAST BIOPSY Left 12/25/2018   [place] BX, invasive mammary carcinoma    BREAST LUMPECTOMY     BREAST LUMPECTOMY WITH NEEDLE LOCALIZATION AND AXILLARY SENTINEL LYMPH NODE BX Left 01/16/2019   Procedure: LEFT BREAST LUMPECTOMY WITH NEEDLE LOCALIZATION AND SENTINEL LYMPH NODE BIOPSY;  Surgeon: Jovita Kussmaul, MD;  Location: [facility] ORS;  Service: General;  Laterality: Left;   CESAREAN SECTION     COLONOSCOPY WITH PROPOFOL N/A 03/27/2017   Procedure: COLONOSCOPY WITH PROPOFOL;  Surgeon: Jonathon Bellows, MD;  Location[facility] ENDOSCOPY;  Service: Gastroenterology;  Laterality: N/A;   ESOPHAGOGASTRODUODENOSCOPY     ESOPHAGOGASTRODUODENOSCOPY (EGD) WITH PROPOFOL N/A 03/27/2017   Procedure: ESOPHAGOGASTRODUODENOSCOPY (EGD) WITH PROPOFOL WITH DILATION;  Surgeon: Jonathon Bellows, MD;  Location: [facility] ENDOSCOPY;  Service: Gastroenterology;  Laterality: N/A;   PORTACATH PLACEMENT N/A 02/15/2019   Procedure: INSERTION PORT-A-CATH WITH POSSIBLE ULTRASOUND;  Surgeon: Jovita Kussmaul, MD;  Location: [facility] ORS;  Service: General;  Laterality: N/A;   RE-EXCISION OF BREAST CANCER,SUPERIOR MARGINS Left 02/15/2019   Procedure: RE-EXCISION OF  LEFT BREAST CANCER ANTERIOR MARGINS;  Surgeon: Jovita Kussmaul, MD;  Location: [facility] ORS;  Service: General;  Laterality: Left;   THROAT SURGERY  2202   UMBILICAL HERNIA REPAIR  2006    x 2   w mesh per pt    SOCIAL HISTORY:  Social History   Socioeconomic History    Marital status: Significant Other    Spouse name: Not on file   Number of children: Not on file   Years of education: Not on file   Highest education level: Not on file  Occupational History   Not on file  Tobacco Use   Smoking status: Former Smoker    Packs/day: 1.00    Years: 20.00    Pack years: 20.00    Quit date: 06/02/2006    Years since quitting: 13.5   Smokeless tobacco: Never Used  Vaping Use   Vaping Use: Never used  Substance and Sexual Activity   Alcohol use: Not Currently   Drug use: No   Sexual activity: Yes    Partners: Male  Other Topics Concern   Not on file  Social History Narrative   Works in Scientist, 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 As of 02/26/17    Divorced.    Former smoker 20+ years quit in 1997 then started again for 5 years and quit 10-12 years ago as of 03/08/17    Social Determinants of Health   Financial Resource Strain:    Difficulty of Paying Living Expenses: Not on file  Food Insecurity:    Worried About Charity fundraiser in the Last Year: Not on file   [employer] of Food in the Last Year: Not on file  Transportation Needs:    Lack of Transportation (Medical): Not on file   Lack of Transportation (Non-Medical): Not on file  Physical Activity:    Days of Exercise per Week: Not on file   Minutes of Exercise per Session: Not on file  Stress:    Feeling of Stress : Not on file  Social Connections:    Frequency of Communication with Friends and Family: Not on file   Frequency of Social Gatherings with Friends and Family: Not on file   Attends Religious Services: Not on file   Active Member of Clubs or Organizations: Not on file   Attends Archivist Meetings: Not on file   Marital Status: Not on file  Intimate Partner Violence:    Fear of Current or Ex-Partner: Not on file   Emotionally Abused: Not on file   Physically  Abused: Not on file   Sexually Abused: Not on file    ALLERGIES:  Allergies  Allergen Reactions   Hydrocodone-Acetaminophen Hives    All over her body.   Lactose Intolerance (Gi) Other (See Comments)    Bloating and GI upset   Penicillins Rash    Did it involve swelling of the face/tongue/throat, SOB, or low BP? Unknown Did it involve sudden or severe rash/hives, skin peeling, or any reaction on the inside of your mouth or nose? Yes Did you need to seek medical attention at a hospital or doctor's office? Unknown When did it last happen? Childhood reaction. If all above answers are "NO", may proceed with cephalosporin use.    Sulfa Antibiotics Rash    Full body rash     CURRENT MEDICATIONS:  Outpatient  Encounter Medications as of 11/29/2019  Medication Sig   acetaminophen (TYLENOL) 500 MG tablet Take 500-1,000 mg by mouth every 6 (six) hours as needed (for pain.).   anastrozole (ARIMIDEX) 1 MG tablet Take 1 tablet (1 mg total) by mouth daily.   Cholecalciferol (VITAMIN D-3) 125 MCG (5000 UT) TABS Take 5,000 Units by mouth daily.   Cyanocobalamin (VITAMIN B-12 PO) Take 1 tablet by mouth daily. 1036m daily   DULoxetine (CYMBALTA) 60 MG capsule TAKE 1 CAPSULE BY MOUTH EVERY DAY   fluticasone (FLONASE) 50 MCG/ACT nasal spray Place 2 sprays into both nostrils daily. Max 2 sprays (Patient not taking: Reported on 11/27/2019)   gabapentin (NEURONTIN) 300 MG capsule Take 300 mg by mouth at bedtime.   lidocaine-prilocaine (EMLA) cream Apply to affected area once   meclizine (ANTIVERT) 12.5 MG tablet Take 1 tablet (12.5 mg total) by mouth 3 (three) times daily as needed for dizziness.   meloxicam (MOBIC) 15 MG tablet Take 15 mg by mouth daily.   methocarbamol (ROBAXIN) 750 MG tablet Take 1 tablet (750 mg total) by mouth every 6 (six) hours as needed for muscle spasms.   montelukast (SINGULAIR) 10 MG tablet Take 1 tablet (10 mg total) by mouth at bedtime. (Patient not  taking: Reported on 11/25/2019)   omeprazole (PRILOSEC) 40 MG capsule Take 40 mg by mouth every evening.    tiZANidine (ZANAFLEX) 4 MG tablet Take 4 mg by mouth at bedtime.    traMADol (ULTRAM) 50 MG tablet Take 1-2 tablets (50-100 mg total) by mouth every 6 (six) hours as needed.   traZODone (DESYREL) 50 MG tablet Take 1-1.5 tablets (50-75 mg total) by mouth at bedtime as needed for sleep.   [DISCONTINUED] prochlorperazine (COMPAZINE) 10 MG tablet Take 1 tablet (10 mg total) by mouth every 6 (six) hours as needed (Nausea or vomiting). (Patient not taking: Reported on 05/13/2019)   No facility-administered encounter medications on file as of 11/29/2019.   Immunization History  Administered Date(s) Administered   Influenza,inj,Quad PF,6+ Mos 06/01/2016, 03/08/2017, 01/03/2018   Influenza-Unspecified 03/01/2019   Tdap 03/13/2018    ONCOLOGIC FAMILY HISTORY:  Family History  Problem Relation Age of Onset   Breast cancer Mother        dx late 561s  Thyroid disease Mother    Colon polyps Mother    Prostate cancer Father        dx 548  Breast cancer Maternal Grandmother        dx late 552s  Cancer Maternal Uncle        possibly, unsure type    GENETIC COUNSELING/TESTING: 01/29/2019- [facility] Common Common hereditary cancer panel was reported as negative.  VUS and BARD1  PHYSICAL EXAMINATION:  Exam limited [name] to telemedicine   LABORATORY DATA:  No results found for: LABCA2  IMAGING STUDIES:  11/15/2019-bilateral mammogram reported as BI-RADS Category 2.  Plan to repeat diagnostic bilateral mammogram in September 2022.  11/05/2019-DG bone density reported T score of -1.2 consistent with osteopenia.   ASSESSMENT & PLAN:  Leah [name] a pleasant 54y.o. female with history of stage Ia ER/PR positive HER-2 negative invasive mammary carcinoma status post lumpectomy with high risk MammaPrint score followed by 4 cycles of dose dense Adriamycin and Cytoxan chemotherapy and 12  cycles of weekly Taxol chemotherapy.  Additionally, she received whole breast radiation and was started on Arimidex for antiestrogen therapy. She presents to the Survivorship clinic for our initial meeting and routine follow-up since completing  treatment. We reviewed her survivorship care plan and addressed any acute survivorship concerns since completing treatment.   1. History of stage Ia breast cancer: Leah [name] is continuing to recover from definitive treatment for breast cancer.  Today, she received a copy of her comprehensive survivorship care plan (SCP) which was reviewed with her in detail.  The SCP details her cancer diagnosis, treatment course, potential late/long-term effects of treatments appropriate follow-up care with recommendations for future, and patient education resources.  A copy of this summary, along with a letter will be sent to the patient's primary care provider via mail/backslash in basket after today's visit. We encouraged her to continue to follow-up with her health care team to continue to monitor help and manage any effects of treatment.  We discussed that there is a chance for the cancer can come back.  The vast majority of recurrences will be detected in the 2-3 years after treatment though late recurrences can occur.  The recommended surveillance for follow-up after diagnosis was reviewed in detail but depending on risk, we generally recommend follow up with breast exam every 3-6 months for the first 2 years then every 6-12 months for 3-5 years then annually thereafter.  We discussed that the goal of surveillance after treatment is to detect disease if it returns as early as possible.  The most useful tools for detecting recurrence include a thorough evaluation of symptoms and a physical exam which includes a complete breast exam and yearly mammograms or other imaging studies. Additional imaging may be considered based on symptoms or examination findings which is why is it is  important to report concerns to her health care team.  Blood work may be performed at these visits for if there are symptoms or exam findings concerning for recurrence.  Patient was advised that if she feels that something is not right she should schedule an appointment to be evaluated.  Concerning symptoms discussed in detail.   Leah [name] will return to the survivorship clinic as needed and continue scheduled followups with her healthcare team as outlined in her SCP.   2. Survivorship Care Survey: Survivorship Care Survey completed post-treatment as recommended by the NCCN Survivorship Guidelines. Patient's answers were reviewed. Based on these, as well as the type of cancer and treatment she underwent, the following Survivorship Topics were discussed:    - Anxiety, Depression, and Distress- denies - Cognitive Function- chemo brain - Fatigue- denies - Lymphedema- denies - Hormone-Related Symptoms- denies - Pain- denies - Sexual Function- denies - Sleep Disorder- denies - Healthy Lifestyle-see below - Immunizations and Infections-see below.  COVID-19 vaccination and annual flu vaccines encouraged  Neuropathy- acupuncture, on cymbalta. Helping.  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   Some common side effects of treatment may include postsurgical pain, fatigue, lymphedema, menopause, fluid retention, weight gain, changes in sexuality, pain with intercourse, vaginal dryness, depression/anxiety, and numbness/tingling.  3. Port-a-Cath: Patient had port placed for administration of chemotherapy on 02/15/2019. It remains in place at this time. Discussed the need to flush the port every 6 to 12 weeks. We also discussed that her medical oncologist will likely consider the removal of the port when she is 1 year post treatment.  Patient verbalizes understanding and agrees to comply with port flush schedule. Removal at end of October planned.   4. Tobacco Avoidance: I commended Ms.  [name] continued efforts to remain tobacco-free. She is a former smoker and quit in 2008. She does not qualify for lung cancer  screening. We discussed that one of the most important risk reduction strategies in preventing cancer recurrence cancer patients is tobacco avoidance.  She is committed to abstaining from tobacco.  5. Cancer screening/Health & Wellness Promotion:  [name] to Leah [name]'s history and her age, she should receive screening for skin cancers, colon cancer, and gynecologic cancers. The information and recommendations are listed on the patient's comprehensive care plan/treatment summary and were reviewed in detail with the patient.  I encouraged her to speak with her PCP about arranging these and performing annual wellness exams, as appropriate.    Colorectal cancer-for the average risk patient, screening for colon cancer is recommended beginning at age 73.  Various screening strategies exist including colonoscopy, sigmoidoscopy, stool testing for blood, etc.  You can discuss these with your primary care provider to determine which option is best for you.  6.  Bone density screening - bone mineral density testing is typically recommended after 26 or younger women with medical conditions or use of medications associated with low bone mass or bone loss.  We discussed the impact of Arimidex on bone density.  I encouraged additional supplementation with calcium 1200 mg and vitamin D 1000 IU daily taken with food to enhance absorption.  I also discussed the importance of weightbearing exercise as tolerated.  Last bone density scan showed osteopenia.  With the ER positive disease she would also benefit from adjuvant bisphosphonates and she has started Zometa.  Encouraged her to continue routine dental visits.  7. Physical activity/Healthy eating: Getting adequate physical activity and maintaining a healthy diet as a cancer survivor is important for overall wellness and reduces the risk of cancer  recurrence.   We reviewed the "Nutrition Rainbow" handout, as well as the handout "Take Control of Your Health and Reduce Your Cancer Risk" from the [employer].  She was also encouraged to engage in moderate to vigorous exercise for 30 minutes per day most days of the week. We discussed the CARE program which is offered 2 days a week at [facility] without cost to cancer survivors. At this time she declines referral to the CARE program but should she change her mind, a referral can be placed in Epic by entering the order 'Referral to CARE' at [facility].  We also discussed the importance and health benefits of maintaining a healthy weight and eating a balanced diet. Leah [name] was encouraged to consume 5-7 servings of fruits and vegetables per day. We discussed that a healthy BMI is 18.5-24.9 and that maintaining a health weight reduces risk of cancer recurrences.   8. Support services/Counseling: Leah [name] was seen today in in effort to address both the physical and social concerns of our cancer survivors at [facility]. It is not uncommon for this period of the patient's cancer care trajectory to be one of many emotions and stressors.  I provided support today through active listening, validation of concerns, and expressive supportive counseling.  Leah [name] was encouraged to take advantage of our support services programs and support groups to better cope in her new life as a cancer survivor after completing anti-cancer treatment. We also discussed Counseling Services available to Cancer Survivors with Nathanial Millman, LCSW. She declines a referral at this time but one can be considered at any time.   Dispo:  - Return to [facility] for follow-up with Medical Oncology, Dr. Janese Banks on 02/17/2020 as scheduled - Return to survivorship clinic as needed; no additional follow-up needed at this time.  -  Consider transitioning the patient to long-term survivorship, when clinically appropriate.   I  discussed the assessment and treatment plan with the patient. The patient was provided an opportunity to ask questions and all were answered. The patient agreed with the plan and demonstrated an understanding of the instructions.   The patient was advised to call back or seek an in-person evaluation if the symptoms worsen or if the condition fails to improve as anticipated.   I provided 25 minutes of face-to-face video visit time during this encounter, and > 50% was spent counseling as documented under my assessment & plan.  Beckey Rutter, DNP, AGNP-C [place] at [facility]  Note: [place], [place]low, [place] [phone]

## 2019-12-02 ENCOUNTER — Ambulatory Visit (INDEPENDENT_AMBULATORY_CARE_PROVIDER_SITE_OTHER): Payer: Medicaid Other | Admitting: Psychology

## 2019-12-02 DIAGNOSIS — F418 Other specified anxiety disorders: Secondary | ICD-10-CM

## 2019-12-18 ENCOUNTER — Encounter: Payer: Self-pay | Admitting: Oncology

## 2019-12-26 ENCOUNTER — Other Ambulatory Visit: Payer: Self-pay

## 2019-12-26 MED ORDER — OMEPRAZOLE 40 MG PO CPDR: 40.0000 mg | DELAYED_RELEASE_CAPSULE | Freq: Every evening | ORAL | 1 refills | Status: DC

## 2019-12-30 ENCOUNTER — Ambulatory Visit (INDEPENDENT_AMBULATORY_CARE_PROVIDER_SITE_OTHER): Payer: Medicaid Other | Admitting: Psychology

## 2019-12-30 DIAGNOSIS — F418 Other specified anxiety disorders: Secondary | ICD-10-CM | POA: Diagnosis not present

## 2020-01-02 DIAGNOSIS — Z01818 Encounter for other preprocedural examination: Secondary | ICD-10-CM | POA: Diagnosis not present

## 2020-01-06 DIAGNOSIS — Z452 Encounter for adjustment and management of vascular access device: Secondary | ICD-10-CM | POA: Diagnosis not present

## 2020-01-24 ENCOUNTER — Encounter: Payer: Self-pay | Admitting: Internal Medicine

## 2020-01-24 ENCOUNTER — Encounter: Payer: Self-pay | Admitting: Oncology

## 2020-01-27 ENCOUNTER — Other Ambulatory Visit: Payer: Self-pay | Admitting: Internal Medicine

## 2020-01-27 DIAGNOSIS — E559 Vitamin D deficiency, unspecified: Secondary | ICD-10-CM

## 2020-01-27 DIAGNOSIS — Z13220 Encounter for screening for lipoid disorders: Secondary | ICD-10-CM

## 2020-01-27 DIAGNOSIS — E538 Deficiency of other specified B group vitamins: Secondary | ICD-10-CM

## 2020-01-27 DIAGNOSIS — R739 Hyperglycemia, unspecified: Secondary | ICD-10-CM

## 2020-01-27 DIAGNOSIS — Z1329 Encounter for screening for other suspected endocrine disorder: Secondary | ICD-10-CM

## 2020-01-27 DIAGNOSIS — Z1389 Encounter for screening for other disorder: Secondary | ICD-10-CM

## 2020-01-28 ENCOUNTER — Other Ambulatory Visit: Payer: Self-pay

## 2020-01-28 DIAGNOSIS — A6000 Herpesviral infection of urogenital system, unspecified: Secondary | ICD-10-CM

## 2020-01-28 NOTE — Telephone Encounter (Signed)
Okay to fill Valtrex? Patient has not been seen in the last 6 months.

## 2020-02-03 ENCOUNTER — Encounter: Payer: Self-pay | Admitting: Internal Medicine

## 2020-02-03 MED ORDER — VALACYCLOVIR HCL 1 G PO TABS: 1000.0000 mg | ORAL_TABLET | Freq: Every day | ORAL | 3 refills | Status: DC

## 2020-02-12 ENCOUNTER — Other Ambulatory Visit: Payer: Self-pay | Admitting: Oncology

## 2020-02-12 NOTE — Telephone Encounter (Signed)
Called pt to notify of Lab location change

## 2020-02-17 ENCOUNTER — Other Ambulatory Visit: Payer: Self-pay

## 2020-02-17 ENCOUNTER — Inpatient Hospital Stay (HOSPITAL_BASED_OUTPATIENT_CLINIC_OR_DEPARTMENT_OTHER): Payer: Medicaid Other | Admitting: Oncology

## 2020-02-17 ENCOUNTER — Inpatient Hospital Stay: Payer: Medicaid Other | Attending: Oncology

## 2020-02-17 ENCOUNTER — Encounter: Payer: Self-pay | Admitting: Oncology

## 2020-02-17 VITALS — BP 125/89 | HR 91 | Temp 99.0°F | Resp 16 | Ht 63.0 in | Wt 176.7 lb

## 2020-02-17 DIAGNOSIS — Z853 Personal history of malignant neoplasm of breast: Secondary | ICD-10-CM | POA: Diagnosis not present

## 2020-02-17 DIAGNOSIS — C50212 Malignant neoplasm of upper-inner quadrant of left female breast: Secondary | ICD-10-CM | POA: Diagnosis not present

## 2020-02-17 DIAGNOSIS — Z5181 Encounter for therapeutic drug level monitoring: Secondary | ICD-10-CM | POA: Diagnosis not present

## 2020-02-17 DIAGNOSIS — G629 Polyneuropathy, unspecified: Secondary | ICD-10-CM | POA: Diagnosis not present

## 2020-02-17 DIAGNOSIS — Z8719 Personal history of other diseases of the digestive system: Secondary | ICD-10-CM | POA: Insufficient documentation

## 2020-02-17 DIAGNOSIS — Z803 Family history of malignant neoplasm of breast: Secondary | ICD-10-CM | POA: Insufficient documentation

## 2020-02-17 DIAGNOSIS — Z8042 Family history of malignant neoplasm of prostate: Secondary | ICD-10-CM | POA: Diagnosis not present

## 2020-02-17 DIAGNOSIS — Z08 Encounter for follow-up examination after completed treatment for malignant neoplasm: Secondary | ICD-10-CM | POA: Diagnosis not present

## 2020-02-17 DIAGNOSIS — Z923 Personal history of irradiation: Secondary | ICD-10-CM | POA: Diagnosis not present

## 2020-02-17 DIAGNOSIS — Z87891 Personal history of nicotine dependence: Secondary | ICD-10-CM | POA: Diagnosis not present

## 2020-02-17 DIAGNOSIS — Z9221 Personal history of antineoplastic chemotherapy: Secondary | ICD-10-CM | POA: Insufficient documentation

## 2020-02-17 DIAGNOSIS — R232 Flushing: Secondary | ICD-10-CM | POA: Insufficient documentation

## 2020-02-17 DIAGNOSIS — G620 Drug-induced polyneuropathy: Secondary | ICD-10-CM | POA: Insufficient documentation

## 2020-02-17 DIAGNOSIS — Z170 Estrogen receptor positive status [ER+]: Secondary | ICD-10-CM | POA: Diagnosis not present

## 2020-02-17 DIAGNOSIS — Z79811 Long term (current) use of aromatase inhibitors: Secondary | ICD-10-CM

## 2020-02-17 DIAGNOSIS — Z79899 Other long term (current) drug therapy: Secondary | ICD-10-CM | POA: Diagnosis not present

## 2020-02-17 LAB — CBC WITH DIFFERENTIAL/PLATELET
Abs Immature Granulocytes: 0.02 10*3/uL (ref 0.00–0.07)
Basophils Absolute: 0.1 10*3/uL (ref 0.0–0.1)
Basophils Relative: 1 %
Eosinophils Absolute: 0.2 10*3/uL (ref 0.0–0.5)
Eosinophils Relative: 2 %
HCT: 43.3 % (ref 36.0–46.0)
Hemoglobin: 15.6 g/dL — ABNORMAL HIGH (ref 12.0–15.0)
Immature Granulocytes: 0 %
Lymphocytes Relative: 18 %
Lymphs Abs: 1.2 10*3/uL (ref 0.7–4.0)
MCH: 31.7 pg (ref 26.0–34.0)
MCHC: 36 g/dL (ref 30.0–36.0)
MCV: 88 fL (ref 80.0–100.0)
Monocytes Absolute: 0.7 10*3/uL (ref 0.1–1.0)
Monocytes Relative: 11 %
Neutro Abs: 4.5 10*3/uL (ref 1.7–7.7)
Neutrophils Relative %: 68 %
Platelets: 310 10*3/uL (ref 150–400)
RBC: 4.92 MIL/uL (ref 3.87–5.11)
RDW: 11.4 % — ABNORMAL LOW (ref 11.5–15.5)
WBC: 6.7 10*3/uL (ref 4.0–10.5)
nRBC: 0 % (ref 0.0–0.2)

## 2020-02-17 LAB — COMPREHENSIVE METABOLIC PANEL
ALT: 21 U/L (ref 0–44)
AST: 19 U/L (ref 15–41)
Albumin: 4.3 g/dL (ref 3.5–5.0)
Alkaline Phosphatase: 49 U/L (ref 38–126)
Anion gap: 9 (ref 5–15)
BUN: 10 mg/dL (ref 6–20)
CO2: 29 mmol/L (ref 22–32)
Calcium: 9.4 mg/dL (ref 8.9–10.3)
Chloride: 100 mmol/L (ref 98–111)
Creatinine, Ser: 0.75 mg/dL (ref 0.44–1.00)
GFR, Estimated: >60 mL/min (ref >60)
Glucose, Bld: 124 mg/dL — ABNORMAL HIGH (ref 70–99)
Potassium: 3.7 mmol/L (ref 3.5–5.1)
Sodium: 138 mmol/L (ref 135–145)
Total Bilirubin: 0.8 mg/dL (ref 0.3–1.2)
Total Protein: 7.4 g/dL (ref 6.5–8.1)

## 2020-02-17 NOTE — Progress Notes (Signed)
Pt doing ok. She does have hot flashes and they come an go. Also she has night sweats. She has fan at work and home. She is having trouble with eating. She has no appetite several days. Sometimes she gets nauseated and has water or gingerale. Her bowels are working but a lot of the times it has a loose form. Dried white drainage on her panties several times a week

## 2020-02-20 ENCOUNTER — Other Ambulatory Visit: Payer: Self-pay | Admitting: *Deleted

## 2020-02-20 ENCOUNTER — Other Ambulatory Visit: Payer: Self-pay | Admitting: Oncology

## 2020-02-20 MED ORDER — GABAPENTIN 300 MG PO CAPS
300.0000 mg | ORAL_CAPSULE | Freq: Three times a day (TID) | ORAL | 2 refills | Status: DC
Start: 2020-02-20 — End: 2020-06-14

## 2020-02-20 NOTE — Progress Notes (Signed)
Hematology/Oncology Consult note [facility]  Telephone:([phone] [phone]  Patient Care Team: McLean-Scocuzza, Nino Glow, MD as PCP - General (Internal Medicine) Jovita Kussmaul, MD as Consulting Physician (General Surgery) Noreene Filbert, MD as Referring Physician (Radiation Oncology) Rico Junker, RN as Registered Nurse Sindy Guadeloupe, MD as Consulting Physician (Oncology)   Name of the patient: Leah [name]  [id number]  29-Nov-1965   Date of visit: 02/20/20  Diagnosis-  pathological prognostic stage Ia invasive mammary carcinomapT1b pN1 cM0 ER/PR positive HER-2/neu negative s/p lumpectomy  Chief complaint/ Reason for visit-routine follow-up of breast cancer  Heme/Onc history:Patient is a 54 year old postmenopausal female G2 P2 L2 who recently underwent screening bilateral mammogram which showed area of concern in her left breast. This was followed by an ultrasound and Core biopsy. Ultrasound showed 1 x 1.1 x 0.7 cm hypoechoic mass at the 10:30 position of the left breast. Left axilla appeared normal. 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  Final pathology showed 9 mm grade 1 invasive mammary carcinoma1 out of 2 lymph nodes involved with macro metastatic carcinoma 3.5 mm. Anterior margin was focally positive for which patientunderwent reexcision surgery with negative marginsPT1BPN1a (sn)  MammaPrint score came back as high risk with a risk of recurrence of 29% at 10 years without chemotherapy  Patient completed 4 cycles of dose dense AC chemotherapy and 12 cycles of weekly Taxol.  She was started on letrozole in August 2021 but could not tolerate it due to diarrhea and is currently on Arimidex  Interval history-currently reports having significant hot flashes.  Denies any significant arthralgias.  States neuropathy is currently stable.  She is taking  gabapentin 300 mg at bedtime  ECOG PS- 1 Pain scale- 0   Review of systems- Review of Systems  Constitutional: Negative for chills, fever, malaise/fatigue and weight loss.  HENT: Negative for congestion, ear discharge and nosebleeds.   Eyes: Negative for blurred vision.  Respiratory: Negative for cough, hemoptysis, sputum production, shortness of breath and wheezing.   Cardiovascular: Negative for chest pain, palpitations, orthopnea and claudication.  Gastrointestinal: Negative for abdominal pain, blood in stool, constipation, diarrhea, heartburn, melena, nausea and vomiting.  Genitourinary: Negative for dysuria, flank pain, frequency, hematuria and urgency.  Musculoskeletal: Negative for back pain, joint pain and myalgias.  Skin: Negative for rash.  Neurological: Positive for sensory change (Peripheral neuropathy). Negative for dizziness, tingling, focal weakness, seizures, weakness and headaches.  Endo/Heme/Allergies: Does not bruise/bleed easily.       Hot flashes  Psychiatric/Behavioral: Negative for depression and suicidal ideas. The patient does not have insomnia.        Allergies  Allergen Reactions  . Hydrocodone-Acetaminophen Hives    All over her body.  . Lactose Intolerance (Gi) Other (See Comments)    Bloating and GI upset  . Penicillins Rash    Did it involve swelling of the face/tongue/throat, SOB, or low BP? Unknown Did it involve sudden or severe rash/hives, skin peeling, or any reaction on the inside of your mouth or nose? Yes Did you need to seek medical attention at a hospital or doctor's office? Unknown When did it last happen? Childhood reaction. If all above answers are "NO", may proceed with cephalosporin use.   . Sulfa Antibiotics Rash    Full body rash      Past Medical History:  Diagnosis Date  . Anxiety   . Asthma  as a child  . Breast cancer ([place])   . Carpal tunnel syndrome    R hand   . Colon polyps   . Depression   .  Eosinophilic esophagitis    noted in [place] with GI path report 09/29/14   . Esophageal stricture    s/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 Edwin Cap   . Family history of breast cancer   . Family history of prostate cancer   . Herniated disc, cervical    s/p epidural injection  . Herpes   . History of kidney stones   . Hyperlipidemia   . Low back pain   . Migraines   . Personal history of chemotherapy   . Personal history of radiation therapy      Past Surgical History:  Procedure Laterality Date  . BREAST BIOPSY Left 12/25/2018   [place] BX, invasive mammary carcinoma   . BREAST LUMPECTOMY    . BREAST LUMPECTOMY WITH NEEDLE LOCALIZATION AND AXILLARY SENTINEL LYMPH NODE BX Left 01/16/2019   Procedure: LEFT BREAST LUMPECTOMY WITH NEEDLE LOCALIZATION AND SENTINEL LYMPH NODE BIOPSY;  Surgeon: Jovita Kussmaul, MD;  Location: [facility] ORS;  Service: General;  Laterality: Left;  . CESAREAN SECTION    . COLONOSCOPY WITH PROPOFOL N/A 03/27/2017   Procedure: COLONOSCOPY WITH PROPOFOL;  Surgeon: Jonathon Bellows, MD;  Location: [facility] ENDOSCOPY;  Service: Gastroenterology;  Laterality: N/A;  . ESOPHAGOGASTRODUODENOSCOPY    . ESOPHAGOGASTRODUODENOSCOPY (EGD) WITH PROPOFOL N/A 03/27/2017   Procedure: ESOPHAGOGASTRODUODENOSCOPY (EGD) WITH PROPOFOL WITH DILATION;  Surgeon: Jonathon Bellows, MD;  Location: [facility] ENDOSCOPY;  Service: Gastroenterology;  Laterality: N/A;  . PORTACATH PLACEMENT N/A 02/15/2019   Procedure: INSERTION PORT-A-CATH WITH POSSIBLE ULTRASOUND;  Surgeon: Jovita Kussmaul, MD;  Location: [facility] ORS;  Service: General;  Laterality: N/A;  . RE-EXCISION OF BREAST CANCER,SUPERIOR MARGINS Left 02/15/2019   Procedure: RE-EXCISION OF LEFT BREAST CANCER ANTERIOR MARGINS;  Surgeon: Jovita Kussmaul, MD;  Location: [facility] ORS;  Service: General;  Laterality: Left;  . THROAT SURGERY  2015  . UMBILICAL HERNIA REPAIR  2006    x 2   w mesh per pt    Social History   Socioeconomic History  .  Marital status: Significant Other    Spouse name: Not on file  . Number of children: Not on file  . Years of education: Not on file  . Highest education level: Not on file  Occupational History  . Not on file  Tobacco Use  . Smoking status: Former Smoker    Packs/day: 1.00    Years: 20.00    Pack years: 20.00    Quit date: 06/02/2006    Years since quitting: 13.7  . Smokeless tobacco: Never Used  Vaping Use  . Vaping Use: Never used  Substance and Sexual Activity  . Alcohol use: Not Currently  . Drug use: No  . Sexual activity: Yes    Partners: Male  Other Topics Concern  . 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 As of 02/26/17    Divorced.    Former smoker 20+ years quit in 1997 then started again for 5 years and quit 10-12 years ago as of 03/08/17    Social Determinants of Health   Financial Resource Strain: Not on file  Food Insecurity: Not on file  Transportation Needs: Not on file  Physical Activity: Not on file  Stress: Not on file  Social Connections: Not on file  Intimate Partner Violence: Not on file    Family History  Problem Relation Age of Onset  . Breast cancer Mother        dx late 68s  . Thyroid disease Mother   . Colon polyps Mother   . Prostate cancer Father        dx 55  . Breast cancer Maternal Grandmother        dx late 24s  . Cancer Maternal Uncle        possibly, unsure type     Current Outpatient Medications:  .  acetaminophen (TYLENOL) 500 MG tablet, Take 500-1,000 mg by mouth every 6 (six) hours as needed (for pain.)., Disp: , Rfl:  .  anastrozole (ARIMIDEX) 1 MG tablet, TAKE 1 TABLET BY MOUTH EVERY DAY, Disp: 90 tablet, Rfl: 2 .  Cholecalciferol (VITAMIN D-3) 125 MCG (5000 UT) TABS, Take 5,000 Units by mouth daily., Disp: , Rfl:  .  Cyanocobalamin (VITAMIN B-12 PO), Take 1 tablet by mouth daily. 1088m  daily, Disp: , Rfl:  .  gabapentin (NEURONTIN) 300 MG capsule, Take 300 mg by mouth at bedtime., Disp: , Rfl:  .  meclizine (ANTIVERT) 12.5 MG tablet, Take 1 tablet (12.5 mg total) by mouth 3 (three) times daily as needed for dizziness., Disp: 90 tablet, Rfl: 1 .  meloxicam (MOBIC) 15 MG tablet, Take 15 mg by mouth daily., Disp: , Rfl:  .  methocarbamol (ROBAXIN) 750 MG tablet, Take 1 tablet (750 mg total) by mouth every 6 (six) hours as needed for muscle spasms., Disp: 120 tablet, Rfl: 5 .  omeprazole (PRILOSEC) 40 MG capsule, Take 1 capsule (40 mg total) by mouth every evening., Disp: 90 capsule, Rfl: 1 .  tiZANidine (ZANAFLEX) 4 MG tablet, Take 4 mg by mouth at bedtime. , Disp: , Rfl:  .  traMADol (ULTRAM) 50 MG tablet, Take 1-2 tablets (50-100 mg total) by mouth every 6 (six) hours as needed., Disp: 20 tablet, Rfl: 0 .  traZODone (DESYREL) 50 MG tablet, Take 1-1.5 tablets (50-75 mg total) by mouth at bedtime as needed for sleep., Disp: 140 tablet, Rfl: 4 .  valACYclovir (VALTREX) 1000 MG tablet, Take 1 tablet (1,000 mg total) by mouth daily. Suppression. BID X 5 days with outbreak as needed, Disp: 120 tablet, Rfl: 3 .  DULoxetine (CYMBALTA) 60 MG capsule, TAKE 1 CAPSULE BY MOUTH EVERY DAY, Disp: 90 capsule, Rfl: 1 .  fluticasone (FLONASE) 50 MCG/ACT nasal spray, Place 2 sprays into both nostrils daily. Max 2 sprays (Patient not taking: Reported on 11/27/2019), Disp: 16 g, Rfl: 12  Physical exam:  Vitals:   02/17/20 1134  BP: 125/89  Pulse: 91  Resp: 16  Temp: 99 F (37.2 C)  TempSrc: Oral  Weight: 176 lb 11.2 oz (80.2 kg)  Height: '5\' 3"'  (1.6 m)   Physical Exam Constitutional:      General: She is not in acute distress. Eyes:     Extraocular Movements: EOM normal.  Cardiovascular:     Rate and Rhythm: Normal rate and regular rhythm.     Heart sounds: Normal heart sounds.  Pulmonary:     Effort: Pulmonary effort is normal.     Breath sounds: Normal breath sounds.  Abdominal:      General: Bowel sounds are normal.     Palpations: Abdomen is soft.  Skin:  General: Skin is warm and dry.  Neurological:     Mental Status: She is alert and oriented to person, place, and time.     Breast exam was performed in seated and lying down position. Patient is status post left lumpectomy with a well-healed surgical scar. No evidence of any palpable masses. No evidence of axillary adenopathy. No evidence of any palpable masses or lumps in the right breast. No evidence of right axillary adenopathy  CMP Latest Ref Rng & Units 02/17/2020  Glucose 70 - 99 mg/dL 124(H)  BUN 6 - 20 mg/dL 10  Creatinine 0.44 - 1.00 mg/dL 0.75  Sodium 135 - 145 mmol/L 138  Potassium 3.5 - 5.1 mmol/L 3.7  Chloride 98 - 111 mmol/L 100  CO2 22 - 32 mmol/L 29  Calcium 8.9 - 10.3 mg/dL 9.4  Total Protein 6.5 - 8.1 g/dL 7.4  Total Bilirubin 0.3 - 1.2 mg/dL 0.8  Alkaline Phos 38 - 126 U/L 49  AST 15 - 41 U/L 19  ALT 0 - 44 U/L 21   CBC Latest Ref Rng & Units 02/17/2020  WBC 4.0 - 10.5 K/uL 6.7  Hemoglobin 12.0 - 15.0 g/dL 15.6(H)  Hematocrit 36.0 - 46.0 % 43.3  Platelets 150 - 400 K/uL 310      Assessment and plan- Patient is a 54 y.o. female with prior history of polycythemia now with newly diagnosed invasive mammary carcinoma pathologic prognostic stage Ia of the left breast pT1b pN1 acM0 ER/PR positive HER-2/neu negative. She is s/p lumpectomywith high risk MammaPrint score.She is s/p 4 cycles of dose dense AC and 12 cycles of weekly Taxol chemotherapy.  She is here for routine follow-up of breast cancer  Clinically patient is doing well with no concerning signs and symptoms of recurrence based on today's exam.  Mammogram from September 2021 did not show any evidence of malignancy.  She will continue to take Arimidex for 10 years along with calcium and vitamin D.  Hot flashes: She is asking about black cohosh and I would recommend that she did not take it.  Black cohosh can sometimes have  phytoestrogenic properties.  It can also be associated with abnormal LFTs.  Recommend that she go up on the dose of gabapentin 300 mg 3 times daily if she can tolerate it.  Oxybutynin also remains another choice.  She is already on Cymbalta for her peripheral neuropathy and we can also consider switching her to citalopram in the future  I will see her back in March 2022 with CBC with differential and CMP and she will receive her next dose of Zometa at that time   Visit Diagnosis 1. Use of letrozole (Femara)   2. Encounter for follow-up surveillance of breast cancer      Dr. Randa Evens, MD, MPH [facility] [phone] 02/20/2020 8:56 AM

## 2020-02-26 ENCOUNTER — Encounter: Payer: Self-pay | Admitting: Internal Medicine

## 2020-03-19 ENCOUNTER — Ambulatory Visit (INDEPENDENT_AMBULATORY_CARE_PROVIDER_SITE_OTHER): Payer: Medicaid Other | Admitting: Internal Medicine

## 2020-03-19 ENCOUNTER — Ambulatory Visit (INDEPENDENT_AMBULATORY_CARE_PROVIDER_SITE_OTHER): Payer: Medicaid Other

## 2020-03-19 ENCOUNTER — Other Ambulatory Visit: Payer: Self-pay

## 2020-03-19 VITALS — BP 118/70 | HR 85 | Temp 98.2°F | Resp 16 | Ht 63.0 in | Wt 178.6 lb

## 2020-03-19 DIAGNOSIS — E559 Vitamin D deficiency, unspecified: Secondary | ICD-10-CM | POA: Diagnosis not present

## 2020-03-19 DIAGNOSIS — R739 Hyperglycemia, unspecified: Secondary | ICD-10-CM

## 2020-03-19 DIAGNOSIS — M542 Cervicalgia: Secondary | ICD-10-CM

## 2020-03-19 DIAGNOSIS — Z1329 Encounter for screening for other suspected endocrine disorder: Secondary | ICD-10-CM | POA: Diagnosis not present

## 2020-03-19 DIAGNOSIS — M79604 Pain in right leg: Secondary | ICD-10-CM

## 2020-03-19 DIAGNOSIS — M62838 Other muscle spasm: Secondary | ICD-10-CM

## 2020-03-19 DIAGNOSIS — H81399 Other peripheral vertigo, unspecified ear: Secondary | ICD-10-CM | POA: Diagnosis not present

## 2020-03-19 DIAGNOSIS — Z1389 Encounter for screening for other disorder: Secondary | ICD-10-CM

## 2020-03-19 DIAGNOSIS — R110 Nausea: Secondary | ICD-10-CM | POA: Diagnosis not present

## 2020-03-19 DIAGNOSIS — Z0000 Encounter for general adult medical examination without abnormal findings: Secondary | ICD-10-CM | POA: Diagnosis not present

## 2020-03-19 DIAGNOSIS — M25561 Pain in right knee: Secondary | ICD-10-CM

## 2020-03-19 DIAGNOSIS — M5412 Radiculopathy, cervical region: Secondary | ICD-10-CM

## 2020-03-19 DIAGNOSIS — G8929 Other chronic pain: Secondary | ICD-10-CM

## 2020-03-19 DIAGNOSIS — E538 Deficiency of other specified B group vitamins: Secondary | ICD-10-CM | POA: Diagnosis not present

## 2020-03-19 DIAGNOSIS — C50012 Malignant neoplasm of nipple and areola, left female breast: Secondary | ICD-10-CM

## 2020-03-19 DIAGNOSIS — E611 Iron deficiency: Secondary | ICD-10-CM | POA: Diagnosis not present

## 2020-03-19 DIAGNOSIS — Z13220 Encounter for screening for lipoid disorders: Secondary | ICD-10-CM

## 2020-03-19 DIAGNOSIS — M79661 Pain in right lower leg: Secondary | ICD-10-CM | POA: Diagnosis not present

## 2020-03-19 DIAGNOSIS — Z170 Estrogen receptor positive status [ER+]: Secondary | ICD-10-CM

## 2020-03-19 DIAGNOSIS — W19XXXA Unspecified fall, initial encounter: Secondary | ICD-10-CM

## 2020-03-19 LAB — LIPID PANEL
Cholesterol: 315 mg/dL — ABNORMAL HIGH (ref 0–200)
HDL: 53.1 mg/dL (ref >39.00)
LDL Cholesterol: 227 mg/dL — ABNORMAL HIGH (ref 0–99)
NonHDL: 261.54
Total CHOL/HDL Ratio: 6
Triglycerides: 172 mg/dL — ABNORMAL HIGH (ref 0.0–149.0)
VLDL: 34.4 mg/dL (ref 0.0–40.0)

## 2020-03-19 LAB — VITAMIN D 25 HYDROXY (VIT D DEFICIENCY, FRACTURES): VITD: 49.68 ng/mL (ref 30.00–100.00)

## 2020-03-19 LAB — TSH: TSH: 1.87 u[IU]/mL (ref 0.35–4.50)

## 2020-03-19 LAB — VITAMIN B12: Vitamin B-12: 943 pg/mL — ABNORMAL HIGH (ref 211–911)

## 2020-03-19 LAB — HEMOGLOBIN A1C: Hgb A1c MFr Bld: 5.6 % (ref 4.6–6.5)

## 2020-03-19 MED ORDER — MECLIZINE HCL 12.5 MG PO TABS
12.5000 mg | ORAL_TABLET | Freq: Three times a day (TID) | ORAL | 1 refills | Status: DC | PRN
Start: 2020-03-19 — End: 2020-10-23

## 2020-03-19 MED ORDER — OMEPRAZOLE 40 MG PO CPDR: 40.0000 mg | DELAYED_RELEASE_CAPSULE | Freq: Every evening | ORAL | 3 refills | Status: DC

## 2020-03-19 MED ORDER — MELOXICAM 15 MG PO TABS: 15.0000 mg | ORAL_TABLET | Freq: Every day | ORAL | 3 refills | Status: DC | PRN

## 2020-03-19 MED ORDER — METHOCARBAMOL 750 MG PO TABS: 750.0000 mg | ORAL_TABLET | Freq: Four times a day (QID) | ORAL | 5 refills | Status: DC | PRN

## 2020-03-19 NOTE — Patient Instructions (Addendum)
Volaren/diclofenac 4x per day  [employer]   calcium 600 mg 1-2 x per day and vitamin D3 5000  Knee Exercises Ask your health care provider which exercises are safe for you. Do exercises exactly as told by your health care provider and adjust them as directed. It is normal to feel mild stretching, pulling, tightness, or discomfort as you do these exercises. Stop right away if you feel sudden pain or your pain gets worse. Do not begin these exercises until told by your health care provider. Stretching and range-of-motion exercises These exercises warm up your muscles and joints and improve the movement and flexibility of your knee. These exercises also help to relieve pain and swelling. Knee extension, prone 1. Lie on your abdomen (prone position) on a bed. 2. Place your left / right knee just beyond the edge of the surface so your knee is not on the bed. You can put a towel under your left / right thigh just above your kneecap for comfort. 3. Relax your leg muscles and allow gravity to straighten your knee (extension). You should feel a stretch behind your left / right knee. 4. Hold this position for __________ seconds. 5. Scoot up so your knee is supported between repetitions. Repeat __________ times. Complete this exercise __________ times a day. Knee flexion, active  1. Lie on your back with both legs straight. If this causes back discomfort, bend your left / right knee so your foot is flat on the floor. 2. Slowly slide your left / right heel back toward your buttocks. Stop when you feel a gentle stretch in the front of your knee or thigh (flexion). 3. Hold this position for __________ seconds. 4. Slowly slide your left / right heel back to the starting position. Repeat __________ times. Complete this exercise __________ times a day. Quadriceps stretch, prone  1. Lie on your abdomen on a firm surface, such as a bed or padded floor. 2. Bend your left / right knee and hold your  ankle. If you cannot reach your ankle or pant leg, loop a belt around your foot and grab the belt instead. 3. Gently pull your heel toward your buttocks. Your knee should not slide out to the side. You should feel a stretch in the front of your thigh and knee (quadriceps). 4. Hold this position for __________ seconds. Repeat __________ times. Complete this exercise __________ times a day. Hamstring, supine 1. Lie on your back (supine position). 2. Loop a belt or towel over the ball of your left / right foot. The ball of your foot is on the walking surface, right under your toes. 3. Straighten your left / right knee and slowly pull on the belt to raise your leg until you feel a gentle stretch behind your knee (hamstring). ? Do not let your knee bend while you do this. ? Keep your other leg flat on the floor. 4. Hold this position for __________ seconds. Repeat __________ times. Complete this exercise __________ times a day. Strengthening exercises These exercises build strength and endurance in your knee. Endurance is the ability to use your muscles for a long time, even after they get tired. Quadriceps, isometric This exercise stretches the muscles in front of your thigh (quadriceps) without moving your knee joint (isometric). 1. Lie on your back with your left / right leg extended and your other knee bent. Put a rolled towel or small pillow under your knee if told by your health care provider. 2. Slowly tense  the muscles in the front of your left / right thigh. You should see your kneecap slide up toward your hip or see increased dimpling just above the knee. This motion will push the back of the knee toward the floor. 3. For __________ seconds, hold the muscle as tight as you can without increasing your pain. 4. Relax the muscles slowly and completely. Repeat __________ times. Complete this exercise __________ times a day. Straight leg raises This exercise stretches the muscles in front of  your thigh (quadriceps) and the muscles that move your hips (hip flexors). 1. Lie on your back with your left / right leg extended and your other knee bent. 2. Tense the muscles in the front of your left / right thigh. You should see your kneecap slide up or see increased dimpling just above the knee. Your thigh may even shake a bit. 3. Keep these muscles tight as you raise your leg 4-6 inches (10-15 cm) off the floor. Do not let your knee bend. 4. Hold this position for __________ seconds. 5. Keep these muscles tense as you lower your leg. 6. Relax your muscles slowly and completely after each repetition. Repeat __________ times. Complete this exercise __________ times a day. Hamstring, isometric 1. Lie on your back on a firm surface. 2. Bend your left / right knee about __________ degrees. 3. Dig your left / right heel into the surface as if you are trying to pull it toward your buttocks. Tighten the muscles in the back of your thighs (hamstring) to "dig" as hard as you can without increasing any pain. 4. Hold this position for __________ seconds. 5. Release the tension gradually and allow your muscles to relax completely for __________ seconds after each repetition. Repeat __________ times. Complete this exercise __________ times a day. Hamstring curls If told by your health care provider, do this exercise while wearing ankle weights. Begin with __________ lb weights. Then increase the weight by 1 lb (0.5 kg) increments. Do not wear ankle weights that are more than __________ lb. 1. Lie on your abdomen with your legs straight. 2. Bend your left / right knee as far as you can without feeling pain. Keep your hips flat against the floor. 3. Hold this position for __________ seconds. 4. Slowly lower your leg to the starting position. Repeat __________ times. Complete this exercise __________ times a day. Squats This exercise strengthens the muscles in front of your thigh and knee  (quadriceps). 1. Stand in front of a table, with your feet and knees pointing straight ahead. You may rest your hands on the table for balance but not for support. 2. Slowly bend your knees and lower your hips like you are going to sit in a chair. ? Keep your weight over your heels, not over your toes. ? Keep your lower legs upright so they are parallel with the table legs. ? Do not let your hips go lower than your knees. ? Do not bend lower than told by your health care provider. ? If your knee pain increases, do not bend as low. 3. Hold the squat position for __________ seconds. 4. Slowly push with your legs to return to standing. Do not use your hands to pull yourself to standing. Repeat __________ times. Complete this exercise __________ times a day. Wall slides This exercise strengthens the muscles in front of your thigh and knee (quadriceps). 1. Lean your back against a smooth wall or door, and walk your feet out 18-24 inches (46-61 cm)  from it. 2. Place your feet hip-width apart. 3. Slowly slide down the wall or door until your knees bend __________ degrees. Keep your knees over your heels, not over your toes. Keep your knees in line with your hips. 4. Hold this position for __________ seconds. Repeat __________ times. Complete this exercise __________ times a day. Straight leg raises This exercise strengthens the muscles that rotate the leg at the hip and move it away from your body (hip abductors). 1. Lie on your side with your left / right leg in the top position. Lie so your head, shoulder, knee, and hip line up. You may bend your bottom knee to help you keep your balance. 2. Roll your hips slightly forward so your hips are stacked directly over each other and your left / right knee is facing forward. 3. Leading with your heel, lift your top leg 4-6 inches (10-15 cm). You should feel the muscles in your outer hip lifting. ? Do not let your foot drift forward. ? Do not let your knee  roll toward the ceiling. 4. Hold this position for __________ seconds. 5. Slowly return your leg to the starting position. 6. Let your muscles relax completely after each repetition. Repeat __________ times. Complete this exercise __________ times a day. Straight leg raises This exercise stretches the muscles that move your hips away from the front of the pelvis (hip extensors). 1. Lie on your abdomen on a firm surface. You can put a pillow under your hips if that is more comfortable. 2. Tense the muscles in your buttocks and lift your left / right leg about 4-6 inches (10-15 cm). Keep your knee straight as you lift your leg. 3. Hold this position for __________ seconds. 4. Slowly lower your leg to the starting position. 5. Let your leg relax completely after each repetition. Repeat __________ times. Complete this exercise __________ times a day. This information is not intended to replace advice given to you by your health care provider. Make sure you discuss any questions you have with your health care provider. Document Revised: 12/19/2017 Document Reviewed: 12/19/2017 Elsevier Patient Education  2020 [employer].

## 2020-03-19 NOTE — Progress Notes (Signed)
Chief Complaint  Patient presents with  . Follow-up   Annual  1. Breast cancer in remission doing well will be on arimedex x 10 years per Dr. Janese Banks and on Zometa Q6 months f/u due 05/2020  2. Chronic neck pain and c/o R>L tingling but this week L>R upper arm tingling EMG 07/23/2018 C6 radiculopathy  3. Fall at work 03/17/20 and hurt right knee pain 7/10 tried mobic some relief sits at work so not particularly bothersome at work has new job at car [place]: Negative for weight loss.  HENT: Negative for hearing loss.   Eyes: Negative for blurred vision.  Respiratory: Negative for shortness of breath.   Cardiovascular: Negative for chest pain.  Gastrointestinal: Negative for abdominal pain.  Musculoskeletal: Positive for neck pain.  Skin: Negative for rash.  Neurological: Positive for sensory change. Negative for headaches.  Psychiatric/Behavioral: Negative for depression.   Past Medical History:  Diagnosis Date  . Anxiety   . Asthma    as a child  . Breast cancer ([place])   . Carpal tunnel syndrome    R hand   . Colon polyps   . Depression   . Eosinophilic esophagitis    noted in [place] with GI path report 09/29/14   . Esophageal stricture    s/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 Edwin Cap   . Family history of breast cancer   . Family history of prostate cancer   . Herniated disc, cervical    s/p epidural injection  . Herpes   . History of kidney stones   . Hyperlipidemia   . Low back pain   . Migraines   . Personal history of chemotherapy   . Personal history of radiation therapy    Past Surgical History:  Procedure Laterality Date  . BREAST BIOPSY Left 12/25/2018   [place] BX, invasive mammary carcinoma   . BREAST LUMPECTOMY    . BREAST LUMPECTOMY WITH NEEDLE LOCALIZATION AND AXILLARY SENTINEL LYMPH NODE BX Left 01/16/2019   Procedure: LEFT BREAST LUMPECTOMY WITH NEEDLE LOCALIZATION AND SENTINEL LYMPH NODE BIOPSY;   Surgeon: Jovita Kussmaul, MD;  Location: [facility] ORS;  Service: General;  Laterality: Left;  . CESAREAN SECTION    . COLONOSCOPY WITH PROPOFOL N/A 03/27/2017   Procedure: COLONOSCOPY WITH PROPOFOL;  Surgeon: Jonathon Bellows, MD;  Location[facility] ENDOSCOPY;  Service: Gastroenterology;  Laterality: N/A;  . ESOPHAGOGASTRODUODENOSCOPY    . ESOPHAGOGASTRODUODENOSCOPY (EGD) WITH PROPOFOL N/A 03/27/2017   Procedure: ESOPHAGOGASTRODUODENOSCOPY (EGD) WITH PROPOFOL WITH DILATION;  Surgeon: Jonathon Bellows, MD;  Location: [facility] ENDOSCOPY;  Service: Gastroenterology;  Laterality: N/A;  . PORTACATH PLACEMENT N/A 02/15/2019   Procedure: INSERTION PORT-A-CATH WITH POSSIBLE ULTRASOUND;  Surgeon: Jovita Kussmaul, MD;  Location: [facility] ORS;  Service: General;  Laterality: N/A;  . RE-EXCISION OF BREAST CANCER,SUPERIOR MARGINS Left 02/15/2019   Procedure: RE-EXCISION OF LEFT BREAST CANCER ANTERIOR MARGINS;  Surgeon: Jovita Kussmaul, MD;  Location: [facility] ORS;  Service: General;  Laterality: Left;  . THROAT SURGERY  2015  . UMBILICAL HERNIA REPAIR  2006    x 2   w mesh per pt   Family History  Problem Relation Age of Onset  . Breast cancer Mother        dx late 20s  . Thyroid disease Mother   . Colon polyps Mother   . Prostate cancer Father        dx 7  . Breast cancer Maternal Grandmother  dx late 46s  . Cancer Maternal Uncle        possibly, unsure type   Social History   Socioeconomic History  . Marital status: Single    Spouse name: Not on file  . Number of children: Not on file  . Years of education: Not on file  . Highest education level: Not on file  Occupational History  . Not on file  Tobacco Use  . Smoking status: Former Smoker    Packs/day: 1.00    Years: 20.00    Pack years: 20.00    Quit date: 06/02/2006    Years since quitting: 13.8  . Smokeless tobacco: Never Used  Vaping Use  . Vaping Use: Never used  Substance and Sexual Activity  . Alcohol use: Not Currently  . Drug use: No  . Sexual  activity: Yes    Partners: Male  Other Topics Concern  . 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 As of 02/26/17    Divorced.    Former smoker 20+ years quit in 1997 then started again for 5 years and quit 10-12 years ago as of 03/08/17    Social Determinants of Health   Financial Resource Strain: Not on file  Food Insecurity: Not on file  Transportation Needs: Not on file  Physical Activity: Not on file  Stress: Not on file  Social Connections: Not on file  Intimate Partner Violence: Not on file   No outpatient medications have been marked as taking for the 03/19/20 encounter (Office Visit) with McLean-Scocuzza, Nino Glow, MD.   Allergies  Allergen Reactions  . Hydrocodone-Acetaminophen Hives    All over her body.  . Lactose Intolerance (Gi) Other (See Comments)    Bloating and GI upset  . Penicillins Rash    Did it involve swelling of the face/tongue/throat, SOB, or low BP? Unknown Did it involve sudden or severe rash/hives, skin peeling, or any reaction on the inside of your mouth or nose? Yes Did you need to seek medical attention at a hospital or doctor's office? Unknown When did it last happen? Childhood reaction. If all above answers are "NO", may proceed with cephalosporin use.   . Sulfa Antibiotics Rash    Full body rash    Recent Results (from the past 2160 hour(s))  Comprehensive metabolic panel     Status: Abnormal   Collection Time: 02/17/20 10:28 AM  Result Value Ref Range   Sodium 138 135 - 145 mmol/L   Potassium 3.7 3.5 - 5.1 mmol/L   Chloride 100 98 - 111 mmol/L   CO2 29 22 - 32 mmol/L   Glucose, Bld 124 (H) 70 - 99 mg/dL    Comment: Glucose reference range applies only to samples taken after fasting for at least 8 hours.   BUN 10 6 - 20 mg/dL   Creatinine, Ser 0.75 0.44 - 1.00 mg/dL   Calcium 9.4 8.9 - 10.3 mg/dL    Total Protein 7.4 6.5 - 8.1 g/dL   Albumin 4.3 3.5 - 5.0 g/dL   AST 19 15 - 41 U/L   ALT 21 0 - 44 U/L   Alkaline Phosphatase 49 38 - 126 U/L   Total Bilirubin 0.8 0.3 - 1.2 mg/dL   GFR, Estimated >60 >60 mL/min    Comment: (NOTE) Calculated using the CKD-EPI  Creatinine Equation (2021)    Anion gap 9 5 - 15    Comment: Performed at [facility], [address] [place], [place] [address]  CBC with Differential/Platelet     Status: Abnormal   Collection Time: 02/17/20 10:28 AM  Result Value Ref Range   WBC 6.7 4.0 - 10.5 K/uL   RBC 4.92 3.87 - 5.11 MIL/uL   Hemoglobin 15.6 (H) 12.0 - 15.0 g/dL   HCT 43.3 36.0 - 46.0 %   MCV 88.0 80.0 - 100.0 fL   MCH 31.7 26.0 - 34.0 pg   MCHC 36.0 30.0 - 36.0 g/dL   RDW 11.4 (L) 11.5 - 15.5 %   Platelets 310 150 - 400 K/uL   nRBC 0.0 0.0 - 0.2 %   Neutrophils Relative % 68 %   Neutro Abs 4.5 1.7 - 7.7 K/uL   Lymphocytes Relative 18 %   Lymphs Abs 1.2 0.7 - 4.0 K/uL   Monocytes Relative 11 %   Monocytes Absolute 0.7 0.1 - 1.0 K/uL   Eosinophils Relative 2 %   Eosinophils Absolute 0.2 0.0 - 0.5 K/uL   Basophils Relative 1 %   Basophils Absolute 0.1 0.0 - 0.1 K/uL   Immature Granulocytes 0 %   Abs Immature Granulocytes 0.02 0.00 - 0.07 K/uL    Comment: Performed at [facility], [address] [place], [place] [address]   Objective  Body mass index is 31.64 kg/m. Wt Readings from Last 3 Encounters:  03/19/20 178 lb 9.6 oz (81 kg)  02/17/20 176 lb 11.2 oz (80.2 kg)  11/27/19 185 lb 12.8 oz (84.3 kg)   Temp Readings from Last 3 Encounters:  03/19/20 98.2 F (36.8 C) (Oral)  02/17/20 99 F (37.2 C) (Oral)  11/27/19 (!) 96.7 F (35.9 C) (Tympanic)   BP Readings from Last 3 Encounters:  03/19/20 118/70  02/17/20 125/89  11/27/19 (!) 142/90   Pulse Readings from Last 3 Encounters:  03/19/20 85  02/17/20 91  11/27/19 96    Physical Exam Vitals and nursing note reviewed.  Constitutional:      Appearance: Normal  appearance. She is well-developed and well-groomed. She is obese.  HENT:     Head: Normocephalic and atraumatic.  Eyes:     Conjunctiva/sclera: Conjunctivae normal.     Pupils: Pupils are equal, round, and reactive to light.  Cardiovascular:     Rate and Rhythm: Normal rate and regular rhythm.     Heart sounds: Normal heart sounds. No murmur heard.   Pulmonary:     Effort: Pulmonary effort is normal.     Breath sounds: Normal breath sounds.  Abdominal:     General: Abdomen is flat. Bowel sounds are normal.     Tenderness: There is no abdominal tenderness.  Musculoskeletal:     Right knee: Swelling present. Tenderness present over the medial joint line and patellar tendon.       Legs:  Skin:    General: Skin is warm and dry.  Neurological:     General: No focal deficit present.     Mental Status: She is alert and oriented to person, place, and time. Mental status is at baseline.     Gait: Gait normal.  Psychiatric:        Attention and Perception: Attention and perception normal.        Mood and Affect: Mood and affect normal.        Speech: Speech normal.  Behavior: Behavior normal. Behavior is cooperative.        Thought Content: Thought content normal.        Cognition and Memory: Cognition and memory normal.        Judgment: Judgment normal.     Assessment  Plan  Annual physical exam Had flu shot had 12/2019 [facility] Tdap12/31/19 rec hep B vaccinewill call back to sch rec MMR vaccine covid 19 vaccine not had had covid 19 + declines  EGD/colonoscopy had 03/27/17 Dr. Bailey Mech diverticulosis, serrated polyps neg EGD for eosinophillic esophagitis repeat colonoscopy due in 5years -refilled PPI   rec D3 5000 IU daily  mammo 10/10/17 negativeFH breast cancer in mom and m gm in 83s.consider genetic testing in future' \mammogramhad'  10/12/18 needs left mammo and UShad 12/18/18 abnormal + breast cancer/left 11/15/19 mammgram negative   DEXA osteopenia T -1.2  calcium 600 mg 1-2 x per day and vitamin D3 5000  Pap1/22/19 h/o abnormal neg pap neg HPV Former smoker congratulated on quitting  NS saw 01/23/18 cervical spinal cord kyphosis, cervical myelopathy f/u in 4 months referral to pm&R for pain tx Dr. Remo Lipps cook   MRI 10/10/18 abnormal neurology does not think MS rec [facility]  [facility] seen 01/04/19 stage 1 left breast cancer ER+ Dr. Marlou Starks breast conservation surgery and sentinel node bx f/u with h/o after surgery  CC Dr. Manuella Ghazi to see when due to f/u neurology  rec healthy diet and exercise   Muscle spasm - Plan: methocarbamol (ROBAXIN) 750 MG tablet  Acute pain of right knee - Plan: DG Knee Complete 4 Views Right  Other peripheral vertigo, unspecified ear - Plan: meclizine (ANTIVERT) 12.5 MG tablet  Nausea - Plan: meclizine (ANTIVERT) 12.5 MG tabletl  Fall, initial encounter right knee and lower leg pain  Xray right knee and right lower leg  Right knee brace   C6 radiculopathy Cervical radiculopathy with chronic neck pain And above consider referral [facility] knee vs Dr. Sharlet Salina for right knee and b/l hand numbness R>L now L>R ? Component of neuropathy  Malignant neoplasm of nipple of left breast in female, estrogen receptor positive ([place]) in remission In remission on arimedex x 10 years and zometa Q6 months     Provider: Dr. Olivia Mackie McLean-Scocuzza-Internal Medicine

## 2020-03-20 LAB — IRON,TIBC AND FERRITIN PANEL
%SAT: 33 % (calc) (ref 16–45)
Ferritin: 35 ng/mL (ref 16–232)
Iron: 98 ug/dL (ref 45–160)
TIBC: 298 mcg/dL (calc) (ref 250–450)

## 2020-03-23 ENCOUNTER — Telehealth: Payer: Self-pay | Admitting: Internal Medicine

## 2020-03-23 NOTE — Telephone Encounter (Signed)
Iron normal  Vitamin D normal continue same dose of vitamin D3 otc  B12 elevated  Cholesterol elevated  -mail cholesterol handout  -is she agreeable to try statin for cholesterol ? Thyroid lab normal  A1C no prediabetes Urine pending

## 2020-03-23 NOTE — Telephone Encounter (Signed)
Patient informed and verbalized understanding.  Handout mailed.  Patient agreeable to statin medication

## 2020-03-25 ENCOUNTER — Encounter: Payer: Self-pay | Admitting: Internal Medicine

## 2020-03-26 ENCOUNTER — Other Ambulatory Visit: Payer: Self-pay | Admitting: Internal Medicine

## 2020-03-26 DIAGNOSIS — E785 Hyperlipidemia, unspecified: Secondary | ICD-10-CM

## 2020-03-26 MED ORDER — ATORVASTATIN CALCIUM 10 MG PO TABS: 10.0000 mg | ORAL_TABLET | Freq: Every day | ORAL | 3 refills | Status: DC

## 2020-04-28 ENCOUNTER — Ambulatory Visit (INDEPENDENT_AMBULATORY_CARE_PROVIDER_SITE_OTHER): Payer: Medicaid Other | Admitting: Psychology

## 2020-04-28 DIAGNOSIS — F418 Other specified anxiety disorders: Secondary | ICD-10-CM | POA: Diagnosis not present

## 2020-05-22 ENCOUNTER — Inpatient Hospital Stay: Payer: Medicaid Other | Attending: Oncology

## 2020-05-22 ENCOUNTER — Encounter: Payer: Self-pay | Admitting: Oncology

## 2020-05-22 ENCOUNTER — Inpatient Hospital Stay: Payer: Medicaid Other

## 2020-05-22 ENCOUNTER — Inpatient Hospital Stay (HOSPITAL_BASED_OUTPATIENT_CLINIC_OR_DEPARTMENT_OTHER): Payer: Medicaid Other | Admitting: Oncology

## 2020-05-22 VITALS — BP 118/84 | HR 84 | Temp 97.3°F | Wt 171.7 lb

## 2020-05-22 DIAGNOSIS — Z803 Family history of malignant neoplasm of breast: Secondary | ICD-10-CM | POA: Diagnosis not present

## 2020-05-22 DIAGNOSIS — Z8042 Family history of malignant neoplasm of prostate: Secondary | ICD-10-CM | POA: Insufficient documentation

## 2020-05-22 DIAGNOSIS — Z79899 Other long term (current) drug therapy: Secondary | ICD-10-CM | POA: Diagnosis not present

## 2020-05-22 DIAGNOSIS — Z87891 Personal history of nicotine dependence: Secondary | ICD-10-CM | POA: Insufficient documentation

## 2020-05-22 DIAGNOSIS — Z79811 Long term (current) use of aromatase inhibitors: Secondary | ICD-10-CM | POA: Insufficient documentation

## 2020-05-22 DIAGNOSIS — Z923 Personal history of irradiation: Secondary | ICD-10-CM | POA: Insufficient documentation

## 2020-05-22 DIAGNOSIS — Z08 Encounter for follow-up examination after completed treatment for malignant neoplasm: Secondary | ICD-10-CM

## 2020-05-22 DIAGNOSIS — Z853 Personal history of malignant neoplasm of breast: Secondary | ICD-10-CM

## 2020-05-22 DIAGNOSIS — G629 Polyneuropathy, unspecified: Secondary | ICD-10-CM | POA: Insufficient documentation

## 2020-05-22 DIAGNOSIS — Z7983 Long term (current) use of bisphosphonates: Secondary | ICD-10-CM

## 2020-05-22 DIAGNOSIS — Z170 Estrogen receptor positive status [ER+]: Secondary | ICD-10-CM

## 2020-05-22 DIAGNOSIS — Z8719 Personal history of other diseases of the digestive system: Secondary | ICD-10-CM | POA: Insufficient documentation

## 2020-05-22 DIAGNOSIS — Z5181 Encounter for therapeutic drug level monitoring: Secondary | ICD-10-CM

## 2020-05-22 DIAGNOSIS — C50212 Malignant neoplasm of upper-inner quadrant of left female breast: Secondary | ICD-10-CM | POA: Diagnosis not present

## 2020-05-22 DIAGNOSIS — R232 Flushing: Secondary | ICD-10-CM | POA: Insufficient documentation

## 2020-05-22 DIAGNOSIS — G620 Drug-induced polyneuropathy: Secondary | ICD-10-CM | POA: Insufficient documentation

## 2020-05-22 DIAGNOSIS — Z9221 Personal history of antineoplastic chemotherapy: Secondary | ICD-10-CM | POA: Diagnosis not present

## 2020-05-22 LAB — COMPREHENSIVE METABOLIC PANEL
ALT: 18 U/L (ref 0–44)
AST: 19 U/L (ref 15–41)
Albumin: 3.8 g/dL (ref 3.5–5.0)
Alkaline Phosphatase: 60 U/L (ref 38–126)
Anion gap: 9 (ref 5–15)
BUN: 10 mg/dL (ref 6–20)
CO2: 26 mmol/L (ref 22–32)
Calcium: 9.3 mg/dL (ref 8.9–10.3)
Chloride: 105 mmol/L (ref 98–111)
Creatinine, Ser: 0.75 mg/dL (ref 0.44–1.00)
GFR, Estimated: >60 mL/min (ref >60)
Glucose, Bld: 106 mg/dL — ABNORMAL HIGH (ref 70–99)
Potassium: 3.7 mmol/L (ref 3.5–5.1)
Sodium: 140 mmol/L (ref 135–145)
Total Bilirubin: 0.7 mg/dL (ref 0.3–1.2)
Total Protein: 6.8 g/dL (ref 6.5–8.1)

## 2020-05-22 LAB — CBC WITH DIFFERENTIAL/PLATELET
Abs Immature Granulocytes: 0.01 10*3/uL (ref 0.00–0.07)
Basophils Absolute: 0.1 10*3/uL (ref 0.0–0.1)
Basophils Relative: 1 %
Eosinophils Absolute: 0.2 10*3/uL (ref 0.0–0.5)
Eosinophils Relative: 4 %
HCT: 39.9 % (ref 36.0–46.0)
Hemoglobin: 14.3 g/dL (ref 12.0–15.0)
Immature Granulocytes: 0 %
Lymphocytes Relative: 21 %
Lymphs Abs: 1.4 10*3/uL (ref 0.7–4.0)
MCH: 32.6 pg (ref 26.0–34.0)
MCHC: 35.8 g/dL (ref 30.0–36.0)
MCV: 91.1 fL (ref 80.0–100.0)
Monocytes Absolute: 0.7 10*3/uL (ref 0.1–1.0)
Monocytes Relative: 10 %
Neutro Abs: 4.2 10*3/uL (ref 1.7–7.7)
Neutrophils Relative %: 64 %
Platelets: 293 10*3/uL (ref 150–400)
RBC: 4.38 MIL/uL (ref 3.87–5.11)
RDW: 11.9 % (ref 11.5–15.5)
WBC: 6.5 10*3/uL (ref 4.0–10.5)
nRBC: 0 % (ref 0.0–0.2)

## 2020-05-22 MED ORDER — ZOLEDRONIC ACID 4 MG/100ML IV SOLN
4.0000 mg | INTRAVENOUS | Status: DC
  Administered 2020-05-22: 4 mg via INTRAVENOUS

## 2020-05-22 MED ORDER — SODIUM CHLORIDE 0.9 % IV SOLN
Freq: Once | INTRAVENOUS | Status: AC
  Filled 2020-05-22: qty 250

## 2020-05-22 NOTE — Progress Notes (Signed)
Pt received prescribed treatment in clinic, pt stable at d/c. 

## 2020-05-23 NOTE — Progress Notes (Signed)
Hematology/Oncology Consult note [facility]  Telephone:([phone] [phone]  Patient Care Team: McLean-Scocuzza, Nino Glow, MD as PCP - General (Internal Medicine) Jovita Kussmaul, MD as Consulting Physician (General Surgery) Noreene Filbert, MD as Referring Physician (Radiation Oncology) Rico Junker, RN as Registered Nurse Sindy Guadeloupe, MD as Consulting Physician (Oncology)   Name of the patient: Leah [name]  [id number]  1965/11/08   Date of visit: 05/23/20  Diagnosis-  pathological prognostic stage Ia invasive mammary carcinomapT1b pN1 cM0 ER/PR positive HER-2/neu negative s/p lumpectomy, adjuvant chemotherapy and radiation currently on Arimidex   Chief complaint/ Reason for visit-routine follow-up of breast cancer on Arimidex   Heme/Onc history: Patient is a 55 year old postmenopausal female G2 P2 L2 who recently underwent screening bilateral mammogram which showed area of concern in her left breast. This was followed by an ultrasound and Core biopsy. Ultrasound showed 1 x 1.1 x 0.7 cm hypoechoic mass at the 10:30 position of the left breast. Left axilla appeared normal. 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  Final pathology showed 9 mm grade 1 invasive mammary carcinoma1 out of 2 lymph nodes involved with macro metastatic carcinoma 3.5 mm. Anterior margin was focally positive for which patientunderwent reexcision surgery with negative marginsPT1BPN1a (sn)  MammaPrint score came back as high risk with a risk of recurrence of 29% at 10 years without chemotherapy  Patient completed 4 cycles of dose dense AC chemotherapy and 12 cycles of weekly Taxol.  She was started on letrozole in August 2021 but could not tolerate it due to diarrhea and is currently on Arimidex  Interval history-patient reports doing well overall.  Neuropathy has decreased.  Appetite  and weight have remained stable.  She is trying to exercise to lose weight.  She has lost 7 pounds in the last 2 months  ECOG PS- 0 Pain scale- 0   Review of systems- Review of Systems  Constitutional: Negative for chills, fever, malaise/fatigue and weight loss.  HENT: Negative for congestion, ear discharge and nosebleeds.   Eyes: Negative for blurred vision.  Respiratory: Negative for cough, hemoptysis, sputum production, shortness of breath and wheezing.   Cardiovascular: Negative for chest pain, palpitations, orthopnea and claudication.  Gastrointestinal: Negative for abdominal pain, blood in stool, constipation, diarrhea, heartburn, melena, nausea and vomiting.  Genitourinary: Negative for dysuria, flank pain, frequency, hematuria and urgency.  Musculoskeletal: Negative for back pain, joint pain and myalgias.  Skin: Negative for rash.  Neurological: Positive for sensory change (Peripheral neuropathy). Negative for dizziness, tingling, focal weakness, seizures, weakness and headaches.  Endo/Heme/Allergies: Does not bruise/bleed easily.  Psychiatric/Behavioral: Negative for depression and suicidal ideas. The patient does not have insomnia.       Allergies  Allergen Reactions  . Hydrocodone-Acetaminophen Hives    All over her body.  . Lactose Intolerance (Gi) Other (See Comments)    Bloating and GI upset  . Penicillins Rash    Did it involve swelling of the face/tongue/throat, SOB, or low BP? Unknown Did it involve sudden or severe rash/hives, skin peeling, or any reaction on the inside of your mouth or nose? Yes Did you need to seek medical attention at a hospital or doctor's office? Unknown When did it last happen? Childhood reaction. If all above answers are "NO", may proceed with cephalosporin use.   . Sulfa Antibiotics Rash    Full body rash      Past Medical History:  Diagnosis Date  . Anxiety   . Asthma    as a child  . Breast cancer ([place])   . Carpal tunnel  syndrome    R hand   . Colon polyps   . Depression   . Eosinophilic esophagitis    noted in [place] with GI path report 09/29/14   . Esophageal stricture    s/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 Edwin Cap   . Family history of breast cancer   . Family history of prostate cancer   . Herniated disc, cervical    s/p epidural injection  . Herpes   . History of kidney stones   . Hyperlipidemia   . Low back pain   . Migraines   . Personal history of chemotherapy   . Personal history of radiation therapy      Past Surgical History:  Procedure Laterality Date  . BREAST BIOPSY Left 12/25/2018   [place] BX, invasive mammary carcinoma   . BREAST LUMPECTOMY    . BREAST LUMPECTOMY WITH NEEDLE LOCALIZATION AND AXILLARY SENTINEL LYMPH NODE BX Left 01/16/2019   Procedure: LEFT BREAST LUMPECTOMY WITH NEEDLE LOCALIZATION AND SENTINEL LYMPH NODE BIOPSY;  Surgeon: Jovita Kussmaul, MD;  Location: [facility] ORS;  Service: General;  Laterality: Left;  . CESAREAN SECTION    . COLONOSCOPY WITH PROPOFOL N/A 03/27/2017   Procedure: COLONOSCOPY WITH PROPOFOL;  Surgeon: Jonathon Bellows, MD;  Location: [facility] ENDOSCOPY;  Service: Gastroenterology;  Laterality: N/A;  . ESOPHAGOGASTRODUODENOSCOPY    . ESOPHAGOGASTRODUODENOSCOPY (EGD) WITH PROPOFOL N/A 03/27/2017   Procedure: ESOPHAGOGASTRODUODENOSCOPY (EGD) WITH PROPOFOL WITH DILATION;  Surgeon: Jonathon Bellows, MD;  Location: [facility] ENDOSCOPY;  Service: Gastroenterology;  Laterality: N/A;  . PORTACATH PLACEMENT N/A 02/15/2019   Procedure: INSERTION PORT-A-CATH WITH POSSIBLE ULTRASOUND;  Surgeon: Jovita Kussmaul, MD;  Location: [facility] ORS;  Service: General;  Laterality: N/A;  . RE-EXCISION OF BREAST CANCER,SUPERIOR MARGINS Left 02/15/2019   Procedure: RE-EXCISION OF LEFT BREAST CANCER ANTERIOR MARGINS;  Surgeon: Jovita Kussmaul, MD;  Location: [facility] ORS;  Service: General;  Laterality: Left;  . THROAT SURGERY  2015  . UMBILICAL HERNIA REPAIR  2006    x 2   w mesh per  pt    Social History   Socioeconomic History  . Marital status: Single    Spouse name: Not on file  . Number of children: Not on file  . Years of education: Not on file  . Highest education level: Not on file  Occupational History  . Not on file  Tobacco Use  . Smoking status: Former Smoker    Packs/day: 1.00    Years: 20.00    Pack years: 20.00    Quit date: 06/02/2006    Years since quitting: 13.9  . Smokeless tobacco: Never Used  Vaping Use  . Vaping Use: Never used  Substance and Sexual Activity  . Alcohol use: Not Currently  . Drug use: No  . Sexual activity: Yes    Partners: Male  Other Topics Concern  . 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 As of 02/26/17    Divorced.    Former smoker 20+ years quit in 1997 then started again for 5 years and quit 10-12 years ago as of 03/08/17    Social Determinants of Radio broadcast assistant  Strain: Not on file  Food Insecurity: Not on file  Transportation Needs: Not on file  Physical Activity: Not on file  Stress: Not on file  Social Connections: Not on file  Intimate Partner Violence: Not on file    Family History  Problem Relation Age of Onset  . Breast cancer Mother        dx late 56s  . Thyroid disease Mother   . Colon polyps Mother   . Prostate cancer Father        dx 52  . Breast cancer Maternal Grandmother        dx late 11s  . Cancer Maternal Uncle        possibly, unsure type     Current Outpatient Medications:  .  acetaminophen (TYLENOL) 500 MG tablet, Take 500-1,000 mg by mouth every 6 (six) hours as needed (for pain.)., Disp: , Rfl:  .  anastrozole (ARIMIDEX) 1 MG tablet, TAKE 1 TABLET BY MOUTH EVERY DAY, Disp: 90 tablet, Rfl: 2 .  atorvastatin (LIPITOR) 10 MG tablet, Take 1 tablet (10 mg total) by mouth daily., Disp: 90 tablet, Rfl: 3 .  Cholecalciferol (VITAMIN  D-3) 125 MCG (5000 UT) TABS, Take 5,000 Units by mouth daily., Disp: , Rfl:  .  Cyanocobalamin (VITAMIN B-12 PO), Take 1 tablet by mouth daily. 1017m daily, Disp: , Rfl:  .  gabapentin (NEURONTIN) 300 MG capsule, Take 1 capsule (300 mg total) by mouth 3 (three) times daily., Disp: 90 capsule, Rfl: 2 .  meclizine (ANTIVERT) 12.5 MG tablet, Take 1 tablet (12.5 mg total) by mouth 3 (three) times daily as needed for dizziness., Disp: 90 tablet, Rfl: 1 .  meloxicam (MOBIC) 15 MG tablet, Take 1 tablet (15 mg total) by mouth daily as needed for pain., Disp: 90 tablet, Rfl: 3 .  methocarbamol (ROBAXIN) 750 MG tablet, Take 1 tablet (750 mg total) by mouth every 6 (six) hours as needed for muscle spasms., Disp: 120 tablet, Rfl: 5 .  omeprazole (PRILOSEC) 40 MG capsule, Take 1 capsule (40 mg total) by mouth every evening., Disp: 90 capsule, Rfl: 3 .  tiZANidine (ZANAFLEX) 4 MG tablet, Take 4 mg by mouth at bedtime., Disp: , Rfl:  .  traMADol (ULTRAM) 50 MG tablet, Take 1-2 tablets (50-100 mg total) by mouth every 6 (six) hours as needed., Disp: 20 tablet, Rfl: 0 .  traZODone (DESYREL) 50 MG tablet, Take 1-1.5 tablets (50-75 mg total) by mouth at bedtime as needed for sleep., Disp: 140 tablet, Rfl: 4 .  valACYclovir (VALTREX) 1000 MG tablet, Take 1 tablet (1,000 mg total) by mouth daily. Suppression. BID X 5 days with outbreak as needed, Disp: 120 tablet, Rfl: 3 .  DULoxetine (CYMBALTA) 60 MG capsule, TAKE 1 CAPSULE BY MOUTH EVERY DAY (Patient not taking: Reported on 05/22/2020), Disp: 90 capsule, Rfl: 1 .  fluticasone (FLONASE) 50 MCG/ACT nasal spray, Place 2 sprays into both nostrils daily. Max 2 sprays (Patient not taking: No sig reported), Disp: 16 g, Rfl: 12  Current Facility-Administered Medications:  .Marland Kitchen Zoledronic Acid (ZOMETA) IVPB 4 mg, 4 mg, Intravenous, Q6 months, RSindy Guadeloupe MD, Last Rate: 400 mL/hr at 05/22/20 1254, 4 mg at 05/22/20 1254  Physical exam:  Vitals:   05/22/20 1141  BP: 118/84   Pulse: 84  Temp: (!) 97.3 F (36.3 C)  TempSrc: Tympanic  SpO2: 100%  Weight: 171 lb 11.2 oz (77.9 kg)   Physical Exam HENT:     Head: Normocephalic and atraumatic.  Eyes:     Pupils: Pupils are equal, round, and reactive to light.  Cardiovascular:     Rate and Rhythm: Normal rate and regular rhythm.     Heart sounds: Normal heart sounds.  Pulmonary:     Effort: Pulmonary effort is normal.     Breath sounds: Normal breath sounds.  Abdominal:     General: Bowel sounds are normal.     Palpations: Abdomen is soft.  Musculoskeletal:     Cervical back: Normal range of motion.  Skin:    General: Skin is warm and dry.  Neurological:     Mental Status: She is alert and oriented to person, place, and time.    Breast exam was performed in seated and lying down position. Patient is status post left lumpectomy with a well-healed surgical scar. No evidence of any palpable masses. No evidence of axillary adenopathy. No evidence of any palpable masses or lumps in the right breast. No evidence of right axillary adenopathy   CMP Latest Ref Rng & Units 05/22/2020  Glucose 70 - 99 mg/dL 106(H)  BUN 6 - 20 mg/dL 10  Creatinine 0.44 - 1.00 mg/dL 0.75  Sodium 135 - 145 mmol/L 140  Potassium 3.5 - 5.1 mmol/L 3.7  Chloride 98 - 111 mmol/L 105  CO2 22 - 32 mmol/L 26  Calcium 8.9 - 10.3 mg/dL 9.3  Total Protein 6.5 - 8.1 g/dL 6.8  Total Bilirubin 0.3 - 1.2 mg/dL 0.7  Alkaline Phos 38 - 126 U/L 60  AST 15 - 41 U/L 19  ALT 0 - 44 U/L 18   CBC Latest Ref Rng & Units 05/22/2020  WBC 4.0 - 10.5 K/uL 6.5  Hemoglobin 12.0 - 15.0 g/dL 14.3  Hematocrit 36.0 - 46.0 % 39.9  Platelets 150 - 400 K/uL 293     Assessment and plan- Patient is a 55 y.o. 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 She is s/p lumpectomywith high risk MammaPrint score.She is s/p 4 cycles  of dose dense ACand 12 cycles of weekly Taxol chemotherapy.   She is here for routine follow-up of breast cancer  Clinically patient is doing well with no concern in signs and symptoms of recurrence based on today's exam.  She will continue to take Arimidex.  She was due for Zometa today.  Calcium levels are normal and acceptable to proceed with Zometa today I will see her back in 6 months with CBC with differential CMP for next dose of Zometa.  Her mammogram would be due in September 2022 which would be ordered by Dr. Marlou Starks.   Visit Diagnosis 1. Encounter for follow-up surveillance of breast cancer   2. Encounter for monitoring zoledronic acid therapy   3. Visit for monitoring Arimidex therapy      Dr. Randa Evens, MD, MPH [facility] at [facility] [phone] 05/23/2020 7:15 PM

## 2020-05-24 ENCOUNTER — Encounter: Payer: Self-pay | Admitting: Oncology

## 2020-05-25 ENCOUNTER — Ambulatory Visit (INDEPENDENT_AMBULATORY_CARE_PROVIDER_SITE_OTHER): Payer: Medicaid Other | Admitting: Psychology

## 2020-05-25 DIAGNOSIS — F418 Other specified anxiety disorders: Secondary | ICD-10-CM

## 2020-05-29 ENCOUNTER — Encounter: Payer: Self-pay | Admitting: Radiation Oncology

## 2020-05-29 ENCOUNTER — Ambulatory Visit
Admission: RE | Admit: 2020-05-29 | Discharge: 2020-05-29 | Disposition: A | Discharge status: Home / Self Care | Payer: Medicaid Other | Source: Ambulatory Visit | Attending: Radiation Oncology | Admitting: Radiation Oncology

## 2020-05-29 ENCOUNTER — Other Ambulatory Visit: Payer: Self-pay

## 2020-05-29 VITALS — BP 128/98 | HR 118 | Temp 96.5°F | Wt 167.9 lb

## 2020-05-29 DIAGNOSIS — C50212 Malignant neoplasm of upper-inner quadrant of left female breast: Secondary | ICD-10-CM | POA: Diagnosis not present

## 2020-05-29 DIAGNOSIS — Z923 Personal history of irradiation: Secondary | ICD-10-CM | POA: Diagnosis not present

## 2020-05-29 DIAGNOSIS — Z170 Estrogen receptor positive status [ER+]: Secondary | ICD-10-CM | POA: Insufficient documentation

## 2020-05-29 DIAGNOSIS — Z79811 Long term (current) use of aromatase inhibitors: Secondary | ICD-10-CM | POA: Diagnosis not present

## 2020-05-29 NOTE — Progress Notes (Signed)
Radiation Oncology Follow up Note  Name: Leah [name]   Date:   05/29/2020 MRN:  [id number] DOB: 1965-10-23    This 55 y.o. 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  Based on her recurrence risk she did receive dose dense AC plus Taxol.  She is seen today in routine follow-up is doing well she had an Zometa infusion last week and is somewhat fatigued from that.  She specifically denies breast tenderness cough or bone pain..  She is currently on Arimidex tolerating it well without side effect.  She is not yet had a follow-up mammogram  COMPLICATIONS OF TREATMENT: none  FOLLOW UP COMPLIANCE: keeps appointments   PHYSICAL EXAM:  BP (!) 128/98   Pulse (!) 118   Temp (!) 96.5 F (35.8 C) (Tympanic)   Wt 167 lb 14.4 oz (76.2 kg)   BMI 29.74 kg/m  Lungs are clear to A&P cardiac examination essentially unremarkable with regular rate and rhythm. No dominant mass or nodularity is noted in either breast in 2 positions examined. Incision is well-healed. No axillary or supraclavicular adenopathy is appreciated. Cosmetic result is excellent.  Well-developed well-nourished patient in NAD. HEENT reveals PERLA, EOMI, discs not visualized.  Oral cavity is clear. No oral mucosal lesions are identified. Neck is clear without evidence of cervical or supraclavicular adenopathy. Lungs are clear to A&P. Cardiac examination is essentially unremarkable with regular rate and rhythm without murmur rub or thrill. Abdomen is benign with no organomegaly or masses noted. Motor sensory and DTR levels are equal and symmetric in the upper and lower  extremities. Cranial nerves II through XII are grossly intact. Proprioception is intact. No peripheral adenopathy or edema is identified. No motor or sensory levels are noted. Crude visual fields are within normal range.  RADIOLOGY RESULTS: No current films to review  PLAN: Present time patient is doing well 6 months out from whole breast radiation.  She continues on Arimidex without side effect.  She is receiving Zometa.  She continues close follow-up care with medical oncology.  I have asked to see her out in 6 months for follow-up and then will start once year follow-up visits.  Patient knows to call with any concerns.  I would like to take this opportunity to thank you for allowing me to participate in the care of your patient..Noreene Filbert MD

## 2020-06-01 IMAGING — MG MM DIGITAL DIAGNOSTIC BILAT W/ TOMO W/ CAD
8 series · 8 of 24 positions shown · non-contrast
Comparison: Previous exam(s).

CLINICAL DATA: Follow-up for probably benign left breast mass.
Patient reports a strong family history of breast cancer including
in her mother diagnosed in mid 50s as well as maternal grandmother
diagnosed image 50s.

EXAM:
DIGITAL DIAGNOSTIC BILATERAL MAMMOGRAM WITH CAD AND TOMO
LEFT BREAST ULTRASOUND

[L MLO synth-2D]
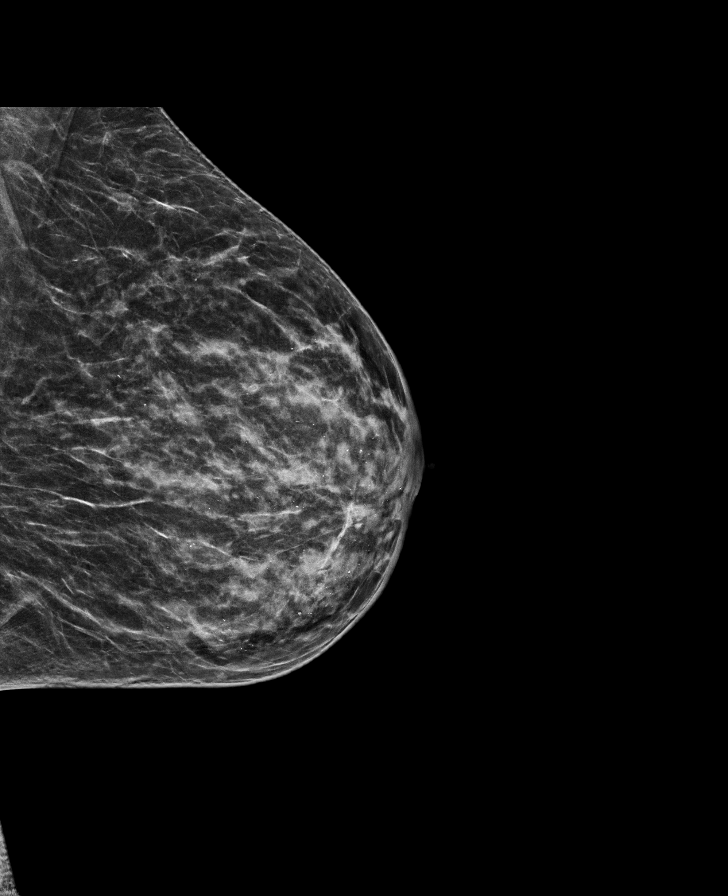

[R CC synth-2D]
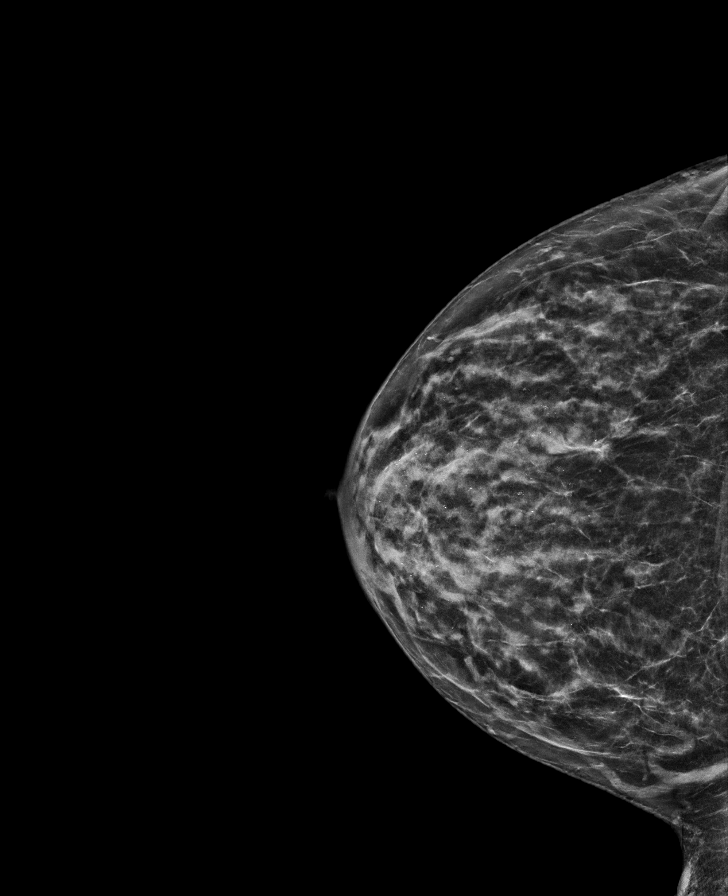

[R MLO synth-2D]
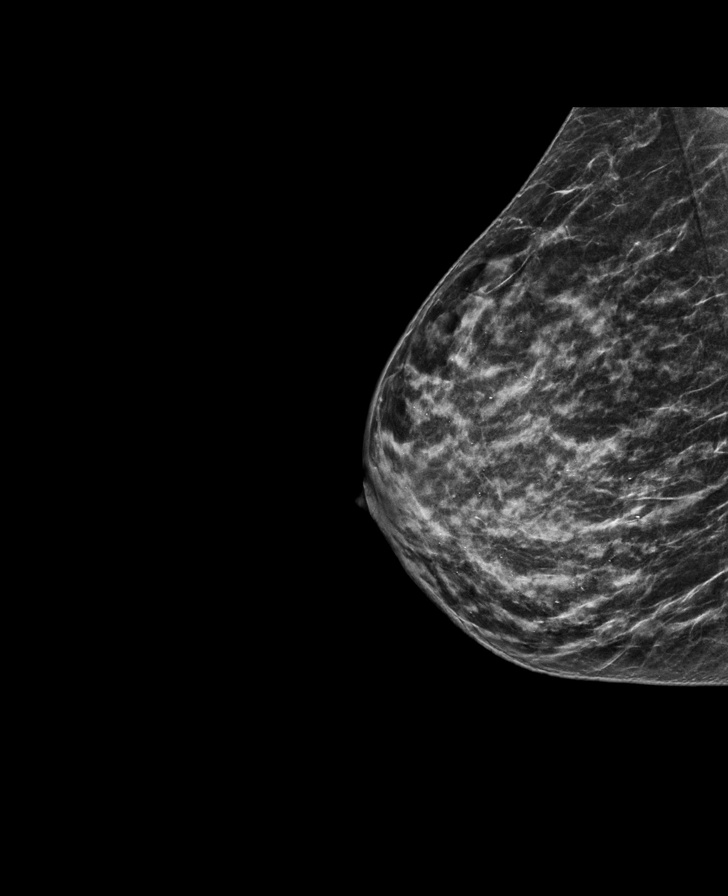

[L CC synth-2D]
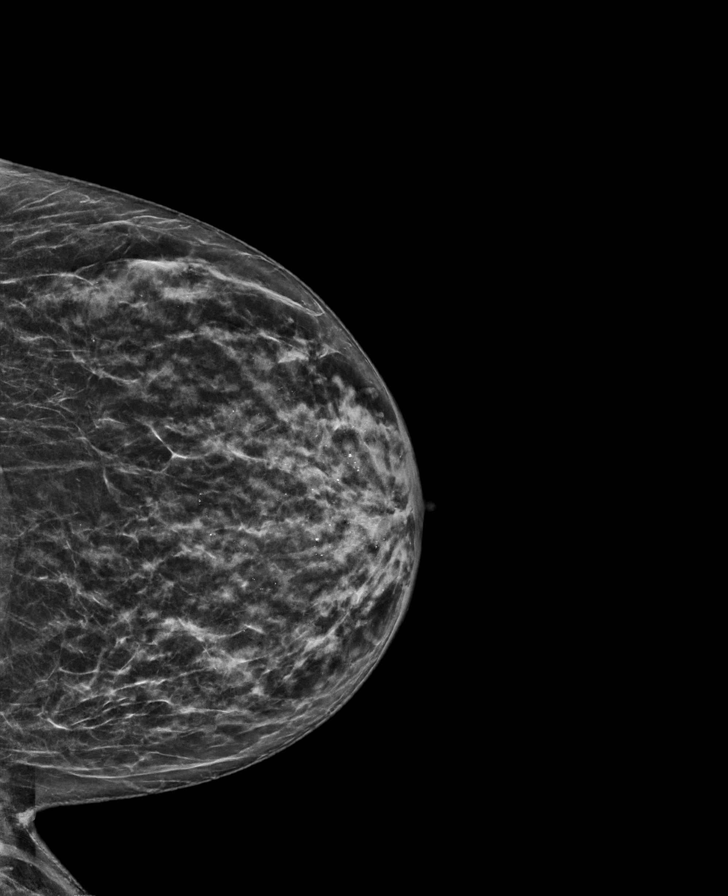

[R MLO tomo · tomo slice 25/50.0]
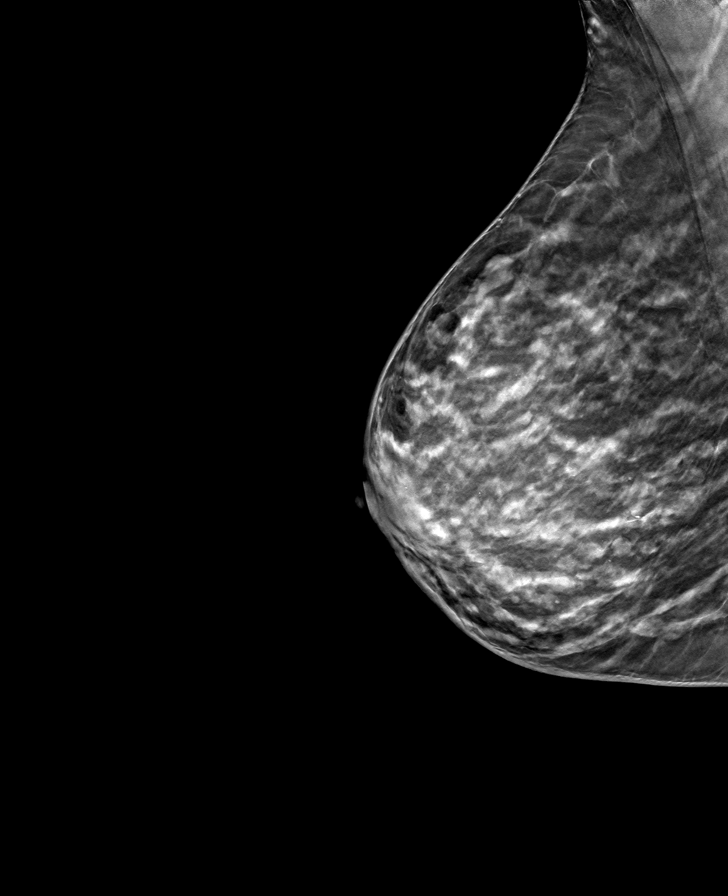

[L MLO tomo · tomo slice 28/55.0]
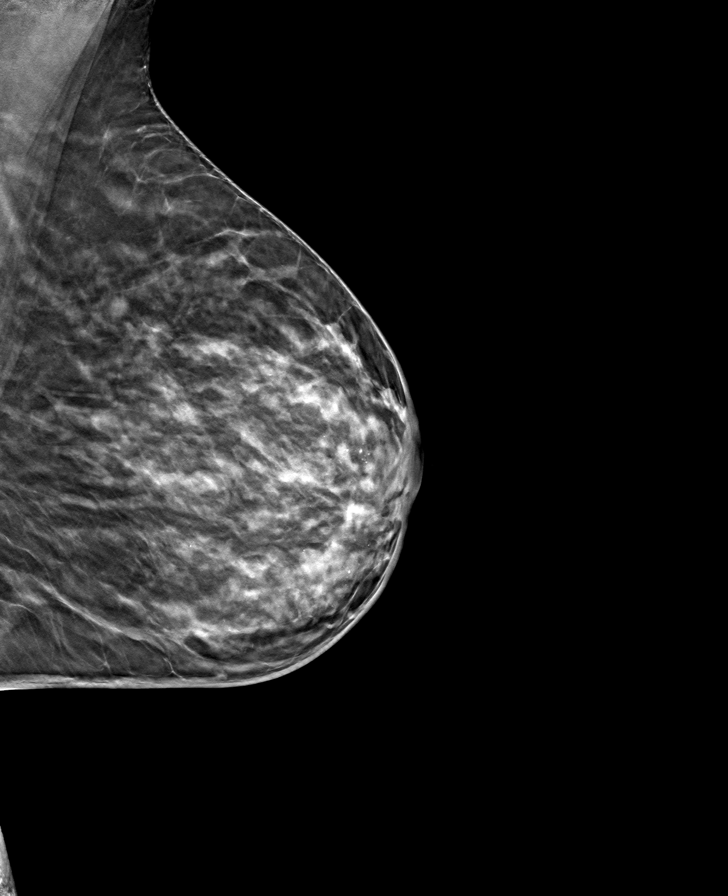

[R CC tomo · tomo slice 30/59.0]
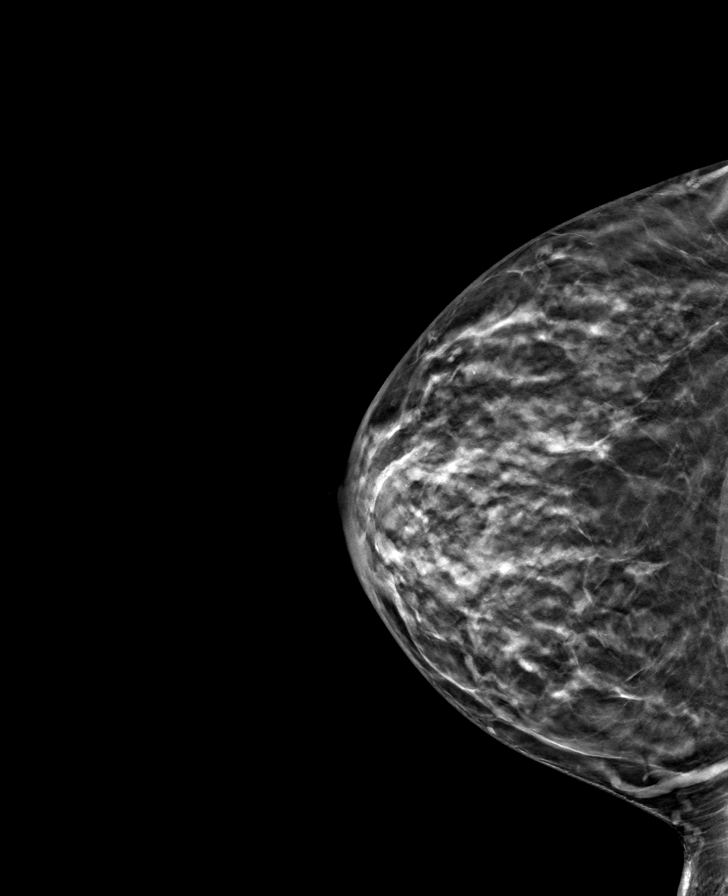

[L CC tomo · tomo slice 31/60.0]
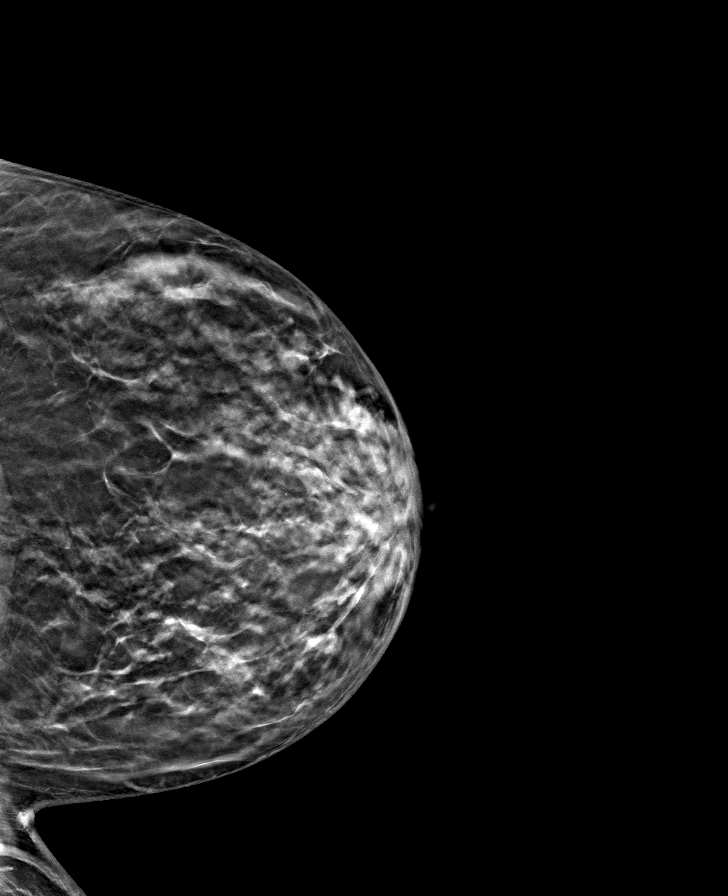

[8 of 24 positions shown; findings below may reference images not displayed]

ACR Breast Density Category c: The breast tissue is heterogeneously
dense, which may obscure small masses.
FINDINGS: No suspicious masses or calcifications are seen in either breast.
The previously seen oval mass in the outer left breast appears
smaller to nearly resolved on today's images.

Mammographic images were processed with CAD.

Targeted ultrasound of the outer left breast was performed
demonstrating a cyst at the 3-3:30 position 6 cm from nipple
measuring 0.6 x 0.4 x 0.5 cm, smaller in size when compared to the
prior exam and considered benign.
IMPRESSION: No findings of malignancy in either breast.

RECOMMENDATION:
1.  Screening mammogram in one year.(Code:II-6-3P4)

2. Patient reports a strong family history of breast cancer
including in her mother diagnosed with breast cancer in her mid 50s
as well as grandmother diagnosed mid 50s. According to the
[place] breast Cancer Risk evaluation tool this patient's
lifetime risk of breast cancer is calculated to be 29.1%. The
American Cancer Society recommends annual MRI and mammography in
patients with an estimated lifetime risk of developing breast cancer
greater than 20 - 25%, or who are known or suspected to be positive
for the breast cancer gene.

I have discussed the findings and recommendations with the patient.
Results were also provided in writing at the conclusion of the
visit. If applicable, a reminder letter will be sent to the patient
regarding the next appointment.

BI-RADS CATEGORY  2: Benign.

## 2020-06-13 ENCOUNTER — Encounter: Payer: Self-pay | Admitting: Oncology

## 2020-06-13 ENCOUNTER — Other Ambulatory Visit: Payer: Self-pay | Admitting: Oncology

## 2020-06-18 ENCOUNTER — Other Ambulatory Visit: Payer: Self-pay | Admitting: *Deleted

## 2020-06-18 DIAGNOSIS — R413 Other amnesia: Secondary | ICD-10-CM

## 2020-06-18 DIAGNOSIS — Z170 Estrogen receptor positive status [ER+]: Secondary | ICD-10-CM

## 2020-06-18 DIAGNOSIS — C50212 Malignant neoplasm of upper-inner quadrant of left female breast: Secondary | ICD-10-CM

## 2020-06-30 ENCOUNTER — Ambulatory Visit (INDEPENDENT_AMBULATORY_CARE_PROVIDER_SITE_OTHER): Payer: Medicaid Other | Admitting: Psychology

## 2020-06-30 DIAGNOSIS — F418 Other specified anxiety disorders: Secondary | ICD-10-CM | POA: Diagnosis not present

## 2020-07-03 ENCOUNTER — Inpatient Hospital Stay: Payer: Medicaid Other | Attending: Internal Medicine | Admitting: Internal Medicine

## 2020-07-03 ENCOUNTER — Encounter: Payer: Self-pay | Admitting: Internal Medicine

## 2020-07-03 DIAGNOSIS — Z923 Personal history of irradiation: Secondary | ICD-10-CM | POA: Insufficient documentation

## 2020-07-03 DIAGNOSIS — Z803 Family history of malignant neoplasm of breast: Secondary | ICD-10-CM | POA: Insufficient documentation

## 2020-07-03 DIAGNOSIS — Z9221 Personal history of antineoplastic chemotherapy: Secondary | ICD-10-CM | POA: Diagnosis not present

## 2020-07-03 DIAGNOSIS — R4189 Other symptoms and signs involving cognitive functions and awareness: Secondary | ICD-10-CM | POA: Insufficient documentation

## 2020-07-03 DIAGNOSIS — C50212 Malignant neoplasm of upper-inner quadrant of left female breast: Secondary | ICD-10-CM | POA: Insufficient documentation

## 2020-07-03 DIAGNOSIS — F419 Anxiety disorder, unspecified: Secondary | ICD-10-CM | POA: Diagnosis not present

## 2020-07-03 DIAGNOSIS — Z170 Estrogen receptor positive status [ER+]: Secondary | ICD-10-CM | POA: Diagnosis not present

## 2020-07-03 DIAGNOSIS — Z8042 Family history of malignant neoplasm of prostate: Secondary | ICD-10-CM | POA: Insufficient documentation

## 2020-07-03 DIAGNOSIS — Z79899 Other long term (current) drug therapy: Secondary | ICD-10-CM | POA: Diagnosis not present

## 2020-07-03 DIAGNOSIS — Z79811 Long term (current) use of aromatase inhibitors: Secondary | ICD-10-CM | POA: Diagnosis not present

## 2020-07-03 DIAGNOSIS — Z8719 Personal history of other diseases of the digestive system: Secondary | ICD-10-CM | POA: Diagnosis not present

## 2020-07-03 DIAGNOSIS — Z87891 Personal history of nicotine dependence: Secondary | ICD-10-CM | POA: Insufficient documentation

## 2020-07-03 NOTE — Progress Notes (Signed)
Patient states she Is having cognitive trouble. She states she does not catch on fast enough. Patient was let go from her job due not being able to catch on to things fast enough like she was able to before.

## 2020-07-03 NOTE — Progress Notes (Signed)
[facility] [address], [place] [address] [phone]   Cognitive Survivorship Evaluation  Date of Service: 07/03/20 Patient Name: Leah [name] Patient MRN: [id number] Patient DOB: 1965/05/20 Provider: Ventura Sellers, MD  Identifying Statement:  Leah [name] is a 55 y.o. female who presents for initial consultation and evaluation regarding cancer associated cognitive decline.    Referring Provider: Sindy Guadeloupe, MD [address] [place],  [place] [id number]  Primary Cancer:  Oncologic History: Oncology History  Malignant neoplasm of left female breast ([place])  12/28/2018 Initial Diagnosis   Breast cancer ([place])   01/03/2019 Cancer Staging   Staging form: Breast, AJCC 8th Edition - Clinical stage from 01/03/2019: Stage IA (cT1c, cN0, cM0, G2, ER+, PR+, HER2-) - Signed by Sindy Guadeloupe, MD on 01/03/2019   01/24/2019 Cancer Staging   Staging form: Breast, AJCC 8th Edition - Pathologic stage from 01/24/2019: Stage IA (pT1b, pN1a, cM0, G1, ER+, PR+, HER2-) - Signed by Sindy Guadeloupe, MD on 01/25/2019    Genetic Testing   No pathogenic variants identified. VUS in BARD1 called c.1672T>C (p.Ser558Pro) identified on the [facility] Common Hereditary Cancers Panel. The report date is 01/29/2019.  The Common Hereditary Cancers Panel offered by [facility] includes sequencing and/or deletion duplication testing of the following 47 genes: APC, ATM, AXIN2, BARD1, BMPR1A, BRCA1, BRCA2, BRIP1, CDH1, CDKN2A (p14ARF), CDKN2A (p16INK4a), CKD4, CHEK2, CTNNA1, DICER1, EPCAM (Deletion/duplication testing only), GREM1 (promoter region deletion/duplication testing only), KIT, MEN1, MLH1, MSH2, MSH3, MSH6, MUTYH, NBN, NF1, NHTL1, PALB2, PDGFRA, PMS2, POLD1, POLE, PTEN, RAD50, RAD51C, RAD51C, SDHB, SDHC, SDHD, SMAD4, SMARCA4. STK11, TP53, TSC1, TSC2, and VHL.  The following genes were evaluated for sequence changes only: SDHA and HOXB13  c.251G>A variant only.   03/19/2019 - 04/30/2019 Chemotherapy   The patient had dexamethasone (DECADRON) 4 MG tablet, 1 of 1 cycle, Start date: 06/18/2019, End date: 11/25/2019 DOXOrubicin (ADRIAMYCIN) chemo injection 112 mg, 60 mg/m2 = 112 mg, Intravenous,  Once, 4 of 4 cycles Administration: 112 mg (03/19/2019), 112 mg (04/02/2019), 112 mg (04/16/2019), 112 mg (04/30/2019) palonosetron (ALOXI) injection 0.25 mg, 0.25 mg, Intravenous,  Once, 4 of 4 cycles Administration: 0.25 mg (03/19/2019), 0.25 mg (04/02/2019), 0.25 mg (04/16/2019), 0.25 mg (04/30/2019) pegfilgrastim (NEULASTA ONPRO KIT) injection 6 mg, 6 mg, Subcutaneous, Once, 4 of 4 cycles Administration: 6 mg (03/19/2019), 6 mg (04/02/2019), 6 mg (04/16/2019), 6 mg (04/30/2019) cyclophosphamide (CYTOXAN) 1,120 mg in sodium chloride 0.9 % 250 mL chemo infusion, 600 mg/m2 = 1,120 mg, Intravenous,  Once, 4 of 4 cycles Administration: 1,120 mg (03/19/2019), 1,120 mg (04/02/2019), 1,120 mg (04/16/2019), 1,120 mg (04/30/2019) fosaprepitant (EMEND) 150 mg in sodium chloride 0.9 % 145 mL IVPB, 150 mg, Intravenous,  Once, 4 of 4 cycles Administration: 150 mg (03/19/2019), 150 mg (04/02/2019), 150 mg (04/16/2019), 150 mg (04/30/2019)  for chemotherapy treatment.    05/14/2019 -  Chemotherapy   The patient had PACLitaxel (TAXOL) 150 mg in sodium chloride 0.9 % 250 mL chemo infusion (</= 74m/m2), 80 mg/m2 = 150 mg, Intravenous,  Once, 12 of 12 cycles Dose modification: 65 mg/m2 (original dose 80 mg/m2, Cycle 6, Reason: Other (see comments), Comment: neuropathy) Administration: 150 mg (05/14/2019), 150 mg (05/21/2019), 150 mg (05/28/2019), 150 mg (06/04/2019), 150 mg (06/11/2019), 126 mg (06/18/2019), 126 mg (06/25/2019), 126 mg (07/02/2019), 126 mg (07/09/2019), 126 mg (07/16/2019), 126 mg (07/23/2019), 126 mg (08/06/2019)  for chemotherapy treatment.      History of Present Illness: The  patient's records from the referring physician were obtained and reviewed and the patient interviewed to  confirm this HPI.  Leah [name] presents today to discuss cognitive changes since beginning treatment for breast cancer.  She describes modest impairment in attention, processing speed and short term memory.  There is increased anxiety and mood lability as well. 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  Otherwise denies focal complaints, no seizures, headaches.  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  Medications: Current Outpatient Medications on File Prior to Visit  Medication Sig Dispense Refill  . acetaminophen (TYLENOL) 500 MG tablet Take 500-1,000 mg by mouth every 6 (six) hours as needed (for pain.).    Marland Kitchen anastrozole (ARIMIDEX) 1 MG tablet TAKE 1 TABLET BY MOUTH EVERY DAY 90 tablet 2  . atorvastatin (LIPITOR) 10 MG tablet Take 1 tablet (10 mg total) by mouth daily. 90 tablet 3  . Cholecalciferol (VITAMIN D-3) 125 MCG (5000 UT) TABS Take 5,000 Units by mouth daily.    . Cyanocobalamin (VITAMIN B-12 PO) Take 1 tablet by mouth daily. 1037m daily    . gabapentin (NEURONTIN) 300 MG capsule TAKE 1 CAPSULE BY MOUTH THREE TIMES A DAY 90 capsule 2  . meclizine (ANTIVERT) 12.5 MG tablet Take 1 tablet (12.5 mg total) by mouth 3 (three) times daily as needed for dizziness. 90 tablet 1  . meloxicam (MOBIC) 15 MG tablet Take 1 tablet (15 mg total) by mouth daily as needed for pain. 90 tablet 3  . methocarbamol (ROBAXIN) 750 MG tablet Take 1 tablet (750 mg total) by mouth every 6 (six) hours as needed for muscle spasms. 120 tablet 5  . omeprazole (PRILOSEC) 40 MG capsule Take 1 capsule (40 mg total) by mouth every evening. 90 capsule 3  . tiZANidine (ZANAFLEX) 4 MG tablet Take 4 mg by mouth at bedtime.    . traMADol (ULTRAM) 50 MG tablet Take 1-2 tablets (50-100 mg total) by mouth every 6 (six) hours as needed. 20 tablet 0   . traZODone (DESYREL) 50 MG tablet Take 1-1.5 tablets (50-75 mg total) by mouth at bedtime as needed for sleep. 140 tablet 4  . valACYclovir (VALTREX) 1000 MG tablet Take 1 tablet (1,000 mg total) by mouth daily. Suppression. BID X 5 days with outbreak as needed 120 tablet 3  . DULoxetine (CYMBALTA) 60 MG capsule TAKE 1 CAPSULE BY MOUTH EVERY DAY (Patient not taking: No sig reported) 90 capsule 1  . fluticasone (FLONASE) 50 MCG/ACT nasal spray Place 2 sprays into both nostrils daily. Max 2 sprays (Patient not taking: No sig reported) 16 g 12  . [DISCONTINUED] prochlorperazine (COMPAZINE) 10 MG tablet Take 1 tablet (10 mg total) by mouth every 6 (six) hours as needed (Nausea or vomiting). (Patient not taking: Reported on 05/13/2019) 30 tablet 1   No current facility-administered medications on file prior to visit.    Allergies:  Allergies  Allergen Reactions  . Hydrocodone-Acetaminophen Hives    All over her body.  . Lactose Intolerance (Gi) Other (See Comments)    Bloating and GI upset  . Penicillins Rash    Did it involve swelling of the face/tongue/throat, SOB, or low BP? Unknown Did it involve sudden or severe rash/hives, skin peeling, or any reaction on the inside of your mouth or nose? Yes Did you need to seek medical attention  at a hospital or doctor's office? Unknown When did it last happen? Childhood reaction. If all above answers are "NO", may proceed with cephalosporin use.   . Sulfa Antibiotics Rash    Full body rash    Past Medical History:  Past Medical History:  Diagnosis Date  . Anxiety   . Asthma    as a child  . Breast cancer ([place])   . Carpal tunnel syndrome    R hand   . Colon polyps   . Depression   . Eosinophilic esophagitis    noted in [place] with GI path report 09/29/14   . Esophageal stricture    s/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 Edwin Cap   . Family history of breast cancer   . Family history of prostate cancer   .  Herniated disc, cervical    s/p epidural injection  . Herpes   . History of kidney stones   . Hyperlipidemia   . Low back pain   . Migraines   . Personal history of chemotherapy   . Personal history of radiation therapy    Past Surgical History:  Past Surgical History:  Procedure Laterality Date  . BREAST BIOPSY Left 12/25/2018   [place] BX, invasive mammary carcinoma   . BREAST LUMPECTOMY    . BREAST LUMPECTOMY WITH NEEDLE LOCALIZATION AND AXILLARY SENTINEL LYMPH NODE BX Left 01/16/2019   Procedure: LEFT BREAST LUMPECTOMY WITH NEEDLE LOCALIZATION AND SENTINEL LYMPH NODE BIOPSY;  Surgeon: Jovita Kussmaul, MD;  Location: [facility] ORS;  Service: General;  Laterality: Left;  . CESAREAN SECTION    . COLONOSCOPY WITH PROPOFOL N/A 03/27/2017   Procedure: COLONOSCOPY WITH PROPOFOL;  Surgeon: Jonathon Bellows, MD;  Location: [facility] ENDOSCOPY;  Service: Gastroenterology;  Laterality: N/A;  . ESOPHAGOGASTRODUODENOSCOPY    . ESOPHAGOGASTRODUODENOSCOPY (EGD) WITH PROPOFOL N/A 03/27/2017   Procedure: ESOPHAGOGASTRODUODENOSCOPY (EGD) WITH PROPOFOL WITH DILATION;  Surgeon: Jonathon Bellows, MD;  Location: [facility] ENDOSCOPY;  Service: Gastroenterology;  Laterality: N/A;  . PORTACATH PLACEMENT N/A 02/15/2019   Procedure: INSERTION PORT-A-CATH WITH POSSIBLE ULTRASOUND;  Surgeon: Jovita Kussmaul, MD;  Location: [facility] ORS;  Service: General;  Laterality: N/A;  . RE-EXCISION OF BREAST CANCER,SUPERIOR MARGINS Left 02/15/2019   Procedure: RE-EXCISION OF LEFT BREAST CANCER ANTERIOR MARGINS;  Surgeon: Jovita Kussmaul, MD;  Location: [facility] ORS;  Service: General;  Laterality: Left;  . THROAT SURGERY  2015  . UMBILICAL HERNIA REPAIR  2006    x 2   w mesh per pt   Social History:  Social History   Socioeconomic History  . Marital status: Single    Spouse name: Not on file  . Number of children: Not on file  . Years of education: Not on file  . Highest education level: Not on file  Occupational History  . Not on file  Tobacco Use  .  Smoking status: Former Smoker    Packs/day: 1.00    Years: 20.00    Pack years: 20.00    Quit date: 06/02/2006    Years since quitting: 14.0  . Smokeless tobacco: Never Used  Vaping Use  . Vaping Use: Never used  Substance and Sexual Activity  . Alcohol use: Not Currently  . Drug use: No  . Sexual activity: Yes    Partners: Male  Other Topics Concern  . 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 As of 02/26/17    Divorced.    Former smoker 20+ years quit in 1997 then started again for 5 years and quit 10-12 years ago as of 03/08/17    Social Determinants of Radio broadcast assistant Strain: Not on file  Food Insecurity: Not on file  Transportation Needs: Not on file  Physical Activity: Not on file  Stress: Not on file  Social Connections: Not on file  Intimate Partner Violence: Not on file   Family History:  Family History  Problem Relation Age of Onset  . Breast cancer Mother        dx late 66s  . Thyroid disease Mother   . Colon polyps Mother   . Prostate cancer Father        dx 89  . Breast cancer Maternal Grandmother        dx late 17s  . Cancer Maternal Uncle        possibly, unsure type    Review of Systems: Constitutional: Doesn't report fevers, chills or abnormal weight loss Eyes: Doesn't report blurriness of vision Ears, nose, mouth, throat, and face: Doesn't report sore throat Respiratory: Doesn't report cough, dyspnea or wheezes Cardiovascular: Doesn't report palpitation, chest discomfort  Gastrointestinal:  Doesn't report nausea, constipation, diarrhea GU: Doesn't report incontinence Skin: Doesn't report skin rashes Neurological: Per HPI Musculoskeletal: Doesn't report joint pain Behavioral/Psych: +anxiety  Physical Exam: Vitals:   07/03/20 0905  BP: 134/79  Pulse: 72  Resp: 20  Temp: 99 F (37.2 C)  SpO2: 100%    General: Alert, cooperative, pleasant, in no acute distress Head: Normal EENT: No conjunctival injection or scleral icterus.  Lungs: Resp effort normal Cardiac: Regular rate Abdomen: Non-distended abdomen Skin: No rashes cyanosis or petechiae. Extremities: No clubbing or edema  Neurologic Exam: Mental Status: Awake, alert, attentive to examiner. Oriented to self and environment. Language is fluent with intact comprehension.  Cranial Nerves: Visual acuity is grossly normal. Visual fields are full. Extra-ocular movements intact. No ptosis. Face is symmetric Motor: Tone and bulk are normal. Power is full in both arms and legs.  Sensory: Intact to light touch Gait: Normal.   Labs: I have reviewed the data as listed    Component Value Date/Time   NA 140 05/22/2020 1100   K 3.7 05/22/2020 1100   CL 105 05/22/2020 1100   CO2 26 05/22/2020 1100   GLUCOSE 106 (H) 05/22/2020 1100   BUN 10 05/22/2020 1100   CREATININE 0.75 05/22/2020 1100   CREATININE 0.66 10/03/2017 0837   CALCIUM 9.3 05/22/2020 1100   PROT 6.8 05/22/2020 1100   ALBUMIN 3.8 05/22/2020 1100   AST 19 05/22/2020 1100   ALT 18 05/22/2020 1100   ALKPHOS 60 05/22/2020 1100   BILITOT 0.7 05/22/2020 1100   GFRNONAA >60 05/22/2020 1100   GFRNONAA 104 10/03/2017 0837   GFRAA >60 11/25/2019 1312   GFRAA 120 10/03/2017 0837   Lab Results  Component Value Date   WBC 6.5 05/22/2020   NEUTROABS 4.2 05/22/2020   HGB 14.3 05/22/2020   HCT 39.9 05/22/2020   MCV 91.1 05/22/2020   PLT 293 05/22/2020     Assessment/Plan:  Cognitive Changes  Leah [name] presents with clinical syndrome 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  She does not meet criteria for dementia or mild cognitive impairment.   We provided counseling regarding healthy behaviors to maintain cognitive  function, including exercise, diet, and positive outlook.  We discussed mindful  relaxation and provided some methods to redirect anxiety without medication.   No further CNS workup or cognitive screening recommended at this time.  We spent twenty additional minutes teaching regarding the natural history, biology, and historical experience in the treatment of neurologic complications of cancer.   We appreciate the opportunity to participate in the care of Leah [name].  We encouraged follow up for progressive cognitive issues or neurologic symptoms if they develop.  All questions were answered. The patient knows to call the clinic with any problems, questions or concerns. No barriers to learning were detected.  The total time spent in the encounter was 40 minutes and more than 50% was on counseling and review of test results   Ventura Sellers, MD Medical Director of Neuro-Oncology [facility] at [place] 07/03/20 10:10 AM

## 2020-07-21 DIAGNOSIS — M5030 Other cervical disc degeneration, unspecified cervical region: Secondary | ICD-10-CM | POA: Diagnosis not present

## 2020-07-21 DIAGNOSIS — M5412 Radiculopathy, cervical region: Secondary | ICD-10-CM | POA: Diagnosis not present

## 2020-07-21 DIAGNOSIS — Z79899 Other long term (current) drug therapy: Secondary | ICD-10-CM | POA: Diagnosis not present

## 2020-07-22 ENCOUNTER — Ambulatory Visit (INDEPENDENT_AMBULATORY_CARE_PROVIDER_SITE_OTHER): Payer: Medicaid Other | Admitting: Psychology

## 2020-07-22 DIAGNOSIS — F418 Other specified anxiety disorders: Secondary | ICD-10-CM | POA: Diagnosis not present

## 2020-07-23 ENCOUNTER — Other Ambulatory Visit: Payer: Self-pay

## 2020-07-23 DIAGNOSIS — Z170 Estrogen receptor positive status [ER+]: Secondary | ICD-10-CM

## 2020-07-23 DIAGNOSIS — C50212 Malignant neoplasm of upper-inner quadrant of left female breast: Secondary | ICD-10-CM

## 2020-07-31 ENCOUNTER — Encounter: Payer: Self-pay | Admitting: *Deleted

## 2020-08-10 ENCOUNTER — Ambulatory Visit (INDEPENDENT_AMBULATORY_CARE_PROVIDER_SITE_OTHER): Payer: Medicaid Other | Admitting: Psychology

## 2020-08-10 DIAGNOSIS — F418 Other specified anxiety disorders: Secondary | ICD-10-CM

## 2020-08-16 ENCOUNTER — Other Ambulatory Visit: Payer: Self-pay | Admitting: Oncology

## 2020-08-17 ENCOUNTER — Encounter: Payer: Self-pay | Admitting: Oncology

## 2020-08-19 DIAGNOSIS — M4802 Spinal stenosis, cervical region: Secondary | ICD-10-CM | POA: Diagnosis not present

## 2020-08-19 DIAGNOSIS — M5030 Other cervical disc degeneration, unspecified cervical region: Secondary | ICD-10-CM | POA: Diagnosis not present

## 2020-08-19 DIAGNOSIS — M5412 Radiculopathy, cervical region: Secondary | ICD-10-CM | POA: Diagnosis not present

## 2020-08-26 ENCOUNTER — Inpatient Hospital Stay: Payer: Medicaid Other | Attending: Oncology | Admitting: Occupational Therapy

## 2020-08-26 DIAGNOSIS — M25551 Pain in right hip: Secondary | ICD-10-CM

## 2020-08-26 DIAGNOSIS — M6281 Muscle weakness (generalized): Secondary | ICD-10-CM

## 2020-08-26 DIAGNOSIS — M25552 Pain in left hip: Secondary | ICD-10-CM

## 2020-08-26 NOTE — Therapy (Signed)
[facility] [address] [place], [place], [place], [address] Phone: [phone]   Fax:  [phone]  Occupational Therapy Screen:  Patient Details  Name: Leah [name] MRN: [id number] Date of Birth: Jul 21, 1965 No data recorded  Encounter Date: 08/26/2020   OT End of Session - 08/26/20 1244     Visit Number 0             Past Medical History:  Diagnosis Date   Anxiety    Asthma    as a child   Breast cancer ([place])    Carpal tunnel syndrome    R hand    Colon polyps    Depression    Eosinophilic esophagitis    noted in [place] with GI path report 09/29/14    Esophageal stricture    s/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 Edwin Cap    Family history of breast cancer    Family history of prostate cancer    Herniated disc, cervical    s/p epidural injection   Herpes    History of kidney stones    Hyperlipidemia    Low back pain    Migraines    Personal history of chemotherapy    Personal history of radiation therapy     Past Surgical History:  Procedure Laterality Date   BREAST BIOPSY Left 12/25/2018   [place] BX, invasive mammary carcinoma    BREAST LUMPECTOMY     BREAST LUMPECTOMY WITH NEEDLE LOCALIZATION AND AXILLARY SENTINEL LYMPH NODE BX Left 01/16/2019   Procedure: LEFT BREAST LUMPECTOMY WITH NEEDLE LOCALIZATION AND SENTINEL LYMPH NODE BIOPSY;  Surgeon: Jovita Kussmaul, MD;  Location: [facility] ORS;  Service: General;  Laterality: Left;   CESAREAN SECTION     COLONOSCOPY WITH PROPOFOL N/A 03/27/2017   Procedure: COLONOSCOPY WITH PROPOFOL;  Surgeon: Jonathon Bellows, MD;  Location: [facility] ENDOSCOPY;  Service: Gastroenterology;  Laterality: N/A;   ESOPHAGOGASTRODUODENOSCOPY     ESOPHAGOGASTRODUODENOSCOPY (EGD) WITH PROPOFOL N/A 03/27/2017   Procedure: ESOPHAGOGASTRODUODENOSCOPY (EGD) WITH PROPOFOL WITH DILATION;  Surgeon: Jonathon Bellows, MD;  Location: [facility] ENDOSCOPY;  Service: Gastroenterology;   Laterality: N/A;   PORTACATH PLACEMENT N/A 02/15/2019   Procedure: INSERTION PORT-A-CATH WITH POSSIBLE ULTRASOUND;  Surgeon: Jovita Kussmaul, MD;  Location: [facility] ORS;  Service: General;  Laterality: N/A;   RE-EXCISION OF BREAST CANCER,SUPERIOR MARGINS Left 02/15/2019   Procedure: RE-EXCISION OF LEFT BREAST CANCER ANTERIOR MARGINS;  Surgeon: Jovita Kussmaul, MD;  Location: [facility] ORS;  Service: General;  Laterality: Left;   THROAT SURGERY  7356   UMBILICAL HERNIA REPAIR  2006    x 2   w mesh per pt    There were no vitals filed for this visit.   Subjective Assessment - 08/26/20 1242     Subjective  I don't have any issues in my L breast or arm - doing very well but my hips hurts so bad ,and at times my legs and R knee - don't know if it is the medication - I started walking my dogs and also just walking for exercise    Pain Score 8     Pain Orientation Left;Right    Pain Descriptors / Indicators Aching    Pain Type Chronic pain    Pain Onset More than a month ago    Aggravating Factors  worse with walking or exerise              DR RAO NOTE 05/22/20:  Assessment and plan- Patient is a 55 y.o. 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  She is s/p lumpectomy with high risk MammaPrint score.  She is s/p 4 cycles of dose dense AC and 12 cycles of weekly Taxol chemotherapy.   She is here for routine follow-up of breast cancer   Clinically patient is doing well with no concern in signs and symptoms of recurrence based on today's exam.  She will continue to take Arimidex.  She was due for Zometa today.  Calcium levels are normal and acceptable to proceed with Zometa today I will see her back in 6 months with CBC with differential CMP for next dose of Zometa.  Her mammogram would be due in September 2022 which would be ordered by Dr. Marlou Starks.   OT SCREEN TODAY  08/26/20:   Pt refer to OT by Cancer Transition team - pt report no issues with lymphedema or L UE AROM and strength. But with increase pain the last few months in bilateral hips, legs ache and R knee pain. Pain in hips about 8/10. Not working at the moment. Pt did get dogs and walking more the last few months but also trying to exercise and walk more. She has been on Arimidex and were wondering if it cause the pain. OT contacted Sherri - DR Rao's nurse to follow up with pt. In the meantime - pt do show decrease hip ABD, internal and ext rotation. Pt to switch her walking to the pool and do ABD and ext of hips is want -but tap with feet.  Will follow up in 2 wks with me again                                     Visit Diagnosis: Muscle weakness (generalized)  Pain in left hip  Pain in right hip    Problem List Patient Active Problem List   Diagnosis Date Noted   Cognitive changes 07/03/2020   Annual physical exam 03/19/2020   B12 deficiency 07/19/2019   COVID-19 virus infection 03/13/2019   BMI 29.0-29.9,adult 03/03/2019   Genetic testing 01/29/2019   Family history of breast cancer    Family history of prostate cancer    Goals of care, counseling/discussion 01/03/2019   Malignant neoplasm of left female breast ([place]) 12/28/2018   Abnormal MRI of head 12/11/2018   Dizziness 12/11/2018   Chronic pain 12/11/2018   Vitamin D deficiency 12/11/2018   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 08/21/2018   Insomnia 03/23/2018   Allergic rhinitis 01/03/2018   Anxiety 01/03/2018   Cervical radiculopathy 10/03/2017   Tinea corporis 10/03/2017   Hyperlipidemia 04/04/2017   Breast cyst, left 04/04/2017   Aortic atherosclerosis ([place]) 04/04/2017   Anxiety and depression 03/08/2017   Dysphagia 03/08/2017   Hip pain-right 03/08/2017   Low back pain 03/08/2017   Flank pain 10/18/2016   Asthma 06/01/2016    Rosalyn Gess OTR/L,CLT 08/26/2020, 12:45  PM  [facility] [address], [place], [place], [address] Phone: [phone]   Fax:  [phone]  Name: Leah [name] MRN: [id number] Date of Birth: 1965/09/21

## 2020-09-02 DIAGNOSIS — R1031 Right lower quadrant pain: Secondary | ICD-10-CM | POA: Diagnosis not present

## 2020-09-02 DIAGNOSIS — R1030 Lower abdominal pain, unspecified: Secondary | ICD-10-CM | POA: Diagnosis not present

## 2020-09-02 DIAGNOSIS — M5030 Other cervical disc degeneration, unspecified cervical region: Secondary | ICD-10-CM | POA: Diagnosis not present

## 2020-09-02 DIAGNOSIS — M25751 Osteophyte, right hip: Secondary | ICD-10-CM | POA: Diagnosis not present

## 2020-09-02 DIAGNOSIS — M160 Bilateral primary osteoarthritis of hip: Secondary | ICD-10-CM | POA: Diagnosis not present

## 2020-09-02 DIAGNOSIS — Z79899 Other long term (current) drug therapy: Secondary | ICD-10-CM | POA: Diagnosis not present

## 2020-09-02 DIAGNOSIS — R1032 Left lower quadrant pain: Secondary | ICD-10-CM | POA: Diagnosis not present

## 2020-09-02 DIAGNOSIS — M5412 Radiculopathy, cervical region: Secondary | ICD-10-CM | POA: Diagnosis not present

## 2020-09-03 ENCOUNTER — Ambulatory Visit (INDEPENDENT_AMBULATORY_CARE_PROVIDER_SITE_OTHER): Payer: Medicaid Other | Admitting: Psychology

## 2020-09-03 ENCOUNTER — Telehealth: Payer: Self-pay | Admitting: *Deleted

## 2020-09-03 DIAGNOSIS — F418 Other specified anxiety disorders: Secondary | ICD-10-CM | POA: Diagnosis not present

## 2020-09-03 NOTE — Telephone Encounter (Signed)
I called patient about the results of Gwenette Greet who is OT person and she was assessing the pt. Due to her hips and knees achy. Gwenette Greet did not find any problems with ROM and strength. The patient and Gwenette Greet both thought maybe the aches and pains are from her AI. I spoke to Janese Banks and she suggested that pt stop the Arimidex for 4 weeks and then call our office back and let us know how she is going. If she is better then we know it is probably the med. She has already tried Letrozole so if the AI is the problem then she can chane to exemetane. Also she went for visit with Dr. Margette Fast with psychiatry for her cervical radiculitis. She also said that she had hip xrays shows the start of spurs but was told that would not the reason for pain in hips. Also she states that it has been 7 months since she last had sex. She tried it last week and it was very painful. I told her that most people can get dry so lubrication is usually helpful. She states that when he went in it was torture and lots of pain. I told her that I would check with Webb Silversmith and Vita Barley about this concern

## 2020-09-09 ENCOUNTER — Other Ambulatory Visit: Payer: Self-pay | Admitting: Oncology

## 2020-09-09 ENCOUNTER — Inpatient Hospital Stay: Payer: Medicaid Other | Admitting: Occupational Therapy

## 2020-09-09 DIAGNOSIS — M25552 Pain in left hip: Secondary | ICD-10-CM

## 2020-09-09 DIAGNOSIS — M25551 Pain in right hip: Secondary | ICD-10-CM

## 2020-09-09 NOTE — Therapy (Signed)
[facility] [address] [place], [place], [place], [address] Phone: [phone]   Fax:  [phone]  Occupational Therapy Screen:  Patient Details  Name: Leah [name] MRN: [id number] Date of Birth: Jun 15, 1965 No data recorded  Encounter Date: 09/09/2020   OT End of Session - 09/09/20 1112     Visit Number 0             Past Medical History:  Diagnosis Date   Anxiety    Asthma    as a child   Breast cancer ([place])    Carpal tunnel syndrome    R hand    Colon polyps    Depression    Eosinophilic esophagitis    noted in [place] with GI path report 09/29/14    Esophageal stricture    s/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 Edwin Cap    Family history of breast cancer    Family history of prostate cancer    Herniated disc, cervical    s/p epidural injection   Herpes    History of kidney stones    Hyperlipidemia    Low back pain    Migraines    Personal history of chemotherapy    Personal history of radiation therapy     Past Surgical History:  Procedure Laterality Date   BREAST BIOPSY Left 12/25/2018   [place] BX, invasive mammary carcinoma    BREAST LUMPECTOMY     BREAST LUMPECTOMY WITH NEEDLE LOCALIZATION AND AXILLARY SENTINEL LYMPH NODE BX Left 01/16/2019   Procedure: LEFT BREAST LUMPECTOMY WITH NEEDLE LOCALIZATION AND SENTINEL LYMPH NODE BIOPSY;  Surgeon: Jovita Kussmaul, MD;  Location: [facility] ORS;  Service: General;  Laterality: Left;   CESAREAN SECTION     COLONOSCOPY WITH PROPOFOL N/A 03/27/2017   Procedure: COLONOSCOPY WITH PROPOFOL;  Surgeon: Jonathon Bellows, MD;  Location: [facility] ENDOSCOPY;  Service: Gastroenterology;  Laterality: N/A;   ESOPHAGOGASTRODUODENOSCOPY     ESOPHAGOGASTRODUODENOSCOPY (EGD) WITH PROPOFOL N/A 03/27/2017   Procedure: ESOPHAGOGASTRODUODENOSCOPY (EGD) WITH PROPOFOL WITH DILATION;  Surgeon: Jonathon Bellows, MD;  Location[facility] ENDOSCOPY;  Service: Gastroenterology;   Laterality: N/A;   PORTACATH PLACEMENT N/A 02/15/2019   Procedure: INSERTION PORT-A-CATH WITH POSSIBLE ULTRASOUND;  Surgeon: Jovita Kussmaul, MD;  Location: [facility] ORS;  Service: General;  Laterality: N/A;   RE-EXCISION OF BREAST CANCER,SUPERIOR MARGINS Left 02/15/2019   Procedure: RE-EXCISION OF LEFT BREAST CANCER ANTERIOR MARGINS;  Surgeon: Jovita Kussmaul, MD;  Location: [facility] ORS;  Service: General;  Laterality: Left;   THROAT SURGERY  0539   UMBILICAL HERNIA REPAIR  2006    x 2   w mesh per pt    There were no vitals filed for this visit.   Subjective Assessment - 09/09/20 1110     Subjective  I done some exercises in the pool and feels better in the water - but my hips stilll hurting - have been a week off my medication - Dr Janese Banks told me to stop it for month    Currently in Pain? Yes    Pain Score 7     Pain Location Hip    Pain Orientation Right;Left    Pain Descriptors / Indicators Aching    Pain Type Chronic pain                DR Janese Banks last visit Assessment and plan- Patient is a 55 y.o. female with prior history of polycythemia now with  invasive mammary  carcinoma pathologic prognostic stage Ia of the left breast pT1b pN1 acM0 ER/PR positive HER-2/neu negative.  She is s/p lumpectomy with high risk MammaPrint score.  She is s/p 4 cycles of dose dense AC and 12 cycles of weekly Taxol chemotherapy.   She is here for routine follow-up of breast cancer   Clinically patient is doing well with no concern in signs and symptoms of recurrence based on today's exam.  She will continue to take Arimidex.  She was due for Zometa today.  Calcium levels are normal and acceptable to proceed with Zometa today I will see her back in 6 months with CBC with differential CMP for next dose of Zometa.  Her mammogram would be due in September 2022 which would be ordered by Dr. Marlou Starks.     OT SCREEN TODAY 08/26/20:   Pt refer to OT by Cancer Transition team - pt report no issues with lymphedema or L UE  AROM and strength. But with increase pain the last few months in bilateral hips, legs ache and R knee pain. Pain in hips about 8/10. Not working at the moment. Pt did get dogs and walking more the last few months but also trying to exercise and walk more. She has been on Arimidex and were wondering if it cause the pain. OT contacted Sherri - DR Rao's nurse to follow up with pt. In the meantime - pt do show decrease hip ABD, internal and ext rotation. Pt to switch her walking to the pool and do ABD and ext of hips is want -but tap with feet.  Will follow up in 2 wks with me again    Screen 09/09/20:  Pt for follow up from seen 2 wks ago - stopped the Arimidex for about week per Dr Janese Banks orders- still pain in bilateral hips - about 7/10 pain  She did do some exercises in the pool and pain was better. She started new job - at [employer] and can walk , sit and ride golf cart - so doing okay. Do report having pain with intercourse and would recommend pt to see pelvic health PT -Dr Janese Banks agree  Will send referral to [facility] main - to see [facility] - for pelvic pain and dysfunction  Pt to follow up in 3 wks again with me                                  Patient will benefit from skilled therapeutic intervention in order to improve the following deficits and impairments:           Visit Diagnosis: Pain in left hip  Pain in right hip    Problem List Patient Active Problem List   Diagnosis Date Noted   Cognitive changes 07/03/2020   Annual physical exam 03/19/2020   B12 deficiency 07/19/2019   COVID-19 virus infection 03/13/2019   BMI 29.0-29.9,adult 03/03/2019   Genetic testing 01/29/2019   Family history of breast cancer    Family history of prostate cancer    Goals of care, counseling/discussion 01/03/2019   Malignant neoplasm of left female breast ([place]) 12/28/2018   Abnormal MRI of head 12/11/2018   Dizziness 12/11/2018   Chronic pain  12/11/2018   Vitamin D deficiency 12/11/2018   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 08/21/2018   Insomnia 03/23/2018   Allergic rhinitis 01/03/2018   Anxiety 01/03/2018   Cervical radiculopathy 10/03/2017   Tinea corporis  10/03/2017   Hyperlipidemia 04/04/2017   Breast cyst, left 04/04/2017   Aortic atherosclerosis ([place]) 04/04/2017   Anxiety and depression 03/08/2017   Dysphagia 03/08/2017   Hip pain-right 03/08/2017   Low back pain 03/08/2017   Flank pain 10/18/2016   Asthma 06/01/2016    Rosalyn Gess 09/09/2020, 11:12 AM  [facility] [address] [place], [place], [address] Phone: [phone]   Fax:  [phone]  Name: Leah [name] MRN: [id number] Date of Birth: [birth year]

## 2020-09-10 ENCOUNTER — Other Ambulatory Visit: Payer: Self-pay

## 2020-09-10 DIAGNOSIS — M25559 Pain in unspecified hip: Secondary | ICD-10-CM

## 2020-09-22 ENCOUNTER — Other Ambulatory Visit: Payer: Self-pay

## 2020-09-22 ENCOUNTER — Encounter: Payer: Medicaid Other | Attending: Cardiology

## 2020-09-22 VITALS — Ht 64.5 in | Wt 173.7 lb

## 2020-09-22 DIAGNOSIS — C50212 Malignant neoplasm of upper-inner quadrant of left female breast: Secondary | ICD-10-CM

## 2020-09-22 NOTE — Progress Notes (Signed)
CARE Daily Session Note  Patient Details  Name: Leah [name] MRN: [id number] Date of Birth: 18-Aug-1965 Referring Provider:   April Manson Cancer Associated Rehabilitation & Exercise from 09/22/2020 in [facility] Cardiac and Pulmonary Rehab  Referring Provider Randa Evens MD       Encounter Date: 09/22/2020  Check In:  Session Check In - 09/22/20 1404       Check-In   Supervising physician immediately available to respond to emergencies See telemetry face sheet for immediately available ER MD    Location [facility]-Cardiac & Pulmonary Rehab    Staff Present Coralie Keens, MS, ASCM CEP, Exercise Physiologist;Kelly Rosalia Hammers, MPA, RN;Jessica Luan Pulling, MA, RCEP, CCRP, CCET    Virtual Visit No    Medication changes reported     No    Fall or balance concerns reported    No    Warm-up and Cool-down Not performed (comment)   6MWT and Gym Orientation   Resistance Training Performed No    VAD Patient? No    PAD/SET Patient? No               Exercise Prescription Changes - 09/22/20 1400       Response to Exercise   Blood Pressure (Admit) 132/78    Blood Pressure (Exercise) 144/74    Blood Pressure (Exit) 140/76    Heart Rate (Admit) 83 bpm    Heart Rate (Exercise) 105 bpm    Heart Rate (Exit) 85 bpm    Oxygen Saturation (Admit) 96 %    Oxygen Saturation (Exercise) 94 %    Oxygen Saturation (Exit) 96 %    Rating of Perceived Exertion (Exercise) 11    Perceived Dyspnea (Exercise) 1    Symptoms R. hip pain 6/10    Comments walk test results             Initial Exercise Prescription - 09/22/20 1400       Date of Initial Exercise RX and Referring Provider   Date 09/22/20    Referring Provider Randa Evens MD      Treadmill   MPH 2.5    Grade 0    Minutes 15    METs 2.91      Recumbant Bike   Level 2    RPM 60    Minutes 15    METs 3.8      NuStep   Level 3    SPM 80    Minutes 15    METs 3.8      REL-XR   Level 2    Speed 50    Minutes 15    METs 3.8       Prescription Details   Frequency (times per week) 2    Duration Progress to 30 minutes of continuous aerobic without signs/symptoms of physical distress      Intensity   THRR 40-80% of Max Heartrate 117-151    Ratings of Perceived Exertion 11-13    Perceived Dyspnea 0-4      Progression   Progression Continue to progress workloads to maintain intensity without signs/symptoms of physical distress.      Resistance Training   Training Prescription Yes    Weight 3 lb    Reps 10-15                Social History   Tobacco Use  Smoking Status Former   Packs/day: 1.00   Years: 20.00   Pack years: 20.00   Types: Cigarettes   Quit date:  06/02/2006   Years since quitting: 14.3  Smokeless Tobacco Never    Goals Met:  Proper associated with RPD/PD & O2 Sat Exercise tolerated well Personal goals reviewed No report of cardiac concerns or symptoms  Goals Unmet:  Not Applicable  Comments: First full day of orientation. All starting workloads are established based on the results of the 6 minute walk test done at initial orientation visit.  The plan for exercise progression was also introduced and progression will be customized based on patient's performance and goals.    Dr. Emily Filbert is Medical Director for [place].  Dr. Ottie Glazier is Medical Director for [facility] Pulmonary Rehabilitation.

## 2020-09-24 ENCOUNTER — Other Ambulatory Visit: Payer: Self-pay

## 2020-09-24 DIAGNOSIS — C50212 Malignant neoplasm of upper-inner quadrant of left female breast: Secondary | ICD-10-CM

## 2020-09-24 DIAGNOSIS — Z170 Estrogen receptor positive status [ER+]: Secondary | ICD-10-CM

## 2020-09-24 NOTE — Progress Notes (Signed)
Daily Session Note  Patient Details  Name: Leah [name] MRN: [id number] Date of Birth: [birth year] Referring Provider:   April Manson Cancer Associated Rehabilitation & Exercise from 09/22/2020 in [facility] Cardiac and Pulmonary Rehab  Referring Provider Randa Evens MD       Encounter Date: 09/24/2020  Check In:  Session Check In - 09/24/20 1248       Check-In   Supervising physician immediately available to respond to emergencies See telemetry face sheet for immediately available ER MD    Location [facility]-Cardiac & Pulmonary Rehab    Staff Present Alberteen Sam, MA, RCEP, CCRP, Marylynn Pearson, MS, ASCM CEP, Exercise Physiologist    Virtual Visit No    Medication changes reported     No    Fall or balance concerns reported    No    Warm-up and Cool-down Performed on first and last piece of equipment    Resistance Training Performed Yes    VAD Patient? No    PAD/SET Patient? No      Pain Assessment   Currently in Pain? No/denies                Social History   Tobacco Use  Smoking Status Former   Packs/day: 1.00   Years: 20.00   Pack years: 20.00   Types: Cigarettes   Quit date: 06/02/2006   Years since quitting: 14.3  Smokeless Tobacco Never    Goals Met:  Proper associated with RPD/PD & O2 Sat Exercise tolerated well Personal goals reviewed No report of cardiac concerns or symptoms Strength training completed today  Goals Unmet:  Not Applicable  Comments: First full day of exercise!  Patient was oriented to gym and equipment including functions, settings, policies, and procedures.  Patient's individual exercise prescription and treatment plan were reviewed.  All starting workloads were established based on the results of the 6 minute walk test done at initial orientation visit.  The plan for exercise progression was also introduced and progression will be customized based on patient's performance and goals.    Dr. Emily Filbert is Medical Director  for [place].  Dr. Ottie Glazier is Medical Director for [facility] Pulmonary Rehabilitation.

## 2020-09-29 ENCOUNTER — Encounter: Payer: Medicaid Other | Admitting: *Deleted

## 2020-09-29 ENCOUNTER — Other Ambulatory Visit: Payer: Self-pay

## 2020-09-29 DIAGNOSIS — C50212 Malignant neoplasm of upper-inner quadrant of left female breast: Secondary | ICD-10-CM

## 2020-09-29 NOTE — Progress Notes (Signed)
Daily Session Note  Patient Details  Name: Candance Bohlman MRN: [id number] Date of Birth: 04/14/1965 Referring Provider:   April Manson Cancer Associated Rehabilitation & Exercise from 09/22/2020 in [facility] Cardiac and Pulmonary Rehab  Referring Provider Randa Evens MD       Encounter Date: 09/29/2020  Check In:  Session Check In - 09/29/20 1247       Check-In   Supervising physician immediately available to respond to emergencies See telemetry face sheet for immediately available ER MD    Location [facility]-Cardiac & Pulmonary Rehab    Staff Present Hope Budds, RDN, LDN;Khristen Cheyney Clayton, MA, RCEP, CCRP, Marylynn Pearson, MS, ASCM CEP, Exercise Physiologist    Virtual Visit No    Medication changes reported     No    Fall or balance concerns reported    No    Warm-up and Cool-down Performed on first and last piece of equipment    Resistance Training Performed Yes    VAD Patient? No    PAD/SET Patient? No      Pain Assessment   Currently in Pain? No/denies                Social History   Tobacco Use  Smoking Status Former   Packs/day: 1.00   Years: 20.00   Pack years: 20.00   Types: Cigarettes   Quit date: 06/02/2006   Years since quitting: 14.3  Smokeless Tobacco Never    Goals Met:  Proper associated with RPD/PD & O2 Sat Independence with exercise equipment Exercise tolerated well No report of cardiac concerns or symptoms Strength training completed today  Goals Unmet:  Not Applicable  Comments: Pt able to follow exercise prescription today without complaint.  Will continue to monitor for progression.    Dr. Emily Filbert is Medical Director for [place].  Dr. Ottie Glazier is Medical Director for [facility] Pulmonary Rehabilitation.

## 2020-09-30 ENCOUNTER — Other Ambulatory Visit: Payer: Self-pay

## 2020-09-30 ENCOUNTER — Inpatient Hospital Stay: Payer: Medicaid Other | Attending: Oncology | Admitting: Occupational Therapy

## 2020-09-30 DIAGNOSIS — M25552 Pain in left hip: Secondary | ICD-10-CM

## 2020-09-30 DIAGNOSIS — M25551 Pain in right hip: Secondary | ICD-10-CM

## 2020-09-30 DIAGNOSIS — M25521 Pain in right elbow: Secondary | ICD-10-CM

## 2020-09-30 DIAGNOSIS — M25522 Pain in left elbow: Secondary | ICD-10-CM

## 2020-09-30 NOTE — Therapy (Signed)
[facility] [address] [place], [place], [place], [address] Phone: [phone]   Fax:  [phone]  Occupational Therapy Evaluation  Patient Details  Name: Leah [name] MRN: [id number] Date of Birth: [birth year] No data recorded  Encounter Date: 09/30/2020   OT End of Session - 09/30/20 0933     Visit Number 0             Past Medical History:  Diagnosis Date   Anxiety    Asthma    as a child   Breast cancer ([place])    Carpal tunnel syndrome    R hand    Colon polyps    Depression    Eosinophilic esophagitis    noted in [place] with GI path report 09/29/14    Esophageal stricture    s/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 Edwin Cap    Family history of breast cancer    Family history of prostate cancer    Herniated disc, cervical    s/p epidural injection   Herpes    History of kidney stones    Hyperlipidemia    Low back pain    Migraines    Personal history of chemotherapy    Personal history of radiation therapy     Past Surgical History:  Procedure Laterality Date   BREAST BIOPSY Left 12/25/2018   [place] BX, invasive mammary carcinoma    BREAST LUMPECTOMY     BREAST LUMPECTOMY WITH NEEDLE LOCALIZATION AND AXILLARY SENTINEL LYMPH NODE BX Left 01/16/2019   Procedure: LEFT BREAST LUMPECTOMY WITH NEEDLE LOCALIZATION AND SENTINEL LYMPH NODE BIOPSY;  Surgeon: Jovita Kussmaul, MD;  Location: [facility] ORS;  Service: General;  Laterality: Left;   CESAREAN SECTION     COLONOSCOPY WITH PROPOFOL N/A 03/27/2017   Procedure: COLONOSCOPY WITH PROPOFOL;  Surgeon: Jonathon Bellows, MD;  Location: [facility] ENDOSCOPY;  Service: Gastroenterology;  Laterality: N/A;   ESOPHAGOGASTRODUODENOSCOPY     ESOPHAGOGASTRODUODENOSCOPY (EGD) WITH PROPOFOL N/A 03/27/2017   Procedure: ESOPHAGOGASTRODUODENOSCOPY (EGD) WITH PROPOFOL WITH DILATION;  Surgeon: Jonathon Bellows, MD;  Location: [facility] ENDOSCOPY;  Service: Gastroenterology;   Laterality: N/A;   PORTACATH PLACEMENT N/A 02/15/2019   Procedure: INSERTION PORT-A-CATH WITH POSSIBLE ULTRASOUND;  Surgeon: Jovita Kussmaul, MD;  Location: [facility] ORS;  Service: General;  Laterality: N/A;   RE-EXCISION OF BREAST CANCER,SUPERIOR MARGINS Left 02/15/2019   Procedure: RE-EXCISION OF LEFT BREAST CANCER ANTERIOR MARGINS;  Surgeon: Jovita Kussmaul, MD;  Location: [facility] ORS;  Service: General;  Laterality: Left;   THROAT SURGERY  7001   UMBILICAL HERNIA REPAIR  2006    x 2   w mesh per pt    There were no vitals filed for this visit.   Subjective Assessment - 09/30/20 0932     Subjective  Doing okay -has been of my medication for about 4 wks now - knees feels better but hips still and my elbows - R hip worse - did start the care program    Currently in Pain? Yes    Pain Score 4     Pain Location Hip    Pain Orientation Right    Pain Descriptors / Indicators Aching                DR Janese Banks last visit Assessment and plan- Patient is a 55 y.o. 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  She is s/p lumpectomy with high risk MammaPrint score.  She is s/p 4 cycles of dose dense AC and 12 cycles of weekly Taxol chemotherapy.   She is here for routine follow-up of breast cancer   Clinically patient is doing well with no concern in signs and symptoms of recurrence based on today's exam.  She will continue to take Arimidex.  She was due for Zometa today.  Calcium levels are normal and acceptable to proceed with Zometa today I will see her back in 6 months with CBC with differential CMP for next dose of Zometa.  Her mammogram would be due in September 2022 which would be ordered by Dr. Marlou Starks.     OT SCREEN TODAY 08/26/20:   Pt refer to OT by Cancer Transition team - pt report no issues with lymphedema or L UE AROM and strength. But with increase pain the  last few months in bilateral hips, legs ache and R knee pain. Pain in hips about 8/10. Not working at the moment. Pt did get dogs and walking more the last few months but also trying to exercise and walk more. She has been on Arimidex and were wondering if it cause the pain. OT contacted Sherri - DR Rao's nurse to follow up with pt. In the meantime - pt do show decrease hip ABD, internal and ext rotation. Pt to switch her walking to the pool and do ABD and ext of hips is want -but tap with feet.  Will follow up in 2 wks with me again      Screen 09/09/20: Pt for follow up from seen 2 wks ago - stopped the Arimidex for about week per Dr Janese Banks orders- still pain in bilateral hips - about 7/10 pain She did do some exercises in the pool and pain was better. She started new job - at [employer] and can walk , sit and ride golf cart - so doing okay. Do report having pain with intercourse and would recommend pt to see pelvic health PT -Dr Janese Banks agree Will send referral to [facility] main - to see [place] [facility] - for pelvic pain and dysfunction Pt to follow up in 3 wks again with me   09/30/20 OT SCREEN: Pt return today -report less pain in joints  -- except hips still with R hip worse than the L  ad then elbows - knees and shoulders bettr  She started CARE program and doing ok - did had some pain in the R hip  She did not get schedule with pelvic health yet - they had cancellation today  but she had to be at work Cont to have pelvic pain Upon further assessment - pt appear to have some medial epicondylitis with R worse than L  Tenderness at medial epicondyle and pain with grip and wrist flexion - tightness in flexors for forearm  And then also positive grinding test in bilateral thumbs - thumb CMC pain  And CTS on R - positive Tinel and Phalen's  Pt walk her dogs and hold the leashes - and R one pulls more and cause pain - and using mouse on computer Pt fitted with CMC neoprene splint on R to use for  3 wks to decrease thumb pain and CTS  Pt ed on modifications to tasks - avoid tight and sustained grips- enlarge grips and use larger joints Use heat and massage on medial epicondyle -and stretches with elbow to side Ice massage during  day  Will reassess on 3 wks  an                               Patient will benefit from skilled therapeutic intervention in order to improve the following deficits and impairments:           Visit Diagnosis: Pain in right hip  Pain in left hip  Pain of both elbows    Problem List Patient Active Problem List   Diagnosis Date Noted   Cognitive changes 07/03/2020   Annual physical exam 03/19/2020   B12 deficiency 07/19/2019   COVID-19 virus infection 03/13/2019   BMI 29.0-29.9,adult 03/03/2019   Genetic testing 01/29/2019   Family history of breast cancer    Family history of prostate cancer    Goals of care, counseling/discussion 01/03/2019   Malignant neoplasm of left female breast ([place]) 12/28/2018   Abnormal MRI of head 12/11/2018   Dizziness 12/11/2018   Chronic pain 12/11/2018   Vitamin D deficiency 12/11/2018   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 08/21/2018   Insomnia 03/23/2018   Allergic rhinitis 01/03/2018   Anxiety 01/03/2018   Cervical radiculopathy 10/03/2017   Tinea corporis 10/03/2017   Hyperlipidemia 04/04/2017   Breast cyst, left 04/04/2017   Aortic atherosclerosis ([place]) 04/04/2017   Anxiety and depression 03/08/2017   Dysphagia 03/08/2017   Hip pain-right 03/08/2017   Low back pain 03/08/2017   Flank pain 10/18/2016   Asthma 06/01/2016    Rosalyn Gess OTR/L,CLT 09/30/2020, 9:34 AM  [facility] [address] [place], [place], [address] Phone: [phone]   Fax:  [phone]  Name: Leah [name] MRN: [id number] Date of Birth: 03-Jan-1966

## 2020-10-02 ENCOUNTER — Other Ambulatory Visit: Payer: Self-pay

## 2020-10-02 ENCOUNTER — Ambulatory Visit: Payer: Medicaid Other | Attending: Oncology | Admitting: Physical Therapy

## 2020-10-02 ENCOUNTER — Ambulatory Visit (INDEPENDENT_AMBULATORY_CARE_PROVIDER_SITE_OTHER): Payer: Medicaid Other | Admitting: Psychology

## 2020-10-02 ENCOUNTER — Encounter: Payer: Self-pay | Admitting: Physical Therapy

## 2020-10-02 DIAGNOSIS — M542 Cervicalgia: Secondary | ICD-10-CM | POA: Insufficient documentation

## 2020-10-02 DIAGNOSIS — M6281 Muscle weakness (generalized): Secondary | ICD-10-CM | POA: Insufficient documentation

## 2020-10-02 DIAGNOSIS — R2689 Other abnormalities of gait and mobility: Secondary | ICD-10-CM | POA: Diagnosis not present

## 2020-10-02 DIAGNOSIS — M5441 Lumbago with sciatica, right side: Secondary | ICD-10-CM | POA: Insufficient documentation

## 2020-10-02 DIAGNOSIS — F418 Other specified anxiety disorders: Secondary | ICD-10-CM

## 2020-10-02 DIAGNOSIS — M25561 Pain in right knee: Secondary | ICD-10-CM | POA: Diagnosis present

## 2020-10-02 DIAGNOSIS — M25559 Pain in unspecified hip: Secondary | ICD-10-CM | POA: Insufficient documentation

## 2020-10-02 DIAGNOSIS — G8929 Other chronic pain: Secondary | ICD-10-CM | POA: Insufficient documentation

## 2020-10-02 NOTE — Therapy (Addendum)
[place] MAIN [facility] SERVICES [address] [place], [place], [address] Phone: [phone]   Fax:  [phone]  Physical Therapy Evaluation  Patient Details  Name: Leah [name] MRN: [id number] Date of Birth: 1965/04/30 Referring Provider (PT): Dr. Janese Banks   Encounter Date: 10/02/2020   PT End of Session - 10/03/20 1711     Visit Number 1    Number of Visits 10    Date for PT Re-Evaluation 12/11/20    Activity Tolerance Patient tolerated treatment well    Behavior During Therapy [facility] for tasks assessed/performed             Past Medical History:  Diagnosis Date   Anxiety    Asthma    as a child   Breast cancer ([place])    Carpal tunnel syndrome    R hand    Colon polyps    Depression    Eosinophilic esophagitis    noted in [place] with GI path report 09/29/14    Esophageal stricture    s/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 Edwin Cap    Family history of breast cancer    Family history of prostate cancer    Herniated disc, cervical    s/p epidural injection   Herpes    History of kidney stones    Hyperlipidemia    Low back pain    Migraines    Personal history of chemotherapy    Personal history of radiation therapy     Past Surgical History:  Procedure Laterality Date   BREAST BIOPSY Left 12/25/2018   [place] BX, invasive mammary carcinoma    BREAST LUMPECTOMY     BREAST LUMPECTOMY WITH NEEDLE LOCALIZATION AND AXILLARY SENTINEL LYMPH NODE BX Left 01/16/2019   Procedure: LEFT BREAST LUMPECTOMY WITH NEEDLE LOCALIZATION AND SENTINEL LYMPH NODE BIOPSY;  Surgeon: Jovita Kussmaul, MD;  Location: [facility] ORS;  Service: General;  Laterality: Left;   CESAREAN SECTION     COLONOSCOPY WITH PROPOFOL N/A 03/27/2017   Procedure: COLONOSCOPY WITH PROPOFOL;  Surgeon: Jonathon Bellows, MD;  Location: [facility] ENDOSCOPY;  Service: Gastroenterology;  Laterality: N/A;   ESOPHAGOGASTRODUODENOSCOPY     ESOPHAGOGASTRODUODENOSCOPY (EGD)  WITH PROPOFOL N/A 03/27/2017   Procedure: ESOPHAGOGASTRODUODENOSCOPY (EGD) WITH PROPOFOL WITH DILATION;  Surgeon: Jonathon Bellows, MD;  Location: [facility] ENDOSCOPY;  Service: Gastroenterology;  Laterality: N/A;   PORTACATH PLACEMENT N/A 02/15/2019   Procedure: INSERTION PORT-A-CATH WITH POSSIBLE ULTRASOUND;  Surgeon: Jovita Kussmaul, MD;  Location: [facility] ORS;  Service: General;  Laterality: N/A;   RE-EXCISION OF BREAST CANCER,SUPERIOR MARGINS Left 02/15/2019   Procedure: RE-EXCISION OF LEFT BREAST CANCER ANTERIOR MARGINS;  Surgeon: Jovita Kussmaul, MD;  Location: [facility] ORS;  Service: General;  Laterality: Left;   THROAT SURGERY  123456   UMBILICAL HERNIA REPAIR  2006    x 2   w mesh per pt    There were no vitals filed for this visit.    Subjective Assessment - 10/02/20 0812     Subjective 1) pelvic pain with sexual intercourse. Pt had not had sex for 6-8 months. When pt tried in June 2022, pt had completed her chemo andnraidation Tx for Stage I Breast CA for just over a year.  Twelve years ago, pt underwent ESSURE procedure after she had her 3rd miscarriage.    Pain does not linger after activity. Pain level 10.   2) LBP started after she had her first child via C-section. Currenltly , pt feels  achey in low back , usually on R side. Radiating pain  to posterior knee with any activity: standing, walking, sitting. This pain has been the same even after CA Tx.  Pt is able to walk 3 steps and the pain kicks in and sometimes the pain does no kick in until the end of the day.    3) B hip pain with R more prominent : It started 3-4 months ago. There is a constant achiness.  Pt started the CARE program for exercise and it got better by 5%.  It affect her walking her dogs ( 80 lbs, 60lbs)    4) neck pain started on the R shoulder blade and it went up to her neck in 2016. Pain occurs with turning L rotation.  Pt tried injections on the R and nerves were burned on the R and the pain relief for a year and she gained her  ROM back. It started coming back last year during her CA Tx. Pt received more shots at this time which helped. This year, the L side started to hurt and she got injections. Prior to CA Tx, pt had numbnness in R hand and it got better.  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 This causes her to have to grab items harder.  Pt does not have neuropathy in her B feet anymore after getting acupuncture.  Pt has only undergone PT for hip pain and not then LBP, neck pain.    5) R knee pain after 2 falls that occurred within 2 months of each other. Pt fell twice on her R knee with these falls. It hurts   2/10      Pertinent History C section, umbilical hernia  repair x 2, lumpectectomy on L 2020 for Stage I Breast Cancer, Denied falls onto tailbone but she had 2 falls within the last 6 months with landed on R knee during these 2 falls. Underwent chemo and radiation -finished in May and August 2021.    Patient Stated Goals Walk up and down stairs without cringing.     Pain Onset More than a month ago                [facility] PT Assessment - 10/02/20 0838       Assessment   Medical Diagnosis Pain pelvic and thigh regions    Referring Provider (PT) Dr. Janese Banks      Precautions   Precautions None      Restrictions   Weight Bearing Restrictions No      Balance Screen   Has the patient fallen in the past 6 months Yes    How many times? 2    Has the patient had a decrease in activity level because of a fear of falling?  No      Strength   Overall Strength Comments B hip flex, knee flex/ext 5/5, R DF/EV 4-/5, L 5/5. PF with single UE :  B 10 reps, R hip abd 3/5, L 3+/5      Bed Mobility   Bed Mobility --   head lift               Objective measurements completed on examination: See above findings.        [facility] Adult PT Treatment/Exercise - 10/03/20 1655       Neuro Re-ed    Neuro Re-ed Details  cued for HEP to mobilize thoracic ,lower shoulder      Manual Therapy   Manual therapy comments Thoracic mobilization with MWM                        PT Long Term Goals - 10/02/20 0826       PT LONG TERM GOAL #1   Title Pt will notice less LBP by 75% improvement and be able to walk at her next job without LBP across one day,    Time 6    Period Weeks    Status New    Target Date 11/13/20      PT LONG TERM GOAL #2   Title Pt's FOTO pelvic pain will improve by    Time 10    Period Weeks    Status New    Target Date 12/11/20      PT LONG TERM GOAL #3   Title Pt will demo increased hip abduction strength from  to    in order to wealk her dogs which weigh ( 80 lbs, 60lbs)    Time 8    Period Weeks    Status New    Target Date 11/27/20      PT LONG TERM GOAL #4   Title Pt willl report less radiating pain down to first 3 digits of B hands by 50% in fequency and intensity so she can grab items for ADLs    Time 10    Period Weeks    Status New    Target Date 12/11/20      PT LONG TERM GOAL #5   Title Pt will improve BLE strength in order to minimize falls and walk with less pain    Baseline , R DF/EV 4-/5, L 5/5. PF with single UE :  B 10 reps, R hip abd 3/5, L 3+/5    Time 8    Period Weeks    Status New    Target Date 11/27/20      Additional Long Term Goals   Additional Long Term Goals --      PT LONG TERM GOAL #6   Title Pt will demo no more higher R shoulder/ R hip inorder to demo reciporcal gait pattern to minimize radiating LBP    Time 2    Period Weeks    Status New    Target Date 10/16/20      PT LONG TERM GOAL #7   Title Pt will demo decreased abdominal bulge at linea alba in order to demo improved IAP system for LBP, hip pain, and pelvic pain    Time 8    Period Weeks    Status New    Target Date 11/27/20                    Plan - 10/02/20 [id number]     Clinical Impression Statement  Pt is a 55  yo  who presents with pelvic  pain, B hip pain, CLBP, R knee pain which impacts hr ADLs and QOL. Pt's musculoskeletal assessment revealed uneven pelvic and shoulder height, abdominal weakness with diastasis recti, dyscoordination and strength of pelvic floor mm, weak hip weakness, poor body mechanics which places strain on the abdominal/pelvic floor mm.   These are deficits that indicate an ineffective intraabdominal pressure system associated with increased risk for pt's Sx.  Pt was provided education on etiology of Sx with anatomy, physiology explanation with images along  with the benefits of customized pelvic PT Tx based on pt's medical conditions and musculoskeletal deficits.  Explained the physiology of deep core mm coordination and roles of pelvic floor function in urination, defecation, sexual function, and postural control with deep core mm system.    Regional interdependent approaches will yield greater benefits in pt's POC due to the complexity of pt's medical Hx and the significant impact their Sx have had on their QOL.   Following Tx today which pt tolerated without complaints, pt demo'd equal alignment of pelvic girdle and increased spinal mobility and showed IND with HEP. Plan to address DRA and add deep core exercises at next session.   Pt benefits from skilled PT.    Examination-Activity Limitations Stairs;Other;Locomotion Level;Stand;Lift    Stability/Clinical Decision Making Evolving/Moderate complexity    Clinical Decision Making Moderate    Clinical Presentation due to: poor trunk and pelvic stability and IAP system, hip abduction weakness, impact of cancer Tx, 2 falls within past 6 months with impact to R knee    Rehab Potential Good    PT Frequency 1x / week    PT Duration Other (comment)   10   PT Treatment/Interventions Manual techniques;Therapeutic exercise;Therapeutic activities;Gait training;Neuromuscular re-education;Patient/family education    PT Next Visit Plan --    Consulted and Agree with  Plan of Care Patient             Patient will benefit from skilled therapeutic intervention in order to improve the following deficits and impairments:  Pain, Postural dysfunction, Improper body mechanics, Decreased strength, Difficulty walking, Decreased mobility, Decreased coordination, Decreased balance, Abnormal gait, Increased fascial restricitons, Increased muscle spasms, Decreased scar mobility, Decreased range of motion, Decreased endurance  Visit Diagnosis: Other abnormalities of gait and mobility  Muscle weakness (generalized)  Chronic right-sided low back pain with right-sided sciatica  Neck pain, chronic  Pain in joint involving pelvic region and thigh, unspecified laterality  Chronic pain of right knee     Problem List Patient Active Problem List   Diagnosis Date Noted   Cognitive changes 07/03/2020   Annual physical exam 03/19/2020   B12 deficiency 07/19/2019   COVID-19 virus infection 03/13/2019   BMI 29.0-29.9,adult 03/03/2019   Genetic testing 01/29/2019   Family history of breast cancer    Family history of prostate cancer    Goals of care, counseling/discussion 01/03/2019   Malignant neoplasm of left female breast ([place]) 12/28/2018   Abnormal MRI of head 12/11/2018   Dizziness 12/11/2018   Chronic pain 12/11/2018   Vitamin D deficiency 12/11/2018   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 08/21/2018   Insomnia 03/23/2018   Allergic rhinitis 01/03/2018   Anxiety 01/03/2018   Cervical radiculopathy 10/03/2017   Tinea corporis 10/03/2017   Hyperlipidemia 04/04/2017   Breast cyst, left 04/04/2017   Aortic atherosclerosis ([place]) 04/04/2017   Anxiety and depression 03/08/2017   Dysphagia 03/08/2017   Hip pain-right 03/08/2017   Low back pain 03/08/2017   Flank pain 10/18/2016   Asthma 06/01/2016    Jerl Mina ,PT, DPT, E-RYT  10/03/2020, 5:17 PM  [facility] [address]  [place], [place], [address] Phone: [phone]   Fax:  [phone]  Name: Magdalene Petzoldt MRN: [id number] Date of Birth: [birth year]

## 2020-10-02 NOTE — Patient Instructions (Addendum)
  Clam Shell 45 Degrees  Lying with hips and knees bent 45, one pillow between knees and ankles. Heel together, toes apart like ballerina,  Lift knee with exhale while pressing heels together. Be sure pelvis does not roll backward. Do not arch back. Do 20 times, each leg, 2 times per day.     Complimentary stretch: [employer] _ foot over _ thigh,  3 breaths  * Keep pelvis levelled with tactile cue with hand under back of hips  * Slide the ankle of the supporting foot out to decrease the angle which can help level the pelvis   ___   Lengthen Back rib by R  shoulder    Lie on L  side , pillow between knees and under head  Pull  arm overhead over mattress, grab the edge of mattress,pull it upward, drawing elbow away from ears  Breathing 10 reps  Open book  Lying on  _ side , rotating  __ only this week  Rotating onto pillow /yoga block  --relax elbow and keep chin tucked  Pillow/ Block between knees  10 reps  ___  Sitting with feet flat on ground , not crossed   __   Avoid straining pelvic floor, abdominal muscles , spine  Use log rolling technique instead of getting out of bed with your neck or the sit-up     Log rolling into and out of bed   Log rolling into and out of bed If getting out of bed on R side, Bent knees, scoot hips/ shoulder to L  Raise R arm completely overhead, rolling onto armpit  Then lower bent knees to bed to get into complete side lying position  Then drop legs off bed, and push up onto R elbow/forearm, and use L hand to push onto the bed

## 2020-10-03 NOTE — Addendum Note (Signed)
Addended by: Jerl Mina on: 10/03/2020 05:22 PM   Modules accepted: Orders

## 2020-10-20 ENCOUNTER — Other Ambulatory Visit: Payer: Self-pay | Admitting: Internal Medicine

## 2020-10-20 DIAGNOSIS — R110 Nausea: Secondary | ICD-10-CM

## 2020-10-20 DIAGNOSIS — H81399 Other peripheral vertigo, unspecified ear: Secondary | ICD-10-CM

## 2020-10-21 ENCOUNTER — Inpatient Hospital Stay: Payer: Medicaid Other | Attending: Oncology | Admitting: Occupational Therapy

## 2020-10-21 ENCOUNTER — Other Ambulatory Visit: Payer: Self-pay

## 2020-10-21 DIAGNOSIS — M25521 Pain in right elbow: Secondary | ICD-10-CM

## 2020-10-21 NOTE — Therapy (Signed)
[facility] [address] [place], [place], [place], [address] Phone: [phone]   Fax:  [phone]  Occupational Therapy Screen:  Patient Details  Name: Leah [name] MRN: [id number] Date of Birth: 23-Jan-1966 No data recorded  Encounter Date: 10/21/2020   OT End of Session - 10/21/20 1016     Visit Number 0             Past Medical History:  Diagnosis Date   Anxiety    Asthma    as a child   Breast cancer ([place])    Carpal tunnel syndrome    R hand    Colon polyps    Depression    Eosinophilic esophagitis    noted in [place] with GI path report 09/29/14    Esophageal stricture    s/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 Edwin Cap    Family history of breast cancer    Family history of prostate cancer    Herniated disc, cervical    s/p epidural injection   Herpes    History of kidney stones    Hyperlipidemia    Low back pain    Migraines    Personal history of chemotherapy    Personal history of radiation therapy     Past Surgical History:  Procedure Laterality Date   BREAST BIOPSY Left 12/25/2018   [place] BX, invasive mammary carcinoma    BREAST LUMPECTOMY     BREAST LUMPECTOMY WITH NEEDLE LOCALIZATION AND AXILLARY SENTINEL LYMPH NODE BX Left 01/16/2019   Procedure: LEFT BREAST LUMPECTOMY WITH NEEDLE LOCALIZATION AND SENTINEL LYMPH NODE BIOPSY;  Surgeon: Jovita Kussmaul, MD;  Location: [facility] ORS;  Service: General;  Laterality: Left;   CESAREAN SECTION     COLONOSCOPY WITH PROPOFOL N/A 03/27/2017   Procedure: COLONOSCOPY WITH PROPOFOL;  Surgeon: Jonathon Bellows, MD;  Location: [facility] ENDOSCOPY;  Service: Gastroenterology;  Laterality: N/A;   ESOPHAGOGASTRODUODENOSCOPY     ESOPHAGOGASTRODUODENOSCOPY (EGD) WITH PROPOFOL N/A 03/27/2017   Procedure: ESOPHAGOGASTRODUODENOSCOPY (EGD) WITH PROPOFOL WITH DILATION;  Surgeon: Jonathon Bellows, MD;  Location: [facility] ENDOSCOPY;  Service: Gastroenterology;   Laterality: N/A;   PORTACATH PLACEMENT N/A 02/15/2019   Procedure: INSERTION PORT-A-CATH WITH POSSIBLE ULTRASOUND;  Surgeon: Jovita Kussmaul, MD;  Location: [facility] ORS;  Service: General;  Laterality: N/A;   RE-EXCISION OF BREAST CANCER,SUPERIOR MARGINS Left 02/15/2019   Procedure: RE-EXCISION OF LEFT BREAST CANCER ANTERIOR MARGINS;  Surgeon: Jovita Kussmaul, MD;  Location: [facility] ORS;  Service: General;  Laterality: Left;   THROAT SURGERY  1275   UMBILICAL HERNIA REPAIR  2006    x 2   w mesh per pt    There were no vitals filed for this visit.   Subjective Assessment - 10/21/20 1014     Subjective  I did not had good 2 wks - my ex husband past away suddenly and had to drive to [place] - so that was not good for my elbows, hands and thums - I can tell the splints helps for numbness in R hand , but L going numb. Thumb pain is better- but the elbows not good- that dogs still pulls me - did order me the soft black splint for L hand because the R one helps    Currently in Pain? Yes    Pain Score 8     Pain Location Hand   elbows   Pain Orientation Right;Left    Pain Descriptors / Indicators Aching;Tender;Numbness  Pain Type Acute pain                 DR Janese Banks last visit Assessment and plan- Patient is a 55 y.o. 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  She is s/p lumpectomy with high risk MammaPrint score.  She is s/p 4 cycles of dose dense AC and 12 cycles of weekly Taxol chemotherapy.   She is here for routine follow-up of breast cancer   Clinically patient is doing well with no concern in signs and symptoms of recurrence based on today's exam.  She will continue to take Arimidex.  She was due for Zometa today.  Calcium levels are normal and acceptable to proceed with Zometa today I will see her back in 6 months with CBC with differential CMP for next dose of  Zometa.  Her mammogram would be due in September 2022 which would be ordered by Dr. Marlou Starks.     OT SCREEN TODAY 08/26/20:   Pt refer to OT by Cancer Transition team - pt report no issues with lymphedema or L UE AROM and strength. But with increase pain the last few months in bilateral hips, legs ache and R knee pain. Pain in hips about 8/10. Not working at the moment. Pt did get dogs and walking more the last few months but also trying to exercise and walk more. She has been on Arimidex and were wondering if it cause the pain. OT contacted Sherri - DR Rao's nurse to follow up with pt. In the meantime - pt do show decrease hip ABD, internal and ext rotation. Pt to switch her walking to the pool and do ABD and ext of hips is want -but tap with feet.  Will follow up in 2 wks with me again      Screen 09/09/20: Pt for follow up from seen 2 wks ago - stopped the Arimidex for about week per Dr Janese Banks orders- still pain in bilateral hips - about 7/10 pain She did do some exercises in the pool and pain was better. She started new job - at [employer] and can walk , sit and ride golf cart - so doing okay. Do report having pain with intercourse and would recommend pt to see pelvic health PT -Dr Janese Banks agree Will send referral to [facility] main - to see [facility] - for pelvic pain and dysfunction Pt to follow up in 3 wks again with me    09/30/20 OT SCREEN: Pt return today -report less pain in joints  -- except hips still with R hip worse than the L  ad then elbows - knees and shoulders bettr She started CARE program and doing ok - did had some pain in the R hip She did not get schedule with pelvic health yet - they had cancellation today  but she had to be at work Cont to have pelvic pain Upon further assessment - pt appear to have some medial epicondylitis with R worse than L Tenderness at medial epicondyle and pain with grip and wrist flexion - tightness in flexors for forearm And then also positive  grinding test in bilateral thumbs - thumb CMC pain And CTS on R - positive Tinel and Phalen's Pt walk her dogs and hold the leashes - and R one pulls more and cause pain - and using mouse on computer Pt fitted with [facility]  neoprene splint on R to use for 3 wks to decrease thumb pain and CTS Pt ed on modifications to tasks - avoid tight and sustained grips- enlarge grips and use larger joints Use heat and massage on medial epicondyle -and stretches with elbow to side Ice massage during day Will reassess on 3 wks   OT SCREEN 10/21/20:      Pt return today - report she had to drive last week to [place] and back -and bilateral hands, elbows and hip pain increased again She started CARE program and doing ok - did had some pain in the R hip She had one visit with pelvic health  Reassessment  elbow and hands again- pt appear to have some medial epicondylitis with R worse than L from gripping Tenderness at medial epicondyle and pain with grip and wrist flexion - tightness in flexors for forearms Bilateral grinding test  decrease this date in bilateral thumbs- she was fitted with bilateral CMC neoprene splints about 3 wks ago to use  Cont to have CTS on bilateral hands this date - positive Tinel and Phalen's Pt walk her dogs and hold the leashes - and R one pulls more and cause pain - and using mouse on computer She also drove since last time seen to CT  Pt ed on picking up and application of counter force strap for R elbow medial epicondyle pain. To use during day with activities  Recommend also different leash for dog -maybe one that go around her waist.  REview again modifications to tasks - avoid tight and sustained grips- enlarge grips and use larger joints or palms Use heat and massage on medial epicondyles and bilateral CT -and stretches with elbow to side Ice massage during day And request OT to ask DR Janese Banks for OT eval and tx for  pain in bilateral arms - with bilateral medial epicondylitis , CTS                                             Patient will benefit from skilled therapeutic intervention in order to improve the following deficits and impairments:           Visit Diagnosis: Pain of both elbows    Problem List Patient Active Problem List   Diagnosis Date Noted   Cognitive changes 07/03/2020   Annual physical exam 03/19/2020   B12 deficiency 07/19/2019   COVID-19 virus infection 03/13/2019   BMI 29.0-29.9,adult 03/03/2019   Genetic testing 01/29/2019   Family history of breast cancer    Family history of prostate cancer    Goals of care, counseling/discussion 01/03/2019   Malignant neoplasm of left female breast ([place]) 12/28/2018   Abnormal MRI of head 12/11/2018   Dizziness 12/11/2018   Chronic pain 12/11/2018   Vitamin D deficiency 12/11/2018   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 08/21/2018   Insomnia 03/23/2018   Allergic rhinitis 01/03/2018   Anxiety 01/03/2018   Cervical radiculopathy 10/03/2017   Tinea corporis 10/03/2017   Hyperlipidemia 04/04/2017   Breast cyst, left 04/04/2017   Aortic atherosclerosis ([place]) 04/04/2017   Anxiety and depression 03/08/2017   Dysphagia 03/08/2017   Hip pain-right 03/08/2017   Low back pain 03/08/2017   Flank pain 10/18/2016   Asthma 06/01/2016    Rosalyn Gess 10/21/2020, 10:19 AM  [facility] [address] [place], [address]  [place], [place], [address] Phone: [phone]   Fax:  [phone]  Name: Shaunie Boehm MRN: [id number] Date of Birth: 1965-12-28

## 2020-10-22 ENCOUNTER — Telehealth: Payer: Self-pay

## 2020-10-22 NOTE — Telephone Encounter (Signed)
Spoke with patient regarding CARE: will be placed on hold until 9/1 due to family event.

## 2020-10-27 ENCOUNTER — Other Ambulatory Visit: Payer: Self-pay

## 2020-10-27 ENCOUNTER — Ambulatory Visit: Payer: Medicaid Other | Attending: Oncology | Admitting: Physical Therapy

## 2020-10-27 DIAGNOSIS — M25561 Pain in right knee: Secondary | ICD-10-CM | POA: Diagnosis present

## 2020-10-27 DIAGNOSIS — M5441 Lumbago with sciatica, right side: Secondary | ICD-10-CM | POA: Insufficient documentation

## 2020-10-27 DIAGNOSIS — M533 Sacrococcygeal disorders, not elsewhere classified: Secondary | ICD-10-CM | POA: Diagnosis present

## 2020-10-27 DIAGNOSIS — M25559 Pain in unspecified hip: Secondary | ICD-10-CM | POA: Diagnosis present

## 2020-10-27 DIAGNOSIS — M542 Cervicalgia: Secondary | ICD-10-CM | POA: Diagnosis present

## 2020-10-27 DIAGNOSIS — M6281 Muscle weakness (generalized): Secondary | ICD-10-CM | POA: Insufficient documentation

## 2020-10-27 DIAGNOSIS — G8929 Other chronic pain: Secondary | ICD-10-CM | POA: Insufficient documentation

## 2020-10-27 DIAGNOSIS — R2689 Other abnormalities of gait and mobility: Secondary | ICD-10-CM | POA: Diagnosis present

## 2020-10-27 NOTE — Therapy (Signed)
[place] MAIN [facility] [address] [place], [place], [address] Phone: [phone]   Fax:  [phone]  Physical Therapy Treatment  Patient Details  Name: Leah [name] MRN: [id number] Date of Birth: Jan 29, 1966 Referring Provider (PT): Dr. Janese Banks   Encounter Date: 10/27/2020   PT End of Session - 10/27/20 1807     Visit Number 2    Number of Visits 10    Date for PT Re-Evaluation 12/11/20    PT Start Time [id number]    PT Stop Time 1808    PT Time Calculation (min) 58 min    Activity Tolerance Patient tolerated treatment well    Behavior During Therapy [facility] for tasks assessed/performed             Past Medical History:  Diagnosis Date   Anxiety    Asthma    as a child   Breast cancer ([place])    Carpal tunnel syndrome    R hand    Colon polyps    Depression    Eosinophilic esophagitis    noted in [place] with GI path report 09/29/14    Esophageal stricture    s/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 Edwin Cap    Family history of breast cancer    Family history of prostate cancer    Herniated disc, cervical    s/p epidural injection   Herpes    History of kidney stones    Hyperlipidemia    Low back pain    Migraines    Personal history of chemotherapy    Personal history of radiation therapy     Past Surgical History:  Procedure Laterality Date   BREAST BIOPSY Left 12/25/2018   [place] BX, invasive mammary carcinoma    BREAST LUMPECTOMY     BREAST LUMPECTOMY WITH NEEDLE LOCALIZATION AND AXILLARY SENTINEL LYMPH NODE BX Left 01/16/2019   Procedure: LEFT BREAST LUMPECTOMY WITH NEEDLE LOCALIZATION AND SENTINEL LYMPH NODE BIOPSY;  Surgeon: Jovita Kussmaul, MD;  Location: [facility] ORS;  Service: General;  Laterality: Left;   CESAREAN SECTION     COLONOSCOPY WITH PROPOFOL N/A 03/27/2017   Procedure: COLONOSCOPY WITH PROPOFOL;  Surgeon: Jonathon Bellows, MD;  Location: [facility] ENDOSCOPY;  Service: Gastroenterology;   Laterality: N/A;   ESOPHAGOGASTRODUODENOSCOPY     ESOPHAGOGASTRODUODENOSCOPY (EGD) WITH PROPOFOL N/A 03/27/2017   Procedure: ESOPHAGOGASTRODUODENOSCOPY (EGD) WITH PROPOFOL WITH DILATION;  Surgeon: Jonathon Bellows, MD;  Location: [facility] ENDOSCOPY;  Service: Gastroenterology;  Laterality: N/A;   PORTACATH PLACEMENT N/A 02/15/2019   Procedure: INSERTION PORT-A-CATH WITH POSSIBLE ULTRASOUND;  Surgeon: Jovita Kussmaul, MD;  Location: [facility] ORS;  Service: General;  Laterality: N/A;   RE-EXCISION OF BREAST CANCER,SUPERIOR MARGINS Left 02/15/2019   Procedure: RE-EXCISION OF LEFT BREAST CANCER ANTERIOR MARGINS;  Surgeon: Jovita Kussmaul, MD;  Location: [facility] ORS;  Service: General;  Laterality: Left;   THROAT SURGERY  123456   UMBILICAL HERNIA REPAIR  2006    x 2   w mesh per pt    There were no vitals filed for this visit.   Subjective Assessment - 10/27/20 1821     Subjective Pt has been handling her exhusband's sudden death. Her son is living with her and will plan to drive back to [facility] with him next week. Pt has not been able to do her HEP that much    Pertinent History C section, umbilical hernia  repair x 2, lumpectectomy on L 2020 for Stage I Breast  Cancer, Denied falls onto tailbone but she had 2 falls within the last 6 months with landed on R knee during these 2 falls. Underwent chemo and radiation -finished in May and August 2021.    Patient Stated Goals Walk up and down stairs without cringing.     Pain Onset More than a month ago                [facility] PT Assessment - 10/27/20 1808       Palpation   Spinal mobility tightness at L posterior intercostals/ medial scapula    Palpation comment iliac crest levelled after shoe lift in R, R shoulder still higher      Ambulation/Gait   Gait Comments preTx: R trunk lean, POst Tx less trunk lean /weakness L low back  (post Tx: 1.09 m/s compared to 1.33 m/s at last session)                           [place] [facility] PT Treatment/Exercise -  10/27/20 1817       Neuro Re-ed    Neuro Re-ed Details  cued for HEP      Modalities   Modalities Moist Heat      Moist Heat Therapy   Number Minutes Moist Heat 10 Minutes    Moist Heat Location --   L thoracic, restorative poses ( L sideflexion, L trunk rotation , supported)     Manual Therapy   Manual therapy comments STM/MWM at problem areas noted in assessment                         PT Long Term Goals - 10/02/20 0826       PT LONG TERM GOAL #1   Title Pt will notice less LBP by 75% improvement and be able to walk at her next job without LBP across one day,    Time 6    Period Weeks    Status New    Target Date 11/13/20      PT LONG TERM GOAL #2   Title Pt's FOTO pelvic pain will improve by    Time 10    Period Weeks    Status New    Target Date 12/11/20      PT LONG TERM GOAL #3   Title Pt will demo increased hip abduction strength from  to    in order to wealk her dogs which weigh ( 80 lbs, 60lbs)    Time 8    Period Weeks    Status New    Target Date 11/27/20      PT LONG TERM GOAL #4   Title Pt willl report less radiating pain down to first 3 digits of B hands by 50% in fequency and intensity so she can grab items for ADLs    Time 10    Period Weeks    Status New    Target Date 12/11/20      PT LONG TERM GOAL #5   Title Pt will improve BLE strength in order to minimize falls and walk with less pain    Baseline , R DF/EV 4-/5, L 5/5. PF with single UE :  B 10 reps, R hip abd 3/5, L 3+/5    Time 8    Period Weeks    Status New    Target Date 11/27/20      Additional Long Term Goals  Additional Long Term Goals --      PT LONG TERM GOAL #6   Title Pt will demo no more higher R shoulder/ R hip inorder to demo reciporcal gait pattern to minimize radiating LBP    Time 2    Period Weeks    Status New    Target Date 10/16/20      PT LONG TERM GOAL #7   Title Pt will demo decreased abdominal bulge at linea alba in order to demo  improved IAP system for LBP, hip pain, and pelvic pain    Time 8    Period Weeks    Status New    Target Date 11/27/20                   Plan - 10/27/20 1808     Clinical Impression Statement Pt demo'd improved gait with R shoe lift in R shoe. Pt reported less R groin pain  ingait. Tightness in L shoulder decreased  after manual Tx. Plan to increase low back strength from L rib to pelvis at next session to minimize trunk lean. Gait speed increased from 1.33 m/s to 1.09 m/s from first session to today. Pelvic girdle alignment will help yield better outcomes with pelvic issues and LBP. Pt continues to benefit from skilled PT   Examination-Activity Limitations Stairs;Other;Locomotion Level;Stand;Lift    Stability/Clinical Decision Making Evolving/Moderate complexity    Rehab Potential Good    PT Frequency 1x / week    PT Duration Other (comment)   10   PT Treatment/Interventions Manual techniques;Therapeutic exercise;Therapeutic activities;Gait training;Neuromuscular re-education;Patient/family education    Consulted and Agree with Plan of Care Patient             Patient will benefit from skilled therapeutic intervention in order to improve the following deficits and impairments:  Pain, Postural dysfunction, Improper body mechanics, Decreased strength, Difficulty walking, Decreased mobility, Decreased coordination, Decreased balance, Abnormal gait, Increased fascial restricitons, Increased muscle spasms, Decreased scar mobility, Decreased range of motion, Decreased endurance  Visit Diagnosis: Other abnormalities of gait and mobility  Muscle weakness (generalized)  Chronic right-sided low back pain with right-sided sciatica  Neck pain, chronic  Pain in joint involving pelvic region and thigh, unspecified laterality  Chronic pain of right knee     Problem List Patient Active Problem List   Diagnosis Date Noted   Cognitive changes 07/03/2020   Annual physical exam  03/19/2020   B12 deficiency 07/19/2019   COVID-19 virus infection 03/13/2019   BMI 29.0-29.9,adult 03/03/2019   Genetic testing 01/29/2019   Family history of breast cancer    Family history of prostate cancer    Goals of care, counseling/discussion 01/03/2019   Malignant neoplasm of left female breast ([place]) 12/28/2018   Abnormal MRI of head 12/11/2018   Dizziness 12/11/2018   Chronic pain 12/11/2018   Vitamin D deficiency 12/11/2018   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 08/21/2018   Insomnia 03/23/2018   Allergic rhinitis 01/03/2018   Anxiety 01/03/2018   Cervical radiculopathy 10/03/2017   Tinea corporis 10/03/2017   Hyperlipidemia 04/04/2017   Breast cyst, left 04/04/2017   Aortic atherosclerosis ([place]) 04/04/2017   Anxiety and depression 03/08/2017   Dysphagia 03/08/2017   Hip pain-right 03/08/2017   Low back pain 03/08/2017   Flank pain 10/18/2016   Asthma 06/01/2016    Jerl Mina ,PT, DPT, E-RYT  10/27/2020, 6:23 PM  [place] [facility] MAIN [facility] SERVICES [address] [place], [place], [address]  Phone: [phone]   Fax:  [phone]  Name: Leah [name] MRN: [id number] Date of Birth: [birth year]

## 2020-10-27 NOTE — Patient Instructions (Addendum)
  Lengthen Back rib by L  shoulder    Lie on R side , pillow between knees and under head  Pull  arm overhead over mattress, grab the edge of mattress,pull it upward, drawing elbow away from ears  Breathing 10 reps  Open book (handout)  Lying on  _ side , rotating  __ only this week  Rotating onto pillow /yoga block  Pillow/ Block between knees  10 reps   Wear shoe lift in R shoe   __  Bear stretch by door way

## 2020-10-28 ENCOUNTER — Telehealth: Payer: Self-pay | Admitting: Internal Medicine

## 2020-10-28 NOTE — Telephone Encounter (Signed)
Pt returned your call.  

## 2020-10-28 NOTE — Telephone Encounter (Signed)
Patient called to schedule her mammogram at [facility] and was not able to schedule due to needing an order.She would like for an order for a diagnostic mammogram can be sent to [facility].

## 2020-10-28 NOTE — Telephone Encounter (Signed)
October of 2020 Patient was diagnosed with breast cancer. Patient has sense gone through chemo treatments. Patient states her mammograms have to be diagnostic.   Please advise on placing order.

## 2020-10-28 NOTE — Telephone Encounter (Signed)
Left message to return call.  Needing to know a reason to put as a diagnosis for a diagnostic mammogram. Is Patient having current breast issues?

## 2020-10-30 NOTE — Telephone Encounter (Signed)
Called and spoke with Leah [name]. Leah [name] verbalized understanding and had no further questions. She agreed to reach out to her oncologist for the diagnostic mammogram.

## 2020-10-31 ENCOUNTER — Encounter: Payer: Self-pay | Admitting: Oncology

## 2020-11-02 ENCOUNTER — Other Ambulatory Visit: Payer: Self-pay | Admitting: *Deleted

## 2020-11-02 DIAGNOSIS — Z170 Estrogen receptor positive status [ER+]: Secondary | ICD-10-CM

## 2020-11-03 ENCOUNTER — Ambulatory Visit: Payer: Medicaid Other | Admitting: Physical Therapy

## 2020-11-03 ENCOUNTER — Other Ambulatory Visit: Payer: Self-pay

## 2020-11-03 DIAGNOSIS — R2689 Other abnormalities of gait and mobility: Secondary | ICD-10-CM | POA: Diagnosis present

## 2020-11-03 DIAGNOSIS — M25561 Pain in right knee: Secondary | ICD-10-CM

## 2020-11-03 DIAGNOSIS — G8929 Other chronic pain: Secondary | ICD-10-CM | POA: Diagnosis present

## 2020-11-03 DIAGNOSIS — M533 Sacrococcygeal disorders, not elsewhere classified: Secondary | ICD-10-CM | POA: Diagnosis present

## 2020-11-03 DIAGNOSIS — M5441 Lumbago with sciatica, right side: Secondary | ICD-10-CM | POA: Diagnosis present

## 2020-11-03 DIAGNOSIS — M6281 Muscle weakness (generalized): Secondary | ICD-10-CM | POA: Diagnosis present

## 2020-11-03 DIAGNOSIS — M542 Cervicalgia: Secondary | ICD-10-CM | POA: Diagnosis present

## 2020-11-03 DIAGNOSIS — M25559 Pain in unspecified hip: Secondary | ICD-10-CM | POA: Diagnosis present

## 2020-11-03 NOTE — Therapy (Signed)
[place] MAIN [facility] SERVICES [address] [place], [place], [address] Phone: [phone]   Fax:  [phone]  Physical Therapy Treatment  Patient Details  Name: Leah [name] MRN: [id number] Date of Birth: Sep 21, 1965 Referring Provider (PT): Dr. Janese Banks   Encounter Date: 11/03/2020   PT End of Session - 11/03/20 0815     Visit Number 3    Number of Visits 10    Date for PT Re-Evaluation 12/11/20    PT Start Time 0802    PT Stop Time 0905    PT Time Calculation (min) 63 min    Activity Tolerance Patient tolerated treatment well    Behavior During Therapy [facility] for tasks assessed/performed             Past Medical History:  Diagnosis Date   Anxiety    Asthma    as a child   Breast cancer ([place])    Carpal tunnel syndrome    R hand    Colon polyps    Depression    Eosinophilic esophagitis    noted in [place] with GI path report 09/29/14    Esophageal stricture    s/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 Edwin Cap    Family history of breast cancer    Family history of prostate cancer    Herniated disc, cervical    s/p epidural injection   Herpes    History of kidney stones    Hyperlipidemia    Low back pain    Migraines    Personal history of chemotherapy    Personal history of radiation therapy     Past Surgical History:  Procedure Laterality Date   BREAST BIOPSY Left 12/25/2018   [place] BX, invasive mammary carcinoma    BREAST LUMPECTOMY     BREAST LUMPECTOMY WITH NEEDLE LOCALIZATION AND AXILLARY SENTINEL LYMPH NODE BX Left 01/16/2019   Procedure: LEFT BREAST LUMPECTOMY WITH NEEDLE LOCALIZATION AND SENTINEL LYMPH NODE BIOPSY;  Surgeon: Jovita Kussmaul, MD;  Location: [facility] ORS;  Service: General;  Laterality: Left;   CESAREAN SECTION     COLONOSCOPY WITH PROPOFOL N/A 03/27/2017   Procedure: COLONOSCOPY WITH PROPOFOL;  Surgeon: Jonathon Bellows, MD;  Location: [facility] ENDOSCOPY;  Service: Gastroenterology;   Laterality: N/A;   ESOPHAGOGASTRODUODENOSCOPY     ESOPHAGOGASTRODUODENOSCOPY (EGD) WITH PROPOFOL N/A 03/27/2017   Procedure: ESOPHAGOGASTRODUODENOSCOPY (EGD) WITH PROPOFOL WITH DILATION;  Surgeon: Jonathon Bellows, MD;  Location: [facility] ENDOSCOPY;  Service: Gastroenterology;  Laterality: N/A;   PORTACATH PLACEMENT N/A 02/15/2019   Procedure: INSERTION PORT-A-CATH WITH POSSIBLE ULTRASOUND;  Surgeon: Jovita Kussmaul, MD;  Location: [facility] ORS;  Service: General;  Laterality: N/A;   RE-EXCISION OF BREAST CANCER,SUPERIOR MARGINS Left 02/15/2019   Procedure: RE-EXCISION OF LEFT BREAST CANCER ANTERIOR MARGINS;  Surgeon: Jovita Kussmaul, MD;  Location: [facility] ORS;  Service: General;  Laterality: Left;   THROAT SURGERY  123456   UMBILICAL HERNIA REPAIR  2006    x 2   w mesh per pt    There were no vitals filed for this visit.   Subjective Assessment - 11/03/20 0815     Subjective Pt reported L hip pain decreased from 6-7/10 to 3/10 after the wearing shoe lift all week    Pertinent History C section, umbilical hernia  repair x 2, lumpectectomy on L 2020 for Stage I Breast Cancer, Denied falls onto tailbone but she had 2 falls within the last 6 months with landed on R knee  during these 2 falls. Underwent chemo and radiation -finished in May and August 2021.    Patient Stated Goals Walk up and down stairs without cringing.     Pain Onset More than a month ago                [facility] PT Assessment - 11/03/20 0851       Palpation   Spinal mobility tightness intercostal anterior. lateral posterior T7-10, L medial/ posetrior border of scapula L      Ambulation/Gait   Gait Comments L trunk lean, collapsed L flank area                           [facility] Adult PT Treatment/Exercise - 11/03/20 [id number]       Neuro Re-ed    Neuro Re-ed Details  cued for HEP      Modalities   Modalities Moist Heat      Moist Heat Therapy   Number Minutes Moist Heat 5 Minutes    Moist Heat Location --   L thoracic/  flank     Manual Therapy   Manual therapy comments STM/MWM, jostling,  at problem areas noted in assessment, Grade PA mob at spinous process T7-10                         PT Long Term Goals - 10/02/20 0826       PT LONG TERM GOAL #1   Title Pt will notice less LBP by 75% improvement and be able to walk at her next job without LBP across one day,    Time 6    Period Weeks    Status New    Target Date 11/13/20      PT LONG TERM GOAL #2   Title Pt's FOTO pelvic pain will improve by    Time 10    Period Weeks    Status New    Target Date 12/11/20      PT LONG TERM GOAL #3   Title Pt will demo increased hip abduction strength from  to    in order to wealk her dogs which weigh ( 80 lbs, 60lbs)    Time 8    Period Weeks    Status New    Target Date 11/27/20      PT LONG TERM GOAL #4   Title Pt willl report less radiating pain down to first 3 digits of B hands by 50% in fequency and intensity so she can grab items for ADLs    Time 10    Period Weeks    Status New    Target Date 12/11/20      PT LONG TERM GOAL #5   Title Pt will improve BLE strength in order to minimize falls and walk with less pain    Baseline , R DF/EV 4-/5, L 5/5. PF with single UE :  B 10 reps, R hip abd 3/5, L 3+/5    Time 8    Period Weeks    Status New    Target Date 11/27/20      Additional Long Term Goals   Additional Long Term Goals --      PT LONG TERM GOAL #6   Title Pt will demo no more higher R shoulder/ R hip inorder to demo reciporcal gait pattern to minimize radiating LBP    Time 2    Period Weeks  Status New    Target Date 10/16/20      PT LONG TERM GOAL #7   Title Pt will demo decreased abdominal bulge at linea alba in order to demo improved IAP system for LBP, hip pain, and pelvic pain    Time 8    Period Weeks    Status New    Target Date 11/27/20                   Plan - 11/03/20 0851     Clinical Impression Statement Pt required more manual Tx  to lengthn L flank area and increased intercostal space between T7-10 and mobility at L scapula. Shoe lift has been removed as pelvic alignment is present. Anticipate more manual Tx to spine and strengthening HEP will help with L trunk lean.  Plan to address R elevated shoulder next session. Progress to pelvic floor and dee[p core coordination at upcoming sessions as shoulder alignment improves with less spinal deviations. Pt continues to benefit from skilled PT.    Examination-Activity Limitations Stairs;Other;Locomotion Level;Stand;Lift    Stability/Clinical Decision Making Evolving/Moderate complexity    Rehab Potential Good    PT Frequency 1x / week    PT Duration Other (comment)   10   PT Treatment/Interventions Manual techniques;Therapeutic exercise;Therapeutic activities;Gait training;Neuromuscular re-education;Patient/family education    Consulted and Agree with Plan of Care Patient             Patient will benefit from skilled therapeutic intervention in order to improve the following deficits and impairments:  Pain, Postural dysfunction, Improper body mechanics, Decreased strength, Difficulty walking, Decreased mobility, Decreased coordination, Decreased balance, Abnormal gait, Increased fascial restricitons, Increased muscle spasms, Decreased scar mobility, Decreased range of motion, Decreased endurance  Visit Diagnosis: Other abnormalities of gait and mobility  Sacrococcygeal disorders, not elsewhere classified  Muscle weakness (generalized)  Chronic right-sided low back pain with right-sided sciatica  Neck pain, chronic  Pain in joint involving pelvic region and thigh, unspecified laterality  Chronic pain of right knee     Problem List Patient Active Problem List   Diagnosis Date Noted   Cognitive changes 07/03/2020   Annual physical exam 03/19/2020   B12 deficiency 07/19/2019   COVID-19 virus infection 03/13/2019   BMI 29.0-29.9,adult 03/03/2019   Genetic  testing 01/29/2019   Family history of breast cancer    Family history of prostate cancer    Goals of care, counseling/discussion 01/03/2019   Malignant neoplasm of left female breast ([place]) 12/28/2018   Abnormal MRI of head 12/11/2018   Dizziness 12/11/2018   Chronic pain 12/11/2018   Vitamin D deficiency 12/11/2018   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 08/21/2018   Insomnia 03/23/2018   Allergic rhinitis 01/03/2018   Anxiety 01/03/2018   Cervical radiculopathy 10/03/2017   Tinea corporis 10/03/2017   Hyperlipidemia 04/04/2017   Breast cyst, left 04/04/2017   Aortic atherosclerosis ([place]) 04/04/2017   Anxiety and depression 03/08/2017   Dysphagia 03/08/2017   Hip pain-right 03/08/2017   Low back pain 03/08/2017   Flank pain 10/18/2016   Asthma 06/01/2016    Jerl Mina ,PT, DPT, E-RYT  11/03/2020, 9:05 AM   [place] [facility] [name] [place], [place], [address] Phone: [phone]   Fax:  [phone]  Name: Leah [name] MRN: [id number] Date of Birth: 03/30/1965

## 2020-11-03 NOTE — Patient Instructions (Addendum)
Remove the heel lift  _  Do the winging and open book on both sides   __ Wall lean for scoliosis   "tip the teapot"   Forearm slide against wall as you stand perpendicular to wall,feet hip width apart,  opposite hand pulls band without letting elbow move away from side body  And slide forearm against the door up and down  Tilt trunk away from door but not away from hip   Make sure the upper trapezius muscle does not hike up to ear as band is being pulled as you lean towards wall    band  30 reps   Leaning _R  only to open the L  flank area    __  Walk with feet under hips,  Stand with feet hip width apart  __   Avoid straining pelvic floor, abdominal muscles , spine  Use log rolling technique instead of getting out of bed with your neck or the sit-up     Log rolling into and out of bed   Log rolling into and out of bed If getting out of bed on R side, Bent knees, scoot hips/ shoulder to L  Raise R arm completely overhead, rolling onto armpit  Then lower bent knees to bed to get into complete side lying position  Then drop legs off bed, and push up onto R elbow/forearm, and use L hand to push onto the bed

## 2020-11-06 ENCOUNTER — Encounter: Payer: Medicaid Other | Admitting: Physical Therapy

## 2020-11-10 ENCOUNTER — Ambulatory Visit: Payer: Medicaid Other | Admitting: Physical Therapy

## 2020-11-11 ENCOUNTER — Telehealth: Payer: Self-pay

## 2020-11-11 ENCOUNTER — Encounter: Payer: Medicaid Other | Admitting: Physical Therapy

## 2020-11-11 DIAGNOSIS — C50212 Malignant neoplasm of upper-inner quadrant of left female breast: Secondary | ICD-10-CM

## 2020-11-11 DIAGNOSIS — Z170 Estrogen receptor positive status [ER+]: Secondary | ICD-10-CM

## 2020-11-11 NOTE — Telephone Encounter (Signed)
Patient would like to discharge from CARE at this time; states too much with family and cannot take time off of work. Cannot commit to program at this time. Informed patient when she is ready to come back to let her provider know to send a new referral. Will discharge at this time.

## 2020-11-11 NOTE — Progress Notes (Signed)
CARE Discharge Progress Report  Patient Details  Name: Leah [name] MRN: [id number] Date of Birth: 05-04-1965 Referring Provider:   April [facility]on [facility] & Exercise from 09/22/2020 in [facility]  Referring Provider Randa Evens MD        Number of Visits: 3  Reason for Discharge:  Early Exit:  Personal  Smoking History:  Social History   Tobacco Use  Smoking Status Former   Packs/day: 1.00   Years: 20.00   Pack years: 20.00   Types: Cigarettes   Quit date: 06/02/2006   Years since quitting: 14.4  Smokeless Tobacco Never    Diagnosis:  Malignant neoplasm of upper-inner quadrant of left breast in female, estrogen receptor positive ([place])  ADL UCSD:   Initial Exercise Prescription:  Initial Exercise Prescription - 09/22/20 1400       Date of Initial Exercise RX and Referring Provider   Date 09/22/20    Referring Provider Randa Evens MD      Treadmill   MPH 2.5    Grade 0    Minutes 15    METs 2.91      Recumbant Bike   Level 2    RPM 60    Minutes 15    METs 3.8      NuStep   Level 3    SPM 80    Minutes 15    METs 3.8      REL-XR   Level 2    Speed 50    Minutes 15    METs 3.8      Prescription Details   Frequency (times per week) 2    Duration Progress to 30 minutes of continuous aerobic without signs/symptoms of physical distress      Intensity   THRR 40-80% of Max Heartrate 117-151    Ratings of Perceived Exertion 11-13    Perceived Dyspnea 0-4      Progression   Progression Continue to progress workloads to maintain intensity without signs/symptoms of physical distress.      Resistance Training   Training Prescription Yes    Weight 3 lb    Reps 10-15             Discharge Exercise Prescription (Final Exercise Prescription Changes):  Exercise Prescription Changes - 09/22/20 1400       Response to Exercise   Blood Pressure (Admit) 132/78    Blood Pressure (Exercise)  144/74    Blood Pressure (Exit) 140/76    Heart Rate (Admit) 83 bpm    Heart Rate (Exercise) 105 bpm    Heart Rate (Exit) 85 bpm    Oxygen Saturation (Admit) 96 %    Oxygen Saturation (Exercise) 94 %    Oxygen Saturation (Exit) 96 %    Rating of Perceived Exertion (Exercise) 11    Perceived Dyspnea (Exercise) 1    Symptoms R. hip pain 6/10    Comments walk test results             Functional Capacity:  6 Minute Walk     Row Name 09/22/20 1408         6 Minute Walk   Phase Initial     Distance 1300 feet     Walk Time 6 minutes     # of Rest Breaks 0     MPH 2.46     METS 3.88     RPE 11     Perceived Dyspnea  1  VO2 Peak 13.59     Symptoms Yes (comment)     Comments R. hip pain 6/10     Resting HR 83 bpm     Resting BP 132/78     Resting Oxygen Saturation  96 %     Exercise Oxygen Saturation  during 6 min walk 94 %     Max Ex. HR 105 bpm     Max Ex. BP 144/74     2 Minute Post BP 140/76               Nutrition & Weight - Outcomes:  Pre Biometrics - 09/22/20 1410       Pre Biometrics   Height 5' 4.5" (1.638 m)    Weight 173 lb 11.2 oz (78.8 kg)    BMI (Calculated) 29.37              Goals reviewed with patient; copy given to patient.

## 2020-11-13 ENCOUNTER — Encounter: Payer: Medicaid Other | Admitting: Physical Therapy

## 2020-11-14 ENCOUNTER — Other Ambulatory Visit: Payer: Self-pay | Admitting: Oncology

## 2020-11-15 ENCOUNTER — Encounter: Payer: Self-pay | Admitting: Oncology

## 2020-11-17 ENCOUNTER — Other Ambulatory Visit: Payer: Self-pay

## 2020-11-17 ENCOUNTER — Ambulatory Visit: Payer: Medicaid Other | Attending: Oncology | Admitting: Physical Therapy

## 2020-11-17 DIAGNOSIS — M25521 Pain in right elbow: Secondary | ICD-10-CM | POA: Insufficient documentation

## 2020-11-17 DIAGNOSIS — M25561 Pain in right knee: Secondary | ICD-10-CM | POA: Insufficient documentation

## 2020-11-17 DIAGNOSIS — M25559 Pain in unspecified hip: Secondary | ICD-10-CM | POA: Insufficient documentation

## 2020-11-17 DIAGNOSIS — M25631 Stiffness of right wrist, not elsewhere classified: Secondary | ICD-10-CM | POA: Insufficient documentation

## 2020-11-17 DIAGNOSIS — M5441 Lumbago with sciatica, right side: Secondary | ICD-10-CM | POA: Insufficient documentation

## 2020-11-17 DIAGNOSIS — G8929 Other chronic pain: Secondary | ICD-10-CM | POA: Insufficient documentation

## 2020-11-17 DIAGNOSIS — R2689 Other abnormalities of gait and mobility: Secondary | ICD-10-CM | POA: Diagnosis not present

## 2020-11-17 DIAGNOSIS — M79644 Pain in right finger(s): Secondary | ICD-10-CM | POA: Diagnosis not present

## 2020-11-17 DIAGNOSIS — M25522 Pain in left elbow: Secondary | ICD-10-CM | POA: Insufficient documentation

## 2020-11-17 DIAGNOSIS — M6281 Muscle weakness (generalized): Secondary | ICD-10-CM | POA: Diagnosis not present

## 2020-11-17 DIAGNOSIS — M79645 Pain in left finger(s): Secondary | ICD-10-CM | POA: Diagnosis not present

## 2020-11-17 DIAGNOSIS — M542 Cervicalgia: Secondary | ICD-10-CM | POA: Insufficient documentation

## 2020-11-17 DIAGNOSIS — M533 Sacrococcygeal disorders, not elsewhere classified: Secondary | ICD-10-CM | POA: Insufficient documentation

## 2020-11-17 DIAGNOSIS — M25641 Stiffness of right hand, not elsewhere classified: Secondary | ICD-10-CM | POA: Diagnosis not present

## 2020-11-19 ENCOUNTER — Ambulatory Visit: Payer: Medicaid Other | Admitting: Occupational Therapy

## 2020-11-19 DIAGNOSIS — M25561 Pain in right knee: Secondary | ICD-10-CM | POA: Diagnosis not present

## 2020-11-19 DIAGNOSIS — M25641 Stiffness of right hand, not elsewhere classified: Secondary | ICD-10-CM | POA: Diagnosis not present

## 2020-11-19 DIAGNOSIS — M5441 Lumbago with sciatica, right side: Secondary | ICD-10-CM | POA: Diagnosis not present

## 2020-11-19 DIAGNOSIS — M79644 Pain in right finger(s): Secondary | ICD-10-CM

## 2020-11-19 DIAGNOSIS — G8929 Other chronic pain: Secondary | ICD-10-CM | POA: Diagnosis not present

## 2020-11-19 DIAGNOSIS — M25631 Stiffness of right wrist, not elsewhere classified: Secondary | ICD-10-CM

## 2020-11-19 DIAGNOSIS — M25521 Pain in right elbow: Secondary | ICD-10-CM | POA: Diagnosis not present

## 2020-11-19 DIAGNOSIS — M542 Cervicalgia: Secondary | ICD-10-CM | POA: Diagnosis not present

## 2020-11-19 DIAGNOSIS — M25522 Pain in left elbow: Secondary | ICD-10-CM | POA: Diagnosis not present

## 2020-11-19 DIAGNOSIS — M533 Sacrococcygeal disorders, not elsewhere classified: Secondary | ICD-10-CM | POA: Diagnosis not present

## 2020-11-19 DIAGNOSIS — R2689 Other abnormalities of gait and mobility: Secondary | ICD-10-CM | POA: Diagnosis not present

## 2020-11-19 DIAGNOSIS — M6281 Muscle weakness (generalized): Secondary | ICD-10-CM

## 2020-11-19 DIAGNOSIS — M79645 Pain in left finger(s): Secondary | ICD-10-CM | POA: Diagnosis not present

## 2020-11-19 DIAGNOSIS — M25559 Pain in unspecified hip: Secondary | ICD-10-CM | POA: Diagnosis not present

## 2020-11-19 NOTE — Therapy (Signed)
[facility] [address], [place], [address] Phone: [phone]   Fax:  [phone]  Occupational Therapy Evaluation  Patient Details  Name: Leah [name] MRN: [id number] Date of Birth: Jan 01, 1966 Referring Provider (OT): Dr Janese Banks   Encounter Date: 11/19/2020   OT End of Session - 11/19/20 1051     Visit Number 1    Number of Visits 6    Date for OT Re-Evaluation 12/31/20    OT Start Time 0815    OT Stop Time 0900    OT Time Calculation (min) 45 min    Activity Tolerance Patient tolerated treatment well    Behavior During Therapy [facility] for tasks assessed/performed             Past Medical History:  Diagnosis Date   Anxiety    Asthma    as a child   Breast cancer ([place])    Carpal tunnel syndrome    R hand    Colon polyps    Depression    Eosinophilic esophagitis    noted in [place] with GI path report 09/29/14    Esophageal stricture    s/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 Edwin Cap    Family history of breast cancer    Family history of prostate cancer    Herniated disc, cervical    s/p epidural injection   Herpes    History of kidney stones    Hyperlipidemia    Low back pain    Migraines    Personal history of chemotherapy    Personal history of radiation therapy     Past Surgical History:  Procedure Laterality Date   BREAST BIOPSY Left 12/25/2018   [place] BX, invasive mammary carcinoma    BREAST LUMPECTOMY     BREAST LUMPECTOMY WITH NEEDLE LOCALIZATION AND AXILLARY SENTINEL LYMPH NODE BX Left 01/16/2019   Procedure: LEFT BREAST LUMPECTOMY WITH NEEDLE LOCALIZATION AND SENTINEL LYMPH NODE BIOPSY;  Surgeon: Jovita Kussmaul, MD;  Location: [facility] ORS;  Service: General;  Laterality: Left;   CESAREAN SECTION     COLONOSCOPY WITH PROPOFOL N/A 03/27/2017   Procedure: COLONOSCOPY WITH PROPOFOL;  Surgeon: Jonathon Bellows, MD;  Location: [facility] ENDOSCOPY;  Service:  Gastroenterology;  Laterality: N/A;   ESOPHAGOGASTRODUODENOSCOPY     ESOPHAGOGASTRODUODENOSCOPY (EGD) WITH PROPOFOL N/A 03/27/2017   Procedure: ESOPHAGOGASTRODUODENOSCOPY (EGD) WITH PROPOFOL WITH DILATION;  Surgeon: Jonathon Bellows, MD;  Location: [facility] ENDOSCOPY;  Service: Gastroenterology;  Laterality: N/A;   PORTACATH PLACEMENT N/A 02/15/2019   Procedure: INSERTION PORT-A-CATH WITH POSSIBLE ULTRASOUND;  Surgeon: Jovita Kussmaul, MD;  Location: [facility] ORS;  Service: General;  Laterality: N/A;   RE-EXCISION OF BREAST CANCER,SUPERIOR MARGINS Left 02/15/2019   Procedure: RE-EXCISION OF LEFT BREAST CANCER ANTERIOR MARGINS;  Surgeon: Jovita Kussmaul, MD;  Location: [facility] ORS;  Service: General;  Laterality: Left;   THROAT SURGERY  5681   UMBILICAL HERNIA REPAIR  2006    x 2   w mesh per pt    There were no vitals filed for this visit.   Subjective Assessment - 11/19/20 0858     Subjective  Have numbness more in the R hand than L , and at night time increase during day and elbow pain still in the R more than the Left    Pertinent History Patient is a 55 y.o. 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  She is s/p lumpectomy with high risk MammaPrint score.  She is s/p 4 cycles of dose dense AC and 12 cycles of weekly Taxol chemotherapy.   She is here for routine follow-up of breast cancer     Clinically patient is doing well with no concern in signs and symptoms of recurrence based on today's exam.  She will continue to take Arimidex.  She was due for Zometa today.  Calcium levels are normal and acceptable to proceed with Zometa today I will see her back in 6 months with CBC with differential CMP for next dose of Zometa.  Her mammogram would be due in September 2022 which would be ordered by Dr. Marlou Starks. Refer for OT screen on 08/26/20  by [facility] for bilateral arm pain ,  numbness and risk for lymphedema - request by pt to be seen by OT for tx    Patient Stated Goals Want to get the pain and numbness in my hands better    Currently in Pain? Yes    Pain Score 4     Pain Location Elbow   hand   Pain Orientation Right;Left    Pain Descriptors / Indicators Aching;Tender;Numbness;Pins and needles    Pain Type Acute pain    Pain Onset More than a month ago    Pain Frequency Intermittent               [facility] OT Assessment - 11/19/20 0001       Assessment   Medical Diagnosis Bil CT and med epicondylitis    Referring Provider (OT) Dr Janese Banks    Onset Date/Surgical Date 08/12/20    Hand Dominance Right      Home  Environment   Lives With --   Son lives with her     Prior Function   Vocation Full time employment    Leisure Work at [employer] , dogs      AROM   Right Wrist Extension 70 Degrees    Right Wrist Flexion 90 Degrees    Left Wrist Extension 85 Degrees    Left Wrist Flexion 95 Degrees      Strength   Right Hand Grip (lbs) 55    Right Hand Lateral Pinch 13 lbs    Right Hand 3 Point Pinch 13 lbs    Left Hand Grip (lbs) 47    Left Hand Lateral Pinch 11 lbs    Left Hand 3 Point Pinch 10 lbs      Right Hand AROM   R Thumb Palmar ABduction/ADduction 0-45 48      Left Hand AROM   L Thumb Palmar ADduction/ABduction 0-45 57                      OT Treatments/Exercises (OP) - 11/19/20 0001       RUE Contrast Bath   Time 8 minutes    Comments CT , forearm   prior to soft tissue and ROM     LUE Contrast Bath   Time 8 minutes    Comments CT and forearm   prior to soft tissue and ROM             REview with pt HEP for contrast 2 x day  Soft tissue for CT spreads and volar forearms and wrist prior to ROM  Prayer stretch lightly - 10 x  Tendon glides 10 x  Med N glide 5  reps   Wrist splints most all the time - off for HEP and ADL [facility] neoprene splint for R thumb - to use at times- -but mostly now wrist  splints       OT Education - 11/19/20 1051     Education Details findings of eval and HEP    Person(s) Educated Patient    Methods Explanation;Demonstration;Tactile cues;Verbal cues;Handout    Comprehension Verbal cues required;Returned demonstration;Verbalized understanding              OT Short Term Goals - 11/19/20 1100       OT SHORT TERM GOAL #1   Title Pt to be ind in HEP to increase AROM and decrease stiffness and pain in bilateral handsl, wrist and elbows    Baseline pain 1-7/10 in hands and elbows- medial epicondyle - tendernes- decrease R thumb , wrist AROM    Time 3    Period Weeks    Status New    Target Date 12/10/20               OT Long Term Goals - 11/19/20 1101       OT LONG TERM GOAL #1   Title Pt show decrease pain less than 2/10 in the hand and elbow on bilateral hands with use at work and in the am    Baseline pain 1-7/10 thumbs, wrist and elbows -R worse than L    Time 3    Period Weeks    Status New    Target Date 12/10/20      OT LONG TERM GOAL #2   Title Pt show AROM in bilateral hands and wrist to WNL to use with more ease in ADL's and iADl's    Baseline decrease R thumb PA 48, L 57; wrist ext and flexionR decrease compare to L -    Time 4    Period Weeks    Status New    Target Date 12/17/20      OT LONG TERM GOAL #3   Title Pt show decrease numbness and pain in bilateral hands - to less than 3 episodes at week    Baseline numbness in the am and as the days goes - pain 2-4 /10 constant in R elbow and bilateral thumb- increase in bilateral hands and elbows as the day goes    Time 6    Period Weeks    Status New    Target Date 12/31/20                   Plan - 11/19/20 1052     Clinical Impression Statement Pt present at OT eval with bilateral CTS and medial epicondyle - R worse than the L and R hand worse than the elbow,  pain  2/10 at the elbow,  4/10 at he hand  and increase to 7/10 as the day goes- pt with  decrease AROM in R thumb and wrist with increase tightness over volar forearm and wrist - as well as hand -Positive Tinel and Phalens in bilateral CT, tenderness and pain in R more than L medial epicondyle.  Pt fitted with bilateral wrist splints to wear most all the time - off for HEP - do contrast , soft tissue and ROM - modify how she grasp and lift /carry objects- pt grip and prehension strength WFL but lower range for her age - numbness most of the time in hands- all limiting her funtional use of bilateral hands in ADL's  and IADL's- pt can benefit from skilled OT services    OT Occupational Profile and History Problem Focused Assessment - Including review of records relating to presenting problem    Occupational performance deficits (Please refer to evaluation for details): ADL's;IADL's;Rest and Sleep;Work;Leisure;Play    Body Structure / Function / Physical Skills ADL;Strength;Pain;UE functional use;ROM;Flexibility;Sensation;Decreased knowledge of precautions    Rehab Potential Fair    Clinical Decision Making Limited treatment options, no task modification necessary    Comorbidities Affecting Occupational Performance: None    Modification or Assistance to Complete Evaluation  No modification of tasks or assist necessary to complete eval    OT Frequency 1x / week    OT Duration 6 weeks    OT Treatment/Interventions Self-care/ADL training;DME and/or AE instruction;Contrast Bath;Splinting;Therapeutic exercise;Ultrasound;Iontophoresis;Manual Therapy;Patient/family education;Passive range of motion    Consulted and Agree with Plan of Care Patient             Patient will benefit from skilled therapeutic intervention in order to improve the following deficits and impairments:   Body Structure / Function / Physical Skills: ADL, Strength, Pain, UE functional use, ROM, Flexibility, Sensation, Decreased knowledge of precautions       Visit Diagnosis: Muscle weakness (generalized) - Plan: Ot  plan of care cert/re-cert  Stiffness of right hand, not elsewhere classified - Plan: Ot plan of care cert/re-cert  Stiffness of right wrist, not elsewhere classified - Plan: Ot plan of care cert/re-cert  Pain of both elbows - Plan: Ot plan of care cert/re-cert  Pain of both thumbs - Plan: Ot plan of care cert/re-cert    Problem List Patient Active Problem List   Diagnosis Date Noted   Cognitive changes 07/03/2020   Annual physical exam 03/19/2020   B12 deficiency 07/19/2019   COVID-19 virus infection 03/13/2019   BMI 29.0-29.9,adult 03/03/2019   Genetic testing 01/29/2019   Family history of breast cancer    Family history of prostate cancer    Goals of care, counseling/discussion 01/03/2019   Malignant neoplasm of left female breast ([place]) 12/28/2018   Abnormal MRI of head 12/11/2018   Dizziness 12/11/2018   Chronic pain 12/11/2018   Vitamin D deficiency 12/11/2018   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 08/21/2018   Insomnia 03/23/2018   Allergic rhinitis 01/03/2018   Anxiety 01/03/2018   Cervical radiculopathy 10/03/2017   Tinea corporis 10/03/2017   Hyperlipidemia 04/04/2017   Breast cyst, left 04/04/2017   Aortic atherosclerosis ([place]) 04/04/2017   Anxiety and depression 03/08/2017   Dysphagia 03/08/2017   Hip pain-right 03/08/2017   Low back pain 03/08/2017   Flank pain 10/18/2016   Asthma 06/01/2016    Rosalyn Gess, OTR/L, CLT 11/19/2020, 11:08 AM  [facility] [address], [place], [address] Phone: [phone]   Fax:  [phone]  Name: Leah [name] MRN: [id number] Date of Birth: 1965/04/24

## 2020-11-20 ENCOUNTER — Ambulatory Visit: Payer: Medicaid Other | Admitting: Radiation Oncology

## 2020-11-20 ENCOUNTER — Ambulatory Visit: Payer: Medicaid Other

## 2020-11-20 ENCOUNTER — Ambulatory Visit: Payer: Medicaid Other | Admitting: Oncology

## 2020-11-20 ENCOUNTER — Other Ambulatory Visit: Payer: Medicaid Other

## 2020-11-20 ENCOUNTER — Encounter: Payer: Medicaid Other | Admitting: Physical Therapy

## 2020-11-20 NOTE — Therapy (Addendum)
[facility] SERVICES [address] [place], [place], [address] Phone: [phone]   Fax:  [phone]  Physical Therapy Treatment  Patient Details  Name: Leah [name] MRN: [id number] Date of Birth: 24-Dec-1965 Referring Provider (PT): Dr. Janese Banks   Encounter Date: 11/17/2020   PT End of Session - 11/20/20 1144     Visit Number 4    Number of Visits 10    Date for PT Re-Evaluation 12/11/20    PT Start Time 0804    PT Stop Time 0900    PT Time Calculation (min) 56 min    Activity Tolerance Patient tolerated treatment well    Behavior During Therapy [facility] for tasks assessed/performed             Past Medical History:  Diagnosis Date   Anxiety    Asthma    as a child   Breast cancer ([place])    Carpal tunnel syndrome    R hand    Colon polyps    Depression    Eosinophilic esophagitis    noted in [place] with GI path report 09/29/14    Esophageal stricture    s/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 Edwin Cap    Family history of breast cancer    Family history of prostate cancer    Herniated disc, cervical    s/p epidural injection   Herpes    History of kidney stones    Hyperlipidemia    Low back pain    Migraines    Personal history of chemotherapy    Personal history of radiation therapy     Past Surgical History:  Procedure Laterality Date   BREAST BIOPSY Left 12/25/2018   [place] BX, invasive mammary carcinoma    BREAST LUMPECTOMY     BREAST LUMPECTOMY WITH NEEDLE LOCALIZATION AND AXILLARY SENTINEL LYMPH NODE BX Left 01/16/2019   Procedure: LEFT BREAST LUMPECTOMY WITH NEEDLE LOCALIZATION AND SENTINEL LYMPH NODE BIOPSY;  Surgeon: Jovita Kussmaul, MD;  Location: [facility] ORS;  Service: General;  Laterality: Left;   CESAREAN SECTION     COLONOSCOPY WITH PROPOFOL N/A 03/27/2017   Procedure: COLONOSCOPY WITH PROPOFOL;  Surgeon: Jonathon Bellows, MD;  Location: [facility] ENDOSCOPY;  Service: Gastroenterology;   Laterality: N/A;   ESOPHAGOGASTRODUODENOSCOPY     ESOPHAGOGASTRODUODENOSCOPY (EGD) WITH PROPOFOL N/A 03/27/2017   Procedure: ESOPHAGOGASTRODUODENOSCOPY (EGD) WITH PROPOFOL WITH DILATION;  Surgeon: Jonathon Bellows, MD;  Location: [facility] ENDOSCOPY;  Service: Gastroenterology;  Laterality: N/A;   PORTACATH PLACEMENT N/A 02/15/2019   Procedure: INSERTION PORT-A-CATH WITH POSSIBLE ULTRASOUND;  Surgeon: Jovita Kussmaul, MD;  Location: [facility] ORS;  Service: General;  Laterality: N/A;   RE-EXCISION OF BREAST CANCER,SUPERIOR MARGINS Left 02/15/2019   Procedure: RE-EXCISION OF LEFT BREAST CANCER ANTERIOR MARGINS;  Surgeon: Jovita Kussmaul, MD;  Location: [facility] ORS;  Service: General;  Laterality: Left;   THROAT SURGERY  123456   UMBILICAL HERNIA REPAIR  2006    x 2   w mesh per pt    There were no vitals filed for this visit.   Subjective Assessment - 11/20/20 1220     Subjective Pt reported L hip pain decreased from 6-7/10 to 3/10 after the wearing shoe lift all week    Pertinent History C section, umbilical hernia  repair x 2, lumpectectomy on L 2020 for Stage I Breast Cancer, Denied falls onto tailbone but she had 2 falls within the last 6 months with landed on R knee  during these 2 falls. Underwent chemo and radiation -finished in May and August 2021.    Patient Stated Goals Walk up and down stairs without cringing.     Pain Onset More than a month ago                [facility] PT Assessment - 11/20/20 1146       Palpation   Spinal mobility tightness along intercostals L T5-12 , medial/ inferior attachments to scapula      Ambulation/Gait   Gait Comments less L trunk lean , more reciporcal pelvic movement                           [facility] Adult PT Treatment/Exercise - 11/20/20 1146       Manual Therapy   Manual therapy comments STM/MWM at probelm areas noed in assessment                          PT Long Term Goals - 11/20/20 1221       PT LONG TERM GOAL #1    Title Pt will notice less LBP by 75% improvement and be able to walk at her next job without LBP across one day,    Time 6    Period Weeks    Status New      PT LONG TERM GOAL #2   Title Pt's FOTO pelvic pain will improve by    Time 10    Period Weeks    Status New      PT LONG TERM GOAL #3   Title Pt will demo increased hip abduction strength from  to    in order to wealk her dogs which weigh ( 80 lbs, 60lbs)    Time 8    Period Weeks    Status New      PT LONG TERM GOAL #4   Title Pt willl report less radiating pain down to first 3 digits of B hands by 50% in fequency and intensity so she can grab items for ADLs    Time 10    Period Weeks    Status New      PT LONG TERM GOAL #5   Title Pt will improve BLE strength in order to minimize falls and walk with less pain    Baseline , R DF/EV 4-/5, L 5/5. PF with single UE :  B 10 reps, R hip abd 3/5, L 3+/5    Time 8    Period Weeks    Status New      PT LONG TERM GOAL #6   Title Pt will demo no more higher R shoulder/ R hip inorder to demo reciporcal gait pattern to minimize radiating LBP    Time 2    Period Weeks    Status New      PT LONG TERM GOAL #7   Title Pt will demo decreased abdominal bulge at linea alba in order to demo improved IAP system for LBP, hip pain, and pelvic pain    Time 8    Period Weeks    Status New                   Plan - 11/20/20 1144     Clinical Impression Statement Pt required more manual Tx to minimize tightness at L upper quadrant to address uneven shoulder height. Pt demo'd improved mobility but will require  more strengthening exercises to lengthen the L flank area. Pelvic alignment remains levelled across the past sessions. Plan to introduce deep core next session and L sided lengthening/ strengthening exercises at next session.    Examination-Activity Limitations Stairs;Other;Locomotion Level;Stand;Lift    Stability/Clinical Decision Making Evolving/Moderate complexity    Rehab  Potential Good    PT Frequency 1x / week    PT Duration Other (comment)   10   PT Treatment/Interventions Manual techniques;Therapeutic exercise;Therapeutic activities;Gait training;Neuromuscular re-education;Patient/family education    Consulted and Agree with Plan of Care Patient             Patient will benefit from skilled therapeutic intervention in order to improve the following deficits and impairments:  Pain, Postural dysfunction, Improper body mechanics, Decreased strength, Difficulty walking, Decreased mobility, Decreased coordination, Decreased balance, Abnormal gait, Increased fascial restricitons, Increased muscle spasms, Decreased scar mobility, Decreased range of motion, Decreased endurance  Visit Diagnosis: Sacrococcygeal disorders, not elsewhere classified  Other abnormalities of gait and mobility  Muscle weakness (generalized)  Chronic right-sided low back pain with right-sided sciatica  Neck pain, chronic  Pain in joint involving pelvic region and thigh, unspecified laterality  Chronic pain of right knee     Problem List Patient Active Problem List   Diagnosis Date Noted   Cognitive changes 07/03/2020   Annual physical exam 03/19/2020   B12 deficiency 07/19/2019   COVID-19 virus infection 03/13/2019   BMI 29.0-29.9,adult 03/03/2019   Genetic testing 01/29/2019   Family history of breast cancer    Family history of prostate cancer    Goals of care, counseling/discussion 01/03/2019   Malignant neoplasm of left female breast ([place]) 12/28/2018   Abnormal MRI of head 12/11/2018   Dizziness 12/11/2018   Chronic pain 12/11/2018   Vitamin D deficiency 12/11/2018   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 08/21/2018   Insomnia 03/23/2018   Allergic rhinitis 01/03/2018   Anxiety 01/03/2018   Cervical radiculopathy 10/03/2017   Tinea corporis 10/03/2017   Hyperlipidemia 04/04/2017   Breast cyst, left 04/04/2017   Aortic atherosclerosis ([place]) 04/04/2017    Anxiety and depression 03/08/2017   Dysphagia 03/08/2017   Hip pain-right 03/08/2017   Low back pain 03/08/2017   Flank pain 10/18/2016   Asthma 06/01/2016    Jerl Mina, PT 11/20/2020, 12:21 PM  [facility] MAIN [facility] SERVICES [address] [place], [place], [address] Phone: [phone]   Fax:  [phone]  Name: Leah [name] MRN: [id number] Date of Birth: 07/20/1965

## 2020-11-23 ENCOUNTER — Other Ambulatory Visit: Payer: Self-pay

## 2020-11-23 ENCOUNTER — Ambulatory Visit: Payer: Medicaid Other | Admitting: Physical Therapy

## 2020-11-23 ENCOUNTER — Ambulatory Visit
Admission: RE | Admit: 2020-11-23 | Discharge: 2020-11-23 | Disposition: A | Discharge status: Home / Self Care | Payer: Medicaid Other | Source: Ambulatory Visit | Attending: Oncology | Admitting: Oncology

## 2020-11-23 DIAGNOSIS — M542 Cervicalgia: Secondary | ICD-10-CM

## 2020-11-23 DIAGNOSIS — M5441 Lumbago with sciatica, right side: Secondary | ICD-10-CM

## 2020-11-23 DIAGNOSIS — Z170 Estrogen receptor positive status [ER+]: Secondary | ICD-10-CM | POA: Insufficient documentation

## 2020-11-23 DIAGNOSIS — C50212 Malignant neoplasm of upper-inner quadrant of left female breast: Secondary | ICD-10-CM

## 2020-11-23 DIAGNOSIS — M6281 Muscle weakness (generalized): Secondary | ICD-10-CM

## 2020-11-23 DIAGNOSIS — G8929 Other chronic pain: Secondary | ICD-10-CM

## 2020-11-23 DIAGNOSIS — M533 Sacrococcygeal disorders, not elsewhere classified: Secondary | ICD-10-CM

## 2020-11-23 DIAGNOSIS — R922 Inconclusive mammogram: Secondary | ICD-10-CM | POA: Diagnosis not present

## 2020-11-23 NOTE — Patient Instructions (Addendum)
  kitchen counter stretches  Hands on kitchen counter,   Palms shoulder width apart  Minisquat postion Trunk is parallel to floor  A) Pull buttocks back to lengthen spine, knees bent  3 breaths   B) Bring R hand to the L, and stretch the R side trunk  3 breaths   Brings hands to center again Do the same to the L side stretch by placing L hand on top of R   D) Modified thread the needle R hand on L thigh, L  thigh pushing out slightly as the R hands pull in,  elbow bent and pulls to theR,  Look under L armpit   Do the same to other side  ____  Restorative yoga:  Pillows/ heavy blankets ramp: Sit sideways , mermaid legs ( pillow b etween knees)  Lengthenspine, turn navel chest onto ramp, relax arms , head turns to the side of the knees  10 min

## 2020-11-23 NOTE — Therapy (Signed)
[place] MAIN [facility] SERVICES [address] [place], [place], [address] Phone: [phone]   Fax:  [phone]  Physical Therapy Treatment  Patient Details  Name: Leah [name] MRN: [id number] Date of Birth: 06-Mar-1966 Referring Provider (PT): Dr. Janese Banks   Encounter Date: 11/23/2020   PT End of Session - 11/23/20 0909     Visit Number 5    Number of Visits 10    Date for PT Re-Evaluation 12/11/20    PT Start Time 0809    PT Stop Time 0902    PT Time Calculation (min) 53 min    Activity Tolerance Patient tolerated treatment well    Behavior During Therapy [facility] for tasks assessed/performed             Past Medical History:  Diagnosis Date   Anxiety    Asthma    as a child   Breast cancer ([place])    Carpal tunnel syndrome    R hand    Colon polyps    Depression    Eosinophilic esophagitis    noted in [place] with GI path report 09/29/14    Esophageal stricture    s/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 Edwin Cap    Family history of breast cancer    Family history of prostate cancer    Herniated disc, cervical    s/p epidural injection   Herpes    History of kidney stones    Hyperlipidemia    Low back pain    Migraines    Personal history of chemotherapy    Personal history of radiation therapy     Past Surgical History:  Procedure Laterality Date   BREAST BIOPSY Left 12/25/2018   [place] BX, invasive mammary carcinoma    BREAST LUMPECTOMY     BREAST LUMPECTOMY WITH NEEDLE LOCALIZATION AND AXILLARY SENTINEL LYMPH NODE BX Left 01/16/2019   Procedure: LEFT BREAST LUMPECTOMY WITH NEEDLE LOCALIZATION AND SENTINEL LYMPH NODE BIOPSY;  Surgeon: Jovita Kussmaul, MD;  Location: [facility] ORS;  Service: General;  Laterality: Left;   CESAREAN SECTION     COLONOSCOPY WITH PROPOFOL N/A 03/27/2017   Procedure: COLONOSCOPY WITH PROPOFOL;  Surgeon: Jonathon Bellows, MD;  Location: [facility] ENDOSCOPY;  Service: Gastroenterology;   Laterality: N/A;   ESOPHAGOGASTRODUODENOSCOPY     ESOPHAGOGASTRODUODENOSCOPY (EGD) WITH PROPOFOL N/A 03/27/2017   Procedure: ESOPHAGOGASTRODUODENOSCOPY (EGD) WITH PROPOFOL WITH DILATION;  Surgeon: Jonathon Bellows, MD;  Location: [facility] ENDOSCOPY;  Service: Gastroenterology;  Laterality: N/A;   PORTACATH PLACEMENT N/A 02/15/2019   Procedure: INSERTION PORT-A-CATH WITH POSSIBLE ULTRASOUND;  Surgeon: Jovita Kussmaul, MD;  Location: [facility] ORS;  Service: General;  Laterality: N/A;   RE-EXCISION OF BREAST CANCER,SUPERIOR MARGINS Left 02/15/2019   Procedure: RE-EXCISION OF LEFT BREAST CANCER ANTERIOR MARGINS;  Surgeon: Jovita Kussmaul, MD;  Location: [facility] ORS;  Service: General;  Laterality: Left;   THROAT SURGERY  1017   UMBILICAL HERNIA REPAIR  2006    x 2   w mesh per pt    There were no vitals filed for this visit.   Subjective Assessment - 11/23/20 0938     Subjective Pt reported shoulder tightness. Pt did last week's HEP when she remembered to    Pertinent History C section, umbilical hernia  repair x 2, lumpectectomy on L 2020 for Stage I Breast Cancer, Denied falls onto tailbone but she had 2 falls within the last 6 months with landed on R knee during these 2 falls.  Underwent chemo and radiation -finished in May and August 2021.    Patient Stated Goals Walk up and down stairs without cringing.     Pain Onset More than a month ago                [facility] PT Assessment - 11/23/20 0936       Palpation   Spinal mobility upper trap/ medial/lower trap tightnes, paraspinal mm tightenss R      Ambulation/Gait   Gait Comments preTx, elevated R shoulder, slight L trunk lean, Post Tx: lowered R shoulder , slight L trunk lean present                           [facility] Adult PT Treatment/Exercise - 11/23/20 0936       Exercises   Other Exercises  throacic rotation stretches in quadriped and standing      Manual Therapy   Manual therapy comments STM/MWM at problem areas noed in  assessment                          PT Long Term Goals - 11/23/20 0942       PT LONG TERM GOAL #1   Title Pt will notice less LBP by 75% improvement and be able to walk at her next job without LBP across one day,    Time 6    Period Weeks    Status On-going      PT LONG TERM GOAL #2   Title Pt's FOTO pelvic pain will improve by    Time 10    Period Weeks    Status On-going      PT LONG TERM GOAL #3   Title Pt will demo increased hip abduction strength from R 3/5, L 3+/5   to > 4/5 B     in order to wealk her dogs which weigh ( 80 lbs, 60lbs)    Time 8    Period Weeks    Status On-going      PT LONG TERM GOAL #4   Title Pt willl report less radiating pain down to first 3 digits of B hands by 50% in fequency and intensity so she can grab items for ADLs    Time 10    Period Weeks    Status On-going      PT LONG TERM GOAL #5   Title Pt will improve BLE strength in order to minimize falls and walk with less pain    Baseline , R DF/EV 4-/5, L 5/5. PF with single UE :  B 10 reps, R hip abd 3/5, L 3+/5    Time 8    Period Weeks    Status On-going      PT LONG TERM GOAL #6   Title Pt will demo no more higher R shoulder/ R hip inorder to demo reciporcal gait pattern to minimize radiating LBP    Time 2    Period Weeks    Status Partially Met      PT LONG TERM GOAL #7   Title Pt will demo decreased abdominal bulge at linea alba in order to demo improved IAP system for LBP, hip pain, and pelvic pain    Time 8    Period Weeks    Status On-going                   Plan - 11/23/20  0909     Clinical Impression Statement Pt continues to require manual Tx to minimize tightness at upper quadrant areas, today focused on R side which helped to minimize upper trap elevation. Pt was instructed on restorative yoga posture to lower upper trap and was explained research based benefits for breast CA survivors. At next session, plan to add multifidis strengthening for  L side to minimize L trunk lean.  Pt continues to benefit from skilled PT   Examination-Activity Limitations Stairs;Other;Locomotion Level;Stand;Lift    Stability/Clinical Decision Making Evolving/Moderate complexity    Rehab Potential Good    PT Frequency 1x / week    PT Duration Other (comment)   10   PT Treatment/Interventions Manual techniques;Therapeutic exercise;Therapeutic activities;Gait training;Neuromuscular re-education;Patient/family education    Consulted and Agree with Plan of Care Patient             Patient will benefit from skilled therapeutic intervention in order to improve the following deficits and impairments:  Pain, Postural dysfunction, Improper body mechanics, Decreased strength, Difficulty walking, Decreased mobility, Decreased coordination, Decreased balance, Abnormal gait, Increased fascial restricitons, Increased muscle spasms, Decreased scar mobility, Decreased range of motion, Decreased endurance  Visit Diagnosis: Muscle weakness (generalized)  Neck pain, chronic  Sacrococcygeal disorders, not elsewhere classified  Chronic right-sided low back pain with right-sided sciatica     Problem List Patient Active Problem List   Diagnosis Date Noted   Cognitive changes 07/03/2020   Annual physical exam 03/19/2020   B12 deficiency 07/19/2019   COVID-19 virus infection 03/13/2019   BMI 29.0-29.9,adult 03/03/2019   Genetic testing 01/29/2019   Family history of breast cancer    Family history of prostate cancer    Goals of care, counseling/discussion 01/03/2019   Malignant neoplasm of left female breast ([place]) 12/28/2018   Abnormal MRI of head 12/11/2018   Dizziness 12/11/2018   Chronic pain 12/11/2018   Vitamin D deficiency 12/11/2018   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 08/21/2018   Insomnia 03/23/2018   Allergic rhinitis 01/03/2018   Anxiety 01/03/2018   Cervical radiculopathy 10/03/2017   Tinea corporis 10/03/2017   Hyperlipidemia  04/04/2017   Breast cyst, left 04/04/2017   Aortic atherosclerosis ([place]) 04/04/2017   Anxiety and depression 03/08/2017   Dysphagia 03/08/2017   Hip pain-right 03/08/2017   Low back pain 03/08/2017   Flank pain 10/18/2016   Asthma 06/01/2016    Jerl Mina, PT 11/23/2020, 10:11 AM  [facility] MAIN [facility] SERVICES [address] [place], [place], [address] Phone: [phone]   Fax:  [phone]  Name: Makennah Omura MRN: [id number] Date of Birth: 10-02-1965

## 2020-11-24 ENCOUNTER — Encounter: Payer: Self-pay | Admitting: Licensed Clinical Social Worker

## 2020-11-26 ENCOUNTER — Inpatient Hospital Stay: Payer: Medicaid Other | Attending: Oncology

## 2020-11-26 ENCOUNTER — Inpatient Hospital Stay: Payer: Medicaid Other

## 2020-11-26 ENCOUNTER — Encounter: Payer: Self-pay | Admitting: Oncology

## 2020-11-26 ENCOUNTER — Inpatient Hospital Stay (HOSPITAL_BASED_OUTPATIENT_CLINIC_OR_DEPARTMENT_OTHER): Payer: Medicaid Other | Admitting: Oncology

## 2020-11-26 ENCOUNTER — Other Ambulatory Visit: Payer: Self-pay | Admitting: *Deleted

## 2020-11-26 VITALS — BP 119/76 | HR 80 | Temp 98.0°F | Resp 17 | Wt 174.0 lb

## 2020-11-26 DIAGNOSIS — Z79899 Other long term (current) drug therapy: Secondary | ICD-10-CM | POA: Diagnosis not present

## 2020-11-26 DIAGNOSIS — Z923 Personal history of irradiation: Secondary | ICD-10-CM | POA: Diagnosis not present

## 2020-11-26 DIAGNOSIS — Z79811 Long term (current) use of aromatase inhibitors: Secondary | ICD-10-CM | POA: Insufficient documentation

## 2020-11-26 DIAGNOSIS — F419 Anxiety disorder, unspecified: Secondary | ICD-10-CM | POA: Diagnosis not present

## 2020-11-26 DIAGNOSIS — Z170 Estrogen receptor positive status [ER+]: Secondary | ICD-10-CM

## 2020-11-26 DIAGNOSIS — Z9221 Personal history of antineoplastic chemotherapy: Secondary | ICD-10-CM | POA: Diagnosis not present

## 2020-11-26 DIAGNOSIS — C50212 Malignant neoplasm of upper-inner quadrant of left female breast: Secondary | ICD-10-CM

## 2020-11-26 DIAGNOSIS — Z87891 Personal history of nicotine dependence: Secondary | ICD-10-CM | POA: Insufficient documentation

## 2020-11-26 DIAGNOSIS — G629 Polyneuropathy, unspecified: Secondary | ICD-10-CM | POA: Insufficient documentation

## 2020-11-26 DIAGNOSIS — Z8042 Family history of malignant neoplasm of prostate: Secondary | ICD-10-CM | POA: Insufficient documentation

## 2020-11-26 DIAGNOSIS — Z803 Family history of malignant neoplasm of breast: Secondary | ICD-10-CM | POA: Insufficient documentation

## 2020-11-26 DIAGNOSIS — Z8719 Personal history of other diseases of the digestive system: Secondary | ICD-10-CM | POA: Diagnosis not present

## 2020-11-26 LAB — CBC WITH DIFFERENTIAL/PLATELET
Abs Immature Granulocytes: 0.01 10*3/uL (ref 0.00–0.07)
Basophils Absolute: 0.1 10*3/uL (ref 0.0–0.1)
Basophils Relative: 1 %
Eosinophils Absolute: 0.4 10*3/uL (ref 0.0–0.5)
Eosinophils Relative: 9 %
HCT: 41.1 % (ref 36.0–46.0)
Hemoglobin: 14.4 g/dL (ref 12.0–15.0)
Immature Granulocytes: 0 %
Lymphocytes Relative: 24 %
Lymphs Abs: 1.1 10*3/uL (ref 0.7–4.0)
MCH: 32.1 pg (ref 26.0–34.0)
MCHC: 35 g/dL (ref 30.0–36.0)
MCV: 91.5 fL (ref 80.0–100.0)
Monocytes Absolute: 0.5 10*3/uL (ref 0.1–1.0)
Monocytes Relative: 11 %
Neutro Abs: 2.6 10*3/uL (ref 1.7–7.7)
Neutrophils Relative %: 55 %
Platelets: 280 10*3/uL (ref 150–400)
RBC: 4.49 MIL/uL (ref 3.87–5.11)
RDW: 11.2 % — ABNORMAL LOW (ref 11.5–15.5)
WBC: 4.8 10*3/uL (ref 4.0–10.5)
nRBC: 0 % (ref 0.0–0.2)

## 2020-11-26 LAB — COMPREHENSIVE METABOLIC PANEL
ALT: 19 U/L (ref 0–44)
AST: 18 U/L (ref 15–41)
Albumin: 3.8 g/dL (ref 3.5–5.0)
Alkaline Phosphatase: 52 U/L (ref 38–126)
Anion gap: 8 (ref 5–15)
BUN: 18 mg/dL (ref 6–20)
CO2: 28 mmol/L (ref 22–32)
Calcium: 9.2 mg/dL (ref 8.9–10.3)
Chloride: 103 mmol/L (ref 98–111)
Creatinine, Ser: 0.75 mg/dL (ref 0.44–1.00)
GFR, Estimated: >60 mL/min (ref >60)
Glucose, Bld: 125 mg/dL — ABNORMAL HIGH (ref 70–99)
Potassium: 4.2 mmol/L (ref 3.5–5.1)
Sodium: 139 mmol/L (ref 135–145)
Total Bilirubin: 0.5 mg/dL (ref 0.3–1.2)
Total Protein: 6.9 g/dL (ref 6.5–8.1)

## 2020-11-26 MED ORDER — SODIUM CHLORIDE 0.9 % IV SOLN
Freq: Once | INTRAVENOUS | Status: AC
  Filled 2020-11-26: qty 250

## 2020-11-26 MED ORDER — ZOLEDRONIC ACID 4 MG/100ML IV SOLN
4.0000 mg | INTRAVENOUS | Status: DC
  Administered 2020-11-26: 4 mg via INTRAVENOUS
  Filled 2020-11-26: qty 100

## 2020-11-26 NOTE — Patient Instructions (Signed)
[facility] ONCOLOG[facility]  Discharge Instructions: Thank you for choosing [facility] to provide your oncology and hematology care.  If you have a lab appointment with the [facility], please go directly to the [facility] and check in at the registration area.  Wear comfortable clothing and clothing appropriate for easy access to any Portacath or PICC line.   We strive to give you quality time with your provider. You may need to reschedule your appointment if you arrive late (15 or more minutes).  Arriving late affects you and other patients whose appointments are after yours.  Also, if you miss three or more appointments without notifying the office, you may be dismissed from the clinic at the provider's discretion.      For prescription refill requests, have your pharmacy contact our office and allow 72 hours for refills to be completed.    Today you received zometa   To help prevent nausea and vomiting after your treatment, we encourage you to take your nausea medication as directed.  BELOW ARE SYMPTOMS THAT SHOULD BE REPORTED IMMEDIATELY: *FEVER GREATER THAN 100.4 F (38 C) OR HIGHER *CHILLS OR SWEATING *NAUSEA AND VOMITING THAT IS NOT CONTROLLED WITH YOUR NAUSEA MEDICATION *UNUSUAL SHORTNESS OF BREATH *UNUSUAL BRUISING OR BLEEDING *URINARY PROBLEMS (pain or burning when urinating, or frequent urination) *BOWEL PROBLEMS (unusual diarrhea, constipation, pain near the anus) TENDERNESS IN MOUTH AND THROAT WITH OR WITHOUT PRESENCE OF ULCERS (sore throat, sores in mouth, or a toothache) UNUSUAL RASH, SWELLING OR PAIN  UNUSUAL VAGINAL DISCHARGE OR ITCHING   Items with * indicate a potential emergency and should be followed up as soon as possible or go to the Emergency Department if any problems should occur.  Please show the CHEMOTHERAPY ALERT CARD or IMMUNOTHERAPY ALERT CARD at check-in to the Emergency Department and triage nurse.  Should you  have questions after your visit or need to cancel or reschedule your appointment, please contact [facility]  [phone] and follow the prompts.  Office hours are 8:00 a.m. to 4:30 p.m. Monday - Friday. Please note that voicemails left after 4:00 p.m. may not be returned until the following business day.  We are closed weekends and major holidays. You have access to a nurse at all times for urgent questions. Please call the main number to the clinic [phone] and follow the prompts.  For any non-urgent questions, you may also contact your provider using MyChart. We now offer e-Visits for anyone 38 and older to request care online for non-urgent symptoms. For details visit [email or web address].   Also download the MyChart app! Go to the app store, search "MyChart", open the app, select [facility], and log in with your MyChart username and password.  Due to Covid, a mask is required upon entering the hospital/clinic. If you do not have a mask, one will be given to you upon arrival. For doctor visits, patients may have 1 support person aged 25 or older with them. For treatment visits, patients cannot have anyone with them due to current Covid guidelines and our immunocompromised population.

## 2020-11-26 NOTE — Progress Notes (Signed)
Hematology/Oncology Consult note [facility]  [phone] [phone]  Patient Care Team: McLean-Scocuzza, Nino Glow, MD as PCP - General (Internal Medicine) Jovita Kussmaul, MD as Consulting Physician (General Surgery) Noreene Filbert, MD as Referring Physician (Radiation Oncology) Rico Junker, RN as Registered Nurse Sindy Guadeloupe, MD as Consulting Physician (Oncology)   Name of the patient: Leah [name]  [id number]  July 04, 1965   Date of visit: 11/26/20  Diagnosis-  pathological prognostic stage Ia invasive mammary carcinoma pT1b pN1 cM0 ER/PR positive HER-2/neu negative s/p lumpectomy, adjuvant chemotherapy and radiation currently on Arimidex   Chief complaint/ Reason for visit-routine follow-up of breast cancer on Arimidex   Heme/Onc history: Patient is a 55 year old postmenopausal female G2 P2 L2 who recently underwent screening bilateral mammogram which showed area of concern in her left breast.  This was followed by an ultrasound and Core biopsy.  Ultrasound showed 1 x 1.1 x 0.7 cm hypoechoic mass at the 10:30 position of the left breast.  Left axilla appeared normal.  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    Final pathology showed 9 mm grade 1 invasive mammary carcinoma 1 out of 2 lymph nodes involved with macro metastatic carcinoma 3.5 mm.  Anterior margin was focally positive for which patient underwent reexcision surgery with negative margins [id number] (sn)   MammaPrint score came back as high risk with a risk of recurrence of 29% at 10 years without chemotherapy   Patient completed 4 cycles of dose dense AC chemotherapy and 12 cycles of weekly Taxol.  She was started on letrozole in August 2021 but could not tolerate it due to diarrhea and is currently on Arimidex  Interval history-patient reports doing overall well.  Neuropathy is stable.  She is on  Cymbalta and occasionally does acupuncture.  Has persistent joint pain and is working with Rosalyn Gess in Occupational Therapy.  She is also now seeing a physical therapist to help with hip and back issues.  Has occasional brain fog and word finding issues.  Reports having a rough month.  Her ex-husband passed away and her son is currently living with her.  ECOG PS- 0 Pain scale- 0   Review of systems- Review of Systems  Constitutional:  Positive for malaise/fatigue. Negative for chills, fever and weight loss.  HENT:  Negative for congestion, ear pain and tinnitus.   Eyes: Negative.  Negative for blurred vision and double vision.  Respiratory: Negative.  Negative for cough, sputum production and shortness of breath.   Cardiovascular: Negative.  Negative for chest pain, palpitations and leg swelling.  Gastrointestinal: Negative.  Negative for abdominal pain, constipation, diarrhea, nausea and vomiting.  Genitourinary:  Negative for dysuria, frequency and urgency.  Musculoskeletal:  Positive for back pain and joint pain. Negative for falls.  Skin: Negative.  Negative for rash.  Neurological:  Positive for sensory change (Neuropathy stable). Negative for weakness and headaches.  Endo/Heme/Allergies: Negative.  Does not bruise/bleed easily.  Psychiatric/Behavioral:  Positive for memory loss. Negative for depression. The patient is not nervous/anxious and does not have insomnia.      Allergies  Allergen Reactions   Hydrocodone-Acetaminophen Hives    All over her body.   Lactose Intolerance (Gi) Other (See Comments)    Bloating and GI upset   Penicillins Rash    Did it involve swelling of the face/tongue/throat, SOB, or low BP? Unknown Did it involve sudden or severe  rash/hives, skin peeling, or any reaction on the inside of your mouth or nose? Yes Did you need to seek medical attention at a hospital or doctor's office? Unknown When did it last happen? Childhood reaction.      If all  above answers are "NO", may proceed with cephalosporin use.    Sulfa Antibiotics Rash    Full body rash      Past Medical History:  Diagnosis Date   Anxiety    Asthma    as a child   Breast cancer ([place])    Carpal tunnel syndrome    R hand    Colon polyps    Depression    Eosinophilic esophagitis    noted in [place] with GI path report 09/29/14    Esophageal stricture    s/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 Edwin Cap    Family history of breast cancer    Family history of prostate cancer    Herniated disc, cervical    s/p epidural injection   Herpes    History of kidney stones    Hyperlipidemia    Low back pain    Migraines    Personal history of chemotherapy    Personal history of radiation therapy      Past Surgical History:  Procedure Laterality Date   BREAST BIOPSY Left 12/25/2018   [place] BX, invasive mammary carcinoma    BREAST LUMPECTOMY     BREAST LUMPECTOMY WITH NEEDLE LOCALIZATION AND AXILLARY SENTINEL LYMPH NODE BX Left 01/16/2019   Procedure: LEFT BREAST LUMPECTOMY WITH NEEDLE LOCALIZATION AND SENTINEL LYMPH NODE BIOPSY;  Surgeon: Jovita Kussmaul, MD;  Location: [facility] ORS;  Service: General;  Laterality: Left;   CESAREAN SECTION     COLONOSCOPY WITH PROPOFOL N/A 03/27/2017   Procedure: COLONOSCOPY WITH PROPOFOL;  Surgeon: Jonathon Bellows, MD;  Location: [facility] ENDOSCOPY;  Service: Gastroenterology;  Laterality: N/A;   ESOPHAGOGASTRODUODENOSCOPY     ESOPHAGOGASTRODUODENOSCOPY (EGD) WITH PROPOFOL N/A 03/27/2017   Procedure: ESOPHAGOGASTRODUODENOSCOPY (EGD) WITH PROPOFOL WITH DILATION;  Surgeon: Jonathon Bellows, MD;  Location: [facility] ENDOSCOPY;  Service: Gastroenterology;  Laterality: N/A;   PORTACATH PLACEMENT N/A 02/15/2019   Procedure: INSERTION PORT-A-CATH WITH POSSIBLE ULTRASOUND;  Surgeon: Jovita Kussmaul, MD;  Location: [facility] ORS;  Service: General;  Laterality: N/A;   RE-EXCISION OF BREAST CANCER,SUPERIOR MARGINS Left 02/15/2019   Procedure:  RE-EXCISION OF LEFT BREAST CANCER ANTERIOR MARGINS;  Surgeon: Jovita Kussmaul, MD;  Location: [facility] ORS;  Service: General;  Laterality: Left;   THROAT SURGERY  3833   UMBILICAL HERNIA REPAIR  2006    x 2   w mesh per pt    Social History   Socioeconomic History   Marital status: Single    Spouse name: Not on file   Number of children: Not on file   Years of education: Not on file   Highest education level: Not on file  Occupational History   Not on file  Tobacco Use   Smoking status: Former    Packs/day: 1.00    Years: 20.00    Pack years: 20.00    Types: Cigarettes    Quit date: 06/02/2006    Years since quitting: 14.4   Smokeless tobacco: Never  Vaping Use   Vaping Use: Never used  Substance and Sexual Activity   Alcohol use: Not Currently   Drug use: No   Sexual activity: Yes    Partners: Male  Other Topics Concern   Not on file  Social History Narrative   Works in Scientist, 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 As of 02/26/17    Divorced.    Former smoker 20+ years quit in 1997 then started again for 5 years and quit 10-12 years ago as of 03/08/17    Social Determinants of Radio broadcast assistant Strain: Not on file  Food Insecurity: Not on file  Transportation Needs: Not on file  Physical Activity: Not on file  Stress: Not on file  Social Connections: Not on file  Intimate Partner Violence: Not on file    Family History  Problem Relation Age of Onset   Breast cancer Mother        dx late 90s   Thyroid disease Mother    Colon polyps Mother    Prostate cancer Father        dx 63   Breast cancer Maternal Grandmother        dx late 28s   Cancer Maternal Uncle        possibly, unsure type     Current Outpatient Medications:    acetaminophen (TYLENOL) 500 MG tablet, Take 500-1,000 mg by mouth every 6 (six) hours as needed (for pain.)., Disp: , Rfl:    anastrozole (ARIMIDEX) 1  MG tablet, TAKE 1 TABLET BY MOUTH EVERY DAY, Disp: 90 tablet, Rfl: 2   atorvastatin (LIPITOR) 10 MG tablet, Take 1 tablet (10 mg total) by mouth daily., Disp: 90 tablet, Rfl: 3   Cholecalciferol (VITAMIN D-3) 125 MCG (5000 UT) TABS, Take 5,000 Units by mouth daily., Disp: , Rfl:    Cyanocobalamin (VITAMIN B-12 PO), Take 1 tablet by mouth daily. 1037m daily, Disp: , Rfl:    DULoxetine (CYMBALTA) 60 MG capsule, TAKE 1 CAPSULE BY MOUTH EVERY DAY, Disp: 90 capsule, Rfl: 1   gabapentin (NEURONTIN) 300 MG capsule, TAKE 1 CAPSULE BY MOUTH THREE TIMES A DAY, Disp: 90 capsule, Rfl: 2   meclizine (ANTIVERT) 12.5 MG tablet, TAKE 1 TABLET (12.5 MG TOTAL) BY MOUTH 3 (THREE) TIMES DAILY AS NEEDED FOR DIZZINESS., Disp: 90 tablet, Rfl: 1   meloxicam (MOBIC) 15 MG tablet, Take 1 tablet (15 mg total) by mouth daily as needed for pain., Disp: 90 tablet, Rfl: 3   methocarbamol (ROBAXIN) 750 MG tablet, Take 1 tablet (750 mg total) by mouth every 6 (six) hours as needed for muscle spasms., Disp: 120 tablet, Rfl: 5   omeprazole (PRILOSEC) 40 MG capsule, Take 1 capsule (40 mg total) by mouth every evening., Disp: 90 capsule, Rfl: 3   tiZANidine (ZANAFLEX) 4 MG tablet, Take 4 mg by mouth at bedtime., Disp: , Rfl:    traMADol (ULTRAM) 50 MG tablet, Take 1-2 tablets (50-100 mg total) by mouth every 6 (six) hours as needed., Disp: 20 tablet, Rfl: 0   traMADol (ULTRAM) 50 MG tablet, Take by mouth., Disp: , Rfl:    traZODone (DESYREL) 50 MG tablet, Take 1-1.5 tablets (50-75 mg total) by mouth at bedtime as needed for sleep., Disp: 140 tablet, Rfl: 4   fluticasone (FLONASE) 50 MCG/ACT nasal spray, Place 2 sprays into both nostrils daily. Max 2 sprays (Patient not taking: No sig reported), Disp: 16 g, Rfl: 12   valACYclovir (VALTREX) 1000 MG tablet, Take 1 tablet (1,000 mg total) by mouth daily. Suppression. BID X 5 days with outbreak as needed (Patient not taking: No sig reported), Disp: 120  tablet, Rfl: 3 No current  facility-administered medications for this visit.  Facility-Administered Medications Ordered in Other Visits:    0.9 %  sodium chloride infusion, , Intravenous, Once, Sindy Guadeloupe, MD   Zoledronic Acid (ZOMETA) IVPB 4 mg, 4 mg, Intravenous, Q6 months, Sindy Guadeloupe, MD  Physical exam:  Vitals:   11/26/20 0951  BP: 119/76  Pulse: 80  Resp: 17  Temp: 98 F (36.7 C)  TempSrc: Tympanic  SpO2: 98%  Weight: 174 lb (78.9 kg)   Physical Exam Constitutional:      Appearance: Normal appearance.  HENT:     Head: Normocephalic and atraumatic.  Eyes:     Pupils: Pupils are equal, round, and reactive to light.  Cardiovascular:     Rate and Rhythm: Normal rate and regular rhythm.     Heart sounds: Normal heart sounds. No murmur heard. Pulmonary:     Effort: Pulmonary effort is normal.     Breath sounds: Normal breath sounds. No wheezing.  Abdominal:     General: Bowel sounds are normal. There is no distension.     Palpations: Abdomen is soft.     Tenderness: There is no abdominal tenderness.  Musculoskeletal:        General: Normal range of motion.     Cervical back: Normal range of motion.  Skin:    General: Skin is warm and dry.     Findings: No rash.  Neurological:     Mental Status: She is alert and oriented to person, place, and time.  Psychiatric:        Judgment: Judgment normal.     CMP Latest Ref Rng & Units 11/26/2020  Glucose 70 - 99 mg/dL 125(H)  BUN 6 - 20 mg/dL 18  Creatinine 0.44 - 1.00 mg/dL 0.75  Sodium 135 - 145 mmol/L 139  Potassium 3.5 - 5.1 mmol/L 4.2  Chloride 98 - 111 mmol/L 103  CO2 22 - 32 mmol/L 28  Calcium 8.9 - 10.3 mg/dL 9.2  Total Protein 6.5 - 8.1 g/dL 6.9  Total Bilirubin 0.3 - 1.2 mg/dL 0.5  Alkaline Phos 38 - 126 U/L 52  AST 15 - 41 U/L 18  ALT 0 - 44 U/L 19   CBC Latest Ref Rng & Units 11/26/2020  WBC 4.0 - 10.5 K/uL 4.8  Hemoglobin 12.0 - 15.0 g/dL 14.4  Hematocrit 36.0 - 46.0 % 41.1  Platelets 150 - 400 K/uL 280      Assessment and plan- Patient is a 55 y.o. female with prior history of polycythemia now with history of invasive Stage Ia left breast cancer ER/PR positive HER2/neu negative. She had a lumpectomy and High risk MammaPrint score.  She is status post 4 cycles of dose dense AC and 12 cycles of Taxol chemotherapy. She also completed XRT in August 2021.  She was started on Arimidex (Aug 2021) which she is tolerating well.  Clinically there is no evidence of recurrence.  Reports tolerating Arimidex well.  Mammogram from 11/23/2020 was BI-RADS Category 2 benign and recommended diagnostic mammogram in 1 year. Calcium levels are normal and acceptable to proceed with Zometa. Plan is for her to return to clinic in 6 months with repeat labs CBC, CMP and Zometa.   I spent 20 minutes dedicated to the care of this patient (face-to-face and non-face-to-face) on the date of the encounter to include what is described in the assessment and plan.   Visit Diagnosis 1. Malignant neoplasm of upper-inner quadrant of left breast  in female, estrogen receptor positive ([place])     Faythe Casa, NP 11/26/2020 10:27 AM

## 2020-11-26 NOTE — Progress Notes (Signed)
Patient here for oncology follow-up appointment, expresses no complaints or concerns at this time.    

## 2020-11-27 ENCOUNTER — Encounter: Payer: Medicaid Other | Admitting: Physical Therapy

## 2020-11-27 ENCOUNTER — Ambulatory Visit: Payer: Medicaid Other | Admitting: Radiation Oncology

## 2020-11-30 ENCOUNTER — Other Ambulatory Visit: Payer: Self-pay

## 2020-11-30 ENCOUNTER — Ambulatory Visit: Payer: Medicaid Other | Admitting: Physical Therapy

## 2020-11-30 DIAGNOSIS — M25522 Pain in left elbow: Secondary | ICD-10-CM | POA: Diagnosis not present

## 2020-11-30 DIAGNOSIS — M6281 Muscle weakness (generalized): Secondary | ICD-10-CM

## 2020-11-30 DIAGNOSIS — G8929 Other chronic pain: Secondary | ICD-10-CM | POA: Diagnosis not present

## 2020-11-30 DIAGNOSIS — M25641 Stiffness of right hand, not elsewhere classified: Secondary | ICD-10-CM | POA: Diagnosis not present

## 2020-11-30 DIAGNOSIS — M79644 Pain in right finger(s): Secondary | ICD-10-CM | POA: Diagnosis not present

## 2020-11-30 DIAGNOSIS — M25631 Stiffness of right wrist, not elsewhere classified: Secondary | ICD-10-CM | POA: Diagnosis not present

## 2020-11-30 DIAGNOSIS — R2689 Other abnormalities of gait and mobility: Secondary | ICD-10-CM | POA: Diagnosis not present

## 2020-11-30 DIAGNOSIS — M542 Cervicalgia: Secondary | ICD-10-CM | POA: Diagnosis not present

## 2020-11-30 DIAGNOSIS — M25521 Pain in right elbow: Secondary | ICD-10-CM | POA: Diagnosis not present

## 2020-11-30 DIAGNOSIS — M25561 Pain in right knee: Secondary | ICD-10-CM | POA: Diagnosis not present

## 2020-11-30 DIAGNOSIS — M5441 Lumbago with sciatica, right side: Secondary | ICD-10-CM | POA: Diagnosis not present

## 2020-11-30 DIAGNOSIS — M25559 Pain in unspecified hip: Secondary | ICD-10-CM | POA: Diagnosis not present

## 2020-11-30 DIAGNOSIS — M533 Sacrococcygeal disorders, not elsewhere classified: Secondary | ICD-10-CM | POA: Diagnosis not present

## 2020-11-30 DIAGNOSIS — M79645 Pain in left finger(s): Secondary | ICD-10-CM | POA: Diagnosis not present

## 2020-11-30 NOTE — Therapy (Signed)
[place] MAIN [facility] SERVICES [address] [place], [place], [address] Phone: [phone]   Fax:  [phone]  Physical Therapy Treatment  Patient Details  Name: Leah [name] MRN: [id number] Date of Birth: 06/27/1965 Referring Provider (PT): Dr. Janese Banks   Encounter Date: 11/30/2020   PT End of Session - 11/30/20 0802     Visit Number 6    Number of Visits --    Date for PT Re-Evaluation 37/10/62   recert 6/94/85   Authorization Type 6 visit end 4/62 (recert 09/13/48 and resubmiss to ins)    PT Start Time 0801    PT Stop Time 0900    PT Time Calculation (min) 59 min    Activity Tolerance Patient tolerated treatment well    Behavior During Therapy [facility] for tasks assessed/performed             Past Medical History:  Diagnosis Date   Anxiety    Asthma    as a child   Breast cancer ([place])    Carpal tunnel syndrome    R hand    Colon polyps    Depression    Eosinophilic esophagitis    noted in [place] with GI path report 09/29/14    Esophageal stricture    s/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 Edwin Cap    Family history of breast cancer    Family history of prostate cancer    Herniated disc, cervical    s/p epidural injection   Herpes    History of kidney stones    Hyperlipidemia    Low back pain    Migraines    Personal history of chemotherapy    Personal history of radiation therapy     Past Surgical History:  Procedure Laterality Date   BREAST BIOPSY Left 12/25/2018   [place] BX, invasive mammary carcinoma    BREAST LUMPECTOMY     BREAST LUMPECTOMY WITH NEEDLE LOCALIZATION AND AXILLARY SENTINEL LYMPH NODE BX Left 01/16/2019   Procedure: LEFT BREAST LUMPECTOMY WITH NEEDLE LOCALIZATION AND SENTINEL LYMPH NODE BIOPSY;  Surgeon: Jovita Kussmaul, MD;  Location: [facility] ORS;  Service: General;  Laterality: Left;   CESAREAN SECTION     COLONOSCOPY WITH PROPOFOL N/A 03/27/2017   Procedure: COLONOSCOPY WITH  PROPOFOL;  Surgeon: Jonathon Bellows, MD;  [facility]: [facility] ENDOSCOPY;  Service: Gastroenterology;  Laterality: N/A;   ESOPHAGOGASTRODUODENOSCOPY     ESOPHAGOGASTRODUODENOSCOPY (EGD) WITH PROPOFOL N/A 03/27/2017   Procedure: ESOPHAGOGASTRODUODENOSCOPY (EGD) WITH PROPOFOL WITH DILATION;  Surgeon: Jonathon Bellows, MD;  Location: [facility] ENDOSCOPY;  Service: Gastroenterology;  Laterality: N/A;   PORTACATH PLACEMENT N/A 02/15/2019   Procedure: INSERTION PORT-A-CATH WITH POSSIBLE ULTRASOUND;  Surgeon: Jovita Kussmaul, MD;  Location: [facility] ORS;  Service: General;  Laterality: N/A;   RE-EXCISION OF BREAST CANCER,SUPERIOR MARGINS Left 02/15/2019   Procedure: RE-EXCISION OF LEFT BREAST CANCER ANTERIOR MARGINS;  Surgeon: Jovita Kussmaul, MD;  Location: [facility] ORS;  Service: General;  Laterality: Left;   THROAT SURGERY  0938   UMBILICAL HERNIA REPAIR  2006    x 2   w mesh per pt    There were no vitals filed for this visit.   Subjective Assessment - 11/30/20 0850     Subjective Pt states her shoulderes feel better , R shoulder feels tight sometimes. Pt used to carry bags on her R shoulder. Pt report her R hip had not hurt for a while since PT started. Pt noticed it hurting 5/10  one time this past week. Pain today is 0/10.    Pertinent History C section, umbilical hernia  repair x 2, lumpectectomy on L 2020 for Stage I Breast Cancer, Denied falls onto tailbone but she had 2 falls within the last 6 months with landed on R knee during these 2 falls. Underwent chemo and radiation -finished in May and August 2021.    Patient Stated Goals Walk up and down stairs without cringing.     Pain Onset More than a month ago                [facility] PT Assessment - 11/30/20 0851       Strength   Overall Strength Comments PF B  20 reps,    ( single UE support)                        Pelvic Floor Special Questions - 11/30/20 0848     Diastasis Recti less bulge with headlift , slight bulge above umbilicus    External  Perineal Exam through clothing: tightness of R pelvic floor ( decreased post Tx)               [facility] Adult PT Treatment/Exercise - 11/30/20 9323       Neuro Re-ed    Neuro Re-ed Details  cued for deep core and obliqe mm strengthening,      Exercises   Other Exercises  see pt instructions to improve strength of deep core. multifidis, oblique      Manual Therapy   Manual therapy comments STM/MWM at problem areas noted in assessment                          PT Long Term Goals - 11/30/20 0810       PT LONG TERM GOAL #1   Title Pt will notice less LBP by 75% improvement and be able to walk at her next job without LBP across one day,    Time 6    Period Weeks    Status Achieved      PT LONG TERM GOAL #2   Title Pt's FOTO pelvic pain will improve from 13 points to < 7 pts inorder to improve QOL    Baseline 13 pts at eval,  11/30/20: 38 pts    Time 10    Period Weeks    Status On-going      PT LONG TERM GOAL #3   Title Pt will demo increased hip abduction strength from R 3/5, L 3+/5   to > 4/5 B     in order to wealk her dogs which weigh ( 80 lbs, 60lbs)    Baseline 11/30/20: R 4/5, L 4-/5    Time 8    Period Weeks    Status Partially Met      PT LONG TERM GOAL #4   Title Pt willl report less radiating pain down to first 3 digits of B hands by 50% in fequency and intensity so she can grab items for ADLs  ( 9/19: L hand with no radiating pain)    Time 10    Period Weeks    Status Partially Met      PT LONG TERM GOAL #5   Title Pt will improve BLE strength in order to minimize falls and walk with less pain    Baseline , R DF/EV 4-/5, L 5/5. PF with single UE :  B 10 reps, R hip abd 3/5, L 3+/5  ( 11/30/20:  R DF/EV 5/5, PF with single UE :  R 20reps, L 19 reps, R 4/5, L 4-/5    Time 8    Period Weeks    Status On-going      Additional Long Term Goals   Additional Long Term Goals Yes      PT LONG TERM GOAL #6   Title Pt will demo no more higher R  shoulder/ R hip in order to demo reciporcal gait pattern to minimize radiating LBP    Time 2    Period Weeks    Status Achieved      PT LONG TERM GOAL #7   Title Pt will demo decreased abdominal bulge at linea alba in order to demo improved IAP system for LBP, hip pain, and pelvic pain  ( 11/30/20: slight bulge above umbilicus)    Time 8    Period Weeks    Status On-going      PT LONG TERM GOAL #8   Title Pt will demo proper technique with deep core coordination level 1-2 without cues in order to promote trunk stability and less gait deviations and R pelvic / hip pain    Time 10    Period Weeks    Status New    Target Date 02/08/21      PT LONG TERM GOAL  #9   TITLE Pt will demo no tightness of R anterior pelvic floor mm and have no pain with hip IR/add in order to promote QOL    Baseline tightness of R anterior pelvic floor mm    Time 10    Period Weeks    Status New    Target Date 02/08/21                   Plan - 11/30/20 0809     Clinical Impression Statement Pt has achieved 2/9  goals and progressing towards remaining goals. Pt has achieved levelled shoulders and pelvic girdle alignment which has positively impacted in decreasing her LBP.However, L hip abduction strength and lumbar mm remain weak and causes a trunk lean in her gait that is associated with R pelvic/groin pain.  Abdominal bulging is improving gradually but pt will need more deep core strengthening to minimize risk of umbilical hernia. Initiated deep core, oblique, and multifidis  strengthening which will address the above deficits. Pt demo'd correct form with cues.  Upgraded L hip abduction strengthening with red band at thigh.  R pelvic anterior mm tightness which decreased after Tx. Plan to further address pelvic floor at upcoming sessions.   Pt continues to benefit from skilled PT.      Examination-Activity Limitations Stairs;Other;Locomotion Level;Stand;Lift    Stability/Clinical Decision Making  Evolving/Moderate complexity    Rehab Potential Good    PT Frequency 1x / week    PT Duration Other (comment)   10   PT Treatment/Interventions Manual techniques;Therapeutic exercise;Therapeutic activities;Gait training;Neuromuscular re-education;Patient/family education    Consulted and Agree with Plan of Care Patient             Patient will benefit from skilled therapeutic intervention in order to improve the following deficits and impairments:  Pain, Postural dysfunction, Improper body mechanics, Decreased strength, Difficulty walking, Decreased mobility, Decreased coordination, Decreased balance, Abnormal gait, Increased fascial restricitons, Increased muscle spasms, Decreased scar mobility, Decreased range of motion, Decreased endurance  Visit Diagnosis: Muscle weakness (generalized)  Neck pain, chronic  Sacrococcygeal  disorders, not elsewhere classified  Chronic right-sided low back pain with right-sided sciatica     Problem List Patient Active Problem List   Diagnosis Date Noted   Cognitive changes 07/03/2020   Annual physical exam 03/19/2020   B12 deficiency 07/19/2019   COVID-19 virus infection 03/13/2019   BMI 29.0-29.9,adult 03/03/2019   Genetic testing 01/29/2019   Family history of breast cancer    Family history of prostate cancer    Goals of care, counseling/discussion 01/03/2019   Malignant neoplasm of left female breast ([place]) 12/28/2018   Abnormal MRI of head 12/11/2018   Dizziness 12/11/2018   Chronic pain 12/11/2018   Vitamin D deficiency 12/11/2018   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 08/21/2018   Insomnia 03/23/2018   Allergic rhinitis 01/03/2018   Anxiety 01/03/2018   Cervical radiculopathy 10/03/2017   Tinea corporis 10/03/2017   Hyperlipidemia 04/04/2017   Breast cyst, left 04/04/2017   Aortic atherosclerosis ([place]) 04/04/2017   Anxiety and depression 03/08/2017   Dysphagia 03/08/2017   Hip pain-right 03/08/2017   Low back pain  03/08/2017   Flank pain 10/18/2016   Asthma 06/01/2016    Jerl Mina, PT 11/30/2020, 9:43 AM  [place] MAIN [facility] SERVICES [address] [place], [place], [address] Phone: [phone]   Fax:  [phone]  Name: Leah [name] MRN: [id number] Date of Birth: September 15, 1965

## 2020-11-30 NOTE — Patient Instructions (Signed)
L clams with R band ( R sidelying)  10-15 reps  ___  Multifidis twist  Band is on doorknob: stand further away from door (facing perpendicular)   Twisting trunk without moving the hips and knees Hold band at the level of ribcage, elbows bent,shoulder blades roll back and down like squeezing a pencil under armpit    Exhale twist,.10-15 deg away from door without moving your hips/ knees. Continue to maintain equal weight through legs. Keep knee unlocked.  20-30 reps   __  ______________  Oblique/ scapula stabilization   Opposite arm   Place band in "U"    band under ballmounds  while laying on back w/ knees bent     20 reps  on each side

## 2020-12-01 ENCOUNTER — Ambulatory Visit: Payer: Medicaid Other | Admitting: Occupational Therapy

## 2020-12-01 DIAGNOSIS — M25561 Pain in right knee: Secondary | ICD-10-CM | POA: Diagnosis not present

## 2020-12-01 DIAGNOSIS — M25522 Pain in left elbow: Secondary | ICD-10-CM

## 2020-12-01 DIAGNOSIS — M542 Cervicalgia: Secondary | ICD-10-CM | POA: Diagnosis not present

## 2020-12-01 DIAGNOSIS — R2689 Other abnormalities of gait and mobility: Secondary | ICD-10-CM | POA: Diagnosis not present

## 2020-12-01 DIAGNOSIS — M5441 Lumbago with sciatica, right side: Secondary | ICD-10-CM | POA: Diagnosis not present

## 2020-12-01 DIAGNOSIS — M533 Sacrococcygeal disorders, not elsewhere classified: Secondary | ICD-10-CM | POA: Diagnosis not present

## 2020-12-01 DIAGNOSIS — M79644 Pain in right finger(s): Secondary | ICD-10-CM

## 2020-12-01 DIAGNOSIS — M25631 Stiffness of right wrist, not elsewhere classified: Secondary | ICD-10-CM | POA: Diagnosis not present

## 2020-12-01 DIAGNOSIS — M25521 Pain in right elbow: Secondary | ICD-10-CM

## 2020-12-01 DIAGNOSIS — M6281 Muscle weakness (generalized): Secondary | ICD-10-CM

## 2020-12-01 DIAGNOSIS — M79645 Pain in left finger(s): Secondary | ICD-10-CM | POA: Diagnosis not present

## 2020-12-01 DIAGNOSIS — M25559 Pain in unspecified hip: Secondary | ICD-10-CM | POA: Diagnosis not present

## 2020-12-01 DIAGNOSIS — G8929 Other chronic pain: Secondary | ICD-10-CM | POA: Diagnosis not present

## 2020-12-01 DIAGNOSIS — M25641 Stiffness of right hand, not elsewhere classified: Secondary | ICD-10-CM | POA: Diagnosis not present

## 2020-12-01 NOTE — Therapy (Signed)
[place] [facility] [address] [place], [address] Phone: [phone]   Fax:  [phone]  Occupational Therapy Treatment  Patient Details  Name: Genesia Caslin MRN: [id number] Date of Birth: 12/13/1965 Referring Provider (OT): Dr Janese Banks   Encounter Date: 12/01/2020   OT End of Session - 12/01/20 0847     Visit Number 2    Number of Visits 6    Date for OT Re-Evaluation 12/31/20    OT Start Time 0822    OT Stop Time 0847    OT Time Calculation (min) 25 min    Activity Tolerance Patient tolerated treatment well    Behavior During Therapy [facility] for tasks assessed/performed             Past Medical History:  Diagnosis Date   Anxiety    Asthma    as a child   Breast cancer ([place])    Carpal tunnel syndrome    R hand    Colon polyps    Depression    Eosinophilic esophagitis    noted in [place] with GI path report 09/29/14    Esophageal stricture    s/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 Edwin Cap    Family history of breast cancer    Family history of prostate cancer    Herniated disc, cervical    s/p epidural injection   Herpes    History of kidney stones    Hyperlipidemia    Low back pain    Migraines    Personal history of chemotherapy    Personal history of radiation therapy     Past Surgical History:  Procedure Laterality Date   BREAST BIOPSY Left 12/25/2018   [place] BX, invasive mammary carcinoma    BREAST LUMPECTOMY     BREAST LUMPECTOMY WITH NEEDLE LOCALIZATION AND AXILLARY SENTINEL LYMPH NODE BX Left 01/16/2019   Procedure: LEFT BREAST LUMPECTOMY WITH NEEDLE LOCALIZATION AND SENTINEL LYMPH NODE BIOPSY;  Surgeon: Jovita Kussmaul, MD;  Location: [facility] ORS;  Service: General;  Laterality: Left;   CESAREAN SECTION     COLONOSCOPY WITH PROPOFOL N/A 03/27/2017   Procedure: COLONOSCOPY WITH PROPOFOL;  Surgeon: Jonathon Bellows, MD;  Location: [facility] ENDOSCOPY;  Service:  Gastroenterology;  Laterality: N/A;   ESOPHAGOGASTRODUODENOSCOPY     ESOPHAGOGASTRODUODENOSCOPY (EGD) WITH PROPOFOL N/A 03/27/2017   Procedure: ESOPHAGOGASTRODUODENOSCOPY (EGD) WITH PROPOFOL WITH DILATION;  Surgeon: Jonathon Bellows, MD;  Location: [facility] ENDOSCOPY;  Service: Gastroenterology;  Laterality: N/A;   PORTACATH PLACEMENT N/A 02/15/2019   Procedure: INSERTION PORT-A-CATH WITH POSSIBLE ULTRASOUND;  Surgeon: Jovita Kussmaul, MD;  Location: [facility] ORS;  Service: General;  Laterality: N/A;   RE-EXCISION OF BREAST CANCER,SUPERIOR MARGINS Left 02/15/2019   Procedure: RE-EXCISION OF LEFT BREAST CANCER ANTERIOR MARGINS;  Surgeon: Jovita Kussmaul, MD;  Location: [facility] ORS;  Service: General;  Laterality: Left;   THROAT SURGERY  3810   UMBILICAL HERNIA REPAIR  2006    x 2   w mesh per pt    There were no vitals filed for this visit.   Subjective Assessment - 12/01/20 0846     Subjective  DOing okay - better - has been wearing my L wrist splint but not really by R - R hand and wrist doing good- but L thumb bothering me - elbows doing great    Pertinent History Patient is a 55 y.o. female with prior history of polycythemia now with  invasive mammary carcinoma pathologic  prognostic stage Ia of the left breast pT1b pN1 acM0 ER/PR positive HER-2/neu negative.  She is s/p lumpectomy with high risk MammaPrint score.  She is s/p 4 cycles of dose dense AC and 12 cycles of weekly Taxol chemotherapy.   She is here for routine follow-up of breast cancer     Clinically patient is doing well with no concern in signs and symptoms of recurrence based on today's exam.  She will continue to take Arimidex.  She was due for Zometa today.  Calcium levels are normal and acceptable to proceed with Zometa today I will see her back in 6 months with CBC with differential CMP for next dose of Zometa.  Her mammogram would be due in September 2022 which would be ordered by Dr. Marlou Starks. Refer for OT screen on 08/26/20  by [facility] for  bilateral arm pain , numbness and risk for lymphedema - request by pt to be seen by OT for tx    Patient Stated Goals Want to get the pain and numbness in my hands better    Currently in Pain? No/denies                [facility] OT Assessment - 12/01/20 0001       AROM   Right Wrist Extension 85 Degrees    Right Wrist Flexion 90 Degrees    Left Wrist Extension 85 Degrees    Left Wrist Flexion 95 Degrees      Strength   Right Hand Grip (lbs) 52    Right Hand Lateral Pinch 15 lbs    Right Hand 3 Point Pinch 14 lbs    Left Hand Grip (lbs) 50    Left Hand Lateral Pinch 12 lbs    Left Hand 3 Point Pinch 11 lbs              Great progress in AROM for wrist and digits - pt report no stiffness - and no pain at wrist and elbow Wynn [place] negative bilateral  CT no symptoms on the R - L positive Phalen's - Tinel negative bilateral  Phalen's negative on the R  Grip and prehension strength increase -see flowsheet Pt grinding test positive on the L - pain reported on the L  Pt fitted with CMC neoprene splint on the L during day as needed  And CT splint on the L night time - and as needed during day CT splint on the R as needed        Can cont with HEP  as needed for contrast 2 x day  Soft tissue for CT spreads and volar forearms and wrist prior to ROM  Prayer stretch lightly - 10 x  Tendon glides 10 x  Med N glide 5 reps    Follow up in month                OT Education - 12/01/20 0847     Education Details progress and HEP/splint wearing    Person(s) Educated Patient    Comprehension Verbal cues required;Returned demonstration;Verbalized understanding              OT Short Term Goals - 12/01/20 0848       OT SHORT TERM GOAL #1   Title Pt to be ind in HEP to increase AROM and decrease stiffness and pain in bilateral handsl, wrist and elbows    Baseline pain 1-7/10 in hands and elbows- medial epicondyle - tendernes- decrease R thumb , wrist AROM-  NOW  increase ROM , no stiffness , pain only in L thumb    Time 1    Period Weeks    Status On-going    Target Date 12/10/20               OT Long Term Goals - 12/01/20 0849       OT LONG TERM GOAL #1   Title Pt show decrease pain less than 2/10 in the hand and elbow on bilateral hands with use at work and in the am    Baseline pain 1-7/10 thumbs, wrist and elbows -R worse than L  NOW - no pain in wrist and elbows, R hand no pain - L thumb pain 2/10    Time 1    Period Weeks    Status On-going    Target Date 12/10/20      OT LONG TERM GOAL #2   Title Pt show AROM in bilateral hands and wrist to WNL to use with more ease in ADL's and iADl's    Baseline decrease R thumb PA 48, L 57; wrist ext and flexionR decrease compare to L -   NOW AROM WNL in wrist and htumbs - grip and prehension strength increase    Status Achieved      OT LONG TERM GOAL #3   Title Pt show decrease numbness and pain in bilateral hands - to less than 3 episodes at week    Baseline numbness in the am and as the days goes - pain 2-4 /10 constant in R elbow and bilateral thumb- increase in bilateral hands and elbows as the day goes  NOW no numbness but had Positive Phalens on the L still    Time 4    Period Weeks    Status On-going    Target Date 12/31/20                   Plan - 12/01/20 0848     Clinical Impression Statement Pt present at OT eval with bilateral CTS and medial epicondyle - R worse than the L and R hand worse than the elbow,  pain  2/10 at the elbow,  4/10 at he hand  and increase to 7/10 as the day goes- pt with decrease AROM in R thumb and wrist with increase tightness over volar forearm and wrist - as well as hand -Positive Tinel and Phalens in bilateral CT, tenderness and pain in R more than L medial epicondyle.  Pt fitted with bilateral wrist splints to wear most all the time - off for HEP - do contrast , soft tissue and ROM - modify how she grasp and lift /carry objects- pt grip and  prehension strength WFL but lower range for her age - numbness most of the time in hands-  THIS DATE - pt return with no symptoms in elbows or thumb tendinitis - report no CTS on the R , L do have some CMC thumb pain and Positive Phalens- but Tinels negative bilateral - GRip and prehension increase as well as AROM for wrist and diigts - no stiffness- pt to cont with CT splint on the L and CMC neoprene splint as needed - wear CT splint as needed on the R - cont with modifications, HEP and splints - follow up in month if pt is able to progress and maintain -  pt can benefit from skilled OT services    OT Occupational Profile and History Problem Focused Assessment - Including  review of records relating to presenting problem    Occupational performance deficits (Please refer to evaluation for details): ADL's;IADL's;Rest and Sleep;Work;Leisure;Play    Body Structure / Function / Physical Skills ADL;Strength;Pain;UE functional use;ROM;Flexibility;Sensation;Decreased knowledge of precautions    Rehab Potential Fair    Clinical Decision Making Limited treatment options, no task modification necessary    Comorbidities Affecting Occupational Performance: None    Modification or Assistance to Complete Evaluation  No modification of tasks or assist necessary to complete eval    OT Frequency Monthly    OT Duration 4 weeks    OT Treatment/Interventions Self-care/ADL training;DME and/or AE instruction;Contrast Bath;Splinting;Therapeutic exercise;Ultrasound;Iontophoresis;Manual Therapy;Patient/family education;Passive range of motion    Consulted and Agree with Plan of Care Patient             Patient will benefit from skilled therapeutic intervention in order to improve the following deficits and impairments:   Body Structure / Function / Physical Skills: ADL, Strength, Pain, UE functional use, ROM, Flexibility, Sensation, Decreased knowledge of precautions       Visit Diagnosis: Muscle weakness  (generalized)  Pain of both thumbs  Stiffness of right hand, not elsewhere classified  Stiffness of right wrist, not elsewhere classified  Pain of both elbows    Problem List Patient Active Problem List   Diagnosis Date Noted   Cognitive changes 07/03/2020   Annual physical exam 03/19/2020   B12 deficiency 07/19/2019   COVID-19 virus infection 03/13/2019   BMI 29.0-29.9,adult 03/03/2019   Genetic testing 01/29/2019   Family history of breast cancer    Family history of prostate cancer    Goals of care, counseling/discussion 01/03/2019   Malignant neoplasm of left female breast ([place]) 12/28/2018   Abnormal MRI of head 12/11/2018   Dizziness 12/11/2018   Chronic pain 12/11/2018   Vitamin D deficiency 12/11/2018   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 08/21/2018   Insomnia 03/23/2018   Allergic rhinitis 01/03/2018   Anxiety 01/03/2018   Cervical radiculopathy 10/03/2017   Tinea corporis 10/03/2017   Hyperlipidemia 04/04/2017   Breast cyst, left 04/04/2017   Aortic atherosclerosis ([place]) 04/04/2017   Anxiety and depression 03/08/2017   Dysphagia 03/08/2017   Hip pain-right 03/08/2017   Low back pain 03/08/2017   Flank pain 10/18/2016   Asthma 06/01/2016    Rosalyn Gess, OTR/L,CLT 12/01/2020, 8:54 AM  [place] [facility] [address] [place], [address] Phone: [phone]   Fax:  [phone]  Name: Yazmen Briones MRN: [id number] Date of Birth: October 26, 1965

## 2020-12-04 ENCOUNTER — Encounter: Payer: Medicaid Other | Admitting: Physical Therapy

## 2020-12-07 ENCOUNTER — Ambulatory Visit: Payer: Medicaid Other | Admitting: Physical Therapy

## 2020-12-11 ENCOUNTER — Encounter: Payer: Medicaid Other | Admitting: Physical Therapy

## 2020-12-14 ENCOUNTER — Other Ambulatory Visit: Payer: Self-pay

## 2020-12-14 ENCOUNTER — Ambulatory Visit: Payer: Medicaid Other | Attending: Oncology | Admitting: Physical Therapy

## 2020-12-14 DIAGNOSIS — M533 Sacrococcygeal disorders, not elsewhere classified: Secondary | ICD-10-CM | POA: Diagnosis not present

## 2020-12-14 DIAGNOSIS — G8929 Other chronic pain: Secondary | ICD-10-CM | POA: Insufficient documentation

## 2020-12-14 DIAGNOSIS — M6281 Muscle weakness (generalized): Secondary | ICD-10-CM | POA: Diagnosis not present

## 2020-12-14 DIAGNOSIS — M5441 Lumbago with sciatica, right side: Secondary | ICD-10-CM | POA: Diagnosis not present

## 2020-12-14 DIAGNOSIS — R2689 Other abnormalities of gait and mobility: Secondary | ICD-10-CM | POA: Diagnosis not present

## 2020-12-14 NOTE — Patient Instructions (Signed)
Lying on back, knees bent    band under ballmounds  while laying on back w/ knees bent  __________________________  Oblique/ scapula stabilization   Opposite arm   Place band in "U"    band under ballmounds  while laying on back w/ knees bent     20 reps  on each side  Holding band from opposite thigh,  Inhale,    exhale then pull band across body while keeping elbow , shoulders, back of the head pressed down    ______________ One sided L lengthening / strengthening  L arm pull up , dragging along bed  20 reps   _________________  Multifidis twist to the R only   Band is on doorknob: stand further away from door (facing perpendicular)   Twisting trunk without moving the hips and knees Hold band at the level of ribcage, elbows bent,shoulder blades roll back and down like squeezing a pencil under armpit    Exhale twist,.10-15 deg away from door without moving your hips/ knees. Continue to maintain equal weight through legs. Keep knee unlocked.  30 reps

## 2020-12-14 NOTE — Therapy (Addendum)
[place] MAIN [facility] SERVICES [address] [place], [place], [address] Phone: [phone]   Fax:  [phone]  Physical Therapy Treatment  Patient Details  Name: Leah [name] MRN: [id number] Date of Birth: 07/13/1965 Referring Provider (PT): Dr. Janese Banks   Encounter Date: 12/14/2020   PT End of Session - 12/14/20 1020     Visit Number 7    Date for PT Re-Evaluation 78/67/54   recert 4/92/01   Authorization Type (recert 0/07/12)    PT Start Time 0800    PT Stop Time 0900    PT Time Calculation (min) 60 min    Activity Tolerance Patient tolerated treatment well    Behavior During Therapy [facility] for tasks assessed/performed             Past Medical History:  Diagnosis Date   Anxiety    Asthma    as a child   Breast cancer ([place])    Carpal tunnel syndrome    R hand    Colon polyps    Depression    Eosinophilic esophagitis    noted in [place] with GI path report 09/29/14    Esophageal stricture    s/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 Edwin Cap    Family history of breast cancer    Family history of prostate cancer    Herniated disc, cervical    s/p epidural injection   Herpes    History of kidney stones    Hyperlipidemia    Low back pain    Migraines    Personal history of chemotherapy    Personal history of radiation therapy     Past Surgical History:  Procedure Laterality Date   BREAST BIOPSY Left 12/25/2018   [place] BX, invasive mammary carcinoma    BREAST LUMPECTOMY     BREAST LUMPECTOMY WITH NEEDLE LOCALIZATION AND AXILLARY SENTINEL LYMPH NODE BX Left 01/16/2019   Procedure: LEFT BREAST LUMPECTOMY WITH NEEDLE LOCALIZATION AND SENTINEL LYMPH NODE BIOPSY;  Surgeon: Jovita Kussmaul, MD;  Location: [facility] ORS;  Service: General;  Laterality: Left;   CESAREAN SECTION     COLONOSCOPY WITH PROPOFOL N/A 03/27/2017   Procedure: COLONOSCOPY WITH PROPOFOL;  Surgeon: Jonathon Bellows, MD;  Location: [facility] ENDOSCOPY;   Service: Gastroenterology;  Laterality: N/A;   ESOPHAGOGASTRODUODENOSCOPY     ESOPHAGOGASTRODUODENOSCOPY (EGD) WITH PROPOFOL N/A 03/27/2017   Procedure: ESOPHAGOGASTRODUODENOSCOPY (EGD) WITH PROPOFOL WITH DILATION;  Surgeon: Jonathon Bellows, MD;  Location: [facility] ENDOSCOPY;  Service: Gastroenterology;  Laterality: N/A;   PORTACATH PLACEMENT N/A 02/15/2019   Procedure: INSERTION PORT-A-CATH WITH POSSIBLE ULTRASOUND;  Surgeon: Jovita Kussmaul, MD;  Location: [facility] ORS;  Service: General;  Laterality: N/A;   RE-EXCISION OF BREAST CANCER,SUPERIOR MARGINS Left 02/15/2019   Procedure: RE-EXCISION OF LEFT BREAST CANCER ANTERIOR MARGINS;  Surgeon: Jovita Kussmaul, MD;  Location: [facility] ORS;  Service: General;  Laterality: Left;   THROAT SURGERY  1975   UMBILICAL HERNIA REPAIR  2006    x 2   w mesh per pt    There were no vitals filed for this visit.   Subjective Assessment - 12/14/20 0859     Subjective Despite how much stress she has been through the past 2 weeks, pt only had tightness in her R shoulder. Her back has not hurt for the past month.    Pertinent History C section, umbilical hernia  repair x 2, lumpectectomy on L 2020 for Stage I Breast Cancer, Denied falls onto tailbone  but she had 2 falls within the last 6 months with landed on R knee during these 2 falls. Underwent chemo and radiation -finished in May and August 2021.    Patient Stated Goals Walk up and down stairs without cringing.     Pain Onset More than a month ago                [facility] PT Assessment - 12/14/20 1014       Coordination   Coordination and Movement Description upper trap overuse with multidifis twist      Palpation   Palpation comment Tightness R paraspinals, hypomobile T7-10, intercostals T5-7 anterior/ posteral/ lateral, L SCM/ occipitals, masseter      Ambulation/Gait   Gait Comments shoulders levelled, pelvis levelled, slight trunk lean L, shortening on L low back                            [facility] Adult PT Treatment/Exercise - 12/14/20 1016       Neuro Re-ed    Neuro Re-ed Details  cued for less upper trap overuse, proper technique  multifidis twist to R only, hooklying Lat eccentric strengthening, oblique strengthening      Modalities   Modalities Moist Heat      Moist Heat Therapy   Number Minutes Moist Heat 5 Minutes    Moist Heat Location Other (comment)   thoracic     Manual Therapy   Manual therapy comments STM/MWM at problem areas noted in assessment to promote mobility,                          PT Long Term Goals - 11/30/20 0810       PT LONG TERM GOAL #1   Title Pt will notice less LBP by 75% improvement and be able to walk at her next job without LBP across one day,    Time 6    Period Weeks    Status Achieved      PT LONG TERM GOAL #2   Title Pt's FOTO pelvic pain will improve from 13 points to < 7 pts inorder to improve QOL    Baseline 13 pts at eval,  11/30/20: 38 pts    Time 10    Period Weeks    Status On-going      PT LONG TERM GOAL #3   Title Pt will demo increased hip abduction strength from R 3/5, L 3+/5   to > 4/5 B     in order to wealk her dogs which weigh ( 80 lbs, 60lbs)    Baseline 11/30/20: R 4/5, L 4-/5    Time 8    Period Weeks    Status Partially Met      PT LONG TERM GOAL #4   Title Pt willl report less radiating pain down to first 3 digits of B hands by 50% in fequency and intensity so she can grab items for ADLs  ( 9/19: L hand with no radiating pain)    Time 10    Period Weeks    Status Partially Met      PT LONG TERM GOAL #5   Title Pt will improve BLE strength in order to minimize falls and walk with less pain    Baseline , R DF/EV 4-/5, L 5/5. PF with single UE :  B 10 reps, R hip abd 3/5, L 3+/5  (  11/30/20:  R DF/EV 5/5, PF with single UE :  R 20reps, L 19 reps, R 4/5, L 4-/5    Time 8    Period Weeks    Status On-going      Additional Long Term Goals   Additional Long Term Goals Yes      PT LONG  TERM GOAL #6   Title Pt will demo no more higher R shoulder/ R hip in order to demo reciporcal gait pattern to minimize radiating LBP    Time 2    Period Weeks    Status Achieved      PT LONG TERM GOAL #7   Title Pt will demo decreased abdominal bulge at linea alba in order to demo improved IAP system for LBP, hip pain, and pelvic pain  ( 11/30/20: slight bulge above umbilicus)    Time 8    Period Weeks    Status On-going      PT LONG TERM GOAL #8   Title Pt will demo proper technique with deep core coordination level 1-2 without cues in order to promote trunk stability and less gait deviations and R pelvic / hip pain    Time 10    Period Weeks    Status New    Target Date 02/08/21      PT LONG TERM GOAL  #9   TITLE Pt will demo no tightness of R anterior pelvic floor mm and have no pain with hip IR/add in order to promote QOL    Baseline tightness of R anterior pelvic floor mm    Time 10    Period Weeks    Status New    Target Date 02/08/21                   Plan - 12/14/20 1022     Clinical Impression Statement Pt is making progress with reported resolved LBP for over a month.  Patient showed level shoulders and pelvis however she experienced tightness in her right shoulder area.  Patient tolerated manual treatment today and demonstrated less tensions and hypomobility at the thoracic spine and left neck.  This is associated with the deviation she has in her gait which is being addressed with strengthening exercises for the left lower spine (one-sided eccentric latissimus left strengthening and  R multifidus twist)    Added oblique strengthening in hook lying position. Pt required excessive cues for less upper trap overuse .  Pt demo'd proper technique post Tx. Pt continues to benefit form skilled PT. Plan to assess C section scar restrictions next session.    Examination-Activity Limitations Stairs;Other;Locomotion Level;Stand;Lift    Stability/Clinical Decision Making  Evolving/Moderate complexity    Rehab Potential Good    PT Frequency 1x / week    PT Duration Other (comment)   10   PT Treatment/Interventions Manual techniques;Therapeutic exercise;Therapeutic activities;Gait training;Neuromuscular re-education;Patient/family education    Consulted and Agree with Plan of Care Patient             Patient will benefit from skilled therapeutic intervention in order to improve the following deficits and impairments:  Pain, Postural dysfunction, Improper body mechanics, Decreased strength, Difficulty walking, Decreased mobility, Decreased coordination, Decreased balance, Abnormal gait, Increased fascial restricitons, Increased muscle spasms, Decreased scar mobility, Decreased range of motion, Decreased endurance  Visit Diagnosis: Other abnormalities of gait and mobility  Sacrococcygeal disorders, not elsewhere classified  Chronic right-sided low back pain with right-sided sciatica     Problem List Patient Active  Problem List   Diagnosis Date Noted   Cognitive changes 07/03/2020   Annual physical exam 03/19/2020   B12 deficiency 07/19/2019   COVID-19 virus infection 03/13/2019   BMI 29.0-29.9,adult 03/03/2019   Genetic testing 01/29/2019   Family history of breast cancer    Family history of prostate cancer    Goals of care, counseling/discussion 01/03/2019   Malignant neoplasm of left female breast ([place]) 12/28/2018   Abnormal MRI of head 12/11/2018   Dizziness 12/11/2018   Chronic pain 12/11/2018   Vitamin D deficiency 12/11/2018   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 08/21/2018   Insomnia 03/23/2018   Allergic rhinitis 01/03/2018   Anxiety 01/03/2018   Cervical radiculopathy 10/03/2017   Tinea corporis 10/03/2017   Hyperlipidemia 04/04/2017   Breast cyst, left 04/04/2017   Aortic atherosclerosis ([place]) 04/04/2017   Anxiety and depression 03/08/2017   Dysphagia 03/08/2017   Hip pain-right 03/08/2017   Low back pain 03/08/2017    Flank pain 10/18/2016   Asthma 06/01/2016    Jerl Mina, PT 12/14/2020, 10:26 AM  [place] [facility] MAIN [facility] SERVICES [address] [place], [place], [address] Phone: [phone]   Fax:  [phone]  Name: Leah [name] MRN: [id number] Date of Birth: Oct 04, 1965

## 2020-12-18 ENCOUNTER — Ambulatory Visit
Admission: RE | Admit: 2020-12-18 | Discharge: 2020-12-18 | Disposition: A | Discharge status: Home / Self Care | Payer: Medicaid Other | Source: Ambulatory Visit | Attending: Radiation Oncology | Admitting: Radiation Oncology

## 2020-12-18 ENCOUNTER — Encounter: Payer: Medicaid Other | Admitting: Physical Therapy

## 2020-12-18 ENCOUNTER — Encounter: Payer: Self-pay | Admitting: Radiation Oncology

## 2020-12-18 ENCOUNTER — Other Ambulatory Visit: Payer: Self-pay

## 2020-12-18 VITALS — BP 140/80 | HR 90 | Temp 97.0°F | Wt 173.3 lb

## 2020-12-18 DIAGNOSIS — Z79811 Long term (current) use of aromatase inhibitors: Secondary | ICD-10-CM | POA: Diagnosis not present

## 2020-12-18 DIAGNOSIS — Z170 Estrogen receptor positive status [ER+]: Secondary | ICD-10-CM | POA: Insufficient documentation

## 2020-12-18 DIAGNOSIS — C50212 Malignant neoplasm of upper-inner quadrant of left female breast: Secondary | ICD-10-CM | POA: Insufficient documentation

## 2020-12-18 DIAGNOSIS — Z923 Personal history of irradiation: Secondary | ICD-10-CM | POA: Diagnosis not present

## 2020-12-18 NOTE — Progress Notes (Signed)
Radiation Oncology Follow up Note  Name: Leah [name]   Date:   12/18/2020 MRN:  [id number] DOB: Sep 07, 1965    This 55 y.o. 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  REFERRING PROVIDER: McLean-Scocuzza, Leah [name] *  HPI: Patient is a 55 year old female now at one 1 year having completed whole breast radiation to her left breast for stage Ia ER positive invasive mammary carcinoma seen today in routine follow-up she is doing well.  She specifically denies breast tenderness cough or bone pain..  She had mammograms back in September which I have reviewed were BI-RADS 2 benign.  She is currently on Arimidex tolerant at well without side effect.  COMPLICATIONS OF TREATMENT: none  FOLLOW UP COMPLIANCE: keeps appointments   PHYSICAL EXAM:  BP 140/80   Pulse 90   Temp (!) 97 F (36.1 C) (Tympanic)   Wt 173 lb 4.8 oz (78.6 kg)   BMI 29.29 kg/m  Lungs are clear to A&P cardiac examination essentially unremarkable with regular rate and rhythm. No dominant mass or nodularity is noted in either breast in 2 positions examined. Incision is well-healed. No axillary or supraclavicular adenopathy is appreciated. Cosmetic result is excellent.  Well-developed well-nourished patient in NAD. HEENT reveals PERLA, EOMI, discs not visualized.  Oral cavity is clear. No oral mucosal lesions are identified. Neck is clear without evidence of cervical or supraclavicular adenopathy. Lungs are clear to A&P. Cardiac examination is essentially unremarkable with regular rate and rhythm without murmur rub or thrill. Abdomen is benign with no organomegaly or masses noted. Motor sensory and DTR levels are equal and symmetric in the upper and lower extremities. Cranial nerves II through XII are grossly intact. Proprioception is intact. No peripheral adenopathy or edema is  identified. No motor or sensory levels are noted. Crude visual fields are within normal range.  RADIOLOGY RESULTS: Mammograms reviewed compatible with above-stated findings  PLAN: Present time patient is doing well now at 1 year with no evidence of disease.  And pleased with her overall progress.  Of asked to see her back in 1 year for follow-up.  She continues on Arimidex without side effect.  Patient knows to call with any concerns.  I would like to take this opportunity to thank you for allowing me to participate in the care of your patient.Noreene Filbert, MD

## 2020-12-20 ENCOUNTER — Other Ambulatory Visit: Payer: Self-pay | Admitting: Oncology

## 2020-12-21 ENCOUNTER — Ambulatory Visit: Payer: Medicaid Other | Admitting: Physical Therapy

## 2020-12-21 ENCOUNTER — Encounter: Payer: Self-pay | Admitting: Oncology

## 2020-12-25 ENCOUNTER — Encounter: Payer: Medicaid Other | Admitting: Physical Therapy

## 2020-12-28 ENCOUNTER — Ambulatory Visit: Payer: Medicaid Other | Admitting: Physical Therapy

## 2020-12-28 ENCOUNTER — Other Ambulatory Visit: Payer: Self-pay

## 2020-12-28 DIAGNOSIS — M533 Sacrococcygeal disorders, not elsewhere classified: Secondary | ICD-10-CM | POA: Diagnosis not present

## 2020-12-28 DIAGNOSIS — M6281 Muscle weakness (generalized): Secondary | ICD-10-CM | POA: Diagnosis not present

## 2020-12-28 DIAGNOSIS — M5441 Lumbago with sciatica, right side: Secondary | ICD-10-CM | POA: Diagnosis not present

## 2020-12-28 DIAGNOSIS — G8929 Other chronic pain: Secondary | ICD-10-CM

## 2020-12-28 DIAGNOSIS — R2689 Other abnormalities of gait and mobility: Secondary | ICD-10-CM

## 2020-12-28 NOTE — Therapy (Addendum)
[place] MAIN [facility] SERVICES [address] [place], [place], [address] Phone: [phone]   Fax:  [phone]  Physical Therapy Treatment  Patient Details  Name: Leah [name] MRN: [id number] Date of Birth: 14-Nov-1965 Referring Provider (PT): Dr. Janese Banks   Encounter Date: 12/28/2020   PT End of Session - 12/28/20 0855     Visit Number 8    Date for PT Re-Evaluation 56/38/75   recert 6/43/32   Authorization Type (recert 9/51/88)    PT Start Time 0810    PT Stop Time 0905    PT Time Calculation (min) 55 min    Activity Tolerance Patient tolerated treatment well    Behavior During Therapy [facility] for tasks assessed/performed             Past Medical History:  Diagnosis Date   Anxiety    Asthma    as a child   Breast cancer ([place])    Carpal tunnel syndrome    R hand    Colon polyps    Depression    Eosinophilic esophagitis    noted in [place] with GI path report 09/29/14    Esophageal stricture    s/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 Edwin Cap    Family history of breast cancer    Family history of prostate cancer    Herniated disc, cervical    s/p epidural injection   Herpes    History of kidney stones    Hyperlipidemia    Low back pain    Migraines    Personal history of chemotherapy    Personal history of radiation therapy     Past Surgical History:  Procedure Laterality Date   BREAST BIOPSY Left 12/25/2018   [place] BX, invasive mammary carcinoma    BREAST LUMPECTOMY     BREAST LUMPECTOMY WITH NEEDLE LOCALIZATION AND AXILLARY SENTINEL LYMPH NODE BX Left 01/16/2019   Procedure: LEFT BREAST LUMPECTOMY WITH NEEDLE LOCALIZATION AND SENTINEL LYMPH NODE BIOPSY;  Surgeon: Jovita Kussmaul, MD;  Location: [facility] ORS;  Service: General;  Laterality: Left;   CESAREAN SECTION     COLONOSCOPY WITH PROPOFOL N/A 03/27/2017   Procedure: COLONOSCOPY WITH PROPOFOL;  Surgeon: Jonathon Bellows, MD;  Location: [facility] ENDOSCOPY;   Service: Gastroenterology;  Laterality: N/A;   ESOPHAGOGASTRODUODENOSCOPY     ESOPHAGOGASTRODUODENOSCOPY (EGD) WITH PROPOFOL N/A 03/27/2017   Procedure: ESOPHAGOGASTRODUODENOSCOPY (EGD) WITH PROPOFOL WITH DILATION;  Surgeon: Jonathon Bellows, MD;  Location: [facility] ENDOSCOPY;  Service: Gastroenterology;  Laterality: N/A;   PORTACATH PLACEMENT N/A 02/15/2019   Procedure: INSERTION PORT-A-CATH WITH POSSIBLE ULTRASOUND;  Surgeon: Jovita Kussmaul, MD;  Location: [facility] ORS;  Service: General;  Laterality: N/A;   RE-EXCISION OF BREAST CANCER,SUPERIOR MARGINS Left 02/15/2019   Procedure: RE-EXCISION OF LEFT BREAST CANCER ANTERIOR MARGINS;  Surgeon: Jovita Kussmaul, MD;  Location: [facility] ORS;  Service: General;  Laterality: Left;   THROAT SURGERY  4166   UMBILICAL HERNIA REPAIR  2006    x 2   w mesh per pt    There were no vitals filed for this visit.   Subjective Assessment - 12/28/20 0814     Subjective Pt reported tried the band exercises without issues. Pt moved into new apt and moved boxes. Pt feels tired and has R groin pain 9/10 nonradiating. It hurts with walking. Pt reported she had 25-40% less pain with sexual intercourse    Pertinent History C section, umbilical hernia  repair x 2, lumpectectomy on  L 2020 for Stage I Breast Cancer, Denied falls onto tailbone but she had 2 falls within the last 6 months with landed on R knee during these 2 falls. Underwent chemo and radiation -finished in May and August 2021.    Patient Stated Goals Walk up and down stairs without cringing.     Pain Onset More than a month ago                [facility] PT Assessment - 12/28/20 0815       Strength   Overall Strength Comments B hipabd L 4-/5, R 3+/5      Palpation   SI assessment  R SIJ hypomobile inferior border, sacrum lacking nutation, ilia in ER/ abd with concordant pain ( sidelying L) , tightness / pain at anterior triangle below pubic symphysis , ischial rami R, hamstring R    Palpation comment slight anterior  rotation of R PSIS, pain with hip ext standing.                           [facility] PT Treatment/Exercise - 12/28/20 0856       Neuro Re-ed    Neuro Re-ed Details  cued for clam shell, upright version for hip abduction      Modalities   Modalities Moist Heat      Moist Heat Therapy   Number Minutes Moist Heat 5 Minutes    Moist Heat Location --   sacrum     Manual Therapy   Manual therapy comments STM/MWM at problem areas noted in assessment to promote mobility, nutation of sacrum, anterior rotation of R ilia                          PT Long Term Goals - 12/28/20 0923       PT LONG TERM GOAL #1   Title Pt will notice less LBP by 75% improvement and be able to walk at her next job without LBP across one day,    Time 6    Period Weeks    Status Achieved      PT LONG TERM GOAL #2   Title Pt's FOTO pelvic pain will improve from 13 points to < 7 pts inorder to improve QOL    Baseline 13 pts at eval,  11/30/20: 38 pts    Time 10    Period Weeks    Status On-going      PT LONG TERM GOAL #3   Title Pt will demo increased hip abduction strength from R 3/5, L 3+/5   to > 4/5 B     in order to wealk her dogs which weigh ( 80 lbs, 60lbs)    Baseline 11/30/20: R 4/5, L 4-/5    Time 8    Period Weeks    Status Partially Met      PT LONG TERM GOAL #4   Title Pt willl report less radiating pain down to first 3 digits of B hands by 50% in fequency and intensity so she can grab items for ADLs  ( 9/19: L hand with no radiating pain)    Time 10    Period Weeks    Status Partially Met      PT LONG TERM GOAL #5   Title Pt will improve BLE strength in order to minimize falls and walk with less pain    Baseline , R DF/EV  4-/5, L 5/5. PF with single UE :  B 10 reps, R hip abd 3/5, L 3+/5  ( 11/30/20:  R DF/EV 5/5, PF with single UE :  R 20reps, L 19 reps, R 4/5, L 4-/5    Time 8    Period Weeks    Status On-going      PT LONG TERM GOAL #6   Title Pt  will demo no more higher R shoulder/ R hip in order to demo reciporcal gait pattern to minimize radiating LBP    Time 2    Period Weeks    Status Achieved      PT LONG TERM GOAL #7   Title Pt will demo decreased abdominal bulge at linea alba in order to demo improved IAP system for LBP, hip pain, and pelvic pain  ( 11/30/20: slight bulge above umbilicus)    Time 8    Period Weeks    Status On-going      PT LONG TERM GOAL #8   Title Pt will demo proper technique with deep core coordination level 1-2 without cues in order to promote trunk stability and less gait deviations and R pelvic / hip pain    Time 10    Period Weeks    Status Achieved      PT LONG TERM GOAL  #9   TITLE Pt will demo no tightness of R anterior pelvic floor mm and have no pain with hip IR/add in order to promote QOL    Baseline tightness of R anterior pelvic floor mm    Time 10    Period Weeks    Status On-going                   Plan - 12/28/20 0855     Clinical Impression Statement Pt is progressing well with minor misalignment of her pelvic girdle on the right presenting today.  Patient required manual therapy which she tolerated rated well.  After manual therapy, right SIJ was less hypomobile and less painful at R groin with hip abduction and flexion in left side-lying position and also in hip extension from standing position.  Patient will continue to require more strengthening to minimize lowered left shoulder due to asymmetry in her spine and left lumbar musculature.  Progressed today with upright functional exercise to strengthen hip abduction.  Also applied fascial mobilizations over C-section scar and soft tissue mobilization over anterior pelvic floor muscles today to facilitate less pain at the right groin.  Plan to assess pelvic floor at next session.  Patient reported less pain with intercourse by 25 to 40% this past week.  And based on today's findings with increased muscle tension and increased  C-section scar tensions further pelvic floor treatment will be needed to help her address remaining goals.  Plan to continue adding abdominal strengthening and lumbar strengthening exercises at upcoming sessions as well.  Patient continues to benefit from skilled PT.   Examinaton-Activity Limitations Stairs;Other;Locomotion Level;Stand;Lift    Stability/Clinical Decision Making Evolving/Moderate complexity    Rehab Potential Good    PT Frequency 1x / week    PT Duration Other (comment)   10   PT Treatment/Interventions Manual techniques;Therapeutic exercise;Therapeutic activities;Gait training;Neuromuscular re-education;Patient/family education    Consulted and Agree with Plan of Care Patient             Patient will benefit from skilled therapeutic intervention in order to improve the following deficits and impairments:  Pain, Postural dysfunction, Improper body  mechanics, Decreased strength, Difficulty walking, Decreased mobility, Decreased coordination, Decreased balance, Abnormal gait, Increased fascial restricitons, Increased muscle spasms, Decreased scar mobility, Decreased range of motion, Decreased endurance  Visit Diagnosis: Sacrococcygeal disorders, not elsewhere classified  Chronic right-sided low back pain with right-sided sciatica  Muscle weakness (generalized)  Other abnormalities of gait and mobility     Problem List Patient Active Problem List   Diagnosis Date Noted   Cognitive changes 07/03/2020   Annual physical exam 03/19/2020   B12 deficiency 07/19/2019   COVID-19 virus infection 03/13/2019   BMI 29.0-29.9,adult 03/03/2019   Genetic testing 01/29/2019   Family history of breast cancer    Family history of prostate cancer    Goals of care, counseling/discussion 01/03/2019   Malignant neoplasm of left female breast ([place]) 12/28/2018   Abnormal MRI of head 12/11/2018   Dizziness 12/11/2018   Chronic pain 12/11/2018   Vitamin D deficiency 12/11/2018    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 08/21/2018   Insomnia 03/23/2018   Allergic rhinitis 01/03/2018   Anxiety 01/03/2018   Cervical radiculopathy 10/03/2017   Tinea corporis 10/03/2017   Hyperlipidemia 04/04/2017   Breast cyst, left 04/04/2017   Aortic atherosclerosis ([place]) 04/04/2017   Anxiety and depression 03/08/2017   Dysphagia 03/08/2017   Hip pain-right 03/08/2017   Low back pain 03/08/2017   Flank pain 10/18/2016   Asthma 06/01/2016    Jerl Mina, PT 12/28/2020, 9:36 AM  [place] MAIN [facility] SERVICES [address] [place], [place], [address] Phone: [phone]   Fax:  [phone]  Name: Leah [name] MRN: [id number] Date of Birth: 1966-01-31

## 2020-12-28 NOTE — Patient Instructions (Signed)
Minisquat: Scoot buttocks back slight, hinge like you are looking at your reflection on a pond  Knees behind toes,  Inhale to "smell flowers"  Exhale on the rise "like rocket"  Do not lock knees, have more weight across ballmounds of feet, toes relaxed   THEN HALF step on both feet first lap with left foot leading down a hall way = 1 lap  Repeated with other foot leading =1 lap  2 laps each side

## 2021-01-01 ENCOUNTER — Encounter: Payer: Medicaid Other | Admitting: Physical Therapy

## 2021-01-04 ENCOUNTER — Encounter: Payer: Medicaid Other | Admitting: Physical Therapy

## 2021-01-08 ENCOUNTER — Encounter: Payer: Medicaid Other | Admitting: Physical Therapy

## 2021-01-11 ENCOUNTER — Ambulatory Visit: Payer: Medicaid Other | Admitting: Physical Therapy

## 2021-01-18 ENCOUNTER — Ambulatory Visit: Payer: Medicaid Other | Attending: Oncology | Admitting: Physical Therapy

## 2021-02-12 ENCOUNTER — Other Ambulatory Visit: Payer: Self-pay | Admitting: Nurse Practitioner

## 2021-02-15 ENCOUNTER — Encounter: Payer: Self-pay | Admitting: Oncology

## 2021-02-16 ENCOUNTER — Other Ambulatory Visit: Payer: Self-pay

## 2021-02-16 MED ORDER — DULOXETINE HCL 60 MG PO CPEP: ORAL_CAPSULE | ORAL | 1 refills | Status: DC

## 2021-02-21 ENCOUNTER — Encounter: Payer: Self-pay | Admitting: Oncology

## 2021-02-24 ENCOUNTER — Telehealth: Payer: Medicaid Other | Admitting: Hospice and Palliative Medicine

## 2021-02-25 ENCOUNTER — Other Ambulatory Visit: Payer: Self-pay

## 2021-02-25 ENCOUNTER — Other Ambulatory Visit: Payer: Self-pay | Admitting: Internal Medicine

## 2021-02-25 ENCOUNTER — Inpatient Hospital Stay: Payer: Medicaid Other | Attending: Hospice and Palliative Medicine | Admitting: Hospice and Palliative Medicine

## 2021-02-25 DIAGNOSIS — R21 Rash and other nonspecific skin eruption: Secondary | ICD-10-CM | POA: Diagnosis not present

## 2021-02-25 DIAGNOSIS — H81399 Other peripheral vertigo, unspecified ear: Secondary | ICD-10-CM

## 2021-02-25 DIAGNOSIS — R110 Nausea: Secondary | ICD-10-CM

## 2021-02-25 DIAGNOSIS — G4700 Insomnia, unspecified: Secondary | ICD-10-CM

## 2021-02-25 MED ORDER — TRIAMCINOLONE ACETONIDE 0.5 % EX OINT: 1.0000 "application " | TOPICAL_OINTMENT | Freq: Two times a day (BID) | CUTANEOUS | 0 refills | Status: DC

## 2021-02-25 NOTE — Progress Notes (Signed)
Virtual Visit via Telephone Note  I connected with Leah [name] on 02/25/21 at 10:00 AM EST by telephone and verified that I am speaking with the correct person using two identifiers.  Location: Patient: Home Provider: Clinic   I discussed the limitations, risks, security and privacy concerns of performing an evaluation and management service by telephone and the availability of in person appointments. I also discussed with the patient that there may be a patient responsible charge related to this service. The patient expressed understanding and agreed to proceed.   History of Present Illness: Leah [name] is a 55 year old woman with history of stage Ia ER/PR positive HER2 negative invasive left breast cancer who is status post lumpectomy, adjuvant chemotherapy and radiation, and is currently on Arimidex.  Patient requested virtual visit for evaluation of 1 week itchy rash beneath her left breast.   Observations/Objective: Due to technical limitations, we spoke by phone instead of virtual [place] visit.  Patient describes having a small red, itchy area on her side beneath her left breast.  She describes it as red bumps with the largest being about dime sized.  She denies that they are fluid-filled or vesicular in appearance although she does characterize it as being clustered.  They are not painful but do itch.  No fever or chills.  No changes in characteristic to the breast or swelling or masses.  Patient says that she is taking Benadryl and using Bactroban cream and has had slow improvement in the rash over the past week.  Assessment and Plan: Rash -I do not think that this is related to her history of breast cancer or secondary to Arimidex.  The rash sounds atypical for shingles and patient would be outside the effective treatment window at this point.  It sounds like the rash is improving.  I agreed that we could try some steroid cream but that if the rash worsens or does not further  improve, I think she needs to be seen for further evaluation.  This likely would be best suited for follow-up by PCP.  Patient was in agreement with this plan.  Follow Up Instructions: As needed   I discussed the assessment and treatment plan with the patient. The patient was provided an opportunity to ask questions and all were answered. The patient agreed with the plan and demonstrated an understanding of the instructions.   The patient was advised to call back or seek an in-person evaluation if the symptoms worsen or if the condition fails to improve as anticipated.  I provided 10 minutes of non-face-to-face time during this encounter.   Irean Hong, NP

## 2021-03-03 DIAGNOSIS — M4802 Spinal stenosis, cervical region: Secondary | ICD-10-CM | POA: Diagnosis not present

## 2021-03-03 DIAGNOSIS — M5030 Other cervical disc degeneration, unspecified cervical region: Secondary | ICD-10-CM | POA: Diagnosis not present

## 2021-03-03 DIAGNOSIS — Z79899 Other long term (current) drug therapy: Secondary | ICD-10-CM | POA: Diagnosis not present

## 2021-03-03 DIAGNOSIS — M5412 Radiculopathy, cervical region: Secondary | ICD-10-CM | POA: Diagnosis not present

## 2021-03-16 ENCOUNTER — Other Ambulatory Visit: Payer: Self-pay | Admitting: Internal Medicine

## 2021-03-31 DIAGNOSIS — M4802 Spinal stenosis, cervical region: Secondary | ICD-10-CM | POA: Diagnosis not present

## 2021-03-31 DIAGNOSIS — M5412 Radiculopathy, cervical region: Secondary | ICD-10-CM | POA: Diagnosis not present

## 2021-04-04 ENCOUNTER — Other Ambulatory Visit: Payer: Self-pay | Admitting: Oncology

## 2021-04-10 ENCOUNTER — Other Ambulatory Visit: Payer: Self-pay | Admitting: Internal Medicine

## 2021-04-14 ENCOUNTER — Ambulatory Visit (INDEPENDENT_AMBULATORY_CARE_PROVIDER_SITE_OTHER): Payer: Medicaid Other | Admitting: Psychology

## 2021-04-14 DIAGNOSIS — F418 Other specified anxiety disorders: Secondary | ICD-10-CM | POA: Diagnosis not present

## 2021-04-14 NOTE — Progress Notes (Signed)
[place] Counselor/Therapist Progress Note  Patient ID: Leah [name], MRN: [id number]    Date: 04/14/21  Time Spent: 10:03  am - 10:55 am : 54 Minutes  Treatment Type: Individual Therapy.  Reported Symptoms: Anxiety, grief and loss.   Mental Status Exam: Appearance:  Fairly Groomed and Neat     Behavior: Appropriate  Motor: Normal  Speech/Language:  Clear and Coherent  Affect: Appropriate  Mood: sad  Thought process: normal  Thought content:   WNL  Sensory/Perceptual disturbances:   WNL  Orientation: oriented to person, place, time/date, and situation  Attention: Good  Concentration: Good  Memory: Leah [name]:  Good  Insight:   Good  Judgment:  Good  Impulse Control: Good   Risk Assessment: Danger to Self:  No Self-injurious Behavior: No Danger to Others: No Duty to Warn:no Physical Aggression / Violence:No  Access to Firearms a concern: No  Gang Involvement:No   Subjective:   Leah [name] Reason participated from home, via video, and consented to treatment. Therapist participated from home office. We met online due to Leah [name]. Leah [name] reviewed the events of the past week. Leah [name] noted numerous losses in the past six months including the sudden passing of her ex-husband and eldest dog. Leah [name] noted her attempts to grieve for the losses. We explored her coping, during the session, and the effect of the losses on her overall mood.  She discussed disturbed sleep, as a result of rumination , and noted getting ~ 4 hours of sleep per night. We reviewed her recent attempts to improve her sleep. We reviewed sleep hygiene. Therapist encouraged Leah [name] to continue to work on her sleep, between sessions, and engage in continued self-care. Therapist provided psycho-education regarding loss and provided supportive therapy. A follow-up was scheduled.    Interventions: Grief Therapy and sleep hygiene  Diagnosis:  Other specified anxiety  disorders  Treatment Plan:  Client Abilities/Strengths Leah [name] forthcoming and motivated for change.   Support System: Family and friends.  Client Treatment Preferences OPT  Client Statement of Needs Leah [name] would like to manage her symptoms, bolster coping skills, and process past events.   Treatment Level Weekly  Symptoms  Difficulty sleeping (5hrs) with middle insomnia, Anxiety (finances & health), feeling on edge, difficulty managing anxiety, worry about different things, mild irritability, feeling afraid something bad might happen. poor eating, decreased sleep, feeling bad about self, no panic symptoms, difficulty focusing, muscle tension, psychomotor retardation.   Anxiety: Difficulty sleeping (5hrs) with middle insomnia, Anxiety (finances & health), feeling on edge, difficulty managing anxiety, worry about different things, mild irritability, feeling afraid something bad might happen.   (Status: maintained) Depression: Poor eating, decreased sleep, feeling bad about self, no panic symptoms, difficulty focusing, muscle tension, psychomotor retardation.   (Status: maintained)  Goals:   Leah [name] experiences symptoms of anxiety and infrequent depressive symptoms.    Target Date: 08/10/21 Frequency: Weekly  Progress: 0 Modality: individual    Therapist will provide referrals for additional resources as appropriate.  Therapist will provide psycho-education regarding Leah [name]'s diagnosis and corresponding treatment approaches and interventions. Licensed Clinical Social Worker, [place], LCSW will support the patient's ability to achieve the goals identified. will employ CBT, BA, Problem-solving, Solution Focused, Mindfulness,  coping skills, & other evidenced-based practices will be used to promote progress towards healthy functioning to help manage decrease symptoms associated with her diagnosis.   Reduce overall level, frequency, and intensity of the feelings of depression, anxiety and  panic evidenced by decreased overall  symptoms from 6 to 7 days/week to 0 to 1 days/week per client report for at least 3 consecutive months. Verbally express understanding of the relationship between feelings of depression and  anxiety and their impact on thinking patterns and behaviors. Verbalize an understanding of the role that distorted thinking plays in creating fears, excessive worry, and ruminations.  Leah [name] participated in the creation of the treatment plan)  [name], LCSW

## 2021-04-19 DIAGNOSIS — M4802 Spinal stenosis, cervical region: Secondary | ICD-10-CM | POA: Diagnosis not present

## 2021-04-19 DIAGNOSIS — R0781 Pleurodynia: Secondary | ICD-10-CM | POA: Diagnosis not present

## 2021-04-19 DIAGNOSIS — M5030 Other cervical disc degeneration, unspecified cervical region: Secondary | ICD-10-CM | POA: Diagnosis not present

## 2021-04-19 DIAGNOSIS — M5412 Radiculopathy, cervical region: Secondary | ICD-10-CM | POA: Diagnosis not present

## 2021-05-05 ENCOUNTER — Telehealth: Payer: Self-pay | Admitting: Internal Medicine

## 2021-05-06 ENCOUNTER — Ambulatory Visit (INDEPENDENT_AMBULATORY_CARE_PROVIDER_SITE_OTHER): Payer: Medicaid Other | Admitting: Psychology

## 2021-05-06 DIAGNOSIS — F418 Other specified anxiety disorders: Secondary | ICD-10-CM

## 2021-05-06 NOTE — Progress Notes (Signed)
[place] Counselor/Therapist Progress Note  Patient ID: Leah [name], MRN: [id number]    Date: 05/06/21  Time Spent: 8:03 am - 8:55 am : 7 Minutes  Treatment Type: Individual Therapy.  Reported Symptoms: Anxiety, grief and loss.   Mental Status Exam: Appearance:  Fairly Groomed and Neat     Behavior: Appropriate  Motor: Normal  Speech/Language:  Clear and Coherent  Affect: Appropriate  Mood: sad  Thought process: normal  Thought content:   WNL  Sensory/Perceptual disturbances:   WNL  Orientation: oriented to person, place, time/date, and situation  Attention: Good  Concentration: Good  Memory: [place] of knowledge:  Good  Insight:   Good  Judgment:  Good  Impulse Control: Good   Risk Assessment: Danger to Self:  No Self-injurious Behavior: No Danger to Others: No Duty to Warn:no Physical Aggression / Violence:No  Access to Firearms a concern: No  Gang Involvement:No   Subjective:   [name] participated from home, via video, and consented to treatment. Therapist participated from home office. We met online due to [place] pandemic. Roselina reviewed the events of the past week.  She noted work related stressors and noted her attempts to navigate. 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 We explored this. Kaylaann noted a need to engage in more consistent self-care, specifically, exercise and improving sleep. Therapist employed BA principles, during the session, to identify reasonable goals, set a schedule, and identify barriers. Doria was engaged and motivated during the session. She expressed commitment towards our goals. Therapist provided psycho-education regarding loss and provided supportive therapy. A follow-up was scheduled.   Interventions:  Behavioral Activation  Diagnosis:  Other specified anxiety disorders  Treatment Plan:  Client Abilities/Strengths Coralee forthcoming and  motivated for change.   Support System: Family and friends.  Client Treatment Preferences OPT  Client Statement of Needs Fraya would like to manage her symptoms, bolster coping skills, and process past events.   Treatment Level Weekly  Symptoms  Anxiety: Difficulty sleeping (5hrs) with middle insomnia, Anxiety (finances & health), feeling on edge, difficulty managing anxiety, worry about different things, mild irritability, feeling afraid something bad might happen.   (Status: maintained)  Depression: Poor eating, decreased sleep, feeling bad about self, no panic symptoms, difficulty focusing, muscle tension, psychomotor retardation.   (Status: maintained)  Goals:   Yukari experiences symptoms of anxiety and infrequent depressive symptoms.    Target Date: 08/10/21 Frequency: Weekly  Progress: 0 Modality: individual    Therapist will provide referrals for additional resources as appropriate.  Therapist will provide psycho-education regarding Caliana's diagnosis and corresponding treatment approaches and interventions. Licensed Clinical Social Worker, [place], LCSW will support the patient's ability to achieve the goals identified. will employ CBT, BA, Problem-solving, Solution Focused, Mindfulness,  coping skills, & other evidenced-based practices will be used to promote progress towards healthy functioning to help manage decrease symptoms associated with her diagnosis.   Reduce overall level, frequency, and intensity of the feelings of depression, anxiety and panic evidenced by decreased overall symptoms from 6 to 7 days/week to 0 to 1 days/week per client report for at least 3 consecutive months. Verbally express understanding of the relationship between feelings of depression and  anxiety and their impact on thinking patterns and behaviors. Verbalize an understanding of the role that distorted thinking plays in creating fears, excessive worry, and ruminations.  [name] participated  in the creation of the treatment plan)  Alene Mires  Quasean Frye, LCSW

## 2021-05-13 NOTE — Telephone Encounter (Signed)
Pt need this medication sent to [facility] in [place] [address] ?

## 2021-05-14 NOTE — Telephone Encounter (Signed)
Left message to return call. ?Patient needing to schedule an appointment. Patient has not been seen in over a year. Please schedule Patient an appointment if calling back in.  ?

## 2021-05-18 ENCOUNTER — Other Ambulatory Visit: Payer: Self-pay

## 2021-05-18 ENCOUNTER — Ambulatory Visit (INDEPENDENT_AMBULATORY_CARE_PROVIDER_SITE_OTHER): Payer: Medicaid Other | Admitting: Internal Medicine

## 2021-05-18 ENCOUNTER — Encounter: Payer: Self-pay | Admitting: Internal Medicine

## 2021-05-18 VITALS — BP 130/84 | HR 92 | Temp 98.1°F | Ht 64.5 in | Wt 174.4 lb

## 2021-05-18 DIAGNOSIS — R928 Other abnormal and inconclusive findings on diagnostic imaging of breast: Secondary | ICD-10-CM

## 2021-05-18 DIAGNOSIS — E538 Deficiency of other specified B group vitamins: Secondary | ICD-10-CM

## 2021-05-18 DIAGNOSIS — Z1329 Encounter for screening for other suspected endocrine disorder: Secondary | ICD-10-CM

## 2021-05-18 DIAGNOSIS — Z0000 Encounter for general adult medical examination without abnormal findings: Secondary | ICD-10-CM | POA: Diagnosis not present

## 2021-05-18 DIAGNOSIS — Z1389 Encounter for screening for other disorder: Secondary | ICD-10-CM

## 2021-05-18 DIAGNOSIS — K219 Gastro-esophageal reflux disease without esophagitis: Secondary | ICD-10-CM

## 2021-05-18 DIAGNOSIS — Z853 Personal history of malignant neoplasm of breast: Secondary | ICD-10-CM

## 2021-05-18 DIAGNOSIS — R0981 Nasal congestion: Secondary | ICD-10-CM | POA: Diagnosis not present

## 2021-05-18 DIAGNOSIS — Z23 Encounter for immunization: Secondary | ICD-10-CM

## 2021-05-18 DIAGNOSIS — E559 Vitamin D deficiency, unspecified: Secondary | ICD-10-CM

## 2021-05-18 DIAGNOSIS — E785 Hyperlipidemia, unspecified: Secondary | ICD-10-CM

## 2021-05-18 DIAGNOSIS — A6000 Herpesviral infection of urogenital system, unspecified: Secondary | ICD-10-CM | POA: Diagnosis not present

## 2021-05-18 DIAGNOSIS — J0101 Acute recurrent maxillary sinusitis: Secondary | ICD-10-CM | POA: Diagnosis not present

## 2021-05-18 LAB — LIPID PANEL
Cholesterol: 305 mg/dL — ABNORMAL HIGH (ref 0–200)
HDL: 55.2 mg/dL (ref >39.00)
LDL Cholesterol: 212 mg/dL — ABNORMAL HIGH (ref 0–99)
NonHDL: 249.3
Total CHOL/HDL Ratio: 6
Triglycerides: 185 mg/dL — ABNORMAL HIGH (ref 0.0–149.0)
VLDL: 37 mg/dL (ref 0.0–40.0)

## 2021-05-18 LAB — T4, FREE: Free T4: 0.7 ng/dL (ref 0.60–1.60)

## 2021-05-18 LAB — TSH: TSH: 1.66 u[IU]/mL (ref 0.35–5.50)

## 2021-05-18 LAB — VITAMIN D 25 HYDROXY (VIT D DEFICIENCY, FRACTURES): VITD: 58.16 ng/mL (ref 30.00–100.00)

## 2021-05-18 LAB — VITAMIN B12: Vitamin B-12: 603 pg/mL (ref 211–911)

## 2021-05-18 MED ORDER — CETIRIZINE HCL 10 MG PO TABS: 10.0000 mg | ORAL_TABLET | Freq: Every day | ORAL | 3 refills | Status: DC | PRN

## 2021-05-18 MED ORDER — SALINE SPRAY 0.65 % NA SOLN: 2.0000 | Freq: Every day | NASAL | 11 refills | Status: DC | PRN

## 2021-05-18 MED ORDER — ATORVASTATIN CALCIUM 10 MG PO TABS: 10.0000 mg | ORAL_TABLET | Freq: Every day | ORAL | 3 refills | Status: DC

## 2021-05-18 MED ORDER — AZITHROMYCIN 250 MG PO TABS: ORAL_TABLET | ORAL | 0 refills | Status: AC

## 2021-05-18 MED ORDER — VALACYCLOVIR HCL 1 G PO TABS: 1000.0000 mg | ORAL_TABLET | Freq: Every day | ORAL | 3 refills | Status: DC

## 2021-05-18 MED ORDER — OMEPRAZOLE 40 MG PO CPDR: 40.0000 mg | DELAYED_RELEASE_CAPSULE | Freq: Every evening | ORAL | 3 refills | Status: DC

## 2021-05-18 MED ORDER — FLUTICASONE PROPIONATE 50 MCG/ACT NA SUSP: 2.0000 | Freq: Every day | NASAL | 12 refills | Status: DC

## 2021-05-18 NOTE — Telephone Encounter (Signed)
Patient seen in person today, 05/18/21 ?

## 2021-05-18 NOTE — Patient Instructions (Signed)
Zoster Vaccine, Recombinant injection ?What is this medication? ?ZOSTER VACCINE (ZOS ter vak SEEN) is a vaccine used to reduce the risk of getting shingles. This vaccine is not used to treat shingles or nerve pain from shingles. ?This medicine may be used for other purposes; ask your health care provider or pharmacist if you have questions. ?COMMON BRAND NAME(S): SHINGRIX ?What should I tell my care team before I take this medication? ?They need to know if you have any of these conditions: ?cancer ?immune system problems ?an unusual or allergic reaction to Zoster vaccine, other medications, foods, dyes, or preservatives ?pregnant or trying to get pregnant ?breast-feeding ?How should I use this medication? ?This vaccine is injected into a muscle. It is given by a health care provider. ?A copy of Vaccine Information Statements will be given before each vaccination. Be sure to read this information carefully each time. This sheet may change often. ?Talk to your health care provider about the use of this vaccine in children. This vaccine is not approved for use in children. ?Overdosage: If you think you have taken too much of this medicine contact a poison control center or emergency room at once. ?NOTE: This medicine is only for you. Do not share this medicine with others. ?What if I miss a dose? ?Keep appointments for follow-up (booster) doses. It is important not to miss your dose. Call your health care provider if you are unable to keep an appointment. ?What may interact with this medication? ?medicines that suppress your immune system ?medicines to treat cancer ?steroid medicines like prednisone or cortisone ?This list may not describe all possible interactions. Give your health care provider a list of all the medicines, herbs, non-prescription drugs, or dietary supplements you use. Also tell them if you smoke, drink alcohol, or use illegal drugs. Some items may interact with your medicine. ?What should I watch for  while using this medication? ?Visit your health care provider regularly. ?This vaccine, like all vaccines, may not fully protect everyone. ?What side effects may I notice from receiving this medication? ?Side effects that you should report to your doctor or health care professional as soon as possible: ?allergic reactions (skin rash, itching or hives; swelling of the face, lips, or tongue) ?trouble breathing ?Side effects that usually do not require medical attention (report these to your doctor or health care professional if they continue or are bothersome): ?chills ?headache ?fever ?nausea ?pain, redness, or irritation at site where injected ?tiredness ?vomiting ?This list may not describe all possible side effects. Call your doctor for medical advice about side effects. You may report side effects to FDA at 1-800-FDA-1088. ?Where should I keep my medication? ?This vaccine is only given by a health care provider. It will not be stored at home. ?NOTE: This sheet is a summary. It may not cover all possible information. If you have questions about this medicine, talk to your doctor, pharmacist, or health care provider. ?? 2022 Elsevier/Gold Standard (2020-11-17 00:00:00) ? ?

## 2021-05-18 NOTE — Progress Notes (Signed)
Chief Complaint  Patient presents with   Follow-up   Sinus Problem    Onset a month ago. Yellow congestion and face pain.    Annual Exam   Annual  1. Sinus pain and congestion nose and cheeks x 1 month and yellow discharge taking benadryl at night not using nose sprays  2. H/o breast cancer mammogram due 11/23/21  Review of Systems  Constitutional:  Negative for weight loss.  HENT:  Negative for hearing loss.   Eyes:  Negative for blurred vision.  Respiratory:  Negative for shortness of breath.   Cardiovascular:  Negative for chest pain.  Gastrointestinal:  Negative for abdominal pain and blood in stool.  Genitourinary:  Negative for dysuria.  Musculoskeletal:  Negative for falls and joint pain.  Skin:  Negative for rash.  Neurological:  Negative for headaches.  Psychiatric/Behavioral:  Negative for depression.   Past Medical History:  Diagnosis Date   Anxiety    Asthma    as a child   Breast cancer ([place])    Carpal tunnel syndrome    R hand    Colon polyps    Depression    Eosinophilic esophagitis    noted in [place] with GI path report 09/29/14    Esophageal stricture    s/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 Edwin Cap    Family history of breast cancer    Family history of prostate cancer    Herniated disc, cervical    s/p epidural injection   Herpes    History of kidney stones    Hyperlipidemia    Low back pain    Migraines    Personal history of chemotherapy    Personal history of radiation therapy    Past Surgical History:  Procedure Laterality Date   BREAST BIOPSY Left 12/25/2018   [place] BX, invasive mammary carcinoma    BREAST LUMPECTOMY     BREAST LUMPECTOMY WITH NEEDLE LOCALIZATION AND AXILLARY SENTINEL LYMPH NODE BX Left 01/16/2019   Procedure: LEFT BREAST LUMPECTOMY WITH NEEDLE LOCALIZATION AND SENTINEL LYMPH NODE BIOPSY;  Surgeon: Jovita Kussmaul, MD;  Location: [facility] ORS;  Service: General;  Laterality: Left;   CESAREAN SECTION      COLONOSCOPY WITH PROPOFOL N/A 03/27/2017   Procedure: COLONOSCOPY WITH PROPOFOL;  Surgeon: Jonathon Bellows, MD;  Location: [facility] ENDOSCOPY;  Service: Gastroenterology;  Laterality: N/A;   ESOPHAGOGASTRODUODENOSCOPY     ESOPHAGOGASTRODUODENOSCOPY (EGD) WITH PROPOFOL N/A 03/27/2017   Procedure: ESOPHAGOGASTRODUODENOSCOPY (EGD) WITH PROPOFOL WITH DILATION;  Surgeon: Jonathon Bellows, MD;  Location: [facility] ENDOSCOPY;  Service: Gastroenterology;  Laterality: N/A;   PORTACATH PLACEMENT N/A 02/15/2019   Procedure: INSERTION PORT-A-CATH WITH POSSIBLE ULTRASOUND;  Surgeon: Jovita Kussmaul, MD;  Location: [facility] ORS;  Service: General;  Laterality: N/A;   RE-EXCISION OF BREAST CANCER,SUPERIOR MARGINS Left 02/15/2019   Procedure: RE-EXCISION OF LEFT BREAST CANCER ANTERIOR MARGINS;  Surgeon: Jovita Kussmaul, MD;  Location: [facility] ORS;  Service: General;  Laterality: Left;   THROAT SURGERY  6256   UMBILICAL HERNIA REPAIR  2006    x 2   w mesh per pt   Family History  Problem Relation Age of Onset   Breast cancer Mother        dx late 27s   Thyroid disease Mother    Colon polyps Mother    Prostate cancer Father        dx 79   Breast cancer Maternal Grandmother        dx late  18s   Cancer Maternal Uncle        possibly, unsure type   Social History   Socioeconomic History   Marital status: Single    Spouse name: Not on file   Number of children: Not on file   Years of education: Not on file   Highest education level: Not on file  Occupational History   Not on file  Tobacco Use   Smoking status: Former    Packs/day: 1.00    Years: 20.00    Pack years: 20.00    Types: Cigarettes    Quit date: 06/02/2006    Years since quitting: 14.9   Smokeless tobacco: Never  Vaping Use   Vaping Use: Never used  Substance and Sexual Activity   Alcohol use: Not Currently   Drug use: No   Sexual activity: Yes    Partners: Male  Other Topics Concern   Not on file  Social History Narrative   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 As of 02/26/17    Divorced.    Former smoker 20+ years quit in 1997 then started again for 5 years and quit 10-12 years ago as of 03/08/17    Social Determinants of Radio broadcast assistant Strain: Not on file  Food Insecurity: Not on file  Transportation Needs: Not on file  Physical Activity: Not on file  Stress: Not on file  Social Connections: Not on file  Intimate Partner Violence: Not on file   Current Meds  Medication Sig   acetaminophen (TYLENOL) 500 MG tablet Take 500-1,000 mg by mouth every 6 (six) hours as needed (for pain.).   anastrozole (ARIMIDEX) 1 MG tablet TAKE 1 TABLET BY MOUTH EVERY DAY   azithromycin (ZITHROMAX) 250 MG tablet With food Take 2 tablets on day 1, then 1 tablet daily on days 2 through 5   cetirizine (ZYRTEC) 10 MG tablet Take 1 tablet (10 mg total) by mouth daily as needed for allergies.   Cholecalciferol (VITAMIN D-3) 125 MCG (5000 UT) TABS Take 5,000 Units by mouth daily.   Cyanocobalamin (VITAMIN B-12 PO) Take 1 tablet by mouth daily. 103m daily   DULoxetine (CYMBALTA) 60 MG capsule TAKE 1 CAPSULE BY MOUTH EVERY DAY   gabapentin (NEURONTIN) 300 MG capsule TAKE 1 CAPSULE BY MOUTH THREE TIMES A DAY   meclizine (ANTIVERT) 12.5 MG tablet TAKE 1 TABLET BY MOUTH 3 TIMES DAILY AS NEEDED FOR DIZZINESS.   meloxicam (MOBIC) 15 MG tablet Take 1 tablet (15 mg total) by mouth daily as needed for pain.   methocarbamol (ROBAXIN) 750 MG tablet Take 1 tablet (750 mg total) by mouth every 6 (six) hours as needed for muscle spasms.   sodium chloride (OCEAN) 0.65 % SOLN nasal spray Place 2 sprays into both nostrils daily as needed for congestion.   tiZANidine (ZANAFLEX) 4 MG tablet Take 4 mg by mouth at bedtime.   traMADol (ULTRAM) 50 MG tablet Take 1-2 tablets (50-100 mg total) by mouth every 6 (six) hours as needed.   traMADol (ULTRAM) 50 MG tablet Take by mouth.    traZODone (DESYREL) 50 MG tablet TAKE 1-1.5 TABLETS (50-75 MG TOTAL) BY MOUTH AT BEDTIME AS NEEDED FOR SLEEP.   [DISCONTINUED] atorvastatin (LIPITOR) 10 MG tablet TAKE 1 TABLET BY MOUTH EVERY DAY   [DISCONTINUED] omeprazole (PRILOSEC) 40 MG capsule TAKE 1 CAPSULE (  40 MG TOTAL) BY MOUTH EVERY EVENING.   [DISCONTINUED] valACYclovir (VALTREX) 1000 MG tablet Take 1 tablet (1,000 mg total) by mouth daily. Suppression. BID X 5 days with outbreak as needed   Allergies  Allergen Reactions   Hydrocodone-Acetaminophen Hives    All over her body.   Lactose Intolerance (Gi) Other (See Comments)    Bloating and GI upset   Penicillins Rash    Did it involve swelling of the face/tongue/throat, SOB, or low BP? Unknown Did it involve sudden or severe rash/hives, skin peeling, or any reaction on the inside of your mouth or nose? Yes Did you need to seek medical attention at a hospital or doctor's office? Unknown When did it last happen? Childhood reaction.      If all above answers are "NO", may proceed with cephalosporin use.    Sulfa Antibiotics Rash    Full body rash    No results found for this or any previous visit (from the past 2160 hour(s)). Objective  Body mass index is 29.47 kg/m. Wt Readings from Last 3 Encounters:  05/18/21 174 lb 6.4 oz (79.1 kg)  12/18/20 173 lb 4.8 oz (78.6 kg)  11/26/20 174 lb (78.9 kg)   Temp Readings from Last 3 Encounters:  05/18/21 98.1 F (36.7 C) (Oral)  12/18/20 (!) 97 F (36.1 C) (Tympanic)  11/26/20 98 F (36.7 C) (Tympanic)   BP Readings from Last 3 Encounters:  05/18/21 130/84  12/18/20 140/80  11/26/20 119/76   Pulse Readings from Last 3 Encounters:  05/18/21 92  12/18/20 90  11/26/20 80    Physical Exam Vitals and nursing note reviewed.  Constitutional:      Appearance: Normal appearance. She is well-developed and well-groomed.  HENT:     Head: Normocephalic and atraumatic.  Eyes:     Conjunctiva/sclera: Conjunctivae normal.      Pupils: Pupils are equal, round, and reactive to light.  Cardiovascular:     Rate and Rhythm: Normal rate and regular rhythm.     Heart sounds: Normal heart sounds. No murmur heard. Pulmonary:     Effort: Pulmonary effort is normal.     Breath sounds: Normal breath sounds.  Abdominal:     General: Abdomen is flat. Bowel sounds are normal.     Tenderness: There is no abdominal tenderness.  Musculoskeletal:        General: No tenderness.  Skin:    General: Skin is warm and dry.  Neurological:     General: No focal deficit present.     Mental Status: She is alert and oriented to person, place, and time. Mental status is at baseline.     Cranial Nerves: Cranial nerves 2-12 are intact.     Motor: Motor function is intact.     Coordination: Coordination is intact.     Gait: Gait is intact.  Psychiatric:        Attention and Perception: Attention and perception normal.        Mood and Affect: Mood and affect normal.        Speech: Speech normal.        Behavior: Behavior normal. Behavior is cooperative.        Thought Content: Thought content normal.        Cognition and Memory: Cognition and memory normal.        Judgment: Judgment normal.    Assessment  Plan  Annual physical exam See below   Acute recurrent maxillary sinusitis Zpack  Ns flonase  Prn zyrtec Nasal congestion - Plan: fluticasone (FLONASE) 50 MCG/ACT nasal spray  History of breast cancer - Plan: MM DIAG BREAST TOMO BILATERAL due 11/23/21 Abnormal mammogram - Plan: MM DIAG BREAST TOMO BILATERAL F/u Dr. Janese Banks  Vitamin B12 deficiency - Plan: Vitamin B12  Vitamin D deficiency - Plan: Vitamin D (25 hydroxy)   Need for shingles vaccine - Plan: Varicella-zoster vaccine IM (Shingrix)  Genital herpes simplex, unspecified site - Plan: valACYclovir (VALTREX) 1000 MG tablet   HM Had flu shot utd Tdap 03/13/18 rec hep B vaccine will call back to sch  rec MMR vaccine   covid 19 vaccine not had had covid 19 +  declines Shingrix 1/2 today   EGD/colonoscopy had 03/27/17 Dr. Bailey Mech diverticulosis, serrated polyps neg EGD for eosinophillic esophagitis repeat colonoscopy due in  5years due 03/2022 -refilled PPI  FH polyps in Mom   rec D3 5000 IU daily    mammo 10/10/17 negative FH breast cancer in mom and m gm in 3s. consider genetic testing in future  \mammogram had 10/12/18 needs left mammo and [place] had 12/18/18 abnormal + breast cancer/left   11/15/19 mammogram negative  H/o breast cancer  Neg 11/2020 ordered 11/2021   DEXA osteopenia T -1.2 calcium 600 mg 1-2 x per day and vitamin D3 5000   Pap 04/04/17 h/o abnormal neg pap neg HPV  Former smoker congratulated on quitting    NS saw 01/23/18 cervical spinal cord kyphosis, cervical myelopathy f/u in 4 months referral to pm&R for pain tx Dr. Remo Lipps cook     MRI 10/10/18 abnormal neurology does not think MS rec [facility]    [facility]S seen 01/04/19 stage 1 left breast cancer ER+ Dr. Marlou Starks breast conservation surgery and sentinel node bx f/u with h/o after surgery    CC Dr. Manuella Ghazi to see when due to f/u neurology    rec healthy diet and exercise  Provider: Dr. Olivia Mackie McLean-Scocuzza-Internal Medicine

## 2021-05-19 LAB — MICROSCOPIC MESSAGE

## 2021-05-19 LAB — URINALYSIS, ROUTINE W REFLEX MICROSCOPIC
Bacteria, UA: NONE SEEN /HPF
Bilirubin Urine: NEGATIVE
Glucose, UA: NEGATIVE
Hgb urine dipstick: NEGATIVE
Hyaline Cast: NONE SEEN /LPF
Ketones, ur: NEGATIVE
Nitrite: NEGATIVE
Protein, ur: NEGATIVE
RBC / HPF: NONE SEEN /HPF (ref 0–2)
Specific Gravity, Urine: 1.008 (ref 1.001–1.035)
Squamous Epithelial / HPF: NONE SEEN /HPF (ref <=5)
pH: 5.5 (ref 5.0–8.0)

## 2021-05-24 ENCOUNTER — Other Ambulatory Visit: Payer: Self-pay

## 2021-05-24 DIAGNOSIS — E785 Hyperlipidemia, unspecified: Secondary | ICD-10-CM

## 2021-05-24 MED ORDER — ATORVASTATIN CALCIUM 40 MG PO TABS: 40.0000 mg | ORAL_TABLET | Freq: Every day | ORAL | 0 refills | Status: DC

## 2021-05-26 ENCOUNTER — Inpatient Hospital Stay: Payer: Medicaid Other

## 2021-05-26 ENCOUNTER — Encounter: Payer: Self-pay | Admitting: Oncology

## 2021-05-26 ENCOUNTER — Inpatient Hospital Stay: Payer: Medicaid Other | Attending: Hospice and Palliative Medicine

## 2021-05-26 ENCOUNTER — Inpatient Hospital Stay (HOSPITAL_BASED_OUTPATIENT_CLINIC_OR_DEPARTMENT_OTHER): Payer: Medicaid Other | Admitting: Oncology

## 2021-05-26 ENCOUNTER — Other Ambulatory Visit: Payer: Self-pay

## 2021-05-26 VITALS — BP 139/90 | HR 89 | Temp 96.8°F | Wt 175.0 lb

## 2021-05-26 DIAGNOSIS — G629 Polyneuropathy, unspecified: Secondary | ICD-10-CM | POA: Diagnosis not present

## 2021-05-26 DIAGNOSIS — Z87891 Personal history of nicotine dependence: Secondary | ICD-10-CM | POA: Insufficient documentation

## 2021-05-26 DIAGNOSIS — Z8719 Personal history of other diseases of the digestive system: Secondary | ICD-10-CM | POA: Insufficient documentation

## 2021-05-26 DIAGNOSIS — Z79811 Long term (current) use of aromatase inhibitors: Secondary | ICD-10-CM

## 2021-05-26 DIAGNOSIS — C50212 Malignant neoplasm of upper-inner quadrant of left female breast: Secondary | ICD-10-CM

## 2021-05-26 DIAGNOSIS — Z923 Personal history of irradiation: Secondary | ICD-10-CM | POA: Diagnosis not present

## 2021-05-26 DIAGNOSIS — Z79899 Other long term (current) drug therapy: Secondary | ICD-10-CM | POA: Insufficient documentation

## 2021-05-26 DIAGNOSIS — Z170 Estrogen receptor positive status [ER+]: Secondary | ICD-10-CM | POA: Insufficient documentation

## 2021-05-26 DIAGNOSIS — Z803 Family history of malignant neoplasm of breast: Secondary | ICD-10-CM | POA: Insufficient documentation

## 2021-05-26 DIAGNOSIS — F419 Anxiety disorder, unspecified: Secondary | ICD-10-CM | POA: Diagnosis not present

## 2021-05-26 DIAGNOSIS — Z8042 Family history of malignant neoplasm of prostate: Secondary | ICD-10-CM | POA: Insufficient documentation

## 2021-05-26 DIAGNOSIS — Z9221 Personal history of antineoplastic chemotherapy: Secondary | ICD-10-CM | POA: Insufficient documentation

## 2021-05-26 DIAGNOSIS — R5383 Other fatigue: Secondary | ICD-10-CM | POA: Diagnosis not present

## 2021-05-26 LAB — CBC WITH DIFFERENTIAL/PLATELET
Abs Immature Granulocytes: 0.01 10*3/uL (ref 0.00–0.07)
Basophils Absolute: 0.1 10*3/uL (ref 0.0–0.1)
Basophils Relative: 1 %
Eosinophils Absolute: 0.5 10*3/uL (ref 0.0–0.5)
Eosinophils Relative: 9 %
HCT: 43.5 % (ref 36.0–46.0)
Hemoglobin: 15.3 g/dL — ABNORMAL HIGH (ref 12.0–15.0)
Immature Granulocytes: 0 %
Lymphocytes Relative: 24 %
Lymphs Abs: 1.4 10*3/uL (ref 0.7–4.0)
MCH: 32.1 pg (ref 26.0–34.0)
MCHC: 35.2 g/dL (ref 30.0–36.0)
MCV: 91.2 fL (ref 80.0–100.0)
Monocytes Absolute: 0.6 10*3/uL (ref 0.1–1.0)
Monocytes Relative: 10 %
Neutro Abs: 3.3 10*3/uL (ref 1.7–7.7)
Neutrophils Relative %: 56 %
Platelets: 361 10*3/uL (ref 150–400)
RBC: 4.77 MIL/uL (ref 3.87–5.11)
RDW: 11.6 % (ref 11.5–15.5)
WBC: 6 10*3/uL (ref 4.0–10.5)
nRBC: 0 % (ref 0.0–0.2)

## 2021-05-26 LAB — COMPREHENSIVE METABOLIC PANEL
ALT: 17 U/L (ref 0–44)
AST: 21 U/L (ref 15–41)
Albumin: 4 g/dL (ref 3.5–5.0)
Alkaline Phosphatase: 53 U/L (ref 38–126)
Anion gap: 6 (ref 5–15)
BUN: 18 mg/dL (ref 6–20)
CO2: 27 mmol/L (ref 22–32)
Calcium: 9.1 mg/dL (ref 8.9–10.3)
Chloride: 102 mmol/L (ref 98–111)
Creatinine, Ser: 0.71 mg/dL (ref 0.44–1.00)
GFR, Estimated: >60 mL/min (ref >60)
Glucose, Bld: 110 mg/dL — ABNORMAL HIGH (ref 70–99)
Potassium: 4.2 mmol/L (ref 3.5–5.1)
Sodium: 135 mmol/L (ref 135–145)
Total Bilirubin: 0.8 mg/dL (ref 0.3–1.2)
Total Protein: 7.3 g/dL (ref 6.5–8.1)

## 2021-05-26 MED ORDER — ZOLEDRONIC ACID 4 MG/100ML IV SOLN
4.0000 mg | INTRAVENOUS | Status: DC
  Administered 2021-05-26: 4 mg via INTRAVENOUS
  Filled 2021-05-26: qty 100

## 2021-05-26 MED ORDER — SODIUM CHLORIDE 0.9 % IV SOLN
Freq: Once | INTRAVENOUS | Status: AC
  Filled 2021-05-26: qty 250

## 2021-05-26 NOTE — Progress Notes (Signed)
? ? ? ?Hematology/Oncology Consult note ?[place]  ?[phone] [phone] ? ?Patient Care Team: ?McLean-Scocuzza, Nino Glow, MD as PCP - General (Internal Medicine) ?Jovita Kussmaul, MD as Consulting Physician (General Surgery) ?Noreene Filbert, MD as Referring Physician (Radiation Oncology) ?Rico Junker, RN as Registered Nurse ?Sindy Guadeloupe, MD as Consulting Physician (Oncology)  ? ?Name of the patient: Leah [name]  ?[id number]  ?[birth year]  ? ?Date of visit: 05/26/21 ? ?Diagnosis-  ?pathological prognostic stage Ia invasive mammary carcinoma pT1b pN1 cM0 ER/PR positive HER-2/neu negative s/p lumpectomy, adjuvant chemotherapy and radiation currently on Arimidex ? ? ?Chief complaint/ Reason for visit-routine follow-up of breast cancer on Arimidex ? ? ?Heme/Onc history: Patient is a 56 year old postmenopausal female G2 P2 L2 who recently underwent screening bilateral mammogram which showed area of concern in her left breast.  This was followed by an ultrasound and Core biopsy.  Ultrasound showed 1 x 1.1 x 0.7 cm hypoechoic mass at the 10:30 position of the left breast.  Left axilla appeared normal.  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  ?  ?Final pathology showed 9 mm grade 1 invasive mammary carcinoma 1 out of 2 lymph nodes involved with macro metastatic carcinoma 3.5 mm.  Anterior margin was focally positive for which patient underwent reexcision surgery with negative margins [id number] (sn) ?  ?MammaPrint score came back as high risk with a risk of recurrence of 29% at 10 years without chemotherapy ?  ?Patient completed 4 cycles of dose dense AC chemotherapy and 12 cycles of weekly Taxol.  She was started on letrozole in August 2021 but could not tolerate it due to diarrhea and is currently on Arimidex ? ?Interval history-reports overall feeling well.  Tolerating Arimidex well.  Neuropathy is  stable.  She continues Cymbalta.  Joint pain is improved since working with OT back in September 2022.  Her 17 year old son still lives with her after unexpected death of his father.  They recently moved into a new apartment.  She is working full-time and is now Chiropodist which requires more hours.  No new lumps or bumps.  ? ?ECOG PS- 0 ?Pain scale- 0 ? ? ?Review of systems- Review of Systems  ?Constitutional:  Positive for malaise/fatigue. Negative for chills, fever and weight loss.  ?HENT:  Negative for congestion, ear pain and tinnitus.   ?Eyes: Negative.  Negative for blurred vision and double vision.  ?Respiratory: Negative.  Negative for cough, sputum production and shortness of breath.   ?Cardiovascular: Negative.  Negative for chest pain, palpitations and leg swelling.  ?Gastrointestinal: Negative.  Negative for abdominal pain, constipation, diarrhea, nausea and vomiting.  ?Genitourinary:  Negative for dysuria, frequency and urgency.  ?Musculoskeletal:  Positive for joint pain. Negative for back pain and falls.  ?Skin: Negative.  Negative for rash.  ?Neurological:  Positive for dizziness. Negative for weakness and headaches.  ?Endo/Heme/Allergies: Negative.  Does not bruise/bleed easily.  ?Psychiatric/Behavioral: Negative.  Negative for depression. The patient is not nervous/anxious and does not have insomnia.    ? ? ?Allergies  ?Allergen Reactions  ? Hydrocodone-Acetaminophen Hives  ?  All over her body.  ? Lactose Intolerance (Gi) Other (See Comments)  ?  Bloating and GI upset  ? Penicillins Rash  ?  Did it involve swelling of the face/tongue/throat, SOB, or low BP? Unknown ?Did it involve sudden or severe rash/hives, skin peeling, or any reaction on the inside  of your mouth or nose? Yes ?Did you need to seek medical attention at a hospital or doctor's office? Unknown ?When did it last happen? Childhood reaction.      ?If all above answers are "NO", may proceed with cephalosporin use. ?  ? Sulfa  Antibiotics Rash  ?  Full body rash ?  ? ? ? ?Past Medical History:  ?Diagnosis Date  ? Anxiety   ? Asthma   ? as a child  ? Breast cancer ([place])   ? Carpal tunnel syndrome   ? R hand   ? Colon polyps   ? Depression   ? Eosinophilic esophagitis   ? noted in [place] with GI path report 09/29/14   ? Esophageal stricture   ? s/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 Edwin Cap   ? Family history of breast cancer   ? Family history of prostate cancer   ? Herniated disc, cervical   ? s/p epidural injection  ? Herpes   ? History of kidney stones   ? Hyperlipidemia   ? Low back pain   ? Migraines   ? Personal history of chemotherapy   ? Personal history of radiation therapy   ? ? ? ?Past Surgical History:  ?Procedure Laterality Date  ? BREAST BIOPSY Left 12/25/2018  ? [place] BX, invasive mammary carcinoma   ? BREAST LUMPECTOMY    ? BREAST LUMPECTOMY WITH NEEDLE LOCALIZATION AND AXILLARY SENTINEL LYMPH NODE BX Left 01/16/2019  ? Procedure: LEFT BREAST LUMPECTOMY WITH NEEDLE LOCALIZATION AND SENTINEL LYMPH NODE BIOPSY;  Surgeon: Jovita Kussmaul, MD;  Location: [facility] ORS;  Service: General;  Laterality: Left;  ? CESAREAN SECTION    ? COLONOSCOPY WITH PROPOFOL N/A 03/27/2017  ? Procedure: COLONOSCOPY WITH PROPOFOL;  Surgeon: Jonathon Bellows, MD;  Location: [facility] ENDOSCOPY;  Service: Gastroenterology;  Laterality: N/A;  ? ESOPHAGOGASTRODUODENOSCOPY    ? ESOPHAGOGASTRODUODENOSCOPY (EGD) WITH PROPOFOL N/A 03/27/2017  ? Procedure: ESOPHAGOGASTRODUODENOSCOPY (EGD) WITH PROPOFOL WITH DILATION;  Surgeon: Jonathon Bellows, MD;  Location: [facility] ENDOSCOPY;  Service: Gastroenterology;  Laterality: N/A;  ? PORTACATH PLACEMENT N/A 02/15/2019  ? Procedure: INSERTION PORT-A-CATH WITH POSSIBLE ULTRASOUND;  Surgeon: Jovita Kussmaul, MD;  Location: [facility] ORS;  Service: General;  Laterality: N/A;  ? RE-EXCISION OF BREAST CANCER,SUPERIOR MARGINS Left 02/15/2019  ? Procedure: RE-EXCISION OF LEFT BREAST CANCER ANTERIOR MARGINS;  Surgeon: Jovita Kussmaul, MD;   Location: [facility] ORS;  Service: General;  Laterality: Left;  ? THROAT SURGERY  2015  ? UMBILICAL HERNIA REPAIR  2006  ?  x 2   w mesh per pt  ? ? ?Social History  ? ?Socioeconomic History  ? Marital status: Single  ?  Spouse name: Not on file  ? Number of children: Not on file  ? Years of education: Not on file  ? Highest education level: Not on file  ?Occupational History  ? Not on file  ?Tobacco Use  ? Smoking status: Former  ?  Packs/day: 1.00  ?  Years: 20.00  ?  Pack years: 20.00  ?  Types: Cigarettes  ?  Quit date: 06/02/2006  ?  Years since quitting: 14.9  ? Smokeless tobacco: Never  ?Vaping Use  ? Vaping Use: Never used  ?Substance and Sexual Activity  ? Alcohol use: Not Currently  ? Drug use: No  ? Sexual activity: Yes  ?  Partners: Male  ?Other Topics Concern  ? Not on file  ?Social History Narrative  ? 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 2 kids 10 and 41 son and daughter live in [place]. As of 02/26/17   ? Divorced.   ? Former smoker 20+ years quit in 1997 then started again for 5 years and quit 10-12 years ago as of 03/08/17   ? ?Social Determinants of Health  ? ?Financial Resource Strain: Not on file  ?Food Insecurity: Not on file  ?Transportation Needs: Not on file  ?Physical Activity: Not on file  ?Stress: Not on file  ?Social Connections: Not on file  ?Intimate Partner Violence: Not on file  ? ? ?Family History  ?Problem Relation Age of Onset  ? Breast cancer Mother   ?     dx late 63s  ? Thyroid disease Mother   ? Colon polyps Mother   ? Prostate cancer Father   ?     dx 34  ? Breast cancer Maternal Grandmother   ?     dx late 67s  ? Cancer Maternal Uncle   ?     possibly, unsure type  ? ? ? ?Current Outpatient Medications:  ?  acetaminophen (TYLENOL) 500 MG tablet, Take 500-1,000 mg by mouth every 6 (six) hours as needed (for pain.)., Disp: , Rfl:  ?  anastrozole (ARIMIDEX) 1 MG tablet, TAKE 1 TABLET BY MOUTH EVERY DAY, Disp: 90 tablet, Rfl: 2 ?   atorvastatin (LIPITOR) 40 MG tablet, Take 1 tablet (40 mg total) by mouth daily., Disp: 90 tablet, Rfl: 0 ?  cetirizine (ZYRTEC) 10 MG tablet, Take 1 tablet (10 mg total) by mouth daily as needed for allergies.,

## 2021-06-03 ENCOUNTER — Ambulatory Visit (INDEPENDENT_AMBULATORY_CARE_PROVIDER_SITE_OTHER): Payer: Medicaid Other | Admitting: Psychology

## 2021-06-03 DIAGNOSIS — F418 Other specified anxiety disorders: Secondary | ICD-10-CM | POA: Diagnosis not present

## 2021-06-03 NOTE — Progress Notes (Signed)
[place] Counselor/Therapist Progress Note ? ?Patient ID: Leah [name], MRN: [id number]   ? ?Date: 06/03/21 ? ?Time Spent: 8:03 am - 8:56 am : 53 Minutes ? ?Treatment Type: Individual Therapy. ? ?Reported Symptoms: Anxiety, grief and loss.  ? ?Mental Status Exam: ?Appearance:  Fairly Groomed and Neat     ?Behavior: Appropriate  ?Motor: Normal  ?Speech/Language:  Clear and Coherent  ?Affect: Appropriate  ?Mood: sad  ?Thought process: normal  ?Thought content:   WNL  ?Sensory/Perceptual disturbances:   WNL  ?Orientation: oriented to person, place, time/date, and situation  ?Attention: Good  ?Concentration: Good  ?Memory: WNL  ?Fund of knowledge:  Good  ?Insight:   Good  ?Judgment:  Good  ?Impulse Control: Good  ? ?Risk Assessment: ?Danger to Self:  No ?Self-injurious Behavior: No ?Danger to Others: No ?Duty to Warn:no ?Physical Aggression / Violence:No  ?Access to Firearms a concern: No  ?Gang Involvement:No 8 ? ?Subjective:  ? ?[name] participated from home, via video, and consented to treatment. Therapist participated from home office. We met online due to [place] pandemic. Edwin reviewed the events of the past week.  She noted work related stressors and noted trying not to "get chewed out". Trysta noted feeling stressed out at work as a result. We explored this during the session and her attempts to manage this stress. She noted difficulty with sleep due to the time-change. She noted her continued grief regarding her dog who passed unexpectedly. We continued to process her grief and sadness, during the session. Chantal noted her difficulty engaging in exercise due to motivation. We explored this and her attempts to engage more consistently. Therapist employed BA principles, during the session, to set reasonable expectations and to address barriers and self-talk. She expressed commitment towards our goals. Therapist provided psycho-education regarding loss and provided supportive therapy.  A follow-up was scheduled.  ? ?Interventions: Grief Therapy and Behavioral Activation ? ?Diagnosis:  ?Other specified anxiety disorders ? ?Treatment Plan: ? ?Client Abilities/Strengths ?Rahi forthcoming and motivated for change.  ? ?Support System: ?Family and friends. ? ?Client Treatment Preferences ?OPT ? ?Client Statement of Needs ?Marnette would like to manage her symptoms, bolster coping skills, and process past events.  ? ?Treatment Level ?Weekly ? ?Symptoms ? ?Anxiety: Difficulty sleeping (5hrs) with middle insomnia, Anxiety (finances & health), feeling on edge, difficulty managing anxiety, worry about different things, mild irritability, feeling afraid something bad might happen.   (Status: maintained) ? ?Depression: Poor eating, decreased sleep, feeling bad about self, no panic symptoms, difficulty focusing, muscle tension, psychomotor retardation.   (Status: maintained) ? ?Goals:  ? ?Brenleigh experiences symptoms of anxiety and infrequent depressive symptoms.  ? ? ?Target Date: 08/10/21 Frequency: Weekly  ?Progress: 0 Modality: individual  ? ? ?Therapist will provide referrals for additional resources as appropriate.  ?Therapist will provide psycho-education regarding Shaton's diagnosis and corresponding treatment approaches and interventions. ?Licensed Clinical Social Worker, [name], LCSW will support the patient's ability to achieve the goals identified. will employ CBT, BA, Problem-solving, Solution Focused, Mindfulness,  coping skills, & other evidenced-based practices will be used to promote progress towards healthy functioning to help manage decrease symptoms associated with her diagnosis.  ? Reduce overall level, frequency, and intensity of the feelings of depression, anxiety and panic evidenced by decreased overall symptoms from 6 to 7 days/week to 0 to 1 days/week per client report for at least 3 consecutive months. ?Verbally express understanding of the relationship between feelings of depression and   anxiety  and their impact on thinking patterns and behaviors. ?Verbalize an understanding of the role that distorted thinking plays in creating fears, excessive worry, and ruminations. ? ?(Bricelyn participated in the creation of the treatment plan) ? ?[name], LCSW ?

## 2021-07-05 ENCOUNTER — Ambulatory Visit (INDEPENDENT_AMBULATORY_CARE_PROVIDER_SITE_OTHER): Payer: Medicaid Other | Admitting: Psychology

## 2021-07-05 DIAGNOSIS — F418 Other specified anxiety disorders: Secondary | ICD-10-CM | POA: Diagnosis not present

## 2021-07-05 NOTE — Progress Notes (Signed)
[place] Counselor/Therapist Progress Note ? ?Patient ID: Leah [name], MRN: [id number]   ? ?Date: 07/05/21 ? ?Time Spent: 8:01 am -  8:58 am : 57 Minutes ? ?Treatment Type: Individual Therapy. ? ?Reported Symptoms: Anxiety, grief and loss.  ? ?Mental Status Exam: ?Appearance:  Neat and Well Groomed     ?Behavior: Appropriate  ?Motor: Normal  ?Speech/Language:  Clear and Coherent  ?Affect: Appropriate  ?Mood: normal  ?Thought process: normal  ?Thought content:   WNL  ?Sensory/Perceptual disturbances:   WNL  ?Orientation: oriented to person, place, time/date, and situation  ?Attention: Good  ?Concentration: Good  ?Memory: WNL  ?Fund of knowledge:  Good  ?Insight:   Good  ?Judgment:  Good  ?Impulse Control: Good  ? ?Risk Assessment: ?Danger to Self:  No ?Self-injurious Behavior: No ?Danger to Others: No ?Duty to Warn:no ?Physical Aggression / Violence:No  ?Access to Firearms a concern: No  ?Gang Involvement:No 8 ? ?Subjective:  ? ?Leah [name] Reason participated from home, via video, and consented to treatment. Therapist participated from home office. We met online due to [place] pandemic. Leah [name] reviewed the events of the past week. Leah [name]  discussed getting a new family dog, Bell, and was excited about the new addition. Leah [name] noted interpersonal stress with her friend and feeling disconnected from due to her friend's grief while she, Leah [name], was dealing with her own grief. She noted disturbed sleep due to her new dog, who is a puppy, due to being high energy, at night. We worked on processing this transition, during the session.She endorsed recent high anxiety and "weird" dreams.  She is continuing to set boundaries with her son, who recently moved to [place] to live with her. Therapist praised Leah [name] for her effort and energy during the session and encouraged continued self-care including exercise and engagement in hobbies.  Therapist encouraged Leah [name] to identify small and reasonable goals.  We discussed  ways to engage in small activities that support a good mood employing the behavioral activation approach.  Leah [name] was engaged and motivated.  Therapist validated and normalized Leah [name]'s feelings and provided supportive therapy. A follow-up was scheduled.  ? ?Interventions: Interpersonal and behavioral activation ? ?Diagnosis:  ?Other specified anxiety disorders ? ?Treatment Plan: ? ?Client Abilities/Strengths ?[name] forthcoming and motivated for change.  ? ?Support System: ?Family and friends. ? ?Client Treatment Preferences ?OPT ? ?Client Statement of Needs ?Leah [name] would like to manage her symptoms, bolster coping skills, and process past events.  ? ?Treatment Level ?Weekly ? ?Symptoms ? ?Anxiety: Difficulty sleeping (5hrs) with middle insomnia, Anxiety (finances & health), feeling on edge, difficulty managing anxiety, worry about different things, mild irritability, feeling afraid something bad might happen.   (Status: maintained) ? ?Depression: Poor eating, decreased sleep, feeling bad about self, no panic symptoms, difficulty focusing, muscle tension, psychomotor retardation.   (Status: maintained) ? ?Goals:  ? ?Leah [name] experiences symptoms of anxiety and infrequent depressive symptoms.  ? ? ?Target Date: 08/10/21 Frequency: Weekly  ?Progress: 0 Modality: individual  ? ? ?Therapist will provide referrals for additional resources as appropriate.  ?Therapist will provide psycho-education regarding [name]'s diagnosis and corresponding treatment approaches and interventions. ?Licensed Clinical Social Worker, [name], LCSW will support the patient's ability to achieve the goals identified. will employ CBT, BA, Problem-solving, Solution Focused, Mindfulness,  coping skills, & other evidenced-based practices will be used to promote progress towards healthy functioning to help manage decrease symptoms associated with her diagnosis.  ? Reduce overall level, frequency, and intensity of the feelings  of depression, anxiety and  panic evidenced by decreased overall symptoms from 6 to 7 days/week to 0 to 1 days/week per client report for at least 3 consecutive months. ?Verbally express understanding of the relationship between feelings of depression and  anxiety and their impact on thinking patterns and behaviors. ?Verbalize an understanding of the role that distorted thinking plays in creating fears, excessive worry, and ruminations. ? ?(Leah [name] participated in the creation of the treatment plan) ? ?[name], LCSW ?

## 2021-07-08 ENCOUNTER — Encounter: Payer: Self-pay | Admitting: Oncology

## 2021-07-12 ENCOUNTER — Other Ambulatory Visit: Payer: Self-pay | Admitting: Oncology

## 2021-07-18 ENCOUNTER — Other Ambulatory Visit: Payer: Self-pay | Admitting: *Deleted

## 2021-07-19 ENCOUNTER — Other Ambulatory Visit: Payer: Self-pay

## 2021-07-19 MED ORDER — GABAPENTIN 300 MG PO CAPS: ORAL_CAPSULE | ORAL | 2 refills | Status: DC

## 2021-07-21 ENCOUNTER — Encounter: Payer: Self-pay | Admitting: Oncology

## 2021-08-09 ENCOUNTER — Ambulatory Visit (INDEPENDENT_AMBULATORY_CARE_PROVIDER_SITE_OTHER): Payer: Medicaid Other | Admitting: Psychology

## 2021-08-09 DIAGNOSIS — F418 Other specified anxiety disorders: Secondary | ICD-10-CM

## 2021-08-09 NOTE — Progress Notes (Signed)
[place] Counselor/Therapist Progress Note  Patient ID: Leah [name], MRN: [id number]    Date: 08/09/21  Time Spent: 8:01 am -  9:02 am : 61 Minutes  Treatment Type: Individual Therapy.  Reported Symptoms: Anxiety, grief and loss.   Mental Status Exam: Appearance:  Neat and Well Groomed     Behavior: Appropriate  Motor: Normal  Speech/Language:  Clear and Coherent  Affect: Appropriate  Mood: normal  Thought process: normal  Thought content:   WNL  Sensory/Perceptual disturbances:   WNL  Orientation: oriented to person, place, time/date, and situation  Attention: Good  Concentration: Good  Memory: [place] of knowledge:  Good  Insight:   Good  Judgment:  Good  Impulse Control: Good   Risk Assessment: Danger to Self:  No Self-injurious Behavior: No Danger to Others: No Duty to Warn:no Physical Aggression / Violence:No  Access to Firearms a concern: No  Gang Involvement:No 8  Subjective:   [name] participated from home, via video, and consented to treatment. Therapist participated from home office. We met online due to [place] pandemic. Azalee reviewed the events of the past week. She noted continued financial stressors and her efforts to address these concerns. She noted increased stressors and the effect on her and her son, who lives with her. 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 She noted her attempts to manage these interpersonal work related stressors. We explored this during the session and the effect it has on Jess's mood. We reviewed her coping skills during the session and therapist reinforced continued self-care. She noted mildly strained relationship relationship with her friend, [place]. We explored this during the session and her feelings regarding addressing these concerns. Vedika was engaged and motivated.  Therapist validated and normalized Bentlee's  feelings and provided supportive therapy. A follow-up was scheduled.   Interventions: Interpersonal  Diagnosis:  Other specified anxiety disorders  Treatment Plan:  Client Abilities/Strengths Schwanda forthcoming and motivated for change.   Support System: Family and friends.  Client Treatment Preferences OPT  Client Statement of Needs Harjot would like to manage her symptoms, bolster coping skills, and process past events.   Treatment Level Weekly  Symptoms  Anxiety: Difficulty sleeping (5hrs) with middle insomnia, Anxiety (finances & health), feeling on edge, difficulty managing anxiety, worry about different things, mild irritability, feeling afraid something bad might happen.   (Status: maintained)  Depression: Poor eating, decreased sleep, feeling bad about self, no panic symptoms, difficulty focusing, muscle tension, psychomotor retardation.   (Status: maintained)  Goals:   Cherolyn experiences symptoms of anxiety and infrequent depressive symptoms.    Target Date: 08/10/21 Frequency: Weekly  Progress: 0 Modality: individual    Therapist will provide referrals for additional resources as appropriate.  Therapist will provide psycho-education regarding Kynlei's diagnosis and corresponding treatment approaches and interventions. Licensed Clinical Social Worker, [place], LCSW will support the patient's ability to achieve the goals identified. will employ CBT, BA, Problem-solving, Solution Focused, Mindfulness,  coping skills, & other evidenced-based practices will be used to promote progress towards healthy functioning to help manage decrease symptoms associated with her diagnosis.   Reduce overall level, frequency, and intensity of the feelings of depression, anxiety and panic evidenced by decreased overall symptoms from 6 to 7 days/week to 0 to 1 days/week per client report for at least 3 consecutive months. Verbally express understanding of the relationship between feelings  of  depression and  anxiety and their impact on thinking patterns and behaviors. Verbalize an understanding of the role that distorted thinking plays in creating fears, excessive worry, and ruminations.  [name] participated in the creation of the treatment plan)  [name], LCSW

## 2021-08-12 ENCOUNTER — Other Ambulatory Visit: Payer: Self-pay | Admitting: Oncology

## 2021-08-20 ENCOUNTER — Ambulatory Visit (INDEPENDENT_AMBULATORY_CARE_PROVIDER_SITE_OTHER): Payer: Medicaid Other | Admitting: Internal Medicine

## 2021-08-20 ENCOUNTER — Other Ambulatory Visit (HOSPITAL_COMMUNITY)
Admission: RE | Admit: 2021-08-20 | Discharge: 2021-08-20 | Disposition: A | Discharge status: Home / Self Care | Payer: Medicaid Other | Source: Ambulatory Visit | Attending: Internal Medicine | Admitting: Internal Medicine

## 2021-08-20 ENCOUNTER — Ambulatory Visit (INDEPENDENT_AMBULATORY_CARE_PROVIDER_SITE_OTHER): Payer: Medicaid Other

## 2021-08-20 ENCOUNTER — Encounter: Payer: Self-pay | Admitting: Internal Medicine

## 2021-08-20 VITALS — BP 120/70 | HR 89 | Temp 98.3°F | Resp 14 | Ht 64.5 in | Wt 176.6 lb

## 2021-08-20 DIAGNOSIS — R739 Hyperglycemia, unspecified: Secondary | ICD-10-CM

## 2021-08-20 DIAGNOSIS — M25561 Pain in right knee: Secondary | ICD-10-CM

## 2021-08-20 DIAGNOSIS — C50912 Malignant neoplasm of unspecified site of left female breast: Secondary | ICD-10-CM | POA: Diagnosis not present

## 2021-08-20 DIAGNOSIS — N898 Other specified noninflammatory disorders of vagina: Secondary | ICD-10-CM | POA: Diagnosis not present

## 2021-08-20 DIAGNOSIS — Z23 Encounter for immunization: Secondary | ICD-10-CM | POA: Diagnosis not present

## 2021-08-20 DIAGNOSIS — M62838 Other muscle spasm: Secondary | ICD-10-CM | POA: Diagnosis not present

## 2021-08-20 DIAGNOSIS — M79671 Pain in right foot: Secondary | ICD-10-CM

## 2021-08-20 DIAGNOSIS — Z124 Encounter for screening for malignant neoplasm of cervix: Secondary | ICD-10-CM

## 2021-08-20 DIAGNOSIS — E785 Hyperlipidemia, unspecified: Secondary | ICD-10-CM | POA: Diagnosis not present

## 2021-08-20 DIAGNOSIS — J9801 Acute bronchospasm: Secondary | ICD-10-CM | POA: Diagnosis not present

## 2021-08-20 DIAGNOSIS — R87610 Atypical squamous cells of undetermined significance on cytologic smear of cervix (ASC-US): Secondary | ICD-10-CM

## 2021-08-20 DIAGNOSIS — M1711 Unilateral primary osteoarthritis, right knee: Secondary | ICD-10-CM | POA: Diagnosis not present

## 2021-08-20 DIAGNOSIS — R87810 Cervical high risk human papillomavirus (HPV) DNA test positive: Secondary | ICD-10-CM

## 2021-08-20 DIAGNOSIS — H81399 Other peripheral vertigo, unspecified ear: Secondary | ICD-10-CM | POA: Diagnosis not present

## 2021-08-20 DIAGNOSIS — G4700 Insomnia, unspecified: Secondary | ICD-10-CM

## 2021-08-20 DIAGNOSIS — R110 Nausea: Secondary | ICD-10-CM | POA: Diagnosis not present

## 2021-08-20 LAB — LIPID PANEL
Cholesterol: 261 mg/dL — ABNORMAL HIGH (ref 0–200)
HDL: 63.6 mg/dL (ref >39.00)
LDL Cholesterol: 159 mg/dL — ABNORMAL HIGH (ref 0–99)
NonHDL: 197.25
Total CHOL/HDL Ratio: 4
Triglycerides: 192 mg/dL — ABNORMAL HIGH (ref 0.0–149.0)
VLDL: 38.4 mg/dL (ref 0.0–40.0)

## 2021-08-20 LAB — HEMOGLOBIN A1C: Hgb A1c MFr Bld: 5.7 % (ref 4.6–6.5)

## 2021-08-20 MED ORDER — METHOCARBAMOL 750 MG PO TABS: 750.0000 mg | ORAL_TABLET | Freq: Four times a day (QID) | ORAL | 5 refills | Status: DC | PRN

## 2021-08-20 MED ORDER — MELOXICAM 15 MG PO TABS
15.0000 mg | ORAL_TABLET | Freq: Every day | ORAL | 3 refills | Status: DC | PRN
Start: 2021-08-20 — End: 2022-07-01

## 2021-08-20 MED ORDER — ALBUTEROL SULFATE HFA 108 (90 BASE) MCG/ACT IN AERS: 2.0000 | INHALATION_SPRAY | Freq: Four times a day (QID) | RESPIRATORY_TRACT | 11 refills | Status: DC | PRN

## 2021-08-20 MED ORDER — ATORVASTATIN CALCIUM 40 MG PO TABS: 40.0000 mg | ORAL_TABLET | Freq: Every day | ORAL | 3 refills | Status: DC

## 2021-08-20 MED ORDER — TRAZODONE HCL 50 MG PO TABS: 50.0000 mg | ORAL_TABLET | Freq: Every evening | ORAL | 11 refills | Status: DC | PRN

## 2021-08-20 MED ORDER — MECLIZINE HCL 12.5 MG PO TABS: ORAL_TABLET | ORAL | 3 refills | Status: DC

## 2021-08-20 NOTE — Patient Instructions (Signed)
Foot Pain Many things can cause foot pain. Some common causes are: An injury. A sprain. Arthritis. Blisters. Bunions. Follow these instructions at home: Managing pain, stiffness, and swelling If directed, put ice on the painful area: Put ice in a plastic bag. Place a towel between your skin and the bag. Leave the ice on for 20 minutes, 2-3 times a day.  Activity Do not stand or walk for long periods. Return to your normal activities as told by your health care provider. Ask your health care provider what activities are safe for you. Do stretches to relieve foot pain and stiffness as told by your health care provider. Do not lift anything that is heavier than 10 lb (4.5 kg), or the limit that you are told, until your health care provider says that it is safe. Lifting a lot of weight can put added pressure on your feet. Lifestyle Wear comfortable, supportive shoes that fit you well. Do not wear high heels. Keep your feet clean and dry. General instructions Take over-the-counter and prescription medicines only as told by your health care provider. Rub your foot gently. Pay attention to any changes in your symptoms. Keep all follow-up visits as told by your health care provider. This is important. Contact a health care provider if: Your pain does not get better after a few days of self-care. Your pain gets worse. You cannot stand on your foot. Get help right away if: Your foot is numb or tingling. Your foot or toes are swollen. Your foot or toes turn white or blue. You have warmth and redness along your foot. Summary Common causes of foot pain are injury, sprain, arthritis, blisters, or bunions. Ice, medicines, and comfortable shoes may help foot pain. Contact your health care provider if your pain does not get better after a few days of self-care. This information is not intended to replace advice given to you by your health care provider. Make sure you discuss any questions you  have with your health care provider. Document Revised: 06/03/2020 Document Reviewed: 06/03/2020 [employer] Patient Education  [place].  Knee Exercises Ask your health care provider which exercises are safe for you. Do exercises exactly as told by your health care provider and adjust them as directed. It is normal to feel mild stretching, pulling, tightness, or discomfort as you do these exercises. Stop right away if you feel sudden pain or your pain gets worse. Do not begin these exercises until told by your health care provider. Stretching and range-of-motion exercises These exercises warm up your muscles and joints and improve the movement and flexibility of your knee. These exercises also help to relieve pain and swelling. Knee extension, prone  Lie on your abdomen (prone position) on a bed. Place your left / right knee just beyond the edge of the surface so your knee is not on the bed. You can put a towel under your left / right thigh just above your kneecap for comfort. Relax your leg muscles and allow gravity to straighten your knee (extension). You should feel a stretch behind your left / right knee. Hold this position for __________ seconds. Scoot up so your knee is supported between repetitions. Repeat __________ times. Complete this exercise __________ times a day. Knee flexion, active  Lie on your back with both legs straight. If this causes back discomfort, bend your left / right knee so your foot is flat on the floor. Slowly slide your left / right heel back toward your buttocks. Stop when  you feel a gentle stretch in the front of your knee or thigh (flexion). Hold this position for __________ seconds. Slowly slide your left / right heel back to the starting position. Repeat __________ times. Complete this exercise __________ times a day. Quadriceps stretch, prone  Lie on your abdomen on a firm surface, such as a bed or padded floor. Bend your left / right knee and  hold your ankle. If you cannot reach your ankle or pant leg, loop a belt around your foot and grab the belt instead. Gently pull your heel toward your buttocks. Your knee should not slide out to the side. You should feel a stretch in the front of your thigh and knee (quadriceps). Hold this position for __________ seconds. Repeat __________ times. Complete this exercise __________ times a day. Hamstring, supine  Lie on your back (supine position). Loop a belt or towel over the ball of your left / right foot. The ball of your foot is on the walking surface, right under your toes. Straighten your left / right knee and slowly pull on the belt to raise your leg until you feel a gentle stretch behind your knee (hamstring). Do not let your knee bend while you do this. Keep your other leg flat on the floor. Hold this position for __________ seconds. Repeat __________ times. Complete this exercise __________ times a day. Strengthening exercises These exercises build strength and endurance in your knee. Endurance is the ability to use your muscles for a long time, even after they get tired. Quadriceps, isometric This exercise strengthens the muscles in front of your thigh (quadriceps) without moving your knee joint (isometric). Lie on your back with your left / right leg extended and your other knee bent. Put a rolled towel or small pillow under your knee if told by your health care provider. Slowly tense the muscles in the front of your left / right thigh. You should see your kneecap slide up toward your hip or see increased dimpling just above the knee. This motion will push the back of the knee toward the floor. For __________ seconds, hold the muscle as tight as you can without increasing your pain. Relax the muscles slowly and completely. Repeat __________ times. Complete this exercise __________ times a day. Straight leg raises This exercise strengthens the muscles in front of your thigh  (quadriceps) and the muscles that move your hips (hip flexors). Lie on your back with your left / right leg extended and your other knee bent. Tense the muscles in the front of your left / right thigh. You should see your kneecap slide up or see increased dimpling just above the knee. Your thigh may even shake a bit. Keep these muscles tight as you raise your leg 4-6 inches (10-15 cm) off the floor. Do not let your knee bend. Hold this position for __________ seconds. Keep these muscles tense as you lower your leg. Relax your muscles slowly and completely after each repetition. Repeat __________ times. Complete this exercise __________ times a day. Hamstring, isometric  Lie on your back on a firm surface. Bend your left / right knee about __________ degrees. Dig your left / right heel into the surface as if you are trying to pull it toward your buttocks. Tighten the muscles in the back of your thighs (hamstring) to "dig" as hard as you can without increasing any pain. Hold this position for __________ seconds. Release the tension gradually and allow your muscles to relax completely for __________ seconds  after each repetition. Repeat __________ times. Complete this exercise __________ times a day. Hamstring curls If told by your health care provider, do this exercise while wearing ankle weights. Begin with __________lb / kg weights. Then increase the weight by 1 lb (0.5 kg) increments. Do not wear ankle weights that are more than __________lb / kg. Lie on your abdomen with your legs straight. Bend your left / right knee as far as you can without feeling pain. Keep your hips flat against the floor. Hold this position for __________ seconds. Slowly lower your leg to the starting position. Repeat __________ times. Complete this exercise __________ times a day. Squats This exercise strengthens the muscles in front of your thigh and knee (quadriceps). Stand in front of a table, with your feet and  knees pointing straight ahead. You may rest your hands on the table for balance but not for support. Slowly bend your knees and lower your hips like you are going to sit in a chair. Keep your weight over your heels, not over your toes. Keep your lower legs upright so they are parallel with the table legs. Do not let your hips go lower than your knees. Do not bend lower than told by your health care provider. If your knee pain increases, do not bend as low. Hold the squat position for __________ seconds. Slowly push with your legs to return to standing. Do not use your hands to pull yourself to standing. Repeat __________ times. Complete this exercise __________ times a day. Wall slides This exercise strengthens the muscles in front of your thigh and knee (quadriceps). Lean your back against a smooth wall or door, and walk your feet out 18-24 inches (46-61 cm) from it. Place your feet hip-width apart. Slowly slide down the wall or door until your knees bend __________ degrees. Keep your knees over your heels, not over your toes. Keep your knees in line with your hips. Hold this position for __________ seconds. Repeat __________ times. Complete this exercise __________ times a day. Straight leg raises, side-lying This exercise strengthens the muscles that rotate the leg at the hip and move it away from your body (hip abductors). Lie on your side with your left / right leg in the top position. Lie so your head, shoulder, knee, and hip line up. You may bend your bottom knee to help you keep your balance. Roll your hips slightly forward so your hips are stacked directly over each other and your left / right knee is facing forward. Leading with your heel, lift your top leg 4-6 inches (10-15 cm). You should feel the muscles in your outer hip lifting. Do not let your foot drift forward. Do not let your knee roll toward the ceiling. Hold this position for __________ seconds. Slowly return your leg to  the starting position. Let your muscles relax completely after each repetition. Repeat __________ times. Complete this exercise __________ times a day. Straight leg raises, prone This exercise stretches the muscles that move your hips away from the front of the pelvis (hip extensors). Lie on your abdomen on a firm surface. You can put a pillow under your hips if that is more comfortable. Tense the muscles in your buttocks and lift your left / right leg about 4-6 inches (10-15 cm). Keep your knee straight as you lift your leg. Hold this position for __________ seconds. Slowly lower your leg to the starting position. Let your leg relax completely after each repetition. Repeat __________ times. Complete this exercise  __________ times a day. This information is not intended to replace advice given to you by your health care provider. Make sure you discuss any questions you have with your health care provider. Document Revised: 11/10/2020 Document Reviewed: 11/10/2020 [employer] Patient Education  [place].

## 2021-08-20 NOTE — Addendum Note (Signed)
Addended by: Orland Mustard on: 08/20/2021 11:48 AM   Modules accepted: Orders

## 2021-08-20 NOTE — Progress Notes (Signed)
Chief Complaint  Patient presents with   Annual Exam    Labs 05/18/21, due for pap today and 2nd shingles shot. Pt c/o R knee and foot pain, knee ongoing for months and food pain just recently started   F/u  1. Right knee pain and right foot pain had fall x 2 on right knee and has 8/10 pain tried topical icey hot and right foot mid foot pain jumped down from a brick wall  2. C/o wheezing taking zyrtec and benadryl and helps she is not a smoker  3. Pap today  4. 2nd shingrix today     Review of Systems  Constitutional:  Negative for weight loss.  HENT:  Negative for hearing loss.   Eyes:  Negative for blurred vision.  Respiratory:  Negative for shortness of breath.   Cardiovascular:  Negative for chest pain.  Gastrointestinal:  Negative for abdominal pain and blood in stool.  Genitourinary:  Negative for dysuria.  Musculoskeletal:  Negative for falls and joint pain.  Skin:  Negative for rash.  Neurological:  Negative for headaches.  Psychiatric/Behavioral:  Negative for depression.    Past Medical History:  Diagnosis Date   Anxiety    Asthma    as a child   Breast cancer ([place])    Carpal tunnel syndrome    R hand    Colon polyps    Depression    Eosinophilic esophagitis    noted in [place] with GI path report 09/29/14    Esophageal stricture    s/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 Edwin Cap    Family history of breast cancer    Family history of prostate cancer    Herniated disc, cervical    s/p epidural injection   Herpes    History of kidney stones    Hyperlipidemia    Low back pain    Migraines    Personal history of chemotherapy    Personal history of radiation therapy    Past Surgical History:  Procedure Laterality Date   BREAST BIOPSY Left 12/25/2018   [place] BX, invasive mammary carcinoma    BREAST LUMPECTOMY     BREAST LUMPECTOMY WITH NEEDLE LOCALIZATION AND AXILLARY SENTINEL LYMPH NODE BX Left 01/16/2019   Procedure: LEFT BREAST  LUMPECTOMY WITH NEEDLE LOCALIZATION AND SENTINEL LYMPH NODE BIOPSY;  Surgeon: Jovita Kussmaul, MD;  Location: [facility] ORS;  Service: General;  Laterality: Left;   CESAREAN SECTION     COLONOSCOPY WITH PROPOFOL N/A 03/27/2017   Procedure: COLONOSCOPY WITH PROPOFOL;  Surgeon: Jonathon Bellows, MD;  Location: [facility] ENDOSCOPY;  Service: Gastroenterology;  Laterality: N/A;   ESOPHAGOGASTRODUODENOSCOPY     ESOPHAGOGASTRODUODENOSCOPY (EGD) WITH PROPOFOL N/A 03/27/2017   Procedure: ESOPHAGOGASTRODUODENOSCOPY (EGD) WITH PROPOFOL WITH DILATION;  Surgeon: Jonathon Bellows, MD;  Location: [facility] ENDOSCOPY;  Service: Gastroenterology;  Laterality: N/A;   PORTACATH PLACEMENT N/A 02/15/2019   Procedure: INSERTION PORT-A-CATH WITH POSSIBLE ULTRASOUND;  Surgeon: Jovita Kussmaul, MD;  Location: [facility] ORS;  Service: General;  Laterality: N/A;   RE-EXCISION OF BREAST CANCER,SUPERIOR MARGINS Left 02/15/2019   Procedure: RE-EXCISION OF LEFT BREAST CANCER ANTERIOR MARGINS;  Surgeon: Jovita Kussmaul, MD;  Location: [facility] ORS;  Service: General;  Laterality: Left;   THROAT SURGERY  9741   UMBILICAL HERNIA REPAIR  2006    x 2   w mesh per pt   Family History  Problem Relation Age of Onset   Breast cancer Mother        dx  late 4s   Thyroid disease Mother    Colon polyps Mother    Prostate cancer Father        dx 63   Breast cancer Maternal Grandmother        dx late 80s   Cancer Maternal Uncle        possibly, unsure type   Social History   Socioeconomic History   Marital status: Single    Spouse name: Not on file   Number of children: Not on file   Years of education: Not on file   Highest education level: Not on file  Occupational History   Not on file  Tobacco Use   Smoking status: Former    Packs/day: 1.00    Years: 20.00    Total pack years: 20.00    Types: Cigarettes    Quit date: 06/02/2006    Years since quitting: 15.2   Smokeless tobacco: Never  Vaping Use   Vaping Use: Never used  Substance and Sexual  Activity   Alcohol use: Not Currently   Drug use: No   Sexual activity: Yes    Partners: Male  Other Topics Concern   Not on file  Social History Narrative   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 As of 02/26/17    Divorced.    Former smoker 20+ years quit in 1997 then started again for 5 years and quit 10-12 years ago as of 03/08/17    Social Determinants of Radio broadcast assistant Strain: Not on file  Food Insecurity: Not on file  Transportation Needs: Not on file  Physical Activity: Not on file  Stress: Not on file  Social Connections: Not on file  Intimate Partner Violence: Not on file   Current Meds  Medication Sig   acetaminophen (TYLENOL) 500 MG tablet Take 500-1,000 mg by mouth every 6 (six) hours as needed (for pain.).   albuterol (VENTOLIN HFA) 108 (90 Base) MCG/ACT inhaler Inhale 2 puffs into the lungs every 6 (six) hours as needed for wheezing or shortness of breath.   anastrozole (ARIMIDEX) 1 MG tablet TAKE 1 TABLET BY MOUTH EVERY DAY   cetirizine (ZYRTEC) 10 MG tablet Take 1 tablet (10 mg total) by mouth daily as needed for allergies.   Cholecalciferol (VITAMIN D-3) 125 MCG (5000 UT) TABS Take 5,000 Units by mouth daily.   Cyanocobalamin (VITAMIN B-12 PO) Take 1 tablet by mouth daily. 1076m daily   DULoxetine (CYMBALTA) 60 MG capsule TAKE 1 CAPSULE BY MOUTH EVERY DAY   fluticasone (FLONASE) 50 MCG/ACT nasal spray Place 2 sprays into both nostrils daily. Max 2 sprays   gabapentin (NEURONTIN) 300 MG capsule TAKE 1 CAPSULE BY MOUTH THREE TIMES A DAY   omeprazole (PRILOSEC) 40 MG capsule Take 1 capsule (40 mg total) by mouth every evening. 30 min before food   sodium chloride (OCEAN) 0.65 % SOLN nasal spray Place 2 sprays into both nostrils daily as needed for congestion.   tiZANidine (ZANAFLEX) 4 MG tablet Take 4 mg by mouth at bedtime.   traMADol (ULTRAM) 50 MG tablet Take  1-2 tablets (50-100 mg total) by mouth every 6 (six) hours as needed.   traMADol (ULTRAM) 50 MG tablet Take by mouth.   valACYclovir (VALTREX) 1000 MG tablet Take 1 tablet (1,000 mg total) by mouth daily. Suppression. BID X 5 days with outbreak  as needed   [DISCONTINUED] atorvastatin (LIPITOR) 40 MG tablet Take 1 tablet (40 mg total) by mouth daily.   [DISCONTINUED] meclizine (ANTIVERT) 12.5 MG tablet TAKE 1 TABLET BY MOUTH 3 TIMES DAILY AS NEEDED FOR DIZZINESS.   [DISCONTINUED] meloxicam (MOBIC) 15 MG tablet Take 1 tablet (15 mg total) by mouth daily as needed for pain.   [DISCONTINUED] methocarbamol (ROBAXIN) 750 MG tablet Take 1 tablet (750 mg total) by mouth every 6 (six) hours as needed for muscle spasms.   [DISCONTINUED] traZODone (DESYREL) 50 MG tablet TAKE 1-1.5 TABLETS (50-75 MG TOTAL) BY MOUTH AT BEDTIME AS NEEDED FOR SLEEP.   Allergies  Allergen Reactions   Hydrocodone-Acetaminophen Hives    All over her body.   Lactose Intolerance (Gi) Other (See Comments)    Bloating and GI upset   Penicillins Rash    Did it involve swelling of the face/tongue/throat, SOB, or low BP? Unknown Did it involve sudden or severe rash/hives, skin peeling, or any reaction on the inside of your mouth or nose? Yes Did you need to seek medical attention at a hospital or doctor's office? Unknown When did it last happen? Childhood reaction.      If all above answers are "NO", may proceed with cephalosporin use.    Sulfa Antibiotics Rash    Full body rash    Recent Results (from the past 2160 hour(s))  Hemoglobin A1c     Status: None   Collection Time: 08/20/21  9:10 AM  Result Value Ref Range   Hgb A1c MFr Bld 5.7 4.6 - 6.5 %    Comment: Glycemic Control Guidelines for People with Diabetes:Non Diabetic:  <6%Goal of Therapy: <7%Additional Action Suggested:  >8%   Lipid panel     Status: Abnormal   Collection Time: 08/20/21  9:10 AM  Result Value Ref Range   Cholesterol 261 (H) 0 - 200 mg/dL     Comment: ATP III Classification       Desirable:  < 200 mg/dL               Borderline High:  200 - 239 mg/dL          High:  > = 240 mg/dL   Triglycerides 192.0 (H) 0.0 - 149.0 mg/dL    Comment: Normal:  <150 mg/dLBorderline High:  150 - 199 mg/dL   HDL 63.60 >39.00 mg/dL   VLDL 38.4 0.0 - 40.0 mg/dL   LDL Cholesterol 159 (H) 0 - 99 mg/dL   Total CHOL/HDL Ratio 4     Comment:                Men          Women1/2 Average Risk     3.4          3.3Average Risk          5.0          4.42X Average Risk          9.6          7.13X Average Risk          15.0          11.0                       NonHDL 197.25     Comment: NOTE:  Non-HDL goal should be 30 mg/dL higher than patient's LDL goal (i.e. LDL goal of < 70 mg/dL, would have non-HDL goal of <  100 mg/dL)   Objective  Body mass index is 29.85 kg/m. Wt Readings from Last 3 Encounters:  08/20/21 176 lb 9.6 oz (80.1 kg)  05/26/21 175 lb (79.4 kg)  05/18/21 174 lb 6.4 oz (79.1 kg)   Temp Readings from Last 3 Encounters:  08/20/21 98.3 F (36.8 C) (Oral)  05/26/21 (!) 96.8 F (36 C) (Tympanic)  05/18/21 98.1 F (36.7 C) (Oral)   BP Readings from Last 3 Encounters:  08/20/21 120/70  05/26/21 139/90  05/18/21 130/84   Pulse Readings from Last 3 Encounters:  08/20/21 89  05/26/21 89  05/18/21 92    Physical Exam Vitals and nursing note reviewed.  Constitutional:      Appearance: Normal appearance. She is well-developed and well-groomed.  HENT:     Head: Normocephalic and atraumatic.  Eyes:     Conjunctiva/sclera: Conjunctivae normal.     Pupils: Pupils are equal, round, and reactive to light.  Cardiovascular:     Rate and Rhythm: Normal rate and regular rhythm.     Heart sounds: Normal heart sounds. No murmur heard. Pulmonary:     Effort: Pulmonary effort is normal.     Breath sounds: Normal breath sounds.  Chest:  Breasts:    Breasts are symmetrical.     Right: Normal.     Left: Normal.  Abdominal:     General: Abdomen  is flat. Bowel sounds are normal.     Tenderness: There is no abdominal tenderness.  Genitourinary:    Exam position: Supine.     Pubic Area: No rash.      Labia:        Right: No rash.        Left: No rash.      Vagina: Normal.     Cervix: Discharge present.     Uterus: Normal.      Adnexa: Right adnexa normal and left adnexa normal.    Musculoskeletal:        General: No tenderness.  Lymphadenopathy:     Upper Body:     Right upper body: No axillary adenopathy.     Left upper body: No axillary adenopathy.  Skin:    General: Skin is warm and dry.  Neurological:     General: No focal deficit present.     Mental Status: She is alert and oriented to person, place, and time. Mental status is at baseline.     Cranial Nerves: Cranial nerves 2-12 are intact.     Motor: Motor function is intact.     Coordination: Coordination is intact.     Gait: Gait is intact.  Psychiatric:        Attention and Perception: Attention and perception normal.        Mood and Affect: Mood and affect normal.        Speech: Speech normal.        Behavior: Behavior normal. Behavior is cooperative.        Thought Content: Thought content normal.        Cognition and Memory: Cognition and memory normal.        Judgment: Judgment normal.     Assessment  Plan  Acute pain of right knee - Plan: DG Knee Complete 4 Views Right Rx knee brace  FINDINGS: No acute fracture or dislocation identified. Mild narrowing of the patellofemoral joint and medial compartment joint space, with small marginal osteophytes. No joint effusion visualized.   IMPRESSION: Mild degenerative changes of the right knee.  Electronically Signed   By: Ofilia Neas M.D.   On: 08/20/2021 15:20  Refer to [facility]   Right foot pain - Plan: DG Foot Complete Right Consider ortho or podiatry  FINDINGS: Frontal, oblique, and lateral views of the right foot are obtained. No acute displaced fracture, subluxation, or  dislocation. 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 Small inferior calcaneal spur. Soft tissues are unremarkable.   IMPRESSION: 1. 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 2. Small inferior calcaneal spur.     Electronically Signed   By: Randa Ngo M.D.  Hyperglycemia - Plan: Hemoglobin A1c  Hyperlipidemia, unspecified hyperlipidemia type - Plan: Lipid panel  Malignant neoplasm of left female breast, unspecified estrogen receptor status, unspecified site of breast ([place]) - Plan: MM DIAG BREAST TOMO BILATERAL Due 11/2021   Routine cervical smear - Plan: Cytology - PAP( [place])  Bronchospasm - Plan: albuterol (VENTOLIN HFA) 108 (90 Base) MCG/ACT inhaler Cont zyrtec and prn benadryl   Vaginal lesion - Plan: Ambulatory referral to Obstetrics / Gynecology     HM Had flu shot utd Tdap 03/13/18 rec hep B vaccine will call back to sch  rec MMR vaccine   covid 19 vaccine not had had covid 19 + declines Shingrix 2/2 utd   EGD/colonoscopy had 03/27/17 Dr. Bailey Mech diverticulosis, serrated polyps neg EGD for eosinophillic esophagitis repeat colonoscopy due in  5years due 03/2022 -refilled PPI  FH polyps in Mom    rec D3 5000 IU daily    mammo 10/10/17 negative FH breast cancer in mom and m gm in 54s. consider genetic testing in future  \mammogram had 10/12/18 needs left mammo and [place] had 12/18/18 abnormal + breast cancer/left   11/15/19 mammogram negative  H/o breast cancer  Neg 11/2020 ordered 11/2021 diagnostic    DEXA osteopenia T -1.2 calcium 600 mg 1-2 x per day and vitamin D3 5000 11/09/21   Pap 04/04/17 h/o abnormal neg pap neg HPV   Former smoker congratulated on quitting    NS saw 01/23/18 cervical spinal cord kyphosis, cervical myelopathy f/u in 4 months referral to pm&R for pain tx Dr. Remo Lipps cook     MRI 10/10/18 abnormal neurology does not think MS rec [facility]    CCS seen 01/04/19 stage 1 left breast cancer  ER+ Dr. Marlou Starks breast conservation surgery and sentinel node bx f/u with h/o after surgery    CC Dr. Manuella Ghazi to see when due to f/u neurology    rec healthy diet and exercise  Provider: Dr. Olivia Mackie McLean-[facility]oc[facility]

## 2021-08-22 ENCOUNTER — Encounter: Payer: Self-pay | Admitting: Internal Medicine

## 2021-08-22 DIAGNOSIS — R7303 Prediabetes: Secondary | ICD-10-CM | POA: Insufficient documentation

## 2021-08-24 ENCOUNTER — Encounter: Payer: Self-pay | Admitting: Internal Medicine

## 2021-08-24 DIAGNOSIS — R87610 Atypical squamous cells of undetermined significance on cytologic smear of cervix (ASC-US): Secondary | ICD-10-CM | POA: Insufficient documentation

## 2021-08-24 LAB — CYTOLOGY - PAP
Comment: NEGATIVE
Diagnosis: UNDETERMINED — AB
High risk HPV: POSITIVE — AB

## 2021-08-24 NOTE — Addendum Note (Signed)
Addended by: Orland Mustard on: 08/24/2021 12:34 PM   Modules accepted: Orders

## 2021-08-25 DIAGNOSIS — R87810 Cervical high risk human papillomavirus (HPV) DNA test positive: Secondary | ICD-10-CM | POA: Diagnosis not present

## 2021-08-25 DIAGNOSIS — R87610 Atypical squamous cells of undetermined significance on cytologic smear of cervix (ASC-US): Secondary | ICD-10-CM | POA: Diagnosis not present

## 2021-08-25 DIAGNOSIS — N843 Polyp of vulva: Secondary | ICD-10-CM | POA: Diagnosis not present

## 2021-08-25 NOTE — Addendum Note (Signed)
Addended by: Orland Mustard on: 08/25/2021 12:17 PM   Modules accepted: Orders

## 2021-08-27 ENCOUNTER — Telehealth: Payer: Self-pay | Admitting: Internal Medicine

## 2021-08-27 NOTE — Telephone Encounter (Signed)
Patient has an appointment with [facility] next Friday, 09/03/2021. Patient is requesting a CD from rt knee and  rt foot. Please call when ready, [phone].

## 2021-08-27 NOTE — Telephone Encounter (Signed)
CD placed up front for pick up & pt notified

## 2021-09-03 DIAGNOSIS — M179 Osteoarthritis of knee, unspecified: Secondary | ICD-10-CM | POA: Insufficient documentation

## 2021-09-03 DIAGNOSIS — M5412 Radiculopathy, cervical region: Secondary | ICD-10-CM | POA: Diagnosis not present

## 2021-09-03 DIAGNOSIS — M4802 Spinal stenosis, cervical region: Secondary | ICD-10-CM | POA: Diagnosis not present

## 2021-09-03 DIAGNOSIS — M1711 Unilateral primary osteoarthritis, right knee: Secondary | ICD-10-CM | POA: Diagnosis not present

## 2021-09-14 ENCOUNTER — Ambulatory Visit (INDEPENDENT_AMBULATORY_CARE_PROVIDER_SITE_OTHER): Payer: Medicaid Other | Admitting: Psychology

## 2021-09-14 DIAGNOSIS — F418 Other specified anxiety disorders: Secondary | ICD-10-CM | POA: Diagnosis not present

## 2021-09-14 NOTE — Progress Notes (Signed)
[facility] Counselor/Therapist Progress Note  Patient ID: Leah [name], MRN: [id number]    Date: 09/14/21  Time Spent: 10:02 am -  10:56 am : 54 Minutes  Treatment Type: Individual Therapy.  Reported Symptoms: Anxiety, grief and loss.   Mental Status Exam: Appearance:  Neat and Well Groomed     Behavior: Appropriate  Motor: Normal  Speech/Language:  Clear and Coherent  Affect: Appropriate  Mood: anxious  Thought process: normal  Thought content:   WNL  Sensory/Perceptual disturbances:   WNL  Orientation: oriented to person, place, time/date, and situation  Attention: Good  Concentration: Good  Memory: [place] of knowledge:  Good  Insight:   Good  Judgment:  Good  Impulse Control: Good   Risk Assessment: Danger to Self:  No Self-injurious Behavior: No Danger to Others: No Duty to Warn:no Physical Aggression / Violence:No  Access to Firearms a concern: No  Gang Involvement:No 8  Subjective:   Leah [name] participated from home, via video, and consented to treatment. Therapist participated from home office. We met online due to [place] pandemic. Leah [name] reviewed the events of the past week. Leah [name] noted an upcoming biopsy and noted worry regarding the results due to her history of cancer. Therapist normalized Leah [name]'s worry due to her history of cancer. We reviewed her coping, during the session and discussed challenging assumptions and negative self-talk. She noted some work stressors due to a decrease in team hours and personality issues. We processed these stressors, during the session, and worked on identifying boundaries going forward. She noted continued struggles to get to the gym due to work volume. We engaged in problem-solving to address concerns, employing BA principles to prepare, set expectations, & schedule. She noted some improvement in her sleep and noted her inhaler aiding in her quality of sleep. She noted some engagement in socializing  with her friend, Leah [name], which therapist praised. She is spending more time outside and relies on prayer for coping. Leah [name] was engaged and motivated.  Therapist validated and normalized Leah [name]'s feelings and provided supportive therapy. A follow-up was scheduled.   Interventions: Cognitive Behavioral Therapy and BA  Diagnosis:  Other specified anxiety disorders  Treatment Plan:  Client Abilities/Strengths Leah [name] forthcoming and motivated for change.   Support System: Family and friends.  Client Treatment Preferences OPT  Client Statement of Needs Leah [name] would like to manage her symptoms, bolster coping skills, and process past events.   Treatment Level Weekly  Symptoms  Anxiety: Difficulty sleeping (5hrs) with middle insomnia, Anxiety (finances & health), feeling on edge, difficulty managing anxiety, worry about different things, mild irritability, feeling afraid something bad might happen.   (Status: maintained)  Depression: Poor eating, decreased sleep, feeling bad about self, no panic symptoms, difficulty focusing, muscle tension, psychomotor retardation.   (Status: maintained)  Goals:   Leah [name] experiences symptoms of anxiety and infrequent depressive symptoms.    Target Date: 10/10/21 Frequency: Weekly  Progress: 0 Modality: individual    Therapist will provide referrals for additional resources as appropriate.  Therapist will provide psycho-education regarding Leah [name]'s diagnosis and corresponding treatment approaches and interventions. Licensed Clinical Social Worker, [place], LCSW will support the patient's ability to achieve the goals identified. will employ CBT, BA, Problem-solving, Solution Focused, Mindfulness,  coping skills, & other evidenced-based practices will be used to promote progress towards healthy functioning to help manage decrease symptoms associated with her diagnosis.   Reduce overall level, frequency, and intensity of the feelings of depression, anxiety  and  panic evidenced by decreased overall symptoms from 6 to 7 days/week to 0 to 1 days/week per client report for at least 3 consecutive months. Verbally express understanding of the relationship between feelings of depression and  anxiety and their impact on thinking patterns and behaviors. Verbalize an understanding of the role that distorted thinking plays in creating fears, excessive worry, and ruminations.  Leah [name] participated in the creation of the treatment plan)  [name], LCSW

## 2021-09-15 DIAGNOSIS — N72 Inflammatory disease of cervix uteri: Secondary | ICD-10-CM | POA: Diagnosis not present

## 2021-09-15 DIAGNOSIS — B977 Papillomavirus as the cause of diseases classified elsewhere: Secondary | ICD-10-CM | POA: Diagnosis not present

## 2021-09-15 DIAGNOSIS — R87610 Atypical squamous cells of undetermined significance on cytologic smear of cervix (ASC-US): Secondary | ICD-10-CM | POA: Diagnosis not present

## 2021-09-15 DIAGNOSIS — R87810 Cervical high risk human papillomavirus (HPV) DNA test positive: Secondary | ICD-10-CM | POA: Diagnosis not present

## 2021-10-26 ENCOUNTER — Ambulatory Visit (INDEPENDENT_AMBULATORY_CARE_PROVIDER_SITE_OTHER): Payer: Medicaid Other | Admitting: Psychology

## 2021-10-26 DIAGNOSIS — F418 Other specified anxiety disorders: Secondary | ICD-10-CM

## 2021-10-26 NOTE — Progress Notes (Signed)
[facility] Counselor/Therapist Progress Note  Patient ID: Leah [name], MRN: [id number]    Date: 10/26/21  Time Spent: 8:02 am -  8:46 am : 44 Minutes  Treatment Type: Individual Therapy.  Reported Symptoms: Anxiety, grief and loss.   Mental Status Exam: Appearance:  Neat and Well Groomed     Behavior: Appropriate  Motor: Normal  Speech/Language:  Clear and Coherent  Affect: Appropriate  Mood: normal  Thought process: normal  Thought content:   WNL  Sensory/Perceptual disturbances:   WNL  Orientation: oriented to person, place, time/date, and situation  Attention: Good  Concentration: Good  Memory: Leah [name] of knowledge:  Good  Insight:   Good  Judgment:  Good  Impulse Control: Good   Risk Assessment: Danger to Self:  No Self-injurious Behavior: No Danger to Others: No Duty to Warn:no Physical Aggression / Violence:No  Access to Firearms a concern: No  Gang Involvement:No 8  Subjective:   Leah [name] Reason participated from home, via video, and consented to treatment. Therapist participated from office. We met online due to [place] pandemic. Leah [name] reviewed the events of the past week. She noted feeling ill and needing to go to her provider for continued follow-up with a cough and wheezing. She noted a follow-up regarding this issue in one week. She noted having difficulty with her stamina. She noted her upcoming transition, a move, and hope that her son will be more proactive in the move. She noted a lack of support from her sister during Leah [name] cancer treatment. She continues to struggle with sleep due to wheezing and coughing. We reviewed sleep hygiene. She noted this affecting her ability to exercise, as well.  She noted her fear that her cancer could reappear and noted having an acquaintance being cancer free for 2 years prior to being diagnosed with metastatic breast cancer. We explored this during the session. Leah [name] was engaged and motivated.   Therapist validated and normalized Leah [name]'s feelings and provided supportive therapy. A follow-up was scheduled.   Interventions: Cognitive Behavioral Therapy  Diagnosis:  Other specified anxiety disorders  Treatment Plan:  Client Abilities/Strengths Ali forthcoming and motivated for change.   Support System: Family and friends.  Client Treatment Preferences OPT  Client Statement of Needs Leah [name] would like to manage her symptoms, bolster coping skills, and process past events.   Treatment Level Weekly  Symptoms  Anxiety: Difficulty sleeping (5hrs) with middle insomnia, Anxiety (finances & health), feeling on edge, difficulty managing anxiety, worry about different things, mild irritability, feeling afraid something bad might happen.   (Status: maintained)  Depression: Poor eating, decreased sleep, feeling bad about self, no panic symptoms, difficulty focusing, muscle tension, psychomotor retardation.   (Status: maintained)  Goals:   [place] experiences symptoms of anxiety and infrequent depressive symptoms.    Target Date: 11/10/21 Frequency: Weekly  Progress: 0 Modality: individual    Therapist will provide referrals for additional resources as appropriate.  Therapist will provide psycho-education regarding [place]'s diagnosis and corresponding treatment approaches and interventions. Licensed Clinical Social Worker, [place], LCSW will support the patient's ability to achieve the goals identified. will employ CBT, BA, Problem-solving, Solution Focused, Mindfulness,  coping skills, & other evidenced-based practices will be used to promote progress towards healthy functioning to help manage decrease symptoms associated with her diagnosis.   Reduce overall level, frequency, and intensity of the feelings of depression, anxiety and panic evidenced by decreased overall symptoms from 6 to 7 days/week to 0 to 1 days/week per  client report for at least 3 consecutive months. Verbally  express understanding of the relationship between feelings of depression and  anxiety and their impact on thinking patterns and behaviors. Verbalize an understanding of the role that distorted thinking plays in creating fears, excessive worry, and ruminations.  Leah [name] participated in the creation of the treatment plan)  Leah Irish, LCSW

## 2021-10-27 DIAGNOSIS — R0600 Dyspnea, unspecified: Secondary | ICD-10-CM | POA: Diagnosis not present

## 2021-10-27 DIAGNOSIS — R059 Cough, unspecified: Secondary | ICD-10-CM | POA: Diagnosis not present

## 2021-10-27 DIAGNOSIS — R051 Acute cough: Secondary | ICD-10-CM | POA: Diagnosis not present

## 2021-10-27 DIAGNOSIS — J40 Bronchitis, not specified as acute or chronic: Secondary | ICD-10-CM | POA: Diagnosis not present

## 2021-11-09 ENCOUNTER — Other Ambulatory Visit: Payer: Medicaid Other

## 2021-11-09 ENCOUNTER — Ambulatory Visit (INDEPENDENT_AMBULATORY_CARE_PROVIDER_SITE_OTHER): Payer: Medicaid Other | Admitting: Internal Medicine

## 2021-11-09 ENCOUNTER — Ambulatory Visit
Admission: RE | Admit: 2021-11-09 | Discharge: 2021-11-09 | Disposition: A | Discharge status: Home / Self Care | Payer: Medicaid Other | Source: Ambulatory Visit | Attending: Oncology | Admitting: Oncology

## 2021-11-09 ENCOUNTER — Encounter: Payer: Self-pay | Admitting: Internal Medicine

## 2021-11-09 VITALS — BP 134/60 | HR 91 | Temp 98.2°F | Resp 16 | Ht 64.5 in | Wt 176.6 lb

## 2021-11-09 DIAGNOSIS — Z5181 Encounter for therapeutic drug level monitoring: Secondary | ICD-10-CM | POA: Diagnosis not present

## 2021-11-09 DIAGNOSIS — M85852 Other specified disorders of bone density and structure, left thigh: Secondary | ICD-10-CM | POA: Diagnosis not present

## 2021-11-09 DIAGNOSIS — S50862A Insect bite (nonvenomous) of left forearm, initial encounter: Secondary | ICD-10-CM

## 2021-11-09 DIAGNOSIS — L03114 Cellulitis of left upper limb: Secondary | ICD-10-CM | POA: Diagnosis not present

## 2021-11-09 DIAGNOSIS — Z79811 Long term (current) use of aromatase inhibitors: Secondary | ICD-10-CM | POA: Diagnosis not present

## 2021-11-09 DIAGNOSIS — J40 Bronchitis, not specified as acute or chronic: Secondary | ICD-10-CM | POA: Diagnosis not present

## 2021-11-09 DIAGNOSIS — W57XXXA Bitten or stung by nonvenomous insect and other nonvenomous arthropods, initial encounter: Secondary | ICD-10-CM

## 2021-11-09 MED ORDER — TRIAMCINOLONE ACETONIDE 0.5 % EX CREA: TOPICAL_CREAM | Freq: Two times a day (BID) | CUTANEOUS | 0 refills | Status: DC

## 2021-11-09 MED ORDER — DOXYCYCLINE HYCLATE 100 MG PO TABS: 100.0000 mg | ORAL_TABLET | Freq: Two times a day (BID) | ORAL | 0 refills | Status: DC

## 2021-11-09 NOTE — Progress Notes (Signed)
Chief Complaint  Patient presents with   Shortness of Breath    Pt has already been seen at [facility] clinic walk in for SOB they dx and px some meds pt kept this appt because she still get stuffy and short of breath when she gets stuffy and hot.   F/u  Sob seen 10/27/21 [facility] due sob, wheezing hypoxia o2 went 85-86% with exertion rest O2 93% but feeling better wheezing and sob given zpack, prednisone, tessalon perles, prn albuterol inhaler, promethazine dm but did not take tessalon perles and feeling better   Bug Bites to lower legs new and left arm something stung her 2 days ago with redness and swelling and pain   Review of Systems  Constitutional:  Negative for weight loss.  HENT:  Negative for hearing loss.   Eyes:  Negative for blurred vision.  Respiratory:  Negative for shortness of breath.   Cardiovascular:  Negative for chest pain.  Gastrointestinal:  Negative for abdominal pain and blood in stool.  Genitourinary:  Negative for dysuria.  Musculoskeletal:  Negative for falls and joint pain.  Skin:  Negative for rash.  Neurological:  Negative for headaches.  Psychiatric/Behavioral:  Negative for depression.    Past Medical History:  Diagnosis Date   Anxiety    Asthma    as a child   Breast cancer ([place])    Carpal tunnel syndrome    R hand    Colon polyps    Depression    Eosinophilic esophagitis    noted in [place] with GI path report 09/29/14    Esophageal stricture    s/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 Edwin Cap    Family history of breast cancer    Family history of prostate cancer    Herniated disc, cervical    s/p epidural injection   Herpes    History of kidney stones    Hyperlipidemia    Low back pain    Migraines    Personal history of chemotherapy    Personal history of radiation therapy    Past Surgical History:  Procedure Laterality Date   BREAST BIOPSY Left 12/25/2018   [place] BX, invasive mammary carcinoma    BREAST LUMPECTOMY      BREAST LUMPECTOMY WITH NEEDLE LOCALIZATION AND AXILLARY SENTINEL LYMPH NODE BX Left 01/16/2019   Procedure: LEFT BREAST LUMPECTOMY WITH NEEDLE LOCALIZATION AND SENTINEL LYMPH NODE BIOPSY;  Surgeon: Jovita Kussmaul, MD;  Location: [facility] ORS;  Service: General;  Laterality: Left;   CESAREAN SECTION     COLONOSCOPY WITH PROPOFOL N/A 03/27/2017   Procedure: COLONOSCOPY WITH PROPOFOL;  Surgeon: Jonathon Bellows, MD;  Location: [facility] ENDOSCOPY;  Service: Gastroenterology;  Laterality: N/A;   ESOPHAGOGASTRODUODENOSCOPY     ESOPHAGOGASTRODUODENOSCOPY (EGD) WITH PROPOFOL N/A 03/27/2017   Procedure: ESOPHAGOGASTRODUODENOSCOPY (EGD) WITH PROPOFOL WITH DILATION;  Surgeon: Jonathon Bellows, MD;  Location: [facility] ENDOSCOPY;  Service: Gastroenterology;  Laterality: N/A;   PORTACATH PLACEMENT N/A 02/15/2019   Procedure: INSERTION PORT-A-CATH WITH POSSIBLE ULTRASOUND;  Surgeon: Jovita Kussmaul, MD;  Location: [facility] ORS;  Service: General;  Laterality: N/A;   RE-EXCISION OF BREAST CANCER,SUPERIOR MARGINS Left 02/15/2019   Procedure: RE-EXCISION OF LEFT BREAST CANCER ANTERIOR MARGINS;  Surgeon: Jovita Kussmaul, MD;  Location: [facility] ORS;  Service: General;  Laterality: Left;   THROAT SURGERY  4709   UMBILICAL HERNIA REPAIR  2006    x 2   w mesh per pt   Family History  Problem Relation Age  of Onset   Breast cancer Mother        dx late 22s   Thyroid disease Mother    Colon polyps Mother    Prostate cancer Father        dx 52   Breast cancer Maternal Grandmother        dx late 13s   Osteoporosis Maternal Grandmother    Cancer Maternal Uncle        possibly, unsure type   Social History   Socioeconomic History   Marital status: Single    Spouse name: Not on file   Number of children: Not on file   Years of education: Not on file   Highest education level: Not on file  Occupational History   Not on file  Tobacco Use   Smoking status: Former    Packs/day: 1.00    Years: 20.00    Total pack years: 20.00    Types:  Cigarettes    Quit date: 06/02/2006    Years since quitting: 15.4   Smokeless tobacco: Never  Vaping Use   Vaping Use: Never used  Substance and Sexual Activity   Alcohol use: Not Currently   Drug use: No   Sexual activity: Yes    Partners: Male  Other Topics Concern   Not on file  Social History Narrative   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 As of 02/26/17    Divorced.    Former smoker 20+ years quit in 1997 then started again for 5 years and quit 10-12 years ago as of 03/08/17    Social Determinants of Radio broadcast assistant Strain: Not on file  Food Insecurity: Not on file  Transportation Needs: Not on file  Physical Activity: Not on file  Stress: Not on file  Social Connections: Not on file  Intimate Partner Violence: Not on file   Current Meds  Medication Sig   acetaminophen (TYLENOL) 500 MG tablet Take 500-1,000 mg by mouth every 6 (six) hours as needed (for pain.).   albuterol (VENTOLIN HFA) 108 (90 Base) MCG/ACT inhaler Inhale 2 puffs into the lungs every 6 (six) hours as needed for wheezing or shortness of breath.   anastrozole (ARIMIDEX) 1 MG tablet TAKE 1 TABLET BY MOUTH EVERY DAY   atorvastatin (LIPITOR) 40 MG tablet Take 1 tablet (40 mg total) by mouth daily.   cetirizine (ZYRTEC) 10 MG tablet Take 1 tablet (10 mg total) by mouth daily as needed for allergies.   Cholecalciferol (VITAMIN D-3) 125 MCG (5000 UT) TABS Take 5,000 Units by mouth daily.   Cyanocobalamin (VITAMIN B-12 PO) Take 1 tablet by mouth daily. 1046m daily   doxycycline (VIBRA-TABS) 100 MG tablet Take 1 tablet (100 mg total) by mouth 2 (two) times daily. With food x 1 week   DULoxetine (CYMBALTA) 60 MG capsule TAKE 1 CAPSULE BY MOUTH EVERY DAY   fluticasone (FLONASE) 50 MCG/ACT nasal spray Place 2 sprays into both nostrils daily. Max 2 sprays   gabapentin (NEURONTIN) 300 MG capsule TAKE 1 CAPSULE  BY MOUTH THREE TIMES A DAY   meclizine (ANTIVERT) 12.5 MG tablet TAKE 1 TABLET BY MOUTH 3 TIMES DAILY AS NEEDED FOR DIZZINESS.   meloxicam (MOBIC) 15 MG tablet Take 1 tablet (15 mg total) by mouth daily as needed for pain.   methocarbamol (ROBAXIN) 750 MG tablet Take 1 tablet (  750 mg total) by mouth every 6 (six) hours as needed for muscle spasms.   omeprazole (PRILOSEC) 40 MG capsule Take 1 capsule (40 mg total) by mouth every evening. 30 min before food   sodium chloride (OCEAN) 0.65 % SOLN nasal spray Place 2 sprays into both nostrils daily as needed for congestion.   tiZANidine (ZANAFLEX) 4 MG tablet Take 4 mg by mouth at bedtime.   traMADol (ULTRAM) 50 MG tablet Take 1-2 tablets (50-100 mg total) by mouth every 6 (six) hours as needed.   traMADol (ULTRAM) 50 MG tablet Take by mouth.   traZODone (DESYREL) 50 MG tablet Take 1-1.5 tablets (50-75 mg total) by mouth at bedtime as needed for sleep.   triamcinolone cream (KENALOG) 0.5 % Apply topically 2 (two) times daily.   valACYclovir (VALTREX) 1000 MG tablet Take 1 tablet (1,000 mg total) by mouth daily. Suppression. BID X 5 days with outbreak as needed   Allergies  Allergen Reactions   Hydrocodone-Acetaminophen Hives    All over her body.   Lactose Intolerance (Gi) Other (See Comments)    Bloating and GI upset   Penicillins Rash    Did it involve swelling of the face/tongue/throat, SOB, or low BP? Unknown Did it involve sudden or severe rash/hives, skin peeling, or any reaction on the inside of your mouth or nose? Yes Did you need to seek medical attention at a hospital or doctor's office? Unknown When did it last happen? Childhood reaction.      If all above answers are "NO", may proceed with cephalosporin use.    Sulfa Antibiotics Rash    Full body rash    Recent Results (from the past 2160 hour(s))  Cytology - PAP( [facility])     Status: Abnormal   Collection Time: 08/20/21  8:50 AM  Result Value Ref Range   High risk HPV  Positive (A)    Adequacy      Satisfactory for evaluation; transformation zone component PRESENT.   Diagnosis (A)     - Atypical squamous cells of undetermined significance (ASC-US)   Comment Normal Reference Range HPV - Negative   Hemoglobin A1c     Status: None   Collection Time: 08/20/21  9:10 AM  Result Value Ref Range   Hgb A1c MFr Bld 5.7 4.6 - 6.5 %    Comment: Glycemic Control Guidelines for People with Diabetes:Non Diabetic:  <6%Goal of Therapy: <7%Additional Action Suggested:  >8%   Lipid panel     Status: Abnormal   Collection Time: 08/20/21  9:10 AM  Result Value Ref Range   Cholesterol 261 (H) 0 - 200 mg/dL    Comment: ATP III Classification       Desirable:  < 200 mg/dL               Borderline High:  200 - 239 mg/dL          High:  > = 240 mg/dL   Triglycerides 192.0 (H) 0.0 - 149.0 mg/dL    Comment: Normal:  <150 mg/dLBorderline High:  150 - 199 mg/dL   HDL 63.60 >39.00 mg/dL   VLDL 38.4 0.0 - 40.0 mg/dL   LDL Cholesterol 159 (H) 0 - 99 mg/dL   Total CHOL/HDL Ratio 4     Comment:                Men          Women1/2 Average Risk     3.4  3.3Average Risk          5.0          4.42X Average Risk          9.6          7.13X Average Risk          15.0          11.0                       NonHDL 197.25     Comment: NOTE:  Non-HDL goal should be 30 mg/dL higher than patient's LDL goal (i.e. LDL goal of < 70 mg/dL, would have non-HDL goal of < 100 mg/dL)   Objective  There is no height or weight on file to calculate BMI. Wt Readings from Last 3 Encounters:  08/20/21 176 lb 9.6 oz (80.1 kg)  05/26/21 175 lb (79.4 kg)  05/18/21 174 lb 6.4 oz (79.1 kg)   Temp Readings from Last 3 Encounters:  08/20/21 98.3 F (36.8 C) (Oral)  05/26/21 (!) 96.8 F (36 C) (Tympanic)  05/18/21 98.1 F (36.7 C) (Oral)   BP Readings from Last 3 Encounters:  08/20/21 120/70  05/26/21 139/90  05/18/21 130/84   Pulse Readings from Last 3 Encounters:  08/20/21 89  05/26/21 89   05/18/21 92    Physical Exam Vitals and nursing note reviewed.  Constitutional:      Appearance: Normal appearance. She is well-developed and well-groomed.  HENT:     Head: Normocephalic and atraumatic.  Eyes:     Conjunctiva/sclera: Conjunctivae normal.     Pupils: Pupils are equal, round, and reactive to light.  Cardiovascular:     Rate and Rhythm: Normal rate and regular rhythm.     Heart sounds: Normal heart sounds. No murmur heard. Pulmonary:     Effort: Pulmonary effort is normal.     Breath sounds: Normal breath sounds.  Abdominal:     General: Abdomen is flat. Bowel sounds are normal.     Tenderness: There is no abdominal tenderness.  Musculoskeletal:        General: No tenderness.  Skin:    General: Skin is warm and dry.  Neurological:     General: No focal deficit present.     Mental Status: She is alert and oriented to person, place, and time. Mental status is at baseline.     Cranial Nerves: Cranial nerves 2-12 are intact.     Motor: Motor function is intact.     Coordination: Coordination is intact.     Gait: Gait is intact.  Psychiatric:        Attention and Perception: Attention and perception normal.        Mood and Affect: Mood and affect normal.        Speech: Speech normal.        Behavior: Behavior normal. Behavior is cooperative.        Thought Content: Thought content normal.        Cognition and Memory: Cognition and memory normal.        Judgment: Judgment normal.     Assessment  Plan  Bronchitis Resolved  Left arm cellulitis s/p bug bite - Plan: doxycycline (VIBRA-TABS) 100 MG tablet bid x 14 days   Insect bite of left forearm, initial encounter - Plan: doxycycline (VIBRA-TABS) 100 MG tablet  Bug bite, initial encounter b/l legs - Plan: triamcinolone cream (KENALOG) 0.5 %   HM Had flu  shot utd 01/17/21  Tdap 03/13/18 rec hep B vaccine will call back to sch  rec MMR vaccine   covid 19 vaccine not had had covid 19 + declines Shingrix  2/2 utd   EGD/colonoscopy had 03/27/17 Dr. Bailey Mech diverticulosis, serrated polyps neg EGD for eosinophillic esophagitis repeat colonoscopy due in  5years due 03/2022 -refilled PPI  FH polyps in Mom    rec D3 5000 IU daily    mammo 10/10/17 negative FH breast cancer in mom and m gm in 19s. consider genetic testing in future  \mammogram had 10/12/18 needs left mammo and [place] had 12/18/18 abnormal + breast cancer/left   11/15/19 mammogram negative  H/o breast cancer  Neg 11/2020 ordered 11/2021 diagnostic    DEXA osteopenia T -1.2 calcium 600 mg 1-2 x per day and vitamin D3 5000 11/09/21 Dexa 11/09/21 osteopenia repeat in 2 years calcium and vitamin D    Pap 04/04/17 h/o abnormal neg pap neg HPV 08/20/21 [facility] referred [facility] 01/28/22  Dr Glennon Mac  Former smoker congratulated on quitting    NS saw 01/23/18 cervical spinal cord kyphosis, cervical myelopathy f/u in 4 months referral to pm&R for pain tx Dr. Remo Lipps cook     MRI 10/10/18 abnormal neurology does not think MS rec [facility]    [facility] seen 01/04/19 stage 1 left breast cancer ER+ Dr. Marlou Starks breast conservation surgery and sentinel node bx f/u with h/o after surgery    CC Dr. Manuella Ghazi to see when due to f/u neurology    rec healthy diet and exercise  Provider: Dr. Olivia Mackie McLean-Scocuzza-[facility]

## 2021-11-09 NOTE — Patient Instructions (Addendum)
Dr. Volanda Napoleon new PCP  Calcium 600 mg 1-2 x per day or 1 tums daily  Vitamin D continue 5000 IU daily Walking     Cellulitis, Adult  Cellulitis is a skin infection. The infected area is usually warm, red, swollen, and tender. This condition occurs most often in the arms and lower legs. The infection can travel to the muscles, blood, and underlying tissue and become serious. It is very important to get treated for this condition. What are the causes? Cellulitis is caused by bacteria. The bacteria enter through a break in the skin, such as a cut, burn, insect bite, open sore, or crack. What increases the risk? This condition is more likely to occur in people who: Have a weak body defense system (immune system). Have open wounds on the skin, such as cuts, burns, bites, and scrapes. Bacteria can enter the body through these open wounds. Are older than 54 years of age. Have diabetes. Have a type of long-lasting (chronic) liver disease (cirrhosis) or kidney disease. Are obese. Have a skin condition such as: Itchy rash (eczema). Slow movement of blood in the veins (venous stasis). Fluid buildup below the skin (edema). Have had radiation therapy. Use IV drugs. What are the signs or symptoms? Symptoms of this condition include: Redness, streaking, or spotting on the skin. Swollen area of the skin. Tenderness or pain when an area of the skin is touched. Warm skin. A fever. Chills. Blisters. How is this diagnosed? This condition is diagnosed based on a medical history and physical exam. You may also have tests, including: Blood tests. Imaging tests. How is this treated? Treatment for this condition may include: Medicines, such as antibiotic medicines or medicines to treat allergies (antihistamines). Supportive care, such as rest and application of cold or warm cloths (compresses) to the skin. Hospital care, if the condition is severe. The infection usually starts to get better within  1-2 days of treatment. Follow these instructions at home:  Medicines Take over-the-counter and prescription medicines only as told by your health care provider. If you were prescribed an antibiotic medicine, take it as told by your health care provider. Do not stop taking the antibiotic even if you start to feel better. General instructions Drink enough fluid to keep your urine pale yellow. Do not touch or rub the infected area. Raise (elevate) the infected area above the level of your heart while you are sitting or lying down. Apply warm or cold compresses to the affected area as told by your health care provider. Keep all follow-up visits as told by your health care provider. This is important. These visits let your health care provider make sure a more serious infection is not developing. Contact a health care provider if: You have a fever. Your symptoms do not begin to improve within 1-2 days of starting treatment. Your bone or joint underneath the infected area becomes painful after the skin has healed. Your infection returns in the same area or another area. You notice a swollen bump in the infected area. You develop new symptoms. You have a general ill feeling (malaise) with muscle aches and pains. Get help right away if: Your symptoms get worse. You feel very sleepy. You develop vomiting or diarrhea that persists. You notice red streaks coming from the infected area. Your red area gets larger or turns dark in color. These symptoms may represent a serious problem that is an emergency. Do not wait to see if the symptoms will go away. Get medical help  right away. Call your local emergency services (911 in the [place] Do not drive yourself to the hospital. Summary Cellulitis is a skin infection. This condition occurs most often in the arms and lower legs. Treatment for this condition may include medicines, such as antibiotic medicines or antihistamines. Take over-the-counter and  prescription medicines only as told by your health care provider. If you were prescribed an antibiotic medicine, do not stop taking the antibiotic even if you start to feel better. Contact a health care provider if your symptoms do not begin to improve within 1-2 days of starting treatment or your symptoms get worse. Keep all follow-up visits as told by your health care provider. This is important. These visits let your health care provider make sure that a more serious infection is not developing. This information is not intended to replace advice given to you by your health care provider. Make sure you discuss any questions you have with your health care provider. Document Revised: 12/09/2020 Document Reviewed: 12/10/2020 Elsevier Patient Education  [place].

## 2021-11-10 ENCOUNTER — Encounter: Payer: Self-pay | Admitting: Internal Medicine

## 2021-11-10 ENCOUNTER — Encounter: Payer: Self-pay | Admitting: Oncology

## 2021-11-17 ENCOUNTER — Other Ambulatory Visit: Payer: Self-pay | Admitting: Oncology

## 2021-11-17 DIAGNOSIS — J209 Acute bronchitis, unspecified: Secondary | ICD-10-CM | POA: Diagnosis not present

## 2021-11-17 DIAGNOSIS — B9689 Other specified bacterial agents as the cause of diseases classified elsewhere: Secondary | ICD-10-CM | POA: Diagnosis not present

## 2021-11-17 DIAGNOSIS — J019 Acute sinusitis, unspecified: Secondary | ICD-10-CM | POA: Diagnosis not present

## 2021-11-23 ENCOUNTER — Ambulatory Visit (INDEPENDENT_AMBULATORY_CARE_PROVIDER_SITE_OTHER): Payer: Medicaid Other | Admitting: Psychology

## 2021-11-23 DIAGNOSIS — F418 Other specified anxiety disorders: Secondary | ICD-10-CM | POA: Diagnosis not present

## 2021-11-23 NOTE — Progress Notes (Unsigned)
Comprehensive Clinical Assessment (CCA) Note  11/23/2021 Leah [name] [id number]  Time Spent: 8:00  am - 8:53 am: 54 Minutes  Chief Complaint: No chief complaint on file.  Visit Diagnosis: Anxiety   Guardian/Payee:  Self.    Paperwork requested: No   [name] for Visit /Presenting Problem: Anxiety.   Mental Status Exam: Appearance:   Fairly Groomed and Neat     Behavior:  Appropriate  Motor:  Normal  Speech/Language:   Clear and Coherent  Affect:  Congruent  Mood:  normal  Thought process:  normal  Thought content:    WNL  Sensory/Perceptual disturbances:    WNL  Orientation:  oriented to person, place, time/date, and situation  Attention:  Good  Concentration:  Good  Memory:  WNL  Fund of knowledge:   Good  Insight:    Good  Judgment:   Good  Impulse Control:  Good    Reported Symptoms:  depression and anxiety.   Risk Assessment: Danger to Self:  No Self-injurious Behavior: No Danger to Others: No Duty to Warn:no Physical Aggression / Violence:No  Access to Firearms a concern: No  Gang Involvement:No  Patient / guardian was educated about steps to take if suicide or homicide risk level increases between visits: no While future psychiatric events cannot be accurately predicted, the patient does not currently require acute inpatient psychiatric care and does not currently meet [facility] involuntary commitment criteria.  Substance Abuse History: Current substance abuse: No     Caffeine: 32 oz of coffee. Alcohol: 1-2x per month.  Tobacco: Denied. Substance use: denied.   Past Psychiatric History:   Previous psychological history is significant for anxiety and depression Outpatient Providers: [name], [facility].  History of Psych Hospitalization: No  Psychological Testing:  NA    Abuse History:  Victim of: No.,  na    Report needed: No. Victim of Neglect:No. Perpetrator of  NA   Witness / Exposure to Domestic  Violence: No   Protective Services Involvement: No  Witness to [employer] Violence:  No   Family History:  Family History  Problem Relation Age of Onset   Breast cancer Mother        dx late 85s   Thyroid disease Mother    Colon polyps Mother    Prostate cancer Father        dx 74   Breast cancer Maternal Grandmother        dx late 72s   Osteoporosis Maternal Grandmother    Cancer Maternal Uncle        possibly, unsure type    Living situation: the patient lives with their son  Sexual Orientation: Straight  Relationship Status: single  Name of spouse / other: NA If a parent, number of children / ages:Son 54 & daughter 78.   Support Systems: Ivar Drape (friend), Rob (Friend), son.   Financial Stress:  Yes   Income/Employment/Disability: Employment  Armed forces logistics/support/administrative officer: No   Educational History: Education: high school diploma/GED  Religion/Sprituality/World View: Christian  Any cultural differences that may affect / interfere with treatment:  not applicable   Recreation/Hobbies: [place], visit family, reading, drawing.   Stressors: Financial difficulties   Health problems    Strengths: Supportive Relationships, Church, Spirituality, Hopefulness, Conservator, museum/gallery, and Able to Communicate Effectively  Barriers:  Finances & health.    Legal History: Pending legal issue / charges: The patient has no significant history of legal issues. History of legal issue / charges:  na  Medical History/Surgical History: reviewed Past Medical History:  Diagnosis Date   Anxiety    Asthma    as a child   Breast cancer ([place])    Carpal tunnel syndrome    R hand    Colon polyps    Depression    Eosinophilic esophagitis    noted in [place] with GI path report 09/29/14    Esophageal stricture    s/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 Edwin Cap    Family history of breast cancer    Family history of prostate cancer    Herniated disc, cervical    s/p epidural  injection   Herpes    History of kidney stones    Hyperlipidemia    Low back pain    Migraines    Personal history of chemotherapy    Personal history of radiation therapy     Past Surgical History:  Procedure Laterality Date   BREAST BIOPSY Left 12/25/2018   [place] BX, invasive mammary carcinoma    BREAST LUMPECTOMY     BREAST LUMPECTOMY WITH NEEDLE LOCALIZATION AND AXILLARY SENTINEL LYMPH NODE BX Left 01/16/2019   Procedure: LEFT BREAST LUMPECTOMY WITH NEEDLE LOCALIZATION AND SENTINEL LYMPH NODE BIOPSY;  Surgeon: Jovita Kussmaul, MD;  Location: [facility] ORS;  Service: General;  Laterality: Left;   CESAREAN SECTION     COLONOSCOPY WITH PROPOFOL N/A 03/27/2017   Procedure: COLONOSCOPY WITH PROPOFOL;  Surgeon: Jonathon Bellows, MD;  Location: [facility] ENDOSCOPY;  Service: Gastroenterology;  Laterality: N/A;   ESOPHAGOGASTRODUODENOSCOPY     ESOPHAGOGASTRODUODENOSCOPY (EGD) WITH PROPOFOL N/A 03/27/2017   Procedure: ESOPHAGOGASTRODUODENOSCOPY (EGD) WITH PROPOFOL WITH DILATION;  Surgeon: Jonathon Bellows, MD;  Location: [facility] ENDOSCOPY;  Service: Gastroenterology;  Laterality: N/A;   PORTACATH PLACEMENT N/A 02/15/2019   Procedure: INSERTION PORT-A-CATH WITH POSSIBLE ULTRASOUND;  Surgeon: Jovita Kussmaul, MD;  Location: [facility] ORS;  Service: General;  Laterality: N/A;   RE-EXCISION OF BREAST CANCER,SUPERIOR MARGINS Left 02/15/2019   Procedure: RE-EXCISION OF LEFT BREAST CANCER ANTERIOR MARGINS;  Surgeon: Jovita Kussmaul, MD;  Location: [facility] ORS;  Service: General;  Laterality: Left;   THROAT SURGERY  1245   UMBILICAL HERNIA REPAIR  2006    x 2   w mesh per pt    Medications: Current Outpatient Medications  Medication Sig Dispense Refill   acetaminophen (TYLENOL) 500 MG tablet Take 500-1,000 mg by mouth every 6 (six) hours as needed (for pain.).     albuterol (VENTOLIN HFA) 108 (90 Base) MCG/ACT inhaler Inhale 2 puffs into the lungs every 6 (six) hours as needed for wheezing or shortness of breath. 18 g 11    anastrozole (ARIMIDEX) 1 MG tablet TAKE 1 TABLET BY MOUTH EVERY DAY 90 tablet 2   atorvastatin (LIPITOR) 40 MG tablet Take 1 tablet (40 mg total) by mouth daily. 90 tablet 3   benzonatate (TESSALON) 200 MG capsule Take 200 mg by mouth 3 (three) times daily as needed.     cetirizine (ZYRTEC) 10 MG tablet Take 1 tablet (10 mg total) by mouth daily as needed for allergies. 90 tablet 3   Cholecalciferol (VITAMIN D-3) 125 MCG (5000 UT) TABS Take 5,000 Units by mouth daily.     Cyanocobalamin (VITAMIN B-12 PO) Take 1 tablet by mouth daily. 1050m daily     doxycycline (VIBRA-TABS) 100 MG tablet Take 1 tablet (100 mg total) by mouth 2 (two) times daily. With food x 1 week 14 tablet 0   DULoxetine (CYMBALTA) 60 MG capsule  TAKE 1 CAPSULE BY MOUTH EVERY DAY 90 capsule 1   fluticasone (FLONASE) 50 MCG/ACT nasal spray Place 2 sprays into both nostrils daily. Max 2 sprays 16 g 12   gabapentin (NEURONTIN) 300 MG capsule TAKE 1 CAPSULE BY MOUTH THREE TIMES A DAY 90 capsule 2   meclizine (ANTIVERT) 12.5 MG tablet TAKE 1 TABLET BY MOUTH 3 TIMES DAILY AS NEEDED FOR DIZZINESS. 90 tablet 3   meloxicam (MOBIC) 15 MG tablet Take 1 tablet (15 mg total) by mouth daily as needed for pain. 90 tablet 3   methocarbamol (ROBAXIN) 750 MG tablet Take 1 tablet (750 mg total) by mouth every 6 (six) hours as needed for muscle spasms. 120 tablet 5   omeprazole (PRILOSEC) 40 MG capsule Take 1 capsule (40 mg total) by mouth every evening. 30 min before food 90 capsule 3   sodium chloride (OCEAN) 0.65 % SOLN nasal spray Place 2 sprays into both nostrils daily as needed for congestion. 30 mL 11   tiZANidine (ZANAFLEX) 4 MG tablet Take 4 mg by mouth at bedtime.     traMADol (ULTRAM) 50 MG tablet Take 1-2 tablets (50-100 mg total) by mouth every 6 (six) hours as needed. 20 tablet 0   traMADol (ULTRAM) 50 MG tablet Take by mouth.     traZODone (DESYREL) 50 MG tablet Take 1-1.5 tablets (50-75 mg total) by mouth at bedtime as needed for  sleep. 140 tablet 11   triamcinolone cream (KENALOG) 0.5 % Apply topically 2 (two) times daily. 60 g 0   valACYclovir (VALTREX) 1000 MG tablet Take 1 tablet (1,000 mg total) by mouth daily. Suppression. BID X 5 days with outbreak as needed 120 tablet 3   No current facility-administered medications for this visit.    Allergies  Allergen Reactions   Hydrocodone-Acetaminophen Hives    All over her body.   Lactose Intolerance (Gi) Other (See Comments)    Bloating and GI upset   Penicillins Rash    Did it involve swelling of the face/tongue/throat, SOB, or low BP? Unknown Did it involve sudden or severe rash/hives, skin peeling, or any reaction on the inside of your mouth or nose? Yes Did you need to seek medical attention at a hospital or doctor's office? Unknown When did it last happen? Childhood reaction.      If all above answers are "NO", may proceed with cephalosporin use.    Sulfa Antibiotics Rash    Full body rash     Diagnoses:  Other specified anxiety disorders  Plan of Care: Continued counseling.   Narrative:   Leah [name] participated from office, via video, and consented to treatment. Therapist participated from office. We met online due to [place] pandemic.  [place]. Son lives with [name]. Works part-time History of cancer with continued follow-up.   Current stressors include finances due to move and unexpected bills.   Need to engage in exercise and self-care, manage mood, manage interpersonal work stressors, increasing mindfulness, and govern physical, psychological, and emotional output.       11/23/2021    8:25 AM 05/18/2021    8:59 AM  GAD 7 : Generalized Anxiety Score  Nervous, Anxious, on Edge 0 2  Control/stop worrying 0 3  Worry too much - different things 0 3  Trouble relaxing 0 2  Restless 1 1  Easily annoyed or irritable 0 1  Afraid - awful might happen 0 2  Total GAD 7 Score 1 14  Anxiety Difficulty Not  difficult at all  Somewhat difficult      11/23/2021    8:23 AM 11/09/2021    1:47 PM 08/20/2021    8:17 AM  PHQ9 SCORE ONLY  PHQ-9 Total Score 0 1 1     We reviewed numerous treatment approaches including CBT, BA, Problem Solving, and Solution focused therapy. Psych-education regarding the Dhruvi's diagnosis of Other specified anxiety disorders was provided during the session. We discussed [facility] goals treatment goals which include managing symptoms, processing past events, dealing with interpersonal stressors, and engage in self-care consistently. Leah [name] provided verbal approval of the treatment plan.   Interventions: Psycho-education & Goal Setting.   Diagnosis:  Other specified anxiety disorders  Treatment Plan:  Client Abilities/Strengths Maddox forthcoming and motivated for change.   Support System: Friends and family.   Client Treatment Preferences Outpatient therapy.   Client Statement of Needs Anahla would like to managing symptoms, processing past events, dealing with interpersonal stressors, and engage in self-care consistently.   Treatment Level Biweekly  Symptoms  Depression: hx of sadness, hx of grief   (Status: maintained) Anxiety: HX of panic, tension, anxiety,   (Status: maintained)  Goals:   Katieann experiences symptoms of anxiety and depression.    Target Date: 11/24/22 Frequency: Biweekly  Progress: 0 Modality: individual    Therapist will provide referrals for additional resources as appropriate.  Therapist will provide psycho-education regarding Gelila's diagnosis and corresponding treatment approaches and interventions. Licensed Clinical Social Worker, [place], LCSW will support the patient's ability to achieve the goals identified. will employ CBT, BA, Problem-solving, Solution Focused, Mindfulness,  coping skills, & other evidenced-based practices will be used to promote progress towards healthy functioning to help manage decrease symptoms  associated with her diagnosis.   Reduce overall level, frequency, and intensity of the feelings of depression, anxiety and panic evidenced by decreased overlal symptoms from 6 to 7 days/week to 0 to 1 days/week per client report for at least 3 consecutive months. Verbally express understanding of the relationship between feelings of depression, anxiety and their impact on thinking patterns and behaviors. Verbalize an understanding of the role that distorted thinking plays in creating fears, excessive worry, and ruminations.    [name] participated in the creation of the treatment plan)  [name], LCSW

## 2021-11-24 ENCOUNTER — Ambulatory Visit
Admission: RE | Admit: 2021-11-24 | Discharge: 2021-11-24 | Disposition: A | Discharge status: Home / Self Care | Payer: Medicaid Other | Source: Ambulatory Visit | Attending: Internal Medicine | Admitting: Internal Medicine

## 2021-11-24 DIAGNOSIS — C50912 Malignant neoplasm of unspecified site of left female breast: Secondary | ICD-10-CM | POA: Insufficient documentation

## 2021-11-24 DIAGNOSIS — R922 Inconclusive mammogram: Secondary | ICD-10-CM | POA: Diagnosis not present

## 2021-11-25 ENCOUNTER — Other Ambulatory Visit: Payer: Self-pay | Admitting: *Deleted

## 2021-11-25 DIAGNOSIS — C50212 Malignant neoplasm of upper-inner quadrant of left female breast: Secondary | ICD-10-CM

## 2021-11-26 ENCOUNTER — Encounter: Payer: Self-pay | Admitting: Medical Oncology

## 2021-11-26 ENCOUNTER — Inpatient Hospital Stay: Payer: Medicaid Other | Attending: Oncology | Admitting: Medical Oncology

## 2021-11-26 ENCOUNTER — Inpatient Hospital Stay: Payer: Medicaid Other

## 2021-11-26 VITALS — BP 133/77 | HR 89 | Temp 98.7°F | Wt 175.0 lb

## 2021-11-26 DIAGNOSIS — Z9221 Personal history of antineoplastic chemotherapy: Secondary | ICD-10-CM | POA: Diagnosis not present

## 2021-11-26 DIAGNOSIS — Z803 Family history of malignant neoplasm of breast: Secondary | ICD-10-CM | POA: Insufficient documentation

## 2021-11-26 DIAGNOSIS — Z170 Estrogen receptor positive status [ER+]: Secondary | ICD-10-CM | POA: Diagnosis not present

## 2021-11-26 DIAGNOSIS — C50212 Malignant neoplasm of upper-inner quadrant of left female breast: Secondary | ICD-10-CM | POA: Insufficient documentation

## 2021-11-26 DIAGNOSIS — Z7983 Long term (current) use of bisphosphonates: Secondary | ICD-10-CM

## 2021-11-26 DIAGNOSIS — Z923 Personal history of irradiation: Secondary | ICD-10-CM | POA: Diagnosis not present

## 2021-11-26 DIAGNOSIS — Z8719 Personal history of other diseases of the digestive system: Secondary | ICD-10-CM | POA: Insufficient documentation

## 2021-11-26 DIAGNOSIS — Z5181 Encounter for therapeutic drug level monitoring: Secondary | ICD-10-CM

## 2021-11-26 DIAGNOSIS — Z8042 Family history of malignant neoplasm of prostate: Secondary | ICD-10-CM | POA: Insufficient documentation

## 2021-11-26 DIAGNOSIS — Z79811 Long term (current) use of aromatase inhibitors: Secondary | ICD-10-CM | POA: Diagnosis not present

## 2021-11-26 DIAGNOSIS — Z79899 Other long term (current) drug therapy: Secondary | ICD-10-CM | POA: Insufficient documentation

## 2021-11-26 DIAGNOSIS — Z87891 Personal history of nicotine dependence: Secondary | ICD-10-CM | POA: Diagnosis not present

## 2021-11-26 DIAGNOSIS — R232 Flushing: Secondary | ICD-10-CM | POA: Insufficient documentation

## 2021-11-26 DIAGNOSIS — G629 Polyneuropathy, unspecified: Secondary | ICD-10-CM | POA: Insufficient documentation

## 2021-11-26 LAB — CBC WITH DIFFERENTIAL/PLATELET
Abs Immature Granulocytes: 0.36 10*3/uL — ABNORMAL HIGH (ref 0.00–0.07)
Basophils Absolute: 0.1 10*3/uL (ref 0.0–0.1)
Basophils Relative: 1 %
Eosinophils Absolute: 0.3 10*3/uL (ref 0.0–0.5)
Eosinophils Relative: 3 %
HCT: 42.4 % (ref 36.0–46.0)
Hemoglobin: 14.8 g/dL (ref 12.0–15.0)
Immature Granulocytes: 4 %
Lymphocytes Relative: 12 %
Lymphs Abs: 1.1 10*3/uL (ref 0.7–4.0)
MCH: 32.5 pg (ref 26.0–34.0)
MCHC: 34.9 g/dL (ref 30.0–36.0)
MCV: 93 fL (ref 80.0–100.0)
Monocytes Absolute: 0.7 10*3/uL (ref 0.1–1.0)
Monocytes Relative: 8 %
Neutro Abs: 6.5 10*3/uL (ref 1.7–7.7)
Neutrophils Relative %: 72 %
Platelets: 309 10*3/uL (ref 150–400)
RBC: 4.56 MIL/uL (ref 3.87–5.11)
RDW: 11.6 % (ref 11.5–15.5)
WBC: 9.1 10*3/uL (ref 4.0–10.5)
nRBC: 0 % (ref 0.0–0.2)

## 2021-11-26 LAB — COMPREHENSIVE METABOLIC PANEL
ALT: 17 U/L (ref 0–44)
AST: 22 U/L (ref 15–41)
Albumin: 3.4 g/dL — ABNORMAL LOW (ref 3.5–5.0)
Alkaline Phosphatase: 55 U/L (ref 38–126)
Anion gap: 6 (ref 5–15)
BUN: 17 mg/dL (ref 6–20)
CO2: 31 mmol/L (ref 22–32)
Calcium: 8.7 mg/dL — ABNORMAL LOW (ref 8.9–10.3)
Chloride: 103 mmol/L (ref 98–111)
Creatinine, Ser: 0.8 mg/dL (ref 0.44–1.00)
GFR, Estimated: >60 mL/min (ref >60)
Glucose, Bld: 139 mg/dL — ABNORMAL HIGH (ref 70–99)
Potassium: 3.7 mmol/L (ref 3.5–5.1)
Sodium: 140 mmol/L (ref 135–145)
Total Bilirubin: 0.7 mg/dL (ref 0.3–1.2)
Total Protein: 6.5 g/dL (ref 6.5–8.1)

## 2021-11-26 MED ORDER — ZOLEDRONIC ACID 4 MG/100ML IV SOLN
4.0000 mg | INTRAVENOUS | Status: DC
  Administered 2021-11-26: 4 mg via INTRAVENOUS
  Filled 2021-11-26: qty 100

## 2021-11-26 MED ORDER — SODIUM CHLORIDE 0.9 % IV SOLN
INTRAVENOUS | Status: DC
  Filled 2021-11-26: qty 250

## 2021-11-26 NOTE — Progress Notes (Signed)
Hematology/Oncology Consult note [facility]  [phone] [phone]  Patient Care Team: McLean-Scocuzza, Nino Glow, MD as PCP - General (Internal Medicine) Jovita Kussmaul, MD as Consulting Physician (General Surgery) Noreene Filbert, MD as Referring Physician (Radiation Oncology) Rico Junker, RN as Registered Nurse Sindy Guadeloupe, MD as Consulting Physician (Oncology)   Name of the patient: Leah [name]  [id number]  08/11/1965   Date of visit: 11/26/21  Diagnosis-  pathological prognostic stage Ia invasive mammary carcinoma pT1b pN1 cM0 ER/PR positive HER-2/neu negative s/p lumpectomy, adjuvant chemotherapy and radiation currently on Arimidex   Chief complaint/ Reason for visit-routine follow-up of breast cancer on Arimidex   Heme/Onc history: Patient is a 56 year old postmenopausal female G2 P2 L2 who recently underwent screening bilateral mammogram which showed area of concern in her left breast.  This was followed by an ultrasound and Core biopsy.  Ultrasound showed 1 x 1.1 x 0.7 cm hypoechoic mass at the 10:30 position of the left breast.  Left axilla appeared normal.  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    Final pathology showed 9 mm grade 1 invasive mammary carcinoma 1 out of 2 lymph nodes involved with macro metastatic carcinoma 3.5 mm.  Anterior margin was focally positive for which patient underwent reexcision surgery with negative margins [id number] (sn)   MammaPrint score came back as high risk with a risk of recurrence of 29% at 10 years without chemotherapy   Patient completed 4 cycles of dose dense AC chemotherapy and 12 cycles of weekly Taxol.  She was started on letrozole in August 2021 but could not tolerate it due to diarrhea and is currently on Arimidex  Interval history-  Today she reports that she is doing really well. She states that her  neuropathy and hot flashes are greatly improved on the Cymbalta. She is tolerating her Arimidex well with occasional hot flashes as her only side effect. She is finally getting over a bout of bronchitis but otherwise is feeling great.   ECOG PS- 0 Pain scale- 0   Review of systems- Review of Systems  Constitutional:  Negative for chills, fever, malaise/fatigue and weight loss.  HENT:  Negative for congestion, ear pain and tinnitus.   Eyes: Negative.  Negative for blurred vision and double vision.  Respiratory: Negative.  Negative for cough, sputum production and shortness of breath.   Cardiovascular: Negative.  Negative for chest pain, palpitations and leg swelling.  Gastrointestinal: Negative.  Negative for abdominal pain, constipation, diarrhea, nausea and vomiting.  Genitourinary:  Negative for dysuria, frequency and urgency.  Musculoskeletal:  Negative for back pain, falls and joint pain.  Skin: Negative.  Negative for rash.  Neurological:  Negative for dizziness, weakness and headaches.  Endo/Heme/Allergies: Negative.  Does not bruise/bleed easily.  Psychiatric/Behavioral: Negative.  Negative for depression. The patient is not nervous/anxious and does not have insomnia.       Allergies  Allergen Reactions   Hydrocodone-Acetaminophen Hives    All over her body.   Lactose Intolerance (Gi) Other (See Comments)    Bloating and GI upset   Penicillins Rash    Did it involve swelling of the face/tongue/throat, SOB, or low BP? Unknown Did it involve sudden or severe rash/hives, skin peeling, or any reaction on the inside of your mouth or nose? Yes Did you need to seek medical attention at a hospital or doctor's office? Unknown When did it  last happen? Childhood reaction.      If all above answers are "NO", may proceed with cephalosporin use.    Sulfa Antibiotics Rash    Full body rash      Past Medical History:  Diagnosis Date   Anxiety    Asthma    as a child   Breast  cancer ([place])    Carpal tunnel syndrome    R hand    Colon polyps    Depression    Eosinophilic esophagitis    noted in [place] with GI path report 09/29/14    Esophageal stricture    s/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 Edwin Cap    Family history of breast cancer    Family history of prostate cancer    Herniated disc, cervical    s/p epidural injection   Herpes    History of kidney stones    Hyperlipidemia    Low back pain    Migraines    Personal history of chemotherapy    Personal history of radiation therapy      Past Surgical History:  Procedure Laterality Date   BREAST BIOPSY Left 12/25/2018   [place] BX, invasive mammary carcinoma    BREAST LUMPECTOMY     BREAST LUMPECTOMY WITH NEEDLE LOCALIZATION AND AXILLARY SENTINEL LYMPH NODE BX Left 01/16/2019   Procedure: LEFT BREAST LUMPECTOMY WITH NEEDLE LOCALIZATION AND SENTINEL LYMPH NODE BIOPSY;  Surgeon: Jovita Kussmaul, MD;  Location: [facility] ORS;  Service: General;  Laterality: Left;   CESAREAN SECTION     COLONOSCOPY WITH PROPOFOL N/A 03/27/2017   Procedure: COLONOSCOPY WITH PROPOFOL;  Surgeon: Jonathon Bellows, MD;  Location: [facility] ENDOSCOPY;  Service: Gastroenterology;  Laterality: N/A;   ESOPHAGOGASTRODUODENOSCOPY     ESOPHAGOGASTRODUODENOSCOPY (EGD) WITH PROPOFOL N/A 03/27/2017   Procedure: ESOPHAGOGASTRODUODENOSCOPY (EGD) WITH PROPOFOL WITH DILATION;  Surgeon: Jonathon Bellows, MD;  Location: [facility] ENDOSCOPY;  Service: Gastroenterology;  Laterality: N/A;   PORTACATH PLACEMENT N/A 02/15/2019   Procedure: INSERTION PORT-A-CATH WITH POSSIBLE ULTRASOUND;  Surgeon: Jovita Kussmaul, MD;  Location: [facility] ORS;  Service: General;  Laterality: N/A;   RE-EXCISION OF BREAST CANCER,SUPERIOR MARGINS Left 02/15/2019   Procedure: RE-EXCISION OF LEFT BREAST CANCER ANTERIOR MARGINS;  Surgeon: Jovita Kussmaul, MD;  Location: [facility] ORS;  Service: General;  Laterality: Left;   THROAT SURGERY  4132   UMBILICAL HERNIA REPAIR  2006    x 2   w  mesh per pt    Social History   Socioeconomic History   Marital status: Single    Spouse name: Not on file   Number of children: Not on file   Years of education: Not on file   Highest education level: Not on file  Occupational History   Not on file  Tobacco Use   Smoking status: Former    Packs/day: 1.00    Years: 20.00    Total pack years: 20.00    Types: Cigarettes    Quit date: 06/02/2006    Years since quitting: 15.4   Smokeless tobacco: Never  Vaping Use   Vaping Use: Never used  Substance and Sexual Activity   Alcohol use: Not Currently   Drug use: No   Sexual activity: Yes    Partners: Male  Other Topics Concern   Not on file  Social History Narrative   Works in Scientist, 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 As of 02/26/17    Divorced.    Former smoker 20+ years quit in 1997 then started again for 5 years and quit 10-12 years ago as of 03/08/17    Social Determinants of Radio broadcast assistant Strain: Not on file  Food Insecurity: Not on file  Transportation Needs: Not on file  Physical Activity: Not on file  Stress: Not on file  Social Connections: Not on file  Intimate Partner Violence: Not on file    Family History  Problem Relation Age of Onset   Breast cancer Mother        dx late 93s   Thyroid disease Mother    Colon polyps Mother    Prostate cancer Father        dx 69   Breast cancer Maternal Grandmother        dx late 25s   Osteoporosis Maternal Grandmother    Cancer Maternal Uncle        possibly, unsure type     Current Outpatient Medications:    acetaminophen (TYLENOL) 500 MG tablet, Take 500-1,000 mg by mouth every 6 (six) hours as needed (for pain.)., Disp: , Rfl:    albuterol (VENTOLIN HFA) 108 (90 Base) MCG/ACT inhaler, Inhale 2 puffs into the lungs every 6 (six) hours as needed for wheezing or shortness of breath., Disp: 18 g, Rfl: 11   anastrozole  (ARIMIDEX) 1 MG tablet, TAKE 1 TABLET BY MOUTH EVERY DAY, Disp: 90 tablet, Rfl: 2   atorvastatin (LIPITOR) 40 MG tablet, Take 1 tablet (40 mg total) by mouth daily., Disp: 90 tablet, Rfl: 3   benzonatate (TESSALON) 200 MG capsule, Take 200 mg by mouth 3 (three) times daily as needed., Disp: , Rfl:    cetirizine (ZYRTEC) 10 MG tablet, Take 1 tablet (10 mg total) by mouth daily as needed for allergies., Disp: 90 tablet, Rfl: 3   Cholecalciferol (VITAMIN D-3) 125 MCG (5000 UT) TABS, Take 5,000 Units by mouth daily., Disp: , Rfl:    Cyanocobalamin (VITAMIN B-12 PO), Take 1 tablet by mouth daily. 1027m daily, Disp: , Rfl:    doxycycline (VIBRA-TABS) 100 MG tablet, Take 1 tablet (100 mg total) by mouth 2 (two) times daily. With food x 1 week, Disp: 14 tablet, Rfl: 0   DULoxetine (CYMBALTA) 60 MG capsule, TAKE 1 CAPSULE BY MOUTH EVERY DAY, Disp: 90 capsule, Rfl: 1   fluticasone (FLONASE) 50 MCG/ACT nasal spray, Place 2 sprays into both nostrils daily. Max 2 sprays, Disp: 16 g, Rfl: 12   gabapentin (NEURONTIN) 300 MG capsule, TAKE 1 CAPSULE BY MOUTH THREE TIMES A DAY, Disp: 90 capsule, Rfl: 2   meclizine (ANTIVERT) 12.5 MG tablet, TAKE 1 TABLET BY MOUTH 3 TIMES DAILY AS NEEDED FOR DIZZINESS., Disp: 90 tablet, Rfl: 3   meloxicam (MOBIC) 15 MG tablet, Take 1 tablet (15 mg total) by mouth daily as needed for pain., Disp: 90 tablet, Rfl: 3   methocarbamol (ROBAXIN) 750 MG tablet, Take 1 tablet (750 mg total) by mouth every 6 (six) hours as needed for muscle spasms., Disp: 120 tablet, Rfl: 5   omeprazole (PRILOSEC) 40 MG capsule, Take 1 capsule (40 mg total) by mouth every evening. 30 min before food, Disp: 90 capsule, Rfl: 3   sodium chloride (OCEAN) 0.65 % SOLN nasal spray, Place 2 sprays into both nostrils daily as needed for congestion., Disp: 30 mL, Rfl: 11   tiZANidine (ZANAFLEX) 4 MG tablet, Take 4 mg by mouth at  bedtime., Disp: , Rfl:    traMADol (ULTRAM) 50 MG tablet, Take 1-2 tablets (50-100 mg total)  by mouth every 6 (six) hours as needed., Disp: 20 tablet, Rfl: 0   traMADol (ULTRAM) 50 MG tablet, Take by mouth., Disp: , Rfl:    traZODone (DESYREL) 50 MG tablet, Take 1-1.5 tablets (50-75 mg total) by mouth at bedtime as needed for sleep., Disp: 140 tablet, Rfl: 11   triamcinolone cream (KENALOG) 0.5 %, Apply topically 2 (two) times daily., Disp: 60 g, Rfl: 0   valACYclovir (VALTREX) 1000 MG tablet, Take 1 tablet (1,000 mg total) by mouth daily. Suppression. BID X 5 days with outbreak as needed, Disp: 120 tablet, Rfl: 3 No current facility-administered medications for this visit.  Facility-Administered Medications Ordered in Other Visits:    0.9 %  sodium chloride infusion, , Intravenous, Continuous, Sindy Guadeloupe, MD, Stopped at 11/26/21 1505   Zoledronic Acid (ZOMETA) IVPB 4 mg, 4 mg, Intravenous, Q6 months, Sindy Guadeloupe, MD, Stopped at 11/26/21 1502  Physical exam:  Vitals:   11/26/21 1353  BP: 133/77  Pulse: 89  Temp: 98.7 F (37.1 C)  TempSrc: Tympanic  Weight: 175 lb (79.4 kg)   Physical Exam Constitutional:      Appearance: Normal appearance.  HENT:     Head: Normocephalic and atraumatic.  Eyes:     Pupils: Pupils are equal, round, and reactive to light.  Cardiovascular:     Rate and Rhythm: Normal rate and regular rhythm.     Heart sounds: Normal heart sounds. No murmur heard. Pulmonary:     Effort: Pulmonary effort is normal.     Breath sounds: Normal breath sounds. No wheezing.  Abdominal:     General: Bowel sounds are normal. There is no distension.     Palpations: Abdomen is soft.     Tenderness: There is no abdominal tenderness.  Musculoskeletal:        General: Normal range of motion.     Cervical back: Normal range of motion.  Skin:    General: Skin is warm and dry.     Findings: No rash.  Neurological:     Mental Status: She is alert and oriented to person, place, and time.  Psychiatric:        Judgment: Judgment normal.   Breast: Deferred by  patient       Latest Ref Rng & Units 11/26/2021    1:43 PM  CMP  Glucose 70 - 99 mg/dL 139   BUN 6 - 20 mg/dL 17   Creatinine 0.44 - 1.00 mg/dL 0.80   Sodium 135 - 145 mmol/L 140   Potassium 3.5 - 5.1 mmol/L 3.7   Chloride 98 - 111 mmol/L 103   CO2 22 - 32 mmol/L 31   Calcium 8.9 - 10.3 mg/dL 8.7   Total Protein 6.5 - 8.1 g/dL 6.5   Total Bilirubin 0.3 - 1.2 mg/dL 0.7   Alkaline Phos 38 - 126 U/L 55   AST 15 - 41 U/L 22   ALT 0 - 44 U/L 17       Latest Ref Rng & Units 11/26/2021    1:43 PM  CBC  WBC 4.0 - 10.5 K/uL 9.1   Hemoglobin 12.0 - 15.0 g/dL 14.8   Hematocrit 36.0 - 46.0 % 42.4   Platelets 150 - 400 K/uL 309      Assessment and plan- Patient is a 56 y.o. female with prior history of polycythemia now with history of  invasive Stage Ia left breast cancer ER/PR positive HER2/neu negative. She had a lumpectomy and High risk MammaPrint score.  She is status post 4 cycles of dose dense AC and 12 cycles of Taxol chemotherapy. She also completed XRT in August 2021.  She was started on Arimidex (Aug 2021) which she is tolerating well.  Today she is doing well. We reviewed her labs from today as well as her recent Mammogram (Bi-RADS 2)and DEXA both of which were completed recently. Her mammogram was performed on 11/24/2021 and her DEXA was performed on 11/09/2021. DEXA went from -1.2 to -1.3 over 2 years. She will need repeat DEXA in 1 year. Zometa today- calcium slightly low but acceptable for treatment. She will start calcium supplementation- already taking vitamin D. She will need an updated mammogram in 1 year. Return to clinic in 6 months with repeat lab work (CBC, [place]) and Zometa.  I spent 25 minutes dedicated to the care of this patient (face-to-face and non-face-to-face) on the date of the encounter to include what is described in the assessment and plan.    Visit Diagnosis 1. Malignant neoplasm of upper-inner quadrant of left breast in female, estrogen receptor positive  ([place])   2. Aromatase inhibitor use   3. Encounter for monitoring zoledronic acid therapy   4. Visit for monitoring Arimidex therapy     Nelwyn Salisbury PA-C 11/26/2021 3:49 PM

## 2021-12-21 ENCOUNTER — Ambulatory Visit (INDEPENDENT_AMBULATORY_CARE_PROVIDER_SITE_OTHER): Payer: Medicaid Other | Admitting: Psychology

## 2021-12-21 DIAGNOSIS — F418 Other specified anxiety disorders: Secondary | ICD-10-CM

## 2021-12-21 NOTE — Progress Notes (Signed)
[facility] Counselor/Therapist Progress Note  Patient ID: Leah [name], MRN: [id number]    Date: 12/21/21  Time Spent: 8:02 am -  8:57 am : 68 Minutes  Treatment Type: Individual Therapy.  Reported Symptoms: Anxiety, grief and loss.   Mental Status Exam: Appearance:  Neat and Well Groomed     Behavior: Appropriate  Motor: Normal  Speech/Language:  Clear and Coherent  Affect: Appropriate  Mood: normal  Thought process: normal  Thought content:   WNL  Sensory/Perceptual disturbances:   WNL  Orientation: oriented to person, place, time/date, and situation  Attention: Good  Concentration: Good  Memory: [place] of knowledge:  Good  Insight:   Good  Judgment:  Good  Impulse Control: Good   Risk Assessment: Danger to Self:  No Self-injurious Behavior: No Danger to Others: No Duty to Warn:no Physical Aggression / Violence:No  Access to Firearms a concern: No  Gang Involvement:No 8  Subjective:   [name] participated from home, via video, and consented to treatment. Therapist participated from office. We met online due to [place] pandemic. Frona reviewed the events of the past week. She noted her engagement in exercise between sessions and noted feeling positive about her experience. She noted her continued fluctuating sleep but noted effort to get to bed at a reasonable time. We worked on identifying possible differences between nights with restful sleep and nights with poor sleep. She noted stress being a primary barrier to restful sleep. She noted a need for her son to financially contribute and noted worry about how to communicate this going forward. We worked on identifying ways to communicate to her son assertively, positively, and use empathy. Liylah was engaged and motivated during the session. She expressed commitment towards our goals. Therapist praised Maille and provided supportive therapy.     Interventions: Cognitive Behavioral Therapy &  interpresonal  Diagnosis:  Other specified anxiety disorders  Treatment Plan:  Client Abilities/Strengths [place] forthcoming and motivated for change.   Support System: Friends and family.   Client Treatment Preferences Outpatient therapy.   Client Statement of Needs Makeya would like to managing symptoms, processing past events, dealing with interpersonal stressors, and engage in self-care consistently.   Treatment Level Biweekly  Symptoms  Depression: hx of sadness, hx of grief   (Status: maintained) Anxiety: HX of panic, tension, anxiety,   (Status: maintained)  Goals:   Lynde experiences symptoms of anxiety and depression.    Target Date: 11/24/22 Frequency: Biweekly  Progress: 0 Modality: individual    Therapist will provide referrals for additional resources as appropriate.  Therapist will provide psycho-education regarding Dovie's diagnosis and corresponding treatment approaches and interventions. Licensed Clinical Social Worker, [place], LCSW will support the patient's ability to achieve the goals identified. will employ CBT, BA, Problem-solving, Solution Focused, Mindfulness,  coping skills, & other evidenced-based practices will be used to promote progress towards healthy functioning to help manage decrease symptoms associated with her diagnosis.   Reduce overall level, frequency, and intensity of the feelings of depression, anxiety and panic evidenced by decreased overlal symptoms from 6 to 7 days/week to 0 to 1 days/week per client report for at least 3 consecutive months. Verbally express understanding of the relationship between feelings of depression, anxiety and their impact on thinking patterns and behaviors. Verbalize an understanding of the role that distorted thinking plays in creating fears, excessive worry, and ruminations.    [name] participated in the creation of the treatment plan)  [name], LCSW

## 2021-12-22 ENCOUNTER — Ambulatory Visit
Admission: RE | Admit: 2021-12-22 | Discharge: 2021-12-22 | Disposition: A | Discharge status: Home / Self Care | Payer: Medicaid Other | Source: Ambulatory Visit | Attending: Radiation Oncology | Admitting: Radiation Oncology

## 2021-12-22 VITALS — BP 130/87 | HR 79 | Temp 96.8°F | Resp 16 | Ht 64.5 in | Wt 183.8 lb

## 2021-12-22 DIAGNOSIS — Z170 Estrogen receptor positive status [ER+]: Secondary | ICD-10-CM | POA: Insufficient documentation

## 2021-12-22 DIAGNOSIS — Z79811 Long term (current) use of aromatase inhibitors: Secondary | ICD-10-CM | POA: Diagnosis not present

## 2021-12-22 DIAGNOSIS — C50212 Malignant neoplasm of upper-inner quadrant of left female breast: Secondary | ICD-10-CM | POA: Diagnosis not present

## 2021-12-22 DIAGNOSIS — Z923 Personal history of irradiation: Secondary | ICD-10-CM | POA: Diagnosis not present

## 2021-12-22 NOTE — Progress Notes (Signed)
Radiation Oncology Follow up Note  Name: Leah [name]   Date:   12/22/2021 MRN:  [id number] DOB: [birth year]    This 56 y.o. female presents to the clinic today for 2-year follow-up status post whole breast radiation to her left breast for stage Ia (T1b N1 M0) ER/PR positive invasive mammary carcinoma.  REFERRING PROVIDER: McLean-Scocuzza, Olivia Mackie *  HPI: Patient is a 56 year old female now out 2 years having a pleated whole breast radiation to her left breast for stage Ia ER/PR positive invasive mammary carcinoma.  She is seen today in routine follow-up and is doing well.  She specifically denies breast tenderness cough or bone pain..  She recently had mammogram which I have reviewed which were BI-RADS 2 benign.  She is currently on Arimidex tolerating well without side effect.  COMPLICATIONS OF TREATMENT: none  FOLLOW UP COMPLIANCE: keeps appointments   PHYSICAL EXAM:  BP 130/87   Pulse 79   Temp (!) 96.8 F (36 C)   Resp 16   Ht 5' 4.5" (1.638 m)   Wt 183 lb 12.8 oz (83.4 kg)   BMI 31.06 kg/m  Lungs are clear to A&P cardiac examination essentially unremarkable with regular rate and rhythm. No dominant mass or nodularity is noted in either breast in 2 positions examined. Incision is well-healed. No axillary or supraclavicular adenopathy is appreciated. Cosmetic result is excellent.  Well-developed well-nourished patient in NAD. HEENT reveals PERLA, EOMI, discs not visualized.  Oral cavity is clear. No oral mucosal lesions are identified. Neck is clear without evidence of cervical or supraclavicular adenopathy. Lungs are clear to A&P. Cardiac examination is essentially unremarkable with regular rate and rhythm without murmur rub or thrill. Abdomen is benign with no organomegaly or masses noted. Motor sensory and DTR levels are equal and symmetric in the upper and lower extremities. Cranial nerves II through XII are grossly intact. Proprioception is intact. No peripheral adenopathy or  edema is identified. No motor or sensory levels are noted. Crude visual fields are within normal range.  RADIOLOGY RESULTS: Mammograms reviewed compatible with above-stated findings  PLAN: Present time patient is doing well 2 years out with no evidence of disease.  I am pleased with her overall progress.  I have asked to see her back in 1 year for follow-up.  Patient is to call with any concerns.  She continues on Arimidex without side effect.  I would like to take this opportunity to thank you for allowing me to participate in the care of your patient.Noreene Filbert, MD

## 2021-12-30 ENCOUNTER — Encounter: Payer: Self-pay | Admitting: Oncology

## 2022-01-06 ENCOUNTER — Telehealth: Payer: Self-pay | Admitting: *Deleted

## 2022-01-06 ENCOUNTER — Other Ambulatory Visit: Payer: Self-pay | Admitting: *Deleted

## 2022-01-06 DIAGNOSIS — Z86010 Personal history of colonic polyps: Secondary | ICD-10-CM

## 2022-01-06 MED ORDER — NA SULFATE-K SULFATE-MG SULF 17.5-3.13-1.6 GM/177ML PO SOLN: ORAL | 0 refills | Status: DC

## 2022-01-06 NOTE — Telephone Encounter (Signed)
Gastroenterology Pre-Procedure Review  Request Date: 04/04/2022 Requesting Physician: Dr.   PATIENT REVIEW QUESTIONS: The patient responded to the following health history questions as indicated:    1. Are you having any GI issues? no 2. Do you have a personal history of Polyps? yes (last colonoscopy 03/27/2017) 3. Do you have a family history of Colon Cancer or Polyps? yes (mom and grandmother had polyps) 4. Diabetes Mellitus? no 5. Joint replacements in the past 12 months?no 6. Major health problems in the past 3 months?no 7. Any artificial heart valves, MVP, or defibrillator?no    MEDICATIONS & ALLERGIES:    Patient reports the following regarding taking any anticoagulation/antiplatelet therapy:   Plavix, Coumadin, Eliquis, Xarelto, Lovenox, Pradaxa, Brilinta, or Effient? no Aspirin? no  Patient confirms/reports the following medications:  Current Outpatient Medications  Medication Sig Dispense Refill   acetaminophen (TYLENOL) 500 MG tablet Take 500-1,000 mg by mouth every 6 (six) hours as needed (for pain.).     albuterol (VENTOLIN HFA) 108 (90 Base) MCG/ACT inhaler Inhale 2 puffs into the lungs every 6 (six) hours as needed for wheezing or shortness of breath. 18 g 11   anastrozole (ARIMIDEX) 1 MG tablet TAKE 1 TABLET BY MOUTH EVERY DAY 90 tablet 2   atorvastatin (LIPITOR) 40 MG tablet Take 1 tablet (40 mg total) by mouth daily. 90 tablet 3   benzonatate (TESSALON) 200 MG capsule Take 200 mg by mouth 3 (three) times daily as needed.     cetirizine (ZYRTEC) 10 MG tablet Take 1 tablet (10 mg total) by mouth daily as needed for allergies. 90 tablet 3   Cholecalciferol (VITAMIN D-3) 125 MCG (5000 UT) TABS Take 5,000 Units by mouth daily.     Cyanocobalamin (VITAMIN B-12 PO) Take 1 tablet by mouth daily. '1000mg'$  daily     doxycycline (VIBRA-TABS) 100 MG tablet Take 1 tablet (100 mg total) by mouth 2 (two) times daily. With food x 1 week 14 tablet 0   DULoxetine (CYMBALTA) 60 MG capsule  TAKE 1 CAPSULE BY MOUTH EVERY DAY 90 capsule 1   fluticasone (FLONASE) 50 MCG/ACT nasal spray Place 2 sprays into both nostrils daily. Max 2 sprays 16 g 12   gabapentin (NEURONTIN) 300 MG capsule TAKE 1 CAPSULE BY MOUTH THREE TIMES A DAY 90 capsule 2   meclizine (ANTIVERT) 12.5 MG tablet TAKE 1 TABLET BY MOUTH 3 TIMES DAILY AS NEEDED FOR DIZZINESS. 90 tablet 3   meloxicam (MOBIC) 15 MG tablet Take 1 tablet (15 mg total) by mouth daily as needed for pain. 90 tablet 3   methocarbamol (ROBAXIN) 750 MG tablet Take 1 tablet (750 mg total) by mouth every 6 (six) hours as needed for muscle spasms. 120 tablet 5   omeprazole (PRILOSEC) 40 MG capsule Take 1 capsule (40 mg total) by mouth every evening. 30 min before food 90 capsule 3   sodium chloride (OCEAN) 0.65 % SOLN nasal spray Place 2 sprays into both nostrils daily as needed for congestion. 30 mL 11   tiZANidine (ZANAFLEX) 4 MG tablet Take 4 mg by mouth at bedtime.     traMADol (ULTRAM) 50 MG tablet Take 1-2 tablets (50-100 mg total) by mouth every 6 (six) hours as needed. 20 tablet 0   traMADol (ULTRAM) 50 MG tablet Take by mouth.     traZODone (DESYREL) 50 MG tablet Take 1-1.5 tablets (50-75 mg total) by mouth at bedtime as needed for sleep. 140 tablet 11   triamcinolone cream (KENALOG) 0.5 % Apply topically  2 (two) times daily. 60 g 0   valACYclovir (VALTREX) 1000 MG tablet Take 1 tablet (1,000 mg total) by mouth daily. Suppression. BID X 5 days with outbreak as needed 120 tablet 3   No current facility-administered medications for this visit.    Patient confirms/reports the following allergies:  Allergies  Allergen Reactions   Hydrocodone-Acetaminophen Hives    All over her body.   Lactose Intolerance (Gi) Other (See Comments)    Bloating and GI upset   Penicillins Rash    Did it involve swelling of the face/tongue/throat, SOB, or low BP? Unknown Did it involve sudden or severe rash/hives, skin peeling, or any reaction on the inside of  your mouth or nose? Yes Did you need to seek medical attention at a hospital or doctor's office? Unknown When did it last happen? Childhood reaction.      If all above answers are "NO", may proceed with cephalosporin use.    Sulfa Antibiotics Rash    Full body rash     No orders of the defined types were placed in this encounter.   AUTHORIZATION INFORMATION Primary Insurance: 1D#: Group #:  Secondary Insurance: 1D#: Group #:  SCHEDULE INFORMATION: Date: 04/04/2022 Time: Location: [facility]

## 2022-01-18 ENCOUNTER — Ambulatory Visit (INDEPENDENT_AMBULATORY_CARE_PROVIDER_SITE_OTHER): Payer: Medicaid Other | Admitting: Psychology

## 2022-01-18 DIAGNOSIS — F418 Other specified anxiety disorders: Secondary | ICD-10-CM | POA: Diagnosis not present

## 2022-01-18 NOTE — Progress Notes (Signed)
[facility] [facility] Counselor/Therapist Progress Note  Patient ID: Charnika Herbst, MRN: [id number]    Date: 01/18/22  Time Spent: 8:04 am -  8:47 am : 43 Minutes  Treatment Type: Individual Therapy.  Reported Symptoms: Anxiety, grief and loss.   Mental Status Exam: Appearance:  Neat and Well Groomed     Behavior: Appropriate  Motor: Normal  Speech/Language:  Clear and Coherent  Affect: Appropriate  Mood: normal  Thought process: normal  Thought content:   WNL  Sensory/Perceptual disturbances:   WNL  Orientation: oriented to person, place, time/date, and situation  Attention: Good  Concentration: Good  Memory: [place] of knowledge:  Good  Insight:   Good  Judgment:  Good  Impulse Control: Good   Risk Assessment: Danger to Self:  No Self-injurious Behavior: No Danger to Others: No Duty to Warn:no Physical Aggression / Violence:No  Access to Firearms a concern: No  Gang Involvement:No 8  Subjective:   [name] participated from home, via video, and consented to treatment. Therapist participated from office. We met online due to [place] pandemic. Flannery reviewed the events of the past week. She noted work related stressors due to [employer] and poor professionalism by her staff. She noted being cursed at after receiving feedback regarding poor performance. We explored this experience and discussed the effect of her work stressors on her mood. She noted working in adopting tools and skills to manage her sleep including using a sleep machine. She is working on managing her stress proactively and doing so daily. We processed her stressors at home and work. Therapist validated Mady's feelings and experience and provided supportive therapy. We scheduled a follow-up for continued treatment. Skyelyn continues to benefit from sessions.   Interventions: Cognitive Behavioral Therapy & interpresonal  Diagnosis:  Other specified anxiety disorders  Treatment  Plan:  Client Abilities/Strengths Britania forthcoming and motivated for change.   Support System: Friends and family.   Client Treatment Preferences Outpatient therapy.   Client Statement of Needs Kiandra would like to managing symptoms, processing past events, dealing with interpersonal stressors, and engage in self-care consistently.   Treatment Level Biweekly  Symptoms  Depression: hx of sadness, hx of grief   (Status: maintained) Anxiety: HX of panic, tension, anxiety,   (Status: maintained)  Goals:   Kaylani experiences symptoms of anxiety and depression.    Target Date: 11/24/22 Frequency: Biweekly  Progress: 0 Modality: individual    Therapist will provide referrals for additional resources as appropriate.  Therapist will provide psycho-education regarding Neveah's diagnosis and corresponding treatment approaches and interventions. Licensed Clinical Social Worker, [place], LCSW will support the patient's ability to achieve the goals identified. will employ CBT, BA, Problem-solving, Solution Focused, Mindfulness,  coping skills, & other evidenced-based practices will be used to promote progress towards healthy functioning to help manage decrease symptoms associated with her diagnosis.   Reduce overall level, frequency, and intensity of the feelings of depression, anxiety and panic evidenced by decreased overlal symptoms from 6 to 7 days/week to 0 to 1 days/week per client report for at least 3 consecutive months. Verbally express understanding of the relationship between feelings of depression, anxiety and their impact on thinking patterns and behaviors. Verbalize an understanding of the role that distorted thinking plays in creating fears, excessive worry, and ruminations.    [name] participated in the creation of the treatment plan)  [name], LCSW

## 2022-01-21 ENCOUNTER — Other Ambulatory Visit: Payer: Self-pay | Admitting: Oncology

## 2022-01-26 ENCOUNTER — Encounter: Payer: Self-pay | Admitting: *Deleted

## 2022-01-28 NOTE — Telephone Encounter (Signed)
If she is not taking trmadol ok to renew cymbalta. Does she want to see dr Mickeal Skinner?

## 2022-02-10 ENCOUNTER — Other Ambulatory Visit: Payer: Self-pay | Admitting: *Deleted

## 2022-02-10 DIAGNOSIS — M7989 Other specified soft tissue disorders: Secondary | ICD-10-CM

## 2022-02-10 DIAGNOSIS — Z170 Estrogen receptor positive status [ER+]: Secondary | ICD-10-CM

## 2022-02-14 ENCOUNTER — Other Ambulatory Visit: Payer: Self-pay | Admitting: *Deleted

## 2022-02-14 MED ORDER — ANASTROZOLE 1 MG PO TABS: 1.0000 mg | ORAL_TABLET | Freq: Every day | ORAL | 2 refills | Status: DC

## 2022-02-15 ENCOUNTER — Ambulatory Visit (INDEPENDENT_AMBULATORY_CARE_PROVIDER_SITE_OTHER): Payer: Medicaid Other | Admitting: Psychology

## 2022-02-15 DIAGNOSIS — F418 Other specified anxiety disorders: Secondary | ICD-10-CM | POA: Diagnosis not present

## 2022-02-15 NOTE — Progress Notes (Signed)
[facility] Counselor/Therapist Progress Note  Patient ID: Leah [name], MRN: [id number]    Date: 02/15/22  Time Spent: 4:09 pm -  4:45 : 36  Treatment Type: Individual Therapy.  Reported Symptoms: Anxiety, grief and loss.   Mental Status Exam: Appearance:  Neat and Well Groomed     Behavior: Appropriate  Motor: Normal  Speech/Language:  Clear and Coherent  Affect: Appropriate  Mood: normal  Thought process: normal  Thought content:   WNL  Sensory/Perceptual disturbances:   WNL  Orientation: oriented to person, place, time/date, and situation  Attention: Good  Concentration: Good  Memory: [place] of knowledge:  Good  Insight:   Good  Judgment:  Good  Impulse Control: Good   Risk Assessment: Danger to Self:  No Self-injurious Behavior: No Danger to Others: No Duty to Warn:no Physical Aggression / Violence:No  Access to Firearms a concern: No  Gang Involvement:No 8  Subjective:   Leah [name] participated from home, via video, and consented to treatment. Therapist participated from office. We met online due to [place] pandemic. Leah [name] reviewed the events of the past week. She noted things going well, overall. She noted an improvement in her work related stressors. She noted feeling somewhat overwhelmed due to finances and noted her efforts to get her son to get a job. She noted a need for change from him and not being able to identify the root of his objection. She noted not wanting to "hand hold". We continued to process her work stressors. She noted personal interpersonal stressors and noted her efforts to manage these stressors. She noted health related stressors, which be began to process.Therapist validated and normalized Leah [name]'s feelings and experience and provided supportive therapy. We scheduled a follow-up for continued treatment. Leah [name] continues to benefit from sessions.   Interventions: Cognitive Behavioral Therapy &  interpresonal  Diagnosis:  No diagnosis found.  Treatment Plan:  Client Abilities/Strengths Leah [name] forthcoming and motivated for change.   Support System: Friends and family.   Client Treatment Preferences Outpatient therapy.   Client Statement of Needs Leah [name] would like to managing symptoms, processing past events, dealing with interpersonal stressors, and engage in self-care consistently.   Treatment Level Biweekly  Symptoms  Depression: hx of sadness, hx of grief   (Status: maintained) Anxiety: HX of panic, tension, anxiety,   (Status: maintained)  Goals:   Leah [name] experiences symptoms of anxiety and depression.    Target Date: 11/24/22 Frequency: Biweekly  Progress: 0 Modality: individual    Therapist will provide referrals for additional resources as appropriate.  Therapist will provide psycho-education regarding Leah [name]'s diagnosis and corresponding treatment approaches and interventions. Licensed Clinical Social Worker, [place], LCSW will support the patient's ability to achieve the goals identified. will employ CBT, BA, Problem-solving, Solution Focused, Mindfulness,  coping skills, & other evidenced-based practices will be used to promote progress towards healthy functioning to help manage decrease symptoms associated with her diagnosis.   Reduce overall level, frequency, and intensity of the feelings of depression, anxiety and panic evidenced by decreased overlal symptoms from 6 to 7 days/week to 0 to 1 days/week per client report for at least 3 consecutive months. Verbally express understanding of the relationship between feelings of depression, anxiety and their impact on thinking patterns and behaviors. Verbalize an understanding of the role that distorted thinking plays in creating fears, excessive worry, and ruminations.    Leah [name] participated in the creation of the treatment plan)  [name], LCSW

## 2022-02-16 ENCOUNTER — Encounter: Payer: Self-pay | Admitting: Internal Medicine

## 2022-02-18 ENCOUNTER — Encounter: Payer: Self-pay | Admitting: Oncology

## 2022-02-21 ENCOUNTER — Other Ambulatory Visit: Payer: Self-pay

## 2022-02-21 DIAGNOSIS — K219 Gastro-esophageal reflux disease without esophagitis: Secondary | ICD-10-CM

## 2022-02-21 MED ORDER — OMEPRAZOLE 40 MG PO CPDR: 40.0000 mg | DELAYED_RELEASE_CAPSULE | Freq: Every evening | ORAL | 3 refills | Status: DC

## 2022-02-21 NOTE — Telephone Encounter (Signed)
An E-fax for Omeprazole refill request was received today.   Pts last OV: 11/09/21   TOC w/[facility]: 06-13-22  Refill sent to [facility].

## 2022-02-23 ENCOUNTER — Inpatient Hospital Stay: Payer: Medicaid Other | Attending: Oncology | Admitting: Occupational Therapy

## 2022-02-23 DIAGNOSIS — Z170 Estrogen receptor positive status [ER+]: Secondary | ICD-10-CM | POA: Insufficient documentation

## 2022-02-23 DIAGNOSIS — Z79624 Long term (current) use of inhibitors of nucleotide synthesis: Secondary | ICD-10-CM | POA: Insufficient documentation

## 2022-02-23 DIAGNOSIS — Z79899 Other long term (current) drug therapy: Secondary | ICD-10-CM | POA: Insufficient documentation

## 2022-02-23 DIAGNOSIS — C50212 Malignant neoplasm of upper-inner quadrant of left female breast: Secondary | ICD-10-CM | POA: Insufficient documentation

## 2022-02-23 DIAGNOSIS — G620 Drug-induced polyneuropathy: Secondary | ICD-10-CM | POA: Insufficient documentation

## 2022-02-23 DIAGNOSIS — Z79811 Long term (current) use of aromatase inhibitors: Secondary | ICD-10-CM | POA: Insufficient documentation

## 2022-02-23 DIAGNOSIS — T451X5A Adverse effect of antineoplastic and immunosuppressive drugs, initial encounter: Secondary | ICD-10-CM | POA: Insufficient documentation

## 2022-02-23 DIAGNOSIS — Z87891 Personal history of nicotine dependence: Secondary | ICD-10-CM | POA: Insufficient documentation

## 2022-02-23 DIAGNOSIS — M7989 Other specified soft tissue disorders: Secondary | ICD-10-CM

## 2022-02-23 NOTE — Therapy (Signed)
[facility] at [facility], [place], [place], [address] Phone: [phone]   Fax:  [phone]  Occupational Therapy Screen   Patient Details  Name: Leah [name] MRN: [id number] Date of Birth: 02/15/1966 No data recorded  Encounter Date: 02/23/2022   OT End of Session - 02/23/22 1811     Visit Number 0             Past Medical History:  Diagnosis Date   Anxiety    Asthma    as a child   Breast cancer ([place])    Carpal tunnel syndrome    R hand    Colon polyps    Depression    Eosinophilic esophagitis    noted in [place] with GI path report 09/29/14    Esophageal stricture    s/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 Edwin Cap    Family history of breast cancer    Family history of prostate cancer    Herniated disc, cervical    s/p epidural injection   Herpes    History of kidney stones    Hyperlipidemia    Low back pain    Migraines    Personal history of chemotherapy    Personal history of radiation therapy     Past Surgical History:  Procedure Laterality Date   BREAST BIOPSY Left 12/25/2018   [place] BX, invasive mammary carcinoma    BREAST LUMPECTOMY     BREAST LUMPECTOMY WITH NEEDLE LOCALIZATION AND AXILLARY SENTINEL LYMPH NODE BX Left 01/16/2019   Procedure: LEFT BREAST LUMPECTOMY WITH NEEDLE LOCALIZATION AND SENTINEL LYMPH NODE BIOPSY;  Surgeon: Jovita Kussmaul, MD;  Location: [facility] ORS;  Service: General;  Laterality: Left;   CESAREAN SECTION     COLONOSCOPY WITH PROPOFOL N/A 03/27/2017   Procedure: COLONOSCOPY WITH PROPOFOL;  Surgeon: Jonathon Bellows, MD;  Location: [facility] ENDOSCOPY;  Service: Gastroenterology;  Laterality: N/A;   ESOPHAGOGASTRODUODENOSCOPY     ESOPHAGOGASTRODUODENOSCOPY (EGD) WITH PROPOFOL N/A 03/27/2017   Procedure: ESOPHAGOGASTRODUODENOSCOPY (EGD) WITH PROPOFOL WITH DILATION;  Surgeon: Jonathon Bellows, MD;  Location: [facility] ENDOSCOPY;  Service: Gastroenterology;   Laterality: N/A;   PORTACATH PLACEMENT N/A 02/15/2019   Procedure: INSERTION PORT-A-CATH WITH POSSIBLE ULTRASOUND;  Surgeon: Jovita Kussmaul, MD;  Location: [facility] ORS;  Service: General;  Laterality: N/A;   RE-EXCISION OF BREAST CANCER,SUPERIOR MARGINS Left 02/15/2019   Procedure: RE-EXCISION OF LEFT BREAST CANCER ANTERIOR MARGINS;  Surgeon: Jovita Kussmaul, MD;  Location: [facility] ORS;  Service: General;  Laterality: Left;   THROAT SURGERY  1638   UMBILICAL HERNIA REPAIR  2006    x 2   w mesh per pt    There were no vitals filed for this visit.   Subjective Assessment - 02/23/22 1809     Subjective  I starting having about 1-2 months ago some swelling and pain in my L axilla where my lumpectomy and radiation was -did had blister in that area- my R hand will go numb at times just sitting -the whole hand , sometimes from my elbow down , sometimes my pinkie and ring finger- just randomly    Currently in Pain? Yes    Pain Score 2     Pain Location Chest    Pain Orientation Left    Pain Descriptors / Indicators Tightness;Aching                    OT NOTE FROM 11/2020: Pt present at  OT eval with bilateral CTS and medial epicondyle - R worse than the L and R hand worse than the elbow, pain 2/10 at the elbow, 4/10 at he hand and increase to 7/10 as the day goes- pt with decrease AROM in R thumb and wrist with increase tightness over volar forearm and wrist - as well as hand -Positive Tinel and Phalens in bilateral CT, tenderness and pain in R more than L medial epicondyle. Pt fitted with bilateral wrist splints to wear most all the time - off for HEP - do contrast , soft tissue and ROM - modify how she grasp and lift /carry objects- pt grip and prehension strength WFL but lower range for her age - numbness most of the time in hands- THIS DATE - pt return with no symptoms in elbows or thumb tendinitis - report no CTS on the R , L do have some CMC thumb pain and Positive Phalens- but Tinels negative  bilateral - GRip and prehension increase as well as AROM for wrist and diigts - no stiffness- pt to cont with CT splint on the L and CMC neoprene splint as needed - wear CT splint as needed on the R - cont with modifications, HEP and splints - follow up in month if pt is able to progress and maintain - pt can benefit from skilled OT services       OT SCREEN 02/23/22: Patient arrived this date for OT screen because of increased swelling with pain in the left chest and axilla of the last 1 to 2 months.  Patient report also increase numbness at times just sitting in the left upper extremity.  Sometimes the hand sometimes the forearm.  Sometimes just the pinky and the ring finger. She did not had any changes in her job or hobbies. Cannot related to any activities.  Patient was seen last year for symptoms of carpal tunnel as well as cubital tunnel and medial epicondylitis. This date patient had a positive Phalen's on the left but a negative Tinel. Also negative Tinel and tenderness at the cubital tunnel. Patient did show some tightness and fibrosis with some swelling in the Pect major area on the left-around the scar tissue where she had lumpectomy and per patient blister during radiation. With pressure and palpation on the left pec major patient had some sensory symptoms down the left upper extremity. Done some soft tissue massage with mini massager and manually on left breast and chest /pect area - afterwards  patient reporting less pain and tightness with shoulder flexion and abduction as well as upon assessing shoulder abduction and external rotation again patient did not had any numbness or sensory changes. Patient will see neuro oncologist this Friday.  Patient can follow-up with me next week and will reassess if able to maintain progress with a home program of soft tissue massage.                            Visit Diagnosis: Left arm swelling    Problem List Patient  Active Problem List   Diagnosis Date Noted   ASCUS with positive high risk HPV cervical 08/24/2021   Prediabetes 08/22/2021   Acute pain of right knee 08/20/2021   Cognitive changes 07/03/2020   Annual physical exam 03/19/2020   B12 deficiency 07/19/2019   COVID-19 virus infection 03/13/2019   BMI 29.0-29.9,adult 03/03/2019   Genetic testing 01/29/2019   Family history of breast cancer  Family history of prostate cancer    Goals of care, counseling/discussion 01/03/2019   Malignant neoplasm of left female breast ([place]) 12/28/2018   Abnormal MRI of head 12/11/2018   Dizziness 12/11/2018   Chronic pain 12/11/2018   Vitamin D deficiency 12/11/2018   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 [place], [place], [address] Phone: [phone]   Fax:  [phone]  Name: Leah [name] MRN: [id number] Date of Birth: 1965/12/21

## 2022-02-25 ENCOUNTER — Encounter: Payer: Self-pay | Admitting: Internal Medicine

## 2022-02-25 ENCOUNTER — Inpatient Hospital Stay (HOSPITAL_BASED_OUTPATIENT_CLINIC_OR_DEPARTMENT_OTHER): Payer: Medicaid Other | Admitting: Internal Medicine

## 2022-02-25 VITALS — BP 135/72 | HR 90 | Temp 98.0°F | Resp 20 | Ht 63.0 in | Wt 186.2 lb

## 2022-02-25 DIAGNOSIS — Z79811 Long term (current) use of aromatase inhibitors: Secondary | ICD-10-CM | POA: Diagnosis not present

## 2022-02-25 DIAGNOSIS — Z79624 Long term (current) use of inhibitors of nucleotide synthesis: Secondary | ICD-10-CM | POA: Diagnosis not present

## 2022-02-25 DIAGNOSIS — C50212 Malignant neoplasm of upper-inner quadrant of left female breast: Secondary | ICD-10-CM | POA: Diagnosis not present

## 2022-02-25 DIAGNOSIS — Z87891 Personal history of nicotine dependence: Secondary | ICD-10-CM | POA: Diagnosis not present

## 2022-02-25 DIAGNOSIS — G620 Drug-induced polyneuropathy: Secondary | ICD-10-CM | POA: Diagnosis not present

## 2022-02-25 DIAGNOSIS — Z79899 Other long term (current) drug therapy: Secondary | ICD-10-CM | POA: Diagnosis not present

## 2022-02-25 DIAGNOSIS — T451X5A Adverse effect of antineoplastic and immunosuppressive drugs, initial encounter: Secondary | ICD-10-CM

## 2022-02-25 DIAGNOSIS — Z170 Estrogen receptor positive status [ER+]: Secondary | ICD-10-CM | POA: Diagnosis not present

## 2022-02-25 MED ORDER — PREDNISONE 50 MG PO TABS: 50.0000 mg | ORAL_TABLET | Freq: Every day | ORAL | 0 refills | Status: DC

## 2022-02-25 NOTE — Progress Notes (Signed)
[facility] [place], [place] [address] [phone]   Interval Evaluation  Date of Service: 02/25/22 Patient Name: Leah [name] Patient MRN: [id number] Patient DOB: 09-Apr-1965 Provider: Ventura Sellers, MD  Identifying Statement:  Leah [name] is a 56 y.o. 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  Primary Cancer:  Oncologic History: Oncology History  Malignant neoplasm of left female breast ([place])  12/28/2018 Initial Diagnosis   Breast cancer ([place])   01/03/2019 Cancer Staging   Staging form: Breast, AJCC 8th Edition - Clinical stage from 01/03/2019: Stage IA (cT1c, cN0, cM0, G2, ER+, PR+, HER2-) - Signed by Sindy Guadeloupe, MD on 01/03/2019   01/24/2019 Cancer Staging   Staging form: Breast, AJCC 8th Edition - Pathologic stage from 01/24/2019: Stage IA (pT1b, pN1a, cM0, G1, ER+, PR+, HER2-) - Signed by Sindy Guadeloupe, MD on 01/25/2019    Genetic Testing   No pathogenic variants identified. VUS in BARD1 called c.1672T>C (p.Ser558Pro) identified on the [facility] Common Hereditary Cancers Panel. The report date is 01/29/2019.  The Common Hereditary Cancers Panel offered by [facility] includes sequencing and/or deletion duplication testing of the following 47 genes: APC, ATM, AXIN2, BARD1, BMPR1A, BRCA1, BRCA2, BRIP1, CDH1, CDKN2A (p14ARF), CDKN2A (p16INK4a), CKD4, CHEK2, CTNNA1, DICER1, EPCAM (Deletion/duplication testing only), GREM1 (promoter region deletion/duplication testing only), KIT, MEN1, MLH1, MSH2, MSH3, MSH6, MUTYH, NBN, NF1, NHTL1, PALB2, PDGFRA, PMS2, POLD1, POLE, PTEN, RAD50, RAD51C, RAD51C, SDHB, SDHC, SDHD, SMAD4, SMARCA4. STK11, TP53, TSC1, TSC2, and VHL.  The following genes were evaluated for sequence changes only: SDHA and HOXB13 c.251G>A variant only.   03/19/2019 - 04/30/2019 Chemotherapy   The patient had dexamethasone (DECADRON) 4 MG tablet, 1 of 1 cycle, Start date:  06/18/2019, End date: 11/25/2019 DOXOrubicin (ADRIAMYCIN) chemo injection 112 mg, 60 mg/m2 = 112 mg, Intravenous,  Once, 4 of 4 cycles Administration: 112 mg (03/19/2019), 112 mg (04/02/2019), 112 mg (04/16/2019), 112 mg (04/30/2019) palonosetron (ALOXI) injection 0.25 mg, 0.25 mg, Intravenous,  Once, 4 of 4 cycles Administration: 0.25 mg (03/19/2019), 0.25 mg (04/02/2019), 0.25 mg (04/16/2019), 0.25 mg (04/30/2019) pegfilgrastim (NEULASTA ONPRO KIT) injection 6 mg, 6 mg, Subcutaneous, Once, 4 of 4 cycles Administration: 6 mg (03/19/2019), 6 mg (04/02/2019), 6 mg (04/16/2019), 6 mg (04/30/2019) cyclophosphamide (CYTOXAN) 1,120 mg in sodium chloride 0.9 % 250 mL chemo infusion, 600 mg/m2 = 1,120 mg, Intravenous,  Once, 4 of 4 cycles Administration: 1,120 mg (03/19/2019), 1,120 mg (04/02/2019), 1,120 mg (04/16/2019), 1,120 mg (04/30/2019) fosaprepitant (EMEND) 150 mg in sodium chloride 0.9 % 145 mL IVPB, 150 mg, Intravenous,  Once, 4 of 4 cycles Administration: 150 mg (03/19/2019), 150 mg (04/02/2019), 150 mg (04/16/2019), 150 mg (04/30/2019)  for chemotherapy treatment.    05/14/2019 - 08/06/2019 Chemotherapy   Patient is on Treatment Plan : PACLitaxel q7d       Interval History: Leah [name] presents today   H+P (07/03/20) Patient presents today to discuss cognitive changes since beginning treatment for breast cancer.  She describes modest impairment in attention, processing speed and short term memory.  There is increased anxiety and mood lability as well. 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  Otherwise denies focal complaints, no seizures, headaches.  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  Medications: Current Outpatient Medications on File Prior  to Visit  Medication Sig Dispense Refill   acetaminophen (TYLENOL) 500 MG tablet Take  500-1,000 mg by mouth every 6 (six) hours as needed (for pain.).     albuterol (VENTOLIN HFA) 108 (90 Base) MCG/ACT inhaler Inhale 2 puffs into the lungs every 6 (six) hours as needed for wheezing or shortness of breath. 18 g 11   anastrozole (ARIMIDEX) 1 MG tablet Take 1 tablet (1 mg total) by mouth daily. 90 tablet 2   atorvastatin (LIPITOR) 40 MG tablet Take 1 tablet (40 mg total) by mouth daily. 90 tablet 3   benzonatate (TESSALON) 200 MG capsule Take 200 mg by mouth 3 (three) times daily as needed.     cetirizine (ZYRTEC) 10 MG tablet Take 1 tablet (10 mg total) by mouth daily as needed for allergies. 90 tablet 3   Cholecalciferol (VITAMIN D-3) 125 MCG (5000 UT) TABS Take 5,000 Units by mouth daily.     Cyanocobalamin (VITAMIN B-12 PO) Take 1 tablet by mouth daily. 1022m daily     DULoxetine (CYMBALTA) 60 MG capsule TAKE 1 CAPSULE BY MOUTH EVERY DAY 90 capsule 1   gabapentin (NEURONTIN) 300 MG capsule TAKE 1 CAPSULE BY MOUTH THREE TIMES A DAY 90 capsule 2   meclizine (ANTIVERT) 12.5 MG tablet TAKE 1 TABLET BY MOUTH 3 TIMES DAILY AS NEEDED FOR DIZZINESS. 90 tablet 3   meloxicam (MOBIC) 15 MG tablet Take 1 tablet (15 mg total) by mouth daily as needed for pain. 90 tablet 3   methocarbamol (ROBAXIN) 750 MG tablet Take 1 tablet (750 mg total) by mouth every 6 (six) hours as needed for muscle spasms. 120 tablet 5   Na Sulfate-K Sulfate-Mg Sulf 17.5-3.13-1.6 GM/177ML SOLN At 5:00 pm the evening before the procedure: Pour the contents of one bottle of Suprep into the mixing container provided.  Fill the container with cold water to the fill line and mix. DRINK all the liquid in the container. You must drink two (2) more 16 ounces containers of water over the next 1 hour. On the day of the procedure, 5 hours before procedure: Pour the contents of the second bottle of Suprep into the mixing container provided and follow the same instructions. 354 mL 0   omeprazole (PRILOSEC) 40 MG capsule Take 1 capsule  (40 mg total) by mouth every evening. 30 min before food 90 capsule 3   tiZANidine (ZANAFLEX) 4 MG tablet Take 4 mg by mouth at bedtime.     traMADol (ULTRAM) 50 MG tablet Take 1-2 tablets (50-100 mg total) by mouth every 6 (six) hours as needed. 20 tablet 0   traMADol (ULTRAM) 50 MG tablet Take by mouth.     traZODone (DESYREL) 50 MG tablet Take 1-1.5 tablets (50-75 mg total) by mouth at bedtime as needed for sleep. 140 tablet 11   triamcinolone cream (KENALOG) 0.5 % Apply topically 2 (two) times daily. 60 g 0   valACYclovir (VALTREX) 1000 MG tablet Take 1 tablet (1,000 mg total) by mouth daily. Suppression. BID X 5 days with outbreak as needed 120 tablet 3   fluticasone (FLONASE) 50 MCG/ACT nasal spray Place 2 sprays into both nostrils daily. Max 2 sprays (Patient not taking: Reported on 02/25/2022) 16 g 12   [DISCONTINUED] prochlorperazine (COMPAZINE) 10 MG tablet Take 1 tablet (10 mg total) by mouth every 6 (six) hours as needed (Nausea or vomiting). (Patient not taking: Reported on 05/13/2019) 30 tablet 1   No current facility-administered medications on file prior to visit.  Allergies:  Allergies  Allergen Reactions   Hydrocodone-Acetaminophen Hives    All over her body.   Lactose Intolerance (Gi) Other (See Comments)    Bloating and GI upset   Penicillins Rash    Did it involve swelling of the face/tongue/throat, SOB, or low BP? Unknown Did it involve sudden or severe rash/hives, skin peeling, or any reaction on the inside of your mouth or nose? Yes Did you need to seek medical attention at a hospital or doctor's office? Unknown When did it last happen? Childhood reaction.      If all above answers are "NO", may proceed with cephalosporin use.    Sulfa Antibiotics Rash    Full body rash    Past Medical History:  Past Medical History:  Diagnosis Date   Anxiety    Asthma    as a child   Breast cancer ([place])    Carpal tunnel syndrome    R hand    Colon polyps    Depression     Eosinophilic esophagitis    noted in [place] with GI path report 09/29/14    Esophageal stricture    s/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 Edwin Cap    Family history of breast cancer    Family history of prostate cancer    Herniated disc, cervical    s/p epidural injection   Herpes    History of kidney stones    Hyperlipidemia    Low back pain    Migraines    Personal history of chemotherapy    Personal history of radiation therapy    Past Surgical History:  Past Surgical History:  Procedure Laterality Date   BREAST BIOPSY Left 12/25/2018   [place] BX, invasive mammary carcinoma    BREAST LUMPECTOMY     BREAST LUMPECTOMY WITH NEEDLE LOCALIZATION AND AXILLARY SENTINEL LYMPH NODE BX Left 01/16/2019   Procedure: LEFT BREAST LUMPECTOMY WITH NEEDLE LOCALIZATION AND SENTINEL LYMPH NODE BIOPSY;  Surgeon: Jovita Kussmaul, MD;  Location: [facility] ORS;  Service: General;  Laterality: Left;   CESAREAN SECTION     COLONOSCOPY WITH PROPOFOL N/A 03/27/2017   Procedure: COLONOSCOPY WITH PROPOFOL;  Surgeon: Jonathon Bellows, MD;  Location[facility] ENDOSCOPY;  Service: Gastroenterology;  Laterality: N/A;   ESOPHAGOGASTRODUODENOSCOPY     ESOPHAGOGASTRODUODENOSCOPY (EGD) WITH PROPOFOL N/A 03/27/2017   Procedure: ESOPHAGOGASTRODUODENOSCOPY (EGD) WITH PROPOFOL WITH DILATION;  Surgeon: Jonathon Bellows, MD;  Location: [facility] ENDOSCOPY;  Service: Gastroenterology;  Laterality: N/A;   PORTACATH PLACEMENT N/A 02/15/2019   Procedure: INSERTION PORT-A-CATH WITH POSSIBLE ULTRASOUND;  Surgeon: Jovita Kussmaul, MD;  Location: [facility] ORS;  Service: General;  Laterality: N/A;   RE-EXCISION OF BREAST CANCER,SUPERIOR MARGINS Left 02/15/2019   Procedure: RE-EXCISION OF LEFT BREAST CANCER ANTERIOR MARGINS;  Surgeon: Jovita Kussmaul, MD;  Location: [facility] ORS;  Service: General;  Laterality: Left;   THROAT SURGERY  2025   UMBILICAL HERNIA REPAIR  2006    x 2   w mesh per pt   Social History:  Social History    Socioeconomic History   Marital status: Single    Spouse name: Not on file   Number of children: Not on file   Years of education: Not on file   Highest education level: Not on file  Occupational History   Not on file  Tobacco Use   Smoking status: Former    Packs/day: 1.00    Years: 20.00    Total pack years: 20.00    Types:  Cigarettes    Quit date: 06/02/2006    Years since quitting: 15.7   Smokeless tobacco: Never  Vaping Use   Vaping Use: Never used  Substance and Sexual Activity   Alcohol use: Not Currently   Drug use: No   Sexual activity: Yes    Partners: Male  Other Topics Concern   Not on file  Social History Narrative   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 As of 02/26/17    Divorced.    Former smoker 20+ years quit in 1997 then started again for 5 years and quit 10-12 years ago as of 03/08/17    Social Determinants of Radio broadcast assistant Strain: Not on file  Food Insecurity: Not on file  Transportation Needs: Not on file  Physical Activity: Not on file  Stress: Not on file  Social Connections: Not on file  Intimate Partner Violence: Not on file   Family History:  Family History  Problem Relation Age of Onset   Breast cancer Mother        dx late 75s   Thyroid disease Mother    Colon polyps Mother    Prostate cancer Father        dx 104   Breast cancer Maternal Grandmother        dx late 70s   Osteoporosis Maternal Grandmother    Cancer Maternal Uncle        possibly, unsure type    Review of Systems: Constitutional: Doesn't report fevers, chills or abnormal weight loss Eyes: Doesn't report blurriness of vision Ears, nose, mouth, throat, and face: Doesn't report sore throat Respiratory: Doesn't report cough, dyspnea or wheezes Cardiovascular: Doesn't report palpitation, chest discomfort  Gastrointestinal:  Doesn't report nausea, constipation,  diarrhea GU: Doesn't report incontinence Skin: Doesn't report skin rashes Neurological: Per HPI Musculoskeletal: Doesn't report joint pain Behavioral/Psych: +anxiety  Physical Exam: Vitals:   02/25/22 1046 02/25/22 1056  BP: 135/72   Pulse: 90   Resp: 20   Temp:  98 F (36.7 C)  SpO2: 99%    General: Alert, cooperative, pleasant, in no acute distress Head: Normal EENT: No conjunctival injection or scleral icterus.  Lungs: Resp effort normal Cardiac: Regular rate Abdomen: Non-distended abdomen Skin: No rashes cyanosis or petechiae. Extremities: No clubbing or edema  Neurologic Exam: Mental Status: Awake, alert, attentive to examiner. Oriented to self and environment. Language is fluent with intact comprehension.  Cranial Nerves: Visual acuity is grossly normal. Visual fields are full. Extra-ocular movements intact. No ptosis. Face is symmetric Motor: Tone and bulk are normal. Power is full in both arms and legs.  Sensory: Intact to light touch Gait: Normal.   Labs: I have reviewed the data as listed    Component Value Date/Time   NA 140 11/26/2021 1343   K 3.7 11/26/2021 1343   CL 103 11/26/2021 1343   CO2 31 11/26/2021 1343   GLUCOSE 139 (H) 11/26/2021 1343   BUN 17 11/26/2021 1343   CREATININE 0.80 11/26/2021 1343   CREATININE 0.66 10/03/2017 0837   CALCIUM 8.7 (L) 11/26/2021 1343   PROT 6.5 11/26/2021 1343   ALBUMIN 3.4 (L) 11/26/2021 1343   AST 22 11/26/2021 1343   ALT 17 11/26/2021 1343   ALKPHOS 55 11/26/2021 1343   BILITOT 0.7 11/26/2021 1343   GFRNONAA >60 11/26/2021 1343   GFRNONAA 104 10/03/2017  [place] >60 11/25/2019 1312   GFRAA 120 10/03/2017 0837   Lab Results  Component Value Date   WBC 9.1 11/26/2021   NEUTROABS 6.5 11/26/2021   HGB 14.8 11/26/2021   HCT 42.4 11/26/2021   MCV 93.0 11/26/2021   PLT 309 11/26/2021     Assessment/Plan: Chemotherapy-induced neuropathy (Leah [name])  Leah [name] presents with clinical syndrome  consistent with symmetric, length dependent, small and large fiber peripheral neuropathy.  She also is experiencing exacerbation of sensory and pain symptoms around left wrist and hand.  Etiology is likely multifocal; exposure to taxol chemotherapy and compressive median neuropathy.  She has had carpal tunnel symptomatology previously, preceding cancer diagnosis.    We recommended nocturnal splinting, which was already started recently by OT.  In addition, will recommend short course of oral prednison 26m daily x7 days to address underlying inflammation.   She may also increase gabapentin to 6033mat most affected time of day, if helpful.    We reviewed pathophysiology of chemotherapy induced neuropathy, available treatments, and goals of care.  We appreciate the opportunity to participate in the care of Leah [name] She will let usKoreanow how she is doing with her symptoms to establish follow up.   All questions were answered. The patient knows to call the clinic with any problems, questions or concerns. No barriers to learning were detected.  The total time spent in the encounter was 30 minutes and more than 50% was on counseling and review of test results   ZaVentura SellersMD Medical Director of Neuro-Oncology [facility]2/15/23 10:58 AM

## 2022-02-25 NOTE — Progress Notes (Signed)
Patient here for new on set of numbness and tingeling in both hans and arms.

## 2022-03-02 ENCOUNTER — Ambulatory Visit: Payer: Medicaid Other | Admitting: Occupational Therapy

## 2022-03-16 ENCOUNTER — Other Ambulatory Visit: Payer: Self-pay | Admitting: Oncology

## 2022-03-16 ENCOUNTER — Inpatient Hospital Stay: Payer: Medicaid Other | Attending: Oncology | Admitting: Occupational Therapy

## 2022-03-16 DIAGNOSIS — G620 Drug-induced polyneuropathy: Secondary | ICD-10-CM

## 2022-03-16 DIAGNOSIS — M7989 Other specified soft tissue disorders: Secondary | ICD-10-CM

## 2022-03-16 DIAGNOSIS — T451X5A Adverse effect of antineoplastic and immunosuppressive drugs, initial encounter: Secondary | ICD-10-CM

## 2022-03-16 NOTE — Therapy (Signed)
[facility] at [facility] [place], [place], [place], [address] Phone: [phone]   Fax:  [phone]  Occupational Therapy Screen:  Patient Details  Name: Leah [name] MRN: [id number] Date of Birth: May 15, 1965 No data recorded  Encounter Date: 03/16/2022   OT End of Session - 03/16/22 0855     Visit Number 0             Past Medical History:  Diagnosis Date   Anxiety    Asthma    as a child   Breast cancer ([place])    Carpal tunnel syndrome    R hand    Colon polyps    Depression    Eosinophilic esophagitis    noted in [place] with GI path report 09/29/14    Esophageal stricture    s/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 Edwin Cap    Family history of breast cancer    Family history of prostate cancer    Herniated disc, cervical    s/p epidural injection   Herpes    History of kidney stones    Hyperlipidemia    Low back pain    Migraines    Personal history of chemotherapy    Personal history of radiation therapy     Past Surgical History:  Procedure Laterality Date   BREAST BIOPSY Left 12/25/2018   [place] BX, invasive mammary carcinoma    BREAST LUMPECTOMY     BREAST LUMPECTOMY WITH NEEDLE LOCALIZATION AND AXILLARY SENTINEL LYMPH NODE BX Left 01/16/2019   Procedure: LEFT BREAST LUMPECTOMY WITH NEEDLE LOCALIZATION AND SENTINEL LYMPH NODE BIOPSY;  Surgeon: Jovita Kussmaul, MD;  Location: [facility] ORS;  Service: General;  Laterality: Left;   CESAREAN SECTION     COLONOSCOPY WITH PROPOFOL N/A 03/27/2017   Procedure: COLONOSCOPY WITH PROPOFOL;  Surgeon: Jonathon Bellows, MD;  Location: [facility] ENDOSCOPY;  Service: Gastroenterology;  Laterality: N/A;   ESOPHAGOGASTRODUODENOSCOPY     ESOPHAGOGASTRODUODENOSCOPY (EGD) WITH PROPOFOL N/A 03/27/2017   Procedure: ESOPHAGOGASTRODUODENOSCOPY (EGD) WITH PROPOFOL WITH DILATION;  Surgeon: Jonathon Bellows, MD;  Location: [facility] ENDOSCOPY;  Service: Gastroenterology;   Laterality: N/A;   PORTACATH PLACEMENT N/A 02/15/2019   Procedure: INSERTION PORT-A-CATH WITH POSSIBLE ULTRASOUND;  Surgeon: Jovita Kussmaul, MD;  Location: [facility] ORS;  Service: General;  Laterality: N/A;   RE-EXCISION OF BREAST CANCER,SUPERIOR MARGINS Left 02/15/2019   Procedure: RE-EXCISION OF LEFT BREAST CANCER ANTERIOR MARGINS;  Surgeon: Jovita Kussmaul, MD;  Location: [facility] ORS;  Service: General;  Laterality: Left;   THROAT SURGERY  4174   UMBILICAL HERNIA REPAIR  2006    x 2   w mesh per pt    There were no vitals filed for this visit.   Subjective Assessment - 03/16/22 0854     Subjective  My swelling and pain in L axilla doing better and did not had that numbness since I seen you last - did see neurologist - CT he agreed - to cont wiht splint as needed- depending on what I do the day - I have CTS in L more than R    Currently in Pain? No/denies                OT NOTE FROM 11/2020: Pt present at OT eval with bilateral CTS and medial epicondyle - R worse than the L and R hand worse than the elbow, pain 2/10 at the elbow, 4/10 at he hand and increase to 7/10 as the  day goes- pt with decrease AROM in R thumb and wrist with increase tightness over volar forearm and wrist - as well as hand -Positive Tinel and Phalens in bilateral CT, tenderness and pain in R more than L medial epicondyle. Pt fitted with bilateral wrist splints to wear most all the time - off for HEP - do contrast , soft tissue and ROM - modify how she grasp and lift /carry objects- pt grip and prehension strength WFL but lower range for her age - numbness most of the time in hands- THIS DATE - pt return with no symptoms in elbows or thumb tendinitis - report no CTS on the R , L do have some CMC thumb pain and Positive Phalens- but Tinels negative bilateral - GRip and prehension increase as well as AROM for wrist and diigts - no stiffness- pt to cont with CT splint on the L and CMC neoprene splint as needed - wear CT splint as  needed on the R - cont with modifications, HEP and splints - follow up in month if pt is able to progress and maintain - pt can benefit from skilled OT services            OT SCREEN 02/23/22: Patient arrived this date for OT screen because of increased swelling with pain in the left chest and axilla of the last 1 to 2 months.  Patient report also increase numbness at times just sitting in the left upper extremity.  Sometimes the hand sometimes the forearm.  Sometimes just the pinky and the ring finger. She did not had any changes in her job or hobbies. Cannot related to any activities.  Patient was seen last year for symptoms of carpal tunnel as well as cubital tunnel and medial epicondylitis. This date patient had a positive Phalen's on the left but a negative Tinel. Also negative Tinel and tenderness at the cubital tunnel. Patient did show some tightness and fibrosis with some swelling in the Pect major area on the left-around the scar tissue where she had lumpectomy and per patient blister during radiation. With pressure and palpation on the left pec major patient had some sensory symptoms down the left upper extremity. Done some soft tissue massage with mini massager and manually on left breast and chest /pect area - afterwards  patient reporting less pain and tightness with shoulder flexion and abduction as well as upon assessing shoulder abduction and external rotation again patient did not had any numbness or sensory changes. Patient will see neuro oncologist this Friday.  Patient can follow-up with me next week and will reassess if able to maintain progress with a home program of soft tissue massage.    OT SCREEN 03/16/22: Pt arrive today reporting symptoms at L chest and axilla better since OT done some soft tissue 3 wks ago. Had swelling in the Pect major area on the left-around the scar tissue where she had lumpectomy and per patient blister during radiation 3 wks ago - did not hear  anything from [facility] about [place]. Told pt to call over there - with the Holidays everything delayed - but was approve on 03/09/22 by [employer].  Pt to wear if feels congestion in L breast or axilla.   Pt did see neuro oncologist t since last time -   chemo induce numbness but also CTS-  Pt this date positive Tinel and Phalen's on there L  - Tenderness over Cubital tunnel too Pt to cont with doing her  Splint night time - contrast , and soft tissue to volar forearm. Can also do gentle forearm flexor stretches.  Cont to use modifications - using larger joints and built up grips. Pt  has knowledge about home program                                   Visit Diagnosis: Chemotherapy-induced neuropathy ([place])  Left arm swelling    Problem List Patient Active Problem List   Diagnosis Date Noted   Chemotherapy-induced neuropathy ([place]) 02/25/2022   ASCUS with positive high risk HPV cervical 08/24/2021   Prediabetes 08/22/2021   Acute pain of right knee 08/20/2021   Cognitive changes 07/03/2020   Annual physical exam 03/19/2020   B12 deficiency 07/19/2019   COVID-19 virus infection 03/13/2019   BMI 29.0-29.9,adult 03/03/2019   Genetic testing 01/29/2019   Family history of breast cancer    Family history of prostate cancer    Goals of care, counseling/discussion 01/03/2019   Malignant neoplasm of left female breast ([place]) 12/28/2018   Abnormal MRI of head 12/11/2018   Dizziness 12/11/2018   Chronic pain 12/11/2018   Vitamin D deficiency 12/11/2018   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 08/21/2018   Insomnia 03/23/2018   Allergic rhinitis 01/03/2018   Anxiety 01/03/2018   Cervical radiculopathy 10/03/2017   Tinea corporis 10/03/2017   Hyperlipidemia 04/04/2017   Breast cyst, left 04/04/2017   Aortic atherosclerosis ([place]) 04/04/2017   Anxiety and depression 03/08/2017   Dysphagia 03/08/2017   Hip pain-right 03/08/2017   Low back pain  03/08/2017   Flank pain 10/18/2016   Asthma 06/01/2016    Rosalyn Gess, OTR/L,CLT 03/16/2022, 8:55 AM  [facility] at [facility] [address] [place], [place], [address] Phone: [phone]   Fax:  [phone]  Name: Rebekka Lobello MRN: [id number] Date of Birth: [birth year]

## 2022-03-18 DIAGNOSIS — R051 Acute cough: Secondary | ICD-10-CM | POA: Diagnosis not present

## 2022-03-18 DIAGNOSIS — J40 Bronchitis, not specified as acute or chronic: Secondary | ICD-10-CM | POA: Diagnosis not present

## 2022-03-22 ENCOUNTER — Ambulatory Visit (INDEPENDENT_AMBULATORY_CARE_PROVIDER_SITE_OTHER): Payer: Medicaid Other | Admitting: Psychology

## 2022-03-22 DIAGNOSIS — F418 Other specified anxiety disorders: Secondary | ICD-10-CM | POA: Diagnosis not present

## 2022-03-22 NOTE — Progress Notes (Signed)
[facility] Counselor/Therapist Progress Note  Patient ID: Leah [name], MRN: [id number]    Date: 03/22/22  Time Spent: 5:05 pm - 5:44  pm : 39 minutes  Treatment Type: Individual Therapy.  Reported Symptoms: Anxiety, grief and loss.   Mental Status Exam: Appearance:  Neat and Well Groomed     Behavior: Appropriate  Motor: Normal  Speech/Language:  Clear and Coherent  Affect: Appropriate  Mood: dysthymic  Thought process: normal  Thought content:   WNL  Sensory/Perceptual disturbances:   WNL  Orientation: oriented to person, place, time/date, and situation  Attention: Good  Concentration: Good  Memory: [place] of knowledge:  Good  Insight:   Good  Judgment:  Good  Impulse Control: Good   Risk Assessment: Danger to Self:  No Self-injurious Behavior: No Danger to Others: No Duty to Warn:no Physical Aggression / Violence:No  Access to Firearms a concern: No  Gang Involvement:No 8  Subjective:   Leah [name] participated from home, via video, and consented to treatment. Therapist participated from home office. We met online due to Leah [name] pandemic. [name] reviewed the events of the past week. Leah [name] noted feeling ill, recently, and noted feelings related waiting for so long. She noted work related stressors due to a coworker's lack of follow through and noted the frustration in relation to her employee's behavior at work. She noted worsening lymphedema and discussed receiving treatment. She noted her work related tasks affecting her pain levels. She noted proactively managing hier work stress. She continues to struggle with self-care including exercising and low appetite. We explored this during the session and worked on identifying boundaries to addressing her self-care consistently. Barriers include scheduling issues, lethargy after full work days, and difficulty engaging in activities after work. We discussed ways to schedule exercise on days off.  Therapist encouraged Leah [name] to identify previous activities she enjoyed and looked forward to. She will work on this list between sessions. Therapist praised Leah [name] for her effort and energy during the session. Therapist provided supportive therapy.    Interventions:  BA  & interpresonal  Diagnosis:  Other specified anxiety disorders  Treatment Plan:  Client Abilities/Strengths [name] forthcoming and motivated for change.   Support System: Friends and family.   Client Treatment Preferences Outpatient therapy.   Client Statement of Needs Leah [name] would like to managing symptoms, processing past events, dealing with interpersonal stressors, and engage in self-care consistently.   Treatment Level Biweekly  Symptoms  Depression: hx of sadness, hx of grief, lack of self-care, lethargy,    (Status: maintained) Anxiety: HX of panic, tension, anxiety,   (Status: maintained)  Goals:   Leah [name] experiences symptoms of anxiety and depression.    Target Date: 11/24/22 Frequency: Biweekly  Progress: 0 Modality: individual    Therapist will provide referrals for additional resources as appropriate.  Therapist will provide psycho-education regarding Leah [name]'s diagnosis and corresponding treatment approaches and interventions. Licensed Clinical Social Worker, [place], LCSW will support the patient's ability to achieve the goals identified. will employ CBT, BA, Problem-solving, Solution Focused, Mindfulness,  coping skills, & other evidenced-based practices will be used to promote progress towards healthy functioning to help manage decrease symptoms associated with her diagnosis.   Reduce overall level, frequency, and intensity of the feelings of depression, anxiety and panic evidenced by decreased overlal symptoms from 6 to 7 days/week to 0 to 1 days/week per client report for at least 3 consecutive months. Verbally express understanding of the relationship between feelings of depression,  anxiety and  their impact on thinking patterns and behaviors. Verbalize an understanding of the role that distorted thinking plays in creating fears, excessive worry, and ruminations.    Leah [name] participated in the creation of the treatment plan)  Leah Irish, LCSW

## 2022-04-04 ENCOUNTER — Ambulatory Visit: Payer: Medicaid Other | Admitting: Anesthesiology

## 2022-04-04 ENCOUNTER — Encounter
Admission: RE | Disposition: A | Discharge status: Home / Self Care | Payer: Self-pay | Source: Home / Self Care | Attending: Gastroenterology

## 2022-04-04 ENCOUNTER — Ambulatory Visit
Admission: RE | Admit: 2022-04-04 | Discharge: 2022-04-04 | Disposition: A | Discharge status: Home / Self Care | Payer: Medicaid Other | Attending: Gastroenterology | Admitting: Gastroenterology

## 2022-04-04 ENCOUNTER — Encounter: Payer: Self-pay | Admitting: Gastroenterology

## 2022-04-04 DIAGNOSIS — Z860100 Personal history of colon polyps, unspecified: Secondary | ICD-10-CM

## 2022-04-04 DIAGNOSIS — K5730 Diverticulosis of large intestine without perforation or abscess without bleeding: Secondary | ICD-10-CM | POA: Insufficient documentation

## 2022-04-04 DIAGNOSIS — J45909 Unspecified asthma, uncomplicated: Secondary | ICD-10-CM | POA: Diagnosis not present

## 2022-04-04 DIAGNOSIS — Z87891 Personal history of nicotine dependence: Secondary | ICD-10-CM | POA: Insufficient documentation

## 2022-04-04 DIAGNOSIS — Z83719 Family history of colon polyps, unspecified: Secondary | ICD-10-CM | POA: Insufficient documentation

## 2022-04-04 DIAGNOSIS — Z1211 Encounter for screening for malignant neoplasm of colon: Secondary | ICD-10-CM | POA: Diagnosis not present

## 2022-04-04 DIAGNOSIS — F418 Other specified anxiety disorders: Secondary | ICD-10-CM | POA: Diagnosis not present

## 2022-04-04 DIAGNOSIS — G43909 Migraine, unspecified, not intractable, without status migrainosus: Secondary | ICD-10-CM | POA: Diagnosis not present

## 2022-04-04 DIAGNOSIS — K5790 Diverticulosis of intestine, part unspecified, without perforation or abscess without bleeding: Secondary | ICD-10-CM | POA: Diagnosis not present

## 2022-04-04 HISTORY — PX: COLONOSCOPY WITH PROPOFOL: SHX5780

## 2022-04-04 SURGERY — COLONOSCOPY WITH PROPOFOL
Anesthesia: General

## 2022-04-04 MED ORDER — PROPOFOL 500 MG/50ML IV EMUL
INTRAVENOUS | Status: DC | PRN
  Administered 2022-04-04: 165 ug/kg/min via INTRAVENOUS

## 2022-04-04 MED ORDER — SODIUM CHLORIDE 0.9 % IV SOLN: INTRAVENOUS | Status: DC

## 2022-04-04 MED ORDER — STERILE WATER FOR IRRIGATION IR SOLN
Status: DC | PRN
  Administered 2022-04-04: 50 mL

## 2022-04-04 NOTE — Transfer of Care (Signed)
Immediate Anesthesia Transfer of Care Note  Patient: Anshika Pethtel  Procedure(s) Performed: COLONOSCOPY WITH PROPOFOL  Patient Location: PACU  Anesthesia Type:General  Level of Consciousness: awake and alert   Airway & Oxygen Therapy: Patient Spontanous Breathing and Patient connected to nasal cannula oxygen  Post-op Assessment: Report given to RN and Post -op Vital signs reviewed and stable  Post vital signs: Reviewed and stable  Last Vitals:  Vitals Value Taken Time  BP 122/50 04/04/22 0847  Temp    Pulse 95 04/04/22 0847  Resp 24 04/04/22 0848  SpO2 93 % 04/04/22 0847  Vitals shown include unvalidated device data.  Last Pain:  Vitals:   04/04/22 0847  TempSrc:   PainSc: 0-No pain         Complications: No notable events documented.

## 2022-04-04 NOTE — Op Note (Signed)
[facility] Patient Name: Leah [name] Procedure Date: 04/04/2022 8:24 AM MRN: [id number] Account #: [id number] Date of Birth: [birth year] Admit Type: Outpatient Age: 57 Room: [facility] ENDO ROOM 1 Gender: Female Note Status: Finalized Instrument Name: Jasper Riling [id number] Procedure:             Colonoscopy Indications:           Surveillance: Personal history of adenomatous polyps                         on last colonoscopy 5 years ago Providers:             Jonathon Bellows MD, MD Referring MD:          Nino Glow Mclean-Scocuzza MD, MD (Referring MD) Medicines:             Monitored Anesthesia Care Complications:         No immediate complications. Procedure:             Pre-Anesthesia Assessment:                        - Prior to the procedure, a History and Physical was                         performed, and patient medications, allergies and                         sensitivities were reviewed. The patient's tolerance                         of previous anesthesia was reviewed.                        - The risks and benefits of the procedure and the                         sedation options and risks were discussed with the                         patient. All questions were answered and informed                         consent was obtained.                        - ASA Grade Assessment: II - A patient with mild                         systemic disease.                        After obtaining informed consent, the colonoscope was                         passed under direct vision. Throughout the procedure,                         the patient's blood pressure, pulse, and oxygen  saturations were monitored continuously. The                         Colonoscope was introduced through the anus and                         advanced to the the cecum, identified by the                         appendiceal orifice. The colonoscopy was performed                          with ease. The patient tolerated the procedure well.                         The quality of the bowel preparation was excellent.                         The ileocecal valve, appendiceal orifice, and rectum                         were photographed. Findings:      The perianal and digital rectal examinations were normal.      The entire examined colon appeared normal on direct and retroflexion       views.      Multiple small-mouthed diverticula were found in the sigmoid colon.      The exam was otherwise without abnormality on direct and retroflexion       views. Impression:            - The entire examined colon is normal on direct and                         retroflexion views.                        - Diverticulosis in the sigmoid colon.                        - The examination was otherwise normal on direct and                         retroflexion views.                        - No specimens collected. Recommendation:        - Discharge patient to home (with escort).                        - Resume previous diet.                        - Continue present medications.                        - Repeat colonoscopy in 5 years for surveillance. Procedure Code(s):     --- Professional ---                        [phone], Colonoscopy, flexible; diagnostic, including  collection of specimen(s) by brushing or washing, when                         performed (separate procedure) Diagnosis Code(s):     --- Professional ---                        Z86.010, Personal history of colonic polyps                        K57.30, Diverticulosis of large intestine without                         perforation or abscess without bleeding CPT copyright 2022 American Medical Association. All rights reserved. The codes documented in this report are preliminary and upon coder review may  be revised to meet current compliance requirements. Jonathon Bellows, MD Jonathon Bellows  MD, MD 04/04/2022 8:46:49 AM This report has been signed electronically. Number of Addenda: 0 Note Initiated On: 04/04/2022 8:24 AM Scope Withdrawal Time: 0 hours 7 minutes 41 seconds  Total Procedure Duration: 0 hours 9 minutes 55 seconds  Estimated Blood Loss:  Estimated blood loss: none.      [facility]

## 2022-04-04 NOTE — Anesthesia Postprocedure Evaluation (Signed)
Anesthesia Post Note  Patient: Leah [name]  Procedure(s) Performed: COLONOSCOPY WITH PROPOFOL  Patient location during evaluation: PACU Anesthesia Type: General Level of consciousness: awake and awake and alert Pain management: satisfactory to patient Vital Signs Assessment: post-procedure vital signs reviewed and stable Respiratory status: nonlabored ventilation and respiratory function stable Cardiovascular status: stable Anesthetic complications: no  No notable events documented.   Last Vitals:  Vitals:   04/04/22 0737  BP: (!) 144/92  Pulse: 89  Resp: 20  Temp: (!) 36.2 C  SpO2: 97%    Last Pain:  Vitals:   04/04/22 0847  TempSrc:   PainSc: 0-No pain                 VAN STAVEREN,Khyron Garno

## 2022-04-04 NOTE — Anesthesia Preprocedure Evaluation (Signed)
Anesthesia Evaluation  Patient identified by MRN, date of birth, ID band Patient awake    Reviewed: Allergy & Precautions, NPO status , Patient's Chart, lab work & pertinent test results  Airway Mallampati: II  TM Distance: >3 FB Neck ROM: full    Dental  (+) Teeth Intact   Pulmonary neg pulmonary ROS, asthma , Patient abstained from smoking., former smoker   Pulmonary exam normal breath sounds clear to auscultation       Cardiovascular Exercise Tolerance: Good negative cardio ROS Normal cardiovascular exam Rhythm:Regular     Neuro/Psych  Headaches  Anxiety Depression    negative neurological ROS  negative psych ROS   GI/Hepatic negative GI ROS, Neg liver ROS,,,  Endo/Other  negative endocrine ROS    Renal/GU negative Renal ROS  negative genitourinary   Musculoskeletal   Abdominal  (+) + obese  Peds negative pediatric ROS (+)  Hematology negative hematology ROS (+)   Anesthesia Other Findings Past Medical History: No date: Anxiety No date: Asthma     Comment:  as a child No date: Breast cancer ([place]) No date: Carpal tunnel syndrome     Comment:  R hand  No date: Colon polyps No date: Depression No date: Eosinophilic esophagitis     Comment:  noted in [place] with GI path report 09/29/14  No date: Esophageal stricture     Comment:  s/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               Edwin Cap  No date: Family history of breast cancer No date: Family history of prostate cancer No date: Herniated disc, cervical     Comment:  s/p epidural injection No date: Herpes No date: History of kidney stones No date: Hyperlipidemia No date: Low back pain No date: Migraines No date: Personal history of chemotherapy No date: Personal history of radiation therapy  Past Surgical History: 12/25/2018: BREAST BIOPSY; Left     Comment:  [place] BX, invasive mammary carcinoma  No date: BREAST  LUMPECTOMY 01/16/2019: BREAST LUMPECTOMY WITH NEEDLE LOCALIZATION AND AXILLARY  SENTINEL LYMPH NODE BX; Left     Comment:  Procedure: LEFT BREAST LUMPECTOMY WITH NEEDLE               LOCALIZATION AND SENTINEL LYMPH NODE BIOPSY;  Surgeon:               Jovita Kussmaul, MD;  Location: [facility] ORS;  Service:               General;  Laterality: Left; No date: CESAREAN SECTION 03/27/2017: COLONOSCOPY WITH PROPOFOL; N/A     Comment:  Procedure: COLONOSCOPY WITH PROPOFOL;  Surgeon: Jonathon Bellows, MD;  Location: [facility] ENDOSCOPY;  Service:               Gastroenterology;  Laterality: N/A; No date: ESOPHAGOGASTRODUODENOSCOPY 03/27/2017: ESOPHAGOGASTRODUODENOSCOPY (EGD) WITH PROPOFOL; N/A     Comment:  Procedure: ESOPHAGOGASTRODUODENOSCOPY (EGD) WITH               PROPOFOL WITH DILATION;  Surgeon: Jonathon Bellows, MD;                Location: [facility] ENDOSCOPY;  Service: Gastroenterology;                Laterality: N/A; 02/15/2019: PORTACATH PLACEMENT; N/A     Comment:  Procedure: INSERTION PORT-A-CATH WITH POSSIBLE  ULTRASOUND;  Surgeon: Jovita Kussmaul, MD;  Location: [facility]              ORS;  Service: General;  Laterality: N/A; 02/15/2019: RE-EXCISION OF BREAST CANCER,SUPERIOR MARGINS; Left     Comment:  Procedure: RE-EXCISION OF LEFT BREAST CANCER ANTERIOR               MARGINS;  Surgeon: Jovita Kussmaul, MD;  Location: [facility]               ORS;  Service: General;  Laterality: Left; 2015: THROAT SURGERY 0350: UMBILICAL HERNIA REPAIR     Comment:   x 2   w mesh per pt     Reproductive/Obstetrics negative OB ROS                             Anesthesia Physical Anesthesia Plan  ASA: 2  Anesthesia Plan: General   Post-op Pain Management:    Induction: Intravenous  PONV Risk Score and Plan: Propofol infusion and TIVA  Airway Management Planned: Natural Airway  Additional Equipment:   Intra-op Plan:   Post-operative Plan:   Informed Consent: I have  reviewed the patients History and Physical, chart, labs and discussed the procedure including the risks, benefits and alternatives for the proposed anesthesia with the patient or authorized representative who has indicated his/her understanding and acceptance.     Dental Advisory Given  Plan Discussed with: CRNA and Surgeon  Anesthesia Plan Comments:        Anesthesia Quick Evaluation

## 2022-04-04 NOTE — H&P (Signed)
Jonathon Bellows, MD [address], [place], [place], [place], [address] [address], [place], [place], [place], [address] Phone: [phone]  Fax: [phone]  Primary Care Physician:  McLean-Scocuzza, Nino Glow, MD   Pre-Procedure History & Physical: HPI:  Leah [name] is a 57 y.o. female is here for an colonoscopy.   Past Medical History:  Diagnosis Date   Anxiety    Asthma    as a child   Breast cancer ([place])    Carpal tunnel syndrome    R hand    Colon polyps    Depression    Eosinophilic esophagitis    noted in [place] with GI path report 09/29/14    Esophageal stricture    s/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 Edwin Cap    Family history of breast cancer    Family history of prostate cancer    Herniated disc, cervical    s/p epidural injection   Herpes    History of kidney stones    Hyperlipidemia    Low back pain    Migraines    Personal history of chemotherapy    Personal history of radiation therapy     Past Surgical History:  Procedure Laterality Date   BREAST BIOPSY Left 12/25/2018   [place] BX, invasive mammary carcinoma    BREAST LUMPECTOMY     BREAST LUMPECTOMY WITH NEEDLE LOCALIZATION AND AXILLARY SENTINEL LYMPH NODE BX Left 01/16/2019   Procedure: LEFT BREAST LUMPECTOMY WITH NEEDLE LOCALIZATION AND SENTINEL LYMPH NODE BIOPSY;  Surgeon: Jovita Kussmaul, MD;  Location: [facility] ORS;  Service: General;  Laterality: Left;   CESAREAN SECTION     COLONOSCOPY WITH PROPOFOL N/A 03/27/2017   Procedure: COLONOSCOPY WITH PROPOFOL;  Surgeon: Jonathon Bellows, MD;  Location: [facility] ENDOSCOPY;  Service: Gastroenterology;  Laterality: N/A;   ESOPHAGOGASTRODUODENOSCOPY     ESOPHAGOGASTRODUODENOSCOPY (EGD) WITH PROPOFOL N/A 03/27/2017   Procedure: ESOPHAGOGASTRODUODENOSCOPY (EGD) WITH PROPOFOL WITH DILATION;  Surgeon: Jonathon Bellows, MD;  Location: [facility] ENDOSCOPY;  Service: Gastroenterology;  Laterality: N/A;   PORTACATH PLACEMENT N/A 02/15/2019   Procedure:  INSERTION PORT-A-CATH WITH POSSIBLE ULTRASOUND;  Surgeon: Jovita Kussmaul, MD;  Location: [facility] ORS;  Service: General;  Laterality: N/A;   RE-EXCISION OF BREAST CANCER,SUPERIOR MARGINS Left 02/15/2019   Procedure: RE-EXCISION OF LEFT BREAST CANCER ANTERIOR MARGINS;  Surgeon: Jovita Kussmaul, MD;  Location: [facility] ORS;  Service: General;  Laterality: Left;   THROAT SURGERY  7322   UMBILICAL HERNIA REPAIR  2006    x 2   w mesh per pt    Prior to Admission medications   Medication Sig Start Date End Date Taking? Authorizing Provider  albuterol (VENTOLIN HFA) 108 (90 Base) MCG/ACT inhaler Inhale 2 puffs into the lungs every 6 (six) hours as needed for wheezing or shortness of breath. 08/20/21  Yes McLean-Scocuzza, Nino Glow, MD  anastrozole (ARIMIDEX) 1 MG tablet Take 1 tablet (1 mg total) by mouth daily. 02/14/22  Yes Sindy Guadeloupe, MD  atorvastatin (LIPITOR) 40 MG tablet Take 1 tablet (40 mg total) by mouth daily. 08/20/21  Yes McLean-Scocuzza, Nino Glow, MD  DULoxetine (CYMBALTA) 60 MG capsule TAKE 1 CAPSULE BY MOUTH EVERY DAY 01/21/22  Yes Sindy Guadeloupe, MD  gabapentin (NEURONTIN) 300 MG capsule TAKE 1 CAPSULE BY MOUTH THREE TIMES A DAY 03/17/22  Yes Sindy Guadeloupe, MD  meloxicam (MOBIC) 15 MG tablet Take 1 tablet (15 mg total) by mouth daily as needed for pain. 08/20/21  Yes  McLean-Scocuzza, Nino Glow, MD  methocarbamol (ROBAXIN) 750 MG tablet Take 1 tablet (750 mg total) by mouth every 6 (six) hours as needed for muscle spasms. 08/20/21  Yes McLean-Scocuzza, Nino Glow, MD  Na Sulfate-K Sulfate-Mg Sulf 17.5-3.13-1.6 GM/177ML SOLN At 5:00 pm the evening before the procedure: Pour the contents of one bottle of Suprep into the mixing container provided.  Fill the container with cold water to the fill line and mix. DRINK all the liquid in the container. You must drink two (2) more 16 ounces containers of water over the next 1 hour. On the day of the procedure, 5 hours before procedure: Pour the contents of the second bottle  of Suprep into the mixing container provided and follow the same instructions. 01/06/22  Yes Jonathon Bellows, MD  traZODone (DESYREL) 50 MG tablet Take 1-1.5 tablets (50-75 mg total) by mouth at bedtime as needed for sleep. 08/20/21  Yes McLean-Scocuzza, Nino Glow, MD  acetaminophen (TYLENOL) 500 MG tablet Take 500-1,000 mg by mouth every 6 (six) hours as needed (for pain.).    [provider]  benzonatate (TESSALON) 200 MG capsule Take 200 mg by mouth 3 (three) times daily as needed. 10/27/21   [provider]  cetirizine (ZYRTEC) 10 MG tablet Take 1 tablet (10 mg total) by mouth daily as needed for allergies. 05/18/21   McLean-Scocuzza, Nino Glow, MD  Cholecalciferol (VITAMIN D-3) 125 MCG (5000 UT) TABS Take 5,000 Units by mouth daily.    [provider]  Cyanocobalamin (VITAMIN B-12 PO) Take 1 tablet by mouth daily. '1000mg'$  daily    [provider]  fluticasone (FLONASE) 50 MCG/ACT nasal spray Place 2 sprays into both nostrils daily. Max 2 sprays Patient not taking: Reported on 02/25/2022 05/18/21   McLean-Scocuzza, Nino Glow, MD  meclizine (ANTIVERT) 12.5 MG tablet TAKE 1 TABLET BY MOUTH 3 TIMES DAILY AS NEEDED FOR DIZZINESS. 08/20/21   McLean-Scocuzza, Nino Glow, MD  omeprazole (PRILOSEC) 40 MG capsule Take 1 capsule (40 mg total) by mouth every evening. 30 min before food 02/21/22   Dutch Quint B, FNP  predniSONE (DELTASONE) 50 MG tablet Take 1 tablet (50 mg total) by mouth daily with breakfast. Patient not taking: Reported on 04/04/2022 02/25/22   Ventura Sellers, MD  tiZANidine (ZANAFLEX) 4 MG tablet Take 4 mg by mouth at bedtime.    [provider]  traMADol (ULTRAM) 50 MG tablet Take 1-2 tablets (50-100 mg total) by mouth every 6 (six) hours as needed. 02/15/19   Jovita Kussmaul, MD  traMADol Veatrice Bourbon) 50 MG tablet Take by mouth. 09/02/20   [provider]  triamcinolone cream (KENALOG) 0.5 % Apply topically 2 (two) times daily. 11/09/21   McLean-Scocuzza,  Nino Glow, MD  valACYclovir (VALTREX) 1000 MG tablet Take 1 tablet (1,000 mg total) by mouth daily. Suppression. BID X 5 days with outbreak as needed 05/18/21   McLean-Scocuzza, Nino Glow, MD  prochlorperazine (COMPAZINE) 10 MG tablet Take 1 tablet (10 mg total) by mouth every 6 (six) hours as needed (Nausea or vomiting). Patient not taking: Reported on 05/13/2019 02/15/19 05/14/19  Sindy Guadeloupe, MD    Allergies as of 01/06/2022 - Review Complete 11/26/2021  Allergen Reaction Noted   Hydrocodone-acetaminophen Hives 01/24/2019   Lactose intolerance (gi) Other (See Comments) 01/10/2019   Penicillins Rash 06/01/2016   Sulfa antibiotics Rash 06/01/2016    Family History  Problem Relation Age of Onset   Breast cancer Mother        dx  late 39s   Thyroid disease Mother    Colon polyps Mother    Prostate cancer Father        dx 62   Breast cancer Maternal Grandmother        dx late 97s   Osteoporosis Maternal Grandmother    Cancer Maternal Uncle        possibly, unsure type    Social History   Socioeconomic History   Marital status: Single    Spouse name: Not on file   Number of children: Not on file   Years of education: Not on file   Highest education level: Not on file  Occupational History   Not on file  Tobacco Use   Smoking status: Former    Packs/day: 1.00    Years: 20.00    Total pack years: 20.00    Types: Cigarettes    Quit date: 06/02/2006    Years since quitting: 15.8   Smokeless tobacco: Never  Vaping Use   Vaping Use: Never used  Substance and Sexual Activity   Alcohol use: Not Currently   Drug use: No   Sexual activity: Yes    Partners: Male  Other Topics Concern   Not on file  Social History Narrative   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 As of 02/26/17    Divorced.    Former smoker 20+ years quit in 1997 then started again for 5 years and quit 10-12 years ago as  of 03/08/17    Social Determinants of Radio broadcast assistant Strain: Not on file  Food Insecurity: Not on file  Transportation Needs: Not on file  Physical Activity: Not on file  Stress: Not on file  Social Connections: Not on file  Intimate Partner Violence: Not on file    Review of Systems: See HPI, otherwise negative ROS  Physical Exam: BP (!) 144/92   Pulse 89   Temp (!) 97.1 F (36.2 C) (Temporal)   Resp 20   Wt 83.6 kg   SpO2 97%   BMI 32.66 kg/m  General:   Alert,  pleasant and cooperative in NAD Head:  Normocephalic and atraumatic. Neck:  Supple; no masses or thyromegaly. Lungs:  Clear throughout to auscultation, normal respiratory effort.    Heart:  +S1, +S2, Regular rate and rhythm, No edema. Abdomen:  Soft, nontender and nondistended. Normal bowel sounds, without guarding, and without rebound.   Neurologic:  Alert and  oriented x4;  grossly normal neurologically.  Impression/Plan: Shital Crayton is here for an colonoscopy to be performed for surveillance due to prior history of colon polyps   Risks, benefits, limitations, and alternatives regarding  colonoscopy have been reviewed with the patient.  Questions have been answered.  All parties agreeable.   Jonathon Bellows, MD  04/04/2022, 8:20 AM

## 2022-04-05 ENCOUNTER — Other Ambulatory Visit: Payer: Self-pay

## 2022-04-05 ENCOUNTER — Telehealth (INDEPENDENT_AMBULATORY_CARE_PROVIDER_SITE_OTHER): Payer: Medicaid Other | Admitting: Family Medicine

## 2022-04-05 ENCOUNTER — Ambulatory Visit (INDEPENDENT_AMBULATORY_CARE_PROVIDER_SITE_OTHER): Payer: Medicaid Other

## 2022-04-05 ENCOUNTER — Emergency Department: Payer: Medicaid Other

## 2022-04-05 ENCOUNTER — Emergency Department
Admission: EM | Admit: 2022-04-05 | Discharge: 2022-04-05 | Disposition: A | Discharge status: Home / Self Care | Payer: Medicaid Other | Attending: Emergency Medicine | Admitting: Emergency Medicine

## 2022-04-05 ENCOUNTER — Encounter: Payer: Self-pay | Admitting: Gastroenterology

## 2022-04-05 ENCOUNTER — Encounter: Payer: Self-pay | Admitting: *Deleted

## 2022-04-05 VITALS — BP 140/80 | HR 122 | Temp 98.0°F | Resp 19 | Ht 63.0 in | Wt 184.0 lb

## 2022-04-05 DIAGNOSIS — R062 Wheezing: Secondary | ICD-10-CM | POA: Diagnosis not present

## 2022-04-05 DIAGNOSIS — K219 Gastro-esophageal reflux disease without esophagitis: Secondary | ICD-10-CM

## 2022-04-05 DIAGNOSIS — R0609 Other forms of dyspnea: Secondary | ICD-10-CM

## 2022-04-05 DIAGNOSIS — J441 Chronic obstructive pulmonary disease with (acute) exacerbation: Secondary | ICD-10-CM | POA: Insufficient documentation

## 2022-04-05 DIAGNOSIS — R0602 Shortness of breath: Secondary | ICD-10-CM | POA: Diagnosis not present

## 2022-04-05 LAB — CBC
HCT: 45.5 % (ref 36.0–46.0)
Hemoglobin: 15.8 g/dL — ABNORMAL HIGH (ref 12.0–15.0)
MCH: 32 pg (ref 26.0–34.0)
MCHC: 34.7 g/dL (ref 30.0–36.0)
MCV: 92.1 fL (ref 80.0–100.0)
Platelets: 346 10*3/uL (ref 150–400)
RBC: 4.94 MIL/uL (ref 3.87–5.11)
RDW: 11.9 % (ref 11.5–15.5)
WBC: 9.9 10*3/uL (ref 4.0–10.5)
nRBC: 0 % (ref 0.0–0.2)

## 2022-04-05 LAB — BASIC METABOLIC PANEL
Anion gap: 11 (ref 5–15)
BUN: 9 mg/dL (ref 6–20)
CO2: 26 mmol/L (ref 22–32)
Calcium: 9.8 mg/dL (ref 8.9–10.3)
Chloride: 104 mmol/L (ref 98–111)
Creatinine, Ser: 0.71 mg/dL (ref 0.44–1.00)
GFR, Estimated: >60 mL/min (ref >60)
Glucose, Bld: 108 mg/dL — ABNORMAL HIGH (ref 70–99)
Potassium: 3.8 mmol/L (ref 3.5–5.1)
Sodium: 141 mmol/L (ref 135–145)

## 2022-04-05 LAB — TROPONIN I (HIGH SENSITIVITY)
Troponin I (High Sensitivity): 8 ng/L (ref <18)
Troponin I (High Sensitivity): 7 ng/L (ref <18)

## 2022-04-05 LAB — POC COVID19 BINAXNOW: SARS Coronavirus 2 Ag: NEGATIVE

## 2022-04-05 MED ORDER — IPRATROPIUM-ALBUTEROL 0.5-2.5 (3) MG/3ML IN SOLN
6.0000 mL | Freq: Once | RESPIRATORY_TRACT | Status: AC
  Administered 2022-04-05: 6 mL via RESPIRATORY_TRACT
  Filled 2022-04-05: qty 6

## 2022-04-05 MED ORDER — OMEPRAZOLE 40 MG PO CPDR: 40.0000 mg | DELAYED_RELEASE_CAPSULE | Freq: Every evening | ORAL | 3 refills | Status: DC

## 2022-04-05 MED ORDER — MOMETASONE FURO-FORMOTEROL FUM 200-5 MCG/ACT IN AERO
2.0000 | INHALATION_SPRAY | Freq: Once | RESPIRATORY_TRACT | Status: AC
  Administered 2022-04-05: 2 via RESPIRATORY_TRACT
  Filled 2022-04-05: qty 8.8

## 2022-04-05 MED ORDER — METHYLPREDNISOLONE SODIUM SUCC 125 MG IJ SOLR
125.0000 mg | Freq: Once | INTRAMUSCULAR | Status: AC
  Administered 2022-04-05: 125 mg via INTRAVENOUS
  Filled 2022-04-05: qty 2

## 2022-04-05 MED ORDER — FLUTICASONE-SALMETEROL 115-21 MCG/ACT IN AERO: 2.0000 | INHALATION_SPRAY | Freq: Two times a day (BID) | RESPIRATORY_TRACT | 12 refills | Status: DC

## 2022-04-05 NOTE — ED Triage Notes (Signed)
Pt reports sob for 2 weeks   sx worse today.  No chest pain.  Former smoker.  No cough.  No fever.  Pt saw her pmd today and was sent to er for eval of sob.  Pt alert  speech clear.

## 2022-04-05 NOTE — Patient Instructions (Addendum)
It was a pleasure meeting you today. Thank you for allowing me to take part in your health care.  Our goals for today as we discussed include:  Please go to the emergency department for evaluation of worsening asthma exacerbation.  I have called [facility] emergency to let them know that you are on your way.   If you have any questions or concerns, please do not hesitate to call the office at [phone].  I look forward to our next visit and until then take care and stay safe.  Regards,   Carollee Leitz, MD   [facility]

## 2022-04-05 NOTE — Progress Notes (Addendum)
Virtual Visit via Video note  I connected with Leah [name] on 04/20/22 at 1450 by video and verified that I am speaking with the correct person using two identifiers. Leah [name] is currently located at work and coworker, Andee Poles is currently with her during visit. The provider, Carollee Leitz, MD is located in their office at time of visit.  I discussed the limitations, risks, security and privacy concerns of performing an evaluation and management service by video and the availability of in person appointments. I also discussed with the patient that there may be a patient responsible charge related to this service. The patient expressed understanding and agreed to proceed.  Subjective: PCP: Carollee Leitz, MD  No chief complaint on file.   HPI Patient reports symptoms of shortness of breath beginning in January.  She was previously treated with prednisone in August and September 2023 and more recently seen in ED and treated with Levaquin and prednisone for viral bronchitis in January 2024.  She reports symptoms initially improved while on antibiotics and steroids but they seem to come back after discontinuing 2 weeks ago.  Reports shortness of breath on exertion.  Cough with yellow-white phlegm.  Denies any fever, headaches, chest pain, palpitations, nausea/vomiting, or recent sick contacts.  Home COVID test -1 week ago.  Has been using albuterol every 2 hours which helps at the time however shortness of breath resumed shortly.  She reports some swelling around her knees.  Previous smoking history quit 20 years ago.  Recently completed chemotherapy followed by radiation for breast cancer in 2021.  GERD Doing well. Requesting refills for omeprazole.   ROS: Per HPI  Current Outpatient Medications:    acetaminophen (TYLENOL) 500 MG tablet, Take 500-1,000 mg by mouth every 6 (six) hours as needed (for pain.)., Disp: , Rfl:    anastrozole (ARIMIDEX) 1 MG tablet, Take 1 tablet (1  mg total) by mouth daily., Disp: 90 tablet, Rfl: 2   atorvastatin (LIPITOR) 40 MG tablet, Take 1 tablet (40 mg total) by mouth daily., Disp: 90 tablet, Rfl: 3   benzonatate (TESSALON) 200 MG capsule, Take 200 mg by mouth 3 (three) times daily as needed., Disp: , Rfl:    cetirizine (ZYRTEC) 10 MG tablet, Take 1 tablet (10 mg total) by mouth daily as needed for allergies. (Patient not taking: Reported on 04/12/2022), Disp: 90 tablet, Rfl: 3   Cholecalciferol (VITAMIN D-3) 125 MCG (5000 UT) TABS, Take 5,000 Units by mouth daily., Disp: , Rfl:    DULoxetine (CYMBALTA) 60 MG capsule, TAKE 1 CAPSULE BY MOUTH EVERY DAY, Disp: 90 capsule, Rfl: 1   gabapentin (NEURONTIN) 300 MG capsule, TAKE 1 CAPSULE BY MOUTH THREE TIMES A DAY, Disp: 90 capsule, Rfl: 2   meclizine (ANTIVERT) 12.5 MG tablet, TAKE 1 TABLET BY MOUTH 3 TIMES DAILY AS NEEDED FOR DIZZINESS., Disp: 90 tablet, Rfl: 3   meloxicam (MOBIC) 15 MG tablet, Take 1 tablet (15 mg total) by mouth daily as needed for pain., Disp: 90 tablet, Rfl: 3   methocarbamol (ROBAXIN) 750 MG tablet, Take 1 tablet (750 mg total) by mouth every 6 (six) hours as needed for muscle spasms., Disp: 120 tablet, Rfl: 5   tiZANidine (ZANAFLEX) 4 MG tablet, Take 4 mg by mouth at bedtime., Disp: , Rfl:    traZODone (DESYREL) 50 MG tablet, Take 1-1.5 tablets (50-75 mg total) by mouth at bedtime as needed for sleep., Disp: 140 tablet, Rfl: 11   valACYclovir (VALTREX) 1000 MG tablet, Take 1  tablet (1,000 mg total) by mouth daily. Suppression. BID X 5 days with outbreak as needed (Patient taking differently: Take 1,000 mg by mouth as needed. Suppression. BID X 5 days with outbreak as needed), Disp: 120 tablet, Rfl: 3   albuterol (PROVENTIL) (2.5 MG/3ML) 0.083% nebulizer solution, Take 3 mLs (2.5 mg total) by nebulization every 6 (six) hours as needed for wheezing or shortness of breath., Disp: 150 mL, Rfl: 1   albuterol (VENTOLIN HFA) 108 (90 Base) MCG/ACT inhaler, Inhale 2 puffs into the  lungs every 4 (four) hours as needed for wheezing or shortness of breath., Disp: 54 g, Rfl: 11   budesonide-formoterol (SYMBICORT) 160-4.5 MCG/ACT inhaler, Inhale 2 puffs into the lungs in the morning and at bedtime., Disp: 1 each, Rfl: 12   Cyanocobalamin (VITAMIN B-12 PO), Take 1 tablet by mouth daily. '1000mg'$  daily, Disp: , Rfl:    fluticasone (FLONASE) 50 MCG/ACT nasal spray, Place 2 sprays into both nostrils daily. Max 2 sprays (Patient not taking: Reported on 02/25/2022), Disp: 16 g, Rfl: 12   omeprazole (PRILOSEC) 40 MG capsule, Take 1 capsule (40 mg total) by mouth every evening. 30 min before food, Disp: 90 capsule, Rfl: 3  Observations/Objective: Physical Exam Constitutional:      Appearance: She is obese.  Pulmonary:     Effort: Tachypnea and accessory muscle usage present.     Breath sounds: Wheezing present.  Neurological:     Mental Status: She is alert and oriented to person, place, and time. Mental status is at baseline.  Psychiatric:        Mood and Affect: Mood normal.        Behavior: Behavior normal.        Thought Content: Thought content normal.        Judgment: Judgment normal.    Assessment and Plan: Dyspnea on exertion Assessment & Plan: Chronic.  Suspect exacerbation COPD given increased wheezing, shortness of breath on exertion and recently had been feeling well while on antibiotics and steroids.  Cannot rule out viral etiology but less likely given no fevers or recent sick contacts Patient to come in for chest x-ray, RPP today. Will be able to perform lung exam at that time.  Orders: -     DG Chest 2 View; Future -     Respiratory virus panel -     POC COVID-19 BinaxNow -     Brain natriuretic peptide  Gastroesophageal reflux disease without esophagitis Assessment & Plan: Chronic.  Stable.  Doing well on omeprazole Refill PPI  Orders: -     Omeprazole; Take 1 capsule (40 mg total) by mouth every evening. 30 min before food  Dispense: 90 capsule;  Refill: 3  Addendum Patient presented to clinic for chest x-ray.  Upon entering room patient was visibly tripoding and using accessory muscles.  Reported having difficulty breathing and extremely fatigued.  Prolonged expiratory wheezing on exam. Vitals:   04/05/22 1444  BP: (!) 140/80  Pulse: (!) 122  Resp: 19  Temp: 98 F (36.7 C)  SpO2: 94%    Recommend patient go to emergency room for evaluation given and IV Solu-Medrol and possible CAT.  Patient agreeable to plan and able to drive to [facility].  Offered EMT services however patient declined as was a just a 5-minute drive.  Follow Up Instructions: Return if symptoms worsen or fail to improve.   I discussed the assessment and treatment plan with the patient. The patient was provided an opportunity to ask questions  and all were answered. The patient agreed with the plan and demonstrated an understanding of the instructions.   The patient was advised to call back or seek an in-person evaluation if the symptoms worsen or if the condition fails to improve as anticipated.  The above assessment and management plan was discussed with the patient. The patient verbalized understanding of and has agreed to the management plan. Patient is aware to call the clinic if symptoms persist or worsen. Patient is aware when to return to the clinic for a follow-up visit. Patient educated on when it is appropriate to go to the emergency department.      Carollee Leitz, MD

## 2022-04-05 NOTE — ED Provider Notes (Signed)
[facility] Provider Note   Event Date/Time   First MD Initiated Contact with Patient 04/05/22 1905     (approximate) History  Shortness of Breath  HPI Leah [name] is a 57 y.o. female with a remote stated past medical history of tobacco abuse who presents for months of intermittent shortness of breath that has been intermittently improved with steroid treatments and antibiotics.  Patient states that she initially improves while taking her steroid taper however after the steroids are done it takes about a week for her to go back to feeling extremely short of breath and unable to do her activities of daily living without having to stop and catch her breath.  Patient does endorse chest tightness during these times as well as shortness of breath.  Patient endorses worsening of her shortness of breath with exertion. ROS: Patient currently denies any vision changes, tinnitus, difficulty speaking, facial droop, sore throat, abdominal pain, nausea/vomiting/diarrhea, dysuria, or weakness/numbness/paresthesias in any extremity   Physical Exam  Triage Vital Signs: ED Triage Vitals [04/05/22 1657]  Enc Vitals Group     BP (!) 161/103     Pulse Rate (!) 109     Resp (!) 24     Temp 97.8 F (36.6 C)     Temp Source Oral     SpO2 91 %     Weight 184 lb (83.5 kg)     Height '5\' 3"'$  (1.6 m)     Head Circumference      Peak Flow      Pain Score 0     Pain Loc      Pain Edu?      Excl. in [place]?    Most recent vital signs: Vitals:   04/05/22 2027 04/05/22 2031  BP: (!) 156/91   Pulse: 88   Resp: (!) 22   Temp: 98.3 F (36.8 C)   SpO2: 96% 96%   General: Awake, oriented x4. CV:  Good peripheral perfusion.  Resp:  Normal effort.  Expiratory wheezes over bilateral lung fields Abd:  No distention.  Other:  Middle-aged Caucasian female laying in bed in no acute distress ED Results / Procedures / Treatments  Labs (all labs ordered are listed, but only abnormal  results are displayed) Labs Reviewed  BASIC METABOLIC PANEL - Abnormal; Notable for the following components:      Result Value   Glucose, Bld 108 (*)    All other components within normal limits  CBC - Abnormal; Notable for the following components:   Hemoglobin 15.8 (*)    All other components within normal limits  TROPONIN I (HIGH SENSITIVITY)  TROPONIN I (HIGH SENSITIVITY)   EKG ED ECG REPORT I, Naaman Plummer, the attending physician, personally viewed and interpreted this ECG. Date: 04/05/2022 EKG Time: 1701 Rate: 106 Rhythm: Tachycardic sinus rhythm QRS Axis: normal Intervals: normal ST/T Wave abnormalities: normal Narrative Interpretation: Tachycardic sinus rhythm.  No evidence of acute ischemia RADIOLOGY ED MD interpretation: 2 view chest x-ray interpreted by me shows no evidence of acute abnormalities including no pneumonia, pneumothorax, or widened mediastinum -Agree with radiology assessment Official radiology report(s): DG Chest 2 View  Result Date: 04/05/2022 CLINICAL DATA:  Wheezing. EXAM: CHEST - 2 VIEW COMPARISON:  02/15/2019 FINDINGS: The heart size and mediastinal contours are within normal limits. Both lungs are clear. The visualized skeletal structures are unremarkable. Surgical clips again seen in the left breast and left axillary region. IMPRESSION: No active cardiopulmonary disease. Electronically Signed  By: Marlaine Hind M.D.   On: 04/05/2022 16:29   PROCEDURES: Critical Care performed: No .1-3 Lead EKG Interpretation  Performed by: Naaman Plummer, MD Authorized by: Naaman Plummer, MD     Interpretation: normal     ECG rate:  71   ECG rate assessment: normal     Rhythm: sinus rhythm     Ectopy: none     Conduction: normal    MEDICATIONS ORDERED IN ED: Medications  ipratropium-albuterol (DUONEB) 0.5-2.5 (3) MG/3ML nebulizer solution 6 mL (6 mLs Nebulization Given 04/05/22 1944)  methylPREDNISolone sodium succinate (SOLU-MEDROL) 125 mg/2 mL  injection 125 mg (125 mg Intravenous Given 04/05/22 1944)  mometasone-formoterol (DULERA) 200-5 MCG/ACT inhaler 2 puff (2 puffs Inhalation Given 04/05/22 2058)   IMPRESSION / MDM / ASSESSMENT AND PLAN / ED COURSE  I reviewed the triage vital signs and the nursing notes.                             The patient is on the cardiac monitor to evaluate for evidence of arrhythmia and/or significant heart rate changes. Patient's presentation is most consistent with acute presentation with potential threat to life or bodily function. The patient appears to be suffering from a moderate exacerbation of COPD.  Based on the history, exam, CXR/EKG, and further workup I dont suspect any other emergent cause of this presentation, such as pneumonia, acute coronary syndrome, congestive heart failure, pulmonary embolism, or pneumothorax.  ED Interventions: bronchodilators, steroids, antibiotics, reassess  Reassessment: After treatment, the patients shortness of breath is resolved, and their lung exam has returned to baseline. They are comfortable and want to go home.  Rx: Steroids, Antibiotics, Albuterol Disposition: Discharge home with SRP. PCP follow up recommended in next 48hours. Clinical Course as of 04/05/22 2315  Tue Apr 05, 2022  1913 Pulse Rate(!): 109 [EB]    Clinical Course User Index [EB] Naaman Plummer, MD   FINAL CLINICAL IMPRESSION(S) / ED DIAGNOSES   Final diagnoses:  COPD exacerbation ([place])   Rx / DC Orders   ED Discharge Orders          Ordered    fluticasone-salmeterol (ADVAIR HFA) 115-21 MCG/ACT inhaler  2 times daily        04/05/22 2024           Note:  This document was prepared using Dragon voice recognition software and may include unintentional dictation errors.   Naaman Plummer, MD 04/05/22 [phone]

## 2022-04-06 ENCOUNTER — Encounter: Payer: Self-pay | Admitting: Family Medicine

## 2022-04-06 LAB — BRAIN NATRIURETIC PEPTIDE: Brain Natriuretic Peptide: 8 pg/mL (ref <100)

## 2022-04-07 ENCOUNTER — Other Ambulatory Visit: Payer: Self-pay

## 2022-04-07 DIAGNOSIS — R0609 Other forms of dyspnea: Secondary | ICD-10-CM

## 2022-04-07 LAB — RESPIRATORY VIRUS PANEL

## 2022-04-07 MED ORDER — ALBUTEROL SULFATE (2.5 MG/3ML) 0.083% IN NEBU: 2.5000 mg | INHALATION_SOLUTION | Freq: Four times a day (QID) | RESPIRATORY_TRACT | 1 refills | Status: DC | PRN

## 2022-04-07 NOTE — Telephone Encounter (Signed)
Referral placed Neb order placed and printed to your printer over there. Please sign and place upfront for pt to pick up.  I am working from home.

## 2022-04-09 ENCOUNTER — Encounter: Payer: Self-pay | Admitting: Family Medicine

## 2022-04-09 DIAGNOSIS — R0609 Other forms of dyspnea: Secondary | ICD-10-CM | POA: Insufficient documentation

## 2022-04-09 DIAGNOSIS — K219 Gastro-esophageal reflux disease without esophagitis: Secondary | ICD-10-CM

## 2022-04-09 HISTORY — DX: Gastro-esophageal reflux disease without esophagitis: K21.9

## 2022-04-09 NOTE — Assessment & Plan Note (Addendum)
Chronic.  Suspect exacerbation COPD given increased wheezing, shortness of breath on exertion and recently had been feeling well while on antibiotics and steroids.  Cannot rule out viral etiology but less likely given no fevers or recent sick contacts Patient to come in for chest x-ray, RPP today. Will be able to perform lung exam at that time.

## 2022-04-09 NOTE — Assessment & Plan Note (Signed)
Chronic.  Stable.  Doing well on omeprazole Refill PPI

## 2022-04-12 ENCOUNTER — Ambulatory Visit (INDEPENDENT_AMBULATORY_CARE_PROVIDER_SITE_OTHER): Payer: Medicaid Other | Admitting: Student in an Organized Health Care Education/Training Program

## 2022-04-12 ENCOUNTER — Encounter: Payer: Self-pay | Admitting: Student in an Organized Health Care Education/Training Program

## 2022-04-12 VITALS — BP 138/80 | HR 97 | Temp 97.7°F | Ht 63.0 in | Wt 187.2 lb

## 2022-04-12 DIAGNOSIS — J4540 Moderate persistent asthma, uncomplicated: Secondary | ICD-10-CM | POA: Diagnosis not present

## 2022-04-12 MED ORDER — VENTOLIN HFA 108 (90 BASE) MCG/ACT IN AERS: 2.0000 | INHALATION_SPRAY | RESPIRATORY_TRACT | 11 refills | Status: AC | PRN

## 2022-04-12 MED ORDER — BUDESONIDE-FORMOTEROL FUMARATE 160-4.5 MCG/ACT IN AERO: 2.0000 | INHALATION_SPRAY | Freq: Two times a day (BID) | RESPIRATORY_TRACT | 12 refills | Status: DC

## 2022-04-12 NOTE — Progress Notes (Signed)
Synopsis: Referred in for asthma by Carollee Leitz, MD  Assessment & Plan:   1. Moderate persistent asthma without complication  The patient has a history of childhood asthma which appears to be currently active likely secondary to environmental exposures.  She had 5 exacerbations over the course of the summer and has had multiple courses of prednisone. My differential certainly includes uncontrolled asthma. I will also entertain an asthma COPD overlap syndrome on the differential given her history of smoking.  She was not maintained on any long-acting inhalers and was discharged from the ED last on Advair HFA which is not on her insurance's formulary. I will switch her to Symbicort 160-4.52 puffs twice daily as controller medicine.  I will further order pulmonary function testing to evaluate the degree of her obstruction.  I will also order an allergen panel to assess for triggers and Th2 response.  - Pulmonary Function Test [facility] Only; Future - Allergen Panel (27) + IGE - budesonide-formoterol (SYMBICORT) 160-4.5 MCG/ACT inhaler; Inhale 2 puffs into the lungs in the morning and at bedtime.  Dispense: 1 each; Refill: 12 - albuterol (VENTOLIN HFA) 108 (90 Base) MCG/ACT inhaler; Inhale 2 puffs into the lungs every 4 (four) hours as needed for wheezing or shortness of breath.  Dispense: 54 g; Refill: 11  Return in about 3 months (around 07/12/2022).  I spent 60 minutes caring for this patient today, including preparing to see the patient, obtaining a medical history , reviewing a separately obtained history, performing a medically appropriate examination and/or evaluation, counseling and educating the patient/family/caregiver, ordering medications, tests, or procedures, documenting clinical information in the electronic health record, and independently interpreting results (not separately reported/billed) and communicating results to the patient/family/caregiver  Armando Reichert, MD [facility] 04/12/2022 4:38 PM    End of visit medications:  Meds ordered this encounter  Medications   budesonide-formoterol (SYMBICORT) 160-4.5 MCG/ACT inhaler    Sig: Inhale 2 puffs into the lungs in the morning and at bedtime.    Dispense:  1 each    Refill:  12   albuterol (VENTOLIN HFA) 108 (90 Base) MCG/ACT inhaler    Sig: Inhale 2 puffs into the lungs every 4 (four) hours as needed for wheezing or shortness of breath.    Dispense:  54 g    Refill:  11     Current Outpatient Medications:    acetaminophen (TYLENOL) 500 MG tablet, Take 500-1,000 mg by mouth every 6 (six) hours as needed (for pain.)., Disp: , Rfl:    albuterol (PROVENTIL) (2.5 MG/3ML) 0.083% nebulizer solution, Take 3 mLs (2.5 mg total) by nebulization every 6 (six) hours as needed for wheezing or shortness of breath., Disp: 150 mL, Rfl: 1   albuterol (VENTOLIN HFA) 108 (90 Base) MCG/ACT inhaler, Inhale 2 puffs into the lungs every 4 (four) hours as needed for wheezing or shortness of breath., Disp: 54 g, Rfl: 11   anastrozole (ARIMIDEX) 1 MG tablet, Take 1 tablet (1 mg total) by mouth daily., Disp: 90 tablet, Rfl: 2   atorvastatin (LIPITOR) 40 MG tablet, Take 1 tablet (40 mg total) by mouth daily., Disp: 90 tablet, Rfl: 3   benzonatate (TESSALON) 200 MG capsule, Take 200 mg by mouth 3 (three) times daily as needed., Disp: , Rfl:    budesonide-formoterol (SYMBICORT) 160-4.5 MCG/ACT inhaler, Inhale 2 puffs into the lungs in the morning and at bedtime., Disp: 1 each, Rfl: 12   Cholecalciferol (VITAMIN D-3) 125 MCG (5000 UT) TABS, Take  5,000 Units by mouth daily., Disp: , Rfl:    Cyanocobalamin (VITAMIN B-12 PO), Take 1 tablet by mouth daily. '1000mg'$  daily, Disp: , Rfl:    DULoxetine (CYMBALTA) 60 MG capsule, TAKE 1 CAPSULE BY MOUTH EVERY DAY, Disp: 90 capsule, Rfl: 1   gabapentin (NEURONTIN) 300 MG capsule, TAKE 1 CAPSULE BY MOUTH THREE TIMES A DAY, Disp: 90 capsule, Rfl: 2   meclizine (ANTIVERT) 12.5 MG tablet, TAKE  1 TABLET BY MOUTH 3 TIMES DAILY AS NEEDED FOR DIZZINESS., Disp: 90 tablet, Rfl: 3   meloxicam (MOBIC) 15 MG tablet, Take 1 tablet (15 mg total) by mouth daily as needed for pain., Disp: 90 tablet, Rfl: 3   methocarbamol (ROBAXIN) 750 MG tablet, Take 1 tablet (750 mg total) by mouth every 6 (six) hours as needed for muscle spasms., Disp: 120 tablet, Rfl: 5   omeprazole (PRILOSEC) 40 MG capsule, Take 1 capsule (40 mg total) by mouth every evening. 30 min before food, Disp: 90 capsule, Rfl: 3   tiZANidine (ZANAFLEX) 4 MG tablet, Take 4 mg by mouth at bedtime., Disp: , Rfl:    traZODone (DESYREL) 50 MG tablet, Take 1-1.5 tablets (50-75 mg total) by mouth at bedtime as needed for sleep., Disp: 140 tablet, Rfl: 11   valACYclovir (VALTREX) 1000 MG tablet, Take 1 tablet (1,000 mg total) by mouth daily. Suppression. BID X 5 days with outbreak as needed (Patient taking differently: Take 1,000 mg by mouth as needed. Suppression. BID X 5 days with outbreak as needed), Disp: 120 tablet, Rfl: 3   cetirizine (ZYRTEC) 10 MG tablet, Take 1 tablet (10 mg total) by mouth daily as needed for allergies. (Patient not taking: Reported on 04/12/2022), Disp: 90 tablet, Rfl: 3   fluticasone (FLONASE) 50 MCG/ACT nasal spray, Place 2 sprays into both nostrils daily. Max 2 sprays (Patient not taking: Reported on 02/25/2022), Disp: 16 g, Rfl: 12   Subjective:   PATIENT ID: Leah [name] GENDER: female DOB: [birth year], MRN: [id number]  Chief Complaint  Patient presents with   Consult    Had bad COPD exacerbation that started at work, states this was the worst she has ever had.  She said she had bronchitis back in September and wonders if it ever went away.    HPI  The patient is a pleasant 57 year old female with a past medical history of childhood asthma presenting to clinic for evaluation after a recent asthma exacerbation.  Patient was seen in the ED recently secondary to increased shortness of breath, cough, and  wheezing consistent with an asthma exacerbation.  She was treated with nebulizers, steroids, and discharged with an Advair HFA inhaler.  She reports that her symptoms have improved but she is still not back to her baseline, reporting feeling 5 out of 10 in terms of symptom resolution.  She has had multiple episodes similar to this throughout the summer and has had 5 courses of prednisone over the summer to treat asthma exacerbations.  She was not started on a long-acting inhaler for control of her asthma.  No fevers or chills reported, no night sweats, no weight loss, no rash, and no chest pain. Patient has perennial allergies.  Her past medical history is notable for breast cancer status postresection, chemotherapy, and radiation therapy.  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  She reports multiple allergies.  She had asthma growing up and reports outgrowing it at the age of 33. She has a son who has multiple allergies.  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  She is a non-smoker but did smoke in the past, quit in 2008.  She smoked for 20 years with around 30 to 40 pack years of smoking history on her. She denies any illicit drug use and denies any vaping.  Ancillary information including prior medications, full medical/surgical/family/social histories, and PFTs (when available) are listed below and have been reviewed.   Review of Systems  Constitutional:  Negative for chills and fever.  Respiratory:  Positive for cough and shortness of breath. Negative for hemoptysis, sputum production and wheezing.   Cardiovascular:  Negative for chest pain, leg swelling and PND.  Skin:  Negative for rash.     Objective:   Vitals:   04/12/22 1616  BP: 138/80  Pulse: 97  Temp: 97.7 F (36.5 C)  TempSrc: Temporal  SpO2: 94%  Weight: 187 lb 3.2 oz (84.9 kg)  Height: '5\' 3"'$  (1.6 m)   94% on RA  BMI Readings from Last 3 Encounters:  04/12/22 33.16 kg/m   04/05/22 32.59 kg/m  04/05/22 32.59 kg/m   Wt Readings from Last 3 Encounters:  04/12/22 187 lb 3.2 oz (84.9 kg)  04/05/22 184 lb (83.5 kg)  04/05/22 184 lb (83.5 kg)    Physical Exam Constitutional:      General: She is not in acute distress.    Appearance: Normal appearance. She is not ill-appearing.  HENT:     Head: Normocephalic.     Nose: Nose normal.     Mouth/Throat:     Mouth: Mucous membranes are moist.  Cardiovascular:     Rate and Rhythm: Normal rate and regular rhythm.     Pulses: Normal pulses.     Heart sounds: Normal heart sounds.  Pulmonary:     Effort: Pulmonary effort is normal. No respiratory distress.     Breath sounds: Normal breath sounds. No wheezing or rales.  Abdominal:     Palpations: Abdomen is soft.  Musculoskeletal:        General: Normal range of motion.  Skin:    General: Skin is warm.  Neurological:     General: No focal deficit present.     Mental Status: She is alert and oriented to person, place, and time. Mental status is at baseline.     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 Edwin Cap    Family history of breast cancer    Family history of prostate cancer    Gastroesophageal reflux disease without esophagitis 04/09/2022   Herniated disc, cervical    s/p epidural injection   Herpes    History of kidney stones    Hyperlipidemia    Low back pain    Migraines    Personal history of chemotherapy    Personal history of radiation therapy      Family History  Problem Relation Age of Onset   Breast cancer Mother        dx late 8s   Thyroid disease Mother    Colon polyps Mother    Prostate cancer  Father         dx 74   Breast cancer Maternal Grandmother        dx late 53s   Osteoporosis Maternal Grandmother    Cancer Maternal Uncle        possibly, unsure type     Past Surgical History:  Procedure Laterality Date   BREAST BIOPSY Left 12/25/2018   [place] BX, invasive mammary carcinoma    BREAST LUMPECTOMY     BREAST LUMPECTOMY WITH NEEDLE LOCALIZATION AND AXILLARY SENTINEL LYMPH NODE BX Left 01/16/2019   Procedure: LEFT BREAST LUMPECTOMY WITH NEEDLE LOCALIZATION AND SENTINEL LYMPH NODE BIOPSY;  Surgeon: Jovita Kussmaul, MD;  Location: [facility] ORS;  Service: General;  Laterality: Left;   CESAREAN SECTION     COLONOSCOPY WITH PROPOFOL N/A 03/27/2017   Procedure: COLONOSCOPY WITH PROPOFOL;  Surgeon: Jonathon Bellows, MD;  Location: [facility] ENDOSCOPY;  Service: Gastroenterology;  Laterality: N/A;   COLONOSCOPY WITH PROPOFOL N/A 04/04/2022   Procedure: COLONOSCOPY WITH PROPOFOL;  Surgeon: Jonathon Bellows, MD;  Location: [facility] ENDOSCOPY;  Service: Gastroenterology;  Laterality: N/A;   ESOPHAGOGASTRODUODENOSCOPY     ESOPHAGOGASTRODUODENOSCOPY (EGD) WITH PROPOFOL N/A 03/27/2017   Procedure: ESOPHAGOGASTRODUODENOSCOPY (EGD) WITH PROPOFOL WITH DILATION;  Surgeon: Jonathon Bellows, MD;  Location: [facility] ENDOSCOPY;  Service: Gastroenterology;  Laterality: N/A;   PORTACATH PLACEMENT N/A 02/15/2019   Procedure: INSERTION PORT-A-CATH WITH POSSIBLE ULTRASOUND;  Surgeon: Jovita Kussmaul, MD;  Location: [facility] ORS;  Service: General;  Laterality: N/A;   RE-EXCISION OF BREAST CANCER,SUPERIOR MARGINS Left 02/15/2019   Procedure: RE-EXCISION OF LEFT BREAST CANCER ANTERIOR MARGINS;  Surgeon: Jovita Kussmaul, MD;  Location: [facility] ORS;  Service: General;  Laterality: Left;   THROAT SURGERY  6333   UMBILICAL HERNIA REPAIR  2006    x 2   w mesh per pt    Social History   Socioeconomic History   Marital status: Single    Spouse name: Not on file   Number of children: Not on file   Years of education: Not on file   Highest education level: Not on  file  Occupational History   Not on file  Tobacco Use   Smoking status: Former    Packs/day: 2.00    Years: 20.00    Total pack years: 40.00    Types: Cigarettes    Quit date: 06/02/2006    Years since quitting: 15.8   Smokeless tobacco: Never  Vaping Use   Vaping Use: Never used  Substance and Sexual Activity   Alcohol use: Yes   Drug use: No   Sexual activity: Yes    Partners: Male  Other Topics Concern   Not on file  Social History Narrative   Works in Scientist, 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 As of 02/26/17    Divorced.    Former smoker 20+ years quit in 1997 then started again for 5 years and quit 10-12 years ago as of 03/08/17    Social Determinants of Radio broadcast assistant Strain: Not on file  Food Insecurity: Not on file  Transportation Needs: Not on file  Physical Activity: Not on file  Stress: Not on file  Social Connections: Not on file  Intimate Partner Violence: Not on file     Allergies  Allergen Reactions   Hydrocodone-Acetaminophen Hives    All over her body.   Lactose Intolerance (Gi) Other (  See Comments)    Bloating and GI upset   Penicillins Rash    Did it involve swelling of the face/tongue/throat, SOB, or low BP? Unknown Did it involve sudden or severe rash/hives, skin peeling, or any reaction on the inside of your mouth or nose? Yes Did you need to seek medical attention at a hospital or doctor's office? Unknown When did it last happen? Childhood reaction.      If all above answers are "NO", may proceed with cephalosporin use.    Sulfa Antibiotics Rash    Full body rash      CBC    Component Value Date/Time   WBC 9.9 04/05/2022 1700   RBC 4.94 04/05/2022 1700   HGB 15.8 (H) 04/05/2022 1700   HCT 45.5 04/05/2022 1700   PLT 346 04/05/2022 1700   MCV 92.1 04/05/2022 1700   MCH 32.0 04/05/2022 1700   MCHC 34.7 04/05/2022 1700   RDW 11.9 04/05/2022  1700   LYMPHSABS 1.1 11/26/2021 1343   MONOABS 0.7 11/26/2021 1343   EOSABS 0.3 11/26/2021 1343   BASOSABS 0.1 11/26/2021 1343    Pulmonary Functions Testing Results:     No data to display          Outpatient Medications Prior to Visit  Medication Sig Dispense Refill   acetaminophen (TYLENOL) 500 MG tablet Take 500-1,000 mg by mouth every 6 (six) hours as needed (for pain.).     albuterol (PROVENTIL) (2.5 MG/3ML) 0.083% nebulizer solution Take 3 mLs (2.5 mg total) by nebulization every 6 (six) hours as needed for wheezing or shortness of breath. 150 mL 1   anastrozole (ARIMIDEX) 1 MG tablet Take 1 tablet (1 mg total) by mouth daily. 90 tablet 2   atorvastatin (LIPITOR) 40 MG tablet Take 1 tablet (40 mg total) by mouth daily. 90 tablet 3   benzonatate (TESSALON) 200 MG capsule Take 200 mg by mouth 3 (three) times daily as needed.     Cholecalciferol (VITAMIN D-3) 125 MCG (5000 UT) TABS Take 5,000 Units by mouth daily.     Cyanocobalamin (VITAMIN B-12 PO) Take 1 tablet by mouth daily. '1000mg'$  daily     DULoxetine (CYMBALTA) 60 MG capsule TAKE 1 CAPSULE BY MOUTH EVERY DAY 90 capsule 1   gabapentin (NEURONTIN) 300 MG capsule TAKE 1 CAPSULE BY MOUTH THREE TIMES A DAY 90 capsule 2   meclizine (ANTIVERT) 12.5 MG tablet TAKE 1 TABLET BY MOUTH 3 TIMES DAILY AS NEEDED FOR DIZZINESS. 90 tablet 3   meloxicam (MOBIC) 15 MG tablet Take 1 tablet (15 mg total) by mouth daily as needed for pain. 90 tablet 3   methocarbamol (ROBAXIN) 750 MG tablet Take 1 tablet (750 mg total) by mouth every 6 (six) hours as needed for muscle spasms. 120 tablet 5   omeprazole (PRILOSEC) 40 MG capsule Take 1 capsule (40 mg total) by mouth every evening. 30 min before food 90 capsule 3   tiZANidine (ZANAFLEX) 4 MG tablet Take 4 mg by mouth at bedtime.     traZODone (DESYREL) 50 MG tablet Take 1-1.5 tablets (50-75 mg total) by mouth at bedtime as needed for sleep. 140 tablet 11   valACYclovir (VALTREX) 1000 MG tablet Take  1 tablet (1,000 mg total) by mouth daily. Suppression. BID X 5 days with outbreak as needed (Patient taking differently: Take 1,000 mg by mouth as needed. Suppression. BID X 5 days with outbreak as needed) 120 tablet 3   albuterol (VENTOLIN HFA) 108 (90 Base) MCG/ACT inhaler  Inhale 2 puffs into the lungs every 6 (six) hours as needed for wheezing or shortness of breath. 18 g 11   fluticasone-salmeterol (ADVAIR HFA) 115-21 MCG/ACT inhaler Inhale 2 puffs into the lungs 2 (two) times daily. 1 each 12   cetirizine (ZYRTEC) 10 MG tablet Take 1 tablet (10 mg total) by mouth daily as needed for allergies. (Patient not taking: Reported on 04/12/2022) 90 tablet 3   fluticasone (FLONASE) 50 MCG/ACT nasal spray Place 2 sprays into both nostrils daily. Max 2 sprays (Patient not taking: Reported on 02/25/2022) 16 g 12   Na Sulfate-K Sulfate-Mg Sulf 17.5-3.13-1.6 GM/177ML SOLN At 5:00 pm the evening before the procedure: Pour the contents of one bottle of Suprep into the mixing container provided.  Fill the container with cold water to the fill line and mix. DRINK all the liquid in the container. You must drink two (2) more 16 ounces containers of water over the next 1 hour. On the day of the procedure, 5 hours before procedure: Pour the contents of the second bottle of Suprep into the mixing container provided and follow the same instructions. (Patient not taking: Reported on 04/05/2022) 354 mL 0   No facility-administered medications prior to visit.

## 2022-04-12 NOTE — Patient Instructions (Signed)
Today, I ordered blood work. You can get them draw at your preferred [facility] draw station. The nearest one to [facility] is at nearby [place] ([address], [place], [place] [address]).

## 2022-04-19 ENCOUNTER — Ambulatory Visit: Payer: Medicaid Other | Admitting: Psychology

## 2022-04-22 DIAGNOSIS — J449 Chronic obstructive pulmonary disease, unspecified: Secondary | ICD-10-CM | POA: Diagnosis not present

## 2022-05-04 DIAGNOSIS — M4802 Spinal stenosis, cervical region: Secondary | ICD-10-CM | POA: Diagnosis not present

## 2022-05-04 DIAGNOSIS — M5412 Radiculopathy, cervical region: Secondary | ICD-10-CM | POA: Diagnosis not present

## 2022-05-04 DIAGNOSIS — M5030 Other cervical disc degeneration, unspecified cervical region: Secondary | ICD-10-CM | POA: Diagnosis not present

## 2022-05-12 ENCOUNTER — Ambulatory Visit: Payer: Medicaid Other | Attending: Family Medicine

## 2022-05-12 DIAGNOSIS — J4540 Moderate persistent asthma, uncomplicated: Secondary | ICD-10-CM | POA: Insufficient documentation

## 2022-05-12 LAB — PULMONARY FUNCTION TEST ARMC ONLY
DL/VA % pred: 80 %
DL/VA: 3.48 ml/min/mmHg/L
DLCO unc % pred: 83 %
DLCO unc: 16.67 ml/min/mmHg
FEF 25-75 Post: 2.08 L/sec
FEF 25-75 Pre: 1.69 L/sec
FEF2575-%Change-Post: 22 %
FEF2575-%Pred-Post: 81 %
FEF2575-%Pred-Pre: 66 %
FEV1-%Change-Post: 6 %
FEV1-%Pred-Post: 91 %
FEV1-%Pred-Pre: 86 %
FEV1-Post: 2.4 L
FEV1-Pre: 2.27 L
FEV1FVC-%Change-Post: 3 %
FEV1FVC-%Pred-Pre: 91 %
FEV6-%Change-Post: 2 %
FEV6-%Pred-Post: 99 %
FEV6-%Pred-Pre: 96 %
FEV6-Post: 3.2 L
FEV6-Pre: 3.13 L
FEV6FVC-%Change-Post: 0 %
FEV6FVC-%Pred-Post: 102 %
FEV6FVC-%Pred-Pre: 102 %
FVC-%Change-Post: 2 %
FVC-%Pred-Post: 96 %
FVC-%Pred-Pre: 93 %
FVC-Post: 3.21 L
FVC-Pre: 3.13 L
Post FEV1/FVC ratio: 75 %
Post FEV6/FVC ratio: 100 %
Pre FEV1/FVC ratio: 72 %
Pre FEV6/FVC Ratio: 100 %
RV % pred: 109 %
RV: 2 L
TLC % pred: 106 %
TLC: 5.2 L

## 2022-05-16 DIAGNOSIS — M5412 Radiculopathy, cervical region: Secondary | ICD-10-CM | POA: Diagnosis not present

## 2022-05-24 DIAGNOSIS — J4540 Moderate persistent asthma, uncomplicated: Secondary | ICD-10-CM | POA: Diagnosis not present

## 2022-05-27 ENCOUNTER — Other Ambulatory Visit: Payer: Self-pay

## 2022-05-27 ENCOUNTER — Other Ambulatory Visit: Payer: Self-pay | Admitting: Family Medicine

## 2022-05-27 ENCOUNTER — Inpatient Hospital Stay: Payer: Medicaid Other

## 2022-05-27 ENCOUNTER — Inpatient Hospital Stay: Payer: Medicaid Other | Attending: Oncology | Admitting: Medical Oncology

## 2022-05-27 ENCOUNTER — Encounter: Payer: Self-pay | Admitting: Medical Oncology

## 2022-05-27 VITALS — BP 101/52 | HR 60 | Temp 97.1°F | Wt 187.0 lb

## 2022-05-27 DIAGNOSIS — C50912 Malignant neoplasm of unspecified site of left female breast: Secondary | ICD-10-CM

## 2022-05-27 DIAGNOSIS — Z923 Personal history of irradiation: Secondary | ICD-10-CM | POA: Insufficient documentation

## 2022-05-27 DIAGNOSIS — M8580 Other specified disorders of bone density and structure, unspecified site: Secondary | ICD-10-CM | POA: Diagnosis not present

## 2022-05-27 DIAGNOSIS — Z87891 Personal history of nicotine dependence: Secondary | ICD-10-CM | POA: Insufficient documentation

## 2022-05-27 DIAGNOSIS — R9230 Dense breasts, unspecified: Secondary | ICD-10-CM | POA: Diagnosis not present

## 2022-05-27 DIAGNOSIS — Z7983 Long term (current) use of bisphosphonates: Secondary | ICD-10-CM | POA: Diagnosis not present

## 2022-05-27 DIAGNOSIS — Z7951 Long term (current) use of inhaled steroids: Secondary | ICD-10-CM | POA: Diagnosis not present

## 2022-05-27 DIAGNOSIS — Z79899 Other long term (current) drug therapy: Secondary | ICD-10-CM | POA: Diagnosis not present

## 2022-05-27 DIAGNOSIS — Z79811 Long term (current) use of aromatase inhibitors: Secondary | ICD-10-CM

## 2022-05-27 DIAGNOSIS — C50212 Malignant neoplasm of upper-inner quadrant of left female breast: Secondary | ICD-10-CM | POA: Diagnosis not present

## 2022-05-27 DIAGNOSIS — C50919 Malignant neoplasm of unspecified site of unspecified female breast: Secondary | ICD-10-CM

## 2022-05-27 DIAGNOSIS — Z170 Estrogen receptor positive status [ER+]: Secondary | ICD-10-CM | POA: Diagnosis not present

## 2022-05-27 DIAGNOSIS — D751 Secondary polycythemia: Secondary | ICD-10-CM | POA: Insufficient documentation

## 2022-05-27 DIAGNOSIS — R922 Inconclusive mammogram: Secondary | ICD-10-CM | POA: Diagnosis not present

## 2022-05-27 DIAGNOSIS — Z5181 Encounter for therapeutic drug level monitoring: Secondary | ICD-10-CM | POA: Diagnosis not present

## 2022-05-27 DIAGNOSIS — Z79624 Long term (current) use of inhibitors of nucleotide synthesis: Secondary | ICD-10-CM | POA: Diagnosis not present

## 2022-05-27 DIAGNOSIS — Z9221 Personal history of antineoplastic chemotherapy: Secondary | ICD-10-CM | POA: Insufficient documentation

## 2022-05-27 DIAGNOSIS — Z1231 Encounter for screening mammogram for malignant neoplasm of breast: Secondary | ICD-10-CM

## 2022-05-27 LAB — COMPREHENSIVE METABOLIC PANEL
ALT: 21 U/L (ref 0–44)
AST: 21 U/L (ref 15–41)
Albumin: 3.8 g/dL (ref 3.5–5.0)
Alkaline Phosphatase: 41 U/L (ref 38–126)
Anion gap: 8 (ref 5–15)
BUN: 19 mg/dL (ref 6–20)
CO2: 27 mmol/L (ref 22–32)
Calcium: 9.2 mg/dL (ref 8.9–10.3)
Chloride: 100 mmol/L (ref 98–111)
Creatinine, Ser: 0.78 mg/dL (ref 0.44–1.00)
GFR, Estimated: >60 mL/min (ref >60)
Glucose, Bld: 104 mg/dL — ABNORMAL HIGH (ref 70–99)
Potassium: 3.6 mmol/L (ref 3.5–5.1)
Sodium: 135 mmol/L (ref 135–145)
Total Bilirubin: 0.7 mg/dL (ref 0.3–1.2)
Total Protein: 6.7 g/dL (ref 6.5–8.1)

## 2022-05-27 LAB — CBC WITH DIFFERENTIAL/PLATELET
Abs Immature Granulocytes: 0.05 10*3/uL (ref 0.00–0.07)
Basophils Absolute: 0.1 10*3/uL (ref 0.0–0.1)
Basophils Relative: 1 %
Eosinophils Absolute: 0.4 10*3/uL (ref 0.0–0.5)
Eosinophils Relative: 3 %
HCT: 39.2 % (ref 36.0–46.0)
Hemoglobin: 13.6 g/dL (ref 12.0–15.0)
Immature Granulocytes: 1 %
Lymphocytes Relative: 20 %
Lymphs Abs: 2.1 10*3/uL (ref 0.7–4.0)
MCH: 32 pg (ref 26.0–34.0)
MCHC: 34.7 g/dL (ref 30.0–36.0)
MCV: 92.2 fL (ref 80.0–100.0)
Monocytes Absolute: 1.2 10*3/uL — ABNORMAL HIGH (ref 0.1–1.0)
Monocytes Relative: 11 %
Neutro Abs: 7 10*3/uL (ref 1.7–7.7)
Neutrophils Relative %: 64 %
Platelets: 313 10*3/uL (ref 150–400)
RBC: 4.25 MIL/uL (ref 3.87–5.11)
RDW: 11.5 % (ref 11.5–15.5)
WBC: 10.7 10*3/uL — ABNORMAL HIGH (ref 4.0–10.5)
nRBC: 0 % (ref 0.0–0.2)

## 2022-05-27 MED ORDER — SODIUM CHLORIDE 0.9 % IV SOLN
Freq: Once | INTRAVENOUS | Status: AC
  Filled 2022-05-27: qty 250

## 2022-05-27 MED ORDER — ZOLEDRONIC ACID 4 MG/100ML IV SOLN
4.0000 mg | INTRAVENOUS | Status: DC
  Administered 2022-05-27: 4 mg via INTRAVENOUS
  Filled 2022-05-27: qty 100

## 2022-05-27 NOTE — Progress Notes (Signed)
Hematology/Oncology Consult note [facility]  [phone] [phone]  Patient Care Team: Carollee Leitz, MD as PCP - General (Family Medicine) Jovita Kussmaul, MD as Consulting Physician (General Surgery) Noreene Filbert, MD as Referring Physician (Radiation Oncology) Rico Junker, RN as Registered Nurse Sindy Guadeloupe, MD as Consulting Physician (Oncology)   Name of the patient: Leah [name]  [id number]  1965-03-16   Date of visit: 05/27/22  Diagnosis-  pathological prognostic stage Ia invasive mammary carcinoma pT1b pN1 cM0 ER/PR positive HER-2/neu negative s/p lumpectomy, adjuvant chemotherapy and radiation currently on Arimidex  Current Management- Surveillance Zometa every 6 months- last dose was on 11/26/2021 x 3-5 years (started on 11/25/2019)   Chief complaint/ Reason for visit-routine follow-up of breast cancer on Arimidex   Heme/Onc history: Patient is a 57 year old postmenopausal female G2 P2 L2 who recently underwent screening bilateral mammogram which showed area of concern in her left breast.  This was followed by an ultrasound and Core biopsy.  Ultrasound showed 1 x 1.1 x 0.7 cm hypoechoic mass at the 10:30 position of the left breast.  Left axilla appeared normal.  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    Final pathology showed 9 mm grade 1 invasive mammary carcinoma 1 out of 2 lymph nodes involved with macro metastatic carcinoma 3.5 mm.  Anterior margin was focally positive for which patient underwent reexcision surgery with negative margins [id number] (sn)   MammaPrint score came back as high risk with a risk of recurrence of 29% at 10 years without chemotherapy   Patient completed 4 cycles of dose dense AC chemotherapy and 12 cycles of weekly Taxol.  She was started on letrozole in August 2021 but could not tolerate it due to diarrhea and is  currently on Arimidex  Interval history-  Since our last visit she has seen Dr. Mickeal Skinner with neuro oncology for her chemotherapy induced neuropathy and cognitive changes she has been experiencing since her breast cancer treatment. At this visit he recommended nocturnal splinting of her wrist, short course of steroids, continuation of OT and increasing her gabapentin to 600 mg. She states that symptoms are improved and stable. Has a slight worsening of the neuropathy of her left hand after a cortisone injection in her neck but this has improved recently with time and using her nocternal splinting.   In terms of breast cancer surveillance, she reports that she is tolerating her anastrozole well. She denies any breast changes of concerns or side effects from the anastrozole. NO unintentional weight loss or night sweats. Her last mammogram was on 11/24/2021 which was BI-RADS category 2. Per radiology, " Given diagnosis of breast cancer at a young age and dense breast tissue, recommend high risk protocol screening with annual mammography and annual breast MRI with and without contrast/abbreviated breast MRI". Her last bone density scan was on 11/09/2021 which showed a T score of -1.3 which is osteopenic. She is currently taking calcium and vitamin D.     ECOG PS- 0 Pain scale- 0   Review of systems- Review of Systems  Constitutional:  Negative for chills, fever, malaise/fatigue and weight loss.  HENT:  Negative for congestion, ear pain and tinnitus.   Eyes: Negative.  Negative for blurred vision and double vision.  Respiratory: Negative.  Negative for cough, sputum production and shortness of breath.   Cardiovascular: Negative.  Negative for chest pain, palpitations and leg swelling.  Gastrointestinal: Negative.  Negative for abdominal pain, constipation, diarrhea, nausea and vomiting.  Genitourinary:  Negative for dysuria, frequency and urgency.  Musculoskeletal:  Negative for back pain, falls and  joint pain.  Skin: Negative.  Negative for rash.  Neurological:  Negative for dizziness, weakness and headaches.  Endo/Heme/Allergies: Negative.  Does not bruise/bleed easily.  Psychiatric/Behavioral: Negative.  Negative for depression. The patient is not nervous/anxious and does not have insomnia.       Allergies  Allergen Reactions   Hydrocodone-Acetaminophen Hives    All over her body.   Lactose Intolerance (Gi) Other (See Comments)    Bloating and GI upset   Penicillins Rash    Did it involve swelling of the face/tongue/throat, SOB, or low BP? Unknown Did it involve sudden or severe rash/hives, skin peeling, or any reaction on the inside of your mouth or nose? Yes Did you need to seek medical attention at a hospital or doctor's office? Unknown When did it last happen? Childhood reaction.      If all above answers are "NO", may proceed with cephalosporin use.    Sulfa Antibiotics Rash    Full body rash      Past Medical History:  Diagnosis Date   Anxiety    Asthma    as a child   Breast cancer ([place])    Carpal tunnel syndrome    R hand    Colon polyps    Depression    Eosinophilic esophagitis    noted in [place] with GI path report 09/29/14    Esophageal stricture    s/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 Edwin Cap    Family history of breast cancer    Family history of prostate cancer    Gastroesophageal reflux disease without esophagitis 04/09/2022   Herniated disc, cervical    s/p epidural injection   Herpes    History of kidney stones    Hyperlipidemia    Low back pain    Migraines    Personal history of chemotherapy    Personal history of radiation therapy      Past Surgical History:  Procedure Laterality Date   BREAST BIOPSY Left 12/25/2018   [place] BX, invasive mammary carcinoma    BREAST LUMPECTOMY     BREAST LUMPECTOMY WITH NEEDLE LOCALIZATION AND AXILLARY SENTINEL LYMPH NODE BX Left 01/16/2019   Procedure: LEFT BREAST LUMPECTOMY  WITH NEEDLE LOCALIZATION AND SENTINEL LYMPH NODE BIOPSY;  Surgeon: Jovita Kussmaul, MD;  Location: [facility] ORS;  Service: General;  Laterality: Left;   CESAREAN SECTION     COLONOSCOPY WITH PROPOFOL N/A 03/27/2017   Procedure: COLONOSCOPY WITH PROPOFOL;  Surgeon: Jonathon Bellows, MD;  Location: [facility] ENDOSCOPY;  Service: Gastroenterology;  Laterality: N/A;   COLONOSCOPY WITH PROPOFOL N/A 04/04/2022   Procedure: COLONOSCOPY WITH PROPOFOL;  Surgeon: Jonathon Bellows, MD;  Location: [facility] ENDOSCOPY;  Service: Gastroenterology;  Laterality: N/A;   ESOPHAGOGASTRODUODENOSCOPY     ESOPHAGOGASTRODUODENOSCOPY (EGD) WITH PROPOFOL N/A 03/27/2017   Procedure: ESOPHAGOGASTRODUODENOSCOPY (EGD) WITH PROPOFOL WITH DILATION;  Surgeon: Jonathon Bellows, MD;  Location: [facility] ENDOSCOPY;  Service: Gastroenterology;  Laterality: N/A;   PORTACATH PLACEMENT N/A 02/15/2019   Procedure: INSERTION PORT-A-CATH WITH POSSIBLE ULTRASOUND;  Surgeon: Jovita Kussmaul, MD;  Location: [facility] ORS;  Service: General;  Laterality: N/A;   RE-EXCISION OF BREAST CANCER,SUPERIOR MARGINS Left 02/15/2019   Procedure: RE-EXCISION OF LEFT BREAST CANCER ANTERIOR MARGINS;  Surgeon: Jovita Kussmaul, MD;  Location: [facility] ORS;  Service: General;  Laterality: Left;  THROAT SURGERY  123456   UMBILICAL HERNIA REPAIR  2006    x 2   w mesh per pt    Social History   Socioeconomic History   Marital status: Single    Spouse name: Not on file   Number of children: Not on file   Years of education: Not on file   Highest education level: Not on file  Occupational History   Not on file  Tobacco Use   Smoking status: Former    Packs/day: 2.00    Years: 20.00    Additional pack years: 0.00    Total pack years: 40.00    Types: Cigarettes    Quit date: 06/02/2006    Years since quitting: 15.9   Smokeless tobacco: Never  Vaping Use   Vaping Use: Never used  Substance and Sexual Activity   Alcohol use: Yes   Drug use: No   Sexual activity: Yes    Partners: Male  Other  Topics Concern   Not on file  Social History Narrative   Works in Scientist, 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 As of 02/26/17    Divorced.    Former smoker 20+ years quit in 1997 then started again for 5 years and quit 10-12 years ago as of 03/08/17    Social Determinants of Radio broadcast assistant Strain: Not on file  Food Insecurity: Not on file  Transportation Needs: Not on file  Physical Activity: Not on file  Stress: Not on file  Social Connections: Not on file  Intimate Partner Violence: Not on file    Family History  Problem Relation Age of Onset   Breast cancer Mother        dx late 46s   Thyroid disease Mother    Colon polyps Mother    Prostate cancer Father        dx 32   Breast cancer Maternal Grandmother        dx late 39s   Osteoporosis Maternal Grandmother    Cancer Maternal Uncle        possibly, unsure type     Current Outpatient Medications:    acetaminophen (TYLENOL) 500 MG tablet, Take 500-1,000 mg by mouth every 6 (six) hours as needed (for pain.)., Disp: , Rfl:    albuterol (PROVENTIL) (2.5 MG/3ML) 0.083% nebulizer solution, Take 3 mLs (2.5 mg total) by nebulization every 6 (six) hours as needed for wheezing or shortness of breath., Disp: 150 mL, Rfl: 1   albuterol (VENTOLIN HFA) 108 (90 Base) MCG/ACT inhaler, Inhale 2 puffs into the lungs every 4 (four) hours as needed for wheezing or shortness of breath., Disp: 54 g, Rfl: 11   anastrozole (ARIMIDEX) 1 MG tablet, Take 1 tablet (1 mg total) by mouth daily., Disp: 90 tablet, Rfl: 2   atorvastatin (LIPITOR) 40 MG tablet, Take 1 tablet (40 mg total) by mouth daily., Disp: 90 tablet, Rfl: 3   benzonatate (TESSALON) 200 MG capsule, Take 200 mg by mouth 3 (three) times daily as needed., Disp: , Rfl:    budesonide-formoterol (SYMBICORT) 160-4.5 MCG/ACT inhaler, Inhale 2 puffs into the lungs in the morning and at bedtime., Disp: 1  each, Rfl: 12   Cholecalciferol (VITAMIN D-3) 125 MCG (5000 UT) TABS, Take 5,000 Units by mouth daily., Disp: , Rfl:    Cyanocobalamin (VITAMIN B-12 PO), Take 1 tablet by mouth  daily. 1000mg  daily, Disp: , Rfl:    DULoxetine (CYMBALTA) 60 MG capsule, TAKE 1 CAPSULE BY MOUTH EVERY DAY, Disp: 90 capsule, Rfl: 1   gabapentin (NEURONTIN) 300 MG capsule, TAKE 1 CAPSULE BY MOUTH THREE TIMES A DAY, Disp: 90 capsule, Rfl: 2   meclizine (ANTIVERT) 12.5 MG tablet, TAKE 1 TABLET BY MOUTH 3 TIMES DAILY AS NEEDED FOR DIZZINESS., Disp: 90 tablet, Rfl: 3   meloxicam (MOBIC) 15 MG tablet, Take 1 tablet (15 mg total) by mouth daily as needed for pain., Disp: 90 tablet, Rfl: 3   methocarbamol (ROBAXIN) 750 MG tablet, Take 1 tablet (750 mg total) by mouth every 6 (six) hours as needed for muscle spasms., Disp: 120 tablet, Rfl: 5   omeprazole (PRILOSEC) 40 MG capsule, Take 1 capsule (40 mg total) by mouth every evening. 30 min before food, Disp: 90 capsule, Rfl: 3   tiZANidine (ZANAFLEX) 4 MG tablet, Take 4 mg by mouth at bedtime., Disp: , Rfl:    traZODone (DESYREL) 50 MG tablet, Take 1-1.5 tablets (50-75 mg total) by mouth at bedtime as needed for sleep., Disp: 140 tablet, Rfl: 11   valACYclovir (VALTREX) 1000 MG tablet, Take 1 tablet (1,000 mg total) by mouth daily. Suppression. BID X 5 days with outbreak as needed (Patient taking differently: Take 1,000 mg by mouth as needed. Suppression. BID X 5 days with outbreak as needed), Disp: 120 tablet, Rfl: 3   cetirizine (ZYRTEC) 10 MG tablet, Take 1 tablet (10 mg total) by mouth daily as needed for allergies. (Patient not taking: Reported on 04/12/2022), Disp: 90 tablet, Rfl: 3   fluticasone (FLONASE) 50 MCG/ACT nasal spray, Place 2 sprays into both nostrils daily. Max 2 sprays (Patient not taking: Reported on 02/25/2022), Disp: 16 g, Rfl: 12  Physical exam:  Vitals:   05/27/22 1335  BP: (!) 101/52  Pulse: 60  Temp: (!) 97.1 F (36.2 C)  TempSrc: Tympanic  SpO2: 99%   Weight: 187 lb (84.8 kg)   Physical Exam Constitutional:      Appearance: Normal appearance.  HENT:     Head: Normocephalic and atraumatic.  Eyes:     Pupils: Pupils are equal, round, and reactive to light.  Cardiovascular:     Rate and Rhythm: Normal rate and regular rhythm.     Heart sounds: Normal heart sounds. No murmur heard. Pulmonary:     Effort: Pulmonary effort is normal.     Breath sounds: Normal breath sounds. No wheezing.  Abdominal:     General: Bowel sounds are normal. There is no distension.     Palpations: Abdomen is soft.     Tenderness: There is no abdominal tenderness.  Musculoskeletal:        General: Normal range of motion.     Cervical back: Normal range of motion.  Skin:    General: Skin is warm and dry.     Findings: No rash.  Neurological:     Mental Status: She is alert and oriented to person, place, and time.  Psychiatric:        Judgment: Judgment normal.   Breast: Deferred by patient       Latest Ref Rng & Units 05/27/2022    1:19 PM  CMP  Glucose 70 - 99 mg/dL 104   BUN 6 - 20 mg/dL 19   Creatinine 0.44 - 1.00 mg/dL 0.78   Sodium 135 - 145 mmol/L 135   Potassium 3.5 - 5.1 mmol/L 3.6   Chloride 98 - 111  mmol/L 100   CO2 22 - 32 mmol/L 27   Calcium 8.9 - 10.3 mg/dL 9.2   Total Protein 6.5 - 8.1 g/dL 6.7   Total Bilirubin 0.3 - 1.2 mg/dL 0.7   Alkaline Phos 38 - 126 U/L 41   AST 15 - 41 U/L 21   ALT 0 - 44 U/L 21       Latest Ref Rng & Units 05/27/2022    1:19 PM  CBC  WBC 4.0 - 10.5 K/uL 10.7   Hemoglobin 12.0 - 15.0 g/dL 13.6   Hematocrit 36.0 - 46.0 % 39.2   Platelets 150 - 400 K/uL 313      Assessment and plan- Patient is a 57 y.o. female with prior history of polycythemia now with history of invasive Stage Ia left breast cancer ER/PR positive HER2/neu negative. She had a lumpectomy and High risk MammaPrint score.  She is status post 4 cycles of dose dense AC and 12 cycles of Taxol chemotherapy. She also completed XRT in  August 2021.  She was started on Arimidex (Aug 2021) which she is tolerating well.   No signs of recurrence on exam or history today. We reviewed her labs from today as well as her recent Mammogram (Bi-RADS 2)and DEXA scan. Her mammogram was performed on 11/24/2021 and her DEXA was performed on 11/09/2021. Discussed imaging recommendations from radiology and patient is agreeable. DEXA T score went from -1.2 to -1.3 over 2 years. She will need repeat DEXA in 1 year given worsening osteopenia while on zometa with concurrent anastrozole use.   She is currently on prednisone by her orthopedic surgeon for neck pain which explains her mildly elevated WBC.   She will continue follow up with with neuro oncology regarding her now stable neuropathy.   I spent 25 minutes dedicated to the care of this patient (face-to-face and non-face-to-face) on the date of the encounter to include what is described in the assessment and plan.    Visit Diagnosis 1. Encounter for monitoring zoledronic acid therapy   2. Visit for monitoring Arimidex therapy   3. Malignant neoplasm of upper-inner quadrant of left breast in female, estrogen receptor positive ([place])   4. Aromatase inhibitor use   5. Dense breasts   6. Osteopenia, unspecified location     Nelwyn Salisbury PA-C 05/27/2022 2:54 PM

## 2022-05-27 NOTE — Patient Instructions (Signed)

## 2022-05-27 NOTE — Addendum Note (Signed)
Addended by: Carl Best H on: 05/27/2022 03:44 PM   Modules accepted: Orders

## 2022-05-29 LAB — ALLERGEN PANEL (27) + IGE
Alternaria Alternata IgE: <0.1 kU/L
Aspergillus Fumigatus IgE: <0.1 kU/L
Bahia Grass IgE: <0.1 kU/L
Bermuda Grass IgE: <0.1 kU/L
Cat Dander IgE: 9.8 kU/L — AB
Cedar, Mountain IgE: <0.1 kU/L
Cladosporium Herbarum IgE: <0.1 kU/L
Cocklebur IgE: <0.1 kU/L
Cockroach, American IgE: <0.1 kU/L
Common Silver Birch IgE: <0.1 kU/L
D Farinae IgE: <0.1 kU/L
D Pteronyssinus IgE: <0.1 kU/L
Dog Dander IgE: 2.72 kU/L — AB
Elm, American IgE: <0.1 kU/L
Hickory, White IgE: <0.1 kU/L
IgE (Immunoglobulin E), Serum: 138 IU/mL (ref 6–495)
Johnson Grass IgE: <0.1 kU/L
Kentucky Bluegrass IgE: <0.1 kU/L
Maple/Box Elder IgE: <0.1 kU/L
Mucor Racemosus IgE: <0.1 kU/L
Oak, White IgE: <0.1 kU/L
Penicillium Chrysogen IgE: <0.1 kU/L
Pigweed, Rough IgE: <0.1 kU/L
Plantain, English IgE: <0.1 kU/L
Ragweed, Short IgE: 0.2 kU/L — AB
Setomelanomma Rostrat: <0.1 kU/L
Timothy Grass IgE: <0.1 kU/L
White Mulberry IgE: <0.1 kU/L

## 2022-05-30 ENCOUNTER — Other Ambulatory Visit: Payer: Self-pay | Admitting: Medical Oncology

## 2022-05-30 DIAGNOSIS — Z1231 Encounter for screening mammogram for malignant neoplasm of breast: Secondary | ICD-10-CM

## 2022-05-30 DIAGNOSIS — C50919 Malignant neoplasm of unspecified site of unspecified female breast: Secondary | ICD-10-CM

## 2022-06-06 ENCOUNTER — Ambulatory Visit
Admission: RE | Admit: 2022-06-06 | Discharge: 2022-06-06 | Disposition: A | Discharge status: Home / Self Care | Payer: Medicaid Other | Source: Ambulatory Visit | Attending: Medical Oncology | Admitting: Medical Oncology

## 2022-06-06 DIAGNOSIS — N6489 Other specified disorders of breast: Secondary | ICD-10-CM | POA: Diagnosis not present

## 2022-06-06 DIAGNOSIS — C50919 Malignant neoplasm of unspecified site of unspecified female breast: Secondary | ICD-10-CM

## 2022-06-06 MED ORDER — GADOPICLENOL 0.5 MMOL/ML IV SOLN
9.0000 mL | Freq: Once | INTRAVENOUS | Status: AC | PRN
  Administered 2022-06-06: 9 mL via INTRAVENOUS

## 2022-06-07 DIAGNOSIS — M4802 Spinal stenosis, cervical region: Secondary | ICD-10-CM | POA: Diagnosis not present

## 2022-06-07 DIAGNOSIS — M5030 Other cervical disc degeneration, unspecified cervical region: Secondary | ICD-10-CM | POA: Diagnosis not present

## 2022-06-07 DIAGNOSIS — G5622 Lesion of ulnar nerve, left upper limb: Secondary | ICD-10-CM | POA: Diagnosis not present

## 2022-06-07 DIAGNOSIS — R200 Anesthesia of skin: Secondary | ICD-10-CM | POA: Diagnosis not present

## 2022-06-07 DIAGNOSIS — R202 Paresthesia of skin: Secondary | ICD-10-CM | POA: Diagnosis not present

## 2022-06-07 DIAGNOSIS — M5412 Radiculopathy, cervical region: Secondary | ICD-10-CM | POA: Diagnosis not present

## 2022-06-10 ENCOUNTER — Other Ambulatory Visit: Payer: Self-pay | Admitting: *Deleted

## 2022-06-12 NOTE — Progress Notes (Unsigned)
   SUBJECTIVE:  No chief complaint on file.  HPI Patient presents to clinic for transfer care  Asthma Followed by pulmonology.  Currently on Symbicort and albuterol inhaler.  Chronic pain Controlled with current medications.  Currently takes Zanaflex 4 mg nightly, methocarbamol 750 mg every 6 hours as needed gabapentin 300 mg 3 times daily.  Tolerating medication well.  Followed by physiatry.  Prediabetes   Hyperlipidemia   Mood disorder Doing well.  Takes Cymbalta 60 mg daily.  Denies any SI/HI.  Also has sleep issues and has been taking trazodone 50 mg nightly.  Tolerating medications well. Previously seen by psychiatry years ago.    PERTINENT PMH / PSH: Cervical radiculopathy Chemotherapy induced neuropathy Low back pain Mood disorder/sleep disturbance    OBJECTIVE:  There were no vitals taken for this visit.   Physical Exam  ASSESSMENT/PLAN:  There are no diagnoses linked to this encounter. PDMP reviewed***  No follow-ups on file.  Carollee Leitz, MD

## 2022-06-12 NOTE — Patient Instructions (Incomplete)
It was a pleasure meeting you today. Thank you for allowing me to take part in your health care.  Our goals for today as we discussed include:  Follow-up with OB/GYN for recent abnormal Pap.  We will get some labs today.  If they are abnormal or we need to do something about them, I will call you.  If they are normal, I will send you a message on MyChart (if it is active) or a letter in the mail.  If you don't hear from [place] in 2 weeks, please call the office at the number below.   Please schedule appointment for annual physical in the next few months.  If you have any questions or concerns, please do not hesitate to call the office at [phone].  I look forward to our next visit and until then take care and stay safe.  Regards,   Carollee Leitz, MD   [facility]

## 2022-06-13 ENCOUNTER — Ambulatory Visit (INDEPENDENT_AMBULATORY_CARE_PROVIDER_SITE_OTHER): Payer: Medicaid Other | Admitting: Family Medicine

## 2022-06-13 ENCOUNTER — Encounter: Payer: Self-pay | Admitting: Family Medicine

## 2022-06-13 VITALS — BP 140/78 | HR 65 | Temp 98.6°F | Ht 63.0 in | Wt 184.4 lb

## 2022-06-13 DIAGNOSIS — E559 Vitamin D deficiency, unspecified: Secondary | ICD-10-CM | POA: Diagnosis not present

## 2022-06-13 DIAGNOSIS — K219 Gastro-esophageal reflux disease without esophagitis: Secondary | ICD-10-CM

## 2022-06-13 DIAGNOSIS — E782 Mixed hyperlipidemia: Secondary | ICD-10-CM

## 2022-06-13 DIAGNOSIS — R42 Dizziness and giddiness: Secondary | ICD-10-CM | POA: Diagnosis not present

## 2022-06-13 DIAGNOSIS — M5412 Radiculopathy, cervical region: Secondary | ICD-10-CM | POA: Diagnosis not present

## 2022-06-13 DIAGNOSIS — R7303 Prediabetes: Secondary | ICD-10-CM | POA: Diagnosis not present

## 2022-06-13 DIAGNOSIS — G4700 Insomnia, unspecified: Secondary | ICD-10-CM | POA: Diagnosis not present

## 2022-06-13 DIAGNOSIS — G620 Drug-induced polyneuropathy: Secondary | ICD-10-CM

## 2022-06-13 DIAGNOSIS — I700 Atherosclerosis of aorta: Secondary | ICD-10-CM

## 2022-06-13 DIAGNOSIS — J4520 Mild intermittent asthma, uncomplicated: Secondary | ICD-10-CM | POA: Diagnosis not present

## 2022-06-13 DIAGNOSIS — F39 Unspecified mood [affective] disorder: Secondary | ICD-10-CM | POA: Diagnosis not present

## 2022-06-13 DIAGNOSIS — R87610 Atypical squamous cells of undetermined significance on cytologic smear of cervix (ASC-US): Secondary | ICD-10-CM | POA: Diagnosis not present

## 2022-06-13 DIAGNOSIS — R030 Elevated blood-pressure reading, without diagnosis of hypertension: Secondary | ICD-10-CM

## 2022-06-13 DIAGNOSIS — R87810 Cervical high risk human papillomavirus (HPV) DNA test positive: Secondary | ICD-10-CM

## 2022-06-13 DIAGNOSIS — Z23 Encounter for immunization: Secondary | ICD-10-CM

## 2022-06-13 DIAGNOSIS — T451X5A Adverse effect of antineoplastic and immunosuppressive drugs, initial encounter: Secondary | ICD-10-CM

## 2022-06-13 DIAGNOSIS — Z79899 Other long term (current) drug therapy: Secondary | ICD-10-CM | POA: Insufficient documentation

## 2022-06-13 LAB — LIPID PANEL
Cholesterol: 251 mg/dL — ABNORMAL HIGH (ref 0–200)
HDL: 67.7 mg/dL (ref >39.00)
NonHDL: 183.29
Total CHOL/HDL Ratio: 4
Triglycerides: 208 mg/dL — ABNORMAL HIGH (ref 0.0–149.0)
VLDL: 41.6 mg/dL — ABNORMAL HIGH (ref 0.0–40.0)

## 2022-06-13 LAB — VITAMIN D 25 HYDROXY (VIT D DEFICIENCY, FRACTURES): VITD: 34.99 ng/mL (ref 30.00–100.00)

## 2022-06-13 LAB — HEMOGLOBIN A1C: Hgb A1c MFr Bld: 5.9 % (ref 4.6–6.5)

## 2022-06-13 LAB — LDL CHOLESTEROL, DIRECT: Direct LDL: 150 mg/dL

## 2022-06-13 NOTE — Assessment & Plan Note (Addendum)
Blood pressure initially elevated, repeat remains elevated after receiving vaccination.  Not currently on blood pressure medication.  Asymptomatic. Monitor blood pressure at home.  Goal less than 130/80. Will continue to monitor, recheck at next visit if remains elevated plan to discuss initiation of antihypertensives.

## 2022-06-13 NOTE — Assessment & Plan Note (Signed)
Chronic.  Takes meclizine 12.5 mg for flareups.  No frequent flareups.  Has had MRI head in past and negative. Will continue to monitor.  If recurs can consider further workup.  Has not tried vestibular rehab

## 2022-06-13 NOTE — Assessment & Plan Note (Signed)
Chronic.  Has albuterol inhaler and nebulizer along with Symbicort. Follows with pulmonology

## 2022-06-13 NOTE — Assessment & Plan Note (Signed)
Chronic.  On vitamin D supplements. Check vitamin D level

## 2022-06-13 NOTE — Assessment & Plan Note (Signed)
Chronic.  Stable.  Long-term use of PPI.  Previous EGD negative for esophagitis. Recommend switching to H2 and slowly weaning PPI in future Follows with GI

## 2022-06-13 NOTE — Assessment & Plan Note (Signed)
Chronic.  Currently on duloxetine 60 mg daily.  Recently restarted therapy. Recent PHQ-9 16.  Denies any SI/HI. Continue CBT with therapist Follow-up PCP as needed.

## 2022-06-13 NOTE — Assessment & Plan Note (Signed)
Chronic.  Recent cervical injection. Continue Zanaflex 4 mg nightly Continue methocarbamol 750 mg daily Continue gabapentin 300 mg 3 times daily Follows with physiatry

## 2022-06-13 NOTE — Assessment & Plan Note (Signed)
Chronic.  Long-term use of trazodone Continue trazodone 50 mg nightly Follow-up with behavioral health

## 2022-06-13 NOTE — Assessment & Plan Note (Signed)
Pap due 6/24, last Pap 08/20/2021 HPV positive, ASCUS.Colposcopy with Dr. Glennon Mac 09/15/2021 at [facility].   Request results from biopsy from colposcopy Request clarification of next open follow-up.

## 2022-06-13 NOTE — Assessment & Plan Note (Signed)
Chronic. Recheck A1c

## 2022-06-13 NOTE — Assessment & Plan Note (Signed)
Chronic.  On statin therapy and tolerating well.  Denies any myalgias. Continue atorvastatin 40 mg daily Fasting lipids today.

## 2022-06-14 ENCOUNTER — Telehealth: Payer: Self-pay

## 2022-06-14 ENCOUNTER — Other Ambulatory Visit: Payer: Self-pay | Admitting: Oncology

## 2022-06-14 ENCOUNTER — Other Ambulatory Visit: Payer: Self-pay | Admitting: *Deleted

## 2022-06-14 NOTE — Telephone Encounter (Signed)
A letter was created in Epic requesting the colposcopy biopsy pathology report per Dr. Volanda Napoleon.  Letter was faxed within ;epic to [facility].  Paulena Servais,cma

## 2022-06-14 NOTE — Telephone Encounter (Signed)
-----   Message from Carollee Leitz, MD sent at 06/13/2022  8:32 PM EDT ----- Please request colposcopy biopsy pathology report from  [place], MD  [place]  Patient has not heard back and is wondering what the neck step is in follow-up.     Thank you

## 2022-06-15 DIAGNOSIS — G5602 Carpal tunnel syndrome, left upper limb: Secondary | ICD-10-CM | POA: Diagnosis not present

## 2022-06-29 DIAGNOSIS — M542 Cervicalgia: Secondary | ICD-10-CM | POA: Diagnosis not present

## 2022-06-29 DIAGNOSIS — M9961 Osseous and subluxation stenosis of intervertebral foramina of cervical region: Secondary | ICD-10-CM | POA: Diagnosis not present

## 2022-06-29 DIAGNOSIS — G620 Drug-induced polyneuropathy: Secondary | ICD-10-CM | POA: Diagnosis not present

## 2022-06-29 DIAGNOSIS — M4802 Spinal stenosis, cervical region: Secondary | ICD-10-CM | POA: Diagnosis not present

## 2022-06-29 DIAGNOSIS — R29898 Other symptoms and signs involving the musculoskeletal system: Secondary | ICD-10-CM | POA: Diagnosis not present

## 2022-06-29 DIAGNOSIS — T451X5A Adverse effect of antineoplastic and immunosuppressive drugs, initial encounter: Secondary | ICD-10-CM | POA: Diagnosis not present

## 2022-06-30 ENCOUNTER — Other Ambulatory Visit: Payer: Self-pay | Admitting: Family Medicine

## 2022-06-30 ENCOUNTER — Ambulatory Visit
Admission: RE | Admit: 2022-06-30 | Discharge: 2022-06-30 | Disposition: A | Discharge status: Home / Self Care | Payer: Medicaid Other | Source: Ambulatory Visit | Attending: Family Medicine | Admitting: Family Medicine

## 2022-06-30 DIAGNOSIS — M4802 Spinal stenosis, cervical region: Secondary | ICD-10-CM | POA: Diagnosis not present

## 2022-06-30 DIAGNOSIS — R200 Anesthesia of skin: Secondary | ICD-10-CM | POA: Diagnosis not present

## 2022-06-30 DIAGNOSIS — M5412 Radiculopathy, cervical region: Secondary | ICD-10-CM | POA: Diagnosis not present

## 2022-06-30 DIAGNOSIS — M5030 Other cervical disc degeneration, unspecified cervical region: Secondary | ICD-10-CM | POA: Diagnosis not present

## 2022-06-30 NOTE — Progress Notes (Signed)
Referring Physician:  Denton Lank, FNP [id number] [place],  [place] [address]  Primary Physician:  Dana Allan, MD  History of Present Illness: 07/01/2022 [name] has a history of asthma, GERD, chemo induced neuropathy, history of breast CA.   Was seen by Dr. Adriana Simas in 2019 and he recommended ACDF. She declined and has been seeing PMR.   She has known severe left CTS and notes issues with hand dexterity, left > right. She is dropping things.   Saw Dr. Rosita Kea for her CTS on left and he wanted her to have further evaluation of cervical spine.   She has chronic constant neck pain with left arm pain into her hand that has been worse in last 3 weeks with new left arm weakness. She has numbness in small and ring finger. She is dropping things. She notes some balance issues- feels like she veers to the side with walking. No right arm pain. Some relief with medications.   In review of her chart, she had 5/5 strength in bilateral upper extremities with 5-/5 strength in bilateral triceps when she saw [facility] on 06/07/22.   Bowel/Bladder Dysfunction: none  Conservative measures:  Physical therapy: none for cervical spine  Multimodal medical therapy including regular antiinflammatories: tylenol, cymbalta, neurontin, mobic, robaxin, zanaflex, ultram, motrin Injections:  05/16/2022: Left C5-6 transforaminal ESI (50% relief, Dr. Mariah Milling) 09/03/2021: Right C5-6 transforaminal ESI (75% relief) 03/31/2021: Right C5-6 transforaminal ESI (80% relief) 08/19/2020: Left C5-6 transforaminal ESI (90% relief) 05/31/2019: Right C5-6 transforaminal ESI (75% relief) 05/10/2019: Right C5-6 transforaminal ESI (50-60% relief) 06/28/2018: Right C4, C5 and C6 medial branch radiofrequency neurotomy (50% relief) 04/03/2018: Right C4, C5 and C6 medial branch blocks (preinjection pain 9/10 and postinjection pain 2/10) 02/27/2018: Right C4, C5 and C6 medial branch blocks (preinjection pain 9/10 and  postinjection pain 2/10.)   Past Surgery: no spinal surgery  Deboraha Sprang has some symptoms of cervical myelopathy. She is dropping things and feels like she veers to one side when walking.   The symptoms are causing a significant impact on the patient's life.   Review of Systems:  A 10 point review of systems is negative, except for the pertinent positives and negatives detailed in the HPI.  Past Medical History: Past Medical History:  Diagnosis Date   Anxiety    Asthma    as a child   Breast cancer    Carpal tunnel syndrome    R hand    Colon polyps    Depression    Eosinophilic esophagitis    noted in [place] with GI path report 09/29/14    Esophageal stricture    s/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 Raul Del    Family history of breast cancer    Family history of prostate cancer    Gastroesophageal reflux disease without esophagitis 04/09/2022   Herniated disc, cervical    s/p epidural injection   Herpes    History of kidney stones    Hyperlipidemia    Low back pain    Migraines    Personal history of chemotherapy    Personal history of radiation therapy     Past Surgical History: Past Surgical History:  Procedure Laterality Date   BREAST BIOPSY Left 12/25/2018   [place] BX, invasive mammary carcinoma    BREAST LUMPECTOMY     BREAST LUMPECTOMY WITH NEEDLE LOCALIZATION AND AXILLARY SENTINEL LYMPH NODE BX Left 01/16/2019   Procedure: LEFT BREAST LUMPECTOMY WITH NEEDLE  LOCALIZATION AND SENTINEL LYMPH NODE BIOPSY;  Surgeon: Griselda Miner, MD;  Location: [facility] ORS;  Service: General;  Laterality: Left;   CESAREAN SECTION     COLONOSCOPY WITH PROPOFOL N/A 03/27/2017   Procedure: COLONOSCOPY WITH PROPOFOL;  Surgeon: Wyline Mood, MD;  Location[facility] ENDOSCOPY;  Service: Gastroenterology;  Laterality: N/A;   COLONOSCOPY WITH PROPOFOL N/A 04/04/2022   Procedure: COLONOSCOPY WITH PROPOFOL;  Surgeon: Wyline Mood, MD;  Location: [facility] ENDOSCOPY;   Service: Gastroenterology;  Laterality: N/A;   ESOPHAGOGASTRODUODENOSCOPY     ESOPHAGOGASTRODUODENOSCOPY (EGD) WITH PROPOFOL N/A 03/27/2017   Procedure: ESOPHAGOGASTRODUODENOSCOPY (EGD) WITH PROPOFOL WITH DILATION;  Surgeon: Wyline Mood, MD;  Location: [facility] ENDOSCOPY;  Service: Gastroenterology;  Laterality: N/A;   PORTACATH PLACEMENT N/A 02/15/2019   Procedure: INSERTION PORT-A-CATH WITH POSSIBLE ULTRASOUND;  Surgeon: Griselda Miner, MD;  Location: [facility] ORS;  Service: General;  Laterality: N/A;   RE-EXCISION OF BREAST CANCER,SUPERIOR MARGINS Left 02/15/2019   Procedure: RE-EXCISION OF LEFT BREAST CANCER ANTERIOR MARGINS;  Surgeon: Griselda Miner, MD;  Location: [facility] ORS;  Service: General;  Laterality: Left;   THROAT SURGERY  2015   UMBILICAL HERNIA REPAIR  2006    x 2   w mesh per pt    Allergies: Allergies as of 07/01/2022 - Review Complete 07/01/2022  Allergen Reaction Noted   Hydrocodone-acetaminophen Hives 01/24/2019   Lactose Other (See Comments) 01/10/2019   Lactose intolerance (gi) Other (See Comments) 01/10/2019   Penicillins Rash 06/01/2016   Sulfa antibiotics Rash 06/01/2016    Medications: Outpatient Encounter Medications as of 07/01/2022  Medication Sig   acetaminophen (TYLENOL) 500 MG tablet Take 500-1,000 mg by mouth every 6 (six) hours as needed (for pain.).   albuterol (PROVENTIL) (2.5 MG/3ML) 0.083% nebulizer solution Take 3 mLs (2.5 mg total) by nebulization every 6 (six) hours as needed for wheezing or shortness of breath.   albuterol (VENTOLIN HFA) 108 (90 Base) MCG/ACT inhaler Inhale 2 puffs into the lungs every 4 (four) hours as needed for wheezing or shortness of breath.   anastrozole (ARIMIDEX) 1 MG tablet Take 1 tablet (1 mg total) by mouth daily.   atorvastatin (LIPITOR) 40 MG tablet Take 1 tablet (40 mg total) by mouth daily.   budesonide-formoterol (SYMBICORT) 160-4.5 MCG/ACT inhaler Inhale 2 puffs into the lungs in the morning and at bedtime.   cetirizine  (ZYRTEC) 10 MG tablet Take 1 tablet (10 mg total) by mouth daily as needed for allergies.   Cholecalciferol (VITAMIN D-3) 125 MCG (5000 UT) TABS Take 5,000 Units by mouth daily.   Cyanocobalamin (VITAMIN B-12 PO) Take 1 tablet by mouth daily. 1000mg  daily   DULoxetine (CYMBALTA) 60 MG capsule TAKE 1 CAPSULE BY MOUTH EVERY DAY   gabapentin (NEURONTIN) 300 MG capsule TAKE 1 CAPSULE BY MOUTH THREE TIMES A DAY   meclizine (ANTIVERT) 12.5 MG tablet TAKE 1 TABLET BY MOUTH 3 TIMES DAILY AS NEEDED FOR DIZZINESS.   meloxicam (MOBIC) 15 MG tablet Take 1 tablet (15 mg total) by mouth daily as needed for pain.   methocarbamol (ROBAXIN) 750 MG tablet Take 1 tablet (750 mg total) by mouth every 6 (six) hours as needed for muscle spasms.   omeprazole (PRILOSEC) 40 MG capsule Take 1 capsule (40 mg total) by mouth every evening. 30 min before food   tiZANidine (ZANAFLEX) 4 MG tablet Take 4 mg by mouth at bedtime.   traMADol (ULTRAM) 50 MG tablet Take by mouth every 6 (six) hours as needed.   traZODone (DESYREL) 50  MG tablet Take 1-1.5 tablets (50-75 mg total) by mouth at bedtime as needed for sleep.   valACYclovir (VALTREX) 1000 MG tablet Take 1 tablet (1,000 mg total) by mouth daily. Suppression. BID X 5 days with outbreak as needed (Patient taking differently: Take 1,000 mg by mouth as needed. Suppression. BID X 5 days with outbreak as needed)   [DISCONTINUED] prochlorperazine (COMPAZINE) 10 MG tablet Take 1 tablet (10 mg total) by mouth every 6 (six) hours as needed (Nausea or vomiting). (Patient not taking: Reported on 05/13/2019)   No facility-administered encounter medications on file as of 07/01/2022.    Social History: Social History   Tobacco Use   Smoking status: Former    Packs/day: 2.00    Years: 20.00    Additional pack years: 0.00    Total pack years: 40.00    Types: Cigarettes    Quit date: 06/02/2006    Years since quitting: 16.0   Smokeless tobacco: Never  Vaping Use   Vaping Use: Never  used  Substance Use Topics   Alcohol use: Yes   Drug use: No    Family Medical History: Family History  Problem Relation Age of Onset   Breast cancer Mother        dx late 35s   Thyroid disease Mother    Colon polyps Mother    Prostate cancer Father        dx 22   Breast cancer Maternal Grandmother        dx late 61s   Osteoporosis Maternal Grandmother    Cancer Maternal Uncle        possibly, unsure type    Physical Examination: Vitals:   07/01/22 1128  BP: 133/80  Pulse: 93  SpO2: 98%    General: Patient is well developed, well nourished, calm, collected, and in no apparent distress. Attention to examination is appropriate.  Respiratory: Patient is breathing without any difficulty.   NEUROLOGICAL:     Awake, alert, oriented to person, place, and time.  Speech is clear and fluent. Fund of knowledge is appropriate.   Cranial Nerves: Pupils equal round and reactive to light.  Facial tone is symmetric.    No posterior cervical tenderness. Mild tenderness in bilateral trapezial region.   No abnormal lesions on exposed skin.   Strength: Side Biceps Triceps Deltoid Interossei Grip Wrist Ext. Wrist Flex.  R L 4+ 4+ 4+ 54+   Side Iliopsoas Quads Hamstring PF DF EHL  R L Reflexes are 2+ and symmetric at the biceps, triceps, brachioradialis, patella and achilles.   Hoffman's is absent.  Clonus is not present.   Bilateral upper and lower extremity sensation is intact to light touch, but diminished left arm especially left small/ring finger.   Gait is normal.    Medical Decision Making  Imaging: Cervical xrays dated 06/29/22:  Slip at C3-C4 and C4-C5 with severe DDD/spondylosis C5-C7. Reversal of normal cervical lordosis.  No radiology report available for above xrays.    Cervical MRI dated 06/30/22:  FINDINGS: Alignment: There is reversal of the normal cervical lordosis. Facet degenerative change results in  0.4 cm anterolisthesis C3 on C4 and 0.2 cm anterolisthesis C4 on C5.   Vertebrae: No fracture, evidence of discitis, or bone lesion. Degenerative endplate signal change is seen at C7-T1.   Cord: Normal signal throughout.   Posterior  Fossa, vertebral arteries, paraspinal tissues: Negative.   Disc levels:   C2-3: The right facets are ankylosed. Minimal disc bulge without central canal stenosis is seen. There is mild bilateral foraminal narrowing.   C3-4. Advanced facet degenerative change is present on the right. The disc is uncovered with a shallow bulge. Disc effaces the ventral thecal sac. Moderately severe to severe foraminal narrowing is worse on the right.   C4-5: Right much worse than left facet degenerative change. The disc is uncovered with a shallow bulge there is some uncovertebral spurring. Severe right foraminal narrowing is seen. The central canal and left foramen open.   C5-6: There is loss of disc space height shallow broad-based bulge. Bulging disc and reversal of lordosis result in flattening of the ventral cord. Moderate to moderately severe foraminal narrowing is worse on the right.   C6-7: There is a broad-based disc osteophyte complex and uncovertebral spurring. Ligamentum flavum thickening is more notable on the left. Marked flattening of the ventral cord is present and there is severe bilateral foraminal narrowing.   C7-T1: A broad-based disc bulge and ligamentum flavum thickening cause flattening of the ventral cord. Moderate to moderately severe foraminal narrowing is worse on the left.   T1-2: Negative.   IMPRESSION: Reversal of the normal cervical lordosis with facet mediated anterolisthesis C3 on C4 and C4 on C5.   Marked flattening of the ventral cord at C6-7 where there is also severe bilateral foraminal narrowing.   Mild to moderate flattening of the ventral cord at C5-6 where moderate to moderately severe foraminal narrowing is worse  the right.   Moderate to marked flattening of the ventral cord at C7-T1 where moderate to moderately severe foraminal narrowing is worse on the left.   Disc effaces the ventral thecal sac at C3-4. Moderately severe to severe foraminal narrowing at C3-4 is worse on the right.   Multilevel facet arthropathy appears worst on the right at C3-4 and C4-5.   Electronically Signed   By: Drusilla Kanner M.D.   On: 06/30/2022 10:25  I have personally reviewed the images and agree with the above interpretation.   EMG nerve conduction test on 06/15/2022 at [facility] reports a severe left median neuropathy, no cervical radiculopathy.   Further review of her imaging over the years shows she had cord signal change at C4 on scan from 12/19/17. No cord signal change seen on scan from 10/09/18 or above scan.   She had LP and per Dr. Patsey Berthold note on 08/21/18-   CSF to serum oligoclonal band differential was 4. Given this, there was concern for inflammatory disorder. He recommended neurology referral.   EMG/NCS performed on 07/23/2018 by Dr. Sherryll Burger revealed chronic C6 radiculopathy on the right.   Assessment and Plan: Ms. Skillman is a pleasant 57 y.o. female has chronic constant neck pain with left arm pain into her hand that has been worse in last 3 weeks with new left arm weakness. She has numbness in small and ring finger. She is dropping things. She notes some balance issues- feels like she veers to the side with walking. No right arm pain. Some relief with medications.   She has known loss of cervical lordosis with severe cervical spondylosis/DDD C5-C7. Has slip at C3-C4 and C4-C5.   MRI shows multilevel foraminal stenosis and flattening of ventral cord.   Treatment options discussed with patient and following plan made:   - CT of cervical spine ordered. Will get at [facility].  -  Will get flex/ext xrays of cervical spine as well.  - Follow up with [name] after above studies to  discuss further treatment options. Appointment made.   I spent a total of 40 minutes in face-to-face and non-face-to-face activities related to this patient's care today including review of outside records, review of imaging, review of symptoms, physical exam, discussion of differential diagnosis, discussion of treatment options, and documentation.   Thank you for involving me in the care of this patient.   Drake Leach PA-C Dept. of Neurosurgery

## 2022-07-01 ENCOUNTER — Encounter: Payer: Self-pay | Admitting: Orthopedic Surgery

## 2022-07-01 ENCOUNTER — Inpatient Hospital Stay
Admission: RE | Admit: 2022-07-01 | Discharge: 2022-07-01 | Disposition: A | Discharge status: Home / Self Care | Payer: Self-pay | Source: Ambulatory Visit | Attending: Orthopedic Surgery | Admitting: Orthopedic Surgery

## 2022-07-01 ENCOUNTER — Other Ambulatory Visit: Payer: Self-pay

## 2022-07-01 ENCOUNTER — Ambulatory Visit (INDEPENDENT_AMBULATORY_CARE_PROVIDER_SITE_OTHER): Payer: Medicaid Other | Admitting: Orthopedic Surgery

## 2022-07-01 ENCOUNTER — Encounter: Payer: Self-pay | Admitting: Oncology

## 2022-07-01 ENCOUNTER — Ambulatory Visit
Admission: RE | Admit: 2022-07-01 | Discharge: 2022-07-01 | Disposition: A | Discharge status: Home / Self Care | Payer: Self-pay | Source: Ambulatory Visit | Attending: Orthopedic Surgery | Admitting: Orthopedic Surgery

## 2022-07-01 VITALS — BP 133/80 | HR 93 | Ht 63.0 in | Wt 185.2 lb

## 2022-07-01 DIAGNOSIS — Z049 Encounter for examination and observation for unspecified reason: Secondary | ICD-10-CM

## 2022-07-01 DIAGNOSIS — M5412 Radiculopathy, cervical region: Secondary | ICD-10-CM

## 2022-07-01 DIAGNOSIS — M4312 Spondylolisthesis, cervical region: Secondary | ICD-10-CM | POA: Diagnosis not present

## 2022-07-01 DIAGNOSIS — M40202 Unspecified kyphosis, cervical region: Secondary | ICD-10-CM

## 2022-07-01 DIAGNOSIS — M5030 Other cervical disc degeneration, unspecified cervical region: Secondary | ICD-10-CM | POA: Diagnosis not present

## 2022-07-01 DIAGNOSIS — R29898 Other symptoms and signs involving the musculoskeletal system: Secondary | ICD-10-CM

## 2022-07-01 NOTE — Patient Instructions (Signed)
It was so nice to see you today. Thank you so much for coming in.    You have wear and tear in your neck with reversal of the normal curve. This is likely causing your neck and left arm pain/weakness.   I want to get an CT of your neck to look into things further. We will get this approved through your insurance and [facility] will call you to schedule the appointment.   I ordered some specific xrays of your neck. You can get these when you get the CT scan. Remind them that the order is in. You do not need any appointment.   You can call [phone] to schedule your CT.   Once I get the CT and xray results back, I will message you on MyChart.   I made you a follow up with [name].   Please do not hesitate to call if you have any questions or concerns. You can also message me in MyChart.   Drake Leach PA-C [phone]

## 2022-07-07 ENCOUNTER — Other Ambulatory Visit: Payer: Self-pay | Admitting: Orthopedic Surgery

## 2022-07-07 ENCOUNTER — Ambulatory Visit
Admission: RE | Admit: 2022-07-07 | Discharge: 2022-07-07 | Disposition: A | Discharge status: Home / Self Care | Payer: Medicaid Other | Source: Ambulatory Visit | Attending: Orthopedic Surgery | Admitting: Orthopedic Surgery

## 2022-07-07 DIAGNOSIS — R29898 Other symptoms and signs involving the musculoskeletal system: Secondary | ICD-10-CM | POA: Insufficient documentation

## 2022-07-07 DIAGNOSIS — M5030 Other cervical disc degeneration, unspecified cervical region: Secondary | ICD-10-CM | POA: Diagnosis not present

## 2022-07-07 DIAGNOSIS — M4312 Spondylolisthesis, cervical region: Secondary | ICD-10-CM | POA: Diagnosis not present

## 2022-07-07 DIAGNOSIS — M5412 Radiculopathy, cervical region: Secondary | ICD-10-CM | POA: Insufficient documentation

## 2022-07-07 DIAGNOSIS — M40202 Unspecified kyphosis, cervical region: Secondary | ICD-10-CM

## 2022-07-08 ENCOUNTER — Encounter: Payer: Self-pay | Admitting: Student in an Organized Health Care Education/Training Program

## 2022-07-08 ENCOUNTER — Ambulatory Visit: Payer: Medicaid Other | Admitting: Student in an Organized Health Care Education/Training Program

## 2022-07-08 VITALS — BP 136/78 | HR 100 | Temp 97.9°F | Ht 63.0 in | Wt 184.6 lb

## 2022-07-08 DIAGNOSIS — J4540 Moderate persistent asthma, uncomplicated: Secondary | ICD-10-CM | POA: Diagnosis not present

## 2022-07-08 NOTE — Progress Notes (Unsigned)
Referring Physician:  Dana Allan, MD [address] [place],  [place] [address]  Primary Physician:  Dana Allan, MD  History of Present Illness: 07/12/2022 Leah [name] is here today with a chief complaint of neck pain and left arm weakness.  She has been having neck pain for many years but is been worsening over time.  She has weakness in her left hand. she has been dropping items.  This has been worsening over time.  She is having tingling and numbness in her left hand particular towards her fourth and fifth digit.  Nothing really makes it worse or better.  The good days pain at 4 out of 10.  It can be as bad as 10 out of 10. Bowel/Bladder Dysfunction: none  Conservative measures:  Physical therapy: has not participated Multimodal medical therapy including regular antiinflammatories:  tylenol,gabapentin,methocarbamol, meloxicam, tramadol Injections: has received epidural steroid injections  05/16/2022: Left C5-6 transforaminal ESI (50% relief, Dr. Mariah Milling) 09/03/2021: Right C5-6 transforaminal ESI (75% relief) 03/31/2021: Right C5-6 transforaminal ESI (80% relief) 08/19/2020: Left C5-6 transforaminal ESI (90% relief) 05/31/2019: Right C5-6 transforaminal ESI (75% relief) 05/10/2019: Right C5-6 transforaminal ESI (50-60% relief) 06/28/2018: Right C4, C5 and C6 medial branch radiofrequency neurotomy (50% relief) 04/03/2018: Right C4, C5 and C6 medial branch blocks (preinjection pain 9/10 and postinjection pain 2/10) 02/27/2018: Right C4, C5 and C6 medial branch blocks (preinjection pain 9/10 and postinjection pain 2/10.)     Past Surgery:  denies  Ife Vitelli has some symptoms of cervical myelopathy.  07/01/2022 Ms. Bowen Goyal has a history of asthma, GERD, chemo induced neuropathy, history of breast CA.   Was seen by Dr. Adriana Simas in 2019 and he recommended ACDF. She declined and has been seeing PMR.   She has known severe left CTS and notes issues with hand  dexterity, left > right. She is dropping things.   Saw Dr. Rosita Kea for her CTS on left and he wanted her to have further evaluation of cervical spine.   She has chronic constant neck pain with left arm pain into her hand that has been worse in last 3 weeks with new left arm weakness. She has numbness in small and ring finger. She is dropping things. She notes some balance issues- feels like she veers to the side with walking. No right arm pain. Some relief with medications.    In review of her chart, she had 5/5 strength in bilateral upper extremities with 5-/5 strength in bilateral triceps when she saw [facility] on 06/07/22.      The symptoms are causing a significant impact on the patient's life.   I have utilized the care everywhere function in epic to review the outside records available from external health systems.  Review of Systems:  A 10 point review of systems is negative, except for the pertinent positives and negatives detailed in the HPI.  Past Medical History: Past Medical History:  Diagnosis Date   Anxiety    Asthma    as a child   Breast cancer (HCC)    Carpal tunnel syndrome    R hand    Colon polyps    Depression    Eosinophilic esophagitis    noted in [place] with GI path report 09/29/14    Esophageal stricture    s/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 Raul Del    Family history of breast cancer    Family history of prostate cancer    Gastroesophageal reflux disease  without esophagitis 04/09/2022   Herniated disc, cervical    s/p epidural injection   Herpes    History of kidney stones    Hyperlipidemia    Low back pain    Migraines    Personal history of chemotherapy    Personal history of radiation therapy     Past Surgical History: Past Surgical History:  Procedure Laterality Date   BREAST BIOPSY Left 12/25/2018   [place] BX, invasive mammary carcinoma    BREAST LUMPECTOMY     BREAST LUMPECTOMY WITH NEEDLE LOCALIZATION AND AXILLARY  SENTINEL LYMPH NODE BX Left 01/16/2019   Procedure: LEFT BREAST LUMPECTOMY WITH NEEDLE LOCALIZATION AND SENTINEL LYMPH NODE BIOPSY;  Surgeon: Griselda Miner, MD;  Location: [facility] ORS;  Service: General;  Laterality: Left;   CESAREAN SECTION     COLONOSCOPY WITH PROPOFOL N/A 03/27/2017   Procedure: COLONOSCOPY WITH PROPOFOL;  Surgeon: Wyline Mood, MD;  Location: [facility] ENDOSCOPY;  Service: Gastroenterology;  Laterality: N/A;   COLONOSCOPY WITH PROPOFOL N/A 04/04/2022   Procedure: COLONOSCOPY WITH PROPOFOL;  Surgeon: Wyline Mood, MD;  Location: [facility] ENDOSCOPY;  Service: Gastroenterology;  Laterality: N/A;   ESOPHAGOGASTRODUODENOSCOPY     ESOPHAGOGASTRODUODENOSCOPY (EGD) WITH PROPOFOL N/A 03/27/2017   Procedure: ESOPHAGOGASTRODUODENOSCOPY (EGD) WITH PROPOFOL WITH DILATION;  Surgeon: Wyline Mood, MD;  Location: [facility] ENDOSCOPY;  Service: Gastroenterology;  Laterality: N/A;   PORTACATH PLACEMENT N/A 02/15/2019   Procedure: INSERTION PORT-A-CATH WITH POSSIBLE ULTRASOUND;  Surgeon: Griselda Miner, MD;  Location: [facility] ORS;  Service: General;  Laterality: N/A;   RE-EXCISION OF BREAST CANCER,SUPERIOR MARGINS Left 02/15/2019   Procedure: RE-EXCISION OF LEFT BREAST CANCER ANTERIOR MARGINS;  Surgeon: Griselda Miner, MD;  Location: [facility] ORS;  Service: General;  Laterality: Left;   THROAT SURGERY  2015   UMBILICAL HERNIA REPAIR  2006    x 2   w mesh per pt    Allergies: Allergies as of 07/12/2022 - Review Complete 07/12/2022  Allergen Reaction Noted   Hydrocodone-acetaminophen Hives 01/24/2019   Lactose Other (See Comments) 01/10/2019   Lactose intolerance (gi) Other (See Comments) 01/10/2019   Penicillins Rash 06/01/2016   Sulfa antibiotics Rash 06/01/2016    Medications:  Current Outpatient Medications:    traMADol (ULTRAM) 50 MG tablet, Take 50 mg by mouth 2 (two) times daily as needed., Disp: , Rfl:    acetaminophen (TYLENOL) 500 MG tablet, Take 500-1,000 mg by mouth every 6 (six) hours as needed (for  pain.)., Disp: , Rfl:    albuterol (PROVENTIL) (2.5 MG/3ML) 0.083% nebulizer solution, Take 3 mLs (2.5 mg total) by nebulization every 6 (six) hours as needed for wheezing or shortness of breath., Disp: 150 mL, Rfl: 1   albuterol (VENTOLIN HFA) 108 (90 Base) MCG/ACT inhaler, Inhale 2 puffs into the lungs every 4 (four) hours as needed for wheezing or shortness of breath., Disp: 54 g, Rfl: 11   anastrozole (ARIMIDEX) 1 MG tablet, Take 1 tablet (1 mg total) by mouth daily., Disp: 90 tablet, Rfl: 2   atorvastatin (LIPITOR) 40 MG tablet, Take 1 tablet (40 mg total) by mouth daily., Disp: 90 tablet, Rfl: 3   budesonide-formoterol (SYMBICORT) 160-4.5 MCG/ACT inhaler, Inhale 2 puffs into the lungs in the morning and at bedtime., Disp: 1 each, Rfl: 12   cetirizine (ZYRTEC) 10 MG tablet, Take 1 tablet (10 mg total) by mouth daily as needed for allergies., Disp: 90 tablet, Rfl: 3   Cholecalciferol (VITAMIN D-3) 125 MCG (5000 UT) TABS, Take 5,000 Units by mouth daily., Disp: ,  Rfl:    Cyanocobalamin (VITAMIN B-12 PO), Take 1 tablet by mouth daily. 1000mg  daily, Disp: , Rfl:    DULoxetine (CYMBALTA) 60 MG capsule, TAKE 1 CAPSULE BY MOUTH EVERY DAY, Disp: 90 capsule, Rfl: 1   gabapentin (NEURONTIN) 300 MG capsule, TAKE 1 CAPSULE BY MOUTH THREE TIMES A DAY, Disp: 90 capsule, Rfl: 2   meclizine (ANTIVERT) 12.5 MG tablet, TAKE 1 TABLET BY MOUTH 3 TIMES DAILY AS NEEDED FOR DIZZINESS., Disp: 90 tablet, Rfl: 3   omeprazole (PRILOSEC) 40 MG capsule, Take 1 capsule (40 mg total) by mouth every evening. 30 min before food, Disp: 90 capsule, Rfl: 3   tiZANidine (ZANAFLEX) 4 MG tablet, Take 4 mg by mouth at bedtime., Disp: , Rfl:    traZODone (DESYREL) 50 MG tablet, Take 1-1.5 tablets (50-75 mg total) by mouth at bedtime as needed for sleep., Disp: 140 tablet, Rfl: 11   valACYclovir (VALTREX) 1000 MG tablet, Take 1 tablet (1,000 mg total) by mouth daily. Suppression. BID X 5 days with outbreak as needed (Patient taking  differently: Take 1,000 mg by mouth as needed. Suppression. BID X 5 days with outbreak as needed), Disp: 120 tablet, Rfl: 3  Social History: Social History   Tobacco Use   Smoking status: Former    Packs/day: 2.00    Years: 20.00    Additional pack years: 0.00    Total pack years: 40.00    Types: Cigarettes    Quit date: 06/02/2006    Years since quitting: 16.1   Smokeless tobacco: Never  Vaping Use   Vaping Use: Never used  Substance Use Topics   Alcohol use: Yes   Drug use: No    Family Medical History: Family History  Problem Relation Age of Onset   Breast cancer Mother        dx late 36s   Thyroid disease Mother    Colon polyps Mother    Prostate cancer Father        dx 47   Breast cancer Maternal Grandmother        dx late 35s   Osteoporosis Maternal Grandmother    Cancer Maternal Uncle        possibly, unsure type    Physical Examination: Vitals:   07/12/22 0952  BP: (!) 165/94  Pulse: 82    General: Patient is well developed, well nourished, calm, collected, and in no apparent distress. Attention to examination is appropriate.  Neck:   Diminished extension  Respiratory: Patient is breathing without any difficulty.   NEUROLOGICAL:     Awake, alert, oriented to person, place, and time.  Speech is clear and fluent.   Cranial Nerves: Pupils equal round and reactive to light.  Facial tone is symmetric.  Facial sensation is symmetric. Shoulder shrug is symmetric. Tongue protrusion is midline.  There is no pronator drift.  ROM of spine: full.    Strength: Side Biceps Triceps Deltoid Interossei Grip Wrist Ext. Wrist Flex.  R 5 5 5 5 5 5 5   L 5 4+ 5 3 3 5 5    Side Iliopsoas Quads Hamstring PF DF EHL  R 5 5 5 5 5 5   L 5 5 5 5 5 5    Finger extension is 3 out of 5 on the left.  Hypothenar function is severely diminished.  Reflexes are 2+ and symmetric at the biceps, triceps, brachioradialis, patella and achilles.   Hoffman's is present on the left.    Bilateral upper and lower extremity sensation  is intact to light touch with exception of left C8 distribution which is diminished.    No evidence of dysmetria noted.  Gait is normal.     Medical Decision Making  Imaging: CT C spine 07/07/2022 IMPRESSION: 1. Advanced degenerative change of the cervical spine as above with disc degeneration most advanced at C5-C6 and C6-C7, and asymmetric right-sided facet arthropathy most advanced at C2-C3 through C4-C5. 2. Multilevel neural foraminal stenosis as detailed above. No greater than mild spinal canal stenosis at C5-C6.     Electronically Signed   By: Lesia Hausen M.D.   On: 07/12/2022 08:55  MRI C spine 06/30/2022 IMPRESSION: Reversal of the normal cervical lordosis with facet mediated anterolisthesis C3 on C4 and C4 on C5.   Marked flattening of the ventral cord at C6-7 where there is also severe bilateral foraminal narrowing.   Mild to moderate flattening of the ventral cord at C5-6 where moderate to moderately severe foraminal narrowing is worse the right.   Moderate to marked flattening of the ventral cord at C7-T1 where moderate to moderately severe foraminal narrowing is worse on the left.   Disc effaces the ventral thecal sac at C3-4. Moderately severe to severe foraminal narrowing at C3-4 is worse on the right.   Multilevel facet arthropathy appears worst on the right at C3-4 and C4-5.     Electronically Signed   By: Drusilla Kanner M.D.   On: 06/30/2022 10:25    C spine flexion and extension 07/07/2022 Formal read pending.  Significant kyphosis centered at C5-7.      I have personally reviewed the images and agree with the above interpretation.  Assessment and Plan: Ms. Yerby is a pleasant 57 y.o. female with cervical myelopathy and radiculopathy causing left upper extremity weakness.  She has a cervical kyphosis centered about C5 and C6.  This is worsened over time in comparison to her x-rays from  2019.  She now has a substantial amount of kyphosis and has severe compression of the left C8 nerve root which is causing her left arm weakness.  She also has anterolisthesis of C3 on C4 and C4 on C5.  There is substantial cervical spondylosis noted on her x-rays and CT scan.  Given her objective weakness, no conservative management is indicated at this time.  I have recommended surgical intervention.  Because of the nature of her radiographic abnormalities, I rather extensive surgery is necessary to correct her stenosis as well as reestablish her normal cervical lordosis.  I recommended a C5-T1 anterior cervical discectomy and fusion followed by C2-T2 posterior instrumentation to help correct her deformity.  We will not be able to use bone morphogenetic protein as she has prior history of cancer.  A smaller operation would not be indicated.  She has significant anterolisthesis of C3 on C4 and C4 on C5 which is worsened over the past several years.  Leaving those levels without intervention would put her at very high risk of reoperation.  I discussed the planned procedure at length with the patient, including the risks, benefits, alternatives, and indications. The risks discussed include but are not limited to bleeding, infection, need for reoperation, spinal fluid leak, stroke, vision loss, anesthetic complication, coma, paralysis, and even death. I also described in detail that improvement was not guaranteed.  The patient expressed understanding of these risks, and asked that we proceed with surgery. I described the surgery in layman's terms, and gave ample opportunity for questions, which were answered to the best of  my ability.  I spent a total of 45 minutes in this patient's care today. This time was spent reviewing pertinent records including imaging studies, obtaining and confirming history, performing a directed evaluation, formulating and discussing my recommendations, and documenting the  visit within the medical record.     Thank you for involving me in the care of this patient.      Imogene Gravelle K. Myer Haff MD, [facility] Neurosurgery

## 2022-07-08 NOTE — Progress Notes (Signed)
Synopsis: Follow up for asthma  Assessment & Plan:   #Moderate persistent asthma   The patient has a history of childhood asthma that is active, requiring treatment with ICS/LABA controller therapy (Symbicort). She was started on during her last visit and has felt tremendously better. She had 5 exacerbations over the course of the summer and has had multiple courses of prednisone, and given allergen testing suggests Th2 asthma, and she is continuously exposed to dog dander, I would favor continuing with controller therapy without de-escalation. Pulmonary function testing is consistent with asthma, and does not suggest a COPD overlap. I have counseled her on the importance of having her dogs sleep outside her bedroom and limit exposure, but she is not sure she will be able to follow through.   Return in about 1 year (around 07/08/2023).  I spent 30 minutes caring for this patient today, including preparing to see the patient, obtaining a medical history , reviewing a separately obtained history, performing a medically appropriate examination and/or evaluation, counseling and educating the patient/family/caregiver, and documenting clinical information in the electronic health record  Raechel Chute, MD [facility] 07/08/2022 9:42 AM    End of visit medications:  No orders of the defined types were placed in this encounter.    Current Outpatient Medications:    acetaminophen (TYLENOL) 500 MG tablet, Take 500-1,000 mg by mouth every 6 (six) hours as needed (for pain.)., Disp: , Rfl:    albuterol (PROVENTIL) (2.5 MG/3ML) 0.083% nebulizer solution, Take 3 mLs (2.5 mg total) by nebulization every 6 (six) hours as needed for wheezing or shortness of breath., Disp: 150 mL, Rfl: 1   albuterol (VENTOLIN HFA) 108 (90 Base) MCG/ACT inhaler, Inhale 2 puffs into the lungs every 4 (four) hours as needed for wheezing or shortness of breath., Disp: 54 g, Rfl: 11   anastrozole (ARIMIDEX)  1 MG tablet, Take 1 tablet (1 mg total) by mouth daily., Disp: 90 tablet, Rfl: 2   atorvastatin (LIPITOR) 40 MG tablet, Take 1 tablet (40 mg total) by mouth daily., Disp: 90 tablet, Rfl: 3   budesonide-formoterol (SYMBICORT) 160-4.5 MCG/ACT inhaler, Inhale 2 puffs into the lungs in the morning and at bedtime., Disp: 1 each, Rfl: 12   cetirizine (ZYRTEC) 10 MG tablet, Take 1 tablet (10 mg total) by mouth daily as needed for allergies., Disp: 90 tablet, Rfl: 3   Cholecalciferol (VITAMIN D-3) 125 MCG (5000 UT) TABS, Take 5,000 Units by mouth daily., Disp: , Rfl:    Cyanocobalamin (VITAMIN B-12 PO), Take 1 tablet by mouth daily. 1000mg  daily, Disp: , Rfl:    DULoxetine (CYMBALTA) 60 MG capsule, TAKE 1 CAPSULE BY MOUTH EVERY DAY, Disp: 90 capsule, Rfl: 1   gabapentin (NEURONTIN) 300 MG capsule, TAKE 1 CAPSULE BY MOUTH THREE TIMES A DAY, Disp: 90 capsule, Rfl: 2   meclizine (ANTIVERT) 12.5 MG tablet, TAKE 1 TABLET BY MOUTH 3 TIMES DAILY AS NEEDED FOR DIZZINESS., Disp: 90 tablet, Rfl: 3   omeprazole (PRILOSEC) 40 MG capsule, Take 1 capsule (40 mg total) by mouth every evening. 30 min before food, Disp: 90 capsule, Rfl: 3   tiZANidine (ZANAFLEX) 4 MG tablet, Take 4 mg by mouth at bedtime., Disp: , Rfl:    traMADol (ULTRAM) 50 MG tablet, Take by mouth every 6 (six) hours as needed., Disp: , Rfl:    traZODone (DESYREL) 50 MG tablet, Take 1-1.5 tablets (50-75 mg total) by mouth at bedtime as needed for sleep., Disp: 140 tablet, Rfl:  11   valACYclovir (VALTREX) 1000 MG tablet, Take 1 tablet (1,000 mg total) by mouth daily. Suppression. BID X 5 days with outbreak as needed (Patient taking differently: Take 1,000 mg by mouth as needed. Suppression. BID X 5 days with outbreak as needed), Disp: 120 tablet, Rfl: 3   Subjective:   PATIENT ID: Leah [name] GENDER: female DOB: September 06, 1965, MRN: [id number]  Chief Complaint  Patient presents with   Follow-up    PFT- breathing has improved with symbicort.      HPI  The patient is a pleasant 57 year old female with a past medical history of childhood asthma presenting for follow up.   I initially saw her after a recent asthma exacerbation. Where she was seen in the ED. She was treated with nebulizers, steroids, and discharged with an Advair HFA inhaler.  She has had multiple episodes similar to this throughout the summer and has had 5 courses of prednisone over the summer to treat asthma exacerbations. No fevers or chills reported, no night sweats, no weight loss, no rash, and no chest pain. Patient has perennial allergies.   We started Symbicort during her previous visit which she reports helped improve her symptoms a lot. She feels much  better with it.No wheeze is reported, and symptoms fully resolved. She's only had to use her rescue inhaler once or twice since our last visit. Her allergen testing was positive for allergies to dog and cat dander. She does have two dogs that she allows to sleep in her bed. She had her PFT's which showed mild scooping on the flow volume loop.  Her past medical history is notable for breast cancer status postresection, chemotherapy, and radiation therapy.  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  She reports multiple allergies.  She had asthma growing up. She has a son who has multiple allergies.   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  She is a non-smoker but did smoke in the past, quit in 2008.  She smoked for 20 years with around 30 to 40 pack years of smoking history on her. She denies any illicit drug use and denies any vaping.  Ancillary information including prior medications, full medical/surgical/family/social histories, and PFTs (when available) are listed below and have been reviewed.   Review of Systems  Constitutional:  Negative for chills and fever.  Respiratory:  Negative for cough, hemoptysis, sputum production, shortness of breath and wheezing.    Cardiovascular:  Negative for chest pain, leg swelling and PND.  Skin:  Negative for rash.     Objective:   Vitals:   07/08/22 0926  BP: 136/78  Pulse: 100  Temp: 97.9 F (36.6 C)  TempSrc: Temporal  SpO2: 97%  Weight: 184 lb 9.6 oz (83.7 kg)  Height: 5\' 3"  (1.6 m)   97% on RA  BMI Readings from Last 3 Encounters:  07/08/22 32.70 kg/m  07/01/22 32.81 kg/m  06/13/22 32.66 kg/m   Wt Readings from Last 3 Encounters:  07/08/22 184 lb 9.6 oz (83.7 kg)  07/01/22 185 lb 3.2 oz (84 kg)  06/13/22 184 lb 6.4 oz (83.6 kg)    Physical Exam Constitutional:      General: She is not in acute distress.    Appearance: Normal appearance. She is not ill-appearing.  HENT:     Head: Normocephalic.     Nose: Nose normal.     Mouth/Throat:     Mouth: Mucous membranes are moist.  Cardiovascular:  Rate and Rhythm: Normal rate and regular rhythm.     Pulses: Normal pulses.     Heart sounds: Normal heart sounds.  Pulmonary:     Effort: Pulmonary effort is normal. No respiratory distress.     Breath sounds: Normal breath sounds. No wheezing or rales.  Abdominal:     Palpations: Abdomen is soft.  Musculoskeletal:        General: Normal range of motion.  Skin:    General: Skin is warm.  Neurological:     General: No focal deficit present.     Mental Status: She is alert and oriented to person, place, and time. Mental status is at baseline.       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 Raul Del    Family history of breast cancer    Family history of prostate cancer    Gastroesophageal reflux disease without esophagitis 04/09/2022   Herniated disc, cervical     s/p epidural injection   Herpes    History of kidney stones    Hyperlipidemia    Low back pain    Migraines    Personal history of chemotherapy    Personal history of radiation therapy      Family History  Problem Relation Age of Onset   Breast cancer Mother        dx late 74s   Thyroid disease Mother    Colon polyps Mother    Prostate cancer Father        dx 51   Breast cancer Maternal Grandmother        dx late 85s   Osteoporosis Maternal Grandmother    Cancer Maternal Uncle        possibly, unsure type     Past Surgical History:  Procedure Laterality Date   BREAST BIOPSY Left 12/25/2018   [place] BX, invasive mammary carcinoma    BREAST LUMPECTOMY     BREAST LUMPECTOMY WITH NEEDLE LOCALIZATION AND AXILLARY SENTINEL LYMPH NODE BX Left 01/16/2019   Procedure: LEFT BREAST LUMPECTOMY WITH NEEDLE LOCALIZATION AND SENTINEL LYMPH NODE BIOPSY;  Surgeon: Griselda Miner, MD;  Location: [facility] ORS;  Service: General;  Laterality: Left;   CESAREAN SECTION     COLONOSCOPY WITH PROPOFOL N/A 03/27/2017   Procedure: COLONOSCOPY WITH PROPOFOL;  Surgeon: Wyline Mood, MD;  [facility] ENDOSCOPY;  Service: Gastroenterology;  Laterality: N/A;   COLONOSCOPY WITH PROPOFOL N/A 04/04/2022   Procedure: COLONOSCOPY WITH PROPOFOL;  Surgeon: Wyline Mood, MD;  Location: [facility] ENDOSCOPY;  Service: Gastroenterology;  Laterality: N/A;   ESOPHAGOGASTRODUODENOSCOPY     ESOPHAGOGASTRODUODENOSCOPY (EGD) WITH PROPOFOL N/A 03/27/2017   Procedure: ESOPHAGOGASTRODUODENOSCOPY (EGD) WITH PROPOFOL WITH DILATION;  Surgeon: Wyline Mood, MD;  Location: [facility] ENDOSCOPY;  Service: Gastroenterology;  Laterality: N/A;   PORTACATH PLACEMENT N/A 02/15/2019   Procedure: INSERTION PORT-A-CATH WITH POSSIBLE ULTRASOUND;  Surgeon: Griselda Miner, MD;  Location: [facility] ORS;  Service: General;  Laterality: N/A;   RE-EXCISION OF BREAST CANCER,SUPERIOR MARGINS Left 02/15/2019   Procedure: RE-EXCISION OF LEFT BREAST CANCER ANTERIOR MARGINS;   Surgeon:  Griselda Miner, MD;  Location: [facility] ORS;  Service: General;  Laterality: Left;   THROAT SURGERY  2015   UMBILICAL HERNIA REPAIR  2006    x 2   w mesh per pt    Social History   Socioeconomic History   Marital status: Single    Spouse name: Not on file   Number of children: Not on file   Years of education: Not on file   Highest education level: 12th grade  Occupational History   Not on file  Tobacco Use   Smoking status: Former    Packs/day: 2.00    Years: 20.00    Additional pack years: 0.00    Total pack years: 40.00    Types: Cigarettes    Quit date: 06/02/2006    Years since quitting: 16.1   Smokeless tobacco: Never  Vaping Use   Vaping Use: Never used  Substance and Sexual Activity   Alcohol use: Yes   Drug use: No   Sexual activity: Yes    Partners: Male  Other Topics Concern   Not on file  Social History Narrative   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 As of 02/26/17    Divorced.    Former smoker 20+ years quit in 1997 then started again for 5 years and quit 10-12 years ago as of 03/08/17    Social Determinants of Health   Financial Resource Strain: High Risk (06/09/2022)   Overall Financial Resource Strain (CARDIA)    Difficulty of Paying Living Expenses: Very hard  Food Insecurity: Food Insecurity Present (06/09/2022)   Hunger Vital Sign    Worried About Running Out of Food in the Last Year: Sometimes true    Ran Out of Food in the Last Year: Never true  Transportation Needs: No Transportation Needs (06/09/2022)   PRAPARE - Administrator, Civil Service (Medical): No    Lack of Transportation (Non-Medical): No  Physical Activity: Insufficiently Active (06/09/2022)   Exercise Vital Sign    Days of Exercise per Week: 3 days    Minutes of Exercise per Session: 10 min  Stress: Stress Concern Present (06/09/2022)   [employer] of Occupational  Health - Occupational Stress Questionnaire    Feeling of Stress : Very much  Social Connections: Socially Isolated (06/09/2022)   Social Connection and Isolation Panel [NHANES]    Frequency of Communication with Friends and Family: Once a week    Frequency of Social Gatherings with Friends and Family: Never    Attends Religious Services: 1 to 4 times per year    Active Member of [employer] or Organizations: No    Attends Engineer, structural: Not on file    Marital Status: Divorced  Intimate Partner Violence: Not on file     Allergies  Allergen Reactions   Hydrocodone-Acetaminophen Hives    All over her body.   Lactose Other (See Comments)    Bloating and GI upset   Lactose Intolerance (Gi) Other (See Comments)    Bloating and GI upset   Penicillins Rash    Did it involve swelling of the face/tongue/throat, SOB, or low BP? Unknown Did it involve sudden or severe rash/hives, skin peeling, or any reaction on the inside of your mouth or nose? Yes Did you need to seek medical attention at a hospital or doctor's office? Unknown When  did it last happen? Childhood reaction.      If all above answers are "NO", may proceed with cephalosporin use.    Sulfa Antibiotics Rash    Full body rash      CBC    Component Value Date/Time   WBC 10.7 (H) 05/27/2022 1319   RBC 4.25 05/27/2022 1319   HGB 13.6 05/27/2022 1319   HCT 39.2 05/27/2022 1319   PLT 313 05/27/2022 1319   MCV 92.2 05/27/2022 1319   MCH 32.0 05/27/2022 1319   MCHC 34.7 05/27/2022 1319   RDW 11.5 05/27/2022 1319   LYMPHSABS 2.1 05/27/2022 1319   MONOABS 1.2 (H) 05/27/2022 1319   EOSABS 0.4 05/27/2022 1319   BASOSABS 0.1 05/27/2022 1319    Pulmonary Functions Testing Results:    Latest Ref Rng & Units 05/12/2022    9:34 AM  PFT Results  FVC-Pre L 3.13   FVC-Predicted Pre % 93   FVC-Post L 3.21   FVC-Predicted Post % 96   Pre FEV1/FVC % % 72   Post FEV1/FCV % % 75   FEV1-Pre L 2.27   FEV1-Predicted Pre %  86   FEV1-Post L 2.40   DLCO uncorrected ml/min/mmHg 16.67   DLCO UNC% % 83   DLVA Predicted % 80   TLC L 5.20   TLC % Predicted % 106   RV % Predicted % 109     Outpatient Medications Prior to Visit  Medication Sig Dispense Refill   acetaminophen (TYLENOL) 500 MG tablet Take 500-1,000 mg by mouth every 6 (six) hours as needed (for pain.).     albuterol (PROVENTIL) (2.5 MG/3ML) 0.083% nebulizer solution Take 3 mLs (2.5 mg total) by nebulization every 6 (six) hours as needed for wheezing or shortness of breath. 150 mL 1   albuterol (VENTOLIN HFA) 108 (90 Base) MCG/ACT inhaler Inhale 2 puffs into the lungs every 4 (four) hours as needed for wheezing or shortness of breath. 54 g 11   anastrozole (ARIMIDEX) 1 MG tablet Take 1 tablet (1 mg total) by mouth daily. 90 tablet 2   atorvastatin (LIPITOR) 40 MG tablet Take 1 tablet (40 mg total) by mouth daily. 90 tablet 3   budesonide-formoterol (SYMBICORT) 160-4.5 MCG/ACT inhaler Inhale 2 puffs into the lungs in the morning and at bedtime. 1 each 12   cetirizine (ZYRTEC) 10 MG tablet Take 1 tablet (10 mg total) by mouth daily as needed for allergies. 90 tablet 3   Cholecalciferol (VITAMIN D-3) 125 MCG (5000 UT) TABS Take 5,000 Units by mouth daily.     Cyanocobalamin (VITAMIN B-12 PO) Take 1 tablet by mouth daily. 1000mg  daily     DULoxetine (CYMBALTA) 60 MG capsule TAKE 1 CAPSULE BY MOUTH EVERY DAY 90 capsule 1   gabapentin (NEURONTIN) 300 MG capsule TAKE 1 CAPSULE BY MOUTH THREE TIMES A DAY 90 capsule 2   meclizine (ANTIVERT) 12.5 MG tablet TAKE 1 TABLET BY MOUTH 3 TIMES DAILY AS NEEDED FOR DIZZINESS. 90 tablet 3   omeprazole (PRILOSEC) 40 MG capsule Take 1 capsule (40 mg total) by mouth every evening. 30 min before food 90 capsule 3   tiZANidine (ZANAFLEX) 4 MG tablet Take 4 mg by mouth at bedtime.     traMADol (ULTRAM) 50 MG tablet Take by mouth every 6 (six) hours as needed.     traZODone (DESYREL) 50 MG tablet Take 1-1.5 tablets (50-75 mg  total) by mouth at bedtime as needed for sleep. 140 tablet 11   valACYclovir (VALTREX)  1000 MG tablet Take 1 tablet (1,000 mg total) by mouth daily. Suppression. BID X 5 days with outbreak as needed (Patient taking differently: Take 1,000 mg by mouth as needed. Suppression. BID X 5 days with outbreak as needed) 120 tablet 3   No facility-administered medications prior to visit.

## 2022-07-08 NOTE — H&P (View-Only) (Signed)
  Referring Physician:  Walsh, Tanya, MD [address] [place],  [place] [address]  Primary Physician:  Walsh, Tanya, MD  History of Present Illness: 07/12/2022 [name] is here today with a chief complaint of neck pain and left arm weakness.  She has been having neck pain for many years but is been worsening over time.  She has weakness in her left hand. she has been dropping items.  This has been worsening over time.  She is having tingling and numbness in her left hand particular towards her fourth and fifth digit.  Nothing really makes it worse or better.  The good days pain at 4 out of 10.  It can be as bad as 10 out of 10. Bowel/Bladder Dysfunction: none  Conservative measures:  Physical therapy: has not participated Multimodal medical therapy including regular antiinflammatories:  tylenol,gabapentin,methocarbamol, meloxicam, tramadol Injections: has received epidural steroid injections  05/16/2022: Left C5-6 transforaminal ESI (50% relief, Dr. Morales) 09/03/2021: Right C5-6 transforaminal ESI (75% relief) 03/31/2021: Right C5-6 transforaminal ESI (80% relief) 08/19/2020: Left C5-6 transforaminal ESI (90% relief) 05/31/2019: Right C5-6 transforaminal ESI (75% relief) 05/10/2019: Right C5-6 transforaminal ESI (50-60% relief) 06/28/2018: Right C4, C5 and C6 medial branch radiofrequency neurotomy (50% relief) 04/03/2018: Right C4, C5 and C6 medial branch blocks (preinjection pain 9/10 and postinjection pain 2/10) 02/27/2018: Right C4, C5 and C6 medial branch blocks (preinjection pain 9/10 and postinjection pain 2/10.)     Past Surgery:  denies  Leah [name] has some symptoms of cervical myelopathy.  07/01/2022 [name] has a history of asthma, GERD, chemo induced neuropathy, history of breast CA.   Was seen by Dr. Cook in 2019 and he recommended ACDF. She declined and has been seeing PMR.   She has known severe left CTS and notes issues with hand  dexterity, left > right. She is dropping things.   Saw Dr. Menz for her CTS on left and he wanted her to have further evaluation of cervical spine.   She has chronic constant neck pain with left arm pain into her hand that has been worse in last 3 weeks with new left arm weakness. She has numbness in small and ring finger. She is dropping things. She notes some balance issues- feels like she veers to the side with walking. No right arm pain. Some relief with medications.    In review of her chart, she had 5/5 strength in bilateral upper extremities with 5-/5 strength in bilateral triceps when she saw Whitney on 06/07/22.      The symptoms are causing a significant impact on the patient's life.   I have utilized the care everywhere function in epic to review the outside records available from external health systems.  Review of Systems:  A 10 point review of systems is negative, except for the pertinent positives and negatives detailed in the HPI.  Past Medical History: Past Medical History:  Diagnosis Date   Anxiety    Asthma    as a child   Breast cancer (HCC)    Carpal tunnel syndrome    R hand    Colon polyps    Depression    Eosinophilic esophagitis    noted in [place] with GI path report 09/29/14    Esophageal stricture    s/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 Barry Kemler    Family history of breast cancer    Family history of prostate cancer    Gastroesophageal reflux disease   without esophagitis 04/09/2022   Herniated disc, cervical    s/p epidural injection   Herpes    History of kidney stones    Hyperlipidemia    Low back pain    Migraines    Personal history of chemotherapy    Personal history of radiation therapy     Past Surgical History: Past Surgical History:  Procedure Laterality Date   BREAST BIOPSY Left 12/25/2018   US BX, invasive mammary carcinoma    BREAST LUMPECTOMY     BREAST LUMPECTOMY WITH NEEDLE LOCALIZATION AND AXILLARY  SENTINEL LYMPH NODE BX Left 01/16/2019   Procedure: LEFT BREAST LUMPECTOMY WITH NEEDLE LOCALIZATION AND SENTINEL LYMPH NODE BIOPSY;  Surgeon: Toth, Paul III, MD;  Location: [facility];  Service: General;  Laterality: Left;   CESAREAN SECTION     COLONOSCOPY WITH PROPOFOL N/A 03/27/2017   Procedure: COLONOSCOPY WITH PROPOFOL;  Surgeon: Anna, Kiran, MD;  Location: [facility];  Service: Gastroenterology;  Laterality: N/A;   COLONOSCOPY WITH PROPOFOL N/A 04/04/2022   Procedure: COLONOSCOPY WITH PROPOFOL;  Surgeon: Anna, Kiran, MD;  Location: [facility];  Service: Gastroenterology;  Laterality: N/A;   ESOPHAGOGASTRODUODENOSCOPY     ESOPHAGOGASTRODUODENOSCOPY (EGD) WITH PROPOFOL N/A 03/27/2017   Procedure: ESOPHAGOGASTRODUODENOSCOPY (EGD) WITH PROPOFOL WITH DILATION;  Surgeon: Anna, Kiran, MD;  Location: [facility];  Service: Gastroenterology;  Laterality: N/A;   PORTACATH PLACEMENT N/A 02/15/2019   Procedure: INSERTION PORT-A-CATH WITH POSSIBLE ULTRASOUND;  Surgeon: Toth, Paul III, MD;  Location: [facility];  Service: General;  Laterality: N/A;   RE-EXCISION OF BREAST CANCER,SUPERIOR MARGINS Left 02/15/2019   Procedure: RE-EXCISION OF LEFT BREAST CANCER ANTERIOR MARGINS;  Surgeon: Toth, Paul III, MD;  Location: [facility];  Service: General;  Laterality: Left;   THROAT SURGERY  2015   UMBILICAL HERNIA REPAIR  2006    x 2   w mesh per pt    Allergies: Allergies as of 07/12/2022 - Review Complete 07/12/2022  Allergen Reaction Noted   Hydrocodone-acetaminophen Hives 01/24/2019   Lactose Other (See Comments) 01/10/2019   Lactose intolerance (gi) Other (See Comments) 01/10/2019   Penicillins Rash 06/01/2016   Sulfa antibiotics Rash 06/01/2016    Medications:  Current Outpatient Medications:    traMADol (ULTRAM) 50 MG tablet, Take 50 mg by mouth 2 (two) times daily as needed., Disp: , Rfl:    acetaminophen (TYLENOL) 500 MG tablet, Take 500-1,000 mg by mouth every 6 (six) hours as needed (for  pain.)., Disp: , Rfl:    albuterol (PROVENTIL) (2.5 MG/3ML) 0.083% nebulizer solution, Take 3 mLs (2.5 mg total) by nebulization every 6 (six) hours as needed for wheezing or shortness of breath., Disp: 150 mL, Rfl: 1   albuterol (VENTOLIN HFA) 108 (90 Base) MCG/ACT inhaler, Inhale 2 puffs into the lungs every 4 (four) hours as needed for wheezing or shortness of breath., Disp: 54 g, Rfl: 11   anastrozole (ARIMIDEX) 1 MG tablet, Take 1 tablet (1 mg total) by mouth daily., Disp: 90 tablet, Rfl: 2   atorvastatin (LIPITOR) 40 MG tablet, Take 1 tablet (40 mg total) by mouth daily., Disp: 90 tablet, Rfl: 3   budesonide-formoterol (SYMBICORT) 160-4.5 MCG/ACT inhaler, Inhale 2 puffs into the lungs in the morning and at bedtime., Disp: 1 each, Rfl: 12   cetirizine (ZYRTEC) 10 MG tablet, Take 1 tablet (10 mg total) by mouth daily as needed for allergies., Disp: 90 tablet, Rfl: 3   Cholecalciferol (VITAMIN D-3) 125 MCG (5000 UT) TABS, Take 5,000 Units by mouth daily., Disp: ,   Rfl:    Cyanocobalamin (VITAMIN B-12 PO), Take 1 tablet by mouth daily. 1000mg daily, Disp: , Rfl:    DULoxetine (CYMBALTA) 60 MG capsule, TAKE 1 CAPSULE BY MOUTH EVERY DAY, Disp: 90 capsule, Rfl: 1   gabapentin (NEURONTIN) 300 MG capsule, TAKE 1 CAPSULE BY MOUTH THREE TIMES A DAY, Disp: 90 capsule, Rfl: 2   meclizine (ANTIVERT) 12.5 MG tablet, TAKE 1 TABLET BY MOUTH 3 TIMES DAILY AS NEEDED FOR DIZZINESS., Disp: 90 tablet, Rfl: 3   omeprazole (PRILOSEC) 40 MG capsule, Take 1 capsule (40 mg total) by mouth every evening. 30 min before food, Disp: 90 capsule, Rfl: 3   tiZANidine (ZANAFLEX) 4 MG tablet, Take 4 mg by mouth at bedtime., Disp: , Rfl:    traZODone (DESYREL) 50 MG tablet, Take 1-1.5 tablets (50-75 mg total) by mouth at bedtime as needed for sleep., Disp: 140 tablet, Rfl: 11   valACYclovir (VALTREX) 1000 MG tablet, Take 1 tablet (1,000 mg total) by mouth daily. Suppression. BID X 5 days with outbreak as needed (Patient taking  differently: Take 1,000 mg by mouth as needed. Suppression. BID X 5 days with outbreak as needed), Disp: 120 tablet, Rfl: 3  Social History: Social History   Tobacco Use   Smoking status: Former    Packs/day: 2.00    Years: 20.00    Additional pack years: 0.00    Total pack years: 40.00    Types: Cigarettes    Quit date: 06/02/2006    Years since quitting: 16.1   Smokeless tobacco: Never  Vaping Use   Vaping Use: Never used  Substance Use Topics   Alcohol use: Yes   Drug use: No    Family Medical History: Family History  Problem Relation Age of Onset   Breast cancer Mother        dx late 50s   Thyroid disease Mother    Colon polyps Mother    Prostate cancer Father        dx 55   Breast cancer Maternal Grandmother        dx late 50s   Osteoporosis Maternal Grandmother    Cancer Maternal Uncle        possibly, unsure type    Physical Examination: Vitals:   07/12/22 0952  BP: (!) 165/94  Pulse: 82    General: Patient is well developed, well nourished, calm, collected, and in no apparent distress. Attention to examination is appropriate.  Neck:   Diminished extension  Respiratory: Patient is breathing without any difficulty.   NEUROLOGICAL:     Awake, alert, oriented to person, place, and time.  Speech is clear and fluent.   Cranial Nerves: Pupils equal round and reactive to light.  Facial tone is symmetric.  Facial sensation is symmetric. Shoulder shrug is symmetric. Tongue protrusion is midline.  There is no pronator drift.  ROM of spine: full.    Strength: Side Biceps Triceps Deltoid Interossei Grip Wrist Ext. Wrist Flex.  R 5 5 5 5 5 5 5  L 5 4+ 5 3 3 5 5   Side Iliopsoas Quads Hamstring PF DF EHL  R 5 5 5 5 5 5  L 5 5 5 5 5 5   Finger extension is 3 out of 5 on the left.  Hypothenar function is severely diminished.  Reflexes are 2+ and symmetric at the biceps, triceps, brachioradialis, patella and achilles.   Hoffman's is present on the left.    Bilateral upper and lower extremity sensation   is intact to light touch with exception of left C8 distribution which is diminished.    No evidence of dysmetria noted.  Gait is normal.     Medical Decision Making  Imaging: CT C spine 07/07/2022 IMPRESSION: 1. Advanced degenerative change of the cervical spine as above with disc degeneration most advanced at C5-C6 and C6-C7, and asymmetric right-sided facet arthropathy most advanced at C2-C3 through C4-C5. 2. Multilevel neural foraminal stenosis as detailed above. No greater than mild spinal canal stenosis at C5-C6.     Electronically Signed   By: Peter  Noone M.D.   On: 07/12/2022 08:55  MRI C spine 06/30/2022 IMPRESSION: Reversal of the normal cervical lordosis with facet mediated anterolisthesis C3 on C4 and C4 on C5.   Marked flattening of the ventral cord at C6-7 where there is also severe bilateral foraminal narrowing.   Mild to moderate flattening of the ventral cord at C5-6 where moderate to moderately severe foraminal narrowing is worse the right.   Moderate to marked flattening of the ventral cord at C7-T1 where moderate to moderately severe foraminal narrowing is worse on the left.   Disc effaces the ventral thecal sac at C3-4. Moderately severe to severe foraminal narrowing at C3-4 is worse on the right.   Multilevel facet arthropathy appears worst on the right at C3-4 and C4-5.     Electronically Signed   By: Thomas  Dalessio M.D.   On: 06/30/2022 10:25    C spine flexion and extension 07/07/2022 Formal read pending.  Significant kyphosis centered at C5-7.      I have personally reviewed the images and agree with the above interpretation.  Assessment and Plan: Leah [name] is a pleasant 56 y.o. female with cervical myelopathy and radiculopathy causing left upper extremity weakness.  She has a cervical kyphosis centered about C5 and C6.  This is worsened over time in comparison to her x-rays from  2019.  She now has a substantial amount of kyphosis and has severe compression of the left C8 nerve root which is causing her left arm weakness.  She also has anterolisthesis of C3 on C4 and C4 on C5.  There is substantial cervical spondylosis noted on her x-rays and CT scan.  Given her objective weakness, no conservative management is indicated at this time.  I have recommended surgical intervention.  Because of the nature of her radiographic abnormalities, I rather extensive surgery is necessary to correct her stenosis as well as reestablish her normal cervical lordosis.  I recommended a C5-T1 anterior cervical discectomy and fusion followed by C2-T2 posterior instrumentation to help correct her deformity.  We will not be able to use bone morphogenetic protein as she has prior history of cancer.  A smaller operation would not be indicated.  She has significant anterolisthesis of C3 on C4 and C4 on C5 which is worsened over the past several years.  Leaving those levels without intervention would put her at very high risk of reoperation.  I discussed the planned procedure at length with the patient, including the risks, benefits, alternatives, and indications. The risks discussed include but are not limited to bleeding, infection, need for reoperation, spinal fluid leak, stroke, vision loss, anesthetic complication, coma, paralysis, and even death. I also described in detail that improvement was not guaranteed.  The patient expressed understanding of these risks, and asked that we proceed with surgery. I described the surgery in layman's terms, and gave ample opportunity for questions, which were answered to the best of   my ability.  I spent a total of 45 minutes in this patient's care today. This time was spent reviewing pertinent records including imaging studies, obtaining and confirming history, performing a directed evaluation, formulating and discussing my recommendations, and documenting the  visit within the medical record.     Thank you for involving me in the care of this patient.      Pavle Wiler K. Caison Hearn MD, MPHS Neurosurgery   

## 2022-07-12 ENCOUNTER — Other Ambulatory Visit: Payer: Self-pay

## 2022-07-12 ENCOUNTER — Ambulatory Visit (INDEPENDENT_AMBULATORY_CARE_PROVIDER_SITE_OTHER): Payer: Medicaid Other | Admitting: Neurosurgery

## 2022-07-12 ENCOUNTER — Encounter: Payer: Self-pay | Admitting: Neurosurgery

## 2022-07-12 VITALS — BP 163/98 | HR 91 | Ht 63.0 in | Wt 186.2 lb

## 2022-07-12 DIAGNOSIS — M4312 Spondylolisthesis, cervical region: Secondary | ICD-10-CM

## 2022-07-12 DIAGNOSIS — G959 Disease of spinal cord, unspecified: Secondary | ICD-10-CM

## 2022-07-12 DIAGNOSIS — M4802 Spinal stenosis, cervical region: Secondary | ICD-10-CM

## 2022-07-12 DIAGNOSIS — M4012 Other secondary kyphosis, cervical region: Secondary | ICD-10-CM

## 2022-07-12 DIAGNOSIS — M5412 Radiculopathy, cervical region: Secondary | ICD-10-CM | POA: Diagnosis not present

## 2022-07-12 DIAGNOSIS — Z01818 Encounter for other preprocedural examination: Secondary | ICD-10-CM

## 2022-07-12 NOTE — Patient Instructions (Signed)
Please see below for information in regards to your upcoming surgery:   Planned surgery: C5-T1 anterior cervical discectomy and fusion, C2-T2 posterior spinal fusion   Surgery date: 07/29/22 - you will find out your arrival time the business day before your surgery.   Pre-op appointment at [facility] Pre-admit Testing: we will call you with a date/time for this. Pre-admit testing is located on the first floor of the [facility] building, [address] [facility] [address], [address]. Please bring all prescriptions in the original prescription bottles to your appointment, even if you have reviewed medications by phone with a pharmacy representative. Pre-op labs may be done at your pre-op appointment. You are not required to fast for these labs. Should you need to change your pre-op appointment, please call Pre-admit testing at [phone].    Surgical clearance: we will send a clearance form to Dr Clent Ridges      Brace: [facility] will contact you regarding an appointment for the brace you will use after surgery. Their number is [phone] should you miss their call or have an issue with your brace after surgery. You will need to bring the brace to the hospital on the day of surgery.    NSAIDS (Non-steroidal anti-inflammatory drugs): because you are having a fusion, no NSAIDS (such as ibuprofen, aleve, naproxen, meloxicam, diclofenac) for 3 months after surgery. Celebrex is an exception. Tylenol is ok because it is not an NSAID.   Because you are having a fusion or arthroplasty: for appointments after your 2 week follow-up: please arrive at the [facility] outpatient imaging center ([address], [place]) or [employer] [facility] one hour prior to your appointment for x-rays. This applies to every appointment after your 2 week follow-up. Failure to do so may result in your appointment being rescheduled.   If you have FMLA/disability paperwork, please drop it off  or fax it to [phone], attention [place]ty.   We can be reached by phone or mychart 8am-4pm, Monday-Friday. If you have any questions/concerns before or after surgery, you can reach [place] at [phone], or you can send a mychart message. If you have a concern after hours that cannot wait until normal business hours, you can call [phone] and ask to page the neurosurgeon on call for [place].     Appointments/FMLA & disability paperwork: Patty & Cristin  Nurse: Royston Cowper  Medical assistants: Laurann Montana Physician Assistant's: Manning Charity & Drake Leach Surgeon: Venetia Night, MD

## 2022-07-13 ENCOUNTER — Encounter: Payer: Self-pay | Admitting: Neurosurgery

## 2022-07-13 ENCOUNTER — Other Ambulatory Visit: Payer: Self-pay

## 2022-07-13 ENCOUNTER — Telehealth: Payer: Self-pay

## 2022-07-13 DIAGNOSIS — J4540 Moderate persistent asthma, uncomplicated: Secondary | ICD-10-CM

## 2022-07-13 MED ORDER — BUDESONIDE-FORMOTEROL FUMARATE 160-4.5 MCG/ACT IN AERO
2.0000 | INHALATION_SPRAY | Freq: Two times a day (BID) | RESPIRATORY_TRACT | 3 refills | Status: DC
Start: 2022-07-13 — End: 2022-07-13

## 2022-07-13 MED ORDER — BUDESONIDE-FORMOTEROL FUMARATE 160-4.5 MCG/ACT IN AERO: 2.0000 | INHALATION_SPRAY | Freq: Two times a day (BID) | RESPIRATORY_TRACT | 12 refills | Status: DC

## 2022-07-13 NOTE — Addendum Note (Signed)
Addended byRaechel Chute on: 07/13/2022 04:20 PM   Modules accepted: Orders

## 2022-07-13 NOTE — Telephone Encounter (Signed)
Dr. Dana Allan is out of the office today, so I did not leave a physical copy in the office area for her CMA.

## 2022-07-13 NOTE — Telephone Encounter (Signed)
We received a surgical clearance for patient from [facility] Neurosurgery at [facility].  I did send a copy of the clearance to Dr. Astrid Divine folder on S drive.

## 2022-07-13 NOTE — Telephone Encounter (Addendum)
Printed & placed in [place] "sign & fax" folder  Surgery date: 07/29/22

## 2022-07-18 ENCOUNTER — Inpatient Hospital Stay: Admission: RE | Admit: 2022-07-18 | Payer: Medicaid Other | Source: Ambulatory Visit

## 2022-07-19 ENCOUNTER — Encounter
Admission: RE | Admit: 2022-07-19 | Discharge: 2022-07-19 | Disposition: A | Discharge status: Home / Self Care | Payer: Medicaid Other | Source: Ambulatory Visit | Attending: Neurosurgery | Admitting: Neurosurgery

## 2022-07-19 ENCOUNTER — Other Ambulatory Visit: Payer: Medicaid Other

## 2022-07-19 ENCOUNTER — Other Ambulatory Visit: Payer: Self-pay

## 2022-07-19 VITALS — BP 136/83 | HR 79 | Resp 16 | Ht 63.0 in | Wt 183.4 lb

## 2022-07-19 DIAGNOSIS — Z01812 Encounter for preprocedural laboratory examination: Secondary | ICD-10-CM | POA: Diagnosis not present

## 2022-07-19 HISTORY — DX: Dyspnea, unspecified: R06.00

## 2022-07-19 HISTORY — DX: Pneumonia, unspecified organism: J18.9

## 2022-07-19 HISTORY — DX: Unspecified osteoarthritis, unspecified site: M19.90

## 2022-07-19 LAB — TYPE AND SCREEN
ABO/RH(D): A POS
Antibody Screen: NEGATIVE

## 2022-07-19 LAB — SURGICAL PCR SCREEN
MRSA, PCR: NEGATIVE
Staphylococcus aureus: NEGATIVE

## 2022-07-19 NOTE — Patient Instructions (Addendum)
Your procedure is scheduled on: 07/29/22 - Friday Report to the Registration Desk on the 1st floor of the [facility]. To find out your arrival time, please call [phone] between 1PM - 3PM on: 07/28/22 - Thursday If your arrival time is 6:00 am, do not arrive before that time as the [facility] entrance doors do not open until 6:00 am.  REMEMBER: Instructions that are not followed completely may result in serious medical risk, up to and including death; or upon the discretion of your surgeon and anesthesiologist your surgery may need to be rescheduled.  Do not eat food after midnight the night before surgery.  No gum chewing or hard candies.  You may however, drink CLEAR liquids up to 2 hours before you are scheduled to arrive for your surgery. Do not drink anything within 2 hours of your scheduled arrival time.  Clear liquids include: - water  - apple juice without pulp - gatorade (not RED colors) - black coffee or tea (Do NOT add milk or creamers to the coffee or tea) Do NOT drink anything that is not on this list.   One week prior to surgery: Stop Anti-inflammatories (NSAIDS) such as Advil, Aleve, Ibuprofen, Motrin, Naproxen, Naprosyn and Aspirin based products such as Excedrin, Goody's Powder, BC Powder.  Stop ANY OVER THE COUNTER supplements until after surgery.  You may however, continue to take Tylenol if needed for pain up until the day of surgery.   TAKE ONLY THESE MEDICATIONS THE MORNING OF SURGERY WITH A SIP OF WATER:  omeprazole (PRILOSEC) - (take one the night before and one on the morning of surgery - helps to prevent nausea after surgery.) gabapentin (NEURONTIN)  budesonide-formoterol (SYMBICORT)  anastrozole (ARIMIDEX)  atorvastatin (LIPITOR)   Use albuterol (PROVENTIL)  on the day of surgery and bring to the hospital.  No Alcohol for 24 hours before or after surgery.  No Smoking including e-cigarettes for 24 hours before surgery.  No chewable tobacco  products for at least 6 hours before surgery.  No nicotine patches on the day of surgery.  Do not use any "recreational" drugs for at least a week (preferably 2 weeks) before your surgery.  Please be advised that the combination of cocaine and anesthesia may have negative outcomes, up to and including death. If you test positive for cocaine, your surgery will be cancelled.  On the morning of surgery brush your teeth with toothpaste and water, you may rinse your mouth with mouthwash if you wish. Do not swallow any toothpaste or mouthwash.  Use CHG Soap or wipes as directed on instruction sheet.  Do not wear jewelry, make-up, hairpins, clips or nail polish.  Do not wear lotions, powders, or perfumes.   Do not shave body hair from the neck down 48 hours before surgery.  Contact lenses, hearing aids and dentures may not be worn into surgery.  Do not bring valuables to the hospital. [facility] is not responsible for any missing/lost belongings or valuables.   Notify your doctor if there is any change in your medical condition (cold, fever, infection).  Wear comfortable clothing (specific to your surgery type) to the hospital.  After surgery, you can help prevent lung complications by doing breathing exercises.  Take deep breaths and cough every 1-2 hours. Your doctor may order a device called an Incentive Spirometer to help you take deep breaths. When coughing or sneezing, hold a pillow firmly against your incision with both hands. This is called "splinting." Doing this helps  protect your incision. It also decreases belly discomfort.  If you are being admitted to the hospital overnight, leave your suitcase in the car. After surgery it may be brought to your room.  In case of increased patient census, it may be necessary for you, the patient, to continue your postoperative care in the Same Day Surgery department.  If you are being discharged the day of surgery, you will not be allowed to  drive home. You will need a responsible individual to drive you home and stay with you for 24 hours after surgery.   If you are taking public transportation, you will need to have a responsible individual with you.  Please call the Pre-admissions Testing Dept. at [phone] if you have any questions about these instructions.  Surgery Visitation Policy:  Patients having surgery or a procedure may have two visitors.  Children under the age of 44 must have an adult with them who is not the patient.  Inpatient Visitation:    Visiting hours are 7 a.m. to 8 p.m. Up to four visitors are allowed at one time in a patient room. The visitors may rotate out with other people during the day.  One visitor age 28 or older may stay with the patient overnight and must be in the room by 8 p.m.

## 2022-07-20 ENCOUNTER — Ambulatory Visit (INDEPENDENT_AMBULATORY_CARE_PROVIDER_SITE_OTHER): Payer: Medicaid Other | Admitting: Family Medicine

## 2022-07-20 ENCOUNTER — Encounter: Payer: Self-pay | Admitting: Family Medicine

## 2022-07-20 VITALS — BP 124/78 | HR 92 | Ht 63.0 in | Wt 183.0 lb

## 2022-07-20 DIAGNOSIS — J0101 Acute recurrent maxillary sinusitis: Secondary | ICD-10-CM

## 2022-07-20 DIAGNOSIS — M5412 Radiculopathy, cervical region: Secondary | ICD-10-CM

## 2022-07-20 NOTE — Patient Instructions (Signed)
It was a pleasure meeting you today. Thank you for allowing me to take part in your health care.  Our goals for today as we discussed include:  Will fax forms for your surgery today  If you have any questions or concerns, please do not hesitate to call the office at [phone].  I look forward to our next visit and until then take care and stay safe.  Regards,   Dana Allan, MD   [facility]

## 2022-07-20 NOTE — Progress Notes (Signed)
   SUBJECTIVE:   Chief Complaint  Patient presents with   Pre-op Exam    C5-T1 ANTERIOR CERVICAL DISCECTOMY AND FUSION  C2-T2 POSTERIOR SPINAL FUSION  Scheduled for 07/29/22   HPI Presents to clinic for preop forms to be completed. Patient having C5-T1 anterior cervical discectomy and fusion, C2-T2 posterior spinal fusion.  Undergoing general anesthesia.  Denies any history of prolonged intubation, respiratory failure or ICU admissions postoperatively.  Denies any complications with general anesthesia.  Has history of COPD and well-controlled on current inhalers daily.  PERTINENT PMH / PSH: COPD Asthma CAD Prediabetes   OBJECTIVE:  BP 124/78   Pulse 92   Ht 5\' 3"  (1.6 m)   Wt 183 lb (83 kg)   SpO2 97%   BMI 32.42 kg/m    Physical Exam Vitals reviewed.  Constitutional:      General: She is not in acute distress.    Appearance: Normal appearance. She is obese. She is not ill-appearing, toxic-appearing or diaphoretic.  Eyes:     General:        Right eye: No discharge.        Left eye: No discharge.     Conjunctiva/sclera: Conjunctivae normal.  Cardiovascular:     Rate and Rhythm: Normal rate and regular rhythm.     Heart sounds: Normal heart sounds.  Pulmonary:     Effort: Pulmonary effort is normal. No respiratory distress.     Breath sounds: Normal breath sounds. No wheezing or rhonchi.  Neurological:     Mental Status: She is alert and oriented to person, place, and time. Mental status is at baseline.  Psychiatric:        Mood and Affect: Mood normal.        Behavior: Behavior normal.        Thought Content: Thought content normal.        Judgment: Judgment normal.     ASSESSMENT/PLAN:  Cervical radiculopathy Assessment & Plan: Chronic.  Plan for C5-T1 anterior cervical discectomy and fusion, C2-T2 posterior spinal fusion under general anesthesia. No previous respiratory complications No previous long-term intubations No previous complications with  general anesthesia GUPTA risk scores for Pneumonia, Respiratory Arrest and MICA 0.4% risk of postoperative pneumonia 0.3% risk of mechanical ventilation greater than 48 hours postsurgery or unplanned intubation less than 30 days of surgery 0.1% risk of myocardial infarction or cardiac arrest intraoperatively or or up to 30 days postop Forms completed and faxed to neurosurgery office.     PDMP reviewed  Return in about 9 weeks (around 09/21/2022).  Dana Allan, MD

## 2022-07-21 MED ORDER — CETIRIZINE HCL 10 MG PO TABS
10.0000 mg | ORAL_TABLET | Freq: Every day | ORAL | 3 refills | Status: DC | PRN
Start: 2022-07-21 — End: 2022-09-29

## 2022-07-21 NOTE — Telephone Encounter (Signed)
Rx sent to pharmacy   

## 2022-07-25 ENCOUNTER — Telehealth: Payer: Self-pay | Admitting: Neurosurgery

## 2022-07-25 NOTE — Telephone Encounter (Signed)
Pt called today and left a vm, she says she called [facility] about the neck brace she was told she would need to have on hand before sx sched 05/17 and [facility] has no order/referral. Can we please send one over

## 2022-07-25 NOTE — Telephone Encounter (Signed)
[facility] called back they did receive the order and will try to get her in tomorrow.

## 2022-07-25 NOTE — Telephone Encounter (Signed)
I am glad she called to notify us. I sent it on 07/12/22. I just sent another copy. Can you contact [facility] in [place] to confirm they have received it this time [phone])? Please notify the patient once you confirm [facility] has received it. Thank you.

## 2022-07-25 NOTE — Assessment & Plan Note (Signed)
Chronic.  Plan for C5-T1 anterior cervical discectomy and fusion, C2-T2 posterior spinal fusion under general anesthesia. No previous respiratory complications No previous long-term intubations No previous complications with general anesthesia GUPTA risk scores for Pneumonia, Respiratory Arrest and MICA 0.4% risk of postoperative pneumonia 0.3% risk of mechanical ventilation greater than 48 hours postsurgery or unplanned intubation less than 30 days of surgery 0.1% risk of myocardial infarction or cardiac arrest intraoperatively or or up to 30 days postop Forms completed and faxed to neurosurgery office.

## 2022-07-25 NOTE — Telephone Encounter (Signed)
No one answered, left message on voice mail to call the office.

## 2022-07-27 ENCOUNTER — Other Ambulatory Visit: Payer: Self-pay | Admitting: Oncology

## 2022-07-28 DIAGNOSIS — M4322 Fusion of spine, cervical region: Secondary | ICD-10-CM | POA: Diagnosis not present

## 2022-07-29 ENCOUNTER — Encounter: Payer: Self-pay | Admitting: Neurosurgery

## 2022-07-29 ENCOUNTER — Other Ambulatory Visit: Payer: Self-pay

## 2022-07-29 ENCOUNTER — Inpatient Hospital Stay: Payer: Medicaid Other | Admitting: Anesthesiology

## 2022-07-29 ENCOUNTER — Inpatient Hospital Stay: Payer: Medicaid Other | Admitting: Urgent Care

## 2022-07-29 ENCOUNTER — Inpatient Hospital Stay
Admission: RE | Admit: 2022-07-29 | Discharge: 2022-08-02 | DRG: 455 | Disposition: A | Discharge status: Home / Self Care | Payer: Medicaid Other | Source: Ambulatory Visit | Attending: Neurosurgery | Admitting: Neurosurgery

## 2022-07-29 ENCOUNTER — Encounter
Admission: RE | Disposition: A | Discharge status: Home / Self Care | Payer: Self-pay | Source: Ambulatory Visit | Attending: Neurosurgery

## 2022-07-29 ENCOUNTER — Inpatient Hospital Stay: Payer: Medicaid Other

## 2022-07-29 DIAGNOSIS — Z853 Personal history of malignant neoplasm of breast: Secondary | ICD-10-CM

## 2022-07-29 DIAGNOSIS — Z923 Personal history of irradiation: Secondary | ICD-10-CM

## 2022-07-29 DIAGNOSIS — Z880 Allergy status to penicillin: Secondary | ICD-10-CM

## 2022-07-29 DIAGNOSIS — F32A Depression, unspecified: Secondary | ICD-10-CM | POA: Diagnosis not present

## 2022-07-29 DIAGNOSIS — E785 Hyperlipidemia, unspecified: Secondary | ICD-10-CM | POA: Diagnosis not present

## 2022-07-29 DIAGNOSIS — Z7951 Long term (current) use of inhaled steroids: Secondary | ICD-10-CM

## 2022-07-29 DIAGNOSIS — M5412 Radiculopathy, cervical region: Secondary | ICD-10-CM

## 2022-07-29 DIAGNOSIS — M4722 Other spondylosis with radiculopathy, cervical region: Secondary | ICD-10-CM | POA: Diagnosis not present

## 2022-07-29 DIAGNOSIS — Z9221 Personal history of antineoplastic chemotherapy: Secondary | ICD-10-CM | POA: Diagnosis not present

## 2022-07-29 DIAGNOSIS — G959 Disease of spinal cord, unspecified: Secondary | ICD-10-CM | POA: Diagnosis not present

## 2022-07-29 DIAGNOSIS — Z885 Allergy status to narcotic agent status: Secondary | ICD-10-CM | POA: Diagnosis not present

## 2022-07-29 DIAGNOSIS — Z79899 Other long term (current) drug therapy: Secondary | ICD-10-CM

## 2022-07-29 DIAGNOSIS — M4312 Spondylolisthesis, cervical region: Secondary | ICD-10-CM

## 2022-07-29 DIAGNOSIS — K219 Gastro-esophageal reflux disease without esophagitis: Secondary | ICD-10-CM | POA: Diagnosis not present

## 2022-07-29 DIAGNOSIS — Z8349 Family history of other endocrine, nutritional and metabolic diseases: Secondary | ICD-10-CM | POA: Diagnosis not present

## 2022-07-29 DIAGNOSIS — Z79811 Long term (current) use of aromatase inhibitors: Secondary | ICD-10-CM

## 2022-07-29 DIAGNOSIS — M4012 Other secondary kyphosis, cervical region: Secondary | ICD-10-CM | POA: Diagnosis not present

## 2022-07-29 DIAGNOSIS — Z01818 Encounter for other preprocedural examination: Secondary | ICD-10-CM

## 2022-07-29 DIAGNOSIS — M4802 Spinal stenosis, cervical region: Secondary | ICD-10-CM | POA: Diagnosis not present

## 2022-07-29 DIAGNOSIS — Z981 Arthrodesis status: Secondary | ICD-10-CM | POA: Diagnosis not present

## 2022-07-29 DIAGNOSIS — Z87891 Personal history of nicotine dependence: Secondary | ICD-10-CM | POA: Diagnosis not present

## 2022-07-29 DIAGNOSIS — M4712 Other spondylosis with myelopathy, cervical region: Principal | ICD-10-CM | POA: Diagnosis present

## 2022-07-29 DIAGNOSIS — I10 Essential (primary) hypertension: Secondary | ICD-10-CM | POA: Diagnosis not present

## 2022-07-29 DIAGNOSIS — Z888 Allergy status to other drugs, medicaments and biological substances status: Secondary | ICD-10-CM

## 2022-07-29 DIAGNOSIS — Z882 Allergy status to sulfonamides status: Secondary | ICD-10-CM

## 2022-07-29 DIAGNOSIS — F419 Anxiety disorder, unspecified: Secondary | ICD-10-CM | POA: Diagnosis present

## 2022-07-29 HISTORY — PX: APPLICATION OF INTRAOPERATIVE CT SCAN: SHX6668

## 2022-07-29 HISTORY — PX: POSTERIOR CERVICAL FUSION/FORAMINOTOMY: SHX5038

## 2022-07-29 HISTORY — PX: ANTERIOR CERVICAL DECOMP/DISCECTOMY FUSION: SHX1161

## 2022-07-29 LAB — ABO/RH: ABO/RH(D): A POS

## 2022-07-29 SURGERY — ANTERIOR CERVICAL DECOMPRESSION/DISCECTOMY FUSION 3 LEVELS
Anesthesia: General

## 2022-07-29 MED ORDER — SENNA 8.6 MG PO TABS
1.0000 | ORAL_TABLET | Freq: Two times a day (BID) | ORAL | Status: DC
  Administered 2022-07-29 – 2022-08-02 (×8): 8.6 mg via ORAL
  Filled 2022-07-29 (×8): qty 1

## 2022-07-29 MED ORDER — PROPOFOL 1000 MG/100ML IV EMUL
INTRAVENOUS | Status: AC
  Filled 2022-07-29: qty 100

## 2022-07-29 MED ORDER — 0.9 % SODIUM CHLORIDE (POUR BTL) OPTIME
TOPICAL | Status: DC | PRN
  Administered 2022-07-29: 1000 mL

## 2022-07-29 MED ORDER — ACETAMINOPHEN 10 MG/ML IV SOLN
INTRAVENOUS | Status: AC
  Filled 2022-07-29: qty 100

## 2022-07-29 MED ORDER — FENTANYL CITRATE (PF) 100 MCG/2ML IJ SOLN
INTRAMUSCULAR | Status: AC
  Filled 2022-07-29: qty 2

## 2022-07-29 MED ORDER — SODIUM CHLORIDE 0.9% FLUSH
3.0000 mL | Freq: Two times a day (BID) | INTRAVENOUS | Status: DC
  Administered 2022-07-29 – 2022-07-31 (×4): 3 mL via INTRAVENOUS

## 2022-07-29 MED ORDER — REMIFENTANIL HCL 1 MG IV SOLR
INTRAVENOUS | Status: AC
  Filled 2022-07-29: qty 1000

## 2022-07-29 MED ORDER — CEFAZOLIN SODIUM-DEXTROSE 2-4 GM/100ML-% IV SOLN
INTRAVENOUS | Status: AC
  Filled 2022-07-29: qty 100

## 2022-07-29 MED ORDER — FLUTICASONE FUROATE-VILANTEROL 200-25 MCG/ACT IN AEPB
1.0000 | INHALATION_SPRAY | Freq: Every day | RESPIRATORY_TRACT | Status: DC
  Administered 2022-07-30 – 2022-08-02 (×4): 1 via RESPIRATORY_TRACT
  Filled 2022-07-29 (×2): qty 28

## 2022-07-29 MED ORDER — CHLORHEXIDINE GLUCONATE 0.12 % MT SOLN
OROMUCOSAL | Status: AC
  Filled 2022-07-29: qty 15

## 2022-07-29 MED ORDER — ONDANSETRON HCL 4 MG PO TABS
4.0000 mg | ORAL_TABLET | Freq: Four times a day (QID) | ORAL | Status: DC | PRN
  Administered 2022-07-30: 4 mg via ORAL
  Filled 2022-07-29: qty 1

## 2022-07-29 MED ORDER — HYDROMORPHONE HCL 1 MG/ML IJ SOLN
INTRAMUSCULAR | Status: AC
  Filled 2022-07-29: qty 1

## 2022-07-29 MED ORDER — REMIFENTANIL HCL 1 MG IV SOLR
INTRAVENOUS | Status: DC | PRN
  Administered 2022-07-29: .1 ug/kg/min via INTRAVENOUS

## 2022-07-29 MED ORDER — OXYCODONE HCL 5 MG PO TABS
5.0000 mg | ORAL_TABLET | ORAL | Status: DC | PRN
  Administered 2022-07-31: 5 mg via ORAL
  Filled 2022-07-29: qty 1

## 2022-07-29 MED ORDER — HYDROMORPHONE HCL 1 MG/ML IJ SOLN
INTRAMUSCULAR | Status: DC | PRN
  Administered 2022-07-29 (×2): 1 mg via INTRAVENOUS

## 2022-07-29 MED ORDER — EPINEPHRINE PF 1 MG/ML IJ SOLN
INTRAMUSCULAR | Status: AC
  Filled 2022-07-29: qty 1

## 2022-07-29 MED ORDER — FENTANYL CITRATE (PF) 100 MCG/2ML IJ SOLN
25.0000 ug | INTRAMUSCULAR | Status: DC | PRN
  Administered 2022-07-29 (×4): 25 ug via INTRAVENOUS

## 2022-07-29 MED ORDER — SUCCINYLCHOLINE CHLORIDE 200 MG/10ML IV SOSY
PREFILLED_SYRINGE | INTRAVENOUS | Status: DC | PRN
  Administered 2022-07-29: 120 mg via INTRAVENOUS

## 2022-07-29 MED ORDER — BISACODYL 10 MG RE SUPP: 10.0000 mg | Freq: Every day | RECTAL | Status: DC | PRN

## 2022-07-29 MED ORDER — MAGNESIUM CITRATE PO SOLN: 1.0000 | Freq: Once | ORAL | Status: DC | PRN

## 2022-07-29 MED ORDER — PROPOFOL 500 MG/50ML IV EMUL
INTRAVENOUS | Status: DC | PRN
  Administered 2022-07-29: 165 ug/kg/min via INTRAVENOUS

## 2022-07-29 MED ORDER — MENTHOL 3 MG MT LOZG: 1.0000 | LOZENGE | OROMUCOSAL | Status: DC | PRN

## 2022-07-29 MED ORDER — CHLORHEXIDINE GLUCONATE 0.12 % MT SOLN
15.0000 mL | Freq: Once | OROMUCOSAL | Status: AC
  Administered 2022-07-29: 15 mL via OROMUCOSAL

## 2022-07-29 MED ORDER — ONDANSETRON HCL 4 MG/2ML IJ SOLN: 4.0000 mg | Freq: Four times a day (QID) | INTRAMUSCULAR | Status: DC | PRN

## 2022-07-29 MED ORDER — HYDROMORPHONE HCL 1 MG/ML IJ SOLN
0.5000 mg | INTRAMUSCULAR | Status: DC | PRN
  Administered 2022-07-30: 0.5 mg via INTRAVENOUS
  Filled 2022-07-29: qty 1

## 2022-07-29 MED ORDER — ONDANSETRON HCL 4 MG/2ML IJ SOLN
INTRAMUSCULAR | Status: DC | PRN
  Administered 2022-07-29 (×2): 4 mg via INTRAVENOUS

## 2022-07-29 MED ORDER — ENOXAPARIN SODIUM 40 MG/0.4ML IJ SOSY
40.0000 mg | PREFILLED_SYRINGE | INTRAMUSCULAR | Status: DC
  Administered 2022-07-30 – 2022-08-02 (×4): 40 mg via SUBCUTANEOUS
  Filled 2022-07-29 (×4): qty 0.4

## 2022-07-29 MED ORDER — BUPIVACAINE HCL (PF) 0.5 % IJ SOLN
INTRAMUSCULAR | Status: AC
  Filled 2022-07-29: qty 60

## 2022-07-29 MED ORDER — TRAZODONE HCL 50 MG PO TABS
50.0000 mg | ORAL_TABLET | Freq: Every evening | ORAL | Status: DC | PRN
  Administered 2022-08-01: 50 mg via ORAL
  Filled 2022-07-29: qty 1

## 2022-07-29 MED ORDER — ANASTROZOLE 1 MG PO TABS
1.0000 mg | ORAL_TABLET | Freq: Every day | ORAL | Status: DC
  Administered 2022-07-30 – 2022-08-02 (×4): 1 mg via ORAL
  Filled 2022-07-29 (×4): qty 1

## 2022-07-29 MED ORDER — LIDOCAINE HCL (CARDIAC) PF 100 MG/5ML IV SOSY
PREFILLED_SYRINGE | INTRAVENOUS | Status: DC | PRN
  Administered 2022-07-29: 100 mg via INTRAVENOUS

## 2022-07-29 MED ORDER — KETOROLAC TROMETHAMINE 15 MG/ML IJ SOLN
15.0000 mg | Freq: Four times a day (QID) | INTRAMUSCULAR | Status: DC
  Administered 2022-07-29 – 2022-07-30 (×3): 15 mg via INTRAVENOUS
  Filled 2022-07-29 (×2): qty 1

## 2022-07-29 MED ORDER — SURGIRINSE WOUND IRRIGATION SYSTEM - OPTIME: TOPICAL | Status: DC | PRN

## 2022-07-29 MED ORDER — LORATADINE 10 MG PO TABS
10.0000 mg | ORAL_TABLET | Freq: Every day | ORAL | Status: DC
  Administered 2022-07-29 – 2022-08-02 (×5): 10 mg via ORAL
  Filled 2022-07-29 (×5): qty 1

## 2022-07-29 MED ORDER — EPHEDRINE SULFATE (PRESSORS) 50 MG/ML IJ SOLN
INTRAMUSCULAR | Status: DC | PRN
  Administered 2022-07-29: 5 mg via INTRAVENOUS
  Administered 2022-07-29: 10 mg via INTRAVENOUS

## 2022-07-29 MED ORDER — DEXMEDETOMIDINE HCL IN NACL 80 MCG/20ML IV SOLN
INTRAVENOUS | Status: DC | PRN
  Administered 2022-07-29 (×2): 8 ug via INTRAVENOUS

## 2022-07-29 MED ORDER — DIPHENHYDRAMINE HCL 50 MG/ML IJ SOLN
INTRAMUSCULAR | Status: AC
  Filled 2022-07-29: qty 1

## 2022-07-29 MED ORDER — BUPIVACAINE-EPINEPHRINE (PF) 0.5% -1:200000 IJ SOLN
INTRAMUSCULAR | Status: DC | PRN
  Administered 2022-07-29: 6 mL
  Administered 2022-07-29: 10 mL

## 2022-07-29 MED ORDER — DIAZEPAM 5 MG PO TABS: 5.0000 mg | ORAL_TABLET | Freq: Three times a day (TID) | ORAL | Status: DC | PRN

## 2022-07-29 MED ORDER — SURGIFLO WITH THROMBIN (HEMOSTATIC MATRIX KIT) OPTIME
TOPICAL | Status: DC | PRN
  Administered 2022-07-29: 1 via TOPICAL

## 2022-07-29 MED ORDER — DULOXETINE HCL 30 MG PO CPEP
60.0000 mg | ORAL_CAPSULE | Freq: Every day | ORAL | Status: DC
  Administered 2022-07-29 – 2022-08-02 (×5): 60 mg via ORAL
  Filled 2022-07-29 (×6): qty 2

## 2022-07-29 MED ORDER — ACETAMINOPHEN 500 MG PO TABS
1000.0000 mg | ORAL_TABLET | Freq: Four times a day (QID) | ORAL | Status: AC
  Administered 2022-07-30 (×4): 1000 mg via ORAL
  Filled 2022-07-29 (×4): qty 2

## 2022-07-29 MED ORDER — POLYETHYLENE GLYCOL 3350 17 G PO PACK: 17.0000 g | PACK | Freq: Every day | ORAL | Status: DC | PRN

## 2022-07-29 MED ORDER — PHENOL 1.4 % MT LIQD: 1.0000 | OROMUCOSAL | Status: DC | PRN

## 2022-07-29 MED ORDER — PHENYLEPHRINE HCL-NACL 20-0.9 MG/250ML-% IV SOLN
INTRAVENOUS | Status: DC | PRN
  Administered 2022-07-29: 30 ug/min via INTRAVENOUS

## 2022-07-29 MED ORDER — SODIUM CHLORIDE FLUSH 0.9 % IV SOLN
INTRAVENOUS | Status: AC
  Filled 2022-07-29: qty 20

## 2022-07-29 MED ORDER — FENTANYL CITRATE (PF) 100 MCG/2ML IJ SOLN
INTRAMUSCULAR | Status: DC | PRN
  Administered 2022-07-29 (×2): 50 ug via INTRAVENOUS

## 2022-07-29 MED ORDER — ALBUTEROL SULFATE (2.5 MG/3ML) 0.083% IN NEBU: 2.5000 mg | INHALATION_SOLUTION | Freq: Four times a day (QID) | RESPIRATORY_TRACT | Status: DC | PRN

## 2022-07-29 MED ORDER — OXYCODONE HCL 5 MG PO TABS
10.0000 mg | ORAL_TABLET | ORAL | Status: DC | PRN
  Administered 2022-07-29 – 2022-08-01 (×9): 10 mg via ORAL
  Filled 2022-07-29 (×9): qty 2

## 2022-07-29 MED ORDER — DEXAMETHASONE SODIUM PHOSPHATE 10 MG/ML IJ SOLN
INTRAMUSCULAR | Status: DC | PRN
  Administered 2022-07-29: 10 mg via INTRAVENOUS

## 2022-07-29 MED ORDER — VITAMIN D 25 MCG (1000 UNIT) PO TABS
5000.0000 [IU] | ORAL_TABLET | Freq: Every day | ORAL | Status: DC
  Administered 2022-07-30 – 2022-08-02 (×4): 5000 [IU] via ORAL
  Filled 2022-07-29 (×6): qty 5

## 2022-07-29 MED ORDER — TIZANIDINE HCL 4 MG PO TABS
4.0000 mg | ORAL_TABLET | Freq: Four times a day (QID) | ORAL | Status: DC | PRN
  Administered 2022-07-30: 4 mg via ORAL
  Filled 2022-07-29: qty 1

## 2022-07-29 MED ORDER — ATORVASTATIN CALCIUM 20 MG PO TABS
40.0000 mg | ORAL_TABLET | Freq: Every day | ORAL | Status: DC
  Administered 2022-07-30 – 2022-08-02 (×4): 40 mg via ORAL
  Filled 2022-07-29 (×4): qty 2

## 2022-07-29 MED ORDER — HYDROMORPHONE HCL 1 MG/ML IJ SOLN
0.2500 mg | INTRAMUSCULAR | Status: DC | PRN
  Administered 2022-07-29 (×2): 0.25 mg via INTRAVENOUS
  Administered 2022-07-29: 0.5 mg via INTRAVENOUS

## 2022-07-29 MED ORDER — MIDAZOLAM HCL 2 MG/2ML IJ SOLN
INTRAMUSCULAR | Status: AC
  Filled 2022-07-29: qty 2

## 2022-07-29 MED ORDER — PHENYLEPHRINE 80 MCG/ML (10ML) SYRINGE FOR IV PUSH (FOR BLOOD PRESSURE SUPPORT)
PREFILLED_SYRINGE | INTRAVENOUS | Status: DC | PRN
  Administered 2022-07-29: 80 ug via INTRAVENOUS
  Administered 2022-07-29: 160 ug via INTRAVENOUS

## 2022-07-29 MED ORDER — KETOROLAC TROMETHAMINE 15 MG/ML IJ SOLN
INTRAMUSCULAR | Status: AC
  Filled 2022-07-29: qty 1

## 2022-07-29 MED ORDER — CEFAZOLIN IN SODIUM CHLORIDE 2-0.9 GM/100ML-% IV SOLN
2.0000 g | Freq: Once | INTRAVENOUS | Status: AC
  Administered 2022-07-29 (×2): 2 g via INTRAVENOUS

## 2022-07-29 MED ORDER — ACETAMINOPHEN 10 MG/ML IV SOLN
INTRAVENOUS | Status: DC | PRN
  Administered 2022-07-29: 1000 mg via INTRAVENOUS

## 2022-07-29 MED ORDER — LACTATED RINGERS IV SOLN: INTRAVENOUS | Status: DC

## 2022-07-29 MED ORDER — GLYCOPYRROLATE 0.2 MG/ML IJ SOLN
INTRAMUSCULAR | Status: DC | PRN
  Administered 2022-07-29: .2 mg via INTRAVENOUS

## 2022-07-29 MED ORDER — GABAPENTIN 300 MG PO CAPS
600.0000 mg | ORAL_CAPSULE | Freq: Two times a day (BID) | ORAL | Status: DC
  Administered 2022-07-29 – 2022-08-02 (×8): 600 mg via ORAL
  Filled 2022-07-29 (×8): qty 2

## 2022-07-29 MED ORDER — DOCUSATE SODIUM 100 MG PO CAPS
100.0000 mg | ORAL_CAPSULE | Freq: Two times a day (BID) | ORAL | Status: DC
  Administered 2022-07-29 – 2022-08-02 (×8): 100 mg via ORAL
  Filled 2022-07-29 (×8): qty 1

## 2022-07-29 MED ORDER — BUPIVACAINE LIPOSOME 1.3 % IJ SUSP
INTRAMUSCULAR | Status: AC
  Filled 2022-07-29: qty 20

## 2022-07-29 MED ORDER — PANTOPRAZOLE SODIUM 40 MG PO TBEC
40.0000 mg | DELAYED_RELEASE_TABLET | Freq: Every day | ORAL | Status: DC
  Administered 2022-07-30 – 2022-08-02 (×4): 40 mg via ORAL
  Filled 2022-07-29 (×4): qty 1

## 2022-07-29 MED ORDER — SODIUM CHLORIDE (PF) 0.9 % IJ SOLN
INTRAMUSCULAR | Status: DC | PRN
  Administered 2022-07-29: 60 mL

## 2022-07-29 MED ORDER — ORAL CARE MOUTH RINSE: 15.0000 mL | Freq: Once | OROMUCOSAL | Status: AC

## 2022-07-29 MED ORDER — ALBUTEROL SULFATE (2.5 MG/3ML) 0.083% IN NEBU: 3.0000 mL | INHALATION_SOLUTION | RESPIRATORY_TRACT | Status: DC | PRN

## 2022-07-29 MED ORDER — SODIUM CHLORIDE 0.9 % IV SOLN: 250.0000 mL | INTRAVENOUS | Status: DC

## 2022-07-29 MED ORDER — SODIUM CHLORIDE 0.9% FLUSH: 3.0000 mL | INTRAVENOUS | Status: DC | PRN

## 2022-07-29 MED ORDER — SODIUM CHLORIDE 0.9 % IV SOLN: INTRAVENOUS | Status: DC

## 2022-07-29 MED ORDER — DIPHENHYDRAMINE HCL 50 MG/ML IJ SOLN
INTRAMUSCULAR | Status: DC | PRN
  Administered 2022-07-29: 12.5 mg via INTRAVENOUS

## 2022-07-29 MED ORDER — MIDAZOLAM HCL 2 MG/2ML IJ SOLN
INTRAMUSCULAR | Status: DC | PRN
  Administered 2022-07-29: 2 mg via INTRAVENOUS

## 2022-07-29 MED ORDER — PROPOFOL 10 MG/ML IV BOLUS
INTRAVENOUS | Status: DC | PRN
  Administered 2022-07-29: 30 mg via INTRAVENOUS
  Administered 2022-07-29: 200 mg via INTRAVENOUS
  Administered 2022-07-29: 40 mg via INTRAVENOUS

## 2022-07-29 SURGICAL SUPPLY — 93 items
ADH SKN CLS APL DERMABOND .7 (GAUZE/BANDAGES/DRESSINGS) ×1
AGENT HMST KT MTR STRL THRMB (HEMOSTASIS) ×1
ALLOGRAFT BONE FIBER KORE 5 (Bone Implant) IMPLANT
ALLOGRAFT BONESTRP KORE 2.5X10 (Bone Implant) IMPLANT
APL PRP STRL LF DISP 70% ISPRP (MISCELLANEOUS) ×1
BASIN KIT SINGLE STR (MISCELLANEOUS) ×1 IMPLANT
BIT DRILL RELINE C (BIT) IMPLANT
BIT DRL RELINE C (BIT) ×1
BULB RESERV EVAC DRAIN JP 100C (MISCELLANEOUS) IMPLANT
BUR NEURO DRILL SOFT 3.0X3.8M (BURR) ×1 IMPLANT
CHLORAPREP W/TINT 26 (MISCELLANEOUS) ×1 IMPLANT
COVERAGE SUPP BRAINLAB NG SPNE (MISCELLANEOUS) ×1 IMPLANT
COVERAGE SUPPORT SPINE BRAINLB (MISCELLANEOUS) ×1
DERMABOND ADVANCED .7 DNX12 (GAUZE/BANDAGES/DRESSINGS) ×1 IMPLANT
DRAIN CHANNEL JP 10F RND 20C F (MISCELLANEOUS) IMPLANT
DRAPE C ARM PK CFD 31 SPINE (DRAPES) ×1 IMPLANT
DRAPE INCISE IOBAN 66X45 STRL (DRAPES) IMPLANT
DRAPE LAPAROTOMY 100X77 ABD (DRAPES) ×1 IMPLANT
DRAPE LAPAROTOMY 77X122 PED (DRAPES) ×1 IMPLANT
DRAPE MICROSCOPE SPINE 48X150 (DRAPES) ×1 IMPLANT
DRAPE SCAN PATIENT (DRAPES) ×1 IMPLANT
DRSG OPSITE POSTOP 4X6 (GAUZE/BANDAGES/DRESSINGS) ×2 IMPLANT
ELECT CAUTERY BLADE TIP 2.5 (TIP) ×1
ELECT REM PT RETURN 9FT ADLT (ELECTROSURGICAL) ×1
ELECTRODE CAUTERY BLDE TIP 2.5 (TIP) ×1 IMPLANT
ELECTRODE REM PT RTRN 9FT ADLT (ELECTROSURGICAL) ×1 IMPLANT
EX-PIN ORTHOLOCK NAV 4X150 (PIN) IMPLANT
FEE CVG SUPP BRAINLAB NG SPNE (MISCELLANEOUS) IMPLANT
FEE INTRAOP CADWELL SUPPLY NCS (MISCELLANEOUS) IMPLANT
FEE INTRAOP MONITOR IMPULS NCS (MISCELLANEOUS) IMPLANT
GAUZE 4X4 16PLY ~~LOC~~+RFID DBL (SPONGE) IMPLANT
GLOVE BIOGEL PI IND STRL 6.5 (GLOVE) ×1 IMPLANT
GLOVE BIOGEL PI IND STRL 8.5 (GLOVE) ×1 IMPLANT
GLOVE SURG SYN 6.5 ES PF (GLOVE) ×6 IMPLANT
GLOVE SURG SYN 6.5 PF PI (GLOVE) ×1 IMPLANT
GLOVE SURG SYN 8.5  E (GLOVE) ×6
GLOVE SURG SYN 8.5 E (GLOVE) ×6 IMPLANT
GLOVE SURG SYN 8.5 PF PI (GLOVE) ×3 IMPLANT
GOWN SRG LRG LVL 4 IMPRV REINF (GOWNS) ×1 IMPLANT
GOWN SRG XL LVL 3 NONREINFORCE (GOWNS) ×1 IMPLANT
GOWN STRL NON-REIN TWL XL LVL3 (GOWNS) ×3
GOWN STRL REIN LRG LVL4 (GOWNS) ×1
GRAFT DURAGEN MATRIX 1WX1L (Tissue) IMPLANT
HEMOVAC 400CC 10FR (MISCELLANEOUS) ×1 IMPLANT
HOLDER FOLEY CATH W/STRAP (MISCELLANEOUS) ×1 IMPLANT
INTRAOP CADWELL SUPPLY FEE NCS (MISCELLANEOUS) ×1
INTRAOP DISP SUPPLY FEE NCS (MISCELLANEOUS) ×1
INTRAOP MONITOR FEE IMPULS NCS (MISCELLANEOUS) ×1
KIT PREVENA INCISION MGT 13 (CANNISTER) ×1 IMPLANT
KIT SPINAL PRONEVIEW (KITS) ×1 IMPLANT
KIT TURNOVER KIT A (KITS) ×1 IMPLANT
MANIFOLD NEPTUNE II (INSTRUMENTS) ×1 IMPLANT
MARKER SKIN DUAL TIP RULER LAB (MISCELLANEOUS) ×1 IMPLANT
MARKER SPHERE PSV REFLC 13MM (MARKER) ×7 IMPLANT
NDL SAFETY ECLIP 18X1.5 (MISCELLANEOUS) ×1 IMPLANT
NS IRRIG 1000ML POUR BTL (IV SOLUTION) ×1 IMPLANT
PACK LAMINECTOMY NEURO (CUSTOM PROCEDURE TRAY) ×1 IMPLANT
PAD ARMBOARD 7.5X6 YLW CONV (MISCELLANEOUS) ×2 IMPLANT
PIN CASPAR 14 (PIN) ×1 IMPLANT
PIN CASPAR 14MM (PIN) ×1
PLATE ACP 1.9X52 3LVL (Plate) IMPLANT
PUTTY DBX 1CC (Putty) IMPLANT
Reline C 3.5 x 30 Screw lawson 151390 IMPLANT
Reline C 3.5 x 32 Screw lawson 151391 IMPLANT
Reline C Rod 110mm lawson 151389 IMPLANT
SCREW ACP 3.5X17 S/D VARIA (Screw) IMPLANT
SCREW LOCK RELINE C OPEN (Screw) IMPLANT
SCREW MA RELINE C 3.5X14 (Screw) ×6 IMPLANT
SCREW MA RELINE C 5X25 (Screw) ×1 IMPLANT
SCREW REL-C 5X30 (Screw) ×3 IMPLANT
SCREW SP MA RELINE-C 3.5X14 (Screw) IMPLANT
SCREW SP MA TH RELINE-C 5X25 (Screw) IMPLANT
SCREW SP MA TH RELINE-C 5X30 (Screw) IMPLANT
SOLUTION IRRIG SURGIPHOR (IV SOLUTION) ×1 IMPLANT
SPACER C HEDRON 12X14 7M 7D (Spacer) IMPLANT
SPONGE KITTNER 5P (MISCELLANEOUS) ×1 IMPLANT
STAPLER SKIN PROX 35W (STAPLE) ×2 IMPLANT
SURGIFLO W/THROMBIN 8M KIT (HEMOSTASIS) ×1 IMPLANT
SUT DVC VLOC 3-0 CL 6 P-12 (SUTURE) IMPLANT
SUT ETHILON 3-0 FS-10 30 BLK (SUTURE) ×1
SUT V-LOC 90 ABS DVC 3-0 CL (SUTURE) IMPLANT
SUT VIC AB 0 CT1 27 (SUTURE) ×3
SUT VIC AB 0 CT1 27XCR 8 STRN (SUTURE) ×3 IMPLANT
SUT VIC AB 2-0 CT1 18 (SUTURE) ×2 IMPLANT
SUT VIC AB 3-0 SH 8-18 (SUTURE) ×1 IMPLANT
SUTURE EHLN 3-0 FS-10 30 BLK (SUTURE) IMPLANT
SYR 20ML LL LF (SYRINGE) ×1 IMPLANT
SYR 30ML LL (SYRINGE) ×2 IMPLANT
TAPE CLOTH 3X10 WHT NS LF (GAUZE/BANDAGES/DRESSINGS) ×3 IMPLANT
TOWEL OR 17X26 4PK STRL BLUE (TOWEL DISPOSABLE) ×2 IMPLANT
TRAP FLUID SMOKE EVACUATOR (MISCELLANEOUS) ×1 IMPLANT
TRAY FOLEY SLVR 16FR LF STAT (SET/KITS/TRAYS/PACK) ×1 IMPLANT
WATER STERILE IRR 1000ML POUR (IV SOLUTION) ×2 IMPLANT

## 2022-07-29 NOTE — Anesthesia Preprocedure Evaluation (Signed)
Anesthesia Evaluation  Patient identified by MRN, date of birth, ID band Patient awake    Reviewed: Allergy & Precautions, NPO status , Patient's Chart, lab work & pertinent test results  History of Anesthesia Complications Negative for: history of anesthetic complications  Airway Mallampati: II  TM Distance: >3 FB Neck ROM: full    Dental  (+) Chipped, Poor Dentition, Missing   Pulmonary shortness of breath and with exertion, asthma , former smoker   Pulmonary exam normal        Cardiovascular Exercise Tolerance: Good (-) angina (-) Past MI and (-) DOE Normal cardiovascular exam     Neuro/Psych  Headaches PSYCHIATRIC DISORDERS       Neuromuscular disease    GI/Hepatic Neg liver ROS,GERD  Controlled,,  Endo/Other  negative endocrine ROS    Renal/GU      Musculoskeletal   Abdominal   Peds  Hematology negative hematology ROS (+)   Anesthesia Other Findings Past Medical History: No date: Anxiety No date: Arthritis No date: Asthma     Comment:  as a child No date: Breast cancer (HCC) No date: Carpal tunnel syndrome     Comment:  R hand  No date: Colon polyps No date: Depression No date: Dyspnea No date: Eosinophilic esophagitis     Comment:  noted in [place] with GI path report 09/29/14  No date: Esophageal stricture     Comment:  s/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               Raul Del  No date: Family history of breast cancer No date: Family history of prostate cancer 04/09/2022: Gastroesophageal reflux disease without esophagitis No date: Herniated disc, cervical     Comment:  s/p epidural injection No date: Herpes No date: History of kidney stones No date: Hyperlipidemia No date: Low back pain No date: Migraines No date: Personal history of chemotherapy No date: Personal history of radiation therapy No date: Pneumonia  Past Surgical History: 12/25/2018: BREAST BIOPSY;  Left     Comment:  [place] BX, invasive mammary carcinoma  No date: BREAST LUMPECTOMY 01/16/2019: BREAST LUMPECTOMY WITH NEEDLE LOCALIZATION AND AXILLARY  SENTINEL LYMPH NODE BX; Left     Comment:  Procedure: LEFT BREAST LUMPECTOMY WITH NEEDLE               LOCALIZATION AND SENTINEL LYMPH NODE BIOPSY;  Surgeon:               Griselda Miner, MD;  Location: [facility] ORS;  Service:               General;  Laterality: Left; No date: CESAREAN SECTION 03/27/2017: COLONOSCOPY WITH PROPOFOL; N/A     Comment:  Procedure: COLONOSCOPY WITH PROPOFOL;  Surgeon: Wyline Mood, MD;  Location: [facility] ENDOSCOPY;  Service:               Gastroenterology;  Laterality: N/A; 04/04/2022: COLONOSCOPY WITH PROPOFOL; N/A     Comment:  Procedure: COLONOSCOPY WITH PROPOFOL;  Surgeon: Wyline Mood, MD;  Location: [facility] ENDOSCOPY;  Service:               Gastroenterology;  Laterality: N/A; No date: ESOPHAGOGASTRODUODENOSCOPY 03/27/2017: ESOPHAGOGASTRODUODENOSCOPY (EGD) WITH PROPOFOL; N/A     Comment:  Procedure: ESOPHAGOGASTRODUODENOSCOPY (EGD) WITH  PROPOFOL WITH DILATION;  Surgeon: Wyline Mood, MD;                Location: [facility] ENDOSCOPY;  Service: Gastroenterology;                Laterality: N/A; No date: PORT-A-CATH REMOVAL 02/15/2019: PORTACATH PLACEMENT; N/A     Comment:  Procedure: INSERTION PORT-A-CATH WITH POSSIBLE               ULTRASOUND;  Surgeon: Griselda Miner, MD;  Location: [facility]              ORS;  Service: General;  Laterality: N/A; 02/15/2019: RE-EXCISION OF BREAST CANCER,SUPERIOR MARGINS; Left     Comment:  Procedure: RE-EXCISION OF LEFT BREAST CANCER ANTERIOR               MARGINS;  Surgeon: Griselda Miner, MD;  Location: [facility]               ORS;  Service: General;  Laterality: Left; 2015: THROAT SURGERY 2006: UMBILICAL HERNIA REPAIR     Comment:   x 2   w mesh per pt  BMI    Body Mass Index: 32.42 kg/m      Reproductive/Obstetrics negative OB ROS                              Anesthesia Physical Anesthesia Plan  ASA: 3  Anesthesia Plan: General ETT   Post-op Pain Management:    Induction: Intravenous  PONV Risk Score and Plan: Ondansetron, Dexamethasone, Midazolam and Treatment may vary due to age or medical condition  Airway Management Planned: Oral ETT  Additional Equipment:   Intra-op Plan:   Post-operative Plan: Extubation in OR  Informed Consent: I have reviewed the patients History and Physical, chart, labs and discussed the procedure including the risks, benefits and alternatives for the proposed anesthesia with the patient or authorized representative who has indicated his/her understanding and acceptance.     Dental Advisory Given  Plan Discussed with: Anesthesiologist, CRNA and Surgeon  Anesthesia Plan Comments: (Patient consented for risks of anesthesia including but not limited to:  - adverse reactions to medications - damage to eyes, teeth, lips or other oral mucosa - nerve damage due to positioning  - sore throat or hoarseness - Damage to heart, brain, nerves, lungs, other parts of body or loss of life  Patient voiced understanding.)       Anesthesia Quick Evaluation

## 2022-07-29 NOTE — Op Note (Signed)
Indications: [name] is a 56 y.o. 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   Preoperative Note:   Risks of surgery discussed include: infection, bleeding, stroke, coma, death, paralysis, CSF leak, nerve/spinal cord injury, numbness, tingling, weakness, complex regional pain syndrome, recurrent stenosis and/or disc herniation, vascular injury, development of instability, neck/back pain, need for further surgery, persistent symptoms, development of deformity, and the risks of anesthesia. The patient understood these risks and agreed to proceed.  Operative Note:   Operative Procedure: 1. Anterior Cervical Discectomy C7-T1 including bilateral foraminotomies and end plate preparation  2. Anterior Cervical Discectomy C5-6 including bilateral foraminotomies and end plate preparation  3. Anterior Cervical Discectomy C6-7 including bilateral foraminotomies and end plate preparation 4. Anterior Spinal Instrumentation C5 to T1 using Nuvasive ACP 5. Anterior arthrodesis from C5 to T1 with placement of biomechanical devices at C7-T1, C5-6, and C6-7 6. Use of the operative microscope 7. Use of intraoperative flouroscopy 8. Posterior instrumentation C2-T2 9. Posterior spinal fusion C2-T2 using Nuvasive Reline C 10. Use of stereotaxis  PROCEDURE IN DETAIL: After obtaining informed consent,  the patient taken to the operating room, placed in supine position, general anesthesia induced.  The patient had a small shoulder roll placed behind their shoulders.  The patient received preop antibiotics and IV Decadron.  The patient had a neck incision outlined, was prepped and draped in usual sterile fashion. The incision was injected with local anesthetic.   An incision was opened, dissection taken down medial to the carotid artery and jugular vein, lateral to the trachea and esophagus.  The prevertebral fascia identified and a localizing x-ray demonstrated the correct level.  The longus colli were dissected laterally, and self-retaining retractors placed to open the operative field. The microscope was then brought into the field.  With this complete, distractor pins were placed in the vertebral bodies of C5 and C6.  The distractor was placed at C5-6.  The annulus at C5-6 was opened. Curettes and pituitary rongeurs used to remove the majority of disk, then the drill was used to remove the posterior osteophyte, expose the posterior longitudinal ligament, and begin the foraminotomies. The nerve hook was used to elevate the posterior longitudinal ligament, which was then removed with Kerrison rongeurs to complete decompression of the spinal cord. The Kerrison rongeurs were then used to complete the foraminotomies bilaterally to decompress the nerve roots. The nerve hook could be passed out each foramen, ensuring decompression of the nerve roots. Meticulous hemostasis was obtained. A biomechanical device (Globus Hedron C 7 mm height x 14 mm width by 12 mm depth) was placed at C5/6. The device had been filled with demineralized bone matrix for aid in arthrodesis.  Please note that the procedure included removal of the disc, removal of the posterior osteophytes, and removal of the posterior longitudinal ligament to ensure decompression of the spinal cord.  Additionally, foraminotomies were performed on both sides  of the spinal canal to decompress the nerve roots.   The caspar  distractor was removed. The pin at C5 was removed and bone wax used for hemostasis.  The pin was then repositioned at T1 and the distractor placed from C6-T1.    The annulus of C7-T1 was opened.  Curettes and pituitary rongeurs used to remove the majority of disk, then the drill was used to remove the posterior osteophyte, expose the posterior longitudinal ligament, and begin the foraminotomies. The nerve hook was used to elevate the posterior longitudinal ligament, which was then removed with Kerrison rongeurs to complete decompression of the spinal cord. The Kerrison rongeurs were then used to complete the foraminotomies bilaterally to decompress the nerve roots. The nerve hook could be passed out each foramen, ensuring decompression of the nerve roots. Meticulous hemostasis was obtained. A biomechanical device (Globus Hedron C 7 mm height x 14 mm width by 12 mm depth) was placed at C7/T1. The device had been filled with demineralized bone matrix for aid in arthrodesis.  Please note that the procedure included removal of the disc, removal of the posterior osteophytes, and removal of the posterior longitudinal ligament to ensure decompression of the spinal cord.  Additionally, foraminotomies were performed on both sides of the spinal canal to decompress the nerve roots.  The annulus of C6-7 was opened.  Curettes and pituitary rongeurs used to remove the majority of disk, then the drill was used to remove the posterior osteophyte, expose the posterior longitudinal ligament, and begin the foraminotomies. The nerve hook was used to elevate the posterior longitudinal ligament, which was then removed with Kerrison rongeurs to complete decompression of the spinal cord. The Kerrison rongeurs were then used to complete the foraminotomies bilaterally to decompress the nerve roots. The nerve hook could be passed out each foramen, ensuring decompression  of the nerve roots. Meticulous hemostasis was obtained. A biomechanical device (Globus Hedron C 7 mm height x 14 mm width by 12 mm depth) was placed at C6/7. The device had been filled with demineralized bone matrix for aid in arthrodesis.  Please note that the procedure included removal of the disc, removal of the posterior osteophytes, and removal of the posterior longitudinal ligament to ensure decompression of the spinal cord.  Additionally, foraminotomies were performed on both sides of the spinal canal to decompress the nerve roots.  A four segment, three level plate (52 mm Nuvasive ACP) was chosen.  Two screws placed in the vertebral bodies of all four segments, respectively making sure the screws were behind the locking mechanism.  Final AP and lateral radiographs were taken.   Please note that the plate is not inclusive to the biomechanical devices.  The anchoring mechanism of the plate is completely separate from the biomechanical devices.  A Drain was placed.  With everything in good position, the wound was irrigated copiously and closed in 2 layers using interrupted inverted 3-0 Vicryl sutures and monocryl.  The wound was dressed with dermabond then we repositioned for the posterior portion. Monitoring was stable throughout the anterior portion of the procedure..  All counts were correct at the end of the anterior portion of the case.   We then repositioned for the posterior portion of the procedure.  The patient was positioned in the prone position in appropriate alignment for a posterior cervical approach.  The incision was outlined.  The area was prepped and draped.  The timeout was confirmed.  Midline incision was then carried out.  The soft tissue were divided.  A subperiosteal dissection was used to expose C2-T2 posteriorly.  The  stereotactic array was placed using a spinous process clamp.  We then utilized the stereotactic fluoroscope to acquire 3D CT images which were registered to  the [facility] system.  Using the [facility] system, the stereotactic drill guide was used to cannulate the pars bilaterally at C2.  Additionally, the pedicles were cannulated bilaterally at T1 and T2 using the stereotactic drill guide.  3.5 x 32 mm screw was placed on the right at C2 and 3.5 x 30 mm screw was placed on the left at C2.  At T1, 5  by 30 mm was placed on the right and 5 by 25 on the left.  5 by 30mm screws  were placed bilaterally at T2.  We then used modified Magerl technique to place lateral mass screws.  Lateral mass screws were placed at C3, C4, and C6 on the right and C4-C6 on the left due to anatomic difficulty with C5 on the right and C3 on the left.  3.5 x 14 mm screws were placed at each level.  A rod was then shaped and secured to the screw heads according to manufacturer specifications.  Final alignment was achieved with coronal and sagittal plane benders.  We then obtained another CT scan to confirm appropriate placement of all implants.  At this point, we turned our attention to closure.  The area was copiously irrigated.  The high-speed drill was used to decorticate the posterior elements from C2-T2 inclusive.  Allograft was placed to allow for arthrodesis from C2-T2.  A subfascial drain was placed.  We then closed the wound in layers using 0 and 2-0 Vicryl.  3-0 Monocryl was placed on the skin.  2 drains were placed prior to closure.  An incisional wound VAC was placed on the skin at the end of the case.  The patient was then rotated back onto her bed and extubated without incident.  I performed the entire procedure with the assistance of Manning Charity PA as an Designer, television/film set. An assistant was required for this procedure due to the complexity.  The assistant provided assistance in tissue manipulation and suction, and was required for the successful and safe performance of the procedure. I performed the critical portions of the procedure.   Venetia Night MD

## 2022-07-29 NOTE — Discharge Instructions (Signed)
Your surgeon has performed an operation on your cervical spine (neck) to relieve pressure on the spinal cord and/or nerves. This involved making an incision in the front of your neck and removing one or more of the discs that support your spine. Next, a small piece of bone, a titanium plate, and screws were used to fuse two or more of the vertebrae (bones) together.  The following are instructions to help in your recovery once you have been discharged from the hospital. Even if you feel well, it is important that you follow these activity guidelines. If you do not let your neck heal properly from the surgery, you can increase the chance of return of your symptoms and other complications.  * Do not take anti-inflammatory medications for 3 months after surgery (naproxen [Aleve], ibuprofen [Advil, Motrin], etc.). These medications can prevent your bones from healing properly.  Celebrex, if prescribed, is ok to take.  Activity    No bending, lifting, or twisting ("BLT"). Avoid lifting objects heavier than 10 pounds (gallon milk jug).  Where possible, avoid household activities that involve lifting, bending, reaching, pushing, or pulling such as laundry, vacuuming, grocery shopping, and childcare. Try to arrange for help from friends and family for these activities while your back heals.  Increase physical activity slowly as tolerated.  Taking short walks is encouraged, but avoid strenuous exercise. Do not jog, run, bicycle, lift weights, or participate in any other exercises unless specifically allowed by your doctor.  Talk to your doctor before resuming sexual activity.  You should not drive until cleared by your doctor.  Until released by your doctor, you should not return to work or school.  You should rest at home and let your body heal.   You may shower three days after your surgery.  After showering, lightly dab your incision dry. Do not take a tub bath or go swimming until approved by your  doctor at your follow-up appointment.  If your doctor ordered a cervical collar (neck brace) for you, you should wear it whenever you are out of bed. You may remove it when lying down or sleeping, but you should wear it at all other times. Not all neck surgeries require a cervical collar.  If you smoke, we strongly recommend that you quit.  Smoking has been proven to interfere with normal bone healing and will dramatically reduce the success rate of your surgery. Please contact [facility] (800-QUIT-NOW) and use the resources at www.[facility].com for assistance in stopping smoking.  Surgical Incision   If you have a dressing on your incision, you may remove it two days after your surgery. Keep your incision area clean and dry.  If you have staples or stitches on your incision, you should have a follow up scheduled for removal. If you do not have staples or stitches, you will have steri-strips (small pieces of surgical tape) or Dermabond glue. The steri-strips/glue should begin to peel away within about a week (it is fine if the steri-strips fall off before then). If the strips are still in place one week after your surgery, you may gently remove them.  Diet           You may return to your usual diet. However, you may experience discomfort when swallowing in the first month after your surgery. This is normal. You may find that softer foods are more comfortable for you to swallow. Be sure to stay hydrated.  When to Contact Us  You may experience pain in your   neck and/or pain between your shoulder blades. This is normal and should improve in the next few weeks with the help of pain medication, muscle relaxers, and rest. Some patients report that a warm compress on the back of the neck or between the shoulder blades helps.  However, should you experience any of the following, contact us immediately: New numbness or weakness Pain that is progressively getting worse, and is not relieved by your pain  medication, muscle relaxers, rest, and warm compresses Bleeding, redness, swelling, pain, or drainage from surgical incision Chills or flu-like symptoms Fever greater than 101.0 F (38.3 C) Inability to eat, drink fluids, or take medications Problems with bowel or bladder functions Difficulty breathing or shortness of breath Warmth, tenderness, or swelling in your calf Contact Information During office hours (Monday-Friday 9 am to 5 pm), please call your physician at [phone] and ask for Kendelyn Jean After hours and weekends, please call [phone] and speak with the neurosurgeon on call For a life-threatening emergency, call 911  

## 2022-07-29 NOTE — Transfer of Care (Signed)
Immediate Anesthesia Transfer of Care Note  Patient: Leah [name]  Procedure(s) Performed: C5-T1 ANTERIOR CERVICAL DISCECTOMY AND FUSION C2-T2 POSTERIOR SPINAL FUSION APPLICATION OF INTRAOPERATIVE CT SCAN  Patient Location: PACU  Anesthesia Type:General  Level of Consciousness: drowsy  Airway & Oxygen Therapy: Patient Spontanous Breathing and Patient connected to face mask oxygen  Post-op Assessment: Report given to RN and Post -op Vital signs reviewed and stable  Post vital signs: Reviewed and stable  Last Vitals:  Vitals Value Taken Time  BP 119/77 07/29/22 1805  Temp    Pulse 90 07/29/22 1812  Resp 21 07/29/22 1812  SpO2 100 % 07/29/22 1812  Vitals shown include unvalidated device data.  Last Pain:  Vitals:   07/29/22 0725  TempSrc: Temporal  PainSc: 9          Complications: No notable events documented.

## 2022-07-29 NOTE — Anesthesia Procedure Notes (Signed)
Procedure Name: Intubation Date/Time: 07/29/2022 11:22 AM  Performed by: Mohammed Kindle, CRNAPre-anesthesia Checklist: Patient identified, Emergency Drugs available, Suction available and Patient being monitored Patient Re-evaluated:Patient Re-evaluated prior to induction Oxygen Delivery Method: Circle system utilized Preoxygenation: Pre-oxygenation with 100% oxygen Induction Type: IV induction Ventilation: Mask ventilation without difficulty Laryngoscope Size: McGraph and 3 Grade View: Grade I Tube type: Oral Tube size: 6.5 mm Number of attempts: 1 Airway Equipment and Method: Stylet and Oral airway Placement Confirmation: ETT inserted through vocal cords under direct vision, positive ETCO2, breath sounds checked- equal and bilateral and CO2 detector Secured at: 21 cm Tube secured with: Tape Dental Injury: Teeth and Oropharynx as per pre-operative assessment

## 2022-07-29 NOTE — Interval H&P Note (Signed)
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  The various methods of treatment have been discussed with the patient and family. After consideration of risks, benefits and other options for treatment, the patient has consented to  Procedure(s): C5-T1 ANTERIOR CERVICAL DISCECTOMY AND FUSION (N/A) C2-T2 POSTERIOR SPINAL FUSION (N/A) APPLICATION OF INTRAOPERATIVE CT SCAN (N/A) as a surgical intervention.  The patient's history has been reviewed, patient examined, no change in status, stable for surgery.  I have reviewed the patient's chart and labs.  Questions were answered to the patient's satisfaction.    Heart sounds normal no MRG. Chest Clear to Auscultation Bilaterally.   [name]

## 2022-07-29 NOTE — Anesthesia Postprocedure Evaluation (Signed)
Anesthesia Post Note  Patient: Shanetha Mendell  Procedure(s) Performed: C5-T1 ANTERIOR CERVICAL DISCECTOMY AND FUSION C2-T2 POSTERIOR SPINAL FUSION APPLICATION OF INTRAOPERATIVE CT SCAN  Patient location during evaluation: PACU Anesthesia Type: General Level of consciousness: awake and alert Pain management: pain level controlled Vital Signs Assessment: post-procedure vital signs reviewed and stable Respiratory status: spontaneous breathing, nonlabored ventilation, respiratory function stable and patient connected to nasal cannula oxygen Cardiovascular status: blood pressure returned to baseline and stable Postop Assessment: no apparent nausea or vomiting Anesthetic complications: no   No notable events documented.   Last Vitals:  Vitals:   07/29/22 1915 07/29/22 1930  BP: 129/77 136/83  Pulse: 100 100  Resp: 16 11  Temp:  37.2 C  SpO2: 95% 96%    Last Pain:  Vitals:   07/29/22 1930  TempSrc:   PainSc: Asleep                 Foye Deer

## 2022-07-30 MED ORDER — KETOROLAC TROMETHAMINE 15 MG/ML IJ SOLN
15.0000 mg | Freq: Four times a day (QID) | INTRAMUSCULAR | Status: DC
  Administered 2022-07-30 – 2022-07-31 (×4): 15 mg via INTRAVENOUS
  Filled 2022-07-30 (×5): qty 1

## 2022-07-30 NOTE — Evaluation (Signed)
Physical Therapy Evaluation Patient Details Name: Leah [name] MRN: [id number] DOB: [birth year] Today's Date: 07/30/2022  History of Present Illness  Pt presents s/p POD #1 for C5-T1 cervical discetomy and C2-T2 posterior spinal fusion.  Clinical Impression  Pt presents alert and oriented seated in chair with OT present. She exhibits minor deficits that include unsteadiness in walking, cervical pain, and decreased cervical ROM and strength. She was able to ambulate 500 ft with BUE support from 2WW and negotiate a flight of stairs with use of railings. This is a decrease in her functional baseline which is no use of an AD. She did require additional mod VC when descending stairs because she was unable to tilt head forward to see her feet due to cervical brace. PT to recommend additional skilled PT for pt to return to functional baseline with setting detailed in plan below.      Recommendations for follow up therapy are one component of a multi-disciplinary discharge planning process, led by the attending physician.  Recommendations may be updated based on patient status, additional functional criteria and insurance authorization.  Follow Up Recommendations       Assistance Recommended at Discharge    Patient can return home with the following  Assistance with cooking/housework;Assist for transportation;A little help with bathing/dressing/bathroom;Help with stairs or ramp for entrance    Equipment Recommendations Rolling walker (2 wheels);BSC/3in1  Recommendations for Other Services       Functional Status Assessment       Precautions / Restrictions Precautions Precautions: Cervical Precaution Booklet Issued: Yes (comment) Precaution Comments: Cervical spine surgery precaution handout Required Braces or Orthoses: Cervical Brace Cervical Brace: Hard collar Restrictions Weight Bearing Restrictions: No      Mobility  Bed Mobility                     Transfers Overall transfer level: Modified independent Equipment used: Rolling walker (2 wheels)               General transfer comment: Sit to stand with use of BUE support and 2ww    Ambulation/Gait Ambulation/Gait assistance: Modified independent (Device/Increase time) Gait Distance (Feet): 500 Feet Assistive device: Rolling walker (2 wheels) Gait Pattern/deviations: WFL(Within Functional Limits) Gait velocity: Normal gait speed        Stairs Stairs: Yes Stairs assistance: Modified independent (Device/Increase time) Stair Management: Two rails, Alternating pattern Number of Stairs: 12 General stair comments: Required additional time descending stairs as well as VC because she could not look down  Wheelchair Mobility    Modified Rankin (Stroke Patients Only)       Balance Overall balance assessment: Modified Independent                                           Pertinent Vitals/Pain Pain Assessment Pain Assessment: No/denies pain    Home Living Family/patient expects to be discharged to:: Private residence Living Arrangements: Children Available Help at Discharge: Family;Available PRN/intermittently Type of Home: Apartment Home Access: Stairs to enter Entrance Stairs-Rails: Can reach both Entrance Stairs-Number of Steps: flight   Home Layout: One level Home Equipment: None Additional Comments: lives with son who works    Prior Function Prior Level of Function : Independent/Modified Independent                     Higher education careers adviser   Dominant  Hand: Right    Extremity/Trunk Assessment   Upper Extremity Assessment Upper Extremity Assessment: RUE deficits/detail RUE Deficits / Details: endorses mild tingling, 5/5 grip         Cervical / Trunk Assessment Cervical / Trunk Assessment: Neck Surgery  Communication   Communication: No difficulties  Cognition Arousal/Alertness: Awake/alert Behavior During Therapy: WFL  for tasks assessed/performed Overall Cognitive Status: Within Functional Limits for tasks assessed                                 General Comments: A&Ox4        General Comments      Exercises     Assessment/Plan    PT Assessment Patient needs continued PT services  PT Problem List Decreased range of motion;Decreased strength       PT Treatment Interventions DME instruction;Stair training;Therapeutic exercise    PT Goals (Current goals can be found in the Care Plan section)  Acute Rehab PT Goals Patient Stated Goal: To return home safely PT Goal Formulation: With patient Time For Goal Achievement: 08/13/22 Potential to Achieve Goals: Good    Frequency Min 2X/week     Co-evaluation               AM-PAC PT "6 Clicks" Mobility  Outcome Measure Help needed turning from your back to your side while in a flat bed without using bedrails?: None Help needed moving from lying on your back to sitting on the side of a flat bed without using bedrails?: None Help needed moving to and from a bed to a chair (including a wheelchair)?: None Help needed standing up from a chair using your arms (e.g., wheelchair or bedside chair)?: None Help needed to walk in hospital room?: None Help needed climbing 3-5 steps with a railing? : None 6 Click Score: 24    End of Session Equipment Utilized During Treatment: Gait belt Activity Tolerance: Patient tolerated treatment well Patient left: in chair Nurse Communication: Mobility status;Precautions;Other (comment) (Requesting nurse to reattach IV) PT Visit Diagnosis: Other (comment);Difficulty in walking, not elsewhere classified (R26.2) (Decreased cervical ROM and strength and cervical pain)    Time: 4098-1191 PT Time Calculation (min) (ACUTE ONLY): 20 min   Charges:   PT Evaluation $PT Eval Low Complexity: 1 Low PT Treatments $Therapeutic Activity: 8-22 mins       Ellin Goodie PT, DPT  07/30/2022, 1:40 PM

## 2022-07-30 NOTE — Progress Notes (Signed)
    Attending Progress Note  History: Leah [name] is here for kyphosis, myelopathy, radiculopathy  POD1: Doing very well.  Expected pain.  Physical Exam: Vitals:   07/30/22 0326 07/30/22 0829  BP: 137/80 133/80  Pulse: 99 85  Resp: 18 17  Temp: 97.7 F (36.5 C) 98.5 F (36.9 C)  SpO2: 100% 96%    AA Ox3 CNI  Strength:5/5 throughout BUE and BLE  Data:  Other tests/results:  Drains Anterior 40 Superficial 10 Deep 100  Assessment/Plan:  [name] is doing very well after extensive anterior and posterior spinal reconstruction  - mobilize - pain control - DVT prophylaxis - PTOT - monitor drains - likely to remove anterior and superficial tomorrow   Venetia Night MD, [facility] Department of Neurosurgery

## 2022-07-30 NOTE — Evaluation (Signed)
Occupational Therapy Evaluation Patient Details Name: Leah [name] MRN: [id number] DOB: 10-21-1965 Today's Date: 07/30/2022   History of Present Illness Pt presents s/p POD #1 for C5-T1 cervical discetomy and C2-T2 posterior spinal fusion.   Clinical Impression   Ms Asencio was seen for OT evaluation this date. Prior to hospital admission, pt was IND. Pt lives with son in 2nd story apartment. B grip grossly 5/5 with minimal tingling in L hand noted. Pt currently requires SUPERVISION for toilet t/f. MIN A don/doff aspen collar in sitting. MOD I for LB dressing. Pt educated in functional application of cervical precautions and adapted grooming techniques. Pt verbalized understanding of all education/training provided. Handout provided to support recall and carry over of learned precautions/techniques. All education complete, will sign off. Upon hospital discharge, recommend no OT follow up.    Recommendations for follow up therapy are one component of a multi-disciplinary discharge planning process, led by the attending physician.  Recommendations may be updated based on patient status, additional functional criteria and insurance authorization.   Assistance Recommended at Discharge Set up Supervision/Assistance  Patient can return home with the following A little help with bathing/dressing/bathroom;Help with stairs or ramp for entrance    Functional Status Assessment  Patient has had a recent decline in their functional status and demonstrates the ability to make significant improvements in function in a reasonable and predictable amount of time.  Equipment Recommendations  None recommended by OT    Recommendations for Other Services       Precautions / Restrictions Precautions Precautions: Cervical Precaution Booklet Issued: Yes (comment) Precaution Comments: Cervical spine surgery precaution handout Required Braces or Orthoses: Cervical Brace Cervical Brace: Hard  collar Restrictions Weight Bearing Restrictions: No      Mobility Bed Mobility Overal bed mobility: Independent                  Transfers Overall transfer level: Independent                 General transfer comment: PRN use of IV pole      Balance Overall balance assessment: No apparent balance deficits (not formally assessed)                                         ADL either performed or assessed with clinical judgement   ADL Overall ADL's : Needs assistance/impaired                                       General ADL Comments: MOD I don/doff B socks in sitting. SUPERVISION toilet t/f. MIN A don/doff aspen collar in sitting.      Pertinent Vitals/Pain Pain Assessment Pain Assessment: No/denies pain     Hand Dominance Right   Extremity/Trunk Assessment Upper Extremity Assessment Upper Extremity Assessment: RUE deficits/detail RUE Deficits / Details: endorses mild tingling, 5/5 grip       Cervical / Trunk Assessment Cervical / Trunk Assessment: Neck Surgery   Communication Communication Communication: No difficulties   Cognition Arousal/Alertness: Awake/alert Behavior During Therapy: WFL for tasks assessed/performed Overall Cognitive Status: Within Functional Limits for tasks assessed  Home Living Family/patient expects to be discharged to:: Private residence Living Arrangements: Children Available Help at Discharge: Family;Available PRN/intermittently Type of Home: Apartment Home Access: Stairs to enter Entrance Stairs-Number of Steps: flight Entrance Stairs-Rails: Can reach both Home Layout: One level         Bathroom Toilet: Standard     Home Equipment: None   Additional Comments: lives with son who works      Prior Functioning/Environment Prior Level of Function : Independent/Modified Independent                         OT Problem List: Decreased activity tolerance;Impaired balance (sitting and/or standing);Decreased safety awareness         OT Goals(Current goals can be found in the care plan section) Acute Rehab OT Goals Patient Stated Goal: to go home OT Goal Formulation: With patient Time For Goal Achievement: 07/30/22 Potential to Achieve Goals: Good   AM-PAC OT "6 Clicks" Daily Activity     Outcome Measure Help from another person eating meals?: None Help from another person taking care of personal grooming?: None Help from another person toileting, which includes using toliet, bedpan, or urinal?: A Little Help from another person bathing (including washing, rinsing, drying)?: A Little Help from another person to put on and taking off regular upper body clothing?: None Help from another person to put on and taking off regular lower body clothing?: None 6 Click Score: 22   End of Session    Activity Tolerance: Patient tolerated treatment well Patient left: in chair;with call bell/phone within reach;Other (comment) (PT in room)  OT Visit Diagnosis: Unsteadiness on feet (R26.81)                Time: 6045-4098 OT Time Calculation (min): 18 min Charges:  OT General Charges $OT Visit: 1 Visit OT Evaluation $OT Eval Low Complexity: 1 Low  Kathie Dike, M.S. OTR/L  07/30/22, 10:40 AM  ascom [phone]

## 2022-07-30 NOTE — Progress Notes (Signed)
Occupational Therapy * Physical Therapy * Speech Therapy  DATE 07/30/22 PATIENT NAME Leah [name] PATIENT MRN [id number]  DIAGNOSIS/DIAGNOSIS CODE G95.9 DATE OF DISCHARGE TBD  PRIMARY CARE PHYSICIAN Dr. Clent Ridges PCP PHONE/FAX [phone]    Dear Provider (Name: [facility] Fax: [phone]):   I certify that I have examined this patient and that occupational/physical/speech therapy is necessary on an outpatient basis.    The patient has expressed interest in completing their recommended course of therapy at your location.  Once a formal order from the patient's primary care physician has been obtained, please contact him/her to schedule an appointment for evaluation at your earliest convenience.  [ x ]  Physical Therapy Evaluate and Treat  [ x ]  Occupational Therapy Evaluate and Treat  The patient's primary care physician (listed above) must furnish and be responsible for a formal order such that the recommended services may be furnished while under the primary physician's care, and that the plan of care will be established and reviewed every 30 days (or more often if condition necessitates).

## 2022-07-30 NOTE — Evaluation (Deleted)
Physical Therapy Evaluation Patient Details Name: Leah [name] MRN: [id number] DOB: 08-Jun-1965 Today's Date: 07/30/2022  History of Present Illness  Pt presents s/p POD #1 for C5-T1 cervical discetomy and C2-T2 posterior spinal fusion.  Clinical Impression  Pt presents A&Ox4 in chair with OT present and agreeable to PT. Pt donning neck brace throughout session and nurse present to disconnect IV pump for improved mobility. Pt able to ambulate and negotiate stairs with mod I with use of 2WW with ambulation and railings for stairs. She had sufficient grip strength and sensation to safely grip objects. Pt will benefit from additional skilled PT to improve pt's cervical ROM and strength to return to her PLOF.      Recommendations for follow up therapy are one component of a multi-disciplinary discharge planning process, led by the attending physician.  Recommendations may be updated based on patient status, additional functional criteria and insurance authorization.  Follow Up Recommendations       Assistance Recommended at Discharge    Patient can return home with the following  Assistance with cooking/housework;Assist for transportation;A little help with bathing/dressing/bathroom;Help with stairs or ramp for entrance    Equipment Recommendations Rolling walker (2 wheels);BSC/3in1  Recommendations for Other Services       Functional Status Assessment       Precautions / Restrictions Precautions Precautions: Cervical Precaution Booklet Issued: Yes (comment) Precaution Comments: Cervical spine surgery precaution handout Required Braces or Orthoses: Cervical Brace Cervical Brace: Hard collar Restrictions Weight Bearing Restrictions: No      Mobility  Bed Mobility                    Transfers Overall transfer level: Modified independent Equipment used: Rolling walker (2 wheels)               General transfer comment: Sit to stand with use of BUE support  and 2ww    Ambulation/Gait Ambulation/Gait assistance: Modified independent (Device/Increase time) Gait Distance (Feet): 500 Feet Assistive device: Rolling walker (2 wheels) Gait Pattern/deviations: WFL(Within Functional Limits) Gait velocity: Normal gait speed        Stairs Stairs: Yes Stairs assistance: Modified independent (Device/Increase time) Stair Management: Two rails, Alternating pattern Number of Stairs: 12 General stair comments: Required additional time descending stairs as well as VC because she could not look down  Wheelchair Mobility    Modified Rankin (Stroke Patients Only)       Balance Overall balance assessment: Modified Independent                                           Pertinent Vitals/Pain Pain Assessment Pain Assessment: No/denies pain    Home Living Family/patient expects to be discharged to:: Private residence Living Arrangements: Children Available Help at Discharge: Family;Available PRN/intermittently Type of Home: Apartment Home Access: Stairs to enter Entrance Stairs-Rails: Can reach both Entrance Stairs-Number of Steps: flight   Home Layout: One level Home Equipment: None Additional Comments: lives with son who works    Prior Function Prior Level of Function : Independent/Modified Independent                     Higher education careers adviser   Dominant Hand: Right    Extremity/Trunk Assessment   Upper Extremity Assessment Upper Extremity Assessment: RUE deficits/detail RUE Deficits / Details: endorses mild tingling, 5/5 grip  Cervical / Trunk Assessment Cervical / Trunk Assessment: Neck Surgery  Communication   Communication: No difficulties  Cognition Arousal/Alertness: Awake/alert Behavior During Therapy: WFL for tasks assessed/performed Overall Cognitive Status: Within Functional Limits for tasks assessed                                 General Comments: A&Ox4         General Comments      Exercises     Assessment/Plan    PT Assessment Patient needs continued PT services  PT Problem List Decreased range of motion;Decreased strength       PT Treatment Interventions DME instruction;Stair training;Therapeutic exercise    PT Goals (Current goals can be found in the Care Plan section)  Acute Rehab PT Goals Patient Stated Goal: To return home safely PT Goal Formulation: With patient Time For Goal Achievement: 08/13/22 Potential to Achieve Goals: Good    Frequency Min 2X/week     Co-evaluation               AM-PAC PT "6 Clicks" Mobility  Outcome Measure Help needed turning from your back to your side while in a flat bed without using bedrails?: None Help needed moving from lying on your back to sitting on the side of a flat bed without using bedrails?: None Help needed moving to and from a bed to a chair (including a wheelchair)?: None Help needed standing up from a chair using your arms (e.g., wheelchair or bedside chair)?: None Help needed to walk in hospital room?: None Help needed climbing 3-5 steps with a railing? : None 6 Click Score: 24    End of Session Equipment Utilized During Treatment: Gait belt Activity Tolerance: Patient tolerated treatment well Patient left: in chair Nurse Communication: Mobility status;Precautions;Other (comment) (Requesting nurse to reattach IV) PT Visit Diagnosis: Other (comment);Difficulty in walking, not elsewhere classified (R26.2) (Decreased cervical ROM and strength and cervical pain)    Time: 1610-9604 PT Time Calculation (min) (ACUTE ONLY): 20 min   Charges:     PT Treatments $Therapeutic Activity: 8-22 mins       Ellin Goodie PT, DPT  07/30/2022, 11:36 AM

## 2022-07-30 NOTE — TOC Initial Note (Signed)
Transition of Care [facility]) - Initial/Assessment Note    Patient Details  Name: Leah [name] MRN: [id number] Date of Birth: 1966-01-04  Transition of Care [facility]) CM/SW Contact:    Liliana Cline, LCSW Phone Number: 07/30/2022, 11:21 AM  Clinical Narrative:                 Spoke to patient. Patient is from home with her adult son. PCP is Dr. Clent Ridges. [facility] [place]. PT and OT recommend Outpatient Therapy, RW, and 3in1. Patient is agreable to all of the above. Prefers to use OPPT in [place] close to her home. Referral being sent to [facility] in [place]. Patient is agreeable to RW and 3in1 being ordered closer to DC. TOC handoff updated to order DME when needed.   Expected Discharge Plan: OP Rehab Barriers to Discharge: Continued Medical Work up   Patient Goals and CMS Choice Patient states their goals for this hospitalization and ongoing recovery are:: agreeable to OPPT CMS Medicare.gov Compare Post Acute Care list provided to:: Patient Choice offered to / list presented to : Patient      Expected Discharge Plan and Services       Living arrangements for the past 2 months: Single Family Home                                      Prior Living Arrangements/Services Living arrangements for the past 2 months: Single Family Home Lives with:: Adult Children Patient language and need for interpreter reviewed:: Yes Do you feel safe going back to the place where you live?: Yes      Need for Family Participation in Patient Care: Yes (Comment) Care giver support system in place?: Yes (comment)   Criminal Activity/Legal Involvement Pertinent to Current Situation/Hospitalization: No - Comment as needed  Activities of Daily Living Home Assistive Devices/Equipment: None ADL Screening (condition at time of admission) Patient's cognitive ability adequate to safely complete daily activities?: Yes Is the patient deaf or have difficulty hearing?: No Does  the patient have difficulty seeing, even when wearing glasses/contacts?: No Does the patient have difficulty concentrating, remembering, or making decisions?: No Patient able to express need for assistance with ADLs?: Yes Does the patient have difficulty dressing or bathing?: No Independently performs ADLs?: Yes (appropriate for developmental age) Does the patient have difficulty walking or climbing stairs?: No Weakness of Legs: None Weakness of Arms/Hands: Left  Permission Sought/Granted Permission sought to share information with : Facility Industrial/product designer granted to share information with : Yes, Verbal Permission Granted     Permission granted to share info w AGENCY: DME, OPPT        Emotional Assessment       Orientation: : Oriented to Situation, Oriented to Place, Oriented to Self, Oriented to  Time Alcohol / Substance Use: Not Applicable Psych Involvement: No (comment)  Admission diagnosis:  Cervical myelopathy (HCC) [G95.9] Patient Active Problem List   Diagnosis Date Noted   Cervical myelopathy (HCC) 07/29/2022   Cervical stenosis of spinal canal 07/29/2022   Spondylolisthesis of cervical region 07/29/2022   Other secondary kyphosis, cervical region 07/29/2022   Mood disorder (HCC) 06/13/2022   Encounter for administration of vaccine 06/13/2022   Elevated blood pressure reading 06/13/2022   Dyspnea on exertion 04/09/2022   Gastroesophageal reflux disease without esophagitis 04/09/2022   Personal history of colonic polyps 04/04/2022   Chemotherapy-induced  neuropathy (HCC) 02/25/2022   Osteoarthritis of knee 09/03/2021   ASCUS with positive high risk HPV cervical 08/24/2021   Prediabetes 08/22/2021   Acute pain of right knee 08/20/2021   Cognitive changes 07/03/2020   Annual physical exam 03/19/2020   B12 deficiency 07/19/2019   Obesity, Class I, BMI 30-34.9 03/03/2019   Genetic testing 01/29/2019   Family history of breast cancer    Family  history of prostate cancer    Goals of care, counseling/discussion 01/03/2019   Malignant neoplasm of left female breast (HCC) 12/28/2018   Abnormal MRI of head 12/11/2018   Dizziness 12/11/2018   Chronic pain 12/11/2018   Vitamin D deficiency 12/11/2018   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 08/21/2018   Insomnia 03/23/2018   Allergic rhinitis 01/03/2018   Anxiety 01/03/2018   Cervical radiculopathy 10/03/2017   Tinea corporis 10/03/2017   Hyperlipidemia 04/04/2017   Breast cyst, left 04/04/2017   Aortic atherosclerosis (HCC) 04/04/2017   Anxiety and depression 03/08/2017   Dysphagia 03/08/2017   Hip pain-right 03/08/2017   Low back pain 03/08/2017   Flank pain 10/18/2016   Asthma 06/01/2016   PCP:  Dana Allan, MD [facility]   [facility] [address], [place] - [address] [place] [address] Phone: [phone] Fax: [phone]     Social Determinants of Health (SDOH) Social History: SDOH Screenings   Food Insecurity: Food Insecurity Present (07/29/2022)  Housing: Medium Risk (07/29/2022)  Transportation Needs: No Transportation Needs (07/29/2022)  Utilities: At Risk (07/29/2022)  Alcohol Screen: Low Risk  (06/09/2022)  Depression (PHQ2-9): High Risk (07/20/2022)  Financial Resource Strain: High Risk (06/09/2022)  Physical Activity: Insufficiently Active (06/09/2022)  Social Connections: Socially Isolated (06/09/2022)  Stress: Stress Concern Present (06/09/2022)  Tobacco Use: Medium Risk (07/29/2022)   SDOH Interventions:     Readmission Risk Interventions     No data to display

## 2022-07-30 NOTE — Plan of Care (Signed)
  Problem: Health Behavior/Discharge Planning: Goal: Ability to manage health-related needs will improve Outcome: Progressing   Problem: Clinical Measurements: Goal: Ability to maintain clinical measurements within normal limits will improve Outcome: Progressing Goal: Will remain free from infection Outcome: Progressing Goal: Diagnostic test results will improve Outcome: Progressing   Problem: Activity: Goal: Risk for activity intolerance will decrease Outcome: Progressing   Problem: Nutrition: Goal: Adequate nutrition will be maintained Outcome: Progressing   Problem: Elimination: Goal: Will not experience complications related to bowel motility Outcome: Progressing Goal: Will not experience complications related to urinary retention Outcome: Progressing   Problem: Pain Managment: Goal: General experience of comfort will improve Outcome: Progressing

## 2022-07-30 NOTE — Progress Notes (Addendum)
Patient complaining of increase pain. Scheduled IV Toradol and 1000mg  PO given as ordered. MD notified at 1735. Nurse awaiting a response.    MD response at 1741 to give the Oxycodone early. Oxycodone given as requested by the MD at 1745.

## 2022-07-30 NOTE — Progress Notes (Signed)
Patient is not able to walk the distance required to go the bathroom, or he/she is unable to safely negotiate stairs required to access the bathroom.  A 3in1 BSC will alleviate this problem  

## 2022-07-31 MED ORDER — CELECOXIB 200 MG PO CAPS
200.0000 mg | ORAL_CAPSULE | Freq: Two times a day (BID) | ORAL | Status: DC
  Administered 2022-07-31 – 2022-08-02 (×5): 200 mg via ORAL
  Filled 2022-07-31 (×5): qty 1

## 2022-07-31 NOTE — Progress Notes (Signed)
    Attending Progress Note  History: Leah [name] is here for kyphosis, myelopathy, radiculopathy  POD2: Having expected pain but otherwise mobilizing well. POD1: Doing very well.  Expected pain.  Physical Exam: Vitals:   07/31/22 0409 07/31/22 0931  BP: 131/84 132/73  Pulse: 93 98  Resp: 19 18  Temp: 98.2 F (36.8 C) 98.6 F (37 C)  SpO2: 98% 99%    AA Ox3 CNI  Strength:5/5 throughout BUE and BLE  Data:  Other tests/results:  Drains Anterior 5 Superficial 10 Deep 70  Assessment/Plan:  Leah [name] is doing very well after extensive anterior and posterior spinal reconstruction  - mobilize - pain control - DVT prophylaxis - PTOT - anterior and superficial drains removed   Venetia Night MD, [facility] Department of Neurosurgery

## 2022-07-31 NOTE — Plan of Care (Signed)

## 2022-08-01 MED ORDER — OXYCODONE HCL 5 MG PO TABS
5.0000 mg | ORAL_TABLET | ORAL | Status: DC | PRN
  Administered 2022-08-01: 5 mg via ORAL
  Filled 2022-08-01: qty 1

## 2022-08-01 MED ORDER — OXYCODONE HCL 5 MG PO TABS
10.0000 mg | ORAL_TABLET | ORAL | Status: DC | PRN
  Administered 2022-08-01 – 2022-08-02 (×7): 10 mg via ORAL
  Filled 2022-08-01 (×8): qty 2

## 2022-08-01 NOTE — Progress Notes (Signed)
    Attending Progress Note  History: Leah [name] is here for kyphosis, myelopathy, radiculopathy  POD3: Having continued pain but making progress. POD2: Having expected pain but otherwise mobilizing well. POD1: Doing very well.  Expected pain.  Physical Exam: Vitals:   08/01/22 0753 08/01/22 0818  BP: 138/86 (!) 148/98  Pulse: 93 94  Resp: 18 18  Temp: 98.6 F (37 C) 98 F (36.7 C)  SpO2: 98% 99%    AA Ox3 CNI  Strength:5/5 throughout BUE and BLE  Data:  Other tests/results:  Drains Deep 65  Assessment/Plan:  Leah [name] is doing very well after extensive anterior and posterior spinal reconstruction  - mobilize - pain control - DVT prophylaxis - PTOT - Monitor deep drain - likely to remove tomorrow   Venetia Night MD, [facility] Department of Neurosurgery

## 2022-08-01 NOTE — Progress Notes (Signed)
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 Pt is now s/p C5-T1 cervical discetomy, bilateral forinotomy, anterior fusion, and C2-T2 posterior spinal fusion. [name] has ordered a rigid cervical collar for use when up moving around.    PT Comments    Pt in bed on entry, agreeable to session. Pain control is at goal. Pt agreeable to session, goal of more indepth assessment of balance to ascertain pre/post surgical balance differential. Pt able to mobilize without use of RW without LOB, confidence generally good. After 263ft she reports balance improved compared to presurgical status. Pt able to demonstrate backwards walking, spinning in place, eyes closed balance, all without LOB and all at normative values for age-matched norms. Pt encouraged to AMB frequently, use RW as needed. RN updated on mobility status, pt now cleared to walk without assist. Pt has met all acute PT goals of care, is ready for DC to home once medically cleared. PT to signoff at this time and defer AMB to pt/NSG. Thank you for this consult.       Recommendations for follow up therapy are one component of a multi-disciplinary discharge planning process, led by the attending physician.  Recommendations may be updated based on patient status, additional functional criteria and insurance authorization.  Follow Up Recommendations       Assistance Recommended at Discharge Set up Supervision/Assistance  Patient can return home with the following Assistance with cooking/housework;Assist for transportation;A little help with bathing/dressing/bathroom;Help with stairs or ramp for entrance   Equipment Recommendations  Rolling walker (2 wheels);BSC/3in1    Recommendations for Other Services        Precautions / Restrictions Precautions Precautions: Cervical Precaution Booklet Issued: Yes (comment) Precaution Comments: Cervical spine surgery precaution handout Required Braces or Orthoses: Cervical Brace Cervical Brace: Hard collar Restrictions Weight Bearing Restrictions: No     Mobility  Bed Mobility Overal bed mobility: Independent                  Transfers Overall transfer level: Independent Equipment used: None               General transfer comment: for assessment of balance, eschewed device    Ambulation/Gait Ambulation/Gait assistance: Modified independent (Device/Increase time) Gait Distance (Feet): 280 Feet Assistive device: None Gait Pattern/deviations: WFL(Within Functional Limits)       General Gait Details: assessment of balane today, versus balance prior to admission/surgery   Stairs             Wheelchair Mobility    Modified Rankin (Stroke Patients Only)       Balance                                            Cognition Arousal/Alertness: Awake/alert Behavior During Therapy: WFL for tasks assessed/performed Overall Cognitive Status: Within Functional Limits for tasks assessed                                          Exercises Other Exercises Other Exercises: backwards AMB x47ft, narrow stance eyes open, narrow stance eyes closed,  spinning 360 degree bilat (no LOB with any of these)    General Comments        Pertinent Vitals/Pain Pain Assessment Pain Assessment: 0-10 Pain Score: 4  Pain Descriptors / Indicators: Aching Pain Intervention(s): Limited activity within patient's tolerance, Monitored during session    Home Living                          Prior Function            PT Goals (current goals can now be found in the care plan section) Acute Rehab PT Goals Patient Stated Goal: To return home safely PT Goal Formulation: With patient Time  For Goal Achievement: 08/13/22 Potential to Achieve Goals: Good Progress towards PT goals: Goals met/education completed, patient discharged from PT    Frequency    Min 2X/week      PT Plan Current plan remains appropriate    Co-evaluation              AM-PAC PT "6 Clicks" Mobility   Outcome Measure  Help needed turning from your back to your side while in a flat bed without using bedrails?: None Help needed moving from lying on your back to sitting on the side of a flat bed without using bedrails?: None Help needed moving to and from a bed to a chair (including a wheelchair)?: None Help needed standing up from a chair using your arms (e.g., wheelchair or bedside chair)?: None Help needed to walk in hospital room?: A Little Help needed climbing 3-5 steps with a railing? : A Little 6 Click Score: 22    End of Session Equipment Utilized During Treatment: Cervical collar Activity Tolerance: Patient tolerated treatment well Patient left: in bed;with nursing/sitter in room Nurse Communication: Mobility status PT Visit Diagnosis: Other (comment);Difficulty in walking, not elsewhere classified (R26.2)     Time: 1610-9604 PT Time Calculation (min) (ACUTE ONLY): 14 min  Charges:  $Therapeutic Activity: 8-22 mins                    12:49 PM, 08/01/22 Rosamaria Lints, PT, DPT Physical Therapist - [facility]  [phone] (ASCOM)     [name] C 08/01/2022, 12:46 PM

## 2022-08-01 NOTE — TOC Progression Note (Signed)
Transition of Care [facility]) - Progression Note    Patient Details  Name: Leah [name] MRN: [id number] Date of Birth: 03-07-1966  [facility] CM/SW Contact  Marlowe Sax, RN Phone Number: 08/01/2022, 10:51 AM  Clinical Narrative:   Reached out to [employer] and requested RW and 3 in 1 to be delivered to the room, OP referral for PT was sent   Expected Discharge Plan: OP Rehab Barriers to Discharge: Continued Medical Work up  Expected Discharge Plan and Services       Living arrangements for the past 2 months: Single Family Home                                       Social Determinants of Health (SDOH) Interventions SDOH Screenings   Food Insecurity: Food Insecurity Present (07/29/2022)  Housing: Medium Risk (07/29/2022)  Transportation Needs: No Transportation Needs (07/29/2022)  Utilities: At Risk (07/29/2022)  Alcohol Screen: Low Risk  (06/09/2022)  Depression (PHQ2-9): High Risk (07/20/2022)  Financial Resource Strain: High Risk (06/09/2022)  Physical Activity: Insufficiently Active (06/09/2022)  Social Connections: Socially Isolated (06/09/2022)  Stress: Stress Concern Present (06/09/2022)  Tobacco Use: Medium Risk (07/29/2022)    Readmission Risk Interventions     No data to display

## 2022-08-02 ENCOUNTER — Encounter: Payer: Self-pay | Admitting: Neurosurgery

## 2022-08-02 DIAGNOSIS — M4802 Spinal stenosis, cervical region: Secondary | ICD-10-CM | POA: Diagnosis not present

## 2022-08-02 MED ORDER — CELECOXIB 200 MG PO CAPS: 200.0000 mg | ORAL_CAPSULE | Freq: Two times a day (BID) | ORAL | 0 refills | Status: DC

## 2022-08-02 MED ORDER — SENNA 8.6 MG PO TABS: 1.0000 | ORAL_TABLET | Freq: Two times a day (BID) | ORAL | 0 refills | Status: DC

## 2022-08-02 MED ORDER — TIZANIDINE HCL 4 MG PO TABS: 4.0000 mg | ORAL_TABLET | Freq: Three times a day (TID) | ORAL | 0 refills | Status: DC | PRN

## 2022-08-02 MED ORDER — OXYCODONE HCL 5 MG PO TABS: 5.0000 mg | ORAL_TABLET | ORAL | 0 refills | Status: AC | PRN

## 2022-08-02 NOTE — TOC Progression Note (Signed)
Transition of Care [facility]) - Progression Note    Patient Details  Name: Leah [name] MRN: [id number] Date of Birth: November 10, 1965  [facility]) CM/SW Contact  Marlowe Sax, RN Phone Number: 08/02/2022, 9:52 AM  Clinical Narrative:   inquired with [employer] about the DME that thus patient needs and has not been delivered    Expected Discharge Plan: OP Rehab Barriers to Discharge: Continued Medical Work up  Expected Discharge Plan and Services       Living arrangements for the past 2 months: Single Family Home                                       Social Determinants of Health (SDOH) Interventions SDOH Screenings   Food Insecurity: Food Insecurity Present (07/29/2022)  Housing: Medium Risk (07/29/2022)  Transportation Needs: No Transportation Needs (07/29/2022)  Utilities: At Risk (07/29/2022)  Alcohol Screen: Low Risk  (06/09/2022)  Depression (PHQ2-9): High Risk (07/20/2022)  Financial Resource Strain: High Risk (06/09/2022)  Physical Activity: Insufficiently Active (06/09/2022)  Social Connections: Socially Isolated (06/09/2022)  Stress: Stress Concern Present (06/09/2022)  Tobacco Use: Medium Risk (07/29/2022)    Readmission Risk Interventions     No data to display

## 2022-08-02 NOTE — Plan of Care (Signed)
  Problem: Education: Goal: Ability to verbalize activity precautions or restrictions will improve Outcome: Progressing Goal: Knowledge of the prescribed therapeutic regimen will improve Outcome: Progressing Goal: Understanding of discharge needs will improve Outcome: Progressing   Problem: Activity: Goal: Ability to avoid complications of mobility impairment will improve Outcome: Progressing Goal: Ability to tolerate increased activity will improve Outcome: Progressing Goal: Will remain free from falls Outcome: Progressing   Problem: Bowel/Gastric: Goal: Gastrointestinal status for postoperative course will improve Outcome: Progressing   Problem: Clinical Measurements: Goal: Ability to maintain clinical measurements within normal limits will improve Outcome: Progressing Goal: Postoperative complications will be avoided or minimized Outcome: Progressing Goal: Diagnostic test results will improve Outcome: Progressing   Problem: Pain Management: Goal: Pain level will decrease Outcome: Progressing   Problem: Skin Integrity: Goal: Will show signs of wound healing Outcome: Progressing   Problem: Bladder/Genitourinary: Goal: Urinary functional status for postoperative course will improve Outcome: Progressing   Problem: Clinical Measurements: Goal: Ability to maintain clinical measurements within normal limits will improve Outcome: Progressing Goal: Will remain free from infection Outcome: Progressing Goal: Diagnostic test results will improve Outcome: Progressing Goal: Respiratory complications will improve Outcome: Progressing Goal: Cardiovascular complication will be avoided Outcome: Progressing   Problem: Activity: Goal: Risk for activity intolerance will decrease Outcome: Progressing   Problem: Safety: Goal: Ability to remain free from injury will improve Outcome: Progressing

## 2022-08-02 NOTE — Plan of Care (Signed)
  Problem: Education: Goal: Knowledge of General Education information will improve Description: Including pain rating scale, medication(s)/side effects and non-pharmacologic comfort measures Outcome: Progressing   Problem: Health Behavior/Discharge Planning: Goal: Ability to manage health-related needs will improve Outcome: Progressing   Problem: Clinical Measurements: Goal: Ability to maintain clinical measurements within normal limits will improve Outcome: Progressing Goal: Will remain free from infection Outcome: Progressing Goal: Diagnostic test results will improve Outcome: Progressing   Problem: Activity: Goal: Risk for activity intolerance will decrease Outcome: Progressing   Problem: Nutrition: Goal: Adequate nutrition will be maintained Outcome: Progressing   Problem: Coping: Goal: Level of anxiety will decrease Outcome: Progressing   Problem: Elimination: Goal: Will not experience complications related to bowel motility Outcome: Progressing Goal: Will not experience complications related to urinary retention Outcome: Progressing   Problem: Pain Managment: Goal: General experience of comfort will improve Outcome: Progressing   Problem: Skin Integrity: Goal: Risk for impaired skin integrity will decrease Outcome: Progressing   

## 2022-08-02 NOTE — Progress Notes (Signed)
    Attending Progress Note  History: Leah [name] is here for kyphosis, myelopathy, radiculopathy  POD4: Doing well POD3: Having continued pain but making progress. POD2: Having expected pain but otherwise mobilizing well. POD1: Doing very well.  Expected pain.  Physical Exam: Vitals:   08/01/22 1557 08/02/22 0154  BP: 127/79 129/79  Pulse: (!) 103 90  Resp: 17 20  Temp: 98.9 F (37.2 C) 97.8 F (36.6 C)  SpO2: 96% 96%    AA Ox3 CNI  Strength:5/5 throughout BUE and BLE  Data:  Other tests/results:  Drains Deep 40  Assessment/Plan:  Leah [name] is doing very well after extensive anterior and posterior spinal reconstruction  - mobilize - pain control - DVT prophylaxis - PTOT - Monitor deep drain   Venetia Night MD, [facility] Department of Neurosurgery

## 2022-08-02 NOTE — Discharge Summary (Signed)
Physician Discharge Summary  Patient ID: Leah [name] MRN: [id number] DOB/AGE: 1965/09/14 57 y.o.  Admit date: 07/29/2022 Discharge date: 08/02/2022  Admission Diagnoses:Principal Problem:   Cervical myelopathy (HCC) Active Problems:   Cervical stenosis of spinal canal   Spondylolisthesis of cervical region   Other secondary kyphosis, cervical region  Discharge Diagnoses:  Principal Problem:   Cervical myelopathy (HCC) Active Problems:   Cervical stenosis of spinal canal   Spondylolisthesis of cervical region   Other secondary kyphosis, cervical region   Discharged Condition: good  Hospital Course: Leah [name] was admitted to the hospital for an elective reconstruction of her cervical spine.  She tolerated this procedure well and was admitted postoperatively.  She had improvement of her pain over several days and had all of her drains removed before being cleared for discharge on postoperative day 4.  Consults: None  Significant Diagnostic Studies: radiology: X-Ray: Good placement of implants  Treatments: surgery: ACDF C5-T1 with C2-T2 posterior spinal fusion  Discharge Exam: Blood pressure 133/83, pulse (!) 109, temperature 98.3 F (36.8 C), resp. rate 20, height 5\' 3"  (1.6 m), weight 83 kg, SpO2 99 %. General appearance: alert and cooperative CNI Neck Soft 5/5 Throughout  Disposition: Discharge disposition: 01-Home or Self Care       Discharge Instructions     Discharge patient   Complete by: As directed    Discharge disposition: 01-Home or Self Care   Discharge patient date: 08/02/2022   Incentive spirometry RT   Complete by: As directed       Allergies as of 08/02/2022       Reactions   Hydrocodone-acetaminophen Hives   All over her body.   Lactose Intolerance (gi) Other (See Comments)   Bloating and GI upset   Penicillins Rash   Childhood reaction   Sulfa Antibiotics Rash   Full body rash        Medication List     STOP taking  these medications    traMADol 50 MG tablet Commonly known as: ULTRAM       TAKE these medications    acetaminophen 500 MG tablet Commonly known as: TYLENOL Take 500-1,000 mg by mouth every 6 (six) hours as needed (for pain.).   albuterol (2.5 MG/3ML) 0.083% nebulizer solution Commonly known as: PROVENTIL Take 3 mLs (2.5 mg total) by nebulization every 6 (six) hours as needed for wheezing or shortness of breath.   Ventolin HFA 108 (90 Base) MCG/ACT inhaler Generic drug: albuterol Inhale 2 puffs into the lungs every 4 (four) hours as needed for wheezing or shortness of breath.   anastrozole 1 MG tablet Commonly known as: ARIMIDEX Take 1 tablet (1 mg total) by mouth daily.   atorvastatin 40 MG tablet Commonly known as: LIPITOR Take 1 tablet (40 mg total) by mouth daily.   budesonide-formoterol 160-4.5 MCG/ACT inhaler Commonly known as: Symbicort Inhale 2 puffs into the lungs in the morning and at bedtime.   celecoxib 200 MG capsule Commonly known as: CELEBREX Take 1 capsule (200 mg total) by mouth 2 (two) times daily.   cetirizine 10 MG tablet Commonly known as: ZYRTEC Take 1 tablet (10 mg total) by mouth daily as needed for allergies.   DULoxetine 60 MG capsule Commonly known as: CYMBALTA TAKE 1 CAPSULE BY MOUTH EVERY DAY   gabapentin 300 MG capsule Commonly known as: NEURONTIN TAKE 1 CAPSULE BY MOUTH THREE TIMES A DAY What changed: See the new instructions.   meclizine 12.5 MG tablet Commonly known as: ANTIVERT  TAKE 1 TABLET BY MOUTH 3 TIMES DAILY AS NEEDED FOR DIZZINESS.   omeprazole 40 MG capsule Commonly known as: PRILOSEC Take 1 capsule (40 mg total) by mouth every evening. 30 min before food   oxyCODONE 5 MG immediate release tablet Commonly known as: Oxy IR/ROXICODONE Take 1-2 tablets (5-10 mg total) by mouth every 4 (four) hours as needed for up to 5 days for moderate pain ((score 4 to 6)).   senna 8.6 MG Tabs tablet Commonly known as:  SENOKOT Take 1 tablet (8.6 mg total) by mouth 2 (two) times daily.   tiZANidine 4 MG tablet Commonly known as: ZANAFLEX Take 1 tablet (4 mg total) by mouth every 8 (eight) hours as needed for muscle spasms. What changed:  when to take this reasons to take this   traZODone 50 MG tablet Commonly known as: DESYREL Take 1-1.5 tablets (50-75 mg total) by mouth at bedtime as needed for sleep.   valACYclovir 1000 MG tablet Commonly known as: VALTREX Take 1 tablet (1,000 mg total) by mouth daily. Suppression. BID X 5 days with outbreak as needed What changed:  when to take this reasons to take this   VITAMIN B-12 PO Take 1 tablet by mouth daily. 1000mg  daily   Vitamin D-3 125 MCG (5000 UT) Tabs Take 5,000 Units by mouth daily.               Durable Medical Equipment  (From admission, onward)           Start     Ordered   07/30/22 1132  For home use only DME Walker rolling  Once       Question Answer Comment  Walker: With 5 Inch Wheels   Patient needs a walker to treat with the following condition Difficulty in walking, not elsewhere classified      07/30/22 1133   07/30/22 1130  For home use only DME 3 n 1  Once       Comments: Pt needs to position herself over commode without using UE and she is unable to lower her self down with insufficient leg strength. A bedside commode 3 in 1 will alleviate this problem.   07/30/22 1133            Follow-up Information     Drake Leach, PA-C Follow up on 08/12/2022.   Specialty: Neurosurgery Contact information: [address] [place] [address] [phone]                 Signed: Venetia Night 08/02/2022, 5:41 PM

## 2022-08-10 ENCOUNTER — Encounter: Payer: Self-pay | Admitting: Neurosurgery

## 2022-08-10 DIAGNOSIS — Z981 Arthrodesis status: Secondary | ICD-10-CM

## 2022-08-10 DIAGNOSIS — G959 Disease of spinal cord, unspecified: Secondary | ICD-10-CM

## 2022-08-10 MED ORDER — OXYCODONE HCL 5 MG PO TABS: 5.0000 mg | ORAL_TABLET | ORAL | 0 refills | Status: DC | PRN

## 2022-08-10 NOTE — Telephone Encounter (Signed)
ACDF C5-T1 with C2-T2 posterior spinal fusion on 07/29/22.   PMP reviewed and is appropriate.   Refill for oxycodone done. Keep at 1-2 q 4 hours prn pain. Will plan to change to 1 every 4 at next refill.

## 2022-08-11 NOTE — Progress Notes (Signed)
   REFERRING PHYSICIAN:  No referring provider defined for this encounter.  DOS: 07/29/22  ACDF C5-T1 with C2-T2 posterior spinal fusion   HISTORY OF PRESENT ILLNESS: Leah [name] is 2 weeks status post ACDF C5-T1 with C2-T2 posterior spinal fusion. Given celebrex, oxycodone, and zanaflex on discharge from the hospital.   Her preop left arm pain/weakness is better. Numbness and tingling is gone. She has expected neck pain.   She is taking oxycodone, celebrex, and zanaflex.    PHYSICAL EXAMINATION:  NEUROLOGICAL:  General: In no acute distress.   Awake, alert, oriented to person, place, and time.  Pupils equal round and reactive to light.  Facial tone is symmetric.    Strength: Side Biceps Triceps Deltoid Interossei Grip Wrist Ext. Wrist Flex.  R 5 5 5 5 5 5 5   L 5 5 5 5 5 5 5    Incisions c/d/I. Anterior incision looks great.   She has erythema around the superior aspect of incision. No drainage noted. Very small open area. See pictures below. Inferior incision looks good.          Imaging:  Nothing new to review.   Assessment / Plan: Leah [name] is doing well s/p above surgery. Treatment options reviewed with patient and following plan made:   - We discussed activity escalation and I have advised the patient to lift up to 10 pounds until 6 weeks after surgery (until follow up with [name]).   - Continue with cervical collar when up and walking.  - Reviewed wound care. She will use medihoney over superior portion of posterior incision daily and cover with dry dressing. Keep posterior incision dry.  - Continue current medications including prn oxycodone, celebrex, and zanaflex.  - Reviewed with [name]. Allergy noted to sulfa and PCN. She was given ancef prior to surgery with no issues. Okay to give keflex per [name].  - Will see her back in 10 days for wound check, then she can follow up as scheduled in 4 weeks and prn.   Patient called and  we discussed keflex and 10 day follow up. She will stop keflex immediately if any issues. Reviewed red flags.   Advised to contact the office if any questions or concerns arise.   Drake Leach PA-C Dept of Neurosurgery

## 2022-08-12 ENCOUNTER — Ambulatory Visit (INDEPENDENT_AMBULATORY_CARE_PROVIDER_SITE_OTHER): Payer: Medicaid Other | Admitting: Orthopedic Surgery

## 2022-08-12 ENCOUNTER — Encounter: Payer: Self-pay | Admitting: Orthopedic Surgery

## 2022-08-12 VITALS — BP 121/70 | Ht 63.0 in | Wt 183.0 lb

## 2022-08-12 DIAGNOSIS — G959 Disease of spinal cord, unspecified: Secondary | ICD-10-CM

## 2022-08-12 DIAGNOSIS — Z09 Encounter for follow-up examination after completed treatment for conditions other than malignant neoplasm: Secondary | ICD-10-CM

## 2022-08-12 DIAGNOSIS — Z981 Arthrodesis status: Secondary | ICD-10-CM

## 2022-08-12 DIAGNOSIS — M4802 Spinal stenosis, cervical region: Secondary | ICD-10-CM

## 2022-08-12 DIAGNOSIS — M5412 Radiculopathy, cervical region: Secondary | ICD-10-CM

## 2022-08-12 DIAGNOSIS — Z4432 Encounter for fitting and adjustment of external left breast prosthesis: Secondary | ICD-10-CM | POA: Diagnosis not present

## 2022-08-12 DIAGNOSIS — M4312 Spondylolisthesis, cervical region: Secondary | ICD-10-CM

## 2022-08-12 DIAGNOSIS — C50912 Malignant neoplasm of unspecified site of left female breast: Secondary | ICD-10-CM | POA: Diagnosis not present

## 2022-08-12 DIAGNOSIS — Z9012 Acquired absence of left breast and nipple: Secondary | ICD-10-CM | POA: Diagnosis not present

## 2022-08-12 MED ORDER — CEPHALEXIN 500 MG PO CAPS
500.0000 mg | ORAL_CAPSULE | Freq: Four times a day (QID) | ORAL | 0 refills | Status: DC
Start: 2022-08-12 — End: 2022-08-16

## 2022-08-15 DIAGNOSIS — Z981 Arthrodesis status: Secondary | ICD-10-CM

## 2022-08-15 DIAGNOSIS — G959 Disease of spinal cord, unspecified: Secondary | ICD-10-CM

## 2022-08-16 MED ORDER — CLINDAMYCIN HCL 300 MG PO CAPS
300.0000 mg | ORAL_CAPSULE | Freq: Three times a day (TID) | ORAL | 0 refills | Status: AC
Start: 2022-08-16 — End: 2022-08-23

## 2022-08-18 ENCOUNTER — Other Ambulatory Visit: Payer: Self-pay | Admitting: Neurosurgery

## 2022-08-18 DIAGNOSIS — G959 Disease of spinal cord, unspecified: Secondary | ICD-10-CM

## 2022-08-18 DIAGNOSIS — Z981 Arthrodesis status: Secondary | ICD-10-CM

## 2022-08-18 NOTE — Telephone Encounter (Signed)
Pt notified of refill and drug interaction

## 2022-08-18 NOTE — Telephone Encounter (Signed)
DOS: 07/29/22  ACDF C5-T1 with C2-T2 posterior spinal fusion   Given 20 day supply of zanaflex on 08/02/22. Would be out by Monday if taking tid.   Refill of zanaflex okay and sent to her pharmacy.   Let her know she should not take valtrex (valacyclovir) with the zanaflex.

## 2022-08-19 NOTE — Progress Notes (Unsigned)
   REFERRING PHYSICIAN:  Dana Allan, Md [address] [place],  [place] [address]  DOS: 07/29/22  ACDF C5-T1 with C2-T2 posterior spinal fusion   HISTORY OF PRESENT ILLNESS: Leah [name] is status post ACDF C5-T1 with C2-T2 posterior spinal fusion.   Doing well at her first postop visit, but she had a lot of redness at posterior incision. She was to do medihoney daily with dry dressing and start keflex.   She itching with keflex and clindamycin was sent to her pharmacy to take instead.   She is here for a wound check.   She is doing great. She has expected neck pain. She is wearing her collar. She has occasional numbness and tingling in left arm. No arm pain.   She finished her clindamycin tomorrow. No drainage noted from incision.   She is taking prn oxycodone- maybe 3-4 per day. Needs a refill. She is on prn zanaflex as well.    PHYSICAL EXAMINATION:  NEUROLOGICAL:  General: In no acute distress.   Awake, alert, oriented to person, place, and time.  Pupils equal round and reactive to light.  Facial tone is symmetric.    Strength: Side Biceps Triceps Deltoid Interossei Grip Wrist Ext. Wrist Flex.  R 5 5 5 5 5 5 5   L 5 5 5 5 5 5 5    Anterior incision healing well.   Posterior incision looks much better. Open area has closed. Minimal redness noted.          Imaging:  Nothing new to review.   Assessment / Plan: Leah [name] is doing well s/p above surgery. Treatment options reviewed with patient and following plan made:   - We discussed activity escalation and I have advised the patient to lift up to 10 pounds until 6 weeks after surgery (until follow up with [name]).   - Continue with cervical collar when up and walking.  - Reviewed wound care.  - Continue current medications including prn oxycodone, celebrex, and zanaflex. Refill of oxycodone given. Directions changed to q 6 hours prn. PMP reviewed and is appropriate.  - Finish out clindamycin.   - [name] will see her back as scheduled in 4 weeks and prn.   Advised to contact the office if any questions or concerns arise.   Drake Leach PA-C Dept of Neurosurgery

## 2022-08-23 ENCOUNTER — Ambulatory Visit (INDEPENDENT_AMBULATORY_CARE_PROVIDER_SITE_OTHER): Payer: Medicaid Other | Admitting: Orthopedic Surgery

## 2022-08-23 ENCOUNTER — Encounter: Payer: Self-pay | Admitting: Orthopedic Surgery

## 2022-08-23 DIAGNOSIS — Z981 Arthrodesis status: Secondary | ICD-10-CM

## 2022-08-23 DIAGNOSIS — M5412 Radiculopathy, cervical region: Secondary | ICD-10-CM

## 2022-08-23 DIAGNOSIS — M4312 Spondylolisthesis, cervical region: Secondary | ICD-10-CM

## 2022-08-23 DIAGNOSIS — M4802 Spinal stenosis, cervical region: Secondary | ICD-10-CM

## 2022-08-23 DIAGNOSIS — G959 Disease of spinal cord, unspecified: Secondary | ICD-10-CM

## 2022-08-23 MED ORDER — OXYCODONE HCL 5 MG PO TABS
5.0000 mg | ORAL_TABLET | Freq: Four times a day (QID) | ORAL | 0 refills | Status: DC | PRN
Start: 2022-08-23 — End: 2022-09-09

## 2022-09-07 ENCOUNTER — Other Ambulatory Visit (HOSPITAL_COMMUNITY): Payer: Self-pay

## 2022-09-07 ENCOUNTER — Telehealth: Payer: Self-pay

## 2022-09-07 ENCOUNTER — Encounter: Payer: Self-pay | Admitting: Oncology

## 2022-09-07 ENCOUNTER — Other Ambulatory Visit: Payer: Self-pay

## 2022-09-07 ENCOUNTER — Encounter: Payer: Self-pay | Admitting: Student in an Organized Health Care Education/Training Program

## 2022-09-07 DIAGNOSIS — G959 Disease of spinal cord, unspecified: Secondary | ICD-10-CM

## 2022-09-07 DIAGNOSIS — M5412 Radiculopathy, cervical region: Secondary | ICD-10-CM

## 2022-09-07 DIAGNOSIS — M4802 Spinal stenosis, cervical region: Secondary | ICD-10-CM

## 2022-09-07 NOTE — Telephone Encounter (Signed)
Dr. Aundria Rud, please advise. Do you suggest an alternative inhaler to get patient by until insurance will cover Symbicort?

## 2022-09-07 NOTE — Telephone Encounter (Signed)
Let's try the following two things: 1) can we have the pharmacy check her benefits and see if they have other ICS/LABA inhalers that are cheaper for her plan (Advair HFA, Kateri Mc, Breo). 2) if above fails and her plan doesn't offer any cheaper alternatives, let's have the patient come in and get a Breztri sample to cover her for the next few weeks.

## 2022-09-07 NOTE — Telephone Encounter (Signed)
  Per Dr. Aundria Rud- can we have the pharmacy check her benefits and see if they have other ICS/LABA inhalers that are cheaper for her plan (Advair HFA, Kateri Mc, Breo).   Pharmacy team, please advise. Thanks

## 2022-09-07 NOTE — Telephone Encounter (Signed)
Please disregard this message. The patient will continue to use her Symbicort inhaler.  Nothing further needed.

## 2022-09-08 ENCOUNTER — Ambulatory Visit
Admission: RE | Admit: 2022-09-08 | Discharge: 2022-09-08 | Disposition: A | Discharge status: Home / Self Care | Payer: Medicaid Other | Attending: Neurosurgery | Admitting: Neurosurgery

## 2022-09-08 ENCOUNTER — Ambulatory Visit (INDEPENDENT_AMBULATORY_CARE_PROVIDER_SITE_OTHER): Payer: Medicaid Other | Admitting: Neurosurgery

## 2022-09-08 ENCOUNTER — Ambulatory Visit
Admission: RE | Admit: 2022-09-08 | Discharge: 2022-09-08 | Disposition: A | Discharge status: Home / Self Care | Payer: Medicaid Other | Source: Ambulatory Visit | Attending: Neurosurgery | Admitting: Neurosurgery

## 2022-09-08 VITALS — BP 106/68 | Temp 98.4°F | Ht 63.0 in | Wt 183.0 lb

## 2022-09-08 DIAGNOSIS — G959 Disease of spinal cord, unspecified: Secondary | ICD-10-CM

## 2022-09-08 DIAGNOSIS — Z09 Encounter for follow-up examination after completed treatment for conditions other than malignant neoplasm: Secondary | ICD-10-CM

## 2022-09-08 DIAGNOSIS — M4802 Spinal stenosis, cervical region: Secondary | ICD-10-CM

## 2022-09-08 DIAGNOSIS — M5412 Radiculopathy, cervical region: Secondary | ICD-10-CM | POA: Diagnosis not present

## 2022-09-08 DIAGNOSIS — Z981 Arthrodesis status: Secondary | ICD-10-CM

## 2022-09-08 DIAGNOSIS — M4312 Spondylolisthesis, cervical region: Secondary | ICD-10-CM

## 2022-09-08 DIAGNOSIS — M4012 Other secondary kyphosis, cervical region: Secondary | ICD-10-CM

## 2022-09-08 NOTE — Progress Notes (Signed)
   REFERRING PHYSICIAN:  Dana Allan, Md [address] [place],  [place] [address]  DOS: 07/29/22  ACDF C5-T1 with C2-T2 posterior spinal fusion   HISTORY OF PRESENT ILLNESS: Rexanne Inocencio is status post ACDF C5-T1 with C2-T2 posterior spinal fusion.   She is doing very well.  She has mild pain only.  Her alignment is much better.  She is very pleased.   PHYSICAL EXAMINATION:  NEUROLOGICAL:  General: In no acute distress.   Awake, alert, oriented to person, place, and time.  Pupils equal round and reactive to light.  Facial tone is symmetric.    Strength: Side Biceps Triceps Deltoid Interossei Grip Wrist Ext. Wrist Flex.  R 5 5 5 5 5 5 5   L 5 5 5 5 5 5 5    Anterior incision healing well.   Posterior incision c/d/i    Imaging:  No complications noted  Assessment / Plan: Freeda Spivey is doing well s/p above surgery.   Her pain is much improved compared to preoperatively.  I am very pleased with her improvements.  We reviewed her activity limitations.  Will see her back in 6 weeks.  Venetia Night Dept of Neurosurgery

## 2022-09-09 ENCOUNTER — Encounter: Payer: Self-pay | Admitting: Neurosurgery

## 2022-09-09 DIAGNOSIS — Z981 Arthrodesis status: Secondary | ICD-10-CM

## 2022-09-09 DIAGNOSIS — G959 Disease of spinal cord, unspecified: Secondary | ICD-10-CM

## 2022-09-09 MED ORDER — OXYCODONE HCL 5 MG PO TABS
5.0000 mg | ORAL_TABLET | Freq: Two times a day (BID) | ORAL | 0 refills | Status: DC | PRN
Start: 2022-09-09 — End: 2022-10-18

## 2022-09-09 NOTE — Telephone Encounter (Signed)
DOS: 07/29/22 ACDF C5-T1 with C2-T2 posterior spinal fusion   PMP reviewed and is appropriate.   One last refill of oxycodone is okay. No further refills will be given.

## 2022-09-09 NOTE — Telephone Encounter (Signed)
Patty- do you know what her return to work date is?

## 2022-09-09 NOTE — Telephone Encounter (Signed)
DOS: 07/29/22 ACDF C5-T1 with C2-T2 posterior spinal fusion

## 2022-09-09 NOTE — Addendum Note (Signed)
Addended byDrake Leach on: 09/09/2022 01:25 PM   Modules accepted: Orders

## 2022-09-13 NOTE — Telephone Encounter (Signed)
Leah [name], please see new letter in chart. She said [employer] needs a new copy.

## 2022-09-19 ENCOUNTER — Telehealth: Payer: Self-pay

## 2022-09-19 DIAGNOSIS — E785 Hyperlipidemia, unspecified: Secondary | ICD-10-CM

## 2022-09-19 MED ORDER — ATORVASTATIN CALCIUM 40 MG PO TABS
40.0000 mg | ORAL_TABLET | Freq: Every day | ORAL | 3 refills | Status: DC
Start: 2022-09-19 — End: 2023-09-04

## 2022-09-19 NOTE — Telephone Encounter (Signed)
Refill sent.

## 2022-09-19 NOTE — Telephone Encounter (Signed)
I called patient to reschedule her appointment with Dr. Dana Allan and she requested a medication refill.  Prescription Request  09/19/2022  LOV: Visit date not found  What is the name of the medication or equipment? atorvastatin (LIPITOR) 40 MG tablet  Have you contacted your pharmacy to request a refill? No   Which pharmacy would you like this sent to?  [id number] [place], [place] - [address] [place] [address] Phone: [phone] Fax: [phone]    Patient notified that their request is being sent to the clinical staff for review and that they should receive a response within 2 business days.   Please advise at Mobile [phone] (mobile)  Patient states she has been out of the medication for a couple of days.

## 2022-09-21 ENCOUNTER — Encounter: Payer: Medicaid Other | Admitting: Family Medicine

## 2022-09-21 ENCOUNTER — Other Ambulatory Visit: Payer: Self-pay | Admitting: Oncology

## 2022-09-28 ENCOUNTER — Encounter: Payer: Self-pay | Admitting: Family Medicine

## 2022-09-28 DIAGNOSIS — J0101 Acute recurrent maxillary sinusitis: Secondary | ICD-10-CM

## 2022-09-29 MED ORDER — CETIRIZINE HCL 10 MG PO TABS
10.0000 mg | ORAL_TABLET | Freq: Every day | ORAL | 3 refills | Status: DC | PRN
Start: 2022-09-29 — End: 2023-11-08

## 2022-10-03 NOTE — Telephone Encounter (Signed)
DOS: 07/29/22 ACDF C5-T1 with C2-T2 posterior spinal fusion

## 2022-10-04 ENCOUNTER — Other Ambulatory Visit: Payer: Self-pay

## 2022-10-04 DIAGNOSIS — G959 Disease of spinal cord, unspecified: Secondary | ICD-10-CM

## 2022-10-04 DIAGNOSIS — M4802 Spinal stenosis, cervical region: Secondary | ICD-10-CM

## 2022-10-04 DIAGNOSIS — M5412 Radiculopathy, cervical region: Secondary | ICD-10-CM

## 2022-10-18 ENCOUNTER — Encounter: Payer: Self-pay | Admitting: Neurosurgery

## 2022-10-18 ENCOUNTER — Ambulatory Visit: Payer: Medicaid Other | Admitting: Neurosurgery

## 2022-10-18 ENCOUNTER — Ambulatory Visit
Admission: RE | Admit: 2022-10-18 | Discharge: 2022-10-18 | Disposition: A | Discharge status: Home / Self Care | Payer: Medicaid Other | Source: Ambulatory Visit | Attending: Neurosurgery | Admitting: Neurosurgery

## 2022-10-18 ENCOUNTER — Encounter: Payer: Medicaid Other | Admitting: Orthopedic Surgery

## 2022-10-18 ENCOUNTER — Ambulatory Visit
Admission: RE | Admit: 2022-10-18 | Discharge: 2022-10-18 | Disposition: A | Discharge status: Home / Self Care | Payer: Medicaid Other | Attending: Neurosurgery | Admitting: Neurosurgery

## 2022-10-18 VITALS — BP 132/82 | Ht 63.0 in | Wt 183.0 lb

## 2022-10-18 DIAGNOSIS — G959 Disease of spinal cord, unspecified: Secondary | ICD-10-CM | POA: Insufficient documentation

## 2022-10-18 DIAGNOSIS — M4802 Spinal stenosis, cervical region: Secondary | ICD-10-CM | POA: Diagnosis not present

## 2022-10-18 DIAGNOSIS — M4322 Fusion of spine, cervical region: Secondary | ICD-10-CM | POA: Diagnosis not present

## 2022-10-18 DIAGNOSIS — M5412 Radiculopathy, cervical region: Secondary | ICD-10-CM

## 2022-10-18 DIAGNOSIS — Z09 Encounter for follow-up examination after completed treatment for conditions other than malignant neoplasm: Secondary | ICD-10-CM

## 2022-10-18 DIAGNOSIS — Z4789 Encounter for other orthopedic aftercare: Secondary | ICD-10-CM | POA: Diagnosis not present

## 2022-10-18 DIAGNOSIS — Z981 Arthrodesis status: Secondary | ICD-10-CM

## 2022-10-18 NOTE — Progress Notes (Signed)
   REFERRING PHYSICIAN:  Dana Allan, Md [address] [place],  [place] [address]  DOS: 07/29/22  ACDF C5-T1 with C2-T2 posterior spinal fusion   HISTORY OF PRESENT ILLNESS:  10/18/22 Caro Gebert presents today for 3 month follow up of cervical fusion. Overall she is doing incredibly well and is pleased with her postoperative recovery thus far.  She has returned to work and admits to some intermittent soreness in her neck overall is tolerating this well.  She has no concerns today.   09/08/22 Deboraha Sprang is status post ACDF C5-T1 with C2-T2 posterior spinal fusion.   She is doing very well.  She has mild pain only.  Her alignment is much better.  She is very pleased.   PHYSICAL EXAMINATION:  NEUROLOGICAL:  General: In no acute distress.   Awake, alert, oriented to person, place, and time.  Pupils equal round and reactive to light.  Facial tone is symmetric.    Strength: Side Biceps Triceps Deltoid Interossei Grip Wrist Ext. Wrist Flex.  R 5 5 5 5 5 5 5   L 5 5 5 5 5 5 5    Posterior incisions well-healed   Imaging:  10/18/22 cervical xrays Without evidence of hardware malfunction  Assessment / Plan: Joen Hevner is doing well from her cervical fusion.  She has no additional concerns today.  Her x-rays are reassuring.  I recommended that she resume activities as tolerated which she admits she is already done and tolerated well.  We will see her back in 6 months with cervical x-rays prior or sooner should she have any questions or concerns.  She expressed understanding and was in agreement with this plan.  I spent a total of 20 minutes in both face-to-face and non-face-to-face activities for this visit on the date of this encounter.   Manning Charity PA-C Dept of Neurosurgery

## 2022-10-19 ENCOUNTER — Ambulatory Visit (INDEPENDENT_AMBULATORY_CARE_PROVIDER_SITE_OTHER): Payer: Medicaid Other | Admitting: Family Medicine

## 2022-10-19 ENCOUNTER — Encounter: Payer: Self-pay | Admitting: Family Medicine

## 2022-10-19 VITALS — BP 124/76 | HR 87 | Temp 97.9°F | Resp 16 | Ht 63.0 in | Wt 186.4 lb

## 2022-10-19 DIAGNOSIS — F39 Unspecified mood [affective] disorder: Secondary | ICD-10-CM

## 2022-10-19 DIAGNOSIS — M25561 Pain in right knee: Secondary | ICD-10-CM

## 2022-10-19 DIAGNOSIS — R600 Localized edema: Secondary | ICD-10-CM | POA: Diagnosis not present

## 2022-10-19 DIAGNOSIS — M8580 Other specified disorders of bone density and structure, unspecified site: Secondary | ICD-10-CM | POA: Diagnosis not present

## 2022-10-19 DIAGNOSIS — Z0000 Encounter for general adult medical examination without abnormal findings: Secondary | ICD-10-CM | POA: Diagnosis not present

## 2022-10-19 MED ORDER — APIXABAN (ELIQUIS) VTE STARTER PACK (10MG AND 5MG)
ORAL_TABLET | ORAL | Status: DC
Start: 2022-10-19 — End: 2022-11-05

## 2022-10-19 NOTE — Progress Notes (Unsigned)
SUBJECTIVE:   Chief Complaint  Patient presents with   Annual Exam   HPI Presents to clinic for annual exam  Concern for right lower knee pain Would like referral to Orthopedics for possible injection Has had injection in past in same knee but has had increase in swelling.  Recently had neck  surgery and reports had not had any VTE prophylaxis during hospitalization.  Given recent surgery, lower extremity edema will obtain u/s of lower extremities and start on eliquis.  If negative can discontinue and refer to orthopedics for evaluation.  No recent falls  Mood disorder Denies SI/HI.  Doing well on Cymbalta and Trazodone.    HLD Taking Lipitor 40 mg daily and tolerating well The 10-year ASCVD risk score (Arnett DK, et al., 2019) is: 1.8%   H/O Breast Ca Currently on Arimidex.  Reports also receiving reclast infusions.  Had Dexa scan showing worsening osteopenia.  Follows with Dr Smith Robert, oncology.     PERTINENT PMH / PSH: Arthritis Asthma HTN H/O Breast Ca   OBJECTIVE:  BP 124/76   Pulse 87   Temp 97.9 F (36.6 C)   Resp 16   Ht 5\' 3"  (1.6 m)   Wt 186 lb 6 oz (84.5 kg)   SpO2 96%   BMI 33.01 kg/m    Physical Exam Vitals reviewed.  Constitutional:      General: She is not in acute distress.    Appearance: She is not ill-appearing.  HENT:     Head: Normocephalic.     Right Ear: Tympanic membrane, ear canal and external ear normal.     Left Ear: Tympanic membrane, ear canal and external ear normal.     Nose: Nose normal.     Mouth/Throat:     Mouth: Mucous membranes are moist.  Eyes:     Extraocular Movements: Extraocular movements intact.     Conjunctiva/sclera: Conjunctivae normal.     Pupils: Pupils are equal, round, and reactive to light.  Neck:     Thyroid: No thyromegaly or thyroid tenderness.     Vascular: No carotid bruit.  Cardiovascular:     Rate and Rhythm: Normal rate and regular rhythm.     Pulses: Normal pulses.     Heart sounds: Normal  heart sounds.  Pulmonary:     Effort: Pulmonary effort is normal.     Breath sounds: Normal breath sounds.  Abdominal:     General: Bowel sounds are normal. There is no distension.     Palpations: Abdomen is soft.     Tenderness: There is no abdominal tenderness. There is no right CVA tenderness, left CVA tenderness, guarding or rebound.  Musculoskeletal:        General: Swelling and tenderness present. Normal range of motion.     Cervical back: Normal range of motion.     Right lower leg: Edema present.     Left lower leg: No edema.  Lymphadenopathy:     Cervical: No cervical adenopathy.  Skin:    Capillary Refill: Capillary refill takes less than 2 seconds.  Neurological:     General: No focal deficit present.     Mental Status: She is alert and oriented to person, place, and time. Mental status is at baseline.     Motor: No weakness.  Psychiatric:        Mood and Affect: Mood normal.        Behavior: Behavior normal.        Thought Content: Thought content  normal.        Judgment: Judgment normal.        10/19/2022    1:04 PM 07/20/2022   10:50 AM 06/13/2022    8:45 AM 04/05/2022    2:44 PM 11/23/2021    8:23 AM  Depression screen PHQ 2/9  Decreased Interest 1 2 2  0   Down, Depressed, Hopeless 2 2 2  0   PHQ - 2 Score 3 4 4  0   Altered sleeping 3 3 3     Tired, decreased energy 1 3 1     Change in appetite 2 3 2     Feeling bad or failure about yourself  1 3 2     Trouble concentrating 2 2 3     Moving slowly or fidgety/restless 0 2 1    Suicidal thoughts 0 0 0    PHQ-9 Score 12 20 16     Difficult doing work/chores Somewhat difficult Somewhat difficult Somewhat difficult       Information is confidential and restricted. Go to Review Flowsheets to unlock data.      10/19/2022    1:04 PM 07/20/2022   10:51 AM 06/13/2022    8:46 AM 11/23/2021    8:25 AM  GAD 7 : Generalized Anxiety Score  Nervous, Anxious, on Edge 3 3 2    Control/stop worrying 3 3 2    Worry too much -  different things 3 3 2    Trouble relaxing 3 3 1    Restless 2 2 1    Easily annoyed or irritable 0 1 1   Afraid - awful might happen 3 3 2    Total GAD 7 Score 17 18 11    Anxiety Difficulty Somewhat difficult Somewhat difficult Somewhat difficult      Information is confidential and restricted. Go to Review Flowsheets to unlock data.     ASSESSMENT/PLAN:  Annual physical exam Assessment & Plan: Mammogram annually, UTD due 11/2022 Recommend regular self breast exams.  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 Due 11/2022 Currently on Reclast IV infusion biannually.  Follows with Oncology Normotensive Vaccines up to date PAP due 08/2024 Hep C/HIV screening completed     Edema of right lower extremity Assessment & Plan: Acute lower extremity edema right >left Wells score moderate risk Start Eliquis 10 mg BID x 7 days then 5 mg BID.  Sample pack provided. Obtain lower extremity dopplers r/o DVT If negative can discontinue anticoagulation. Can refer to Ortho once imaging completed. Discontinue Celebrex while on Eliquis.  Patient reports not taking NSAID. Strict return precautions provided.  Orders: -     VAS [place] LOWER EXTREMITY VENOUS (DVT); Future  Right knee pain, unspecified chronicity Assessment & Plan: Chronic Likely OA Plan for referral to Ortho for evaluation and possible injection if u/s negative for DVT   Orders: -     Ambulatory referral to Orthopedics  Mood disorder [facility]) Assessment & Plan: Chronic.  PHQ9/GAD stable.  Denies any SI/HI. Taking Duloxetine 60 mg daily.  Continue CBT with therapist Follow-up as needed   Osteopenia due to cancer therapy Assessment & Plan: On Arimidex Receiving Reclast infusions biannually Recent Dexa showing worsening osteopenia Follows with Oncology for management   Orders: -     zoledronic acid 4 mg in sodium chloride 0.9 % 100 mL; Inject 4 mg into the vein once for 1 dose.   PDMP  reviewed  Return if symptoms worsen or fail to improve.  Dana Allan, MD

## 2022-10-19 NOTE — Progress Notes (Signed)
  Medication Samples have been provided to the patient.  Drug name: Eliquis       Strength: 5mg         Qty: 14  LOT: [id number]  Exp.Date: 16109604  Dosing instructions: start with two-5mg  tablets twice daily for 7 days. On day 8, switch to one-5mg  tablet twice daily.   The patient has been instructed regarding the correct time, dose, and frequency of taking this medication, including desired effects and most common side effects.   Leah [name]   1:46 PM 10/19/2022

## 2022-10-19 NOTE — Patient Instructions (Addendum)
It was a pleasure meeting you today. Thank you for allowing me to take part in your health care.  Our goals for today as we discussed include:  Ultrasound right lower leg to rule out blood clot Start Eliquis 10 mg two times a day for 7 days, on day 8 start 5 mg two times a day.  If ultrasound negative will notify you to discontinue medication. Sample pack has 14 tablets.  Take 2 tablets 2 times a day.  If needing to continue will send in refill.  Follow up with OBGYN for PAP    If you have any questions or concerns, please do not hesitate to call the office at [phone].  I look forward to our next visit and until then take care and stay safe.  Regards,   Dana Allan, MD   [facility]

## 2022-10-20 ENCOUNTER — Ambulatory Visit: Payer: Medicaid Other | Attending: Family Medicine

## 2022-10-20 DIAGNOSIS — R600 Localized edema: Secondary | ICD-10-CM

## 2022-10-21 ENCOUNTER — Encounter: Payer: Self-pay | Admitting: Family Medicine

## 2022-10-25 DIAGNOSIS — S86911A Strain of unspecified muscle(s) and tendon(s) at lower leg level, right leg, initial encounter: Secondary | ICD-10-CM | POA: Diagnosis not present

## 2022-11-05 ENCOUNTER — Encounter: Payer: Self-pay | Admitting: Family Medicine

## 2022-11-05 DIAGNOSIS — M8580 Other specified disorders of bone density and structure, unspecified site: Secondary | ICD-10-CM | POA: Insufficient documentation

## 2022-11-05 DIAGNOSIS — M25561 Pain in right knee: Secondary | ICD-10-CM | POA: Insufficient documentation

## 2022-11-05 DIAGNOSIS — R600 Localized edema: Secondary | ICD-10-CM | POA: Insufficient documentation

## 2022-11-05 MED ORDER — ZOLEDRONIC ACID 4 MG/5ML IV CONC
4.0000 mg | Freq: Once | INTRAVENOUS | Status: AC
Start: 2022-11-05 — End: 2022-11-05

## 2022-11-05 NOTE — Assessment & Plan Note (Signed)
Acute lower extremity edema right >left Wells score moderate risk Start Eliquis 10 mg BID x 7 days then 5 mg BID.  Sample pack provided. Obtain lower extremity dopplers r/o DVT If negative can discontinue anticoagulation. Can refer to Ortho once imaging completed. Discontinue Celebrex while on Eliquis.  Patient reports not taking NSAID. Strict return precautions provided.

## 2022-11-05 NOTE — Assessment & Plan Note (Signed)
On Arimidex Receiving Reclast infusions biannually Recent Dexa showing worsening osteopenia Follows with Oncology for management

## 2022-11-05 NOTE — Assessment & Plan Note (Signed)
Chronic Likely OA Plan for referral to Ortho for evaluation and possible injection if u/s negative for DVT

## 2022-11-05 NOTE — Assessment & Plan Note (Signed)
Chronic.  PHQ9/GAD stable.  Denies any SI/HI. Taking Duloxetine 60 mg daily.  Continue CBT with therapist Follow-up as needed

## 2022-11-05 NOTE — Assessment & Plan Note (Signed)
Mammogram annually, UTD due 11/2022 Recommend regular self breast exams.  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 Due 11/2022 Currently on Reclast IV infusion biannually.  Follows with Oncology Normotensive Vaccines up to date PAP due 08/2024 Hep C/HIV screening completed

## 2022-11-15 ENCOUNTER — Ambulatory Visit
Admission: RE | Admit: 2022-11-15 | Discharge: 2022-11-15 | Disposition: A | Discharge status: Home / Self Care | Payer: Medicaid Other | Source: Ambulatory Visit | Attending: Medical Oncology | Admitting: Medical Oncology

## 2022-11-15 DIAGNOSIS — M85852 Other specified disorders of bone density and structure, left thigh: Secondary | ICD-10-CM | POA: Diagnosis not present

## 2022-11-15 DIAGNOSIS — M069 Rheumatoid arthritis, unspecified: Secondary | ICD-10-CM | POA: Insufficient documentation

## 2022-11-15 DIAGNOSIS — Z9221 Personal history of antineoplastic chemotherapy: Secondary | ICD-10-CM | POA: Insufficient documentation

## 2022-11-15 DIAGNOSIS — Z780 Asymptomatic menopausal state: Secondary | ICD-10-CM | POA: Diagnosis not present

## 2022-11-15 DIAGNOSIS — J45909 Unspecified asthma, uncomplicated: Secondary | ICD-10-CM | POA: Insufficient documentation

## 2022-11-15 DIAGNOSIS — Z13820 Encounter for screening for osteoporosis: Secondary | ICD-10-CM | POA: Diagnosis not present

## 2022-11-15 DIAGNOSIS — M8580 Other specified disorders of bone density and structure, unspecified site: Secondary | ICD-10-CM | POA: Insufficient documentation

## 2022-11-15 DIAGNOSIS — Z923 Personal history of irradiation: Secondary | ICD-10-CM | POA: Diagnosis not present

## 2022-11-15 DIAGNOSIS — Z79811 Long term (current) use of aromatase inhibitors: Secondary | ICD-10-CM | POA: Insufficient documentation

## 2022-11-16 DIAGNOSIS — M25561 Pain in right knee: Secondary | ICD-10-CM | POA: Diagnosis not present

## 2022-11-28 ENCOUNTER — Inpatient Hospital Stay: Payer: Medicaid Other

## 2022-11-28 ENCOUNTER — Encounter: Payer: Self-pay | Admitting: Family Medicine

## 2022-11-28 ENCOUNTER — Inpatient Hospital Stay (HOSPITAL_BASED_OUTPATIENT_CLINIC_OR_DEPARTMENT_OTHER): Payer: Medicaid Other | Admitting: Oncology

## 2022-11-28 ENCOUNTER — Inpatient Hospital Stay: Payer: Medicaid Other | Attending: Oncology

## 2022-11-28 ENCOUNTER — Other Ambulatory Visit: Payer: Self-pay | Admitting: *Deleted

## 2022-11-28 ENCOUNTER — Ambulatory Visit
Admission: RE | Admit: 2022-11-28 | Discharge: 2022-11-28 | Disposition: A | Discharge status: Home / Self Care | Payer: Medicaid Other | Source: Ambulatory Visit | Attending: Medical Oncology | Admitting: Medical Oncology

## 2022-11-28 ENCOUNTER — Encounter: Payer: Self-pay | Admitting: Oncology

## 2022-11-28 VITALS — BP 130/81 | HR 89 | Temp 97.8°F | Resp 16 | Ht 63.0 in | Wt 186.6 lb

## 2022-11-28 DIAGNOSIS — Z08 Encounter for follow-up examination after completed treatment for malignant neoplasm: Secondary | ICD-10-CM

## 2022-11-28 DIAGNOSIS — C50919 Malignant neoplasm of unspecified site of unspecified female breast: Secondary | ICD-10-CM | POA: Diagnosis not present

## 2022-11-28 DIAGNOSIS — Z1231 Encounter for screening mammogram for malignant neoplasm of breast: Secondary | ICD-10-CM | POA: Diagnosis not present

## 2022-11-28 DIAGNOSIS — R5383 Other fatigue: Secondary | ICD-10-CM | POA: Insufficient documentation

## 2022-11-28 DIAGNOSIS — Z5181 Encounter for therapeutic drug level monitoring: Secondary | ICD-10-CM

## 2022-11-28 DIAGNOSIS — R92333 Mammographic heterogeneous density, bilateral breasts: Secondary | ICD-10-CM | POA: Diagnosis not present

## 2022-11-28 DIAGNOSIS — R928 Other abnormal and inconclusive findings on diagnostic imaging of breast: Secondary | ICD-10-CM | POA: Diagnosis not present

## 2022-11-28 DIAGNOSIS — C50212 Malignant neoplasm of upper-inner quadrant of left female breast: Secondary | ICD-10-CM | POA: Diagnosis not present

## 2022-11-28 DIAGNOSIS — Z79624 Long term (current) use of inhibitors of nucleotide synthesis: Secondary | ICD-10-CM | POA: Diagnosis not present

## 2022-11-28 DIAGNOSIS — Z170 Estrogen receptor positive status [ER+]: Secondary | ICD-10-CM | POA: Diagnosis not present

## 2022-11-28 DIAGNOSIS — Z9221 Personal history of antineoplastic chemotherapy: Secondary | ICD-10-CM | POA: Insufficient documentation

## 2022-11-28 DIAGNOSIS — Z87891 Personal history of nicotine dependence: Secondary | ICD-10-CM | POA: Diagnosis not present

## 2022-11-28 DIAGNOSIS — Z923 Personal history of irradiation: Secondary | ICD-10-CM | POA: Insufficient documentation

## 2022-11-28 DIAGNOSIS — Z79899 Other long term (current) drug therapy: Secondary | ICD-10-CM | POA: Insufficient documentation

## 2022-11-28 DIAGNOSIS — Z7951 Long term (current) use of inhaled steroids: Secondary | ICD-10-CM | POA: Insufficient documentation

## 2022-11-28 DIAGNOSIS — C50912 Malignant neoplasm of unspecified site of left female breast: Secondary | ICD-10-CM

## 2022-11-28 DIAGNOSIS — Z7983 Long term (current) use of bisphosphonates: Secondary | ICD-10-CM | POA: Diagnosis not present

## 2022-11-28 DIAGNOSIS — M8580 Other specified disorders of bone density and structure, unspecified site: Secondary | ICD-10-CM | POA: Insufficient documentation

## 2022-11-28 DIAGNOSIS — D751 Secondary polycythemia: Secondary | ICD-10-CM | POA: Insufficient documentation

## 2022-11-28 DIAGNOSIS — Z79811 Long term (current) use of aromatase inhibitors: Secondary | ICD-10-CM | POA: Insufficient documentation

## 2022-11-28 LAB — CBC WITH DIFFERENTIAL/PLATELET
Abs Immature Granulocytes: 0.03 10*3/uL (ref 0.00–0.07)
Basophils Absolute: 0.1 10*3/uL (ref 0.0–0.1)
Basophils Relative: 1 %
Eosinophils Absolute: 0.4 10*3/uL (ref 0.0–0.5)
Eosinophils Relative: 4 %
HCT: 40.7 % (ref 36.0–46.0)
Hemoglobin: 13.5 g/dL (ref 12.0–15.0)
Immature Granulocytes: 0 %
Lymphocytes Relative: 14 %
Lymphs Abs: 1.4 10*3/uL (ref 0.7–4.0)
MCH: 29.7 pg (ref 26.0–34.0)
MCHC: 33.2 g/dL (ref 30.0–36.0)
MCV: 89.6 fL (ref 80.0–100.0)
Monocytes Absolute: 0.7 10*3/uL (ref 0.1–1.0)
Monocytes Relative: 7 %
Neutro Abs: 7.5 10*3/uL (ref 1.7–7.7)
Neutrophils Relative %: 74 %
Platelets: 373 10*3/uL (ref 150–400)
RBC: 4.54 MIL/uL (ref 3.87–5.11)
RDW: 12.6 % (ref 11.5–15.5)
WBC: 10 10*3/uL (ref 4.0–10.5)
nRBC: 0 % (ref 0.0–0.2)

## 2022-11-28 LAB — COMPREHENSIVE METABOLIC PANEL
ALT: 22 U/L (ref 0–44)
AST: 19 U/L (ref 15–41)
Albumin: 3.7 g/dL (ref 3.5–5.0)
Alkaline Phosphatase: 65 U/L (ref 38–126)
Anion gap: 5 (ref 5–15)
BUN: 18 mg/dL (ref 6–20)
CO2: 29 mmol/L (ref 22–32)
Calcium: 9.5 mg/dL (ref 8.9–10.3)
Chloride: 103 mmol/L (ref 98–111)
Creatinine, Ser: 0.67 mg/dL (ref 0.44–1.00)
GFR, Estimated: >60 mL/min (ref >60)
Glucose, Bld: 87 mg/dL (ref 70–99)
Potassium: 4 mmol/L (ref 3.5–5.1)
Sodium: 137 mmol/L (ref 135–145)
Total Bilirubin: 0.6 mg/dL (ref 0.3–1.2)
Total Protein: 7 g/dL (ref 6.5–8.1)

## 2022-11-28 LAB — FOLATE: Folate: 8.8 ng/mL (ref >5.9)

## 2022-11-28 LAB — IRON AND TIBC
Iron: 42 ug/dL (ref 28–170)
Saturation Ratios: 12 % (ref 10.4–31.8)
TIBC: 356 ug/dL (ref 250–450)
UIBC: 314 ug/dL

## 2022-11-28 LAB — FERRITIN: Ferritin: 10 ng/mL — ABNORMAL LOW (ref 11–307)

## 2022-11-28 LAB — TSH: TSH: 1.43 u[IU]/mL (ref 0.350–4.500)

## 2022-11-28 MED ORDER — SODIUM CHLORIDE 0.9 % IV SOLN
Freq: Once | INTRAVENOUS | Status: AC
  Filled 2022-11-28: qty 250

## 2022-11-28 MED ORDER — ANASTROZOLE 1 MG PO TABS: 1.0000 mg | ORAL_TABLET | Freq: Every day | ORAL | 2 refills | Status: DC

## 2022-11-28 MED ORDER — DULOXETINE HCL 60 MG PO CPEP: ORAL_CAPSULE | ORAL | 2 refills | Status: DC

## 2022-11-28 MED ORDER — ZOLEDRONIC ACID 4 MG/100ML IV SOLN
4.0000 mg | INTRAVENOUS | Status: DC
  Administered 2022-11-28: 4 mg via INTRAVENOUS
  Filled 2022-11-28: qty 100

## 2022-11-28 NOTE — Progress Notes (Signed)
Hematology/Oncology Consult note [facility]  Telephone:([phone] Fax:([phone]  Patient Care Team: Dana Allan, MD as PCP - General (Family Medicine) Griselda Miner, MD as Consulting Physician (General Surgery) Carmina Miller, MD as Referring Physician (Radiation Oncology) Jim Like, RN as Registered Nurse Creig Hines, MD as Consulting Physician (Oncology)   Name of the patient: Leah [name]  [id number]  09/21/1965   Date of visit: 11/28/22  Diagnosis- pathological prognostic stage Ia invasive mammary carcinoma pT1b pN1 cM0 ER/PR positive HER-2/neu negative s/p lumpectomy, adjuvant chemotherapy and radiation currently on Arimidex   Chief complaint/ Reason for visit-routine follow-up of breast cancer on Arimidex  Heme/Onc history: Patient is a 57 year old postmenopausal female G2 P2 L2 who  underwent screening bilateral mammogram which showed area of concern in her left breast.  This was followed by an ultrasound and Core biopsy.  Ultrasound showed 1 x 1.1 x 0.7 cm hypoechoic mass at the 10:30 position of the left breast.  Left axilla appeared normal.  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    Final pathology showed 9 mm grade 1 invasive mammary carcinoma 1 out of 2 lymph nodes involved with macro metastatic carcinoma 3.5 mm.  Anterior margin was focally positive for which patient underwent reexcision surgery with negative margins [id number] (sn)   MammaPrint score came back as high risk with a risk of recurrence of 29% at 10 years without chemotherapy   Patient completed 4 cycles of dose dense AC chemotherapy and 12 cycles of weekly Taxol.  She was started on letrozole in August 2021 but could not tolerate it due to diarrhea and is currently on Arimidex  Interval history-tolerating Arimidex wellWithout any significant side effects.  She does report some increasing  neuropathy in her feet.  She is presently on 600 mg twice daily of gabapentin.  Denies any breast concerns.  Reports worsening fatigue in the last 1 month  ECOG PS- 1 Pain scale- 0   Review of systems- Review of Systems  Constitutional:  Positive for malaise/fatigue. Negative for chills, fever and weight loss.  HENT:  Negative for congestion, ear discharge and nosebleeds.   Eyes:  Negative for blurred vision.  Respiratory:  Negative for cough, hemoptysis, sputum production, shortness of breath and wheezing.   Cardiovascular:  Negative for chest pain, palpitations, orthopnea and claudication.  Gastrointestinal:  Negative for abdominal pain, blood in stool, constipation, diarrhea, heartburn, melena, nausea and vomiting.  Genitourinary:  Negative for dysuria, flank pain, frequency, hematuria and urgency.  Musculoskeletal:  Negative for back pain, joint pain and myalgias.  Skin:  Negative for rash.  Neurological:  Negative for dizziness, tingling, focal weakness, seizures, weakness and headaches.  Endo/Heme/Allergies:  Does not bruise/bleed easily.  Psychiatric/Behavioral:  Negative for depression and suicidal ideas. The patient does not have insomnia.       Allergies  Allergen Reactions   Hydrocodone-Acetaminophen Hives    All over her body.   Lactose Intolerance (Gi) Other (See Comments)    Bloating and GI upset   Cephalexin Itching   Penicillins Rash    Childhood reaction    Sulfa Antibiotics Rash    Full body rash      Past Medical History:  Diagnosis Date   Anxiety    Arthritis    Asthma    as a child   Breast cancer (HCC)    Carpal tunnel syndrome    R  hand    Colon polyps    Depression    Dyspnea    Eosinophilic esophagitis    noted in [place] with GI path report 09/29/14    Esophageal stricture    s/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 Raul Del    Family history of breast cancer    Family history of prostate cancer    Gastroesophageal  reflux disease without esophagitis 04/09/2022   Herniated disc, cervical    s/p epidural injection   Herpes    History of kidney stones    Hyperlipidemia    Low back pain    Migraines    Personal history of chemotherapy    Personal history of radiation therapy    Pneumonia      Past Surgical History:  Procedure Laterality Date   ANTERIOR CERVICAL DECOMP/DISCECTOMY FUSION N/A 07/29/2022   Procedure: C5-T1 ANTERIOR CERVICAL DISCECTOMY AND FUSION;  Surgeon: Venetia Night, MD;  Location: [facility] ORS;  Service: Neurosurgery;  Laterality: N/A;   APPLICATION OF INTRAOPERATIVE CT SCAN N/A 07/29/2022   Procedure: APPLICATION OF INTRAOPERATIVE CT SCAN;  Surgeon: Venetia Night, MD;  Location: [facility];  Service: Neurosurgery;  Laterality: N/A;   BREAST BIOPSY Left 12/25/2018   [place] BX, invasive mammary carcinoma    BREAST LUMPECTOMY     BREAST LUMPECTOMY WITH NEEDLE LOCALIZATION AND AXILLARY SENTINEL LYMPH NODE BX Left 01/16/2019   Procedure: LEFT BREAST LUMPECTOMY WITH NEEDLE LOCALIZATION AND SENTINEL LYMPH NODE BIOPSY;  Surgeon: Griselda Miner, MD;  Location: [facility] OR[facility];  Service: General;  Laterality: Left;   CESAREAN SECTION     COLONOSCOPY WITH PROPOFOL N/A 03/27/2017   Procedure: COLONOSCOPY WITH PROPOFOL;  Surgeon: Wyline Mood, MD;  Location: [facility] ENDOSCOPY;  Service: Gastroenterology;  Laterality: N/A;   COLONOSCOPY WITH PROPOFOL N/A 04/04/2022   Procedure: COLONOSCOPY WITH PROPOFOL;  Surgeon: Wyline Mood, MD;  Location: [facility] ENDOSCOPY;  Service: Gastroenterology;  Laterality: N/A;   ESOPHAGOGASTRODUODENOSCOPY     ESOPHAGOGASTRODUODENOSCOPY (EGD) WITH PROPOFOL N/A 03/27/2017   Procedure: ESOPHAGOGASTRODUODENOSCOPY (EGD) WITH PROPOFOL WITH DILATION;  Surgeon: Wyline Mood, MD;  Location: [facility] ENDOSCOPY;  Service: Gastroenterology;  Laterality: N/A;   PORT-A-CATH REMOVAL     PORTACATH PLACEMENT N/A 02/15/2019   Procedure: INSERTION PORT-A-CATH WITH POSSIBLE ULTRASOUND;  Surgeon: Griselda Miner, MD;  Location: [facility] OR[facility];  Service: General;  Laterality: N/A;   POSTERIOR CERVICAL FUSION/FORAMINOTOMY N/A 07/29/2022   Procedure: C2-T2 POSTERIOR SPINAL FUSION;  Surgeon: Venetia Night, MD;  Location: [facility];  Service: Neurosurgery;  Laterality: N/A;   RE-EXCISION OF BREAST CANCER,SUPERIOR MARGINS Left 02/15/2019   Procedure: RE-EXCISION OF LEFT BREAST CANCER ANTERIOR MARGINS;  Surgeon: Griselda Miner, MD;  Location: [facility] OR[facility];  Service: General;  Laterality: Left;   THROAT SURGERY  2015   UMBILICAL HERNIA REPAIR  2006    x 2   w mesh per pt    Social History   Socioeconomic History   Marital status: Single    Spouse name: Not on file   Number of children: Not on file   Years of education: Not on file   Highest education level: 12th grade  Occupational History   Not on file  Tobacco Use   Smoking status: Former    Current packs/day: 0.00    Average packs/day: 2.0 packs/day for 20.0 years (40.0 ttl pk-yrs)    Types: Cigarettes    Start date: 06/02/1986    Quit date: 06/02/2006    Years since quitting: 16.5  Smokeless tobacco: Never  Vaping Use   Vaping status: Never Used  Substance and Sexual Activity   Alcohol use: Yes    Comment: rarely   Drug use: No   Sexual activity: Yes    Partners: Male  Other Topics Concern   Not on file  Social History Narrative   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 As of 02/26/17    Divorced.    Former smoker 20+ years quit in 1997 then started again for 5 years and quit 10-12 years ago as of 03/08/17    Social Determinants of Health   Financial Resource Strain: High Risk (06/09/2022)   Overall Financial Resource Strain (CARDIA)    Difficulty of Paying Living Expenses: Very hard  Food Insecurity: Food Insecurity Present (07/29/2022)   Hunger Vital Sign    Worried About Running Out of Food in the Last Year: Often true    Ran Out of  Food in the Last Year: Never true  Transportation Needs: No Transportation Needs (07/29/2022)   PRAPARE - Administrator, Civil Service (Medical): No    Lack of Transportation (Non-Medical): No  Physical Activity: Insufficiently Active (06/09/2022)   Exercise Vital Sign    Days of Exercise per Week: 3 days    Minutes of Exercise per Session: 10 min  Stress: Stress Concern Present (06/09/2022)   [employer] of Occupational Health - Occupational Stress Questionnaire    Feeling of Stress : Very much  Social Connections: Socially Isolated (06/09/2022)   Social Connection and Isolation Panel [NHANES]    Frequency of Communication with Friends and Family: Once a week    Frequency of Social Gatherings with Friends and Family: Never    Attends Religious Services: 1 to 4 times per year    Active Member of [employer] or Organizations: No    Attends Engineer, structural: Not on file    Marital Status: Divorced  Intimate Partner Violence: Not At Risk (07/29/2022)   Humiliation, Afraid, Rape, and Kick questionnaire    Fear of Current or Ex-Partner: No    Emotionally Abused: No    Physically Abused: No    Sexually Abused: No    Family History  Problem Relation Age of Onset   Breast cancer Mother        dx late 31s   Thyroid disease Mother    Colon polyps Mother    Prostate cancer Father        dx 29   Breast cancer Maternal Grandmother        dx late 20s   Osteoporosis Maternal Grandmother    Cancer Maternal Uncle        possibly, unsure type     Current Outpatient Medications:    acetaminophen (TYLENOL) 500 MG tablet, Take 500-1,000 mg by mouth every 6 (six) hours as needed (for pain.)., Disp: , Rfl:    albuterol (PROVENTIL) (2.5 MG/3ML) 0.083% nebulizer solution, Take 3 mLs (2.5 mg total) by nebulization every 6 (six) hours as needed for wheezing or shortness of breath., Disp: 150 mL, Rfl: 1   albuterol (VENTOLIN HFA) 108 (90 Base) MCG/ACT inhaler, Inhale 2 puffs  into the lungs every 4 (four) hours as needed for wheezing or shortness of breath., Disp: 54 g, Rfl: 11   anastrozole (ARIMIDEX) 1 MG tablet, Take 1 tablet (1 mg total) by mouth  daily., Disp: 90 tablet, Rfl: 2   atorvastatin (LIPITOR) 40 MG tablet, Take 1 tablet (40 mg total) by mouth daily., Disp: 90 tablet, Rfl: 3   budesonide-formoterol (SYMBICORT) 160-4.5 MCG/ACT inhaler, Inhale 2 puffs into the lungs in the morning and at bedtime., Disp: 30.6 g, Rfl: 12   celecoxib (CELEBREX) 200 MG capsule, Take 1 capsule by mouth 2 (two) times daily., Disp: , Rfl:    cetirizine (ZYRTEC) 10 MG tablet, Take 1 tablet (10 mg total) by mouth daily as needed for allergies., Disp: 90 tablet, Rfl: 3   Cholecalciferol (VITAMIN D-3) 125 MCG (5000 UT) TABS, Take 5,000 Units by mouth daily., Disp: , Rfl:    Cyanocobalamin (VITAMIN B-12 PO), Take 1 tablet by mouth daily. 1000mg  daily, Disp: , Rfl:    DULoxetine (CYMBALTA) 60 MG capsule, TAKE 1 CAPSULE BY MOUTH EVERY DAY, Disp: 90 capsule, Rfl: 1   gabapentin (NEURONTIN) 300 MG capsule, TAKE 1 CAPSULE BY MOUTH THREE TIMES A DAY, Disp: 90 capsule, Rfl: 2   meclizine (ANTIVERT) 12.5 MG tablet, TAKE 1 TABLET BY MOUTH 3 TIMES DAILY AS NEEDED FOR DIZZINESS., Disp: 90 tablet, Rfl: 3   omeprazole (PRILOSEC) 40 MG capsule, Take 1 capsule (40 mg total) by mouth every evening. 30 min before food, Disp: 90 capsule, Rfl: 3   senna (SENOKOT) 8.6 MG TABS tablet, Take 1 tablet (8.6 mg total) by mouth 2 (two) times daily., Disp: 120 tablet, Rfl: 0   tiZANidine (ZANAFLEX) 4 MG tablet, TAKE 1 TABLET (4 MG TOTAL) BY MOUTH EVERY 8 (EIGHT) HOURS AS NEEDED FOR MUSCLE SPASMS, Disp: 60 tablet, Rfl: 0   traZODone (DESYREL) 50 MG tablet, Take 1-1.5 tablets (50-75 mg total) by mouth at bedtime as needed for sleep., Disp: 140 tablet, Rfl: 11   valACYclovir (VALTREX) 1000 MG tablet, Take 1 tablet (1,000 mg total) by mouth daily. Suppression. BID X 5 days with outbreak as needed (Patient taking  differently: Take 1,000 mg by mouth as needed. Suppression. BID X 5 days with outbreak as needed), Disp: 120 tablet, Rfl: 3 No current facility-administered medications for this visit.  Facility-Administered Medications Ordered in Other Visits:    Zoledronic Acid (ZOMETA) IVPB 4 mg, 4 mg, Intravenous, Q6 months, Creig Hines, MD, Stopped at 11/28/22 1423  Physical exam:  Vitals:   11/28/22 1316  BP: 130/81  Pulse: 89  Resp: 16  Temp: 97.8 F (36.6 C)  TempSrc: Tympanic  SpO2: 98%  Weight: 186 lb 9.6 oz (84.6 kg)  Height: 5\' 3"  (1.6 m)   Physical Exam Cardiovascular:     Rate and Rhythm: Normal rate and regular rhythm.     Heart sounds: Normal heart sounds.  Pulmonary:     Effort: Pulmonary effort is normal.     Breath sounds: Normal breath sounds.  Abdominal:     General: Bowel sounds are normal.     Palpations: Abdomen is soft.  Skin:    General: Skin is warm and dry.  Neurological:     Mental Status: She is alert and oriented to person, place, and time.         Latest Ref Rng & Units 11/28/2022   12:55 PM  CMP  Glucose 70 - 99 mg/dL 87   BUN 6 - 20 mg/dL 18   Creatinine 1.61 - 1.00 mg/dL 0.96   Sodium 045 - 409 mmol/L 137   Potassium 3.5 - 5.1 mmol/L 4.0   Chloride 98 - 111 mmol/L 103   CO2 22 -  32 mmol/L 29   Calcium 8.9 - 10.3 mg/dL 9.5   Total Protein 6.5 - 8.1 g/dL 7.0   Total Bilirubin 0.3 - 1.2 mg/dL 0.6   Alkaline Phos 38 - 126 U/L 65   AST 15 - 41 U/L 19   ALT 0 - 44 U/L 22       Latest Ref Rng & Units 11/28/2022   12:55 PM  CBC  WBC 4.0 - 10.5 K/uL 10.0   Hemoglobin 12.0 - 15.0 g/dL 71.2   Hematocrit 45.8 - 46.0 % 40.7   Platelets 150 - 400 K/uL 373     No images are attached to the encounter.  MM 3D DIAGNOSTIC MAMMOGRAM BILATERAL BREAST  Result Date: 11/28/2022 CLINICAL DATA:  Status post LEFT lumpectomy in November 2020 with radiation for malignancy. On anastrozole. Strong family history. EXAM: DIGITAL DIAGNOSTIC BILATERAL MAMMOGRAM  WITH TOMOSYNTHESIS AND CAD TECHNIQUE: Bilateral digital diagnostic mammography and breast tomosynthesis was performed. The images were evaluated with computer-aided detection. COMPARISON:  Previous exam(s). ACR Breast Density Category c: The breasts are heterogeneously dense, which may obscure small masses. FINDINGS: There is density and architectural distortion within the LEFT breast, consistent with postsurgical changes. These are stable in comparison to prior. No suspicious mass, distortion, or microcalcifications are identified to suggest presence of malignancy. IMPRESSION: No mammographic evidence of malignancy bilaterally. RECOMMENDATION: Recommend continued annual high risk screening protocol with annual mammography and breast MRI with and without contrast given history of malignancy at a young age with dense breast tissue. Patient is due for annual MRI in March 2025. Per protocol, as the patient is now 2 or more years status post lumpectomy, she may return to annual screening mammography in 1 year. However, given the history of breast cancer, the patient remains eligible for annual diagnostic mammography if preferred. I have discussed the findings and recommendations with the patient. If applicable, a reminder letter will be sent to the patient regarding the next appointment. BI-RADS CATEGORY  2: Benign. Electronically Signed   By: Meda Klinefelter M.D.   On: 11/28/2022 10:03   DG Bone Density  Result Date: 11/15/2022 EXAM: DUAL X-RAY ABSORPTIOMETRY (DXA) FOR BONE MINERAL DENSITY IMPRESSION: Your patient Alethia Goshorn completed a BMD test on 11/15/2022 using the [employer] Advance DXA System (analysis version: 14.10) manufactured by [employer]. The following summarizes the results of our evaluation. Technologist: SCE PATIENT BIOGRAPHICAL: Name: Nervie, Langton Patient ID:  [id number] Birth Date: 03-Feb-1966 Height:     63.0 in. Gender:      Female Exam Date:  11/15/2022 Weight:     185.8 lbs.  Indications: Asthma, Caucasian, History of Chemotherapy, History of Radiation, Neuropathy, Osteoarthritis, Osteopenia, POSTmenopausal, Rheumatoid Arthritis Fractures: Treatments: Anastrozole, Calcium, Gabapentin, Omeprazole, Vitamin D, Zometa, Zyrtec ASSESSMENT: The BMD measured at Femur Neck Left is 0.863 g/cm2 with a T-score of -1.3. This patient is considered osteopenic according to World Health Organization [facility]) criteria. The scan quality is good. Lumbar spine was not utilized due to advanced degenerative changes. The patient is not a candidate for FRAX due to patient being on Zometa. Site Region Measured Measured WHO Young Adult BMD Date       Age      Classification T-score DualFemur Neck Left 11/15/2024 54.7 Osteopenia -1.3 0.863 g/cm2 Left Forearm Radius 33% 11/15/2022 54.7 Normal 0.4 0.910 g/cm2 World Health Organization [facility]) criteria for post-menopausal, Caucasian Women: Normal:       T-score at or above -1 SD Osteopenia:   T-score between -1  and -2.5 SD Osteoporosis: T-score at or below -2.5 SD RECOMMENDATIONS: 1. All patients should optimize calcium and vitamin D intake. 2. Consider FDA-approved medical therapies in postmenopausal women and men aged 74 years and older, based on the following: a. A hip or vertebral (clinical or morphometric) fracture b. T-score < -2.5 at the femoral neck or spine after appropriate evaluation to exclude secondary causes c. Low bone mass (T-score between -1.0 and -2.5 at the femoral neck or spine) and a 10-year probability of a hip fracture > 3% or a 10-year probability of a major osteoporosis-related fracture > 20% based on the [place]-adapted WHO algorithm d. Clinician judgment and/or patient preferences may indicate treatment for people with 10-year fracture probabilities above or below these levels \\FOLLOW -UP: People with diagnosed cases of osteoporosis or at high risk for fracture should have regular bone mineral density tests. For patients eligible for Medicare, routine  testing is allowed once every 2 years. The testing frequency can be increased to one year for patients who have rapidly progressing disease, those who are receiving or discontinuing medical therapy to restore bone mass, or have additional risk factors. I have reviewed this report, and agree with the above findings. [facility]iology Electronically Signed   By: Frederico Hamman M.D.   On: 11/15/2022 12:45     Assessment and plan- Patient is a 57 y.o. female with history of left breast cancer ER/PR positive HER2 negative stage I s/p lumpectomy adjuvant chemotherapy and radiation.  She is currently on Arimidex and this is a routine follow-up visit  Patient has been on adjuvant Zometa for high risk breast cancer and this will be her last dose today marking 3 years of completion of adjuvant bisphosphonates.  Patient will continue with Arimidex along with calcium and vitamin D which she will take for 10 years ending in August 2031.Her recent bone density scan showed osteopenia with a T-score of -1.3 in the left femur neck which is overall stable as compared to prior.  Fatigue: I am checking ferritin and iron studies B12 folate and TSH today.   Visit Diagnosis 1. Encounter for follow-up surveillance of breast cancer   2. Other fatigue   3. Encounter for monitoring zoledronic acid therapy   4. Visit for monitoring Arimidex therapy      Dr. Owens Shark, MD, MPH [facility] [phone] 11/28/2022 2:49 PM

## 2022-11-29 ENCOUNTER — Other Ambulatory Visit: Payer: Self-pay | Admitting: *Deleted

## 2022-11-29 ENCOUNTER — Other Ambulatory Visit: Payer: Self-pay | Admitting: Medical Oncology

## 2022-11-29 ENCOUNTER — Telehealth: Payer: Self-pay | Admitting: *Deleted

## 2022-11-29 MED ORDER — GABAPENTIN 600 MG PO TABS: 600.0000 mg | ORAL_TABLET | Freq: Three times a day (TID) | ORAL | 3 refills | Status: DC

## 2022-11-29 MED ORDER — GABAPENTIN 300 MG PO CAPS: 300.0000 mg | ORAL_CAPSULE | Freq: Every day | ORAL | 3 refills | Status: DC

## 2022-11-29 NOTE — Telephone Encounter (Signed)
I called and spoke to pt. And let her know that I put in 600 mg tablets for 3 times a day-1 in am , 1 in afternoon, and 1 at night. Because Smith Robert wants her to have 900 at night I sent a 300 mg and she takes 600mg  at night will be 900 together for night. She is ok with that .

## 2022-11-30 ENCOUNTER — Ambulatory Visit (INDEPENDENT_AMBULATORY_CARE_PROVIDER_SITE_OTHER): Payer: Medicaid Other | Admitting: Family Medicine

## 2022-11-30 ENCOUNTER — Encounter: Payer: Self-pay | Admitting: Family Medicine

## 2022-11-30 ENCOUNTER — Encounter: Payer: Self-pay | Admitting: Student in an Organized Health Care Education/Training Program

## 2022-11-30 VITALS — BP 134/84 | HR 90 | Temp 98.2°F | Resp 16 | Ht 63.0 in | Wt 186.1 lb

## 2022-11-30 DIAGNOSIS — E611 Iron deficiency: Secondary | ICD-10-CM | POA: Diagnosis not present

## 2022-11-30 DIAGNOSIS — E6609 Other obesity due to excess calories: Secondary | ICD-10-CM

## 2022-11-30 DIAGNOSIS — F39 Unspecified mood [affective] disorder: Secondary | ICD-10-CM

## 2022-11-30 DIAGNOSIS — R5383 Other fatigue: Secondary | ICD-10-CM

## 2022-11-30 MED ORDER — BUPROPION HCL ER (XL) 150 MG PO TB24: 150.0000 mg | ORAL_TABLET | Freq: Every day | ORAL | 0 refills | Status: DC

## 2022-11-30 MED ORDER — IRON (FERROUS SULFATE) 325 (65 FE) MG PO TABS
325.0000 mg | ORAL_TABLET | ORAL | 3 refills | Status: DC
Start: 2022-11-30 — End: 2022-12-06

## 2022-11-30 NOTE — Patient Instructions (Addendum)
It was a pleasure meeting you today. Thank you for allowing me to take part in your health care.  Our goals for today as we discussed include:  Start Wellbutrin XL 150 mg in the morning Send MyChart message next week to update if any side effects  Ferritin is low Start Iron supplements daily  Call Dr Assunta Gambles office to confirm dose of Gabapentin  Recommend talk to Pulmonology and request sleep studies  Follow up in 2 weeks  If you have any questions or concerns, please do not hesitate to call the office at [phone].  I look forward to our next visit and until then take care and stay safe.  Regards,   Dana Allan, MD   [facility]

## 2022-11-30 NOTE — Progress Notes (Signed)
SUBJECTIVE:   Chief Complaint  Patient presents with   Anxiety   Depression   Fatigue   HPI Presents to clinic for acute visit  Mood disorder Has been under increased stress recently.  Recent coworkers with family diagnosis of cancer, now having increased financial stress.  Reports increased fatigue, weight gain and depressed mood.  Requesting medication to help with weight loss and increase mood.  Appetite, sleep poor.  Has little interest in things.  Denies any SI/HI.  Has not seen therapist in some time.  PERTINENT PMH / PSH: Breast Ca Cervical myelopathy  OBJECTIVE:  BP 134/84   Pulse 90   Temp 98.2 F (36.8 C)   Resp 16   Ht 5\' 3"  (1.6 m)   Wt 186 lb 2 oz (84.4 kg)   SpO2 99%   BMI 32.97 kg/m    Physical Exam Vitals reviewed.  Constitutional:      General: She is not in acute distress.    Appearance: Normal appearance. She is normal weight. She is not ill-appearing, toxic-appearing or diaphoretic.  Eyes:     General:        Right eye: No discharge.        Left eye: No discharge.     Conjunctiva/sclera: Conjunctivae normal.  Cardiovascular:     Rate and Rhythm: Normal rate.  Pulmonary:     Effort: Pulmonary effort is normal.  Skin:    General: Skin is warm and dry.  Neurological:     Mental Status: She is alert and oriented to person, place, and time. Mental status is at baseline.  Psychiatric:        Attention and Perception: Attention normal.        Mood and Affect: Mood is depressed.        Behavior: Behavior normal.        Thought Content: Thought content normal. Thought content does not include homicidal or suicidal ideation. Thought content does not include homicidal or suicidal plan.        Judgment: Judgment normal.        11/30/2022   10:59 AM 10/19/2022    1:04 PM 07/20/2022   10:50 AM 06/13/2022    8:45 AM 04/05/2022    2:44 PM  Depression screen PHQ 2/9  Decreased Interest 2 1 2 2  0  Down, Depressed, Hopeless 3 2 2 2  0  PHQ - 2 Score 5 3  4 4  0  Altered sleeping 3 3 3 3    Tired, decreased energy 3 1 3 1    Change in appetite 2 2 3 2    Feeling bad or failure about yourself  3 1 3 2    Trouble concentrating 3 2 2 3    Moving slowly or fidgety/restless 1 0 2 1   Suicidal thoughts 0 0 0 0   PHQ-9 Score 20 12 20 16    Difficult doing work/chores Somewhat difficult Somewhat difficult Somewhat difficult Somewhat difficult       11/30/2022   10:59 AM 10/19/2022    1:04 PM 07/20/2022   10:51 AM 06/13/2022    8:46 AM  GAD 7 : Generalized Anxiety Score  Nervous, Anxious, on Edge 3 3 3 2   Control/stop worrying 3 3 3 2   Worry too much - different things 3 3 3 2   Trouble relaxing 3 3 3 1   Restless 2 2 2 1   Easily annoyed or irritable 2 0 1 1  Afraid - awful might happen 3 3 3  2  Total GAD 7 Score 19 17 18 11   Anxiety Difficulty Very difficult Somewhat difficult Somewhat difficult Somewhat difficult    ASSESSMENT/PLAN:  Mood disorder (HCC) Assessment & Plan: Chronic.  PHQ9/GAD elevated  MDQ screening probable Bipolar.  Denies any SI/HI. Continue Duloxetine 60 mg daily Start Wellbutrin XL 150 mg daily Recommend restart CBT with therapist Recommend psychiatry evaluation, declined for now Follow-up in 2 weeks  Orders: -     buPROPion HCl ER (XL); Take 1 tablet (150 mg total) by mouth daily.  Dispense: 30 tablet; Refill: 0  Iron deficiency Assessment & Plan: Recent Ferritin 10, likely contributing to fatigue Start Ferrous sulfate 325 mg daily Consider sleep studies, Epworth score 11.  Declined referral today, will consider in future if no improvement with medication. Follow up as needed  Orders: -     Iron (Ferrous Sulfate); Take 325 mg by mouth daily.  Dispense: 90 tablet; Refill: 3   PDMP reviewed  Return in about 2 weeks (around 12/14/2022) for PCP.  Dana Allan, MD

## 2022-12-06 ENCOUNTER — Encounter: Payer: Self-pay | Admitting: Family Medicine

## 2022-12-06 DIAGNOSIS — S86911A Strain of unspecified muscle(s) and tendon(s) at lower leg level, right leg, initial encounter: Secondary | ICD-10-CM | POA: Diagnosis not present

## 2022-12-06 DIAGNOSIS — E611 Iron deficiency: Secondary | ICD-10-CM | POA: Insufficient documentation

## 2022-12-06 MED ORDER — IRON (FERROUS SULFATE) 325 (65 FE) MG PO TABS
325.0000 mg | ORAL_TABLET | Freq: Every day | ORAL | 3 refills | Status: DC
Start: 2022-12-06 — End: 2023-08-23

## 2022-12-06 NOTE — Assessment & Plan Note (Addendum)
Chronic.  PHQ9/GAD elevated  MDQ screening probable Bipolar.  Denies any SI/HI. Continue Duloxetine 60 mg daily Start Wellbutrin XL 150 mg daily Recommend restart CBT with therapist Recommend psychiatry evaluation, declined for now Follow-up in 2 weeks

## 2022-12-06 NOTE — Assessment & Plan Note (Addendum)
Recent Ferritin 10, likely contributing to fatigue Start Ferrous sulfate 325 mg daily Consider sleep studies, Epworth score 11.  Declined referral today, will consider in future if no improvement with medication. Follow up as needed

## 2022-12-08 ENCOUNTER — Other Ambulatory Visit: Payer: Self-pay | Admitting: Family Medicine

## 2022-12-08 DIAGNOSIS — F39 Unspecified mood [affective] disorder: Secondary | ICD-10-CM

## 2022-12-09 ENCOUNTER — Telehealth: Payer: Self-pay | Admitting: Student in an Organized Health Care Education/Training Program

## 2022-12-09 NOTE — Telephone Encounter (Signed)
HST was ordered by Dr. Aundria Rud on 12/01/22 based off a phone note from patient. Patient has [employer] they don't required PA for the code 16109 but for the code [phone] they do require PA which they have denied. If the patient does the HST billing just the code 09811 she may have to pay for the sleep study. There hasn't been an official sleep consult, no epworth score, no symptoms mention in Dr. Doreene Adas notes other then the phone note

## 2022-12-09 NOTE — Telephone Encounter (Signed)
Spoke to patient and scheduled appt 12/26/2022 at 3:15. Nothing further needed.

## 2022-12-16 ENCOUNTER — Ambulatory Visit (INDEPENDENT_AMBULATORY_CARE_PROVIDER_SITE_OTHER): Payer: Medicaid Other | Admitting: Family Medicine

## 2022-12-16 ENCOUNTER — Encounter: Payer: Self-pay | Admitting: Family Medicine

## 2022-12-16 VITALS — BP 118/70 | HR 88 | Temp 97.7°F | Resp 16 | Ht 63.0 in | Wt 184.1 lb

## 2022-12-16 DIAGNOSIS — F39 Unspecified mood [affective] disorder: Secondary | ICD-10-CM

## 2022-12-16 MED ORDER — BUPROPION HCL ER (XL) 300 MG PO TB24
300.0000 mg | ORAL_TABLET | Freq: Every day | ORAL | 0 refills | Status: DC
Start: 2022-12-16 — End: 2022-12-30

## 2022-12-16 NOTE — Progress Notes (Signed)
SUBJECTIVE:   Chief Complaint  Patient presents with   Anxiety   HPI Presents to clinic for follow up mood disorder   Discussed the use of AI scribe software for clinical note transcription with the patient, who gave verbal consent to proceed.  History of Present Illness The patient, who was started on Wellbutrin two weeks ago, reports a slight improvement in her mood, particularly at work. She notes that she does not experience fatigue until around three or four in the afternoon, at which point she begins to yawn continuously until she leaves work. Despite this, she expresses a desire to increase her dosage from the initial small dose.  The patient also discusses her ability to cope with external stressors, such as the health of her assistant's father and her own financial situation. She mentions that her daughter has been helping with finances, but communication has been strained. Her son, who is currently unemployed, has not been able to contribute.  Despite these stressors, the patient denies any thoughts of self-harm or harm to others. She reports that her mood has improved, and she is less snippy and agitated. She denies any decrease in appetite, a potential side effect of Wellbutrin, and affirms that she is eating regularly.  The patient has not followed up with her therapist due to time constraints and work demands but plans to do so when she can. She expresses a preference for early morning appointments and is open to virtual consultations. She also expresses a willingness to receive a flu shot at her next visit.    PERTINENT PMH / PSH: As above  OBJECTIVE:  BP 118/70   Pulse 88   Temp 97.7 F (36.5 C)   Resp 16   Ht 5\' 3"  (1.6 m)   Wt 184 lb 2 oz (83.5 kg)   SpO2 97%   BMI 32.62 kg/m    Physical Exam Vitals reviewed.  Constitutional:      General: She is not in acute distress.    Appearance: Normal appearance. She is normal weight. She is not ill-appearing,  toxic-appearing or diaphoretic.  Eyes:     General:        Right eye: No discharge.        Left eye: No discharge.     Conjunctiva/sclera: Conjunctivae normal.  Cardiovascular:     Rate and Rhythm: Normal rate.  Pulmonary:     Effort: Pulmonary effort is normal.  Skin:    General: Skin is warm and dry.  Neurological:     Mental Status: She is alert and oriented to person, place, and time. Mental status is at baseline.  Psychiatric:        Attention and Perception: Attention normal.        Mood and Affect: Mood normal.        Speech: Speech normal.        Behavior: Behavior normal.        Thought Content: Thought content normal. Thought content does not include homicidal or suicidal ideation. Thought content does not include homicidal or suicidal plan.        Judgment: Judgment normal.        12/16/2022    8:54 AM 11/30/2022   10:59 AM 10/19/2022    1:04 PM 07/20/2022   10:50 AM 06/13/2022    8:45 AM  Depression screen PHQ 2/9  Decreased Interest 2 2 1 2 2   Down, Depressed, Hopeless 2 3 2 2 2   PHQ - 2 Score  4 5 3 4 4   Altered sleeping 3 3 3 3 3   Tired, decreased energy 3 3 1 3 1   Change in appetite 3 2 2 3 2   Feeling bad or failure about yourself  2 3 1 3 2   Trouble concentrating 2 3 2 2 3   Moving slowly or fidgety/restless 0 1 0 2 1  Suicidal thoughts 0 0 0 0 0  PHQ-9 Score 17 20 12 20 16   Difficult doing work/chores Somewhat difficult Somewhat difficult Somewhat difficult Somewhat difficult Somewhat difficult      12/16/2022    8:54 AM 11/30/2022   10:59 AM 10/19/2022    1:04 PM 07/20/2022   10:51 AM  GAD 7 : Generalized Anxiety Score  Nervous, Anxious, on Edge 2 3 3 3   Control/stop worrying 3 3 3 3   Worry too much - different things 3 3 3 3   Trouble relaxing 3 3 3 3   Restless 0 2 2 2   Easily annoyed or irritable 1 2 0 1  Afraid - awful might happen 2 3 3 3   Total GAD 7 Score 14 19 17 18   Anxiety Difficulty Somewhat difficult Very difficult Somewhat difficult Somewhat  difficult    ASSESSMENT/PLAN:  Mood disorder [facility]) Assessment & Plan: Not currently attending due to time constraints. Noted some improvement on Wellbutrin 150mg , but still experiencing fatigue and mood symptoms. Discussed potential side effects of increasing dose, including lowering seizure threshold and increased sweating. -Increase Wellbutrin to 300mg  daily. -Keep remaining 150mg  tablets to revert back to if side effects of 300mg  are intolerable. -Encouraged to follow up with therapist when possible. -Follow up in two weeks to assess response to increased Wellbutrin dose.    Orders: -     buPROPion HCl ER (XL); Take 1 tablet (300 mg total) by mouth daily.  Dispense: 30 tablet; Refill: 0     General Health Maintenance -Plan to administer flu shot at next visit in two weeks.  PDMP reviewed  Return in about 2 weeks (around 12/30/2022) for PCP.  Dana Allan, MD

## 2022-12-16 NOTE — Patient Instructions (Signed)
It was a pleasure meeting you today. Thank you for allowing me to take part in your health care.  Our goals for today as we discussed include:  Increase Wellbutrin to 300 mg daily If any side effects can go back to 150 mg daily  Follow up in 2 weeks  Flu vaccine at next visit  This is a list of the screening recommended for you and due dates:  Health Maintenance  Topic Date Due   COVID-19 Vaccine (1) Never done   Flu Shot  06/12/2023*   Mammogram  11/27/2024   Pap with HPV screening  08/21/2026   Colon Cancer Screening  04/05/2027   DEXA scan (bone density measurement)  11/15/2027   DTaP/Tdap/Td vaccine (2 - Td or Tdap) 03/13/2028   Hepatitis C Screening  Completed   HIV Screening  Completed   Zoster (Shingles) Vaccine  Completed   HPV Vaccine  Aged Out  *Topic was postponed. The date shown is not the original due date.     If you have any questions or concerns, please do not hesitate to call the office at [phone].  I look forward to our next visit and until then take care and stay safe.  Regards,   Dana Allan, MD   [facility]

## 2022-12-22 ENCOUNTER — Ambulatory Visit
Admission: RE | Admit: 2022-12-22 | Discharge: 2022-12-22 | Disposition: A | Discharge status: Home / Self Care | Payer: Medicaid Other | Source: Ambulatory Visit | Attending: Radiation Oncology | Admitting: Radiation Oncology

## 2022-12-22 ENCOUNTER — Encounter: Payer: Self-pay | Admitting: Radiation Oncology

## 2022-12-22 VITALS — BP 104/74 | HR 82 | Temp 96.7°F | Wt 182.3 lb

## 2022-12-22 DIAGNOSIS — Z170 Estrogen receptor positive status [ER+]: Secondary | ICD-10-CM | POA: Insufficient documentation

## 2022-12-22 DIAGNOSIS — Z79811 Long term (current) use of aromatase inhibitors: Secondary | ICD-10-CM | POA: Insufficient documentation

## 2022-12-22 DIAGNOSIS — Z923 Personal history of irradiation: Secondary | ICD-10-CM | POA: Diagnosis not present

## 2022-12-22 DIAGNOSIS — C50212 Malignant neoplasm of upper-inner quadrant of left female breast: Secondary | ICD-10-CM | POA: Insufficient documentation

## 2022-12-22 NOTE — Progress Notes (Signed)
Radiation Oncology Follow up Note  Name: Leah [name]   Date:   12/22/2022 MRN:  [id number] DOB: 29-May-1965    This 57 y.o. 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  REFERRING PROVIDER: McLean-Scocuzza, French Ana *  HPI: Patient is a 57 year old female now out over 3 years having completed whole breast radiation to her left breast for stage Ia ER/PR positive invasive mammary carcinoma.  Seen today in routine follow-up she is doing well.  Specifically denies breast tenderness cough or bone pain..  She had mammograms last month which I have reviewed were BI-RADS 2 benign.  She is currently on Arimidex tolerating it well without side effect.  COMPLICATIONS OF TREATMENT: none  FOLLOW UP COMPLIANCE: keeps appointments   PHYSICAL EXAM:  BP 104/74   Pulse 82   Temp (!) 96.7 F (35.9 C) (Tympanic)   Wt 182 lb 4.8 oz (82.7 kg)   BMI 32.29 kg/m  Lungs are clear to A&P cardiac examination essentially unremarkable with regular rate and rhythm. No dominant mass or nodularity is noted in either breast in 2 positions examined. Incision is well-healed. No axillary or supraclavicular adenopathy is appreciated. Cosmetic result is excellent.  Well-developed well-nourished patient in NAD. HEENT reveals PERLA, EOMI, discs not visualized.  Oral cavity is clear. No oral mucosal lesions are identified. Neck is clear without evidence of cervical or supraclavicular adenopathy. Lungs are clear to A&P. Cardiac examination is essentially unremarkable with regular rate and rhythm without murmur rub or thrill. Abdomen is benign with no organomegaly or masses noted. Motor sensory and DTR levels are equal and symmetric in the upper and lower extremities. Cranial nerves II through XII are grossly intact. Proprioception is intact. No peripheral adenopathy or edema is identified. No  motor or sensory levels are noted. Crude visual fields are within normal range.  RADIOLOGY RESULTS: Mammograms reviewed compatible with above-stated findings.  PLAN: The present time patient is doing well no evidence of disease now at over 3 years.  She continues on Arimidex without side effect.  I will turn follow-up care over to medical oncology.  I be happy to reevaluate the patient anytime should that be indicated.  I would like to take this opportunity to thank you for allowing me to participate in the care of your patient.Carmina Miller, MD

## 2022-12-24 ENCOUNTER — Encounter: Payer: Self-pay | Admitting: Family Medicine

## 2022-12-24 NOTE — Assessment & Plan Note (Addendum)
Not currently attending due to time constraints. Noted some improvement on Wellbutrin 150mg , but still experiencing fatigue and mood symptoms. Discussed potential side effects of increasing dose, including lowering seizure threshold and increased sweating. -Increase Wellbutrin to 300mg  daily. -Keep remaining 150mg  tablets to revert back to if side effects of 300mg  are intolerable. -Encouraged to follow up with therapist when possible. -Follow up in two weeks to assess response to increased Wellbutrin dose.

## 2022-12-26 ENCOUNTER — Ambulatory Visit (INDEPENDENT_AMBULATORY_CARE_PROVIDER_SITE_OTHER): Payer: Medicaid Other | Admitting: Student in an Organized Health Care Education/Training Program

## 2022-12-26 ENCOUNTER — Encounter: Payer: Self-pay | Admitting: Student in an Organized Health Care Education/Training Program

## 2022-12-26 VITALS — BP 132/82 | HR 84 | Temp 97.6°F | Ht 63.0 in | Wt 184.2 lb

## 2022-12-26 DIAGNOSIS — Z6831 Body mass index (BMI) 31.0-31.9, adult: Secondary | ICD-10-CM | POA: Diagnosis not present

## 2022-12-26 DIAGNOSIS — E66811 Obesity, class 1: Secondary | ICD-10-CM | POA: Diagnosis not present

## 2022-12-26 DIAGNOSIS — E6609 Other obesity due to excess calories: Secondary | ICD-10-CM | POA: Diagnosis not present

## 2022-12-26 DIAGNOSIS — J4540 Moderate persistent asthma, uncomplicated: Secondary | ICD-10-CM | POA: Diagnosis not present

## 2022-12-26 DIAGNOSIS — G4733 Obstructive sleep apnea (adult) (pediatric): Secondary | ICD-10-CM | POA: Diagnosis not present

## 2022-12-26 NOTE — Progress Notes (Signed)
Assessment & Plan:   #OSA (obstructive sleep apnea) #Class 1 obesity due to excess calories with body mass index (BMI) of 31.0 to 31.9 in adult, unspecified whether serious comorbidity present   Presenting for the evaluation of fatigue and daytime sleepiness, with high risk for sleep apnea. Scores 8 on Epworth Sleepiness scale. She does score high on the enhanced STOP BANG (snoring, fatigue, sleepiness, high BMI, age > 50, neck size large) and physical exam is noted for a crowded oropharynx.   She is overall high risk for OSA which will ideally be worked up with an in lab split night sleep test to assess the severity of her sleep apnea, assess for concommitant central apnea, and titrate NIPPV to best overcome it.   - Split night study; Future  #Mild Intermittent Asthma  The patient has a history of childhood asthma that is active, requiring treatment with ICS/LABA controller therapy (Symbicort). She was started on Symbicort during a previous visit with significant response and improvement. She hasn't had any symptoms since. Allergen testing suggests Th2 asthma, and she is continuously exposed to dog dander. Will continue with Symbicort for now, and consider de-escalation during a future visit should symptoms remain stable.  Pulmonary function testing is consistent with asthma, and does not suggest a COPD overlap. I have counseled her on the importance of having her dogs sleep outside her bedroom and limit exposure, but she is not sure she will be able to follow through.  -Continue Symbicort two puffs bid   Return in about 6 months (around 06/26/2023).  I spent 30 minutes caring for this patient today, including preparing to see the patient, obtaining a medical history , reviewing a separately obtained history, performing a medically appropriate examination and/or evaluation, counseling and educating the patient/family/caregiver, ordering medications, tests, or procedures, documenting  clinical information in the electronic health record, and independently interpreting results (not separately reported/billed) and communicating results to the patient/family/caregiver  Raechel Chute, MD [facility] 12/26/2022 3:09 PM    End of visit medications:  No orders of the defined types were placed in this encounter.    Current Outpatient Medications:    acetaminophen (TYLENOL) 500 MG tablet, Take 500-1,000 mg by mouth every 6 (six) hours as needed (for pain.)., Disp: , Rfl:    albuterol (PROVENTIL) (2.5 MG/3ML) 0.083% nebulizer solution, Take 3 mLs (2.5 mg total) by nebulization every 6 (six) hours as needed for wheezing or shortness of breath., Disp: 150 mL, Rfl: 1   albuterol (VENTOLIN HFA) 108 (90 Base) MCG/ACT inhaler, Inhale 2 puffs into the lungs every 4 (four) hours as needed for wheezing or shortness of breath., Disp: 54 g, Rfl: 11   anastrozole (ARIMIDEX) 1 MG tablet, Take 1 tablet (1 mg total) by mouth daily., Disp: 90 tablet, Rfl: 2   atorvastatin (LIPITOR) 40 MG tablet, Take 1 tablet (40 mg total) by mouth daily., Disp: 90 tablet, Rfl: 3   budesonide-formoterol (SYMBICORT) 160-4.5 MCG/ACT inhaler, Inhale 2 puffs into the lungs in the morning and at bedtime., Disp: 30.6 g, Rfl: 12   buPROPion (WELLBUTRIN XL) 300 MG 24 hr tablet, Take 1 tablet (300 mg total) by mouth daily., Disp: 30 tablet, Rfl: 0   cetirizine (ZYRTEC) 10 MG tablet, Take 1 tablet (10 mg total) by mouth daily as needed for allergies., Disp: 90 tablet, Rfl: 3   Cholecalciferol (VITAMIN D-3) 125 MCG (5000 UT) TABS, Take 5,000 Units by mouth daily., Disp: , Rfl:    Cyanocobalamin (  VITAMIN B-12 PO), Take 1 tablet by mouth daily. 1000mg  daily, Disp: , Rfl:    DULoxetine (CYMBALTA) 60 MG capsule, TAKE 1 CAPSULE BY MOUTH EVERY DAY, Disp: 90 capsule, Rfl: 2   gabapentin (NEURONTIN) 300 MG capsule, Take 1 capsule (300 mg total) by mouth at bedtime., Disp: 30 capsule, Rfl: 3   gabapentin  (NEURONTIN) 600 MG tablet, Take 1 tablet (600 mg total) by mouth 3 (three) times daily., Disp: 90 tablet, Rfl: 3   Iron, Ferrous Sulfate, 325 (65 Fe) MG TABS, Take 325 mg by mouth daily., Disp: 90 tablet, Rfl: 3   omeprazole (PRILOSEC) 40 MG capsule, Take 1 capsule (40 mg total) by mouth every evening. 30 min before food, Disp: 90 capsule, Rfl: 3   senna (SENOKOT) 8.6 MG TABS tablet, Take 1 tablet (8.6 mg total) by mouth 2 (two) times daily., Disp: 120 tablet, Rfl: 0   tiZANidine (ZANAFLEX) 4 MG tablet, TAKE 1 TABLET (4 MG TOTAL) BY MOUTH EVERY 8 (EIGHT) HOURS AS NEEDED FOR MUSCLE SPASMS, Disp: 60 tablet, Rfl: 0   traZODone (DESYREL) 50 MG tablet, Take 1-1.5 tablets (50-75 mg total) by mouth at bedtime as needed for sleep., Disp: 140 tablet, Rfl: 11   valACYclovir (VALTREX) 1000 MG tablet, Take 1 tablet (1,000 mg total) by mouth daily. Suppression. BID X 5 days with outbreak as needed (Patient taking differently: Take 1,000 mg by mouth as needed. Suppression. BID X 5 days with outbreak as needed), Disp: 120 tablet, Rfl: 3   Subjective:   PATIENT ID: Leah [name] GENDER: female DOB: 08-09-1965, MRN: [id number]  Chief Complaint  Patient presents with   Follow-up    Snoring, and restless sleep.     HPI  The patient is a pleasant 57 year old female with a past medical history of childhood asthma presenting for follow up and evaluation of sleep apnea.  Since our last visit, her asthma has been under excellent control. She hasn't had to use her rescue inhaler but once, and is overall compliant with her Symbicort. She has dogs, and she is aware that her allergen profile is notable for significant allergies to cats and dogs. No wheeze, cough, chest tightness, or sputum production is reported.  She reports feeling overall fatigued during the day, with report of sleepiness at times. She does report snoring at night. She hasn't had a partner in a while and can't tell if she stops breathing or  gasps for air at night.  I initially saw her after a recent asthma exacerbation. Where she was seen in the ED. She was treated with nebulizers, steroids, and discharged with an Advair HFA inhaler.  She has had multiple episodes similar to this throughout the summer and has had 5 courses of prednisone over the summer to treat asthma exacerbations. No fevers or chills reported, no night sweats, no weight loss, no rash, and no chest pain. Patient has perennial allergies.    Her past medical history is notable for breast cancer status postresection, chemotherapy, and radiation therapy.  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  She reports multiple allergies.  She had asthma growing up. She has a son who has multiple allergies.   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  She is a non-smoker but did smoke in the past, quit in 2008.  She smoked for 20 years with around 30 to 40 pack years of smoking history on her. She denies any illicit drug use and denies any vaping.  Ancillary  information including prior medications, full medical/surgical/family/social histories, and PFTs (when available) are listed below and have been reviewed.   Review of Systems  Constitutional:  Negative for chills and fever.  Respiratory:  Negative for cough, hemoptysis, sputum production, shortness of breath and wheezing.   Cardiovascular:  Negative for chest pain, leg swelling and PND.  Skin:  Negative for rash.     Objective:   Vitals:   12/26/22 1454  BP: 132/82  Pulse: 84  Temp: 97.6 F (36.4 C)  TempSrc: Temporal  SpO2: 96%  Weight: 184 lb 3.2 oz (83.6 kg)  Height: 5\' 3"  (1.6 m)   96% on RA BMI Readings from Last 3 Encounters:  12/26/22 32.63 kg/m  12/22/22 32.29 kg/m  12/16/22 32.62 kg/m   Wt Readings from Last 3 Encounters:  12/26/22 184 lb 3.2 oz (83.6 kg)  12/22/22 182 lb 4.8 oz (82.7 kg)  12/16/22 184 lb 2 oz (83.5 kg)    Physical Exam Constitutional:       General: She is not in acute distress.    Appearance: Normal appearance. She is obese. She is not ill-appearing.  HENT:     Head: Normocephalic.     Nose: Nose normal.     Mouth/Throat:     Mouth: Mucous membranes are moist.     Comments: Crowded oropharynx Cardiovascular:     Rate and Rhythm: Normal rate and regular rhythm.     Pulses: Normal pulses.     Heart sounds: Normal heart sounds.  Pulmonary:     Effort: Pulmonary effort is normal. No respiratory distress.     Breath sounds: Normal breath sounds. No wheezing or rales.  Abdominal:     Palpations: Abdomen is soft.  Musculoskeletal:        General: Normal range of motion.  Skin:    General: Skin is warm.  Neurological:     General: No focal deficit present.     Mental Status: She is alert and oriented to person, place, and time. Mental status is at baseline.       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 Raul Del    Family history of breast cancer    Family history of prostate cancer    Gastroesophageal reflux disease without esophagitis 04/09/2022   Herniated disc, cervical    s/p epidural injection   Herpes    History of kidney stones    Hyperlipidemia    Low back pain    Migraines    Personal history of chemotherapy    Personal history of radiation therapy    Pneumonia      Family History  Problem Relation Age of Onset   Breast cancer Mother        dx late 50s   Thyroid disease Mother    Colon polyps Mother    Prostate cancer Father        dx 67   Breast cancer Maternal Grandmother        dx late 32s  Osteoporosis Maternal Grandmother    Cancer Maternal  Uncle        possibly, unsure type     Past Surgical History:  Procedure Laterality Date   ANTERIOR CERVICAL DECOMP/DISCECTOMY FUSION N/A 07/29/2022   Procedure: C5-T1 ANTERIOR CERVICAL DISCECTOMY AND FUSION;  Surgeon: Venetia Night, MD;  Location: [facility] ORS;  Service: Neurosurgery;  Laterality: N/A;   APPLICATION OF INTRAOPERATIVE CT SCAN N/A 07/29/2022   Procedure: APPLICATION OF INTRAOPERATIVE CT SCAN;  Surgeon: Venetia Night, MD;  Location: [facility];  Service: Neurosurgery;  Laterality: N/A;   BREAST BIOPSY Left 12/25/2018   [place] BX, invasive mammary carcinoma    BREAST LUMPECTOMY     BREAST LUMPECTOMY WITH NEEDLE LOCALIZATION AND AXILLARY SENTINEL LYMPH NODE BX Left 01/16/2019   Procedure: LEFT BREAST LUMPECTOMY WITH NEEDLE LOCALIZATION AND SENTINEL LYMPH NODE BIOPSY;  Surgeon: Griselda Miner, MD;  Location: [facility] ORS;  Service: General;  Laterality: Left;   CESAREAN SECTION     COLONOSCOPY WITH PROPOFOL N/A 03/27/2017   Procedure: COLONOSCOPY WITH PROPOFOL;  Surgeon: Wyline Mood, MD;  Location: [facility] ENDOSCOPY;  Service: Gastroenterology;  Laterality: N/A;   COLONOSCOPY WITH PROPOFOL N/A 04/04/2022   Procedure: COLONOSCOPY WITH PROPOFOL;  Surgeon: Wyline Mood, MD;  Location: [facility] ENDOSCOPY;  Service: Gastroenterology;  Laterality: N/A;   ESOPHAGOGASTRODUODENOSCOPY     ESOPHAGOGASTRODUODENOSCOPY (EGD) WITH PROPOFOL N/A 03/27/2017   Procedure: ESOPHAGOGASTRODUODENOSCOPY (EGD) WITH PROPOFOL WITH DILATION;  Surgeon: Wyline Mood, MD;  Location: [facility] ENDOSCOPY;  Service: Gastroenterology;  Laterality: N/A;   PORT-A-CATH REMOVAL     PORTACATH PLACEMENT N/A 02/15/2019   Procedure: INSERTION PORT-A-CATH WITH POSSIBLE ULTRASOUND;  Surgeon: Griselda Miner, MD;  Location: [facility] ORS;  Service: General;  Laterality: N/A;   POSTERIOR CERVICAL FUSION/FORAMINOTOMY N/A 07/29/2022   Procedure: C2-T2 POSTERIOR SPINAL FUSION;  Surgeon: Venetia Night, MD;  Location: [facility] ORS;  Service: Neurosurgery;   Laterality: N/A;   RE-EXCISION OF BREAST CANCER,SUPERIOR MARGINS Left 02/15/2019   Procedure: RE-EXCISION OF LEFT BREAST CANCER ANTERIOR MARGINS;  Surgeon: Griselda Miner, MD;  Location: [facility] ORS;  Service: General;  Laterality: Left;   THROAT SURGERY  2015   UMBILICAL HERNIA REPAIR  2006    x 2   w mesh per pt    Social History   Socioeconomic History   Marital status: Single    Spouse name: Not on file   Number of children: Not on file   Years of education: Not on file   Highest education level: 12th grade  Occupational History   Not on file  Tobacco Use   Smoking status: Former    Current packs/day: 0.00    Average packs/day: 2.0 packs/day for 20.0 years (40.0 ttl pk-yrs)    Types: Cigarettes    Start date: 06/02/1986    Quit date: 06/02/2006    Years since quitting: 16.5   Smokeless tobacco: Never  Vaping Use   Vaping status: Never Used  Substance and Sexual Activity   Alcohol use: Yes    Comment: rarely   Drug use: No   Sexual activity: Yes    Partners: Male  Other Topics Concern   Not on file  Social History Narrative   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 As of 02/26/17    Divorced.    Former smoker 20+ years quit in 1997 then started again for 5 years and  quit 10-12 years ago as of 03/08/17    Social Determinants of Health   Financial Resource Strain: High Risk (06/09/2022)   Overall Financial Resource Strain (CARDIA)    Difficulty of Paying Living Expenses: Very hard  Food Insecurity: Food Insecurity Present (07/29/2022)   Hunger Vital Sign    Worried About Running Out of Food in the Last Year: Often true    Ran Out of Food in the Last Year: Never true  Transportation Needs: No Transportation Needs (07/29/2022)   PRAPARE - Administrator, Civil Service (Medical): No    Lack of Transportation (Non-Medical): No  Physical Activity: Insufficiently Active  (06/09/2022)   Exercise Vital Sign    Days of Exercise per Week: 3 days    Minutes of Exercise per Session: 10 min  Stress: Stress Concern Present (06/09/2022)   [employer] of Occupational Health - Occupational Stress Questionnaire    Feeling of Stress : Very much  Social Connections: Socially Isolated (06/09/2022)   Social Connection and Isolation Panel [NHANES]    Frequency of Communication with Friends and Family: Once a week    Frequency of Social Gatherings with Friends and Family: Never    Attends Religious Services: 1 to 4 times per year    Active Member of [employer] or Organizations: No    Attends Engineer, structural: Not on file    Marital Status: Divorced  Intimate Partner Violence: Not At Risk (07/29/2022)   Humiliation, Afraid, Rape, and Kick questionnaire    Fear of Current or Ex-Partner: No    Emotionally Abused: No    Physically Abused: No    Sexually Abused: No     Allergies  Allergen Reactions   Hydrocodone-Acetaminophen Hives    All over her body.   Lactose Intolerance (Gi) Other (See Comments)    Bloating and GI upset   Cephalexin Itching   Penicillins Rash    Childhood reaction    Sulfa Antibiotics Rash    Full body rash      CBC    Component Value Date/Time   WBC 10.0 11/28/2022 1255   RBC 4.54 11/28/2022 1255   HGB 13.5 11/28/2022 1255   HCT 40.7 11/28/2022 1255   PLT 373 11/28/2022 1255   MCV 89.6 11/28/2022 1255   MCH 29.7 11/28/2022 1255   MCHC 33.2 11/28/2022 1255   RDW 12.6 11/28/2022 1255   LYMPHSABS 1.4 11/28/2022 1255   MONOABS 0.7 11/28/2022 1255   EOSABS 0.4 11/28/2022 1255   BASOSABS 0.1 11/28/2022 1255    Pulmonary Functions Testing Results:    Latest Ref Rng & Units 05/12/2022    9:34 AM  PFT Results  FVC-Pre L 3.13   FVC-Predicted Pre % 93   FVC-Post L 3.21   FVC-Predicted Post % 96   Pre FEV1/FVC % % 72   Post FEV1/FCV % % 75   FEV1-Pre L 2.27   FEV1-Predicted Pre % 86   FEV1-Post L 2.40   DLCO  uncorrected ml/min/mmHg 16.67   DLCO UNC% % 83   DLVA Predicted % 80   TLC L 5.20   TLC % Predicted % 106   RV % Predicted % 109     Outpatient Medications Prior to Visit  Medication Sig Dispense Refill   acetaminophen (TYLENOL) 500 MG tablet Take 500-1,000 mg by mouth every 6 (six) hours as needed (for pain.).     albuterol (PROVENTIL) (2.5 MG/3ML) 0.083% nebulizer solution Take 3 mLs (2.5 mg total) by  nebulization every 6 (six) hours as needed for wheezing or shortness of breath. 150 mL 1   albuterol (VENTOLIN HFA) 108 (90 Base) MCG/ACT inhaler Inhale 2 puffs into the lungs every 4 (four) hours as needed for wheezing or shortness of breath. 54 g 11   anastrozole (ARIMIDEX) 1 MG tablet Take 1 tablet (1 mg total) by mouth daily. 90 tablet 2   atorvastatin (LIPITOR) 40 MG tablet Take 1 tablet (40 mg total) by mouth daily. 90 tablet 3   budesonide-formoterol (SYMBICORT) 160-4.5 MCG/ACT inhaler Inhale 2 puffs into the lungs in the morning and at bedtime. 30.6 g 12   buPROPion (WELLBUTRIN XL) 300 MG 24 hr tablet Take 1 tablet (300 mg total) by mouth daily. 30 tablet 0   cetirizine (ZYRTEC) 10 MG tablet Take 1 tablet (10 mg total) by mouth daily as needed for allergies. 90 tablet 3   Cholecalciferol (VITAMIN D-3) 125 MCG (5000 UT) TABS Take 5,000 Units by mouth daily.     Cyanocobalamin (VITAMIN B-12 PO) Take 1 tablet by mouth daily. 1000mg  daily     DULoxetine (CYMBALTA) 60 MG capsule TAKE 1 CAPSULE BY MOUTH EVERY DAY 90 capsule 2   gabapentin (NEURONTIN) 300 MG capsule Take 1 capsule (300 mg total) by mouth at bedtime. 30 capsule 3   gabapentin (NEURONTIN) 600 MG tablet Take 1 tablet (600 mg total) by mouth 3 (three) times daily. 90 tablet 3   Iron, Ferrous Sulfate, 325 (65 Fe) MG TABS Take 325 mg by mouth daily. 90 tablet 3   omeprazole (PRILOSEC) 40 MG capsule Take 1 capsule (40 mg total) by mouth every evening. 30 min before food 90 capsule 3   senna (SENOKOT) 8.6 MG TABS tablet Take 1  tablet (8.6 mg total) by mouth 2 (two) times daily. 120 tablet 0   tiZANidine (ZANAFLEX) 4 MG tablet TAKE 1 TABLET (4 MG TOTAL) BY MOUTH EVERY 8 (EIGHT) HOURS AS NEEDED FOR MUSCLE SPASMS 60 tablet 0   traZODone (DESYREL) 50 MG tablet Take 1-1.5 tablets (50-75 mg total) by mouth at bedtime as needed for sleep. 140 tablet 11   valACYclovir (VALTREX) 1000 MG tablet Take 1 tablet (1,000 mg total) by mouth daily. Suppression. BID X 5 days with outbreak as needed (Patient taking differently: Take 1,000 mg by mouth as needed. Suppression. BID X 5 days with outbreak as needed) 120 tablet 3   No facility-administered medications prior to visit.

## 2022-12-30 ENCOUNTER — Ambulatory Visit (INDEPENDENT_AMBULATORY_CARE_PROVIDER_SITE_OTHER): Payer: Medicaid Other | Admitting: Family Medicine

## 2022-12-30 ENCOUNTER — Encounter: Payer: Self-pay | Admitting: Family Medicine

## 2022-12-30 VITALS — BP 120/70 | HR 90 | Temp 98.5°F | Ht 63.0 in | Wt 185.0 lb

## 2022-12-30 DIAGNOSIS — G4700 Insomnia, unspecified: Secondary | ICD-10-CM

## 2022-12-30 DIAGNOSIS — F39 Unspecified mood [affective] disorder: Secondary | ICD-10-CM

## 2022-12-30 DIAGNOSIS — Z23 Encounter for immunization: Secondary | ICD-10-CM | POA: Diagnosis not present

## 2022-12-30 MED ORDER — BUPROPION HCL ER (XL) 300 MG PO TB24
300.0000 mg | ORAL_TABLET | Freq: Every day | ORAL | 3 refills | Status: DC
Start: 2022-12-30 — End: 2023-10-27

## 2022-12-30 NOTE — Progress Notes (Unsigned)
SUBJECTIVE:   Chief Complaint  Patient presents with   Medical Management of Chronic Issues   HPI Presents for chronic disease management  Discussed the use of AI scribe software for clinical note transcription with the patient, who gave verbal consent to proceed.  History of Present Illness The patient, with a history of depression and anxiety, reports a recent increase in hot flashes, which are described as quick and tolerable. They note that these hot flashes do not result in excessive sweating as previously experienced. The patient is currently on Wellbutrin, Cymbalta, and Trazodone, and reports feeling okay with the current regimen. They have been taking Trazodone 50mg  at night for sleep, but express willingness to trial a reduced dose to assess if sleep can be maintained with less medication.  The patient also reports a recent craving for sugar and an increase in nocturnal hunger, which they manage by eating snacks during the night. Despite this, they report that their weight has remained stable.  In terms of their mental health, the patient reports feeling well on their current regimen of Wellbutrin and Cymbalta. They have not sought therapy due to a busy work schedule. The patient also mentions a recent bereavement in their workplace, which has increased their workload, but does not report any exacerbation of their mental health symptoms in relation to this event.  The patient also has a history of Arimidex use, which they attribute to their hot flashes. They express understanding that these symptoms may decrease once they discontinue the Arimidex, but do not indicate any current plans to do so.  In summary, the patient presents with manageable hot flashes and nocturnal hunger, and reports stability in their mental health on their current medication regimen.    PERTINENT PMH / PSH: As above  OBJECTIVE:  BP 120/70   Pulse 90   Temp 98.5 F (36.9 C) (Oral)   Ht 5\' 3"  (1.6 m)    Wt 185 lb (83.9 kg)   SpO2 97%   BMI 32.77 kg/m    Physical Exam Vitals reviewed.  Constitutional:      General: She is not in acute distress.    Appearance: Normal appearance. She is obese. She is not ill-appearing, toxic-appearing or diaphoretic.  Eyes:     General:        Right eye: No discharge.        Left eye: No discharge.     Conjunctiva/sclera: Conjunctivae normal.  Cardiovascular:     Rate and Rhythm: Normal rate and regular rhythm.     Heart sounds: Normal heart sounds.  Pulmonary:     Effort: Pulmonary effort is normal.     Breath sounds: Normal breath sounds.  Abdominal:     General: Bowel sounds are normal.  Musculoskeletal:        General: Normal range of motion.  Skin:    General: Skin is warm and dry.  Neurological:     General: No focal deficit present.     Mental Status: She is alert and oriented to person, place, and time. Mental status is at baseline.  Psychiatric:        Mood and Affect: Mood normal.        Behavior: Behavior normal.        Thought Content: Thought content normal.        Judgment: Judgment normal.        12/30/2022    8:21 AM 12/16/2022    8:54 AM 11/30/2022   10:59 AM  10/19/2022    1:04 PM 07/20/2022   10:50 AM  Depression screen PHQ 2/9  Decreased Interest 1 2 2 1 2   Down, Depressed, Hopeless 1 2 3 2 2   PHQ - 2 Score 2 4 5 3 4   Altered sleeping 3 3 3 3 3   Tired, decreased energy 1 3 3 1 3   Change in appetite 3 3 2 2 3   Feeling bad or failure about yourself  1 2 3 1 3   Trouble concentrating 2 2 3 2 2   Moving slowly or fidgety/restless 0 0 1 0 2  Suicidal thoughts 0 0 0 0 0  PHQ-9 Score 12 17 20 12 20   Difficult doing work/chores Somewhat difficult Somewhat difficult Somewhat difficult Somewhat difficult Somewhat difficult      12/30/2022    8:22 AM 12/16/2022    8:54 AM 11/30/2022   10:59 AM 10/19/2022    1:04 PM  GAD 7 : Generalized Anxiety Score  Nervous, Anxious, on Edge 1 2 3 3   Control/stop worrying 1 3 3 3    Worry too much - different things 1 3 3 3   Trouble relaxing 3 3 3 3   Restless 0 0 2 2  Easily annoyed or irritable 0 1 2 0  Afraid - awful might happen 0 2 3 3   Total GAD 7 Score 6 14 19 17   Anxiety Difficulty Somewhat difficult Somewhat difficult Very difficult Somewhat difficult    ASSESSMENT/PLAN:  Need for influenza vaccination -     Flu vaccine trivalent PF, 6mos and older(Flulaval,Afluria,Fluarix,Fluzone)  Mood disorder (HCC) Assessment & Plan: Stable on current regimen of Wellbutrin and Cymbalta. No increase in anxiety or sweats noted. -Continue Wellbutrin and Cymbalta at current doses. -Refill Wellbutrin for 90 days with 1 year of refills.  Orders: -     buPROPion HCl ER (XL); Take 1 tablet (300 mg total) by mouth daily.  Dispense: 90 tablet; Refill: 3  Insomnia, unspecified type Assessment & Plan: Currently managed with Trazodone 50mg  at night. Discussed potential to decrease dose. -Trial of no Trazodone on a non-work night. -If unable to sleep by 11pm, take Trazodone 50mg . -Trial of Trazodone 25mg  on the following night. -If unable to sleep by 11pm, take additional 25mg  of Trazodone. -Resume regular dose of Trazodone 50mg  on the third night.     General Health Maintenance -Received flu shot.   PDMP reviewed  Return if symptoms worsen or fail to improve, for PCP.  Dana Allan, MD

## 2022-12-30 NOTE — Patient Instructions (Addendum)
It was a pleasure meeting you today. Thank you for allowing me to take part in your health care.  Our goals for today as we discussed include:  Continue current medication Glad you are feeling well  Try cutting back on Trazodone as we discussed  Follow up as needed   If you have any questions or concerns, please do not hesitate to call the office at [phone].  I look forward to our next visit and until then take care and stay safe.  Regards,   Dana Allan, MD   [facility]

## 2023-01-03 DIAGNOSIS — Z23 Encounter for immunization: Secondary | ICD-10-CM | POA: Insufficient documentation

## 2023-01-03 NOTE — Assessment & Plan Note (Signed)
Stable on current regimen of Wellbutrin and Cymbalta. No increase in anxiety or sweats noted. -Continue Wellbutrin and Cymbalta at current doses. -Refill Wellbutrin for 90 days with 1 year of refills.

## 2023-01-03 NOTE — Assessment & Plan Note (Signed)
Currently managed with Trazodone 50mg  at night. Discussed potential to decrease dose. -Trial of no Trazodone on a non-work night. -If unable to sleep by 11pm, take Trazodone 50mg . -Trial of Trazodone 25mg  on the following night. -If unable to sleep by 11pm, take additional 25mg  of Trazodone. -Resume regular dose of Trazodone 50mg  on the third night.

## 2023-01-09 ENCOUNTER — Encounter: Payer: Self-pay | Admitting: Oncology

## 2023-01-16 ENCOUNTER — Ambulatory Visit: Payer: Medicaid Other | Attending: Otolaryngology

## 2023-01-16 DIAGNOSIS — G4733 Obstructive sleep apnea (adult) (pediatric): Secondary | ICD-10-CM | POA: Diagnosis not present

## 2023-01-16 DIAGNOSIS — E669 Obesity, unspecified: Secondary | ICD-10-CM | POA: Insufficient documentation

## 2023-01-16 DIAGNOSIS — Z6832 Body mass index (BMI) 32.0-32.9, adult: Secondary | ICD-10-CM | POA: Diagnosis not present

## 2023-01-16 DIAGNOSIS — R7303 Prediabetes: Secondary | ICD-10-CM | POA: Insufficient documentation

## 2023-01-16 DIAGNOSIS — R4189 Other symptoms and signs involving cognitive functions and awareness: Secondary | ICD-10-CM | POA: Diagnosis not present

## 2023-01-16 DIAGNOSIS — J45909 Unspecified asthma, uncomplicated: Secondary | ICD-10-CM | POA: Diagnosis not present

## 2023-01-19 ENCOUNTER — Encounter: Payer: Self-pay | Admitting: Family Medicine

## 2023-01-22 ENCOUNTER — Other Ambulatory Visit: Payer: Self-pay | Admitting: Family Medicine

## 2023-01-22 DIAGNOSIS — G4700 Insomnia, unspecified: Secondary | ICD-10-CM

## 2023-01-22 MED ORDER — TRAZODONE HCL 50 MG PO TABS
50.0000 mg | ORAL_TABLET | Freq: Every evening | ORAL | 3 refills | Status: DC | PRN
Start: 2023-01-22 — End: 2023-12-11

## 2023-01-24 ENCOUNTER — Telehealth (HOSPITAL_BASED_OUTPATIENT_CLINIC_OR_DEPARTMENT_OTHER): Payer: Medicaid Other | Admitting: Pulmonary Disease

## 2023-01-24 DIAGNOSIS — G4733 Obstructive sleep apnea (adult) (pediatric): Secondary | ICD-10-CM

## 2023-01-24 NOTE — Telephone Encounter (Signed)
Split study >> AHI 26/h, low sat 78% Corrected by CPAP 12 cm, small simplus mask

## 2023-01-24 NOTE — Telephone Encounter (Signed)
Patient is aware of results and voiced her understanding.  She will call to schedule f/u once setup on cpap.  Nothing further needed.

## 2023-01-24 NOTE — Telephone Encounter (Signed)
Pt informed

## 2023-01-24 NOTE — Addendum Note (Signed)
Addended byRaechel Chute on: 01/24/2023 03:54 PM   Modules accepted: Orders

## 2023-01-24 NOTE — Telephone Encounter (Signed)
Will await Dr. Dgayli's response.  

## 2023-01-25 ENCOUNTER — Encounter: Payer: Self-pay | Admitting: Student in an Organized Health Care Education/Training Program

## 2023-01-25 NOTE — Telephone Encounter (Signed)
Please advise 

## 2023-02-20 DIAGNOSIS — G4733 Obstructive sleep apnea (adult) (pediatric): Secondary | ICD-10-CM | POA: Diagnosis not present

## 2023-03-23 DIAGNOSIS — G4733 Obstructive sleep apnea (adult) (pediatric): Secondary | ICD-10-CM | POA: Diagnosis not present

## 2023-04-05 ENCOUNTER — Other Ambulatory Visit: Payer: Self-pay | Admitting: Oncology

## 2023-04-10 ENCOUNTER — Encounter: Payer: Self-pay | Admitting: Student in an Organized Health Care Education/Training Program

## 2023-04-10 ENCOUNTER — Encounter: Payer: Self-pay | Admitting: Oncology

## 2023-04-10 ENCOUNTER — Ambulatory Visit (INDEPENDENT_AMBULATORY_CARE_PROVIDER_SITE_OTHER): Payer: Medicaid Other | Admitting: Student in an Organized Health Care Education/Training Program

## 2023-04-10 VITALS — BP 120/80 | HR 91 | Temp 98.1°F | Ht 63.0 in | Wt 182.6 lb

## 2023-04-10 DIAGNOSIS — G4733 Obstructive sleep apnea (adult) (pediatric): Secondary | ICD-10-CM | POA: Diagnosis not present

## 2023-04-10 DIAGNOSIS — E66811 Obesity, class 1: Secondary | ICD-10-CM

## 2023-04-10 DIAGNOSIS — Z6831 Body mass index (BMI) 31.0-31.9, adult: Secondary | ICD-10-CM | POA: Diagnosis not present

## 2023-04-10 DIAGNOSIS — J4540 Moderate persistent asthma, uncomplicated: Secondary | ICD-10-CM

## 2023-04-10 MED ORDER — AEROCHAMBER MV MISC: 0 refills | Status: AC

## 2023-04-10 NOTE — Progress Notes (Signed)
Assessment & Plan:   #OSA #Class 1 obesity - BMI 32  Presenting for the evaluation of fatigue and daytime sleepiness, with high risk for sleep apnea. Underwent splint night sleep study and diagnosed with sleep apnea, AHI of 26. Started on CPAP 12 cmH2O with improvement in AHI on the compliance report. Does use the CPAP > 4 hours 60% of the nights. Counseled on the importance of compliance. Does feel like irritable when she was using the CPAP. AHI significantly improved on days with CPAP compliance.  -continue CPAP, 12 cmH2O -compliance encouraged  #Mild Intermittent Asthma  The patient has a history of childhood asthma that is active, requiring treatment with ICS/LABA controller therapy (Symbicort). She was started on Symbicort during a previous visit with significant response and improvement. She reports occasional use of the albuterol, around once a day. Allergen testing suggests Th2 asthma, and she is continuously exposed to dog dander. Again counseled on allergen avoidance. Will continue with Symbicort for now, and add spacer device to optimize medication deliver. Will hold off on de-escalation of ICS dose given continued use of albuterol.   Pulmonary function testing is consistent with asthma, and does not suggest a COPD overlap. I have counseled her on the importance of having her dogs sleep outside her bedroom and limit exposure.   -Continue Symbicort two puffs bid -Add spacer device   Return in about 6 months (around 10/08/2023).  I spent 30 minutes caring for this patient today, including preparing to see the patient, obtaining a medical history , reviewing a separately obtained history, performing a medically appropriate examination and/or evaluation, counseling and educating the patient/family/caregiver, ordering medications, tests, or procedures, and documenting clinical information in the electronic health record  Raechel Chute, MD [facility]   End of  visit medications:  Meds ordered this encounter  Medications   Spacer/Aero-Holding Chambers (AEROCHAMBER MV) inhaler    Sig: Use as instructed    Dispense:  1 each    Refill:  0     Current Outpatient Medications:    acetaminophen (TYLENOL) 500 MG tablet, Take 500-1,000 mg by mouth every 6 (six) hours as needed (for pain.)., Disp: , Rfl:    albuterol (PROVENTIL) (2.5 MG/3ML) 0.083% nebulizer solution, Take 3 mLs (2.5 mg total) by nebulization every 6 (six) hours as needed for wheezing or shortness of breath., Disp: 150 mL, Rfl: 1   albuterol (VENTOLIN HFA) 108 (90 Base) MCG/ACT inhaler, Inhale 2 puffs into the lungs every 4 (four) hours as needed for wheezing or shortness of breath., Disp: 54 g, Rfl: 11   anastrozole (ARIMIDEX) 1 MG tablet, Take 1 tablet (1 mg total) by mouth daily., Disp: 90 tablet, Rfl: 2   atorvastatin (LIPITOR) 40 MG tablet, Take 1 tablet (40 mg total) by mouth daily., Disp: 90 tablet, Rfl: 3   budesonide-formoterol (SYMBICORT) 160-4.5 MCG/ACT inhaler, Inhale 2 puffs into the lungs in the morning and at bedtime., Disp: 30.6 g, Rfl: 12   buPROPion (WELLBUTRIN XL) 300 MG 24 hr tablet, Take 1 tablet (300 mg total) by mouth daily., Disp: 90 tablet, Rfl: 3   cetirizine (ZYRTEC) 10 MG tablet, Take 1 tablet (10 mg total) by mouth daily as needed for allergies., Disp: 90 tablet, Rfl: 3   Cholecalciferol (VITAMIN D-3) 125 MCG (5000 UT) TABS, Take 5,000 Units by mouth daily., Disp: , Rfl:    Cyanocobalamin (VITAMIN B-12 PO), Take 1 tablet by mouth daily. 1000mg  daily, Disp: , Rfl:    DULoxetine (CYMBALTA)  60 MG capsule, TAKE 1 CAPSULE BY MOUTH EVERY DAY, Disp: 90 capsule, Rfl: 2   gabapentin (NEURONTIN) 300 MG capsule, Take 1 capsule (300 mg total) by mouth at bedtime., Disp: 30 capsule, Rfl: 3   gabapentin (NEURONTIN) 600 MG tablet, Take 1 tablet (600 mg total) by mouth 3 (three) times daily., Disp: 90 tablet, Rfl: 3   Iron, Ferrous Sulfate, 325 (65 Fe) MG TABS, Take 325 mg by  mouth daily., Disp: 90 tablet, Rfl: 3   omeprazole (PRILOSEC) 40 MG capsule, Take 1 capsule (40 mg total) by mouth every evening. 30 min before food, Disp: 90 capsule, Rfl: 3   senna (SENOKOT) 8.6 MG TABS tablet, Take 1 tablet (8.6 mg total) by mouth 2 (two) times daily., Disp: 120 tablet, Rfl: 0   Spacer/Aero-Holding Chambers (AEROCHAMBER MV) inhaler, Use as instructed, Disp: 1 each, Rfl: 0   tiZANidine (ZANAFLEX) 4 MG tablet, TAKE 1 TABLET (4 MG TOTAL) BY MOUTH EVERY 8 (EIGHT) HOURS AS NEEDED FOR MUSCLE SPASMS, Disp: 60 tablet, Rfl: 0   traZODone (DESYREL) 50 MG tablet, Take 1 tablet (50 mg total) by mouth at bedtime as needed for sleep., Disp: 90 tablet, Rfl: 3   valACYclovir (VALTREX) 1000 MG tablet, Take 1 tablet (1,000 mg total) by mouth daily. Suppression. BID X 5 days with outbreak as needed (Patient taking differently: Take 1,000 mg by mouth as needed. Suppression. BID X 5 days with outbreak as needed), Disp: 120 tablet, Rfl: 3   Subjective:   PATIENT ID: Leah [name] GENDER: female DOB: 1965/10/26, MRN: [id number]  Chief Complaint  Patient presents with   Follow-up    No cough, shortness of breath or wheezing.     HPI  The patient is a pleasant 58 year old female presenting for follow up of Asthma and OSA.  Since our last visit, she's had a sleep study showing AHI of 26, and was started on CPAP with good response. She continues to be compliant with Symbicort, using it twice a day. She continues to be exposed to dog dander. She is overall compliant with her CPAP, but hasn't used it for the past week. Mood is improved with use of CPAP, and she feels irritable when not using it. Using albuterol around once a day, and has occasional bouts of wheezing that responds well to albuterol. She is aware that she is allergic to dogs.   I initially saw her after a recent asthma exacerbation. Where she was seen in the ED. She was treated with nebulizers, steroids, and discharged with an  Advair HFA inhaler.  She has had multiple episodes similar to this throughout the summer of 2024 and has had 5 courses of prednisone to treat asthma exacerbations. No fevers or chills reported, no night sweats, no weight loss, no rash, and no chest pain. Patient has perennial allergies. She reports a runny nose and is currently using claritin for this. She's previously used flonase, but reports it makes her nose stuffier.    Her past medical history is notable for breast cancer status postresection, chemotherapy, and radiation therapy.  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  She reports multiple allergies.  She had asthma growing up. She has a son who has multiple allergies.   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  She is a non-smoker but did smoke in the past, quit in 2008.  She smoked for 20 years with around 30 to 40 pack years of smoking history on her.  She denies any illicit drug use and denies any vaping.  Ancillary information including prior medications, full medical/surgical/family/social histories, and PFTs (when available) are listed below and have been reviewed.   Review of Systems  Constitutional:  Negative for chills and fever.  Respiratory:  Negative for cough, hemoptysis, sputum production, shortness of breath and wheezing.   Cardiovascular:  Negative for chest pain, leg swelling and PND.  Skin:  Negative for rash.     Objective:   Vitals:   04/10/23 1018  BP: 120/80  Pulse: 91  Temp: 98.1 F (36.7 C)  TempSrc: Temporal  SpO2: 95%  Weight: 182 lb 9.6 oz (82.8 kg)  Height: 5\' 3"  (1.6 m)   95% on RA BMI Readings from Last 3 Encounters:  04/10/23 32.35 kg/m  12/30/22 32.77 kg/m  12/26/22 32.63 kg/m   Wt Readings from Last 3 Encounters:  04/10/23 182 lb 9.6 oz (82.8 kg)  12/30/22 185 lb (83.9 kg)  12/26/22 184 lb 3.2 oz (83.6 kg)    Physical Exam Constitutional:      General: She is not in acute distress.     Appearance: Normal appearance. She is obese. She is not ill-appearing.  HENT:     Head: Normocephalic.     Nose: Nose normal.     Mouth/Throat:     Mouth: Mucous membranes are moist.     Comments: Crowded oropharynx Cardiovascular:     Rate and Rhythm: Normal rate and regular rhythm.     Pulses: Normal pulses.     Heart sounds: Normal heart sounds.  Pulmonary:     Effort: Pulmonary effort is normal. No respiratory distress.     Breath sounds: Normal breath sounds. No wheezing or rales.  Abdominal:     Palpations: Abdomen is soft.  Musculoskeletal:        General: Normal range of motion.  Skin:    General: Skin is warm.  Neurological:     General: No focal deficit present.     Mental Status: She is alert and oriented to person, place, and time. Mental status is at baseline.       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 Raul Del    Family history of breast cancer    Family history of prostate cancer    Gastroesophageal reflux disease without esophagitis 04/09/2022   Herniated disc, cervical    s/p epidural injection   Herpes    History of kidney stones    Hyperlipidemia    Low back pain    Migraines    Personal history of chemotherapy    Personal history of radiation therapy    Pneumonia      Family History  Problem Relation Age of Onset   Breast cancer Mother        dx late 72s   Thyroid disease Mother    Colon polyps Mother    Prostate cancer Father        dx 73   Breast cancer Maternal Grandmother  dx late 37s   Osteoporosis Maternal Grandmother    Cancer Maternal Uncle        possibly, unsure type     Past  Surgical History:  Procedure Laterality Date   ANTERIOR CERVICAL DECOMP/DISCECTOMY FUSION N/A 07/29/2022   Procedure: C5-T1 ANTERIOR CERVICAL DISCECTOMY AND FUSION;  Surgeon: Venetia Night, MD;  Location: [facility] ORS;  Service: Neurosurgery;  Laterality: N/A;   APPLICATION OF INTRAOPERATIVE CT SCAN N/A 07/29/2022   Procedure: APPLICATION OF INTRAOPERATIVE CT SCAN;  Surgeon: Venetia Night, MD;  Location: [facility];  Service: Neurosurgery;  Laterality: N/A;   BREAST BIOPSY Left 12/25/2018   [place] BX, invasive mammary carcinoma    BREAST LUMPECTOMY     BREAST LUMPECTOMY WITH NEEDLE LOCALIZATION AND AXILLARY SENTINEL LYMPH NODE BX Left 01/16/2019   Procedure: LEFT BREAST LUMPECTOMY WITH NEEDLE LOCALIZATION AND SENTINEL LYMPH NODE BIOPSY;  Surgeon: Griselda Miner, MD;  Location: [facility];  Service: General;  Laterality: Left;   CESAREAN SECTION     COLONOSCOPY WITH PROPOFOL N/A 03/27/2017   Procedure: COLONOSCOPY WITH PROPOFOL;  Surgeon: Wyline Mood, MD;  Location: [facility] ENDOSCOPY;  Service: Gastroenterology;  Laterality: N/A;   COLONOSCOPY WITH PROPOFOL N/A 04/04/2022   Procedure: COLONOSCOPY WITH PROPOFOL;  Surgeon: Wyline Mood, MD;  Location: [facility] ENDOSCOPY;  Service: Gastroenterology;  Laterality: N/A;   ESOPHAGOGASTRODUODENOSCOPY     ESOPHAGOGASTRODUODENOSCOPY (EGD) WITH PROPOFOL N/A 03/27/2017   Procedure: ESOPHAGOGASTRODUODENOSCOPY (EGD) WITH PROPOFOL WITH DILATION;  Surgeon: Wyline Mood, MD;  Location: [facility] ENDOSCOPY;  Service: Gastroenterology;  Laterality: N/A;   PORT-A-CATH REMOVAL     PORTACATH PLACEMENT N/A 02/15/2019   Procedure: INSERTION PORT-A-CATH WITH POSSIBLE ULTRASOUND;  Surgeon: Griselda Miner, MD;  Location: [facility] ORS;  Service: General;  Laterality: N/A;   POSTERIOR CERVICAL FUSION/FORAMINOTOMY N/A 07/29/2022   Procedure: C2-T2 POSTERIOR SPINAL FUSION;  Surgeon: Venetia Night, MD;  Location: [facility];  Service: Neurosurgery;  Laterality: N/A;   RE-EXCISION OF BREAST  CANCER,SUPERIOR MARGINS Left 02/15/2019   Procedure: RE-EXCISION OF LEFT BREAST CANCER ANTERIOR MARGINS;  Surgeon: Griselda Miner, MD;  Location: [facility] ORS;  Service: General;  Laterality: Left;   THROAT SURGERY  2015   UMBILICAL HERNIA REPAIR  2006    x 2   w mesh per pt    Social History   Socioeconomic History   Marital status: Single    Spouse name: Not on file   Number of children: Not on file   Years of education: Not on file   Highest education level: Some college, no degree  Occupational History   Not on file  Tobacco Use   Smoking status: Former    Current packs/day: 0.00    Average packs/day: 2.0 packs/day for 20.0 years (40.0 ttl pk-yrs)    Types: Cigarettes    Start date: 06/02/1986    Quit date: 06/02/2006    Years since quitting: 16.8   Smokeless tobacco: Never  Vaping Use   Vaping status: Never Used  Substance and Sexual Activity   Alcohol use: Yes    Comment: rarely   Drug use: No   Sexual activity: Yes    Partners: Male  Other Topics Concern   Not on file  Social History Narrative   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 As of 02/26/17    Divorced.    Former smoker 20+ years quit in 1997  then started again for 5 years and quit 10-12 years ago as of 03/08/17    Social Drivers of Health   Financial Resource Strain: Low Risk  (12/29/2022)   Overall Financial Resource Strain (CARDIA)    Difficulty of Paying Living Expenses: Not very hard  Food Insecurity: Food Insecurity Present (12/29/2022)   Hunger Vital Sign    Worried About Running Out of Food in the Last Year: Sometimes true    Ran Out of Food in the Last Year: Sometimes true  Transportation Needs: No Transportation Needs (12/29/2022)   PRAPARE - Administrator, Civil Service (Medical): No    Lack of Transportation (Non-Medical): No  Physical Activity: Insufficiently Active (12/29/2022)    Exercise Vital Sign    Days of Exercise per Week: 3 days    Minutes of Exercise per Session: 20 min  Stress: Stress Concern Present (12/29/2022)   [employer] of Occupational Health - Occupational Stress Questionnaire    Feeling of Stress : To some extent  Social Connections: Socially Isolated (12/29/2022)   Social Connection and Isolation Panel [NHANES]    Frequency of Communication with Friends and Family: More than three times a week    Frequency of Social Gatherings with Friends and Family: Once a week    Attends Religious Services: Never    Database administrator or Organizations: No    Attends Engineer, structural: Not on file    Marital Status: Divorced  Intimate Partner Violence: Not At Risk (07/29/2022)   Humiliation, Afraid, Rape, and Kick questionnaire    Fear of Current or Ex-Partner: No    Emotionally Abused: No    Physically Abused: No    Sexually Abused: No     Allergies  Allergen Reactions   Hydrocodone-Acetaminophen Hives    All over her body.   Lactose Intolerance (Gi) Other (See Comments)    Bloating and GI upset   Cephalexin Itching   Penicillins Rash    Childhood reaction    Sulfa Antibiotics Rash    Full body rash      CBC    Component Value Date/Time   WBC 10.0 11/28/2022 1255   RBC 4.54 11/28/2022 1255   HGB 13.5 11/28/2022 1255   HCT 40.7 11/28/2022 1255   PLT 373 11/28/2022 1255   MCV 89.6 11/28/2022 1255   MCH 29.7 11/28/2022 1255   MCHC 33.2 11/28/2022 1255   RDW 12.6 11/28/2022 1255   LYMPHSABS 1.4 11/28/2022 1255   MONOABS 0.7 11/28/2022 1255   EOSABS 0.4 11/28/2022 1255   BASOSABS 0.1 11/28/2022 1255    Pulmonary Functions Testing Results:    Latest Ref Rng & Units 05/12/2022    9:34 AM  PFT Results  FVC-Pre L 3.13   FVC-Predicted Pre % 93   FVC-Post L 3.21   FVC-Predicted Post % 96   Pre FEV1/FVC % % 72   Post FEV1/FCV % % 75   FEV1-Pre L 2.27   FEV1-Predicted Pre % 86   FEV1-Post L 2.40   DLCO  uncorrected ml/min/mmHg 16.67   DLCO UNC% % 83   DLVA Predicted % 80   TLC L 5.20   TLC % Predicted % 106   RV % Predicted % 109     Outpatient Medications Prior to Visit  Medication Sig Dispense Refill   acetaminophen (TYLENOL) 500 MG tablet Take 500-1,000 mg by mouth every 6 (six) hours as needed (for pain.).     albuterol (PROVENTIL) (2.5 MG/3ML)  0.083% nebulizer solution Take 3 mLs (2.5 mg total) by nebulization every 6 (six) hours as needed for wheezing or shortness of breath. 150 mL 1   albuterol (VENTOLIN HFA) 108 (90 Base) MCG/ACT inhaler Inhale 2 puffs into the lungs every 4 (four) hours as needed for wheezing or shortness of breath. 54 g 11   anastrozole (ARIMIDEX) 1 MG tablet Take 1 tablet (1 mg total) by mouth daily. 90 tablet 2   atorvastatin (LIPITOR) 40 MG tablet Take 1 tablet (40 mg total) by mouth daily. 90 tablet 3   budesonide-formoterol (SYMBICORT) 160-4.5 MCG/ACT inhaler Inhale 2 puffs into the lungs in the morning and at bedtime. 30.6 g 12   buPROPion (WELLBUTRIN XL) 300 MG 24 hr tablet Take 1 tablet (300 mg total) by mouth daily. 90 tablet 3   cetirizine (ZYRTEC) 10 MG tablet Take 1 tablet (10 mg total) by mouth daily as needed for allergies. 90 tablet 3   Cholecalciferol (VITAMIN D-3) 125 MCG (5000 UT) TABS Take 5,000 Units by mouth daily.     Cyanocobalamin (VITAMIN B-12 PO) Take 1 tablet by mouth daily. 1000mg  daily     DULoxetine (CYMBALTA) 60 MG capsule TAKE 1 CAPSULE BY MOUTH EVERY DAY 90 capsule 2   gabapentin (NEURONTIN) 300 MG capsule Take 1 capsule (300 mg total) by mouth at bedtime. 30 capsule 3   gabapentin (NEURONTIN) 600 MG tablet Take 1 tablet (600 mg total) by mouth 3 (three) times daily. 90 tablet 3   Iron, Ferrous Sulfate, 325 (65 Fe) MG TABS Take 325 mg by mouth daily. 90 tablet 3   omeprazole (PRILOSEC) 40 MG capsule Take 1 capsule (40 mg total) by mouth every evening. 30 min before food 90 capsule 3   senna (SENOKOT) 8.6 MG TABS tablet Take 1  tablet (8.6 mg total) by mouth 2 (two) times daily. 120 tablet 0   tiZANidine (ZANAFLEX) 4 MG tablet TAKE 1 TABLET (4 MG TOTAL) BY MOUTH EVERY 8 (EIGHT) HOURS AS NEEDED FOR MUSCLE SPASMS 60 tablet 0   traZODone (DESYREL) 50 MG tablet Take 1 tablet (50 mg total) by mouth at bedtime as needed for sleep. 90 tablet 3   valACYclovir (VALTREX) 1000 MG tablet Take 1 tablet (1,000 mg total) by mouth daily. Suppression. BID X 5 days with outbreak as needed (Patient taking differently: Take 1,000 mg by mouth as needed. Suppression. BID X 5 days with outbreak as needed) 120 tablet 3   No facility-administered medications prior to visit.

## 2023-04-11 ENCOUNTER — Telehealth: Payer: Self-pay | Admitting: *Deleted

## 2023-04-11 MED ORDER — GABAPENTIN 300 MG PO CAPS: 300.0000 mg | ORAL_CAPSULE | Freq: Every day | ORAL | 3 refills | Status: DC

## 2023-04-11 NOTE — Telephone Encounter (Signed)
Pt needs 300 mg for bedtime

## 2023-04-11 NOTE — Telephone Encounter (Signed)
Called pt and let her know that we sent the med to her pharmacy

## 2023-04-12 DIAGNOSIS — R87610 Atypical squamous cells of undetermined significance on cytologic smear of cervix (ASC-US): Secondary | ICD-10-CM | POA: Diagnosis not present

## 2023-04-12 DIAGNOSIS — R87810 Cervical high risk human papillomavirus (HPV) DNA test positive: Secondary | ICD-10-CM | POA: Diagnosis not present

## 2023-04-12 DIAGNOSIS — N941 Unspecified dyspareunia: Secondary | ICD-10-CM | POA: Diagnosis not present

## 2023-04-12 NOTE — Telephone Encounter (Signed)
I think sherry took care of it

## 2023-04-19 ENCOUNTER — Other Ambulatory Visit: Payer: Self-pay

## 2023-04-19 DIAGNOSIS — G959 Disease of spinal cord, unspecified: Secondary | ICD-10-CM

## 2023-04-20 ENCOUNTER — Ambulatory Visit: Payer: Medicaid Other | Admitting: Neurosurgery

## 2023-04-20 ENCOUNTER — Ambulatory Visit
Admission: RE | Admit: 2023-04-20 | Discharge: 2023-04-20 | Disposition: A | Discharge status: Home / Self Care | Payer: Medicaid Other | Source: Ambulatory Visit | Attending: Neurosurgery | Admitting: Neurosurgery

## 2023-04-20 ENCOUNTER — Ambulatory Visit
Admission: RE | Admit: 2023-04-20 | Discharge: 2023-04-20 | Disposition: A | Discharge status: Home / Self Care | Payer: Medicaid Other | Attending: Neurosurgery | Admitting: Neurosurgery

## 2023-04-20 VITALS — BP 120/72 | Ht 63.0 in | Wt 181.0 lb

## 2023-04-20 DIAGNOSIS — Z981 Arthrodesis status: Secondary | ICD-10-CM

## 2023-04-20 DIAGNOSIS — G959 Disease of spinal cord, unspecified: Secondary | ICD-10-CM | POA: Insufficient documentation

## 2023-04-20 NOTE — Progress Notes (Signed)
   REFERRING PHYSICIAN:  Hope Merle, Md [address] [place],  [place] [address]  DOS: 07/29/22  ACDF C5-T1 with C2-T2 posterior spinal fusion   HISTORY OF PRESENT ILLNESS: Caridad Silveira is status post ACDF C5-T1 with C2-T2 posterior spinal fusion.   She is doing very well.  She occasionally has some numbness in her first and second digit, but overall is doing very well.  She is very pleased.    PHYSICAL EXAMINATION:  NEUROLOGICAL:  General: In no acute distress.   Awake, alert, oriented to person, place, and time.  Pupils equal round and reactive to light.  Facial tone is symmetric.    Strength: Side Biceps Triceps Deltoid Interossei Grip Wrist Ext. Wrist Flex.  R 5 5 5 5 5 5 5   L 5 5 5 5 5 5 5    Anterior incision healing well.   Posterior incision c/d/i    Imaging:  No complications noted  Assessment / Plan: Kaula Klenke is doing well s/p above surgery.   Her pain is much improved compared to preoperatively.  I am very pleased with her improvements.  We reviewed her activity limitations.  Will see her back as needed.  She has some symptoms of possible carpal tunnel syndrome which is currently not causing an issue for her.    If her symptoms worsen, we will be happy to see her back for evaluation.  I spent a total of 10 minutes in this patient's care today. This time was spent reviewing pertinent records including imaging studies, obtaining and confirming history, performing a directed evaluation, formulating and discussing my recommendations, and documenting the visit within the medical record.    [facility]

## 2023-04-21 ENCOUNTER — Other Ambulatory Visit: Payer: Self-pay

## 2023-04-21 MED ORDER — GABAPENTIN 300 MG PO CAPS: 300.0000 mg | ORAL_CAPSULE | Freq: Every day | ORAL | 3 refills | Status: DC

## 2023-04-23 DIAGNOSIS — G4733 Obstructive sleep apnea (adult) (pediatric): Secondary | ICD-10-CM | POA: Diagnosis not present

## 2023-04-25 DIAGNOSIS — N941 Unspecified dyspareunia: Secondary | ICD-10-CM | POA: Diagnosis not present

## 2023-04-28 ENCOUNTER — Encounter: Payer: Self-pay | Admitting: Oncology

## 2023-05-21 DIAGNOSIS — G4733 Obstructive sleep apnea (adult) (pediatric): Secondary | ICD-10-CM | POA: Diagnosis not present

## 2023-05-29 ENCOUNTER — Encounter: Payer: Self-pay | Admitting: Oncology

## 2023-05-29 ENCOUNTER — Inpatient Hospital Stay

## 2023-05-29 ENCOUNTER — Inpatient Hospital Stay: Payer: Medicaid Other | Attending: Oncology | Admitting: Oncology

## 2023-05-29 VITALS — BP 131/97 | HR 88 | Temp 97.3°F | Resp 18 | Ht 63.0 in | Wt 181.6 lb

## 2023-05-29 DIAGNOSIS — Z809 Family history of malignant neoplasm, unspecified: Secondary | ICD-10-CM | POA: Diagnosis not present

## 2023-05-29 DIAGNOSIS — M8580 Other specified disorders of bone density and structure, unspecified site: Secondary | ICD-10-CM | POA: Diagnosis not present

## 2023-05-29 DIAGNOSIS — Z5181 Encounter for therapeutic drug level monitoring: Secondary | ICD-10-CM | POA: Diagnosis not present

## 2023-05-29 DIAGNOSIS — Z803 Family history of malignant neoplasm of breast: Secondary | ICD-10-CM | POA: Diagnosis not present

## 2023-05-29 DIAGNOSIS — Z08 Encounter for follow-up examination after completed treatment for malignant neoplasm: Secondary | ICD-10-CM

## 2023-05-29 DIAGNOSIS — R5383 Other fatigue: Secondary | ICD-10-CM | POA: Insufficient documentation

## 2023-05-29 DIAGNOSIS — Z87891 Personal history of nicotine dependence: Secondary | ICD-10-CM | POA: Diagnosis not present

## 2023-05-29 DIAGNOSIS — Z79899 Other long term (current) drug therapy: Secondary | ICD-10-CM | POA: Diagnosis not present

## 2023-05-29 DIAGNOSIS — C50212 Malignant neoplasm of upper-inner quadrant of left female breast: Secondary | ICD-10-CM

## 2023-05-29 DIAGNOSIS — Z7951 Long term (current) use of inhaled steroids: Secondary | ICD-10-CM | POA: Insufficient documentation

## 2023-05-29 DIAGNOSIS — E611 Iron deficiency: Secondary | ICD-10-CM | POA: Insufficient documentation

## 2023-05-29 DIAGNOSIS — Z170 Estrogen receptor positive status [ER+]: Secondary | ICD-10-CM | POA: Insufficient documentation

## 2023-05-29 DIAGNOSIS — Z9221 Personal history of antineoplastic chemotherapy: Secondary | ICD-10-CM | POA: Insufficient documentation

## 2023-05-29 DIAGNOSIS — Z923 Personal history of irradiation: Secondary | ICD-10-CM | POA: Insufficient documentation

## 2023-05-29 DIAGNOSIS — D509 Iron deficiency anemia, unspecified: Secondary | ICD-10-CM | POA: Diagnosis not present

## 2023-05-29 DIAGNOSIS — Z79811 Long term (current) use of aromatase inhibitors: Secondary | ICD-10-CM

## 2023-05-29 LAB — CBC WITH DIFFERENTIAL (CANCER CENTER ONLY)
Abs Immature Granulocytes: 0.02 10*3/uL (ref 0.00–0.07)
Basophils Absolute: 0.1 10*3/uL (ref 0.0–0.1)
Basophils Relative: 1 %
Eosinophils Absolute: 0.4 10*3/uL (ref 0.0–0.5)
Eosinophils Relative: 6 %
HCT: 42.9 % (ref 36.0–46.0)
Hemoglobin: 14.8 g/dL (ref 12.0–15.0)
Immature Granulocytes: 0 %
Lymphocytes Relative: 27 %
Lymphs Abs: 1.7 10*3/uL (ref 0.7–4.0)
MCH: 31.8 pg (ref 26.0–34.0)
MCHC: 34.5 g/dL (ref 30.0–36.0)
MCV: 92.3 fL (ref 80.0–100.0)
Monocytes Absolute: 0.6 10*3/uL (ref 0.1–1.0)
Monocytes Relative: 10 %
Neutro Abs: 3.6 10*3/uL (ref 1.7–7.7)
Neutrophils Relative %: 56 %
Platelet Count: 323 10*3/uL (ref 150–400)
RBC: 4.65 MIL/uL (ref 3.87–5.11)
RDW: 12.1 % (ref 11.5–15.5)
WBC Count: 6.4 10*3/uL (ref 4.0–10.5)
nRBC: 0 % (ref 0.0–0.2)

## 2023-05-29 LAB — IRON AND TIBC
Iron: 92 ug/dL (ref 28–170)
Saturation Ratios: 26 % (ref 10.4–31.8)
TIBC: 351 ug/dL (ref 250–450)
UIBC: 259 ug/dL

## 2023-05-29 LAB — FERRITIN: Ferritin: 22 ng/mL (ref 11–307)

## 2023-05-31 ENCOUNTER — Other Ambulatory Visit: Payer: Self-pay | Admitting: Oncology

## 2023-06-01 ENCOUNTER — Encounter: Payer: Self-pay | Admitting: Oncology

## 2023-06-01 NOTE — Progress Notes (Signed)
 Hematology/Oncology Consult note [facility]  [phone] [phone]  Patient Care Team: Dana Allan, MD as PCP - General (Family Medicine) Griselda Miner, MD as Consulting Physician (General Surgery) Carmina Miller, MD as Referring Physician (Radiation Oncology) Jim Like, RN as Registered Nurse Creig Hines, MD as Consulting Physician (Oncology)   Name of the patient: Leah [name]  [id number]  11-08-1965   Date of visit: 06/01/23  Diagnosis- pathological prognostic stage Ia invasive mammary carcinoma pT1b pN1 cM0 ER/PR positive HER-2/neu negative s/p lumpectomy, adjuvant chemotherapy and radiation currently on Arimidex   Chief complaint/ Reason for visit-routine follow-up of breast cancer  Heme/Onc history: Patient is a 58 year old postmenopausal female G2 P2 L2 who  underwent screening bilateral mammogram which showed area of concern in her left breast.  This was followed by an ultrasound and Core biopsy.  Ultrasound showed 1 x 1.1 x 0.7 cm hypoechoic mass at the 10:30 position of the left breast.  Left axilla appeared normal.  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    Final pathology showed 9 mm grade 1 invasive mammary carcinoma 1 out of 2 lymph nodes involved with macro metastatic carcinoma 3.5 mm.  Anterior margin was focally positive for which patient underwent reexcision surgery with negative margins [id number] (sn)   MammaPrint score came back as high risk with a risk of recurrence of 29% at 10 years without chemotherapy   Patient completed 4 cycles of dose dense AC chemotherapy and 12 cycles of weekly Taxol.  She was started on letrozole in August 2021 but could not tolerate it due to diarrhea and is currently on Arimidex  Interval history-she is doing well present today and denies breast any complaints at this time.  No new aches and pains anywhere.   She does report ongoing fatigue.  ECOG PS- 0 Pain scale- 0   Review of systems- Review of Systems  Constitutional:  Negative for chills, fever, malaise/fatigue and weight loss.  HENT:  Negative for congestion, ear discharge and nosebleeds.   Eyes:  Negative for blurred vision.  Respiratory:  Negative for cough, hemoptysis, sputum production, shortness of breath and wheezing.   Cardiovascular:  Negative for chest pain, palpitations, orthopnea and claudication.  Gastrointestinal:  Negative for abdominal pain, blood in stool, constipation, diarrhea, heartburn, melena, nausea and vomiting.  Genitourinary:  Negative for dysuria, flank pain, frequency, hematuria and urgency.  Musculoskeletal:  Negative for back pain, joint pain and myalgias.  Skin:  Negative for rash.  Neurological:  Negative for dizziness, tingling, focal weakness, seizures, weakness and headaches.  Endo/Heme/Allergies:  Does not bruise/bleed easily.  Psychiatric/Behavioral:  Negative for depression and suicidal ideas. The patient does not have insomnia.       Allergies  Allergen Reactions   Hydrocodone-Acetaminophen Hives    All over her body.   Lactose Intolerance (Gi) Other (See Comments)    Bloating and GI upset   Cephalexin Itching   Penicillins Rash    Childhood reaction    Sulfa Antibiotics Rash    Full body rash      Past Medical History:  Diagnosis Date   Anxiety    Arthritis    Asthma    as a child   Breast cancer (HCC)    Carpal tunnel syndrome    R hand    Colon polyps    Depression    Dyspnea    Eosinophilic esophagitis  noted in [place] with GI path report 09/29/14    Esophageal stricture    s/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 Raul Del    Family history of breast cancer    Family history of prostate cancer    Gastroesophageal reflux disease without esophagitis 04/09/2022   Herniated disc, cervical    s/p epidural injection   Herpes    History of kidney  stones    Hyperlipidemia    Low back pain    Migraines    Personal history of chemotherapy    Personal history of radiation therapy    Pneumonia      Past Surgical History:  Procedure Laterality Date   ANTERIOR CERVICAL DECOMP/DISCECTOMY FUSION N/A 07/29/2022   Procedure: C5-T1 ANTERIOR CERVICAL DISCECTOMY AND FUSION;  Surgeon: Venetia Night, MD;  Location: [facility] ORS;  Service: Neurosurgery;  Laterality: N/A;   APPLICATION OF INTRAOPERATIVE CT SCAN N/A 07/29/2022   Procedure: APPLICATION OF INTRAOPERATIVE CT SCAN;  Surgeon: Venetia Night, MD;  Location: [facility];  Service: Neurosurgery;  Laterality: N/A;   BREAST BIOPSY Left 12/25/2018   [place] BX, invasive mammary carcinoma    BREAST LUMPECTOMY     BREAST LUMPECTOMY WITH NEEDLE LOCALIZATION AND AXILLARY SENTINEL LYMPH NODE BX Left 01/16/2019   Procedure: LEFT BREAST LUMPECTOMY WITH NEEDLE LOCALIZATION AND SENTINEL LYMPH NODE BIOPSY;  Surgeon: Griselda Miner, MD;  Location: [facility];  Service: General;  Laterality: Left;   CESAREAN SECTION     COLONOSCOPY WITH PROPOFOL N/A 03/27/2017   Procedure: COLONOSCOPY WITH PROPOFOL;  Surgeon: Wyline Mood, MD;  Location: [facility] ENDOSCOPY;  Service: Gastroenterology;  Laterality: N/A;   COLONOSCOPY WITH PROPOFOL N/A 04/04/2022   Procedure: COLONOSCOPY WITH PROPOFOL;  Surgeon: Wyline Mood, MD;  Location: [facility] ENDOSCOPY;  Service: Gastroenterology;  Laterality: N/A;   ESOPHAGOGASTRODUODENOSCOPY     ESOPHAGOGASTRODUODENOSCOPY (EGD) WITH PROPOFOL N/A 03/27/2017   Procedure: ESOPHAGOGASTRODUODENOSCOPY (EGD) WITH PROPOFOL WITH DILATION;  Surgeon: Wyline Mood, MD;  Location: [facility] ENDOSCOPY;  Service: Gastroenterology;  Laterality: N/A;   PORT-A-CATH REMOVAL     PORTACATH PLACEMENT N/A 02/15/2019   Procedure: INSERTION PORT-A-CATH WITH POSSIBLE ULTRASOUND;  Surgeon: Griselda Miner, MD;  Location: [facility] ORS;  Service: General;  Laterality: N/A;   POSTERIOR CERVICAL FUSION/FORAMINOTOMY N/A 07/29/2022    Procedure: C2-T2 POSTERIOR SPINAL FUSION;  Surgeon: Venetia Night, MD;  Location: [facility];  Service: Neurosurgery;  Laterality: N/A;   RE-EXCISION OF BREAST CANCER,SUPERIOR MARGINS Left 02/15/2019   Procedure: RE-EXCISION OF LEFT BREAST CANCER ANTERIOR MARGINS;  Surgeon: Griselda Miner, MD;  Location: [facility] ORS;  Service: General;  Laterality: Left;   THROAT SURGERY  2015   UMBILICAL HERNIA REPAIR  2006    x 2   w mesh per pt    Social History   Socioeconomic History   Marital status: Single    Spouse name: Not on file   Number of children: Not on file   Years of education: Not on file   Highest education level: Some college, no degree  Occupational History   Not on file  Tobacco Use   Smoking status: Former    Current packs/day: 0.00    Average packs/day: 2.0 packs/day for 20.0 years (40.0 ttl pk-yrs)    Types: Cigarettes    Start date: 06/02/1986    Quit date: 06/02/2006    Years since quitting: 17.0   Smokeless tobacco: Never  Vaping Use   Vaping status: Never Used  Substance and Sexual Activity   Alcohol  use: Yes    Comment: rarely   Drug use: No   Sexual activity: Yes    Partners: Male  Other Topics Concern   Not on file  Social History Narrative   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 As of 02/26/17    Divorced.    Former smoker 20+ years quit in 1997 then started again for 5 years and quit 10-12 years ago as of 03/08/17    Social Drivers of Health   Financial Resource Strain: Low Risk  (12/29/2022)   Overall Financial Resource Strain (CARDIA)    Difficulty of Paying Living Expenses: Not very hard  Food Insecurity: Food Insecurity Present (12/29/2022)   Hunger Vital Sign    Worried About Running Out of Food in the Last Year: Sometimes true    Ran Out of Food in the Last Year: Sometimes true  Transportation Needs: No Transportation Needs (12/29/2022)   PRAPARE -  Administrator, Civil Service (Medical): No    Lack of Transportation (Non-Medical): No  Physical Activity: Insufficiently Active (12/29/2022)   Exercise Vital Sign    Days of Exercise per Week: 3 days    Minutes of Exercise per Session: 20 min  Stress: Stress Concern Present (12/29/2022)   [employer] of Occupational Health - Occupational Stress Questionnaire    Feeling of Stress : To some extent  Social Connections: Socially Isolated (12/29/2022)   Social Connection and Isolation Panel [NHANES]    Frequency of Communication with Friends and Family: More than three times a week    Frequency of Social Gatherings with Friends and Family: Once a week    Attends Religious Services: Never    Database administrator or Organizations: No    Attends Engineer, structural: Not on file    Marital Status: Divorced  Intimate Partner Violence: Not At Risk (07/29/2022)   Humiliation, Afraid, Rape, and Kick questionnaire    Fear of Current or Ex-Partner: No    Emotionally Abused: No    Physically Abused: No    Sexually Abused: No    Family History  Problem Relation Age of Onset   Breast cancer Mother        dx late 62s   Thyroid disease Mother    Colon polyps Mother    Prostate cancer Father        dx 78   Breast cancer Maternal Grandmother        dx late 50s   Osteoporosis Maternal Grandmother    Cancer Maternal Uncle        possibly, unsure type     Current Outpatient Medications:    acetaminophen (TYLENOL) 500 MG tablet, Take 500-1,000 mg by mouth every 6 (six) hours as needed (for pain.)., Disp: , Rfl:    albuterol (PROVENTIL) (2.5 MG/3ML) 0.083% nebulizer solution, Take 3 mLs (2.5 mg total) by nebulization every 6 (six) hours as needed for wheezing or shortness of breath., Disp: 150 mL, Rfl: 1   albuterol (VENTOLIN HFA) 108 (90 Base) MCG/ACT inhaler, Inhale 2 puffs into the lungs every 4 (four) hours as needed for wheezing or shortness of breath., Disp: 54  g, Rfl: 11   anastrozole (ARIMIDEX) 1 MG tablet, Take 1 tablet (1 mg total) by mouth daily., Disp: 90 tablet, Rfl: 2   atorvastatin (LIPITOR) 40 MG tablet, Take 1 tablet (40  mg total) by mouth daily., Disp: 90 tablet, Rfl: 3   budesonide-formoterol (SYMBICORT) 160-4.5 MCG/ACT inhaler, Inhale 2 puffs into the lungs in the morning and at bedtime., Disp: 30.6 g, Rfl: 12   buPROPion (WELLBUTRIN XL) 300 MG 24 hr tablet, Take 1 tablet (300 mg total) by mouth daily., Disp: 90 tablet, Rfl: 3   cetirizine (ZYRTEC) 10 MG tablet, Take 1 tablet (10 mg total) by mouth daily as needed for allergies., Disp: 90 tablet, Rfl: 3   Cholecalciferol (VITAMIN D-3) 125 MCG (5000 UT) TABS, Take 5,000 Units by mouth daily., Disp: , Rfl:    Cyanocobalamin (VITAMIN B-12 PO), Take 1 tablet by mouth daily. 1000mg  daily, Disp: , Rfl:    DULoxetine (CYMBALTA) 60 MG capsule, TAKE 1 CAPSULE BY MOUTH EVERY DAY, Disp: 90 capsule, Rfl: 2   gabapentin (NEURONTIN) 300 MG capsule, TAKE 1 CAPSULE BY MOUTH EVERYDAY AT BEDTIME, Disp: 30 capsule, Rfl: 3   gabapentin (NEURONTIN) 300 MG capsule, Take 1 capsule (300 mg total) by mouth at bedtime., Disp: 30 capsule, Rfl: 3   Iron, Ferrous Sulfate, 325 (65 Fe) MG TABS, Take 325 mg by mouth daily., Disp: 90 tablet, Rfl: 3   omeprazole (PRILOSEC) 40 MG capsule, Take 1 capsule (40 mg total) by mouth every evening. 30 min before food, Disp: 90 capsule, Rfl: 3   senna (SENOKOT) 8.6 MG TABS tablet, Take 1 tablet (8.6 mg total) by mouth 2 (two) times daily., Disp: 120 tablet, Rfl: 0   Spacer/Aero-Holding Chambers (AEROCHAMBER MV) inhaler, Use as instructed, Disp: 1 each, Rfl: 0   tiZANidine (ZANAFLEX) 4 MG tablet, TAKE 1 TABLET (4 MG TOTAL) BY MOUTH EVERY 8 (EIGHT) HOURS AS NEEDED FOR MUSCLE SPASMS, Disp: 60 tablet, Rfl: 0   traZODone (DESYREL) 50 MG tablet, Take 1 tablet (50 mg total) by mouth at bedtime as needed for sleep., Disp: 90 tablet, Rfl: 3   valACYclovir (VALTREX) 1000 MG tablet, Take 1 tablet  (1,000 mg total) by mouth daily. Suppression. BID X 5 days with outbreak as needed (Patient taking differently: Take 1,000 mg by mouth as needed. Suppression. BID X 5 days with outbreak as needed), Disp: 120 tablet, Rfl: 3  Physical exam:  Vitals:   05/29/23 1313  BP: (!) 131/97  Pulse: 88  Resp: 18  Temp: (!) 97.3 F (36.3 C)  TempSrc: Tympanic  SpO2: 98%  Weight: 181 lb 9.6 oz (82.4 kg)  Height: 5\' 3"  (1.6 m)   Physical Exam Cardiovascular:     Rate and Rhythm: Normal rate and regular rhythm.     Heart sounds: Normal heart sounds.  Pulmonary:     Effort: Pulmonary effort is normal.     Breath sounds: Normal breath sounds.  Skin:    General: Skin is warm and dry.  Neurological:     Mental Status: She is alert and oriented to person, place, and time.   Breast exam was performed in seated and lying down position. Patient is status post left lumpectomy with a well-healed surgical scar. No evidence of any palpable masses. No evidence of axillary adenopathy. No evidence of any palpable masses or lumps in the right breast. No evidence of right axillary adenopathy      Latest Ref Rng & Units 11/28/2022   12:55 PM  CMP  Glucose 70 - 99 mg/dL 87   BUN 6 - 20 mg/dL 18   Creatinine 3.81 - 1.00 mg/dL 8.29   Sodium 937 - 169 mmol/L 137   Potassium 3.5 -  5.1 mmol/L 4.0   Chloride 98 - 111 mmol/L 103   CO2 22 - 32 mmol/L 29   Calcium 8.9 - 10.3 mg/dL 9.5   Total Protein 6.5 - 8.1 g/dL 7.0   Total Bilirubin 0.3 - 1.2 mg/dL 0.6   Alkaline Phos 38 - 126 U/L 65   AST 15 - 41 U/L 19   ALT 0 - 44 U/L 22       Latest Ref Rng & Units 05/29/2023    1:47 PM  CBC  WBC 4.0 - 10.5 K/uL 6.4   Hemoglobin 12.0 - 15.0 g/dL 56.3   Hematocrit 87.5 - 46.0 % 42.9   Platelets 150 - 400 K/uL 323     Assessment and plan- Patient is a 58 y.o. female with history of left breast cancer ER/PR positive HER2 negative stage I s/p lumpectomy adjuvant chemotherapy and radiation.  She is here for a routine  follow-up visit  Patient will continue to take Arimidex for 10 years ending in August 2031.  She is tolerating it well along with calcium and vitamin D.  Clinically she is doing well with no concerning signs and symptoms of recurrence based on today's exam.  I will see her back in 6 months with CBC ferritin and iron studies.  Will schedule her mammogram for September 2025.  With regards to fatigue we did check her CBC ferritin and iron studies today.  Although she is not anemic her iron levels are low with a ferritin of 22.  She would like a trial of IV iron.  Will give her a dose of Monoferric.  Discussed risks and benefits of IV iron including all but not limited to possible risk of infusion reaction.  Patient understands and agrees to proceed as planned   Visit Diagnosis 1. Encounter for follow-up surveillance of breast cancer   2. Iron deficiency anemia, unspecified iron deficiency anemia type      Dr. Owens Shark, MD, MPH [facility] [phone] 06/01/2023 10:32 AM

## 2023-06-05 ENCOUNTER — Inpatient Hospital Stay

## 2023-06-05 ENCOUNTER — Other Ambulatory Visit: Payer: Self-pay | Admitting: Oncology

## 2023-06-05 VITALS — BP 126/80 | HR 77 | Temp 96.8°F | Resp 18

## 2023-06-05 DIAGNOSIS — Z79899 Other long term (current) drug therapy: Secondary | ICD-10-CM | POA: Diagnosis not present

## 2023-06-05 DIAGNOSIS — M8580 Other specified disorders of bone density and structure, unspecified site: Secondary | ICD-10-CM | POA: Diagnosis not present

## 2023-06-05 DIAGNOSIS — C50212 Malignant neoplasm of upper-inner quadrant of left female breast: Secondary | ICD-10-CM | POA: Diagnosis not present

## 2023-06-05 DIAGNOSIS — Z7951 Long term (current) use of inhaled steroids: Secondary | ICD-10-CM | POA: Diagnosis not present

## 2023-06-05 DIAGNOSIS — Z803 Family history of malignant neoplasm of breast: Secondary | ICD-10-CM | POA: Diagnosis not present

## 2023-06-05 DIAGNOSIS — Z923 Personal history of irradiation: Secondary | ICD-10-CM | POA: Diagnosis not present

## 2023-06-05 DIAGNOSIS — E611 Iron deficiency: Secondary | ICD-10-CM | POA: Diagnosis not present

## 2023-06-05 DIAGNOSIS — Z87891 Personal history of nicotine dependence: Secondary | ICD-10-CM | POA: Diagnosis not present

## 2023-06-05 DIAGNOSIS — R5383 Other fatigue: Secondary | ICD-10-CM | POA: Diagnosis not present

## 2023-06-05 DIAGNOSIS — Z809 Family history of malignant neoplasm, unspecified: Secondary | ICD-10-CM | POA: Diagnosis not present

## 2023-06-05 DIAGNOSIS — Z170 Estrogen receptor positive status [ER+]: Secondary | ICD-10-CM | POA: Diagnosis not present

## 2023-06-05 DIAGNOSIS — Z79811 Long term (current) use of aromatase inhibitors: Secondary | ICD-10-CM | POA: Diagnosis not present

## 2023-06-05 DIAGNOSIS — Z9221 Personal history of antineoplastic chemotherapy: Secondary | ICD-10-CM | POA: Diagnosis not present

## 2023-06-05 MED ORDER — SODIUM CHLORIDE 0.9 % IV SOLN
Freq: Once | INTRAVENOUS | Status: AC
  Filled 2023-06-05: qty 250

## 2023-06-05 MED ORDER — SODIUM CHLORIDE 0.9 % IV SOLN
1000.0000 mg | Freq: Once | INTRAVENOUS | Status: AC
  Administered 2023-06-05: 1000 mg via INTRAVENOUS
  Filled 2023-06-05: qty 1000

## 2023-06-07 ENCOUNTER — Encounter: Payer: Self-pay | Admitting: Oncology

## 2023-06-07 ENCOUNTER — Other Ambulatory Visit: Payer: Self-pay

## 2023-06-07 NOTE — Telephone Encounter (Signed)
 Called patient, she stated the pharmacy reached out to her to let her know that they requested a 600mg  prescription for Gabapentin due to her taking 600mg  in the morning & at bedtime, along with the 300mg 's in between.  Sending 600mg  script today 06/07/23.

## 2023-06-21 DIAGNOSIS — G4733 Obstructive sleep apnea (adult) (pediatric): Secondary | ICD-10-CM | POA: Diagnosis not present

## 2023-06-30 ENCOUNTER — Encounter: Payer: Self-pay | Admitting: Oncology

## 2023-06-30 ENCOUNTER — Other Ambulatory Visit: Payer: Self-pay

## 2023-06-30 MED ORDER — ANASTROZOLE 1 MG PO TABS: 1.0000 mg | ORAL_TABLET | Freq: Every day | ORAL | 2 refills | Status: AC

## 2023-07-21 DIAGNOSIS — G4733 Obstructive sleep apnea (adult) (pediatric): Secondary | ICD-10-CM | POA: Diagnosis not present

## 2023-07-28 ENCOUNTER — Other Ambulatory Visit: Payer: Self-pay | Admitting: Oncology

## 2023-07-28 DIAGNOSIS — Z08 Encounter for follow-up examination after completed treatment for malignant neoplasm: Secondary | ICD-10-CM

## 2023-07-30 ENCOUNTER — Other Ambulatory Visit: Payer: Self-pay | Admitting: Family Medicine

## 2023-07-30 DIAGNOSIS — K219 Gastro-esophageal reflux disease without esophagitis: Secondary | ICD-10-CM

## 2023-08-03 ENCOUNTER — Encounter: Payer: Self-pay | Admitting: Oncology

## 2023-08-09 ENCOUNTER — Encounter: Payer: Self-pay | Admitting: Neurosurgery

## 2023-08-10 NOTE — Telephone Encounter (Signed)
 Per Loyce Ruffini, can schedule with her or Brooke.

## 2023-08-11 ENCOUNTER — Encounter: Payer: Self-pay | Admitting: Oncology

## 2023-08-11 MED ORDER — PAROXETINE HCL 10 MG PO TABS
ORAL_TABLET | ORAL | 2 refills | Status: DC
Start: 2023-08-11 — End: 2023-09-04

## 2023-08-21 NOTE — Progress Notes (Unsigned)
 Referring Physician:  Valli Gaw, MD [address] [place],  [place] [address]  Primary Physician:  Valli Gaw, MD  History of Present Illness: 08/21/2023 [name] is here today with a chief complaint of ***  Carpal Tunnel    Duration: *** Location: *** Quality: *** Severity: ***  Precipitating: aggravated by *** Modifying factors: made better by *** Weakness: none Timing: *** Bowel/Bladder Dysfunction: none  Conservative measures:  Physical therapy: has not participated in PT  Multimodal medical therapy including regular antiinflammatories: Tylenol , Gabapentin , Tizanidine   Injections: no epidural steroid injections  Past Surgery: none  Art Largo has ***no symptoms of cervical myelopathy.  The symptoms are causing a significant impact on the patient's life.   Review of Systems:  A 10 point review of systems is negative, except for the pertinent positives and negatives detailed in the HPI.  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 Liddie Reel    Family history of breast cancer    Family history of prostate cancer    Gastroesophageal reflux disease without esophagitis 04/09/2022   Herniated disc, cervical    s/p epidural injection   Herpes    History of kidney stones    Hyperlipidemia    Low back pain    Migraines    Personal history of chemotherapy    Personal history of radiation therapy    Pneumonia     Past Surgical History: Past Surgical History:  Procedure Laterality Date   ANTERIOR CERVICAL DECOMP/DISCECTOMY FUSION N/A 07/29/2022   Procedure: C5-T1 ANTERIOR CERVICAL DISCECTOMY AND FUSION;   Surgeon: Jodeen Munch, MD;  Location: [facility] ORS;  Service: Neurosurgery;  Laterality: N/A;   APPLICATION OF INTRAOPERATIVE CT SCAN N/A 07/29/2022   Procedure: APPLICATION OF INTRAOPERATIVE CT SCAN;  Surgeon: Jodeen Munch, MD;  Location: [facility];  Service: Neurosurgery;  Laterality: N/A;   BREAST BIOPSY Left 12/25/2018   US  BX, invasive mammary carcinoma    BREAST LUMPECTOMY     BREAST LUMPECTOMY WITH NEEDLE LOCALIZATION AND AXILLARY SENTINEL LYMPH NODE BX Left 01/16/2019   Procedure: LEFT BREAST LUMPECTOMY WITH NEEDLE LOCALIZATION AND SENTINEL LYMPH NODE BIOPSY;  Surgeon: Caralyn Chandler, MD;  Location: [facility];  Service: General;  Laterality: Left;   CESAREAN SECTION     COLONOSCOPY WITH PROPOFOL  N/A 03/27/2017   Procedure: COLONOSCOPY WITH PROPOFOL ;  Surgeon: Luke Salaam, MD;  Location: [facility] ENDOSCOPY;  Service: Gastroenterology;  Laterality: N/A;   COLONOSCOPY WITH PROPOFOL  N/A 04/04/2022   Procedure: COLONOSCOPY WITH PROPOFOL ;  Surgeon: Luke Salaam, MD;  Location: [facility] ENDOSCOPY;  Service: Gastroenterology;  Laterality: N/A;   ESOPHAGOGASTRODUODENOSCOPY     ESOPHAGOGASTRODUODENOSCOPY (EGD) WITH PROPOFOL  N/A 03/27/2017   Procedure: ESOPHAGOGASTRODUODENOSCOPY (EGD) WITH PROPOFOL  WITH DILATION;  Surgeon: Luke Salaam, MD;  Location: [facility] ENDOSCOPY;  Service: Gastroenterology;  Laterality: N/A;   PORT-A-CATH REMOVAL     PORTACATH PLACEMENT N/A 02/15/2019   Procedure: INSERTION PORT-A-CATH WITH POSSIBLE ULTRASOUND;  Surgeon: Caralyn Chandler, MD;  Location: [facility];  Service: General;  Laterality: N/A;  POSTERIOR CERVICAL FUSION/FORAMINOTOMY N/A 07/29/2022   Procedure: C2-T2 POSTERIOR SPINAL FUSION;  Surgeon: Jodeen Munch, MD;  Location: [facility];  Service: Neurosurgery;  Laterality: N/A;   RE-EXCISION OF BREAST CANCER,SUPERIOR MARGINS Left 02/15/2019   Procedure: RE-EXCISION OF LEFT BREAST CANCER ANTERIOR MARGINS;  Surgeon: Caralyn Chandler, MD;  Location: [facility] ORS;  Service:  General;  Laterality: Left;   THROAT SURGERY  2015   UMBILICAL HERNIA REPAIR  2006    x 2   w mesh per pt    Allergies: Allergies as of 08/23/2023 - Review Complete 05/29/2023  Allergen Reaction Noted   Hydrocodone -acetaminophen  Hives 01/24/2019   Lactose intolerance (gi) Other (See Comments) 01/10/2019   Cephalexin  Itching 08/16/2022   Penicillins Rash 06/01/2016   Sulfa antibiotics Rash 06/01/2016    Medications: Outpatient Encounter Medications as of 08/23/2023  Medication Sig   acetaminophen  (TYLENOL ) 500 MG tablet Take 500-1,000 mg by mouth every 6 (six) hours as needed (for pain.).   albuterol  (PROVENTIL ) (2.5 MG/3ML) 0.083% nebulizer solution Take 3 mLs (2.5 mg total) by nebulization every 6 (six) hours as needed for wheezing or shortness of breath.   albuterol  (VENTOLIN  HFA) 108 (90 Base) MCG/ACT inhaler Inhale 2 puffs into the lungs every 4 (four) hours as needed for wheezing or shortness of breath.   anastrozole  (ARIMIDEX ) 1 MG tablet Take 1 tablet (1 mg total) by mouth daily.   atorvastatin  (LIPITOR) 40 MG tablet Take 1 tablet (40 mg total) by mouth daily.   budesonide -formoterol  (SYMBICORT ) 160-4.5 MCG/ACT inhaler Inhale 2 puffs into the lungs in the morning and at bedtime.   buPROPion  (WELLBUTRIN  XL) 300 MG 24 hr tablet Take 1 tablet (300 mg total) by mouth daily.   cetirizine  (ZYRTEC ) 10 MG tablet Take 1 tablet (10 mg total) by mouth daily as needed for allergies.   Cholecalciferol  (VITAMIN D -3) 125 MCG (5000 UT) TABS Take 5,000 Units by mouth daily.   Cyanocobalamin  (VITAMIN B-12 PO) Take 1 tablet by mouth daily. 1000mg  daily   gabapentin  (NEURONTIN ) 300 MG capsule TAKE 1 CAPSULE BY MOUTH EVERYDAY AT BEDTIME   gabapentin  (NEURONTIN ) 300 MG capsule Take 1 capsule (300 mg total) by mouth at bedtime.   gabapentin  (NEURONTIN ) 600 MG tablet TAKE 1 TABLET BY MOUTH THREE TIMES A DAY   Iron , Ferrous Sulfate , 325 (65 Fe) MG TABS Take 325 mg by mouth daily.   omeprazole   (PRILOSEC) 40 MG capsule TAKE 1 CAPSULE (40 MG TOTAL) BY MOUTH EVERY EVENING. 30 MIN BEFORE FOOD   PARoxetine  (PAXIL ) 10 MG tablet Take 1 tablet (10 mg total) by mouth at bedtime   senna (SENOKOT) 8.6 MG TABS tablet Take 1 tablet (8.6 mg total) by mouth 2 (two) times daily.   Spacer/Aero-Holding Chambers (AEROCHAMBER MV) inhaler Use as instructed   tiZANidine  (ZANAFLEX ) 4 MG tablet TAKE 1 TABLET (4 MG TOTAL) BY MOUTH EVERY 8 (EIGHT) HOURS AS NEEDED FOR MUSCLE SPASMS   traZODone  (DESYREL ) 50 MG tablet Take 1 tablet (50 mg total) by mouth at bedtime as needed for sleep.   valACYclovir  (VALTREX ) 1000 MG tablet Take 1 tablet (1,000 mg total) by mouth daily. Suppression. BID X 5 days with outbreak as needed (Patient taking differently: Take 1,000 mg by mouth as needed. Suppression. BID X 5 days with outbreak as needed)   No facility-administered encounter medications on file as of 08/23/2023.    Social History: Social History   Tobacco Use   Smoking status: Former    Current packs/day: 0.00  Average packs/day: 2.0 packs/day for 20.0 years (40.0 ttl pk-yrs)    Types: Cigarettes    Start date: 06/02/1986    Quit date: 06/02/2006    Years since quitting: 17.2   Smokeless tobacco: Never  Vaping Use   Vaping status: Never Used  Substance Use Topics   Alcohol use: Yes    Comment: rarely   Drug use: No    Family Medical History: Family History  Problem Relation Age of Onset   Breast cancer Mother        dx late 49s   Thyroid  disease Mother    Colon polyps Mother    Prostate cancer Father        dx 23   Breast cancer Maternal Grandmother        dx late 22s   Osteoporosis Maternal Grandmother    Cancer Maternal Uncle        possibly, unsure type    Physical Examination: @VITALWITHPAIN @  General: Patient is well developed, well nourished, calm, collected, and in no apparent distress. Attention to examination is appropriate.  Psychiatric: Patient is non-anxious.  Head:  Pupils  equal, round, and reactive to light.  ENT:  Oral mucosa appears well hydrated.  Neck:   Supple.  ***Full range of motion.  Respiratory: Patient is breathing without any difficulty.  Extremities: No edema.  Vascular: Palpable dorsal pedal pulses.  Skin:   On exposed skin, there are no abnormal skin lesions.  NEUROLOGICAL:     Awake, alert, oriented to person, place, and time.  Speech is clear and fluent. Fund of knowledge is appropriate.   Cranial Nerves: Pupils equal round and reactive to light.  Facial tone is symmetric.  Facial sensation is symmetric.  ROM of spine: ***full.  Palpation of spine: ***non tender.    Strength: Side Biceps Triceps Deltoid Interossei Grip Wrist Ext. Wrist Flex.  R 5 5 5 5 5 5 5   L 5 5 5 5 5 5 5    Side Iliopsoas Quads Hamstring PF DF EHL  R 5 5 5 5 5 5   L 5 5 5 5 5 5    Reflexes are ***2+ and symmetric at the biceps, triceps, brachioradialis, patella and achilles.   Hoffman's is absent.  Clonus is not present.  Toes are down-going.  Bilateral upper and lower extremity sensation is intact to light touch.    Gait is normal.   No difficulty with tandem gait.   No evidence of dysmetria noted.  Medical Decision Making  Imaging: ***  I have personally reviewed the images and agree with the above interpretation.  Assessment and Plan: Ms. Autrey is a pleasant 58 y.o. female with ***    Thank you for involving me in the care of this patient.   I spent a total of *** minutes in both face-to-face and non-face-to-face activities for this visit on the date of this encounter.   Ludwig Safer, PA-C Dept. of Neurosurgery

## 2023-08-23 ENCOUNTER — Ambulatory Visit (INDEPENDENT_AMBULATORY_CARE_PROVIDER_SITE_OTHER): Admitting: Physician Assistant

## 2023-08-23 ENCOUNTER — Encounter: Payer: Self-pay | Admitting: Physician Assistant

## 2023-08-23 VITALS — BP 142/88 | Ht 63.0 in | Wt 184.0 lb

## 2023-08-23 DIAGNOSIS — R200 Anesthesia of skin: Secondary | ICD-10-CM

## 2023-08-23 DIAGNOSIS — R202 Paresthesia of skin: Secondary | ICD-10-CM

## 2023-08-31 ENCOUNTER — Other Ambulatory Visit: Payer: Self-pay | Admitting: Family Medicine

## 2023-08-31 DIAGNOSIS — E785 Hyperlipidemia, unspecified: Secondary | ICD-10-CM

## 2023-09-01 ENCOUNTER — Other Ambulatory Visit: Payer: Self-pay

## 2023-09-01 DIAGNOSIS — J4540 Moderate persistent asthma, uncomplicated: Secondary | ICD-10-CM

## 2023-09-01 MED ORDER — BUDESONIDE-FORMOTEROL FUMARATE 160-4.5 MCG/ACT IN AERO: 2.0000 | INHALATION_SPRAY | Freq: Two times a day (BID) | RESPIRATORY_TRACT | 2 refills | Status: DC

## 2023-09-03 ENCOUNTER — Other Ambulatory Visit: Payer: Self-pay | Admitting: Oncology

## 2023-09-04 ENCOUNTER — Encounter: Payer: Self-pay | Admitting: Oncology

## 2023-09-07 ENCOUNTER — Encounter: Payer: Self-pay | Admitting: Neurology

## 2023-09-07 ENCOUNTER — Other Ambulatory Visit: Payer: Self-pay

## 2023-09-07 DIAGNOSIS — R202 Paresthesia of skin: Secondary | ICD-10-CM

## 2023-09-22 ENCOUNTER — Ambulatory Visit (INDEPENDENT_AMBULATORY_CARE_PROVIDER_SITE_OTHER): Admitting: Neurology

## 2023-09-22 DIAGNOSIS — R202 Paresthesia of skin: Secondary | ICD-10-CM | POA: Diagnosis not present

## 2023-09-22 DIAGNOSIS — M5412 Radiculopathy, cervical region: Secondary | ICD-10-CM

## 2023-09-22 DIAGNOSIS — G5603 Carpal tunnel syndrome, bilateral upper limbs: Secondary | ICD-10-CM

## 2023-09-22 NOTE — Procedures (Signed)
 [facility]  [address] [place], [address]  [place], [place] [address] Tel: [phone] Fax: [phone] Test Date:  09/22/2023  Patient: Leah [name] DOB: [birth year] Physician: Tonita Blanch, DO  Sex: Female Height: 5' 3 Ref Phys: Bobie Decamp, DEVONNA  ID#: [id number]   Technician:    History: This is a 58 year old female referred for evaluation of bilateral upper extremity paresthesias.  NCV & EMG Findings: Extensive electrodiagnostic testing of the right upper extremity and additional studies of the left shows:  Bilateral median sensory responses show prolonged latency (L4.6, R3.8 ms) and reduced amplitude (R13.3, L6.6 V).  Bilateral ulnar sensory responses are within normal limits. Bilateral median motor responses show prolonged latency (R4.2, L4.4 ms).  Bilateral ulnar motor responses are within normal limits.  Chronic motor axonal loss changes are seen affecting the right C7 and left C8 myotomes, without accompanying active denervation.  Impression: Bilateral median neuropathy at or distal to the wrist, consistent with a clinical diagnosis of carpal tunnel syndrome.  Overall, these findings are moderate-to-severe in degree electrically. Chronic right C7 radiculopathy, moderate. Chronic left C8 radiculopathy, moderate   ___________________________ Tonita Blanch, DO    Nerve Conduction Studies   Stim Site NR Peak (ms) Norm Peak (ms) O-P Amp (V) Norm O-P Amp  Left Median Anti Sensory (2nd Digit)  32 C  Wrist    *4.6 <3.6 *6.6 >15  Right Median Anti Sensory (2nd Digit)  32 C  Wrist    *3.8 <3.6 *13.6 >15  Left Ulnar Anti Sensory (5th Digit)  32 C  Wrist    2.4 <3.1 26.9 >10  Right Ulnar Anti Sensory (5th Digit)  32 C  Wrist    2.3 <3.1 20.8 >10     Stim Site NR Onset (ms) Norm Onset (ms) O-P Amp (mV) Norm O-P Amp Site1 Site2 Delta-0 (ms) Dist (cm) Vel (m/s) Norm Vel (m/s)  Left Median Motor (Abd Poll Brev)  32 C  Wrist    *4.4 <4.0 7.6 >6 Elbow Wrist  4.7 29.0 62 >50  Elbow    9.1  6.8         Right Median Motor (Abd Poll Brev)  32 C  Wrist    *4.2 <4.0 8.0 >6 Elbow Wrist 4.4 29.0 66 >50  Elbow    8.6  7.2         Left Ulnar Motor (Abd Dig Minimi)  32 C  Wrist    1.7 <3.1 9.1 >7 B Elbow Wrist 3.5 21.0 60 >50  B Elbow    5.2  8.9  A Elbow B Elbow 1.5 10.0 67 >50  A Elbow    6.7  8.8         Right Ulnar Motor (Abd Dig Minimi)  32 C  Wrist    2.0 <3.1 9.7 >7 B Elbow Wrist 3.5 23.0 66 >50  B Elbow    5.5  9.2  A Elbow B Elbow 1.5 10.0 67 >50  A Elbow    7.0  9.1          Electromyography   Side Muscle Ins.Act Fibs Fasc Recrt Amp Dur Poly Activation Comment  Right 1stDorInt Nml Nml Nml Nml Nml Nml Nml Nml N/A  Right Abd Poll Brev Nml Nml Nml Nml Nml Nml Nml Nml N/A  Right PronatorTeres Nml Nml Nml *2- *1+ *1+ *1+ Nml N/A  Right Biceps Nml Nml Nml Nml Nml Nml Nml Nml N/A  Right Triceps Nml Nml Nml *2- *1+ *1+ *  1+ Nml N/A  Right Deltoid Nml Nml Nml Nml Nml Nml Nml Nml N/A  Left 1stDorInt Nml Nml Nml *2- *1+ *1+ *1+ Nml N/A  Left Abd Poll Brev Nml Nml Nml Nml Nml Nml Nml Nml N/A  Left PronatorTeres Nml Nml Nml Nml Nml Nml Nml Nml N/A  Left Biceps Nml Nml Nml Nml Nml Nml Nml Nml N/A  Left Triceps Nml Nml Nml *2- *1+ *1+ *1+ Nml N/A  Left Deltoid Nml Nml Nml Nml Nml Nml Nml Nml N/A  Left Ext Indicis Nml Nml Nml *2- *1+ *1+ *1+ Nml N/A      Waveforms:

## 2023-09-27 ENCOUNTER — Ambulatory Visit: Admitting: Physician Assistant

## 2023-09-27 DIAGNOSIS — G5603 Carpal tunnel syndrome, bilateral upper limbs: Secondary | ICD-10-CM

## 2023-09-27 NOTE — Progress Notes (Signed)
 History: This is a 58 year old female referred for evaluation of bilateral upper extremity paresthesias.   NCV & EMG Findings: Extensive electrodiagnostic testing of the right upper extremity and additional studies of the left shows:  Bilateral median sensory responses show prolonged latency (L4.6, R3.8 ms) and reduced amplitude (R13.3, L6.6 V).  Bilateral ulnar sensory responses are within normal limits. Bilateral median motor responses show prolonged latency (R4.2, L4.4 ms).  Bilateral ulnar motor responses are within normal limits.  Chronic motor axonal loss changes are seen affecting the right C7 and left C8 myotomes, without accompanying active denervation.   Impression: Bilateral median neuropathy at or distal to the wrist, consistent with a clinical diagnosis of carpal tunnel syndrome.  Overall, these findings are moderate-to-severe in degree electrically. Chronic right C7 radiculopathy, moderate. Chronic left C8 radiculopathy, moderate  Leah [name] has been having bilateral hand numbness and tingling, right worse than left.  Previous history of C2-T2 posterior spinal fusion and C5-T1 ACDF.  She states this has been ongoing for 10 to 15 years, but it is become worse recently.  She notices when she is cleaning, driving, using the computer she has an increase in pain.  She feels as though she has a lot of pain in the padding near her thumb but then will get numbness and tingling in her first 3 digits.   Spoke with patient regarding EMG results which showed moderate to severe bilateral median neuropathy.  She would like to undergo surgery to help resolve her symptoms.  The risk and benefits of developing carpal tunnel release were discussed at length including risk of infection or nerve damage.  She agrees to proceed with ultrasound-guided carpal tunnel release with the possibility of an open release if an ultrasound-guided release is not able to be completed.  She would like to do her left  hand prior to her right.  We will work on coordinating her surgery as soon as possible.    This visit was performed via telephone.  Patient location: home Provider location: office  I spent a total of 10 minutes non-face-to-face activities for this visit on the date of this encounter including review of current clinical condition and response to treatment.  The patient is aware of and accepts the limits of this telehealth visit.

## 2023-09-28 ENCOUNTER — Telehealth: Payer: Self-pay

## 2023-09-28 DIAGNOSIS — G5602 Carpal tunnel syndrome, left upper limb: Secondary | ICD-10-CM

## 2023-09-28 NOTE — Telephone Encounter (Signed)
 Planned surgery: left carpal tunnel release with ultrasound guidance, possible convert to open   Surgery date: 10/26/23 at [facility]: [address] [place], [place] [address]) - you will find out your arrival time the business day before your surgery.   Pre-op appointment at [facility] Pre-admit Testing: you will receive a call with a date/time for this appointment. If you are scheduled for an in person appointment, Pre-admit Testing is located on the first floor of the [facility], [address], [address]. During this appointment, they will advise you which medications you can take the morning of surgery, and which medications you will need to hold for surgery. Labs (such as blood work, EKG) may be done at your pre-op appointment. You are not required to fast for these labs. Should you need to change your pre-op appointment, please call Pre-admit testing at [phone].     How to contact us :  If you have any questions/concerns before or after surgery, you can reach us  at [phone], or you can send a mychart message. We can be reached by phone or mychart 8am-4pm, Monday-Friday.  *Please note: Calls after 4pm are forwarded to a third party answering service. Mychart messages are not routinely monitored during evenings, weekends, and holidays. Please call our office to contact the answering service for urgent concerns during non-business hours.    If you have FMLA/disability paperwork, please drop it off or fax it to [phone]   Appointments/FMLA & disability paperwork: [employer] Registered Nurse/Surgery scheduler: Cederic Mozley, RN Certified Medical Assistants: Don, CMA, Elenor, CMA, & Damien, CMA Physician Assistants: Lyle Decamp, PA-C, Edsel Goods, PA-C & Glade Boys, PA-C Surgeons: Penne Sharps, MD & Reeves Daisy, MD   [facility] PREADMIT TESTING VISIT and SURGERY  INFORMATION SHEET   Now that surgery has been scheduled you can anticipate several phone calls from [facility] services. A pharmacy technician will call you to verify your current list of medications taken at home.               The Pre-Service Center will call to verify your insurance information and to give you billing estimates and information.             The Preadmit Testing Office will be calling to schedule a visit to obtain information for the anesthesia team and provide instructions on preparation for surgery.  What can you expect for the Preadmit Testing Visit: Appointments may be scheduled in-person or by telephone.  If a telephone visit is scheduled, you may be asked to come into the office to have lab tests or other studies performed.   This visit will not be completed any greater than 14 days prior to your surgery.  If your surgery has been scheduled for a future date, please do not be alarmed if we have not contacted you to schedule an appointment more than a month prior to the surgery date.    Please be prepared to provide the following information during this appointment:            -Personal medical history                                               -Medication and allergy list            -Any history  of problems with anesthesia              -Recent lab work or diagnostic studies            -Please notify us  of any needs we should be aware of to provide the best care possible           -You will be provided with instructions on how to prepare for your surgery.    On The Day of Surgery:  You must have a driver to take you home after surgery, you will be asked not to drive for 24 hours following surgery.  Taxi, Gisele and non-medical transport will not be acceptable means of transportation unless you have a responsible individual who will be traveling with you.  Visitors in the surgical area:   2 people will be able to visit you in your room once your preparation for surgery  has been completed. During surgery, your visitors will be asked to wait in the Surgery Waiting Area.  It is not a requirement for them to stay, if they prefer to leave and come back.  Your visitor(s) will be given an update once the surgery has been completed.  No visitors are allowed in the initial recovery room to respect patient privacy and safety.  Once you are more awake and transfer to the secondary recovery area, or are transferred to an inpatient room, visitors will again be able to see you.  To respect and protect your privacy: We will ask on the day of surgery who your driver will be and what the contact number for that individual will be. We will ask if it is okay to share information with this individual, or if there is an alternative individual that we, or the surgeon, should contact to provide updates and information. If family or friends come to the surgical information desk requesting information about you, who you have not listed with us , no information will be given.   It may be helpful to designate someone as the main contact who will be responsible for updating your other friends and family.    PREADMIT TESTING OFFICE: [phone] SAME DAY SURGERY: [phone] We look forward to caring for you before and throughout the process of your surgery.

## 2023-10-02 ENCOUNTER — Other Ambulatory Visit: Payer: Self-pay

## 2023-10-02 DIAGNOSIS — Z01818 Encounter for other preprocedural examination: Secondary | ICD-10-CM

## 2023-10-18 ENCOUNTER — Other Ambulatory Visit: Payer: Self-pay

## 2023-10-18 ENCOUNTER — Encounter
Admission: RE | Admit: 2023-10-18 | Discharge: 2023-10-18 | Disposition: A | Discharge status: Home / Self Care | Source: Ambulatory Visit | Attending: Neurosurgery | Admitting: Neurosurgery

## 2023-10-18 DIAGNOSIS — E611 Iron deficiency: Secondary | ICD-10-CM

## 2023-10-18 DIAGNOSIS — J4520 Mild intermittent asthma, uncomplicated: Secondary | ICD-10-CM

## 2023-10-18 DIAGNOSIS — R030 Elevated blood-pressure reading, without diagnosis of hypertension: Secondary | ICD-10-CM

## 2023-10-18 DIAGNOSIS — R0609 Other forms of dyspnea: Secondary | ICD-10-CM

## 2023-10-18 DIAGNOSIS — G5603 Carpal tunnel syndrome, bilateral upper limbs: Secondary | ICD-10-CM

## 2023-10-18 HISTORY — DX: Unspecified asthma, uncomplicated: J45.909

## 2023-10-18 HISTORY — DX: Carpal tunnel syndrome, right upper limb: G56.01

## 2023-10-18 NOTE — Patient Instructions (Addendum)
 Your procedure is scheduled on: 10/26/23 - Thursday Report to the Registration Desk on the 1st floor of the [facility]. To find out your arrival time, please call [phone] between 1PM - 3PM on: 10/25/23 - Wednesday If your arrival time is 6:00 am, do not arrive before that time as the [facility] entrance doors do not open until 6:00 am.  REMEMBER: Instructions that are not followed completely may result in serious medical risk, up to and including death; or upon the discretion of your surgeon and anesthesiologist your surgery may need to be rescheduled.  Do not eat food after midnight the night before surgery.  No gum chewing or hard candies.  You may however, drink CLEAR liquids up to 2 hours before you are scheduled to arrive for your surgery. Do not drink anything within 2 hours of your scheduled arrival time.  Clear liquids include: - water   - apple juice without pulp - gatorade (not RED colors) - black coffee or tea (Do NOT add milk or creamers to the coffee or tea) Do NOT drink anything that is not on this list.  One week prior to surgery: Stop Anti-inflammatories (NSAIDS) such as Advil , Aleve, Ibuprofen , Motrin , Naproxen, Naprosyn and Aspirin based products such as Excedrin, Goody's Powder, BC Powder. You may continue to take Tylenol  if needed for pain up until the day of surgery.  Stop ANY OVER THE COUNTER supplements until after surgery : VITAMIN D -3   ON THE DAY OF SURGERY ONLY TAKE THESE MEDICATIONS WITH SIPS OF WATER :  anastrozole  (ARIMIDEX ) buPROPion  (WELLBUTRIN  XL)  SYMBICORT   gabapentin  (NEURONTIN )   Use inhalers on the day of surgery and bring to the hospital.   No Alcohol for 24 hours before or after surgery.  No Smoking including e-cigarettes for 24 hours before surgery.  No chewable tobacco products for at least 6 hours before surgery.  No nicotine patches on the day of surgery.  Do not use any recreational drugs for at least a week (preferably  2 weeks) before your surgery.  Please be advised that the combination of cocaine and anesthesia may have negative outcomes, up to and including death. If you test positive for cocaine, your surgery will be cancelled.  On the morning of surgery brush your teeth with toothpaste and water , you may rinse your mouth with mouthwash if you wish. Do not swallow any toothpaste or mouthwash.  Use CHG Soap or wipes as directed on instruction sheet.  Do not wear jewelry, make-up, hairpins, clips or nail polish.  For welded (permanent) jewelry: bracelets, anklets, waist bands, etc.  Please have this removed prior to surgery.  If it is not removed, there is a chance that hospital personnel will need to cut it off on the day of surgery.  Do not wear lotions, powders, or perfumes.   Do not shave body hair from the neck down 48 hours before surgery.  Contact lenses, hearing aids and dentures may not be worn into surgery.  Do not bring valuables to the hospital. [facility] is not responsible for any missing/lost belongings or valuables.   Notify your doctor if there is any change in your medical condition (cold, fever, infection).  Wear comfortable clothing (specific to your surgery type) to the hospital.  After surgery, you can help prevent lung complications by doing breathing exercises.  Take deep breaths and cough every 1-2 hours. Your doctor may order a device called an Incentive Spirometer to help you take deep breaths.  When coughing  or sneezing, hold a pillow firmly against your incision with both hands. This is called "splinting." Doing this helps protect your incision. It also decreases belly discomfort.  If you are being admitted to the hospital overnight, leave your suitcase in the car. After surgery it may be brought to your room.  In case of increased patient census, it may be necessary for you, the patient, to continue your postoperative care in the Same Day Surgery department.  If  you are being discharged the day of surgery, you will not be allowed to drive home. You will need a responsible individual to drive you home and stay with you for 24 hours after surgery.   If you are taking public transportation, you will need to have a responsible individual with you.  Please call the Pre-admissions Testing Dept. at [phone] if you have any questions about these instructions.  Surgery Visitation Policy:  Patients having surgery or a procedure may have two visitors.  Children under the age of 65 must have an adult with them who is not the patient.  Inpatient Visitation:    Visiting hours are 7 a.m. to 8 p.m. Up to four visitors are allowed at one time in a patient room. The visitors may rotate out with other people during the day.  One visitor age 74 or older may stay with the patient overnight and must be in the room by 8 p.m.   Merchandiser, retail to address health-related social needs:  [email or web address]    Preparing for Surgery with CHLORHEXIDINE  GLUCONATE (CHG) Soap  Chlorhexidine  Gluconate (CHG) Soap  o An antiseptic cleaner that kills germs and bonds with the skin to continue killing germs even after washing  o Used for showering the night before surgery and morning of surgery  Before surgery, you can play an important role by reducing the number of germs on your skin.  CHG (Chlorhexidine  gluconate) soap is an antiseptic cleanser which kills germs and bonds with the skin to continue killing germs even after washing.  Please do not use if you have an allergy to CHG or antibacterial soaps. If your skin becomes reddened/irritated stop using the CHG.  1. Shower the NIGHT BEFORE SURGERY and the MORNING OF SURGERY with CHG soap.  2. If you choose to wash your hair, wash your hair first as usual with your normal shampoo.  3. After shampooing, rinse your hair and body thoroughly to remove the shampoo.  4. Use CHG as you would any  other liquid soap. You can apply CHG directly to the skin and wash gently with a scrungie or a clean washcloth.  5. Apply the CHG soap to your body only from the neck down. Do not use on open wounds or open sores. Avoid contact with your eyes, ears, mouth, and genitals (private parts). Wash face and genitals (private parts) with your normal soap.  6. Wash thoroughly, paying special attention to the area where your surgery will be performed.  7. Thoroughly rinse your body with warm water .  8. Do not shower/wash with your normal soap after using and rinsing off the CHG soap.  9. Pat yourself dry with a clean towel.  10. Wear clean pajamas to bed the night before surgery.  12. Place clean sheets on your bed the night of your first shower and do not sleep with pets.  13. Shower again with the CHG soap on the day of surgery prior to arriving at the hospital.  14. Do not apply  any deodorants/lotions/powders.  15. Please wear clean clothes to the hospital.

## 2023-10-19 ENCOUNTER — Encounter: Admitting: Neurology

## 2023-10-19 ENCOUNTER — Encounter
Admission: RE | Admit: 2023-10-19 | Discharge: 2023-10-19 | Disposition: A | Discharge status: Home / Self Care | Source: Ambulatory Visit | Attending: Neurosurgery | Admitting: Neurosurgery

## 2023-10-19 DIAGNOSIS — J4520 Mild intermittent asthma, uncomplicated: Secondary | ICD-10-CM | POA: Diagnosis not present

## 2023-10-19 DIAGNOSIS — R030 Elevated blood-pressure reading, without diagnosis of hypertension: Secondary | ICD-10-CM | POA: Insufficient documentation

## 2023-10-19 DIAGNOSIS — E611 Iron deficiency: Secondary | ICD-10-CM | POA: Insufficient documentation

## 2023-10-19 DIAGNOSIS — R0609 Other forms of dyspnea: Secondary | ICD-10-CM | POA: Insufficient documentation

## 2023-10-19 DIAGNOSIS — Z01810 Encounter for preprocedural cardiovascular examination: Secondary | ICD-10-CM | POA: Diagnosis not present

## 2023-10-19 DIAGNOSIS — Z01818 Encounter for other preprocedural examination: Secondary | ICD-10-CM | POA: Diagnosis not present

## 2023-10-19 LAB — BASIC METABOLIC PANEL WITH GFR
Anion gap: 10 (ref 5–15)
BUN: 16 mg/dL (ref 6–20)
CO2: 27 mmol/L (ref 22–32)
Calcium: 9.1 mg/dL (ref 8.9–10.3)
Chloride: 103 mmol/L (ref 98–111)
Creatinine, Ser: 0.57 mg/dL (ref 0.44–1.00)
GFR, Estimated: >60 mL/min (ref >60)
Glucose, Bld: 135 mg/dL — ABNORMAL HIGH (ref 70–99)
Potassium: 3.4 mmol/L — ABNORMAL LOW (ref 3.5–5.1)
Sodium: 140 mmol/L (ref 135–145)

## 2023-10-19 LAB — CBC
HCT: 41.7 % (ref 36.0–46.0)
Hemoglobin: 14.4 g/dL (ref 12.0–15.0)
MCH: 32.1 pg (ref 26.0–34.0)
MCHC: 34.5 g/dL (ref 30.0–36.0)
MCV: 93.1 fL (ref 80.0–100.0)
Platelets: 366 K/uL (ref 150–400)
RBC: 4.48 MIL/uL (ref 3.87–5.11)
RDW: 11.7 % (ref 11.5–15.5)
WBC: 6.7 K/uL (ref 4.0–10.5)
nRBC: 0 % (ref 0.0–0.2)

## 2023-10-25 MED ORDER — LACTATED RINGERS IV SOLN: INTRAVENOUS | Status: DC

## 2023-10-25 MED ORDER — ORAL CARE MOUTH RINSE: 15.0000 mL | Freq: Once | OROMUCOSAL | Status: AC

## 2023-10-25 MED ORDER — CEFAZOLIN IN SODIUM CHLORIDE 2-0.9 GM/100ML-% IV SOLN
2.0000 g | Freq: Once | INTRAVENOUS | Status: DC
  Filled 2023-10-25: qty 100

## 2023-10-25 MED ORDER — CHLORHEXIDINE GLUCONATE 0.12 % MT SOLN
15.0000 mL | Freq: Once | OROMUCOSAL | Status: AC
  Administered 2023-10-26: 15 mL via OROMUCOSAL

## 2023-10-25 MED ORDER — CEFAZOLIN SODIUM-DEXTROSE 2-4 GM/100ML-% IV SOLN
2.0000 g | INTRAVENOUS | Status: AC
  Administered 2023-10-26: 2 g via INTRAVENOUS

## 2023-10-26 ENCOUNTER — Ambulatory Visit
Admission: RE | Admit: 2023-10-26 | Discharge: 2023-10-26 | Disposition: A | Discharge status: Home / Self Care | Source: Ambulatory Visit | Attending: Neurosurgery | Admitting: Neurosurgery

## 2023-10-26 ENCOUNTER — Encounter: Payer: Self-pay | Admitting: Neurosurgery

## 2023-10-26 ENCOUNTER — Ambulatory Visit: Admitting: Anesthesiology

## 2023-10-26 ENCOUNTER — Encounter
Admission: RE | Disposition: A | Discharge status: Home / Self Care | Payer: Self-pay | Source: Ambulatory Visit | Attending: Neurosurgery

## 2023-10-26 ENCOUNTER — Ambulatory Visit: Payer: Self-pay | Admitting: Urgent Care

## 2023-10-26 ENCOUNTER — Other Ambulatory Visit: Payer: Self-pay

## 2023-10-26 DIAGNOSIS — G5603 Carpal tunnel syndrome, bilateral upper limbs: Secondary | ICD-10-CM | POA: Insufficient documentation

## 2023-10-26 DIAGNOSIS — F419 Anxiety disorder, unspecified: Secondary | ICD-10-CM | POA: Insufficient documentation

## 2023-10-26 DIAGNOSIS — J45909 Unspecified asthma, uncomplicated: Secondary | ICD-10-CM | POA: Insufficient documentation

## 2023-10-26 DIAGNOSIS — Z87891 Personal history of nicotine dependence: Secondary | ICD-10-CM | POA: Diagnosis not present

## 2023-10-26 DIAGNOSIS — Z01818 Encounter for other preprocedural examination: Secondary | ICD-10-CM

## 2023-10-26 DIAGNOSIS — G5602 Carpal tunnel syndrome, left upper limb: Secondary | ICD-10-CM | POA: Diagnosis not present

## 2023-10-26 DIAGNOSIS — F32A Depression, unspecified: Secondary | ICD-10-CM | POA: Insufficient documentation

## 2023-10-26 HISTORY — PX: CARPAL TUNNEL RELEASE: SHX101

## 2023-10-26 SURGERY — CARPAL TUNNEL RELEASE
Anesthesia: General | Laterality: Left

## 2023-10-26 MED ORDER — CEFAZOLIN SODIUM-DEXTROSE 2-4 GM/100ML-% IV SOLN
INTRAVENOUS | Status: AC
Start: 2023-10-26 — End: 2023-10-26
  Filled 2023-10-26: qty 100

## 2023-10-26 MED ORDER — MIDAZOLAM HCL 2 MG/2ML IJ SOLN
INTRAMUSCULAR | Status: AC
  Filled 2023-10-26: qty 2

## 2023-10-26 MED ORDER — MIDAZOLAM HCL 2 MG/2ML IJ SOLN
INTRAMUSCULAR | Status: DC | PRN
  Administered 2023-10-26: 2 mg via INTRAVENOUS

## 2023-10-26 MED ORDER — TRAMADOL HCL 50 MG PO TABS
50.0000 mg | ORAL_TABLET | Freq: Four times a day (QID) | ORAL | 0 refills | Status: AC | PRN
  Filled 2023-10-26: qty 20, 5d supply, fill #0

## 2023-10-26 MED ORDER — LIDOCAINE HCL (PF) 2 % IJ SOLN
INTRAMUSCULAR | Status: AC
  Filled 2023-10-26: qty 5

## 2023-10-26 MED ORDER — PROPOFOL 500 MG/50ML IV EMUL
INTRAVENOUS | Status: DC | PRN
Start: 2023-10-26 — End: 2023-10-26
  Administered 2023-10-26: 100 ug/kg/min via INTRAVENOUS
  Administered 2023-10-26: 70 mg via INTRAVENOUS

## 2023-10-26 MED ORDER — LIDOCAINE HCL 1 % IJ SOLN
INTRAMUSCULAR | Status: DC | PRN
  Administered 2023-10-26: 5 mL

## 2023-10-26 MED ORDER — LIDOCAINE HCL (CARDIAC) PF 100 MG/5ML IV SOSY
PREFILLED_SYRINGE | INTRAVENOUS | Status: DC | PRN
  Administered 2023-10-26: 50 mg via INTRAVENOUS

## 2023-10-26 MED ORDER — CHLORHEXIDINE GLUCONATE 0.12 % MT SOLN
OROMUCOSAL | Status: AC
  Filled 2023-10-26: qty 15

## 2023-10-26 MED ORDER — 0.9 % SODIUM CHLORIDE (POUR BTL) OPTIME
TOPICAL | Status: DC | PRN
  Administered 2023-10-26: 150 mL

## 2023-10-26 SURGICAL SUPPLY — 20 items
BNDG ADH 1X3 SHEER STRL LF (GAUZE/BANDAGES/DRESSINGS) ×1 IMPLANT
BNDG ADH 2 X3.75 FABRIC TAN LF (GAUZE/BANDAGES/DRESSINGS) IMPLANT
BRUSH SCRUB EZ 4% CHG (MISCELLANEOUS) ×1 IMPLANT
CHLORAPREP W/TINT 26 (MISCELLANEOUS) ×1 IMPLANT
COVER PROBE FLX POLY STRL (MISCELLANEOUS) ×1 IMPLANT
DERMABOND ADVANCED .7 DNX12 (GAUZE/BANDAGES/DRESSINGS) ×1 IMPLANT
DRAPE EXTREMITY 106X87X128.5 (DRAPES) ×1 IMPLANT
DRAPE IMP U-DRAPE 54X76 (DRAPES) ×1 IMPLANT
DRAPE SHEET LG 3/4 BI-LAMINATE (DRAPES) ×1 IMPLANT
GLOVE BIOGEL PI IND STRL 8 (GLOVE) ×1 IMPLANT
GLOVE SRG 8 PF TXTR STRL LF DI (GLOVE) ×1 IMPLANT
GLOVE SURG SYN 7.5 PF PI (GLOVE) ×1 IMPLANT
GOWN SRG XL LVL 3 NONREINFORCE (GOWNS) ×1 IMPLANT
KIT TURNOVER KIT A (KITS) ×1 IMPLANT
NDL HYPO 25X1 1.5 SAFETY (NEEDLE) ×1 IMPLANT
NEEDLE HYPO 25X1 1.5 SAFETY (NEEDLE) ×1 IMPLANT
NS IRRIG 500ML POUR BTL (IV SOLUTION) ×1 IMPLANT
PACK BASIN MINOR ARMC (MISCELLANEOUS) ×1 IMPLANT
STOCKINETTE IMPERVIOUS 9X36 MD (GAUZE/BANDAGES/DRESSINGS) ×1 IMPLANT
ULTRAGLIDE CTR (BLADE) ×1 IMPLANT

## 2023-10-26 NOTE — Anesthesia Postprocedure Evaluation (Signed)
 Anesthesia Post Note  Patient: Leah [name]  Procedure(s) Performed: CARPAL TUNNEL RELEASE (Left)  Patient location during evaluation: PACU Anesthesia Type: General Level of consciousness: awake and alert Pain management: pain level controlled Vital Signs Assessment: post-procedure vital signs reviewed and stable Respiratory status: spontaneous breathing, nonlabored ventilation and respiratory function stable Cardiovascular status: blood pressure returned to baseline and stable Postop Assessment: no apparent nausea or vomiting Anesthetic complications: no   No notable events documented.   Last Vitals:  Vitals:   10/26/23 0900 10/26/23 0923  BP: 106/67 (!) 144/79  Pulse: 76 72  Resp: 16 15  Temp: (!) 36.2 C (!) 36.3 C  SpO2: 98% 100%    Last Pain:  Vitals:   10/26/23 0923  TempSrc: Temporal  PainSc: 0-No pain                 Camellia Merilee Louder

## 2023-10-26 NOTE — Discharge Instructions (Addendum)
 Your surgeon has performed an operation on the nerves in your upper extremity. Many times, patients feel better immediately after surgery and can "overdo it." Even if you feel well, it is important that you follow these activity guidelines. If you do not let your nerve heal properly from the surgery, you can increase the chance of return of your symptoms. The following are instructions to help in your recovery once you have been discharged from the hospital.  It is normal for your nerves to feel increased "tingling" after surgery or a sensation that the nerve is "waking up".  This generally will resolve in a short period of time, within 1 to 2 weeks.  * It is ok to take NSAIDs after surgery.  Activity    Increase physical activity slowly as tolerated.  Taking short walks is encouraged, but avoid strenuous exercise. Do not jog, run, bicycle, lift weights, or participate in any other exercises unless specifically allowed by your doctor. Avoid prolonged sitting, including car rides.  For the first few days after surgery, avoid any use of the extremity more than a glass of water or for eating.  Specific instructions were given to you based off of your specific procedure.  You should not drive until no longer taking any new pain medication.  You may shower three days after your surgery.  After showering, lightly dab your incision dry. Do not take a tub bath or go swimming for 3 weeks, or until approved by your doctor at your follow-up appointment.  If you smoke, we strongly recommend that you quit.  Smoking has been proven to interfere with normal healing in your back and will dramatically reduce the success rate of your surgery. Please contact [facility] (800-QUIT-NOW) and use the resources at www.[facility].com for assistance in stopping smoking.  Surgical Incision   If you have a dressing on your incision, you may remove it three days after your surgery. Keep your incision area clean and dry.  Your  sutures are under your skin and your wound is covered with surgical glue.  The glue should begin to peel away within about a week.   Diet            You may return to your usual diet. Be sure to stay hydrated.  When to Contact Us   Although your surgery and recovery will likely be uneventful, you may have some residual numbness, aches, and pains in your back and/or legs. This is normal and should improve in the next few weeks.  However, should you experience any of the following, contact us  immediately: New numbness or weakness Pain that is progressively getting worse, and is not relieved by your pain medications or rest Bleeding, redness, swelling, pain, or drainage from surgical incision Chills or flu-like symptoms Fever greater than 101.0 F (38.3 C) Problems with bowel or bladder functions Difficulty breathing or shortness of breath Warmth, tenderness, or swelling in your calf  Contact Information How to contact us :  If you have any questions/concerns before or after surgery, you can reach us  at [phone], or you can send a mychart message. We can be reached by phone or mychart 8am-4pm, Monday-Friday.  *Please note: Calls after 4pm are forwarded to a third party answering service. Mychart messages are not routinely monitored during evenings, weekends, and holidays. Please call our office to contact the answering service for urgent concerns during non-business hours.

## 2023-10-26 NOTE — Op Note (Signed)
 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  Findings: Severe compression of the median nerve at the transverse carpal ligament  Preoperative Diagnosis:G56.02 left carpal tunnel syndrome  Postoperative Diagnosis: G56.02 left carpal tunnel syndrome   Postoperative Diagnosis: same   EBL: Minimal IVF: See anesthesia report Drains: none Disposition:Stable to PACU Complications: none  No foley catheter was placed.   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  They had tried rest, padding, and watchful waiting but had continued progressive symptoms.  Given the progression of her median neuropathy plan was made for median nerve decompression  Risk of surgery is discussed and include: Infection, bleeding, wound healing issues, pillar pain nerve injury, pain, failure to relieve the symptoms, need for further surgery.  Procedure:  1) left sided carpal tunnel decompression with ultrasound guidance   Procedure: After obtaining informed consent, the patient taken to the operating room, placed in supine position, monitored anesthesia care was induced.  They were given preoperative antibiotics.  Prepped and draped in the usual fashion.  Comprehensive timeout was performed verifying the patient's name, MRN, planned procedure.  An ultrasound was used with a sterile probe.  We used this to mark out our safety points including the interval between the ulnar artery and median nerve.  We identified the motor branch as well as the first sensory branch which were both in safe position.  We also identified the vascular arcade which was in safe positioning as well.  At this point we placed a block with Marcaine without epinephrine.  We blocked the skin where the incision would be, the superficial sensory median nerve, as well as the  transverse carpal ligament.  Under ultrasound guidance we utilized the local anesthetic to perform a hydrodissection of the nerve from the transverse carpal ligament.  We then prepped the sonexs ultra CTR knife on the back table while the anesthetic set in.  We performed a small linear incision approximately 2 to 3 mm.  We then utilized a Statistician under ultrasound guidance to identify the underside of the transverse carpal ligament.  The fat and connective tissue was dissected off the underside.  We could feel that this was quite thickened and calcified.  Causing severe compression.  Once we had a clear tract we then placed the ultrasound-guided knife into the incision and advanced it through the carpal tunnel.  We verified the safety zones including the median nerve which did not significantly cross over the knife.  We are able to see the first sensory branch which was not crossing the knife.  The artery was also in a safe place.  At this point we divided the transverse carpal ligament under ultrasound guidance, to get a full release it took 2 passes.  The nerve relaxed laterally and was well decompressed.  After the 2 passes we placed a [place] 4 into the wound and were able to feel a complete dissection of the transverse carpal ligament.  We then irrigated, we got meticulous hemostasis.  Skin glue was placed on the incision and a Band-Aid was placed on top once this was dried.  No immediate complications.  Sponge and pattie counts were correct at the end of the procedure.   I performed the procedure without an assistant surgeon  Lovenia Kim, MD/MSCR

## 2023-10-26 NOTE — H&P (Signed)
 **Chief Complaint:**   Carpal tunnel syndrome -bilateral, currently here for left side.  Hand.  **History of Present Illness:**   Leah [name] is a 58 y.o.-year-old female presenting with symptoms consistent with carpal tunnel syndrome, including numbness, tingling, and/or pain in the median nerve distribution. Symptoms have persisted for years and are worsening.    Conservative treatments attempted include:   - Night splinting   - NSAIDs   - Activity modification   - Corticosteroid injection These have provided minimal relief.   Electrodiagnostic studies confirm median nerve compression at the wrist.  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 Wadie Delaine    Family history of breast cancer    Family history of prostate cancer    Gastroesophageal reflux disease without esophagitis 04/09/2022   Herniated disc, cervical    s/p epidural injection   Herpes    History of kidney stones    Hyperlipidemia    Low back pain    Migraines    Personal history of chemotherapy    Personal history of radiation therapy    Pneumonia      **Past Surgical History:**    Past Surgical History:  Procedure Laterality Date   ANTERIOR CERVICAL DECOMP/DISCECTOMY FUSION N/A 07/29/2022   Procedure: C5-T1 ANTERIOR CERVICAL DISCECTOMY AND FUSION;  Surgeon: Clois Fret, MD;  Location: [facility] ORS;  Service: Neurosurgery;  Laterality: N/A;   APPLICATION OF INTRAOPERATIVE CT SCAN N/A 07/29/2022   Procedure: APPLICATION OF INTRAOPERATIVE CT SCAN;  Surgeon: Clois Fret, MD;  Location: [facility];  Service: Neurosurgery;  Laterality: N/A;   BREAST BIOPSY Left  12/25/2018   US  BX, invasive mammary carcinoma    BREAST LUMPECTOMY     BREAST LUMPECTOMY WITH NEEDLE LOCALIZATION AND AXILLARY SENTINEL LYMPH NODE BX Left 01/16/2019   Procedure: LEFT BREAST LUMPECTOMY WITH NEEDLE LOCALIZATION AND SENTINEL LYMPH NODE BIOPSY;  Surgeon: Curvin Deward MOULD, MD;  Location: [facility];  Service: General;  Laterality: Left;   CESAREAN SECTION     COLONOSCOPY WITH PROPOFOL  N/A 03/27/2017   Procedure: COLONOSCOPY WITH PROPOFOL ;  Surgeon: Therisa Bi, MD;  Location: [facility] ENDOSCOPY;  Service: Gastroenterology;  Laterality: N/A;   COLONOSCOPY WITH PROPOFOL  N/A 04/04/2022   Procedure: COLONOSCOPY WITH PROPOFOL ;  Surgeon: Therisa Bi, MD;  Location: [facility] ENDOSCOPY;  Service: Gastroenterology;  Laterality: N/A;   ESOPHAGOGASTRODUODENOSCOPY     ESOPHAGOGASTRODUODENOSCOPY (EGD) WITH PROPOFOL  N/A 03/27/2017   Procedure: ESOPHAGOGASTRODUODENOSCOPY (EGD) WITH PROPOFOL  WITH DILATION;  Surgeon: Therisa Bi, MD;  Location: [facility] ENDOSCOPY;  Service: Gastroenterology;  Laterality: N/A;   PORT-A-CATH REMOVAL     PORTACATH PLACEMENT N/A 02/15/2019   Procedure: INSERTION PORT-A-CATH WITH POSSIBLE ULTRASOUND;  Surgeon: Curvin Deward MOULD, MD;  Location: [facility] ORS;  Service: General;  Laterality: N/A;   POSTERIOR CERVICAL FUSION/FORAMINOTOMY N/A 07/29/2022   Procedure: C2-T2 POSTERIOR SPINAL FUSION;  Surgeon: Clois Fret, MD;  Location: [facility] OR[facility];  Service: Neurosurgery;  Laterality: N/A;   RE-EXCISION OF BREAST CANCER,SUPERIOR MARGINS Left 02/15/2019   Procedure: RE-EXCISION OF LEFT BREAST CANCER ANTERIOR MARGINS;  Surgeon: Curvin Deward  III, MD;  Location: [facility] ORS;  Service: General;  Laterality: Left;   THROAT SURGERY  2015   UMBILICAL HERNIA REPAIR  2006    x 2   w mesh per pt    **Medications:**    Current Outpatient Medications  Medication Instructions   acetaminophen  (TYLENOL ) 500-1,000 mg, Every 6 hours PRN   albuterol  (PROVENTIL ) 2.5 mg, Nebulization, Every 6 hours PRN    albuterol  (VENTOLIN  HFA) 108 (90 Base) MCG/ACT inhaler 2 puffs, Inhalation, Every 4 hours PRN   anastrozole  (ARIMIDEX ) 1 mg, Oral, Daily   budesonide -formoterol  (SYMBICORT ) 160-4.5 MCG/ACT inhaler 2 puffs, Inhalation, 2 times daily, Please schedule office visit before any future refills.   buPROPion  (WELLBUTRIN  XL) 300 mg, Oral, Daily   cetirizine  (ZYRTEC ) 10 mg, Oral, Daily PRN   Cyanocobalamin  (VITAMIN B-12 PO) 1,000 mcg, Every morning   gabapentin  (NEURONTIN ) 300 mg, Oral, Daily at bedtime   gabapentin  (NEURONTIN ) 600 mg, Oral, 3 times daily   omeprazole  (PRILOSEC) 40 mg, Oral, Every evening, 30 min before food   PARoxetine  (PAXIL ) 10 MG tablet TAKE 1 TABLET BY MOUTH EVERYDAY AT BEDTIME   Spacer/Aero-Holding Chambers (AEROCHAMBER MV) inhaler Use as instructed   traZODone  (DESYREL ) 50 mg, Oral, At bedtime PRN   Vitamin D -3 5,000 Units, Every morning     **Allergies:**    Allergies  Allergen Reactions   Hydrocodone -Acetaminophen  Hives    All over her body.   Lactose Intolerance (Gi) Other (See Comments)    Bloating and GI upset   Cephalexin  Itching   Penicillins Rash    Childhood reaction    Sulfa Antibiotics Rash    Full body rash      **Social History:**    reports that she quit smoking about 17 years ago. Her smoking use included cigarettes. She started smoking about 37 years ago. She has a 40 pack-year smoking history. She has never used smokeless tobacco. She reports current alcohol use. She reports that she does not use drugs.  **Family History:**   family history includes Breast cancer in her maternal grandmother and mother; Cancer in her maternal uncle; Colon polyps in her mother; Osteoporosis in her maternal grandmother; Prostate cancer in her father; Thyroid  disease in her mother.  **Review of Systems:**   Negative except as noted in HPI.  **Physical Exam:**   - **General:** Well-appearing, no acute distress.   - **Extremities:** Thenar muscle atrophy mild.   -  **Neurologic:**     - Positive Tinel's sign     - Positive Phalen's test     - Decreased sensation in median nerve distribution     - Weakness in thumb opposition present  **Impression:**   Carpal tunnel syndrome, bilateral here for the left side.   Indication for surgical intervention based on:   - Persistent symptoms greater than 1 year - Failure of conservative management   - Confirmatory electrodiagnostic testing  **Plan:**   Proceed with carpal tunnel release surgery using ultrasound-guided technique.   Discussed risks including but not limited to infection, nerve injury, incomplete relief, we also discussed benefits and alternatives.   Informed consent obtained.  **Preoperative Clearance:**   Patient is medically optimized for surgery.   BP (!) 141/90   Pulse 78   Temp 97.8 F (36.6 C)   Resp 17   Ht 5' 3 (1.6 m)   Wt 82.6 kg   SpO2 96%   BMI 32.24 kg/m     Recent Results (from the past 2160 hours)  CBC  Status: None   Collection Time: 10/19/23  9:51 AM  Result Value Ref Range   WBC 6.7 4.0 - 10.5 K/uL   RBC 4.48 3.87 - 5.11 MIL/uL   Hemoglobin 14.4 12.0 - 15.0 g/dL   HCT 58.2 63.9 - 53.9 %   MCV 93.1 80.0 - 100.0 fL   MCH 32.1 26.0 - 34.0 pg   MCHC 34.5 30.0 - 36.0 g/dL   RDW 88.2 88.4 - 84.4 %   Platelets 366 150 - 400 K/uL   nRBC 0.0 0.0 - 0.2 %    Comment: Performed at [facility], [address], [place], [place] [address]  Basic metabolic panel     Status: Abnormal   Collection Time: 10/19/23  9:51 AM  Result Value Ref Range   Sodium 140 135 - 145 mmol/L   Potassium 3.4 (L) 3.5 - 5.1 mmol/L   Chloride 103 98 - 111 mmol/L   CO2 27 22 - 32 mmol/L   Glucose, Bld 135 (H) 70 - 99 mg/dL    Comment: Glucose reference range applies only to samples taken after fasting for at least 8 hours.   BUN 16 6 - 20 mg/dL   Creatinine, Ser 9.42 0.44 - 1.00 mg/dL   Calcium  9.1 8.9 - 10.3 mg/dL   GFR, Estimated >39 >39 mL/min    Comment:  (NOTE) Calculated using the CKD-EPI Creatinine Equation (2021)    Anion gap 10 5 - 15    Comment: Performed at [facility], [address] [place], [place] [address]

## 2023-10-26 NOTE — Transfer of Care (Signed)
 Immediate Anesthesia Transfer of Care Note  Patient: Leah [name]  Procedure(s) Performed: CARPAL TUNNEL RELEASE (Left)  Patient Location: PACU  Anesthesia Type:General  Level of Consciousness: drowsy and patient cooperative  Airway & Oxygen Therapy: Patient connected to nasal cannula oxygen  Post-op Assessment: Report given to RN and Post -op Vital signs reviewed and stable  Post vital signs: Reviewed and stable  Last Vitals:  Vitals Value Taken Time  BP 93/62 10/26/23 08:34  Temp 36.6 C 10/26/23 08:34  Pulse 74 10/26/23 08:38  Resp 21 10/26/23 08:38  SpO2 99 % 10/26/23 08:38  Vitals shown include unfiled device data.  Last Pain:  Vitals:   10/26/23 0834  PainSc: Asleep         Complications: No notable events documented.

## 2023-10-26 NOTE — Progress Notes (Deleted)
 Established Patient Office Visit  Subjective:  Patient ID: Leah [name], female    DOB: 01-23-1966  Age: 58 y.o. MRN: [id number]  CC: No chief complaint on file.  Discussed the use of a AI scribe software for clinical note transcription with the patient, who gave verbal consent to proceed.  HPI  Leah [name] presents for annual physical exam.  Diet: Patient does/ does not eat meat. Patient consumes fruits and veggies. Patient eat some .SABRA... fried food. Patient drinks water , ... coffee Exercise:  Vaccine  Flu: Tetanus: COVID: Shingles: Colonoscopy: Pap smear/PSA:  Family history:  Colon cancer:  Breast/prostate cancer:  Dentist: Ophthalmology: HIV screening Hep C screening Tobacco use Alcohol use Illicit drugs      HPI   Past Medical History:  Diagnosis Date   Anxiety    Arthritis    Breast cancer (HCC)    Carpal tunnel syndrome of right wrist    Childhood asthma    Colon polyps    Depression    Dyspnea    Eosinophilic esophagitis    noted in [place]  with GI path report 09/29/14    Esophageal stricture    s/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 Wadie Delaine    Family history of breast cancer    Family history of prostate cancer    Gastroesophageal reflux disease without esophagitis 04/09/2022   Herniated disc, cervical    s/p epidural injection   Herpes    History of kidney stones    Hyperlipidemia    Low back pain    Migraines    Personal history of chemotherapy    Personal history of radiation therapy    Pneumonia     Past Surgical History:  Procedure Laterality Date   ANTERIOR CERVICAL DECOMP/DISCECTOMY FUSION N/A 07/29/2022   Procedure: C5-T1 ANTERIOR CERVICAL DISCECTOMY AND FUSION;  Surgeon: Clois Fret, MD;  Location: [facility];  Service: Neurosurgery;  Laterality: N/A;   APPLICATION OF INTRAOPERATIVE CT SCAN N/A 07/29/2022   Procedure: APPLICATION OF INTRAOPERATIVE CT SCAN;  Surgeon: Clois Fret, MD;  Location: [facility];  Service: Neurosurgery;  Laterality: N/A;   BREAST BIOPSY Left 12/25/2018   US  BX, invasive mammary carcinoma    BREAST LUMPECTOMY     BREAST LUMPECTOMY WITH NEEDLE LOCALIZATION AND AXILLARY SENTINEL LYMPH NODE BX Left 01/16/2019   Procedure: LEFT BREAST LUMPECTOMY WITH NEEDLE LOCALIZATION AND SENTINEL LYMPH NODE BIOPSY;  Surgeon: Curvin Deward MOULD, MD;  Location: [facility];  Service: General;  Laterality: Left;   CESAREAN SECTION     COLONOSCOPY WITH PROPOFOL  N/A 03/27/2017   Procedure: COLONOSCOPY WITH PROPOFOL ;  Surgeon: Therisa Bi, MD;  Location: [facility] ENDOSCOPY;  Service: Gastroenterology;  Laterality: N/A;   COLONOSCOPY WITH PROPOFOL  N/A 04/04/2022   Procedure: COLONOSCOPY WITH PROPOFOL ;  Surgeon: Therisa Bi, MD;  Location: [facility] ENDOSCOPY;  Service: Gastroenterology;  Laterality: N/A;   ESOPHAGOGASTRODUODENOSCOPY     ESOPHAGOGASTRODUODENOSCOPY (EGD) WITH PROPOFOL  N/A 03/27/2017   Procedure: ESOPHAGOGASTRODUODENOSCOPY (EGD) WITH PROPOFOL  WITH DILATION;  Surgeon: Therisa Bi, MD;  Location: [facility] ENDOSCOPY;  Service: Gastroenterology;  Laterality: N/A;   PORT-A-CATH REMOVAL     PORTACATH PLACEMENT N/A 02/15/2019   Procedure: INSERTION PORT-A-CATH WITH POSSIBLE ULTRASOUND;  Surgeon: Curvin Deward MOULD, MD;  Location: [facility];  Service: General;  Laterality: N/A;   POSTERIOR CERVICAL FUSION/FORAMINOTOMY N/A 07/29/2022   Procedure: C2-T2 POSTERIOR SPINAL FUSION;  Surgeon: Clois Fret, MD;  Location: [facility];  Service: Neurosurgery;  Laterality: N/A;   RE-EXCISION OF  BREAST CANCER,SUPERIOR MARGINS Left 02/15/2019   Procedure: RE-EXCISION OF LEFT BREAST CANCER ANTERIOR MARGINS;  Surgeon: Curvin Deward MOULD, MD;  Location: [facility] OR[facility];  Service: General;  Laterality: Left;   THROAT SURGERY  2015   UMBILICAL HERNIA REPAIR  2006    x 2   w mesh per pt    Family History  Problem Relation Age of Onset   Breast cancer Mother        dx late 68s   Thyroid  disease  Mother    Colon polyps Mother    Prostate cancer Father        dx 39   Breast cancer Maternal Grandmother        dx late 55s   Osteoporosis Maternal Grandmother    Cancer Maternal Uncle        possibly, unsure type    Social History   Socioeconomic History   Marital status: Single    Spouse name: Not on file   Number of children: Not on file   Years of education: Not on file   Highest education level: Some college, no degree  Occupational History   Not on file  Tobacco Use   Smoking status: Former    Current packs/day: 0.00    Average packs/day: 2.0 packs/day for 20.0 years (40.0 ttl pk-yrs)    Types: Cigarettes    Start date: 06/02/1986    Quit date: 06/02/2006    Years since quitting: 17.4   Smokeless tobacco: Never  Vaping Use   Vaping status: Never Used  Substance and Sexual Activity   Alcohol use: Yes    Comment: rarely   Drug use: No   Sexual activity: Yes    Partners: Male  Other Topics Concern   Not on file  Social History Narrative   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 As of 02/26/17    Divorced.    Former smoker 20+ years quit in 1997 then started again for 5 years and quit 10-12 years ago as of 03/08/17    Social Drivers of Health   Financial Resource Strain: Medium Risk (10/26/2023)   Overall Financial Resource Strain (CARDIA)    Difficulty of Paying Living Expenses: Somewhat hard  Food Insecurity: Food Insecurity Present (10/26/2023)   Hunger Vital Sign    Worried About Running Out of Food in the Last Year: Sometimes true    Ran Out of Food in the Last Year: Never true  Transportation Needs: No Transportation Needs (10/26/2023)   PRAPARE - Administrator, Civil Service (Medical): No    Lack of Transportation (Non-Medical): No  Physical Activity: Insufficiently Active (10/26/2023)   Exercise Vital Sign    Days of Exercise per Week: 2 days     Minutes of Exercise per Session: 20 min  Stress: Stress Concern Present (10/26/2023)   [employer] of Occupational Health - Occupational Stress Questionnaire    Feeling of Stress: Very much  Social Connections: Socially Isolated (10/26/2023)   Social Connection and Isolation Panel    Frequency of Communication with Friends and Family: Once a week    Frequency of Social Gatherings with Friends and Family: Never    Attends Religious Services: Never    Database administrator or Organizations: No    Attends Engineer, structural: Not on file    Marital Status: Divorced  Intimate Partner Violence: Not At Risk (07/29/2022)   Humiliation, Afraid, Rape, and Kick questionnaire    Fear of Current or Ex-Partner: No    Emotionally Abused: No    Physically Abused: No    Sexually Abused: No     Outpatient Medications Prior to Visit  Medication Sig Dispense Refill   acetaminophen  (TYLENOL ) 500 MG tablet Take 500-1,000 mg by mouth every 6 (six) hours as needed (for pain.).     albuterol  (PROVENTIL ) (2.5 MG/3ML) 0.083% nebulizer solution Take 3 mLs (2.5 mg total) by nebulization every 6 (six) hours as needed for wheezing or shortness of breath. 150 mL 1   albuterol  (VENTOLIN  HFA) 108 (90 Base) MCG/ACT inhaler Inhale 2 puffs into the lungs every 4 (four) hours as needed for wheezing or shortness of breath. 54 g 11   anastrozole  (ARIMIDEX ) 1 MG tablet Take 1 tablet (1 mg total) by mouth daily. 90 tablet 2   budesonide -formoterol  (SYMBICORT ) 160-4.5 MCG/ACT inhaler Inhale 2 puffs into the lungs in the morning and at bedtime. Please schedule office visit before any future refills. 10.2 g 2   buPROPion  (WELLBUTRIN  XL) 300 MG 24 hr tablet Take 1 tablet (300 mg total) by mouth daily. 90 tablet 3   cetirizine  (ZYRTEC ) 10 MG tablet Take 1 tablet (10 mg total) by mouth daily as needed for allergies. (Patient taking differently: Take 10 mg by mouth at bedtime.) 90 tablet 3   Cholecalciferol  (VITAMIN  D-3) 125 MCG (5000 UT) TABS Take 5,000 Units by mouth in the morning.     Cyanocobalamin  (VITAMIN B-12 PO) Take 1,000 mcg by mouth in the morning.     gabapentin  (NEURONTIN ) 300 MG capsule Take 1 capsule (300 mg total) by mouth at bedtime. 30 capsule 3   gabapentin  (NEURONTIN ) 600 MG tablet TAKE 1 TABLET BY MOUTH THREE TIMES A DAY (Patient taking differently: Take 600 mg by mouth in the morning and at bedtime.) 90 tablet 3   omeprazole  (PRILOSEC) 40 MG capsule TAKE 1 CAPSULE (40 MG TOTAL) BY MOUTH EVERY EVENING. 30 MIN BEFORE FOOD 90 capsule 1   PARoxetine  (PAXIL ) 10 MG tablet TAKE 1 TABLET BY MOUTH EVERYDAY AT BEDTIME 90 tablet 1   Spacer/Aero-Holding Chambers (AEROCHAMBER MV) inhaler Use as instructed 1 each 0   traMADol  (ULTRAM ) 50 MG tablet Take 1 tablet (50 mg total) by mouth every 6 (six) hours as needed for up to 5 days. 20 tablet 0   traZODone  (DESYREL ) 50 MG tablet Take 1 tablet (50 mg total) by mouth at bedtime as needed for sleep. 90 tablet 3   No facility-administered medications prior to visit.    Allergies  Allergen Reactions   Hydrocodone -Acetaminophen  Hives    All over her body.   Lactose Intolerance (Gi) Other (See Comments)    Bloating and GI upset   Cephalexin  Itching   Penicillins Rash    Childhood reaction    Sulfa Antibiotics Rash    Full body rash     ROS Review of Systems Negative unless indicated in HPI.    Objective:    Physical Exam  There were no vitals taken for this visit. Wt Readings from Last 3 Encounters:  10/26/23 182 lb (82.6 kg)  08/23/23 184 lb (83.5 kg)  05/29/23 181 lb 9.6 oz (82.4 kg)     Health Maintenance  Topic Date Due   COVID-19 Vaccine (1) Never done   Hepatitis B Vaccines 19-59 Average Risk (1 of 3 - 19+ 3-dose series) Never done  INFLUENZA VACCINE  10/13/2023   MAMMOGRAM  11/27/2024   Cervical Cancer Screening (HPV/Pap Cotest)  08/21/2026   Colonoscopy  04/05/2027   DEXA SCAN  11/15/2027   DTaP/Tdap/Td (2 - Td or  Tdap) 03/13/2028   Pneumococcal Vaccine: 50+ Years  Completed   Hepatitis C Screening  Completed   HIV Screening  Completed   Zoster Vaccines- Shingrix   Completed   HPV VACCINES  Aged Out   Meningococcal B Vaccine  Aged Out   Pneumococcal Vaccine  Discontinued       Topic Date Due   Hepatitis B Vaccines 19-59 Average Risk (1 of 3 - 19+ 3-dose series) Never done    Lab Results  Component Value Date   TSH 1.430 11/28/2022   Lab Results  Component Value Date   WBC 6.7 10/19/2023   HGB 14.4 10/19/2023   HCT 41.7 10/19/2023   MCV 93.1 10/19/2023   PLT 366 10/19/2023   Lab Results  Component Value Date   NA 140 10/19/2023   K 3.4 (L) 10/19/2023   CO2 27 10/19/2023   GLUCOSE 135 (H) 10/19/2023   BUN 16 10/19/2023   CREATININE 0.57 10/19/2023   BILITOT 0.6 11/28/2022   ALKPHOS 65 11/28/2022   AST 19 11/28/2022   ALT 22 11/28/2022   PROT 7.0 11/28/2022   ALBUMIN 3.7 11/28/2022   CALCIUM  9.1 10/19/2023   ANIONGAP 10 10/19/2023   GFR 92.92 10/04/2018   Lab Results  Component Value Date   CHOL 251 (H) 06/13/2022   Lab Results  Component Value Date   HDL 67.70 06/13/2022   Lab Results  Component Value Date   LDLCALC 159 (H) 08/20/2021   Lab Results  Component Value Date   TRIG 208.0 (H) 06/13/2022   Lab Results  Component Value Date   CHOLHDL 4 06/13/2022   Lab Results  Component Value Date   HGBA1C 5.9 06/13/2022      Assessment & Plan:  There are no diagnoses linked to this encounter.  Follow-up: No follow-ups on file.   Isabelly Kobler, NP

## 2023-10-26 NOTE — Anesthesia Preprocedure Evaluation (Addendum)
 Anesthesia Evaluation  Patient identified by MRN, date of birth, ID band Patient awake    Reviewed: Allergy & Precautions, H&P , NPO status , Patient's Chart, lab work & pertinent test results  Airway Mallampati: III  TM Distance: >3 FB Neck ROM: full    Dental  (+) Missing   Pulmonary asthma , former smoker   Pulmonary exam normal        Cardiovascular Exercise Tolerance: Good negative cardio ROS Normal cardiovascular exam     Neuro/Psych  PSYCHIATRIC DISORDERS Anxiety Depression    Previous history of C2-T2 posterior spinal fusion and C5-T1 ACD  Neuromuscular disease    GI/Hepatic Neg liver ROS,,,  Endo/Other  negative endocrine ROS    Renal/GU negative Renal ROS  negative genitourinary   Musculoskeletal   Abdominal  (+) + obese  Peds  Hematology negative hematology ROS (+)   Anesthesia Other Findings Past Medical History: No date: Anxiety No date: Arthritis No date: Breast cancer (HCC) No date: Carpal tunnel syndrome of right wrist No date: Childhood asthma No date: Colon polyps No date: Depression No date: Dyspnea No date: Eosinophilic esophagitis     Comment:  noted in [place]  with GI path report 09/29/14  No date: Esophageal stricture     Comment:  s/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               Wadie Delaine  No date: Family history of breast cancer No date: Family history of prostate cancer 04/09/2022: Gastroesophageal reflux disease without esophagitis No date: Herniated disc, cervical     Comment:  s/p epidural injection No date: Herpes No date: History of kidney stones No date: Hyperlipidemia No date: Low back pain No date: Migraines No date: Personal history of chemotherapy No date: Personal history of radiation therapy No date: Pneumonia  Past Surgical History: 07/29/2022: ANTERIOR CERVICAL DECOMP/DISCECTOMY FUSION; N/A     Comment:  Procedure: C5-T1 ANTERIOR  CERVICAL DISCECTOMY AND               FUSION;  Surgeon: Clois Fret, MD;  Location: [facility]              ORS;  Service: Neurosurgery;  Laterality: N/A; 07/29/2022: APPLICATION OF INTRAOPERATIVE CT SCAN; N/A     Comment:  Procedure: APPLICATION OF INTRAOPERATIVE CT SCAN;                Surgeon: Clois Fret, MD;  Location: [facility] ORS;                Service: Neurosurgery;  Laterality: N/A; 12/25/2018: BREAST BIOPSY; Left     Comment:  US  BX, invasive mammary carcinoma  No date: BREAST LUMPECTOMY 01/16/2019: BREAST LUMPECTOMY WITH NEEDLE LOCALIZATION AND AXILLARY  SENTINEL LYMPH NODE BX; Left     Comment:  Procedure: LEFT BREAST LUMPECTOMY WITH NEEDLE               LOCALIZATION AND SENTINEL LYMPH NODE BIOPSY;  Surgeon:               Curvin Deward MOULD, MD;  Location: [facility] ORS;  Service:               General;  Laterality: Left; No date: CESAREAN SECTION 03/27/2017: COLONOSCOPY WITH PROPOFOL ; N/A     Comment:  Procedure: COLONOSCOPY WITH PROPOFOL ;  Surgeon: Therisa Bi, MD;  Location: [facility] ENDOSCOPY;  Service:  Gastroenterology;  Laterality: N/A; 04/04/2022: COLONOSCOPY WITH PROPOFOL ; N/A     Comment:  Procedure: COLONOSCOPY WITH PROPOFOL ;  Surgeon: Therisa Bi, MD;  Location: [facility] ENDOSCOPY;  Service:               Gastroenterology;  Laterality: N/A; No date: ESOPHAGOGASTRODUODENOSCOPY 03/27/2017: ESOPHAGOGASTRODUODENOSCOPY (EGD) WITH PROPOFOL ; N/A     Comment:  Procedure: ESOPHAGOGASTRODUODENOSCOPY (EGD) WITH               PROPOFOL  WITH DILATION;  Surgeon: Therisa Bi, MD;                Location: [facility] ENDOSCOPY;  Service: Gastroenterology;                Laterality: N/A; No date: PORT-A-CATH REMOVAL 02/15/2019: PORTACATH PLACEMENT; N/A     Comment:  Procedure: INSERTION PORT-A-CATH WITH POSSIBLE               ULTRASOUND;  Surgeon: Curvin Deward MOULD, MD;  Location: [facility]              ORS;  Service: General;  Laterality: N/A; 07/29/2022:  POSTERIOR CERVICAL FUSION/FORAMINOTOMY; N/A     Comment:  Procedure: C2-T2 POSTERIOR SPINAL FUSION;  Surgeon:               Clois Fret, MD;  Location: [facility] ORS;  Service:               Neurosurgery;  Laterality: N/A; 02/15/2019: RE-EXCISION OF BREAST CANCER,SUPERIOR MARGINS; Left     Comment:  Procedure: RE-EXCISION OF LEFT BREAST CANCER ANTERIOR               MARGINS;  Surgeon: Curvin Deward MOULD, MD;  Location: [facility]               ORS;  Service: General;  Laterality: Left; 2015: THROAT SURGERY 2006: UMBILICAL HERNIA REPAIR     Comment:   x 2   w mesh per pt  BMI    Body Mass Index: 32.24 kg/m      Reproductive/Obstetrics negative OB ROS                              Anesthesia Physical Anesthesia Plan  ASA: 2  Anesthesia Plan: General   Post-op Pain Management: Regional block*   Induction: Intravenous  PONV Risk Score and Plan: Propofol  infusion and TIVA  Airway Management Planned: Natural Airway  Additional Equipment:   Intra-op Plan:   Post-operative Plan:   Informed Consent: I have reviewed the patients History and Physical, chart, labs and discussed the procedure including the risks, benefits and alternatives for the proposed anesthesia with the patient or authorized representative who has indicated his/her understanding and acceptance.     Dental Advisory Given  Plan Discussed with: CRNA and Surgeon  Anesthesia Plan Comments:          Anesthesia Quick Evaluation

## 2023-10-27 ENCOUNTER — Encounter: Admitting: Nurse Practitioner

## 2023-10-27 ENCOUNTER — Other Ambulatory Visit: Payer: Self-pay

## 2023-10-27 ENCOUNTER — Telehealth: Payer: Self-pay | Admitting: General Practice

## 2023-10-27 ENCOUNTER — Encounter: Payer: Self-pay | Admitting: Neurosurgery

## 2023-10-27 ENCOUNTER — Encounter: Payer: Medicaid Other | Admitting: Family Medicine

## 2023-10-27 DIAGNOSIS — F39 Unspecified mood [affective] disorder: Secondary | ICD-10-CM

## 2023-10-27 DIAGNOSIS — A6000 Herpesviral infection of urogenital system, unspecified: Secondary | ICD-10-CM

## 2023-10-27 MED ORDER — BUPROPION HCL ER (XL) 300 MG PO TB24: 300.0000 mg | ORAL_TABLET | Freq: Every day | ORAL | 0 refills | Status: DC

## 2023-10-27 NOTE — Telephone Encounter (Signed)
 Prescription Request  10/27/2023  LOV: Visit date not found  What is the name of the medication or equipment? buPROPion  (WELLBUTRIN  XL) 300 MG 24 hr tablet   Have you contacted your pharmacy to request a refill? No   Which pharmacy would you like this sent to?  [id number] [address], [place] - [address] [place] [address] Phone: [phone] Fax: [phone]    Patient notified that their request is being sent to the clinical staff for review and that they should receive a response within 2 business days.   Please advise at Mobile [phone] (mobile)

## 2023-11-01 ENCOUNTER — Other Ambulatory Visit: Payer: Self-pay

## 2023-11-02 MED ORDER — VALACYCLOVIR HCL 1 G PO TABS: 1000.0000 mg | ORAL_TABLET | Freq: Two times a day (BID) | ORAL | 3 refills | Status: AC

## 2023-11-03 NOTE — Telephone Encounter (Signed)
 Noted

## 2023-11-08 ENCOUNTER — Ambulatory Visit (INDEPENDENT_AMBULATORY_CARE_PROVIDER_SITE_OTHER): Admitting: Physician Assistant

## 2023-11-08 ENCOUNTER — Encounter: Payer: Self-pay | Admitting: Physician Assistant

## 2023-11-08 ENCOUNTER — Other Ambulatory Visit: Payer: Self-pay

## 2023-11-08 VITALS — BP 142/80 | Temp 99.0°F | Ht 63.0 in | Wt 182.0 lb

## 2023-11-08 DIAGNOSIS — G5602 Carpal tunnel syndrome, left upper limb: Secondary | ICD-10-CM

## 2023-11-08 DIAGNOSIS — J0101 Acute recurrent maxillary sinusitis: Secondary | ICD-10-CM

## 2023-11-08 DIAGNOSIS — Z09 Encounter for follow-up examination after completed treatment for conditions other than malignant neoplasm: Secondary | ICD-10-CM

## 2023-11-08 MED ORDER — CETIRIZINE HCL 10 MG PO TABS: 10.0000 mg | ORAL_TABLET | Freq: Every day | ORAL | 0 refills | Status: DC | PRN

## 2023-11-08 MED ORDER — TRAMADOL HCL 50 MG PO TABS: 50.0000 mg | ORAL_TABLET | Freq: Four times a day (QID) | ORAL | 0 refills | Status: DC | PRN

## 2023-11-08 NOTE — Progress Notes (Signed)
   REFERRING PHYSICIAN:  No referring provider defined for this encounter.  DOS: 10/26/23, L [facility]  HISTORY OF PRESENT ILLNESS: Leah [name] is 2 weeks status post ultrasound-guided carpal tunnel release on the left side very well.  She is significantly improved.  Her numbness and tingling is gone.  She continues to have right handed symptoms with known moderate to severe carpal tunnel syndrome on the contralateral side.    PHYSICAL EXAMINATION:  NEUROLOGICAL:  General: In no acute distress.   Awake, alert, oriented to person, place, and time.  Pupils equal round and reactive to light.  Facial tone is symmetric.    Strength: Strength is to baseline. Incision c/d/I  Imaging:  No new imaging  Assessment / Plan: Leah [name] is doing well after left ultrasound-guided carpal tunnel release 2 weeks ago.  Her symptoms have improved significantly.  She is looking forward to having her right sided carpal tunnel release likely in a few months.  She will notify us  when she would like this to take place.    Advised to contact the office if any questions or concerns arise.   Lyle Decamp PA-C Dept of Neurosurgery

## 2023-11-24 ENCOUNTER — Other Ambulatory Visit: Payer: Self-pay | Admitting: Medical Genetics

## 2023-11-27 ENCOUNTER — Other Ambulatory Visit
Admission: RE | Admit: 2023-11-27 | Discharge: 2023-11-27 | Disposition: A | Discharge status: Home / Self Care | Payer: Self-pay | Source: Ambulatory Visit | Attending: Medical Genetics | Admitting: Medical Genetics

## 2023-11-29 ENCOUNTER — Inpatient Hospital Stay: Admitting: Oncology

## 2023-11-30 ENCOUNTER — Ambulatory Visit
Admission: RE | Admit: 2023-11-30 | Discharge: 2023-11-30 | Disposition: A | Discharge status: Home / Self Care | Source: Ambulatory Visit | Attending: Oncology | Admitting: Oncology

## 2023-11-30 DIAGNOSIS — Z853 Personal history of malignant neoplasm of breast: Secondary | ICD-10-CM | POA: Insufficient documentation

## 2023-11-30 DIAGNOSIS — Z08 Encounter for follow-up examination after completed treatment for malignant neoplasm: Secondary | ICD-10-CM | POA: Diagnosis not present

## 2023-12-01 ENCOUNTER — Other Ambulatory Visit: Payer: Self-pay | Admitting: Internal Medicine

## 2023-12-01 DIAGNOSIS — J309 Allergic rhinitis, unspecified: Secondary | ICD-10-CM

## 2023-12-04 ENCOUNTER — Inpatient Hospital Stay: Attending: Oncology | Admitting: Oncology

## 2023-12-04 ENCOUNTER — Encounter: Payer: Self-pay | Admitting: Oncology

## 2023-12-04 VITALS — BP 114/70 | HR 88 | Temp 96.1°F | Resp 19 | Ht 63.0 in | Wt 183.3 lb

## 2023-12-04 DIAGNOSIS — Z803 Family history of malignant neoplasm of breast: Secondary | ICD-10-CM | POA: Insufficient documentation

## 2023-12-04 DIAGNOSIS — T451X5D Adverse effect of antineoplastic and immunosuppressive drugs, subsequent encounter: Secondary | ICD-10-CM | POA: Insufficient documentation

## 2023-12-04 DIAGNOSIS — Z08 Encounter for follow-up examination after completed treatment for malignant neoplasm: Secondary | ICD-10-CM | POA: Diagnosis not present

## 2023-12-04 DIAGNOSIS — Z923 Personal history of irradiation: Secondary | ICD-10-CM | POA: Insufficient documentation

## 2023-12-04 DIAGNOSIS — Z87891 Personal history of nicotine dependence: Secondary | ICD-10-CM | POA: Insufficient documentation

## 2023-12-04 DIAGNOSIS — Z79899 Other long term (current) drug therapy: Secondary | ICD-10-CM | POA: Diagnosis not present

## 2023-12-04 DIAGNOSIS — Z9221 Personal history of antineoplastic chemotherapy: Secondary | ICD-10-CM | POA: Insufficient documentation

## 2023-12-04 DIAGNOSIS — Z809 Family history of malignant neoplasm, unspecified: Secondary | ICD-10-CM | POA: Insufficient documentation

## 2023-12-04 DIAGNOSIS — G620 Drug-induced polyneuropathy: Secondary | ICD-10-CM | POA: Insufficient documentation

## 2023-12-04 DIAGNOSIS — C50212 Malignant neoplasm of upper-inner quadrant of left female breast: Secondary | ICD-10-CM | POA: Diagnosis not present

## 2023-12-04 DIAGNOSIS — Z5181 Encounter for therapeutic drug level monitoring: Secondary | ICD-10-CM

## 2023-12-04 DIAGNOSIS — M8580 Other specified disorders of bone density and structure, unspecified site: Secondary | ICD-10-CM | POA: Insufficient documentation

## 2023-12-04 DIAGNOSIS — Z79811 Long term (current) use of aromatase inhibitors: Secondary | ICD-10-CM | POA: Diagnosis not present

## 2023-12-04 DIAGNOSIS — Z170 Estrogen receptor positive status [ER+]: Secondary | ICD-10-CM | POA: Diagnosis not present

## 2023-12-04 NOTE — Progress Notes (Signed)
 Patient states she's happy her MAMMO checked out good. She has no new or acute concerns at this time.

## 2023-12-04 NOTE — Progress Notes (Signed)
 Hematology/Oncology Consult note [facility]  [phone] [phone]  Patient Care Team: Patient, No Pcp Per as PCP - General (General Practice) Curvin Deward MOULD, MD as Consulting Physician (General Surgery) Lenn Aran, MD as Referring Physician (Radiation Oncology) Cindie Jesusa HERO, RN as Registered Nurse Melanee Annah BROCKS, MD as Consulting Physician (Oncology)   Name of the patient: Leah [name]  [id number]  07-Jan-1966   Date of visit: 12/04/23  Diagnosis- pathological prognostic stage Ia invasive mammary carcinoma pT1b pN1 cM0 ER/PR positive HER-2/neu negative s/p lumpectomy, adjuvant chemotherapy and radiation currently on Arimidex    Chief complaint/ Reason for visit-routine follow-up visit for breast cancer  Heme/Onc history:  Patient is a 58 year old postmenopausal female G2 P2 L2 who  underwent screening bilateral mammogram which showed area of concern in her left breast.  This was followed by an ultrasound and Core biopsy.  Ultrasound showed 1 x 1.1 x 0.7 cm hypoechoic mass at the 10:30 position of the left breast.  Left axilla appeared normal.  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    Final pathology showed 9 mm grade 1 invasive mammary carcinoma 1 out of 2 lymph nodes involved with macro metastatic carcinoma 3.5 mm.  Anterior margin was focally positive for which patient underwent reexcision surgery with negative margins [id number] (sn)   MammaPrint score came back as high risk with a risk of recurrence of 29% at 10 years without chemotherapy   Patient completed 4 cycles of dose dense AC chemotherapy and 12 cycles of weekly Taxol .  She was started on letrozole  in August 2021 but could not tolerate it due to diarrhea and is currently on Arimidex     Interval history-patient is presently on 600 mg of gabapentin  in the morning and 900 mg at night.  This dose is  keeping her neuropathy symptoms under good control.  She is tolerating Arimidex  well without any significant side effects  ECOG PS- 0 Pain scale- 0   Review of systems- Review of Systems  Constitutional:  Negative for chills, fever, malaise/fatigue and weight loss.  HENT:  Negative for congestion, ear discharge and nosebleeds.   Eyes:  Negative for blurred vision.  Respiratory:  Negative for cough, hemoptysis, sputum production, shortness of breath and wheezing.   Cardiovascular:  Negative for chest pain, palpitations, orthopnea and claudication.  Gastrointestinal:  Negative for abdominal pain, blood in stool, constipation, diarrhea, heartburn, melena, nausea and vomiting.  Genitourinary:  Negative for dysuria, flank pain, frequency, hematuria and urgency.  Musculoskeletal:  Negative for back pain, joint pain and myalgias.  Skin:  Negative for rash.  Neurological:  Positive for sensory change (Chemo-induced peripheral neuropathy). Negative for dizziness, tingling, focal weakness, seizures, weakness and headaches.  Endo/Heme/Allergies:  Does not bruise/bleed easily.  Psychiatric/Behavioral:  Negative for depression and suicidal ideas. The patient does not have insomnia.       Allergies  Allergen Reactions   Hydrocodone -Acetaminophen  Hives    All over her body.   Lactose Intolerance (Gi) Other (See Comments)    Bloating and GI upset   Cephalexin  Itching   Penicillins Rash    Childhood reaction    Sulfa Antibiotics Rash    Full body rash      Past Medical History:  Diagnosis Date   Anxiety    Arthritis    Breast cancer (HCC)    Carpal tunnel syndrome of right wrist    Childhood asthma  Colon polyps    Depression    Dyspnea    Eosinophilic esophagitis    noted in [place]  with GI path report 09/29/14    Esophageal stricture    s/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 Wadie Delaine    Family history of breast cancer    Family history of prostate cancer     Gastroesophageal reflux disease without esophagitis 04/09/2022   Herniated disc, cervical    s/p epidural injection   Herpes    History of kidney stones    Hyperlipidemia    Low back pain    Migraines    Personal history of chemotherapy    Personal history of radiation therapy    Pneumonia      Past Surgical History:  Procedure Laterality Date   ANTERIOR CERVICAL DECOMP/DISCECTOMY FUSION N/A 07/29/2022   Procedure: C5-T1 ANTERIOR CERVICAL DISCECTOMY AND FUSION;  Surgeon: Clois Fret, MD;  Location: [facility] ORS;  Service: Neurosurgery;  Laterality: N/A;   APPLICATION OF INTRAOPERATIVE CT SCAN N/A 07/29/2022   Procedure: APPLICATION OF INTRAOPERATIVE CT SCAN;  Surgeon: Clois Fret, MD;  Location: [facility];  Service: Neurosurgery;  Laterality: N/A;   BREAST BIOPSY Left 12/25/2018   US  BX, invasive mammary carcinoma    BREAST LUMPECTOMY     BREAST LUMPECTOMY WITH NEEDLE LOCALIZATION AND AXILLARY SENTINEL LYMPH NODE BX Left 01/16/2019   Procedure: LEFT BREAST LUMPECTOMY WITH NEEDLE LOCALIZATION AND SENTINEL LYMPH NODE BIOPSY;  Surgeon: Curvin Deward MOULD, MD;  Location: [facility];  Service: General;  Laterality: Left;   CARPAL TUNNEL RELEASE Left 10/26/2023   Procedure: CARPAL TUNNEL RELEASE;  Surgeon: Claudene Penne ORN, MD;  Location: [facility];  Service: Neurosurgery;  Laterality: Left;  LEFT CARPAL TUNNEL RELEASE WITH ULTRASOUND GUIDANCE, POSSIBLE CONVERT TO OPEN   CESAREAN SECTION     COLONOSCOPY WITH PROPOFOL  N/A 03/27/2017   Procedure: COLONOSCOPY WITH PROPOFOL ;  Surgeon: Therisa Bi, MD;  Location: [facility];  Service: Gastroenterology;  Laterality: N/A;   COLONOSCOPY WITH PROPOFOL  N/A 04/04/2022   Procedure: COLONOSCOPY WITH PROPOFOL ;  Surgeon: Therisa Bi, MD;  Location: [facility] ENDOSCOPY;  Service: Gastroenterology;  Laterality: N/A;   ESOPHAGOGASTRODUODENOSCOPY     ESOPHAGOGASTRODUODENOSCOPY (EGD) WITH PROPOFOL  N/A 03/27/2017   Procedure: ESOPHAGOGASTRODUODENOSCOPY (EGD)  WITH PROPOFOL  WITH DILATION;  Surgeon: Therisa Bi, MD;  Location: [facility] ENDOSCOPY;  Service: Gastroenterology;  Laterality: N/A;   PORT-A-CATH REMOVAL     PORTACATH PLACEMENT N/A 02/15/2019   Procedure: INSERTION PORT-A-CATH WITH POSSIBLE ULTRASOUND;  Surgeon: Curvin Deward MOULD, MD;  Location: [facility];  Service: General;  Laterality: N/A;   POSTERIOR CERVICAL FUSION/FORAMINOTOMY N/A 07/29/2022   Procedure: C2-T2 POSTERIOR SPINAL FUSION;  Surgeon: Clois Fret, MD;  Location: [facility];  Service: Neurosurgery;  Laterality: N/A;   RE-EXCISION OF BREAST CANCER,SUPERIOR MARGINS Left 02/15/2019   Procedure: RE-EXCISION OF LEFT BREAST CANCER ANTERIOR MARGINS;  Surgeon: Curvin Deward MOULD, MD;  Location: [facility] OR[facility];  Service: General;  Laterality: Left;   THROAT SURGERY  2015   UMBILICAL HERNIA REPAIR  2006    x 2   w mesh per pt    Social History   Socioeconomic History   Marital status: Single    Spouse name: Not on file   Number of children: Not on file   Years of education: Not on file   Highest education level: Some college, no degree  Occupational History   Not on file  Tobacco Use   Smoking status: Former    Current packs/day: 0.00  Average packs/day: 2.0 packs/day for 20.0 years (40.0 ttl pk-yrs)    Types: Cigarettes    Start date: 06/02/1986    Quit date: 06/02/2006    Years since quitting: 17.5   Smokeless tobacco: Never  Vaping Use   Vaping status: Never Used  Substance and Sexual Activity   Alcohol use: Yes    Comment: rarely   Drug use: No   Sexual activity: Yes    Partners: Male  Other Topics Concern   Not on file  Social History Narrative   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 As of 02/26/17    Divorced.    Former smoker 20+ years quit in 1997 then started again for 5 years and quit 10-12 years ago as of 03/08/17    Social Drivers of Health   Financial Resource Strain:  Medium Risk (10/26/2023)   Overall Financial Resource Strain (CARDIA)    Difficulty of Paying Living Expenses: Somewhat hard  Food Insecurity: Food Insecurity Present (10/26/2023)   Hunger Vital Sign    Worried About Running Out of Food in the Last Year: Sometimes true    Ran Out of Food in the Last Year: Never true  Transportation Needs: No Transportation Needs (10/26/2023)   PRAPARE - Administrator, Civil Service (Medical): No    Lack of Transportation (Non-Medical): No  Physical Activity: Insufficiently Active (10/26/2023)   Exercise Vital Sign    Days of Exercise per Week: 2 days    Minutes of Exercise per Session: 20 min  Stress: Stress Concern Present (10/26/2023)   [employer] of Occupational Health - Occupational Stress Questionnaire    Feeling of Stress: Very much  Social Connections: Socially Isolated (10/26/2023)   Social Connection and Isolation Panel    Frequency of Communication with Friends and Family: Once a week    Frequency of Social Gatherings with Friends and Family: Never    Attends Religious Services: Never    Database administrator or Organizations: No    Attends Engineer, structural: Not on file    Marital Status: Divorced  Intimate Partner Violence: Not At Risk (07/29/2022)   Humiliation, Afraid, Rape, and Kick questionnaire    Fear of Current or Ex-Partner: No    Emotionally Abused: No    Physically Abused: No    Sexually Abused: No    Family History  Problem Relation Age of Onset   Breast cancer Mother        dx late 81s   Thyroid  disease Mother    Colon polyps Mother    Prostate cancer Father        dx 79   Breast cancer Maternal Grandmother        dx late 66s   Osteoporosis Maternal Grandmother    Cancer Maternal Uncle        possibly, unsure type     Current Outpatient Medications:    acetaminophen  (TYLENOL ) 500 MG tablet, Take 500-1,000 mg by mouth every 6 (six) hours as needed (for pain.)., Disp: , Rfl:     albuterol  (PROVENTIL ) (2.5 MG/3ML) 0.083% nebulizer solution, Take 3 mLs (2.5 mg total) by nebulization every 6 (six) hours as needed for wheezing or shortness of breath., Disp: 150 mL, Rfl: 1   albuterol  (VENTOLIN  HFA) 108 (90 Base) MCG/ACT inhaler, Inhale 2 puffs into the lungs every 4 (four)  hours as needed for wheezing or shortness of breath., Disp: 54 g, Rfl: 11   anastrozole  (ARIMIDEX ) 1 MG tablet, Take 1 tablet (1 mg total) by mouth daily., Disp: 90 tablet, Rfl: 2   budesonide -formoterol  (SYMBICORT ) 160-4.5 MCG/ACT inhaler, Inhale 2 puffs into the lungs in the morning and at bedtime. Please schedule office visit before any future refills., Disp: 10.2 g, Rfl: 2   buPROPion  (WELLBUTRIN  XL) 300 MG 24 hr tablet, Take 1 tablet (300 mg total) by mouth daily., Disp: 90 tablet, Rfl: 0   cetirizine  (ZYRTEC ) 10 MG tablet, TAKE 1 TABLET BY MOUTH EVERY DAY AS NEEDED FOR ALLERGY, Disp: 90 tablet, Rfl: 1   Cholecalciferol  (VITAMIN D -3) 125 MCG (5000 UT) TABS, Take 5,000 Units by mouth in the morning., Disp: , Rfl:    Cyanocobalamin  (VITAMIN B-12 PO), Take 1,000 mcg by mouth in the morning., Disp: , Rfl:    gabapentin  (NEURONTIN ) 300 MG capsule, Take 1 capsule (300 mg total) by mouth at bedtime., Disp: 30 capsule, Rfl: 3   gabapentin  (NEURONTIN ) 600 MG tablet, TAKE 1 TABLET BY MOUTH THREE TIMES A DAY (Patient taking differently: Take 600 mg by mouth in the morning and at bedtime.), Disp: 90 tablet, Rfl: 3   omeprazole  (PRILOSEC) 40 MG capsule, TAKE 1 CAPSULE (40 MG TOTAL) BY MOUTH EVERY EVENING. 30 MIN BEFORE FOOD, Disp: 90 capsule, Rfl: 1   PARoxetine  (PAXIL ) 10 MG tablet, TAKE 1 TABLET BY MOUTH EVERYDAY AT BEDTIME, Disp: 90 tablet, Rfl: 1   Spacer/Aero-Holding Chambers (AEROCHAMBER MV) inhaler, Use as instructed, Disp: 1 each, Rfl: 0   traMADol  (ULTRAM ) 50 MG tablet, Take 1 tablet (50 mg total) by mouth every 6 (six) hours as needed., Disp: 20 tablet, Rfl: 0   traZODone  (DESYREL ) 50 MG tablet, Take 1 tablet  (50 mg total) by mouth at bedtime as needed for sleep., Disp: 90 tablet, Rfl: 3   valACYclovir  (VALTREX ) 1000 MG tablet, Take 1 tablet (1,000 mg total) by mouth 2 (two) times daily. For outbreak, then continue one tablet daily for suppression, Disp: 104 tablet, Rfl: 3  Physical exam: There were no vitals filed for this visit. Physical Exam Cardiovascular:     Rate and Rhythm: Normal rate and regular rhythm.     Heart sounds: Normal heart sounds.  Pulmonary:     Effort: Pulmonary effort is normal.     Breath sounds: Normal breath sounds.  Skin:    General: Skin is warm and dry.  Neurological:     Mental Status: She is alert and oriented to person, place, and time.    Breast exam was performed in seated and lying down position. Patient is status post left lumpectomy with a well-healed surgical scar. No evidence of any palpable masses. No evidence of axillary adenopathy. No evidence of any palpable masses or lumps in the right breast. No evidence of right axillary adenopathy   I have personally reviewed labs listed below:    Latest Ref Rng & Units 10/19/2023    9:51 AM  CMP  Glucose 70 - 99 mg/dL 864   BUN 6 - 20 mg/dL 16   Creatinine 9.55 - 1.00 mg/dL 9.42   Sodium 864 - 854 mmol/L 140   Potassium 3.5 - 5.1 mmol/L 3.4   Chloride 98 - 111 mmol/L 103   CO2 22 - 32 mmol/L 27   Calcium  8.9 - 10.3 mg/dL 9.1       Latest Ref Rng & Units 10/19/2023    9:51 AM  CBC  WBC 4.0 - 10.5 K/uL 6.7   Hemoglobin 12.0 - 15.0 g/dL 85.5   Hematocrit 63.9 - 46.0 % 41.7   Platelets 150 - 400 K/uL 366    I have personally reviewed Radiology images listed below: No images are attached to the encounter.  MM DIAG BREAST TOMO BILATERAL Result Date: 11/30/2023 CLINICAL DATA:  Patient presents for bilateral diagnostic examination due to history of previous left malignant lumpectomy 2020. EXAM: DIGITAL DIAGNOSTIC BILATERAL MAMMOGRAM WITH TOMOSYNTHESIS AND CAD TECHNIQUE: Bilateral digital diagnostic  mammography and breast tomosynthesis was performed. The images were evaluated with computer-aided detection. COMPARISON:  Previous exam(s). ACR Breast Density Category c: The breasts are heterogeneously dense, which may obscure small masses. FINDINGS: Exam demonstrates stable post lumpectomy changes over the upper central left breast. Remainder of the left breast is unchanged. Right breast is unchanged. IMPRESSION: Stable post lumpectomy changes of the left breast. Stable right breast. RECOMMENDATION: Recommend continued annual bilateral screening mammographic follow-up. I have discussed the findings and recommendations with the patient. If applicable, a reminder letter will be sent to the patient regarding the next appointment. BI-RADS CATEGORY  2: Benign. Electronically Signed   By: Toribio Agreste M.D.   On: 11/30/2023 10:05     Assessment and plan- Patient is a 58 y.o. female with history of stage I invasive mammary carcinoma of the left breast pT1b N1 M0 ER/PR positive HER2 negative status postlumpectomy and adjuvant chemotherapy and radiation.  She is presently on Arimidex  and this is a routine follow-up visit  Clinically patient is doing well with no concerning signs and symptoms of recurrence based on today's exam.  Her mammogram from September 2025 was unremarkable.  Given that she had high risk breast cancer requiring adjuvant chemotherapy she will continue to stay on Arimidex  for 10 years ending in August 2031.  She has received adjuvant Zometa  in the past.  She would be due for a repeat bone density scan in September of next year.  I will see her back in 6 months no labs.  Chemo-induced peripheral neuropathy: Continue gabapentin    Visit Diagnosis 1. Encounter for follow-up surveillance of breast cancer   2. Visit for monitoring Arimidex  therapy   3. High risk medication use   4. Chemotherapy-induced peripheral neuropathy (HCC)      Dr. Annah Skene, MD, MPH [facility] [phone] 12/04/2023 9:06 AM

## 2023-12-05 LAB — GENECONNECT MOLECULAR SCREEN: Genetic Analysis Overall Interpretation: NEGATIVE

## 2023-12-06 ENCOUNTER — Encounter: Payer: Self-pay | Admitting: Neurosurgery

## 2023-12-06 ENCOUNTER — Ambulatory Visit (INDEPENDENT_AMBULATORY_CARE_PROVIDER_SITE_OTHER): Admitting: Neurosurgery

## 2023-12-06 ENCOUNTER — Ambulatory Visit: Admitting: Student in an Organized Health Care Education/Training Program

## 2023-12-06 ENCOUNTER — Encounter: Payer: Self-pay | Admitting: Student in an Organized Health Care Education/Training Program

## 2023-12-06 VITALS — BP 118/78 | Ht 63.0 in | Wt 181.0 lb

## 2023-12-06 VITALS — BP 118/72 | HR 88 | Temp 97.8°F | Ht 63.0 in | Wt 181.2 lb

## 2023-12-06 DIAGNOSIS — J4540 Moderate persistent asthma, uncomplicated: Secondary | ICD-10-CM

## 2023-12-06 DIAGNOSIS — Z23 Encounter for immunization: Secondary | ICD-10-CM | POA: Diagnosis not present

## 2023-12-06 DIAGNOSIS — J4520 Mild intermittent asthma, uncomplicated: Secondary | ICD-10-CM

## 2023-12-06 DIAGNOSIS — J309 Allergic rhinitis, unspecified: Secondary | ICD-10-CM

## 2023-12-06 DIAGNOSIS — G5602 Carpal tunnel syndrome, left upper limb: Secondary | ICD-10-CM

## 2023-12-06 DIAGNOSIS — Z09 Encounter for follow-up examination after completed treatment for conditions other than malignant neoplasm: Secondary | ICD-10-CM

## 2023-12-06 DIAGNOSIS — G5603 Carpal tunnel syndrome, bilateral upper limbs: Secondary | ICD-10-CM

## 2023-12-06 MED ORDER — BUDESONIDE-FORMOTEROL FUMARATE 160-4.5 MCG/ACT IN AERO: 2.0000 | INHALATION_SPRAY | Freq: Two times a day (BID) | RESPIRATORY_TRACT | 12 refills | Status: AC

## 2023-12-06 MED ORDER — FLUTICASONE PROPIONATE 50 MCG/ACT NA SUSP: 1.0000 | Freq: Every day | NASAL | 3 refills | Status: AC

## 2023-12-06 MED ORDER — TRAMADOL HCL 50 MG PO TABS: 50.0000 mg | ORAL_TABLET | Freq: Four times a day (QID) | ORAL | 0 refills | Status: AC | PRN

## 2023-12-06 NOTE — Progress Notes (Signed)
   REFERRING PHYSICIAN:  No referring provider defined for this encounter.  DOS: 10/26/23, L [facility]  HISTORY OF PRESENT ILLNESS: Leah [name] is 6 weeks status post ultrasound-guided carpal tunnel release on the left side very well.  She is significantly improved.  Her numbness and tingling is gone.  She continues to have right handed symptoms with known moderate to severe carpal tunnel syndrome on the contralateral side.  PHYSICAL EXAMINATION:  NEUROLOGICAL:  General: In no acute distress.   Awake, alert, oriented to person, place, and time.  Pupils equal round and reactive to light.  Facial tone is symmetric.    Strength: Strength is to baseline. Incision c/d/I  Imaging:  No new imaging  Assessment / Plan: Leah [name] is doing well after left ultrasound-guided carpal tunnel release 6 weeks ago.  Her symptoms have improved significantly.  She is looking forward to having her right sided carpal tunnel release likely in a few months.  She will notify us  when she would like this to take place.  Advised to contact the office if any questions or concerns arise.   Leah [name] Sharps MD Dept of Neurosurgery

## 2023-12-06 NOTE — Progress Notes (Unsigned)
 Synopsis: Referred in *** by No ref. provider found  Assessment & Plan:   #Mild intermittent asthma without complication #Allergic rhinitis  She has a history of childhood asthma and is requiring ICS/LABA controller therapy given continued exposures to her allergen (cat dander and dog dander noted on blood work).  Allergen testing suggests T-helper cell type II response and given continued exposure to dog dander I will continue her on ICS/LABA for now.  Counseled on allergen avoidance but this is not possible.  PFTs previously performed in clinic were consistent with asthma and do not suggest COPD overlap.  She does report increased nasal stuffiness which I suspect is related to the newly acquired dog.  I will prescribe her intranasal corticosteroids with Flonase  to augment her second-generation nonsedating antihistamine.  - fluticasone  (FLONASE ) 50 MCG/ACT nasal spray; Place 1 spray into both nostrils daily.  Dispense: 18.2 mL; Refill: 3 - budesonide -formoterol  (SYMBICORT ) 160-4.5 MCG/ACT inhaler; Inhale 2 puffs into the lungs in the morning and at bedtime. Please schedule office visit before any future refills.  Dispense: 10.2 g; Refill: 12  #OSA  She does have sleep apnea with split-night sleep study showing AHI of 26.  She was started on CPAP with a pressure of 12 with improvement in AHI on the compliance report.  She is having issues with compliance, and is less compliant during today's evaluation.  She is using it 22% of the time (20 days out of 90 where she uses for more than 4 hours.  She feels unable to sleep with it.  I offered her referral to sleep medicine for better evaluation and she would like to explore that.  -Recommended CPAP compliance -Referral to sleep medicine   Return in about 1 year (around 12/05/2024).  I spent *** minutes caring for this patient today, including {EM billing:28027}  Belva November, MD [facility] 12/06/2023 9:04 AM    End of  visit medications:  Meds ordered this encounter  Medications   fluticasone  (FLONASE ) 50 MCG/ACT nasal spray    Sig: Place 1 spray into both nostrils daily.    Dispense:  18.2 mL    Refill:  3   budesonide -formoterol  (SYMBICORT ) 160-4.5 MCG/ACT inhaler    Sig: Inhale 2 puffs into the lungs in the morning and at bedtime. Please schedule office visit before any future refills.    Dispense:  10.2 g    Refill:  12     Current Outpatient Medications:    acetaminophen  (TYLENOL ) 500 MG tablet, Take 500-1,000 mg by mouth every 6 (six) hours as needed (for pain.)., Disp: , Rfl:    albuterol  (PROVENTIL ) (2.5 MG/3ML) 0.083% nebulizer solution, Take 3 mLs (2.5 mg total) by nebulization every 6 (six) hours as needed for wheezing or shortness of breath., Disp: 150 mL, Rfl: 1   albuterol  (VENTOLIN  HFA) 108 (90 Base) MCG/ACT inhaler, Inhale 2 puffs into the lungs every 4 (four) hours as needed for wheezing or shortness of breath., Disp: 54 g, Rfl: 11   anastrozole  (ARIMIDEX ) 1 MG tablet, Take 1 tablet (1 mg total) by mouth daily., Disp: 90 tablet, Rfl: 2   buPROPion  (WELLBUTRIN  XL) 300 MG 24 hr tablet, Take 1 tablet (300 mg total) by mouth daily., Disp: 90 tablet, Rfl: 0   cetirizine  (ZYRTEC ) 10 MG tablet, TAKE 1 TABLET BY MOUTH EVERY DAY AS NEEDED FOR ALLERGY, Disp: 90 tablet, Rfl: 1   Cholecalciferol  (VITAMIN D -3) 125 MCG (5000 UT) TABS, Take 5,000 Units by mouth in the  morning., Disp: , Rfl:    Cyanocobalamin  (VITAMIN B-12 PO), Take 1,000 mcg by mouth in the morning., Disp: , Rfl:    fluticasone  (FLONASE ) 50 MCG/ACT nasal spray, Place 1 spray into both nostrils daily., Disp: 18.2 mL, Rfl: 3   gabapentin  (NEURONTIN ) 300 MG capsule, Take 1 capsule (300 mg total) by mouth at bedtime., Disp: 30 capsule, Rfl: 3   gabapentin  (NEURONTIN ) 600 MG tablet, TAKE 1 TABLET BY MOUTH THREE TIMES A DAY (Patient taking differently: Take 600 mg by mouth in the morning and at bedtime.), Disp: 90 tablet, Rfl: 3   omeprazole   (PRILOSEC) 40 MG capsule, TAKE 1 CAPSULE (40 MG TOTAL) BY MOUTH EVERY EVENING. 30 MIN BEFORE FOOD, Disp: 90 capsule, Rfl: 1   PARoxetine  (PAXIL ) 10 MG tablet, TAKE 1 TABLET BY MOUTH EVERYDAY AT BEDTIME, Disp: 90 tablet, Rfl: 1   Spacer/Aero-Holding Chambers (AEROCHAMBER MV) inhaler, Use as instructed, Disp: 1 each, Rfl: 0   traMADol  (ULTRAM ) 50 MG tablet, Take 1 tablet (50 mg total) by mouth every 6 (six) hours as needed., Disp: 20 tablet, Rfl: 0   traZODone  (DESYREL ) 50 MG tablet, Take 1 tablet (50 mg total) by mouth at bedtime as needed for sleep., Disp: 90 tablet, Rfl: 3   valACYclovir  (VALTREX ) 1000 MG tablet, Take 1 tablet (1,000 mg total) by mouth 2 (two) times daily. For outbreak, then continue one tablet daily for suppression, Disp: 104 tablet, Rfl: 3   budesonide -formoterol  (SYMBICORT ) 160-4.5 MCG/ACT inhaler, Inhale 2 puffs into the lungs in the morning and at bedtime. Please schedule office visit before any future refills., Disp: 10.2 g, Rfl: 12   Subjective:   PATIENT ID: Leah [name] GENDER: female DOB: 03/23/1965, MRN: [id number]  Chief Complaint  Patient presents with   Asthma    No cough. No SOB. Nasal congestion for a week and a half. Using Symbicort  and it is helping.   Sleep Apnea    Not using CPAP. Keeps taking it off in the middle of the night.    HPI  The patient is a pleasant 58 year old female presenting for follow-up of asthma as well as OSA.  I initially saw her after a recent asthma exacerbation. Where she was seen in the ED. She was treated with nebulizers, steroids, and discharged with an Advair HFA inhaler.  She has had multiple episodes similar to this throughout the summer of 2024 and received 5 courses of prednisone  to treat asthma exacerbations. Patient has perennial allergies. She reports a runny nose and is currently using claritin  for this. She's previously used flonase , but reports it makes her nose stuffier.  Return visit 04/10/2023   Since our  last visit, she's had a sleep study showing AHI of 26, and was started on CPAP with good response. She continues to be compliant with Symbicort , using it twice a day. She continues to be exposed to dog dander. She is overall compliant with her CPAP, but hasn't used it for the past week. Mood is improved with use of CPAP, and she feels irritable when not using it. Using albuterol  around once a day, and has occasional bouts of wheezing that responds well to albuterol . She is aware that she is allergic to dogs.  Return visit 12/06/2023  Presents today for follow-up and feels great overall.  Her respiratory symptoms are significantly improved and she has not had any exacerbations since our last visit.  She has been very well-controlled and her last exacerbation was in January 2024.  She does not endorse any wheezing, cough, or chest tightness.  She has not had to use her rescue inhaler.  She is compliant with her Symbicort  and washes her mouth after every use. Patient does report increased nasal stuffiness and rhinorrhea which has worsened over the past couple of weeks. She is taking ceterizine for this but without much improvement. This coincided with acquisition of a new dog.  She is not able to use her CPAP and feels that it comes off every night.    Her past medical history is notable for breast cancer status postresection, chemotherapy, and radiation therapy.  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  She reports multiple allergies.  She had asthma growing up. She has a son who has multiple allergies.   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  She is a non-smoker but did smoke in the past, quit in 2008.  She smoked for 20 years with around 30 to 40 pack years of smoking history on her. She denies any illicit drug use and denies any vaping. She has three dogs.  Ancillary information including prior medications, full medical/surgical/family/social histories, and PFTs  (when available) are listed below and have been reviewed.    Review of Systems  Constitutional:  Negative for chills, fever, malaise/fatigue and weight loss.  HENT:  Positive for congestion.   Respiratory:  Negative for cough, hemoptysis, sputum production, shortness of breath and wheezing.   Cardiovascular:  Negative for chest pain.     Objective:   Vitals:   12/06/23 0846  BP: 118/72  Pulse: 88  Temp: 97.8 F (36.6 C)  SpO2: 97%  Weight: 181 lb 3.2 oz (82.2 kg)  Height: 5' 3 (1.6 m)   97% on RA  BMI Readings from Last 3 Encounters:  12/06/23 32.10 kg/m  12/04/23 32.47 kg/m  11/08/23 32.24 kg/m   Wt Readings from Last 3 Encounters:  12/06/23 181 lb 3.2 oz (82.2 kg)  12/04/23 183 lb 4.8 oz (83.1 kg)  11/08/23 182 lb (82.6 kg)    Physical Exam Constitutional:      Appearance: Normal appearance.  Cardiovascular:     Rate and Rhythm: Normal rate and regular rhythm.     Pulses: Normal pulses.     Heart sounds: Normal heart sounds.  Pulmonary:     Effort: Pulmonary effort is normal.     Breath sounds: Normal breath sounds.  Musculoskeletal:        General: Normal range of motion.  Neurological:     General: No focal deficit present.     Mental Status: She is alert and oriented to person, place, and time. Mental status is at baseline.       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 Wadie Delaine    Family history of breast cancer    Family history of prostate cancer    Gastroesophageal reflux disease without esophagitis 04/09/2022   Herniated disc, cervical    s/p epidural injection   Herpes    History of kidney stones  Hyperlipidemia    Low back pain    Migraines    Personal history of chemotherapy    Personal history of radiation therapy    Pneumonia      Family History  Problem Relation Age of Onset   Breast cancer Mother        dx late 17s   Thyroid  disease Mother    Colon polyps Mother    Prostate cancer Father        dx 10   Breast cancer Maternal Grandmother        dx late 81s   Osteoporosis Maternal Grandmother    Cancer Maternal Uncle        possibly, unsure type     Past Surgical History:  Procedure Laterality Date   ANTERIOR CERVICAL DECOMP/DISCECTOMY FUSION N/A 07/29/2022   Procedure: C5-T1 ANTERIOR CERVICAL DISCECTOMY AND FUSION;  Surgeon: Clois Fret, MD;  Location: [facility] ORS;  Service: Neurosurgery;  Laterality: N/A;   APPLICATION OF INTRAOPERATIVE CT SCAN N/A 07/29/2022   Procedure: APPLICATION OF INTRAOPERATIVE CT SCAN;  Surgeon: Clois Fret, MD;  Location: [facility];  Service: Neurosurgery;  Laterality: N/A;   BREAST BIOPSY Left 12/25/2018   US  BX, invasive mammary carcinoma    BREAST LUMPECTOMY     BREAST LUMPECTOMY WITH NEEDLE LOCALIZATION AND AXILLARY SENTINEL LYMPH NODE BX Left 01/16/2019   Procedure: LEFT BREAST LUMPECTOMY WITH NEEDLE LOCALIZATION AND SENTINEL LYMPH NODE BIOPSY;  Surgeon: Curvin Deward MOULD, MD;  Location: [facility];  Service: General;  Laterality: Left;   CARPAL TUNNEL RELEASE Left 10/26/2023   Procedure: CARPAL TUNNEL RELEASE;  Surgeon: Claudene Penne ORN, MD;  Location: [facility];  Service: Neurosurgery;  Laterality: Left;  LEFT CARPAL TUNNEL RELEASE WITH ULTRASOUND GUIDANCE, POSSIBLE CONVERT TO OPEN   CESAREAN SECTION     COLONOSCOPY WITH PROPOFOL  N/A 03/27/2017   Procedure: COLONOSCOPY WITH PROPOFOL ;  Surgeon: Therisa Bi, MD;  Location: [facility] ENDOSCOPY;  Service: Gastroenterology;  Laterality: N/A;   COLONOSCOPY WITH PROPOFOL  N/A 04/04/2022   Procedure: COLONOSCOPY WITH PROPOFOL ;  Surgeon: Therisa Bi, MD;  Location: [facility] ENDOSCOPY;  Service:  Gastroenterology;  Laterality: N/A;   ESOPHAGOGASTRODUODENOSCOPY     ESOPHAGOGASTRODUODENOSCOPY (EGD) WITH PROPOFOL  N/A 03/27/2017   Procedure: ESOPHAGOGASTRODUODENOSCOPY (EGD) WITH PROPOFOL  WITH DILATION;  Surgeon: Therisa Bi, MD;  Location: [facility] ENDOSCOPY;  Service: Gastroenterology;  Laterality: N/A;   PORT-A-CATH REMOVAL     PORTACATH PLACEMENT N/A 02/15/2019   Procedure: INSERTION PORT-A-CATH WITH POSSIBLE ULTRASOUND;  Surgeon: Curvin Deward MOULD, MD;  Location: [facility] ORS;  Service: General;  Laterality: N/A;   POSTERIOR CERVICAL FUSION/FORAMINOTOMY N/A 07/29/2022   Procedure: C2-T2 POSTERIOR SPINAL FUSION;  Surgeon: Clois Fret, MD;  Location: [facility];  Service: Neurosurgery;  Laterality: N/A;   RE-EXCISION OF BREAST CANCER,SUPERIOR MARGINS Left 02/15/2019   Procedure: RE-EXCISION OF LEFT BREAST CANCER ANTERIOR MARGINS;  Surgeon: Curvin Deward MOULD, MD;  Location: [facility] ORS;  Service: General;  Laterality: Left;   THROAT SURGERY  2015   UMBILICAL HERNIA REPAIR  2006    x 2   w mesh per pt    Social History   Socioeconomic History   Marital status: Single    Spouse name: Not on file   Number of children: Not on file   Years of education: Not on file   Highest education level: Some college, no degree  Occupational History   Not on file  Tobacco Use   Smoking status: Former    Current packs/day: 0.00    Average packs/day:  2.0 packs/day for 20.0 years (40.0 ttl pk-yrs)    Types: Cigarettes    Start date: 06/02/1986    Quit date: 06/02/2006    Years since quitting: 17.5   Smokeless tobacco: Never  Vaping Use   Vaping status: Never Used  Substance and Sexual Activity   Alcohol use: Yes    Comment: rarely   Drug use: No   Sexual activity: Yes    Partners: Male  Other Topics Concern   Not on file  Social History Narrative   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 As of 02/26/17     Divorced.    Former smoker 20+ years quit in 1997 then started again for 5 years and quit 10-12 years ago as of 03/08/17    Social Drivers of Health   Financial Resource Strain: Medium Risk (10/26/2023)   Overall Financial Resource Strain (CARDIA)    Difficulty of Paying Living Expenses: Somewhat hard  Food Insecurity: Food Insecurity Present (10/26/2023)   Hunger Vital Sign    Worried About Running Out of Food in the Last Year: Sometimes true    Ran Out of Food in the Last Year: Never true  Transportation Needs: No Transportation Needs (10/26/2023)   PRAPARE - Administrator, Civil Service (Medical): No    Lack of Transportation (Non-Medical): No  Physical Activity: Insufficiently Active (10/26/2023)   Exercise Vital Sign    Days of Exercise per Week: 2 days    Minutes of Exercise per Session: 20 min  Stress: Stress Concern Present (10/26/2023)   [employer] of Occupational Health - Occupational Stress Questionnaire    Feeling of Stress: Very much  Social Connections: Socially Isolated (10/26/2023)   Social Connection and Isolation Panel    Frequency of Communication with Friends and Family: Once a week    Frequency of Social Gatherings with Friends and Family: Never    Attends Religious Services: Never    Database administrator or Organizations: No    Attends Engineer, structural: Not on file    Marital Status: Divorced  Intimate Partner Violence: Not At Risk (07/29/2022)   Humiliation, Afraid, Rape, and Kick questionnaire    Fear of Current or Ex-Partner: No    Emotionally Abused: No    Physically Abused: No    Sexually Abused: No     Allergies  Allergen Reactions   Hydrocodone -Acetaminophen  Hives    All over her body.   Lactose Intolerance (Gi) Other (See Comments)    Bloating and GI upset   Cephalexin  Itching   Penicillins Rash    Childhood reaction    Sulfa Antibiotics Rash    Full body rash      CBC    Component Value Date/Time   WBC  6.7 10/19/2023 0951   RBC 4.48 10/19/2023 0951   HGB 14.4 10/19/2023 0951   HGB 14.8 05/29/2023 1347   HCT 41.7 10/19/2023 0951   PLT 366 10/19/2023 0951   PLT 323 05/29/2023 1347   MCV 93.1 10/19/2023 0951   MCH 32.1 10/19/2023 0951   MCHC 34.5 10/19/2023 0951   RDW 11.7 10/19/2023 0951   LYMPHSABS 1.7 05/29/2023 1347   MONOABS 0.6 05/29/2023 1347   EOSABS 0.4 05/29/2023 1347   BASOSABS 0.1 05/29/2023 1347    Pulmonary Functions Testing Results:    Latest Ref Rng & Units  05/12/2022    9:34 AM  PFT Results  FVC-Pre L 3.13   FVC-Predicted Pre % 93   FVC-Post L 3.21   FVC-Predicted Post % 96   Pre FEV1/FVC % % 72   Post FEV1/FCV % % 75   FEV1-Pre L 2.27   FEV1-Predicted Pre % 86   FEV1-Post L 2.40   DLCO uncorrected ml/min/mmHg 16.67   DLCO UNC% % 83   DLVA Predicted % 80   TLC L 5.20   TLC % Predicted % 106   RV % Predicted % 109     Outpatient Medications Prior to Visit  Medication Sig Dispense Refill   acetaminophen  (TYLENOL ) 500 MG tablet Take 500-1,000 mg by mouth every 6 (six) hours as needed (for pain.).     albuterol  (PROVENTIL ) (2.5 MG/3ML) 0.083% nebulizer solution Take 3 mLs (2.5 mg total) by nebulization every 6 (six) hours as needed for wheezing or shortness of breath. 150 mL 1   albuterol  (VENTOLIN  HFA) 108 (90 Base) MCG/ACT inhaler Inhale 2 puffs into the lungs every 4 (four) hours as needed for wheezing or shortness of breath. 54 g 11   anastrozole  (ARIMIDEX ) 1 MG tablet Take 1 tablet (1 mg total) by mouth daily. 90 tablet 2   buPROPion  (WELLBUTRIN  XL) 300 MG 24 hr tablet Take 1 tablet (300 mg total) by mouth daily. 90 tablet 0   cetirizine  (ZYRTEC ) 10 MG tablet TAKE 1 TABLET BY MOUTH EVERY DAY AS NEEDED FOR ALLERGY 90 tablet 1   Cholecalciferol  (VITAMIN D -3) 125 MCG (5000 UT) TABS Take 5,000 Units by mouth in the morning.     Cyanocobalamin  (VITAMIN B-12 PO) Take 1,000 mcg by mouth in the morning.     gabapentin  (NEURONTIN ) 300 MG capsule Take 1 capsule  (300 mg total) by mouth at bedtime. 30 capsule 3   gabapentin  (NEURONTIN ) 600 MG tablet TAKE 1 TABLET BY MOUTH THREE TIMES A DAY (Patient taking differently: Take 600 mg by mouth in the morning and at bedtime.) 90 tablet 3   omeprazole  (PRILOSEC) 40 MG capsule TAKE 1 CAPSULE (40 MG TOTAL) BY MOUTH EVERY EVENING. 30 MIN BEFORE FOOD 90 capsule 1   PARoxetine  (PAXIL ) 10 MG tablet TAKE 1 TABLET BY MOUTH EVERYDAY AT BEDTIME 90 tablet 1   Spacer/Aero-Holding Chambers (AEROCHAMBER MV) inhaler Use as instructed 1 each 0   traMADol  (ULTRAM ) 50 MG tablet Take 1 tablet (50 mg total) by mouth every 6 (six) hours as needed. 20 tablet 0   traZODone  (DESYREL ) 50 MG tablet Take 1 tablet (50 mg total) by mouth at bedtime as needed for sleep. 90 tablet 3   valACYclovir  (VALTREX ) 1000 MG tablet Take 1 tablet (1,000 mg total) by mouth 2 (two) times daily. For outbreak, then continue one tablet daily for suppression 104 tablet 3   budesonide -formoterol  (SYMBICORT ) 160-4.5 MCG/ACT inhaler Inhale 2 puffs into the lungs in the morning and at bedtime. Please schedule office visit before any future refills. 10.2 g 2   No facility-administered medications prior to visit.

## 2023-12-08 ENCOUNTER — Other Ambulatory Visit: Payer: Self-pay | Admitting: Oncology

## 2023-12-11 ENCOUNTER — Other Ambulatory Visit: Payer: Self-pay

## 2023-12-11 ENCOUNTER — Ambulatory Visit

## 2023-12-11 VITALS — BP 120/76 | HR 80 | Temp 99.2°F | Ht 63.0 in | Wt 181.8 lb

## 2023-12-11 DIAGNOSIS — A6000 Herpesviral infection of urogenital system, unspecified: Secondary | ICD-10-CM | POA: Diagnosis not present

## 2023-12-11 DIAGNOSIS — E538 Deficiency of other specified B group vitamins: Secondary | ICD-10-CM | POA: Diagnosis not present

## 2023-12-11 DIAGNOSIS — E782 Mixed hyperlipidemia: Secondary | ICD-10-CM

## 2023-12-11 DIAGNOSIS — J309 Allergic rhinitis, unspecified: Secondary | ICD-10-CM

## 2023-12-11 DIAGNOSIS — K219 Gastro-esophageal reflux disease without esophagitis: Secondary | ICD-10-CM | POA: Diagnosis not present

## 2023-12-11 DIAGNOSIS — Z79899 Other long term (current) drug therapy: Secondary | ICD-10-CM

## 2023-12-11 DIAGNOSIS — I8393 Asymptomatic varicose veins of bilateral lower extremities: Secondary | ICD-10-CM | POA: Diagnosis not present

## 2023-12-11 DIAGNOSIS — E559 Vitamin D deficiency, unspecified: Secondary | ICD-10-CM | POA: Diagnosis not present

## 2023-12-11 DIAGNOSIS — F39 Unspecified mood [affective] disorder: Secondary | ICD-10-CM

## 2023-12-11 DIAGNOSIS — G4700 Insomnia, unspecified: Secondary | ICD-10-CM

## 2023-12-11 DIAGNOSIS — E876 Hypokalemia: Secondary | ICD-10-CM | POA: Insufficient documentation

## 2023-12-11 DIAGNOSIS — R7303 Prediabetes: Secondary | ICD-10-CM | POA: Diagnosis not present

## 2023-12-11 LAB — COMPREHENSIVE METABOLIC PANEL WITH GFR
ALT: 16 U/L (ref 0–35)
AST: 15 U/L (ref 0–37)
Albumin: 4.2 g/dL (ref 3.5–5.2)
Alkaline Phosphatase: 63 U/L (ref 39–117)
BUN: 15 mg/dL (ref 6–23)
CO2: 32 meq/L (ref 19–32)
Calcium: 9.7 mg/dL (ref 8.4–10.5)
Chloride: 100 meq/L (ref 96–112)
Creatinine, Ser: 0.65 mg/dL (ref 0.40–1.20)
GFR: 98.82 mL/min (ref >60.00)
Glucose, Bld: 106 mg/dL — ABNORMAL HIGH (ref 70–99)
Potassium: 4.3 meq/L (ref 3.5–5.1)
Sodium: 139 meq/L (ref 135–145)
Total Bilirubin: 0.4 mg/dL (ref 0.2–1.2)
Total Protein: 6.8 g/dL (ref 6.0–8.3)

## 2023-12-11 LAB — VITAMIN B12: Vitamin B-12: 1405 pg/mL — ABNORMAL HIGH (ref 211–911)

## 2023-12-11 LAB — LIPID PANEL
Cholesterol: 287 mg/dL — ABNORMAL HIGH (ref 0–200)
HDL: 41.3 mg/dL (ref >39.00)
LDL Cholesterol: 169 mg/dL — ABNORMAL HIGH (ref 0–99)
NonHDL: 245.22
Total CHOL/HDL Ratio: 7
Triglycerides: 379 mg/dL — ABNORMAL HIGH (ref 0.0–149.0)
VLDL: 75.8 mg/dL — ABNORMAL HIGH (ref 0.0–40.0)

## 2023-12-11 LAB — HEMOGLOBIN A1C: Hgb A1c MFr Bld: 5.7 % (ref 4.6–6.5)

## 2023-12-11 LAB — VITAMIN D 25 HYDROXY (VIT D DEFICIENCY, FRACTURES): VITD: 48.45 ng/mL (ref 30.00–100.00)

## 2023-12-11 MED ORDER — OMEPRAZOLE 40 MG PO CPDR: DELAYED_RELEASE_CAPSULE | ORAL | 3 refills | Status: DC

## 2023-12-11 MED ORDER — CETIRIZINE HCL 10 MG PO TABS: 10.0000 mg | ORAL_TABLET | Freq: Every day | ORAL | 1 refills | Status: AC | PRN

## 2023-12-11 MED ORDER — TRAZODONE HCL 50 MG PO TABS: 50.0000 mg | ORAL_TABLET | Freq: Every evening | ORAL | 3 refills | Status: AC | PRN

## 2023-12-11 MED ORDER — BUPROPION HCL ER (XL) 300 MG PO TB24: 300.0000 mg | ORAL_TABLET | Freq: Every day | ORAL | 3 refills | Status: AC

## 2023-12-11 NOTE — Assessment & Plan Note (Signed)
 Has been taking  Omeprazole  at night, with partial symptoms improvement. I recommend trial of Omeprazole  40 mg take it in morning, 30 min before eating food and reevaluate in 4-5 months. Consider GI referral for EGD if symptoms persists despite taking this in the morning.

## 2023-12-11 NOTE — Assessment & Plan Note (Signed)
 Noted on last BMP from 10/2023, repeat CMP today.

## 2023-12-11 NOTE — Assessment & Plan Note (Addendum)
 Reviewed PHQ-9, GAD7, no SI/HI.  Currently on Wellbutrin  300 mg, Paroxetine  10 mg daily, continue.  Before adjusting medications recommend resuming counseling. She will reach out to previous counselor that she was established with.  If she is unable to re-establish care, recommend she reaches out to us  and I will put a referral to behavioral health department.  F/U in 4-5 months or sooner.

## 2023-12-11 NOTE — Assessment & Plan Note (Signed)
 Patient started taking Trazodone  50 mg at bedtime. I discussed potential s/e of taking Trazodone  with paroxetine .  She also has untreated OSA and has upcoming appointment with sleep specialist.   Recommend treatment of OSA and f/u in about 4 months for medication adjustment. Recommend only taking Trazodone  50 mg prn at bedtime.

## 2023-12-11 NOTE — Assessment & Plan Note (Signed)
 Continue nasal Flonase  daily as recommended by pulmonologist, take prn Cetrizine 10 mg daily if symptoms not stable with nasal Flonase .

## 2023-12-11 NOTE — Assessment & Plan Note (Signed)
 Varicose veins likely due to prolonged standing and genetics. No swelling noted. Recommend wearing compression socks with 20-25 mmHg pressure.

## 2023-12-11 NOTE — Assessment & Plan Note (Signed)
 Vitamin D  low on 10/04/2018 with currently treatment with vitamin D3 5,000 units daily. Continue.  Also recommend repeat vitamin D  level to ensure it stays normal.

## 2023-12-11 NOTE — Assessment & Plan Note (Signed)
 Previously treated with Atorvastatin , did not tolerate it (had aches, pain). Repeat lipid panel today, if LDL >100 consider trial of Rosuvastatin 5 mg daily.

## 2023-12-11 NOTE — Progress Notes (Signed)
 Established Patient Office Visit TOC from Dr. Hope    Subjective  Patient ID: Leah [name], female    DOB: 1965-11-01  Age: 58 y.o. MRN: [id number]  Chief Complaint  Patient presents with   Establish Care    She  has a past medical history of Allergy (1970), Anxiety, Arthritis, Breast cancer (HCC), Carpal tunnel syndrome of right wrist, Childhood asthma, Colon polyps, Depression, Dyspnea, Eosinophilic esophagitis, Esophageal stricture, Family history of breast cancer, Family history of prostate cancer, Gastroesophageal reflux disease without esophagitis (04/09/2022), Herniated disc, cervical, Herpes, History of kidney stones, Hyperlipidemia, Low back pain, Migraines, Osteoporosis (Aug 2021), Personal history of chemotherapy, Personal history of radiation therapy, Pneumonia, and Tinea corporis (10/03/2017).  HPI  Discussed the use of AI scribe software for clinical note transcription with the patient, who gave verbal consent to proceed.  History of Present Illness Patient presents for transfer of care from previous PCP and chronic disease management.   - Breast cancer, left (Ia invasive mammary carcinoma pT1b pN1 cM0 ER/PR positive HER-2/neu negative s/p lumpectomy twice, adjuvant chemotherapy and radiation currently on Arimidex . She sees Dr. Melanee regularly for this): She has a history of breast cancer diagnosed in 2020, treated with surgeries, chemotherapy, and radiation, and has been on anastrozole  since 2021 with plans to continue for ten years. She is established with oncologist Dr. Melanee. She is currently on Gabapentin  600 mg in AM and PM with occasional 300 mg in the afternoon for hot flashes as well. She denies s/e related to medications and reports Gabapentin  has helped to alleviate symptoms. She is also prescribed Paroxetine  10 mg daily by her oncologist which she takes daily. She has been treated with zoledronic  acid infusion in March 2025.  Last DEXA was on 11/2022, currently off  Zoledronic  acid infusion.    - She has mild intermittent asthma, uses Symbicort  twice daily, and has not needed albuterol  recently. Established with pulmunologist Dr. Isadora, last visit on 12/06/23.   - She experiences acid reflux with severe symptoms at times, requiring vomiting for relief. She takes Prilosec 40 mg at night.  - She has a history of depression and anxiety, managed with Wellbutrin  300 mg and Paxil  10 mg daily.  She is experiencing stress and considering resuming counseling.  - She has a history of sleep apnea, reports snoring, and has previously tried CPAP, which was uncomfortable. She is scheduled to see a sleep specialist soon.   - She has varicose veins of b/l lower legs for years.   - She takes vitamin D3 and B12 supplements due to previous deficiencies. Her diet is generally healthy, with her son ensuring she eats well.  B12: 10/04/2018: 218, currently on B 12 OTC 1000 mcg daily. Vitamin D  low on 10/04/2018 20.08 , currently on D3 5,000 units daily.   - She has a history of genital herpes and takes Valtrex  at the onset of symptoms.  - B/L carpel tunnel syndrome, underwent ultrasound guided Carpel tunnel release on left side with persistent symptom on right side. Following up with neurosurgeon Dr. Penne Sharps, currently on. Last OV was on 12/06/23.  She plans on scheduling surgery on right side side. She currently take Tramadol  50 mg when pain is significant.   - Seasonal allergy: On Zyrtec  10 mg daily and recently started on nasal Flonase  by her pulmonologist.    ROS As per HPI    Objective:     BP 120/76 (BP Location: Right Arm, Patient Position: Sitting, Cuff Size: Small)  Pulse 80   Temp 99.2 F (37.3 C) (Oral)   Ht 5' 3 (1.6 m)   Wt 181 lb 12.8 oz (82.5 kg)   SpO2 96%   BMI 32.20 kg/m      12/11/2023   10:05 AM 12/04/2023    9:03 AM 12/30/2022    8:21 AM  Depression screen PHQ 2/9  Decreased Interest 1 0 1  Down, Depressed, Hopeless 1 0 1  PHQ - 2  Score 2 0 2  Altered sleeping 2  3  Tired, decreased energy 2  1  Change in appetite 1  3  Feeling bad or failure about yourself  1  1  Trouble concentrating 2  2  Moving slowly or fidgety/restless 0  0  Suicidal thoughts 0  0  PHQ-9 Score 10  12  Difficult doing work/chores Not difficult at all  Somewhat difficult      12/11/2023   10:05 AM 12/30/2022    8:22 AM 12/16/2022    8:54 AM 11/30/2022   10:59 AM  GAD 7 : Generalized Anxiety Score  Nervous, Anxious, on Edge 1 1 2 3   Control/stop worrying 1 1 3 3   Worry too much - different things 0 1 3 3   Trouble relaxing 2 3 3 3   Restless 0 0 0 2  Easily annoyed or irritable 0 0 1 2  Afraid - awful might happen 0 0 2 3  Total GAD 7 Score 4 6 14 19   Anxiety Difficulty Not difficult at all Somewhat difficult Somewhat difficult Very difficult      12/11/2023   10:05 AM 12/04/2023    9:03 AM 12/30/2022    8:21 AM  Depression screen PHQ 2/9  Decreased Interest 1 0 1  Down, Depressed, Hopeless 1 0 1  PHQ - 2 Score 2 0 2  Altered sleeping 2  3  Tired, decreased energy 2  1  Change in appetite 1  3  Feeling bad or failure about yourself  1  1  Trouble concentrating 2  2  Moving slowly or fidgety/restless 0  0  Suicidal thoughts 0  0  PHQ-9 Score 10  12  Difficult doing work/chores Not difficult at all  Somewhat difficult      12/11/2023   10:05 AM 12/30/2022    8:22 AM 12/16/2022    8:54 AM 11/30/2022   10:59 AM  GAD 7 : Generalized Anxiety Score  Nervous, Anxious, on Edge 1 1 2 3   Control/stop worrying 1 1 3 3   Worry too much - different things 0 1 3 3   Trouble relaxing 2 3 3 3   Restless 0 0 0 2  Easily annoyed or irritable 0 0 1 2  Afraid - awful might happen 0 0 2 3  Total GAD 7 Score 4 6 14 19   Anxiety Difficulty Not difficult at all Somewhat difficult Somewhat difficult Very difficult   SDOH Screenings   Food Insecurity: Food Insecurity Present (10/26/2023)  Housing: High Risk (10/26/2023)  Transportation Needs: No  Transportation Needs (10/26/2023)  Utilities: At Risk (07/29/2022)  Alcohol Screen: Low Risk  (10/26/2023)  Depression (PHQ2-9): Medium Risk (12/11/2023)  Financial Resource Strain: Medium Risk (10/26/2023)  Physical Activity: Insufficiently Active (10/26/2023)  Social Connections: Socially Isolated (10/26/2023)  Stress: Stress Concern Present (10/26/2023)  Tobacco Use: Medium Risk (12/11/2023)     Physical Exam Constitutional:      Appearance: Normal appearance.  HENT:     Head: Normocephalic and atraumatic.  Right Ear: Tympanic membrane normal. There is no impacted cerumen.     Left Ear: Tympanic membrane normal. There is no impacted cerumen.     Mouth/Throat:     Mouth: Mucous membranes are moist.  Eyes:     Conjunctiva/sclera: Conjunctivae normal.  Neck:     Thyroid : No thyroid  mass or thyroid  tenderness.  Cardiovascular:     Rate and Rhythm: Normal rate and regular rhythm.  Pulmonary:     Effort: Pulmonary effort is normal.     Breath sounds: Normal breath sounds. No wheezing.  Abdominal:     General: Bowel sounds are normal.     Palpations: Abdomen is soft.     Tenderness: There is no guarding or rebound.  Musculoskeletal:     Cervical back: Neck supple. No rigidity.     Right lower leg: No edema.     Left lower leg: No edema.     Comments: B/L lower legs varicose veins without edema, ulcerations.   Skin:    General: Skin is warm.  Neurological:     Mental Status: She is alert and oriented to person, place, and time.     Gait: Gait normal.  Psychiatric:        Mood and Affect: Mood normal.        Behavior: Behavior normal.        No results found for any visits on 12/11/23.  The 10-year ASCVD risk score (Arnett DK, et al., 2019) is: 1.8%     Assessment & Plan:  Patient is a pleasant 58 year old female with known h/o left breast cancer (continue f/u and management with oncology), right carpel tunnel (established with neurosurgery s/p release of left side  with persistent symptoms on right side, continue f/u with neurosurgery), mild intermittent asthma (established with pulmonology with excellent symptoms control) presenting for transfer of care from previous PCP and chronic medication management.   Mood disorder Assessment & Plan: Reviewed PHQ-9, GAD7, no SI/HI.  Currently on Wellbutrin  300 mg, Paroxetine  10 mg daily, continue.  Before adjusting medications recommend resuming counseling. She will reach out to previous counselor that she was established with.  If she is unable to re-establish care, recommend she reaches out to us  and I will put a referral to behavioral health department.  F/U in 4-5 months or sooner.    Orders: -     buPROPion  HCl ER (XL); Take 1 tablet (300 mg total) by mouth daily.  Dispense: 90 tablet; Refill: 3  Gastroesophageal reflux disease without esophagitis Assessment & Plan: Has been taking  Omeprazole  at night, with partial symptoms improvement. I recommend trial of Omeprazole  40 mg take it in morning, 30 min before eating food and reevaluate in 4-5 months. Consider GI referral for EGD if symptoms persists despite taking this in the morning.  Orders: -     Omeprazole ; Take it in empty stomach 30 min before food  Dispense: 90 capsule; Refill: 3  Prediabetes Assessment & Plan: Known history, repeat A1c today.   Orders: -     Hemoglobin A1c  Mixed hyperlipidemia Assessment & Plan: Previously treated with Atorvastatin , did not tolerate it (had aches, pain). Repeat lipid panel today, if LDL >100 consider trial of Rosuvastatin 5 mg daily.   Orders: -     Lipid panel  Allergic rhinitis, unspecified seasonality, unspecified trigger Assessment & Plan: Continue nasal Flonase  daily as recommended by pulmonologist, take prn Cetrizine 10 mg daily if symptoms not stable with nasal Flonase .  Orders: -     Cetirizine  HCl; Take 1 tablet (10 mg total) by mouth daily as needed for allergies.  Dispense: 90 tablet;  Refill: 1  Insomnia, unspecified type Assessment & Plan: Patient started taking Trazodone  50 mg at bedtime. I discussed potential s/e of taking Trazodone  with paroxetine .  She also has untreated OSA and has upcoming appointment with sleep specialist.   Recommend treatment of OSA and f/u in about 4 months for medication adjustment. Recommend only taking Trazodone  50 mg prn at bedtime.   Orders: -     traZODone  HCl; Take 1 tablet (50 mg total) by mouth at bedtime as needed for sleep.  Dispense: 90 tablet; Refill: 3  Medication management Assessment & Plan: On chronic Omeprazole  40 mg daily for GERD with known h/o B12 deficiency evident on lab on 10/04/2018: 218. Currently on supplemental B 23 daily 1000 mcg, continue. Repeat B12 level to ensure normal B 12.   Orders: -     Vitamin B12  Vitamin D  deficiency Assessment & Plan: Vitamin D  low on 10/04/2018 with currently treatment with vitamin D3 5,000 units daily. Continue.  Also recommend repeat vitamin D  level to ensure it stays normal.   Orders: -     VITAMIN D  25 Hydroxy (Vit-D Deficiency, Fractures)  Hypokalemia Assessment & Plan: Noted on last BMP from 10/2023, repeat CMP today.   Orders: -     Comprehensive metabolic panel with GFR  B12 deficiency Assessment & Plan: On chronic Omeprazole  40 mg daily for GERD with known h/o B12 deficiency evident on lab on 10/04/2018: 218. Currently on supplemental B 23 daily 1000 mcg, continue. Repeat B12 level to ensure normal B 12.    Asymptomatic varicose veins of both lower extremities Assessment & Plan: Varicose veins likely due to prolonged standing and genetics. No swelling noted. Recommend wearing compression socks with 20-25 mmHg pressure.   Genital herpes simplex, unspecified site Assessment & Plan: Perianal lesions intermittent which is well controlled with Valtrex  1 gm BID for 7-10 days at onset for symptoms as needed for outbreaks. Continue, not needing refill.       Return in about 4 months (around 04/11/2024) for 4-5 months for chronic f/u (mood) .   Luke Shade, MD  The 10-year ASCVD risk score (Arnett DK, et al., 2019) is: 1.8%   Values used to calculate the score:     Age: 46 years     Clincally relevant sex: Female     Is Non-Hispanic African American: No     Diabetic: No     Tobacco smoker: No     Systolic Blood Pressure: 120 mmHg     Is BP treated: No     HDL Cholesterol: 67.7 mg/dL     Total Cholesterol: 251 mg/dL

## 2023-12-11 NOTE — Assessment & Plan Note (Signed)
 On chronic Omeprazole  40 mg daily for GERD with known h/o B12 deficiency evident on lab on 10/04/2018: 218. Currently on supplemental B 23 daily 1000 mcg, continue. Repeat B12 level to ensure normal B 12.

## 2023-12-11 NOTE — Assessment & Plan Note (Signed)
 Known history, repeat A1c today.

## 2023-12-11 NOTE — Assessment & Plan Note (Signed)
 Perianal lesions intermittent which is well controlled with Valtrex  1 gm BID for 7-10 days at onset for symptoms as needed for outbreaks. Continue, not needing refill.

## 2023-12-12 ENCOUNTER — Ambulatory Visit: Payer: Self-pay

## 2023-12-12 DIAGNOSIS — E782 Mixed hyperlipidemia: Secondary | ICD-10-CM

## 2023-12-12 MED ORDER — ROSUVASTATIN CALCIUM 5 MG PO TABS: 5.0000 mg | ORAL_TABLET | ORAL | 3 refills | Status: AC

## 2023-12-12 NOTE — Progress Notes (Signed)
 The 10-year ASCVD risk score (Arnett DK, et al., 2019) is: 3.4%   Values used to calculate the score:     Age: 58 years     Clincally relevant sex: Female     Is Non-Hispanic African American: No     Diabetic: No     Tobacco smoker: No     Systolic Blood Pressure: 120 mmHg     Is BP treated: No     HDL Cholesterol: 41.3 mg/dL     Total Cholesterol: 287 mg/dL

## 2023-12-19 ENCOUNTER — Encounter: Payer: Self-pay | Admitting: Sleep Medicine

## 2023-12-19 ENCOUNTER — Ambulatory Visit: Admitting: Sleep Medicine

## 2023-12-19 VITALS — BP 118/80 | HR 89 | Temp 99.0°F | Ht 63.0 in | Wt 180.2 lb

## 2023-12-19 DIAGNOSIS — G4733 Obstructive sleep apnea (adult) (pediatric): Secondary | ICD-10-CM | POA: Diagnosis not present

## 2023-12-19 DIAGNOSIS — J45909 Unspecified asthma, uncomplicated: Secondary | ICD-10-CM | POA: Diagnosis not present

## 2023-12-19 NOTE — Progress Notes (Signed)
 Name:Leah [name] MRN: [id number] DOB: Sep 11, 1965   CHIEF COMPLAINT:  REESTABLISH CARE FOR OSA   HISTORY OF PRESENT ILLNESS: [name] is a 58 y.o. w/ a h/o OSA, breast CA, asthma, anxiety, depression, obesity and GERD who presents to reestablish care for OSA. Patient was diagnosed with moderate to severe OSA in 2024 and was subsequently started on CPAP therapy. Reports using CPAP therapy for a few months and discontinued use due to discomfort. Reports nocturnal awakenings due to nocturia and has difficulty falling back to sleep. Denies any significant weight changes. Admits to dry mouth. Denies morning headaches, RLS symptoms, dream enactment, cataplexy, hypnagogic or hypnapompic hallucinations. Denies a family history of sleep apnea. Denies drowsy driving. Drinks 2 cups of coffee daily, denies alcohol, tobacco or illicit drug use.   Bedtime 9-11 pm Sleep onset 1 hour Rise time 7:30 am   EPWORTH SLEEP SCORE 8    12/19/2023    2:00 PM 12/26/2022    2:00 PM  Results of the Epworth flowsheet  Sitting and reading 3 3  Watching TV 3 1  Sitting, inactive in a public place (e.g. a theatre or a meeting) 0 0  As a passenger in a car for an hour without a break 0 1  Lying down to rest in the afternoon when circumstances permit 2 3  Sitting and talking to someone 0 0  Sitting quietly after a lunch without alcohol 0 0  In a car, while stopped for a few minutes in traffic 0 0  Total score 8 8      PAST MEDICAL HISTORY :   has a past medical history of Allergy (1970), Anxiety, Arthritis, Breast cancer (HCC), Carpal tunnel syndrome of right wrist, Childhood asthma, Colon polyps, Depression, Dyspnea, Eosinophilic esophagitis, Esophageal stricture, Family history of breast cancer, Family history of prostate cancer, Gastroesophageal reflux disease without esophagitis (04/09/2022), Herniated disc, cervical, Herpes, History of kidney stones, Hyperlipidemia, Low back pain,  Migraines, Osteoporosis (Aug 2021), Personal history of chemotherapy, Personal history of radiation therapy, Pneumonia, and Tinea corporis (10/03/2017).  has a past surgical history that includes Cesarean section; Esophagogastroduodenoscopy; Umbilical hernia repair (2006); Throat surgery (2015); Esophagogastroduodenoscopy (egd) with propofol  (N/A, 03/27/2017); Colonoscopy with propofol  (N/A, 03/27/2017); Breast lumpectomy with needle localization and axillary sentinel lymph node bx (Left, 01/16/2019); Re-excision of breast cancer,superior margins (Left, 02/15/2019); Portacath placement (N/A, 02/15/2019); Breast biopsy (Left, 12/25/2018); Breast lumpectomy; Colonoscopy with propofol  (N/A, 04/04/2022); Port-a-cath removal; Anterior cervical decomp/discectomy fusion (N/A, 07/29/2022); Posterior cervical fusion/foraminotomy (N/A, 07/29/2022); Application of intraoperative CT scan (N/A, 07/29/2022); Carpal tunnel release (Left, 10/26/2023); and Hernia repair. Prior to Admission medications   Medication Sig Start Date End Date Taking? Authorizing Provider  acetaminophen  (TYLENOL ) 500 MG tablet Take 500-1,000 mg by mouth every 6 (six) hours as needed (for pain.).   Yes [provider]  albuterol  (VENTOLIN  HFA) 108 (90 Base) MCG/ACT inhaler Inhale 2 puffs into the lungs every 4 (four) hours as needed for wheezing or shortness of breath. 04/12/22  Yes Dgayli, Belva, MD  anastrozole  (ARIMIDEX ) 1 MG tablet Take 1 tablet (1 mg total) by mouth daily. 06/30/23  Yes Melanee Annah BROCKS, MD  budesonide -formoterol  (SYMBICORT ) 160-4.5 MCG/ACT inhaler Inhale 2 puffs into the lungs in the morning and at bedtime. Please schedule office visit before any future refills. 12/06/23  Yes Dgayli, Belva, MD  buPROPion  (WELLBUTRIN  XL) 300 MG 24 hr tablet Take 1 tablet (300 mg total) by mouth daily. 12/11/23  Yes Bair, Kalpana,  MD  cetirizine  (ZYRTEC ) 10 MG tablet Take 1 tablet (10 mg total) by mouth daily as needed for allergies.  03/11/24  Yes Bair, Kalpana, MD  Cholecalciferol  (VITAMIN D -3) 125 MCG (5000 UT) TABS Take 5,000 Units by mouth in the morning.   Yes [provider]  Cyanocobalamin  (VITAMIN B-12 PO) Take 1,000 mcg by mouth in the morning.   Yes [provider]  fluticasone  (FLONASE ) 50 MCG/ACT nasal spray Place 1 spray into both nostrils daily. 12/06/23 12/05/24 Yes Dgayli, Belva, MD  gabapentin  (NEURONTIN ) 300 MG capsule Take 1 capsule (300 mg total) by mouth at bedtime. 04/21/23  Yes Melanee Annah BROCKS, MD  gabapentin  (NEURONTIN ) 600 MG tablet TAKE 1 TABLET BY MOUTH THREE TIMES A DAY 12/08/23  Yes Melanee Annah BROCKS, MD  omeprazole  (PRILOSEC) 40 MG capsule Take it in empty stomach 30 min before evening meal once a day 12/11/23  Yes Bair, Kalpana, MD  PARoxetine  (PAXIL ) 10 MG tablet TAKE 1 TABLET BY MOUTH EVERYDAY AT BEDTIME 09/04/23  Yes Rao, Archana C, MD  rosuvastatin (CRESTOR) 5 MG tablet Take 1 tablet (5 mg total) by mouth every other day. 12/12/23  Yes Bair, Kalpana, MD  Spacer/Aero-Holding Chambers (AEROCHAMBER MV) inhaler Use as instructed 04/10/23  Yes Dgayli, Belva, MD  traMADol  (ULTRAM ) 50 MG tablet Take 1 tablet (50 mg total) by mouth every 6 (six) hours as needed. 12/06/23 12/05/24 Yes Claudene Penne ORN, MD  traZODone  (DESYREL ) 50 MG tablet Take 1 tablet (50 mg total) by mouth at bedtime as needed for sleep. 12/11/23  Yes Bair, Kalpana, MD  valACYclovir  (VALTREX ) 1000 MG tablet Take 1 tablet (1,000 mg total) by mouth 2 (two) times daily. For outbreak, then continue one tablet daily for suppression 11/02/23  Yes Marylynn Verneita CROME, MD   Allergies  Allergen Reactions   Hydrocodone -Acetaminophen  Hives    All over her body.   Lactose Intolerance (Gi) Other (See Comments)    Bloating and GI upset   Cephalexin  Itching   Penicillins Rash    Childhood reaction    Sulfa Antibiotics Rash    Full body rash     FAMILY HISTORY:  family history includes Alzheimer's disease in her mother; Arthritis in her  maternal grandmother; Breast cancer in her maternal grandmother and mother; Cancer in her maternal grandmother, maternal uncle, and mother; Colon polyps in her mother; Depression in her father; Osteoporosis in her maternal grandmother; Prostate cancer in her father; Thyroid  disease in her mother; Varicose Veins in her mother. SOCIAL HISTORY:  reports that she quit smoking about 17 years ago. Her smoking use included cigarettes. She started smoking about 37 years ago. She has a 40 pack-year smoking history. She has never used smokeless tobacco. She reports current alcohol use. She reports that she does not use drugs.   Review of Systems:  Gen:  Denies  fever, sweats, chills weight loss  HEENT: Denies blurred vision, double vision, ear pain, eye pain, hearing loss, nose bleeds, sore throat Cardiac:  No dizziness, chest pain or heaviness, chest tightness,edema, No JVD Resp:   No cough, -sputum production, -shortness of breath,-wheezing, -hemoptysis,  Gi: Denies swallowing difficulty, stomach pain, nausea or vomiting, diarrhea, constipation, bowel incontinence Gu:  Denies bladder incontinence, burning urine Ext:   Denies Joint pain, stiffness or swelling Skin: Denies  skin rash, easy bruising or bleeding or hives Endoc:  Denies polyuria, polydipsia , polyphagia or weight change Psych:   Denies depression, insomnia or hallucinations  Other:  All other systems negative  VITAL SIGNS: BP 118/80   Pulse 89   Temp 99 F (37.2 C)   Ht 5' 3 (1.6 m)   Wt 180 lb 3.2 oz (81.7 kg)   SpO2 95%   BMI 31.92 kg/m    Physical Examination:   General Appearance: No distress  EYES PERRLA, EOM intact.   NECK Supple, No JVD Pulmonary: normal breath sounds, No wheezing.  CardiovascularNormal S1,S2.  No m/r/g.   Abdomen: Benign, Soft, non-tender. Skin:   warm, no rashes, no ecchymosis  Extremities: normal, no cyanosis, clubbing. Neuro:without focal findings,  speech normal  PSYCHIATRIC: Mood, affect  within normal limits.   ASSESSMENT AND PLAN  OSA Referring patient to ENT for [facility] evaluation. Discussed the consequences of untreated sleep apnea. Advised not to drive drowsy for safety of patient and others. Will follow up after [facility] evaluation.   Asthma Stable, on current management. Following with PCP.    Patient  satisfied with Plan of action and management. All questions answered  I spent a total of 30 minutes reviewing chart data, face-to-face evaluation with the patient, counseling and coordination of care as detailed above.    Reedy Biernat, M.D.  Sleep Medicine [facility]

## 2023-12-31 ENCOUNTER — Encounter: Payer: Self-pay | Admitting: Sleep Medicine

## 2024-01-09 ENCOUNTER — Ambulatory Visit: Admitting: Student in an Organized Health Care Education/Training Program

## 2024-01-18 ENCOUNTER — Ambulatory Visit (INDEPENDENT_AMBULATORY_CARE_PROVIDER_SITE_OTHER): Admitting: Otolaryngology

## 2024-01-18 ENCOUNTER — Encounter (INDEPENDENT_AMBULATORY_CARE_PROVIDER_SITE_OTHER): Payer: Self-pay | Admitting: Otolaryngology

## 2024-01-18 VITALS — BP 125/78 | HR 88 | Temp 97.9°F | Ht 63.0 in | Wt 176.2 lb

## 2024-01-18 DIAGNOSIS — Z789 Other specified health status: Secondary | ICD-10-CM

## 2024-01-18 DIAGNOSIS — G4733 Obstructive sleep apnea (adult) (pediatric): Secondary | ICD-10-CM

## 2024-01-18 DIAGNOSIS — Z91198 Patient's noncompliance with other medical treatment and regimen for other reason: Secondary | ICD-10-CM

## 2024-01-18 NOTE — Progress Notes (Signed)
 Dear Dr. Jess, Here is my assessment for our mutual patient, Leah [name]. Thank you for allowing me the opportunity to care for your patient. Please do not hesitate to contact me should you have any other questions. Sincerely, Dr. Eldora Blanch  Otolaryngology Clinic Note Referring provider: Dr. Jess HPI:  Leah [name] is a 58 y.o. female kindly referred by Dr. Jess for evaluation of OSA and CPAP intolerance  Initial visit (01/2024): diagnosed with OSA in 2024, tried using CPAP and it worked well for about a month, but then became uncomfortable and takes it off. Difficulty with seal. Some adjustments, but no improvement.  Using CPAP: no Insomnia: uses PRN tradozone -- falling asleep takes about an hour after waking up.  Chest surgery: left sided breast cancer, had two surgeries. Had a port put in on the right side (2020) - no port anymore Denies dysphonia, dysphagia  ENT Surgery: Dysphagia s/p dilation, ACDF (left side) Personal or FHx of bleeding dz or anesthesia difficulty: no  GLP-1: no AP/AC: no  Tobacco: former, quit  PMHx: OSA, Breast Ca, Asthma, MDD/GAD, Insomnia, [place]  Independent Review of Additional Tests or Records:  Dr. Jess (12/19/2023): noted OSA dx in 2024, and started on CPAP; used for few months, discontinued due to discomfort. Difficulty faling back asleep. No RLS sx, or dream enactment; Dx: OSA, CPAP intolerance; Rx: ref to ENT for Inspire Labs BMP and CBC 10/19/2023: BUN/Cr 16/0.57; WBC 6.7Hgb 14.4 Sleep study (01/17/2023): Noted total sleep time only 160 mins; Pre-CPAP: noted AHI 26.3, Did not sleep supine (looks like TST 0%) O2 nadir 78%; Central index: 0  PMH/Meds/All/SocHx/FamHx/ROS:   Past Medical History:  Diagnosis Date   Allergy 1970   When i was little   Anxiety    Arthritis    Breast cancer (HCC)    Carpal tunnel syndrome of right wrist    Childhood asthma    Colon polyps    Depression    Dyspnea    Eosinophilic esophagitis    noted in  [place]  with GI path report 09/29/14    Esophageal stricture    s/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 Wadie Delaine    Family history of breast cancer    Family history of prostate cancer    Gastroesophageal reflux disease without esophagitis 04/09/2022   Herniated disc, cervical    s/p epidural injection   Herpes    History of kidney stones    Hyperlipidemia    Low back pain    Migraines    Osteoporosis Aug 2021   After chemo and radiation   Personal history of chemotherapy    Personal history of radiation therapy    Pneumonia    Tinea corporis 10/03/2017     Past Surgical History:  Procedure Laterality Date   ANTERIOR CERVICAL DECOMP/DISCECTOMY FUSION N/A 07/29/2022   Procedure: C5-T1 ANTERIOR CERVICAL DISCECTOMY AND FUSION;  Surgeon: Clois Fret, MD;  Location: [facility];  Service: Neurosurgery;  Laterality: N/A;   APPLICATION OF INTRAOPERATIVE CT SCAN N/A 07/29/2022   Procedure: APPLICATION OF INTRAOPERATIVE CT SCAN;  Surgeon: Clois Fret, MD;  Location: [facility];  Service: Neurosurgery;  Laterality: N/A;   BREAST BIOPSY Left 12/25/2018   US  BX, invasive mammary carcinoma    BREAST LUMPECTOMY     BREAST LUMPECTOMY WITH NEEDLE LOCALIZATION AND AXILLARY SENTINEL LYMPH NODE BX Left 01/16/2019   Procedure: LEFT BREAST LUMPECTOMY WITH NEEDLE LOCALIZATION AND SENTINEL LYMPH NODE BIOPSY;  Surgeon: Curvin Deward MOULD, MD;  Location:  [facility];  Service: General;  Laterality: Left;   CARPAL TUNNEL RELEASE Left 10/26/2023   Procedure: CARPAL TUNNEL RELEASE;  Surgeon: Claudene Penne ORN, MD;  Location: [facility];  Service: Neurosurgery;  Laterality: Left;  LEFT CARPAL TUNNEL RELEASE WITH ULTRASOUND GUIDANCE, POSSIBLE CONVERT TO OPEN   CESAREAN SECTION     COLONOSCOPY WITH PROPOFOL  N/A 03/27/2017   Procedure: COLONOSCOPY WITH PROPOFOL ;  Surgeon: Therisa Bi, MD;  Location: [facility] ENDOSCOPY;  Service: Gastroenterology;  Laterality: N/A;   COLONOSCOPY WITH PROPOFOL  N/A  04/04/2022   Procedure: COLONOSCOPY WITH PROPOFOL ;  Surgeon: Therisa Bi, MD;  Location: [facility] ENDOSCOPY;  Service: Gastroenterology;  Laterality: N/A;   ESOPHAGOGASTRODUODENOSCOPY     ESOPHAGOGASTRODUODENOSCOPY (EGD) WITH PROPOFOL  N/A 03/27/2017   Procedure: ESOPHAGOGASTRODUODENOSCOPY (EGD) WITH PROPOFOL  WITH DILATION;  Surgeon: Therisa Bi, MD;  Location: [facility] ENDOSCOPY;  Service: Gastroenterology;  Laterality: N/A;   HERNIA REPAIR     Had it done twice   PORT-A-CATH REMOVAL     PORTACATH PLACEMENT N/A 02/15/2019   Procedure: INSERTION PORT-A-CATH WITH POSSIBLE ULTRASOUND;  Surgeon: Curvin Deward MOULD, MD;  Location: [facility];  Service: General;  Laterality: N/A;   POSTERIOR CERVICAL FUSION/FORAMINOTOMY N/A 07/29/2022   Procedure: C2-T2 POSTERIOR SPINAL FUSION;  Surgeon: Clois Fret, MD;  Location: [facility];  Service: Neurosurgery;  Laterality: N/A;   RE-EXCISION OF BREAST CANCER,SUPERIOR MARGINS Left 02/15/2019   Procedure: RE-EXCISION OF LEFT BREAST CANCER ANTERIOR MARGINS;  Surgeon: Curvin Deward MOULD, MD;  Location: [facility];  Service: General;  Laterality: Left;   THROAT SURGERY  2015   UMBILICAL HERNIA REPAIR  2006    x 2   w mesh per pt    Family History  Problem Relation Age of Onset   Breast cancer Mother        dx late 53s   Thyroid  disease Mother    Colon polyps Mother    Cancer Mother    Varicose Veins Mother    Alzheimer's disease Mother    Prostate cancer Father        dx 60   Depression Father    Breast cancer Maternal Grandmother        dx late 62s   Osteoporosis Maternal Grandmother    Arthritis Maternal Grandmother    Cancer Maternal Grandmother    Cancer Maternal Uncle        possibly, unsure type     Social Connections: Socially Isolated (10/26/2023)   Social Connection and Isolation Panel    Frequency of Communication with Friends and Family: Once a week    Frequency of Social Gatherings with Friends and Family: Never    Attends Religious Services:  Never    Database Administrator or Organizations: No    Attends Engineer, Structural: Not on file    Marital Status: Divorced      Current Outpatient Medications:    acetaminophen  (TYLENOL ) 500 MG tablet, Take 500-1,000 mg by mouth every 6 (six) hours as needed (for pain.)., Disp: , Rfl:    albuterol  (VENTOLIN  HFA) 108 (90 Base) MCG/ACT inhaler, Inhale 2 puffs into the lungs every 4 (four) hours as needed for wheezing or shortness of breath., Disp: 54 g, Rfl: 11   anastrozole  (ARIMIDEX ) 1 MG tablet, Take 1 tablet (1 mg total) by mouth daily., Disp: 90 tablet, Rfl: 2   budesonide -formoterol  (SYMBICORT ) 160-4.5 MCG/ACT inhaler, Inhale 2 puffs into the lungs in the morning and at bedtime. Please schedule office visit before any future refills., Disp: 10.2  g, Rfl: 12   buPROPion  (WELLBUTRIN  XL) 300 MG 24 hr tablet, Take 1 tablet (300 mg total) by mouth daily., Disp: 90 tablet, Rfl: 3   [START ON 03/11/2024] cetirizine  (ZYRTEC ) 10 MG tablet, Take 1 tablet (10 mg total) by mouth daily as needed for allergies., Disp: 90 tablet, Rfl: 1   Cholecalciferol  (VITAMIN D -3) 125 MCG (5000 UT) TABS, Take 5,000 Units by mouth in the morning., Disp: , Rfl:    Cyanocobalamin  (VITAMIN B-12 PO), Take 1,000 mcg by mouth in the morning., Disp: , Rfl:    fluticasone  (FLONASE ) 50 MCG/ACT nasal spray, Place 1 spray into both nostrils daily., Disp: 18.2 mL, Rfl: 3   gabapentin  (NEURONTIN ) 300 MG capsule, Take 1 capsule (300 mg total) by mouth at bedtime., Disp: 30 capsule, Rfl: 3   gabapentin  (NEURONTIN ) 600 MG tablet, TAKE 1 TABLET BY MOUTH THREE TIMES A DAY, Disp: 90 tablet, Rfl: 3   omeprazole  (PRILOSEC) 40 MG capsule, Take it in empty stomach 30 min before evening meal once a day, Disp: 90 capsule, Rfl: 3   PARoxetine  (PAXIL ) 10 MG tablet, TAKE 1 TABLET BY MOUTH EVERYDAY AT BEDTIME, Disp: 90 tablet, Rfl: 1   rosuvastatin (CRESTOR) 5 MG tablet, Take 1 tablet (5 mg total) by mouth every other day., Disp: 45  tablet, Rfl: 3   Spacer/Aero-Holding Chambers (AEROCHAMBER MV) inhaler, Use as instructed, Disp: 1 each, Rfl: 0   traMADol  (ULTRAM ) 50 MG tablet, Take 1 tablet (50 mg total) by mouth every 6 (six) hours as needed., Disp: 20 tablet, Rfl: 0   traZODone  (DESYREL ) 50 MG tablet, Take 1 tablet (50 mg total) by mouth at bedtime as needed for sleep., Disp: 90 tablet, Rfl: 3   valACYclovir  (VALTREX ) 1000 MG tablet, Take 1 tablet (1,000 mg total) by mouth 2 (two) times daily. For outbreak, then continue one tablet daily for suppression, Disp: 104 tablet, Rfl: 3   Physical Exam:   BP 125/78   Pulse 88   Temp 97.9 F (36.6 C)   Ht 5' 3 (1.6 m)   Wt 176 lb 3.2 oz (79.9 kg)   SpO2 96%   BMI 31.21 kg/m   Salient findings:  CN II-XII intact Bilateral EAC clear and TM intact with well pneumatized middle ear spaces Anterior rhinoscopy: Septum intact; bilateral inferior turbinates with modest hypertrophy No lesions of oral cavity/oropharynx No obviously palpable neck masses/lymphadenopathy/thyromegaly -- left neck scar (ACDF) well healed;  No respiratory distress or stridor. TFL was indicated to better evaluate the proximal airway, given the patient's history and exam findings, and is detailed below. Tongue friedman 3; tonsils 1/1.   Seprately Identifiable Procedures:  Prior to initiating any procedures, risks/benefits/alternatives were explained to the patient and verbal consent obtained. Procedure Note Pre-procedure diagnosis:  Obstructive sleep apnea, rule out structural cause Post-procedure diagnosis: Same Procedure: Transnasal Fiberoptic Laryngoscopy, CPT 31575 - Mod 25 Indication: see above Complications: None apparent EBL: 0 mL  The procedure was undertaken to further evaluate the patient's complaint above, with mirror exam inadequate for appropriate examination due to gag reflex and poor patient tolerance  Procedure:  Patient was identified as correct patient. Verbal consent was  obtained. The nose was sprayed with oxymetazoline and 4% lidocaine . The The flexible laryngoscope was passed through the LEFT side of nose to view the nasal cavity, pharynx (oropharynx, hypopharynx) and larynx.  The larynx was examined at rest and during multiple phonatory tasks. Documentation was obtained and reviewed with patient. The scope was removed. The patient  tolerated the procedure well.  Findings: The left nasal cavity and nasopharynx did not reveal any masses or lesions, mucosa appeared to be without obvious lesions. The tongue base, pharyngeal walls, piriform sinuses, vallecula, epiglottis and postcricoid region are normal in appearance; Muller maneuver negative; The visualized portion of the subglottis and proximal trachea is widely patent. The vocal folds are mobile bilaterally. There are no lesions on the free edge of the vocal folds nor elsewhere in the larynx worrisome for malignancy.    Electronically signed by: Eldora KATHEE Blanch, MD 01/18/2024 10:20 AM   Impression & Plans:  Leah [name] is a 58 y.o. female with:  1. OSA (obstructive sleep apnea)   2. Intolerance of continuous positive airway pressure (CPAP) ventilation    We discussed options including continued CPAP v/s Inspire. She cannot tolerate CPAP despite trial so would like to proceed with process of hypoglossal nerve stimulator placement  We had a discussion regarding risks of Inspire including lack of benefit, persistent symptoms, pneumothorax, tongue soreness or weakness, Floor of mouth numbness, injury to major vessels, hematoma, implant infection, bleeding, scarring, tethering of neck, persistent symptoms, need for further procedures, and risk of anesthesia among others.   We discussed complicating factors I.e. insomnia but this is treated so would like to proceed with DISE  Schedule for DISE  See below regarding exact medications prescribed this encounter including dosages and route: No orders of the defined types  were placed in this encounter.     Thank you for allowing me the opportunity to care for your patient. Please do not hesitate to contact me should you have any other questions.  Sincerely, Eldora Blanch, MD Otolaryngologist (ENT), [facility] Phone: [phone] Fax: [phone]  01/18/2024, 10:20 AM   MDM:  Level 4: 99204 Complexity/Problems addressed: Data complexity: mod - independent interpretation of outside test - Morbidity: mod - decision for surgery  - Prescription Drug prescribed or managed: n

## 2024-01-24 ENCOUNTER — Other Ambulatory Visit: Payer: Self-pay

## 2024-01-24 ENCOUNTER — Encounter (HOSPITAL_BASED_OUTPATIENT_CLINIC_OR_DEPARTMENT_OTHER): Payer: Self-pay | Admitting: *Deleted

## 2024-01-24 NOTE — Progress Notes (Signed)
   01/24/24 1018  PAT Phone Screen  Is the patient taking a GLP-1 receptor agonist? No  Do You Have Diabetes? No  Do You Have Hypertension? No  Have You Ever Been to the ER for Asthma? No  Have You Taken Oral Steroids in the Past 3 Months? No  Do you Take Phenteramine or any Other Diet Drugs? No  Recent  Lab Work, EKG, CXR? Yes  Where was this test performed? 10-27-23 EKG  Do you have a history of heart problems? (S)  No (2020 ECHO EF 50-55% done for chemo)  Any Recent Hospitalizations? No  Height 5' 3 (1.6 m)  Weight 79.9 kg  Pat Appointment Scheduled No  Reason for No Appointment Not Needed

## 2024-01-29 ENCOUNTER — Encounter (HOSPITAL_BASED_OUTPATIENT_CLINIC_OR_DEPARTMENT_OTHER)
Admission: RE | Disposition: A | Discharge status: Home / Self Care | Payer: Self-pay | Source: Home / Self Care | Attending: Otolaryngology

## 2024-01-29 ENCOUNTER — Other Ambulatory Visit: Payer: Self-pay

## 2024-01-29 ENCOUNTER — Ambulatory Visit (HOSPITAL_BASED_OUTPATIENT_CLINIC_OR_DEPARTMENT_OTHER): Admitting: Anesthesiology

## 2024-01-29 ENCOUNTER — Encounter (HOSPITAL_BASED_OUTPATIENT_CLINIC_OR_DEPARTMENT_OTHER): Payer: Self-pay

## 2024-01-29 ENCOUNTER — Ambulatory Visit (HOSPITAL_BASED_OUTPATIENT_CLINIC_OR_DEPARTMENT_OTHER)
Admission: RE | Admit: 2024-01-29 | Discharge: 2024-01-29 | Disposition: A | Discharge status: Home / Self Care | Attending: Otolaryngology | Admitting: Otolaryngology

## 2024-01-29 ENCOUNTER — Telehealth (INDEPENDENT_AMBULATORY_CARE_PROVIDER_SITE_OTHER): Payer: Self-pay | Admitting: Otolaryngology

## 2024-01-29 DIAGNOSIS — M1990 Unspecified osteoarthritis, unspecified site: Secondary | ICD-10-CM | POA: Diagnosis not present

## 2024-01-29 DIAGNOSIS — F418 Other specified anxiety disorders: Secondary | ICD-10-CM | POA: Diagnosis not present

## 2024-01-29 DIAGNOSIS — G4733 Obstructive sleep apnea (adult) (pediatric): Secondary | ICD-10-CM | POA: Diagnosis not present

## 2024-01-29 DIAGNOSIS — K219 Gastro-esophageal reflux disease without esophagitis: Secondary | ICD-10-CM | POA: Diagnosis not present

## 2024-01-29 DIAGNOSIS — Z789 Other specified health status: Secondary | ICD-10-CM | POA: Diagnosis not present

## 2024-01-29 DIAGNOSIS — Z6831 Body mass index (BMI) 31.0-31.9, adult: Secondary | ICD-10-CM | POA: Insufficient documentation

## 2024-01-29 DIAGNOSIS — E669 Obesity, unspecified: Secondary | ICD-10-CM | POA: Diagnosis not present

## 2024-01-29 DIAGNOSIS — Z87891 Personal history of nicotine dependence: Secondary | ICD-10-CM | POA: Insufficient documentation

## 2024-01-29 DIAGNOSIS — G4730 Sleep apnea, unspecified: Secondary | ICD-10-CM | POA: Diagnosis not present

## 2024-01-29 DIAGNOSIS — M810 Age-related osteoporosis without current pathological fracture: Secondary | ICD-10-CM | POA: Diagnosis not present

## 2024-01-29 DIAGNOSIS — Z01818 Encounter for other preprocedural examination: Secondary | ICD-10-CM

## 2024-01-29 DIAGNOSIS — J45909 Unspecified asthma, uncomplicated: Secondary | ICD-10-CM | POA: Insufficient documentation

## 2024-01-29 HISTORY — DX: Sleep apnea, unspecified: G47.30

## 2024-01-29 HISTORY — PX: DRUG INDUCED ENDOSCOPY: SHX6808

## 2024-01-29 SURGERY — DRUG INDUCED SLEEP ENDOSCOPY
Anesthesia: General

## 2024-01-29 MED ORDER — LIDOCAINE 2% (20 MG/ML) 5 ML SYRINGE
INTRAMUSCULAR | Status: AC
Start: 2024-01-29 — End: 2024-01-29
  Filled 2024-01-29: qty 5

## 2024-01-29 MED ORDER — MIDAZOLAM HCL 2 MG/2ML IJ SOLN
INTRAMUSCULAR | Status: AC
  Filled 2024-01-29: qty 2

## 2024-01-29 MED ORDER — ONDANSETRON HCL 4 MG/2ML IJ SOLN: 4.0000 mg | Freq: Once | INTRAMUSCULAR | Status: DC | PRN

## 2024-01-29 MED ORDER — LACTATED RINGERS IV SOLN: INTRAVENOUS | Status: DC

## 2024-01-29 MED ORDER — PROPOFOL 10 MG/ML IV BOLUS
INTRAVENOUS | Status: AC
  Filled 2024-01-29: qty 20

## 2024-01-29 MED ORDER — OXYMETAZOLINE HCL 0.05 % NA SOLN
NASAL | Status: DC | PRN
  Administered 2024-01-29: 1 via TOPICAL

## 2024-01-29 MED ORDER — ONDANSETRON HCL 4 MG/2ML IJ SOLN
INTRAMUSCULAR | Status: AC
  Filled 2024-01-29: qty 2

## 2024-01-29 MED ORDER — OXYMETAZOLINE HCL 0.05 % NA SOLN
NASAL | Status: AC
  Filled 2024-01-29: qty 30

## 2024-01-29 MED ORDER — ONDANSETRON HCL 4 MG/2ML IJ SOLN
INTRAMUSCULAR | Status: DC | PRN
  Administered 2024-01-29: 4 mg via INTRAVENOUS

## 2024-01-29 MED ORDER — FENTANYL CITRATE (PF) 100 MCG/2ML IJ SOLN: 25.0000 ug | INTRAMUSCULAR | Status: DC | PRN

## 2024-01-29 MED ORDER — FENTANYL CITRATE (PF) 100 MCG/2ML IJ SOLN
INTRAMUSCULAR | Status: AC
  Filled 2024-01-29: qty 2

## 2024-01-29 MED ORDER — LIDOCAINE-EPINEPHRINE 1 %-1:100000 IJ SOLN
INTRAMUSCULAR | Status: AC
  Filled 2024-01-29: qty 1

## 2024-01-29 MED ORDER — MIDAZOLAM HCL 5 MG/5ML IJ SOLN
INTRAMUSCULAR | Status: DC | PRN
  Administered 2024-01-29: 2 mg via INTRAVENOUS

## 2024-01-29 MED ORDER — PROPOFOL 500 MG/50ML IV EMUL
INTRAVENOUS | Status: AC
  Filled 2024-01-29: qty 50

## 2024-01-29 MED ORDER — PROPOFOL 500 MG/50ML IV EMUL
INTRAVENOUS | Status: DC | PRN
  Administered 2024-01-29: 50 ug/kg/min via INTRAVENOUS

## 2024-01-29 SURGICAL SUPPLY — 11 items
CANISTER SUCT 1200ML W/VALVE (MISCELLANEOUS) ×1 IMPLANT
GLOVE BIO SURGEON STRL SZ 6.5 (GLOVE) ×1 IMPLANT
KIT CLEAN ENDO (MISCELLANEOUS) ×1 IMPLANT
NDL PRECISIONGLIDE 27X1.5 (NEEDLE) IMPLANT
NEEDLE PRECISIONGLIDE 27X1.5 (NEEDLE) IMPLANT
PATTIES SURGICAL .5 X3 (DISPOSABLE) ×1 IMPLANT
SHEET MEDIUM DRAPE 40X70 STRL (DRAPES) ×1 IMPLANT
SOLUTION ANTFG W/FOAM PAD STRL (MISCELLANEOUS) ×1 IMPLANT
SYR CONTROL 10ML LL (SYRINGE) IMPLANT
TOWEL GREEN STERILE FF (TOWEL DISPOSABLE) ×1 IMPLANT
TUBE CONNECTING 20X1/4 (TUBING) IMPLANT

## 2024-01-29 NOTE — Anesthesia Postprocedure Evaluation (Signed)
 Anesthesia Post Note  Patient: Leah [name]  Procedure(s) Performed: DRUG INDUCED SLEEP ENDOSCOPY     Patient location during evaluation: PACU Anesthesia Type: General Level of consciousness: awake and alert Pain management: pain level controlled Vital Signs Assessment: post-procedure vital signs reviewed and stable Respiratory status: spontaneous breathing, nonlabored ventilation and respiratory function stable Cardiovascular status: blood pressure returned to baseline and stable Postop Assessment: no apparent nausea or vomiting Anesthetic complications: no   No notable events documented.  Last Vitals:  Vitals:   01/29/24 0800 01/29/24 0818  BP: 112/65 (!) 120/95  Pulse: 64 (!) 103  Resp: 12 20  Temp:  36.9 C  SpO2: 97% 96%    Last Pain:  Vitals:   01/29/24 0818  TempSrc: Temporal  PainSc: 0-No pain                 Garnette FORBES Skillern

## 2024-01-29 NOTE — Anesthesia Preprocedure Evaluation (Addendum)
 Anesthesia Evaluation  Patient identified by MRN, date of birth, ID band Patient awake    Reviewed: Allergy & Precautions, NPO status , Patient's Chart, lab work & pertinent test results  Airway Mallampati: II  TM Distance: >3 FB Neck ROM: Full    Dental  (+) Dental Advisory Given, Missing   Pulmonary asthma , sleep apnea , former smoker   Pulmonary exam normal breath sounds clear to auscultation       Cardiovascular negative cardio ROS Normal cardiovascular exam Rhythm:Regular Rate:Normal     Neuro/Psych  Headaches PSYCHIATRIC DISORDERS Anxiety Depression     Neuromuscular disease    GI/Hepatic Neg liver ROS,GERD  Medicated,,  Endo/Other  Obesity   Renal/GU negative Renal ROS     Musculoskeletal  (+) Arthritis ,    Abdominal   Peds  Hematology negative hematology ROS (+)   Anesthesia Other Findings Day of surgery medications reviewed with the patient.  History of left breast cancer  Reproductive/Obstetrics                              Anesthesia Physical Anesthesia Plan  ASA: 2  Anesthesia Plan: General   Post-op Pain Management: Minimal or no pain anticipated   Induction: Intravenous  PONV Risk Score and Plan: 3 and TIVA and Treatment may vary due to age or medical condition  Airway Management Planned: Simple Face Mask  Additional Equipment:   Intra-op Plan:   Post-operative Plan:   Informed Consent: I have reviewed the patients History and Physical, chart, labs and discussed the procedure including the risks, benefits and alternatives for the proposed anesthesia with the patient or authorized representative who has indicated his/her understanding and acceptance.     Dental advisory given  Plan Discussed with: CRNA  Anesthesia Plan Comments:          Anesthesia Quick Evaluation

## 2024-01-29 NOTE — Transfer of Care (Signed)
 Immediate Anesthesia Transfer of Care Note  Patient: Leah [name]  Procedure(s) Performed: DRUG INDUCED SLEEP ENDOSCOPY  Patient Location: PACU  Anesthesia Type:MAC  Level of Consciousness: awake, alert , and oriented  Airway & Oxygen Therapy: Patient Spontanous Breathing and Patient connected to nasal cannula oxygen  Post-op Assessment: Report given to RN and Post -op Vital signs reviewed and stable  Post vital signs: Reviewed and stable  Last Vitals:  Vitals Value Taken Time  BP 107/63 01/29/24 07:45  Temp    Pulse 67 01/29/24 07:47  Resp 10 01/29/24 07:47  SpO2 98 % 01/29/24 07:47  Vitals shown include unfiled device data.  Last Pain:  Vitals:   01/29/24 0642  TempSrc: Tympanic  PainSc: 0-No pain      Patients Stated Pain Goal: 7 (01/29/24 9357)  Complications: No notable events documented.

## 2024-01-29 NOTE — Telephone Encounter (Signed)
 Passed DISE, posted for inspire.

## 2024-01-29 NOTE — Op Note (Signed)
 Otolaryngology Operative note  Leah [name] Date/Time of Admission: 01/29/2024  6:21 AM  CSN: [id number];MRN:[id number]  DOB: February 08, 1966 Age: 58 y.o. Location: [facility]    Pre-Op Diagnosis: MODERATE TO SEVERE OBSTRUCTIVE SLEEP APNEA INTOLERANCE OF CONTINUOUS POSITIVE AIRWAY PRESSURE VENTILATION   Post-Op Diagnosis: Same   Procedure: Procedure(s): DRUG INDUCED SLEEP ENDOSCOPY USING ENID BREWSTER- CPT [id number]  Surgeon: Eldora Blanch, MD  Anesthesia type:  MAC  Anesthesiologist: See log  Staff: Circulator: Wyn Greig NOVAK, RN Scrub Person: BurroughsDagoberto MATSU, RN  Implants: * No implants in log *  Specimens: None  EBL: minimal  Drains: None  Post-op disposition and condition: PACU, hemodynamically stable  Findings: There was no evidence of complete concentric palatal obstruction and she is a candidate anatomically for hypoglossal nerve stimulation therapy.  Long palate  Complications: None apparent  Indications and consent:  Leah [name] is a 58 y.o. female with moderate to severe obstructive sleep apnea with intolerance of continuous positive airway pressure. As such, with a BMI of 31, patient's options were discussed including Hypoglossal nerve stimulator placement. Risks/benefits/alternatives for each option were discussed. Patient expressed understanding, and despite these risks, consented and decided to proceed with drug induced sleep endoscopy to determine stimulator placement candidacy. Informed consent was signed before proceeding.  Procedure: The patient was brought to the endoscopy room and was anesthetized via the standard drug-induced sleep endoscopy protocol using propofol  pump. The room lights were dimmed. The propofol  infusion rate was started at 50 mcg and gradually increased at which point, conditions that mimic sleep were gradually observed.   With the patient not responsive to verbal commands, but still with spontaneous  respiration, sleep disordered breathing events were clearly observed including snoring   Under these conditions, the flexible endoscope was inserted to examine both sides of the nose as well as the pharynx and larynx. 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 Palate was quite long.. With simulated jaw thrust, the hypopharyngeal obstruction and secondarily the palatal collapse improved.   In summary, there was no evidence of complete concentric palatal obstruction and she is a candidate anatomically for hypoglossal nerve stimulation therapy.   The anesthesia was then weaned and care transferred to anesthesia who transported patient to PACU in stable condition.   I was present for and performed the entire procedure.   Shery [place]un[place] B Billiejo Sorto

## 2024-01-29 NOTE — H&P (Signed)
 Pre-Operative H&P - Day Of Surgery Patient Name: Leah [name] Date:   01/29/2024  HPI: Leah [name] is a 58 y.o. female who presents today for operative treatment of obstructive sleep apnea, intolerance of continuous positive airway pressure ventilation. Patient denies recent significant changes to health or significant new medications or physiologic change in condition which would immediately impact plans. No new types of therapy has been initiated that would change the plan or the appropriateness of the plan.   ROS:  A complete review of systems was obtained and is otherwise negative.   PMH:  Past Medical History:  Diagnosis Date   Allergy 1970   When i was little   Anxiety    Arthritis    Breast cancer (HCC)    left [facility]   Carpal tunnel syndrome of right wrist    Childhood asthma    Colon polyps    Depression    Dyspnea    Eosinophilic esophagitis    noted in [place]  with GI path report 09/29/14    Esophageal stricture    s/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 Wadie Delaine    Family history of breast cancer    Family history of prostate cancer    Gastroesophageal reflux disease without esophagitis 04/09/2022   Herniated disc, cervical    s/p epidural injection   Herpes    History of kidney stones    Hyperlipidemia    Low back pain    Migraines    Osteoporosis Aug 2021   After chemo and radiation   Personal history of chemotherapy    Personal history of radiation therapy    Pneumonia    Sleep apnea    no CPAP   Tinea corporis 10/03/2017    PSH:  Past Surgical History:  Procedure Laterality Date   ANTERIOR CERVICAL DECOMP/DISCECTOMY FUSION N/A 07/29/2022   Procedure: C5-T1 ANTERIOR CERVICAL DISCECTOMY AND FUSION;  Surgeon: Clois Fret, MD;  Location: [facility] ORS;  Service: Neurosurgery;  Laterality: N/A;   APPLICATION OF INTRAOPERATIVE CT SCAN N/A 07/29/2022   Procedure: APPLICATION OF INTRAOPERATIVE CT SCAN;  Surgeon: Clois Fret, MD;   Location: [facility] ORS;  Service: Neurosurgery;  Laterality: N/A;   BREAST BIOPSY Left 12/25/2018   US  BX, invasive mammary carcinoma    BREAST LUMPECTOMY     BREAST LUMPECTOMY WITH NEEDLE LOCALIZATION AND AXILLARY SENTINEL LYMPH NODE BX Left 01/16/2019   Procedure: LEFT BREAST LUMPECTOMY WITH NEEDLE LOCALIZATION AND SENTINEL LYMPH NODE BIOPSY;  Surgeon: Curvin Deward MOULD, MD;  Location: [facility] ORS;  Service: General;  Laterality: Left;   CARPAL TUNNEL RELEASE Left 10/26/2023   Procedure: CARPAL TUNNEL RELEASE;  Surgeon: Claudene Penne ORN, MD;  Location: [facility] ORS;  Service: Neurosurgery;  Laterality: Left;  LEFT CARPAL TUNNEL RELEASE WITH ULTRASOUND GUIDANCE, POSSIBLE CONVERT TO OPEN   CESAREAN SECTION     COLONOSCOPY WITH PROPOFOL  N/A 03/27/2017   Procedure: COLONOSCOPY WITH PROPOFOL ;  Surgeon: Therisa Bi, MD;  Location: [facility] ENDOSCOPY;  Service: Gastroenterology;  Laterality: N/A;   COLONOSCOPY WITH PROPOFOL  N/A 04/04/2022   Procedure: COLONOSCOPY WITH PROPOFOL ;  Surgeon: Therisa Bi, MD;  Location: [facility] ENDOSCOPY;  Service: Gastroenterology;  Laterality: N/A;   ESOPHAGOGASTRODUODENOSCOPY     ESOPHAGOGASTRODUODENOSCOPY (EGD) WITH PROPOFOL  N/A 03/27/2017   Procedure: ESOPHAGOGASTRODUODENOSCOPY (EGD) WITH PROPOFOL  WITH DILATION;  Surgeon: Therisa Bi, MD;  Location: [facility] ENDOSCOPY;  Service: Gastroenterology;  Laterality: N/A;   HERNIA REPAIR     Had it done twice   PORT-A-CATH REMOVAL  PORTACATH PLACEMENT N/A 02/15/2019   Procedure: INSERTION PORT-A-CATH WITH POSSIBLE ULTRASOUND;  Surgeon: Curvin Deward MOULD, MD;  Location: [facility] ORS;  Service: General;  Laterality: N/A;   POSTERIOR CERVICAL FUSION/FORAMINOTOMY N/A 07/29/2022   Procedure: C2-T2 POSTERIOR SPINAL FUSION;  Surgeon: Clois Fret, MD;  Location: [facility] ORS;  Service: Neurosurgery;  Laterality: N/A;   RE-EXCISION OF BREAST CANCER,SUPERIOR MARGINS Left 02/15/2019   Procedure: RE-EXCISION OF LEFT BREAST CANCER ANTERIOR MARGINS;  Surgeon:  Curvin Deward MOULD, MD;  Location: [facility] ORS;  Service: General;  Laterality: Left;   THROAT SURGERY  2015   UMBILICAL HERNIA REPAIR  2006    x 2   w mesh per pt    MEDS:   Current Facility-Administered Medications:    lactated ringers  infusion, , Intravenous, Continuous, Hodierne, Adam, MD  ALLERGIES: Hydrocodone -acetaminophen , Lactose intolerance (gi), Cephalexin , Penicillins, and Sulfa antibiotics  EXAM: Vitals: BP 119/64   Pulse 63   Temp 97.8 F (36.6 C) (Tympanic)   Resp 18   Ht 5' 3 (1.6 m)   Wt 80.1 kg   SpO2 98%   BMI 31.28 kg/m   General Awake, at baseline alertness.   HEENT No scleral icterus or conjunctival hemorrhage. Globe position appears normal. External ears  normal. Nose patent without rhinorrhea. No lymphadenopathy. No thyromegaly  Cardiovascular No cyanosis.  Pulmonary No audible stridor. Breathing easily with no labor.  Neuro Symmetric facial movement.   Psychiatry Appropriate affect and mood.  Skin No scars or lesions on face or neck.  Extermities Moves all extremities with normal range of motion.   Other Findings None.   Assessment & Plan: Leah [name] has diagnoses of obstructive sleep apnea, intolerance of continuous positive airway pressure ventilation and will go to the OR today for drug induced sleep endoscopy. Informed consent was obtained and available in EMR today. All questions have been answered, and risks/benefits/alternatives of procedure as noted in the consent were discussed in a quiet area. Questions were invited and answered. The patient expressed understanding, provided consent and wished to proceed despite risks.  Leah [name] 01/29/2024 7:07 AM

## 2024-01-29 NOTE — Anesthesia Procedure Notes (Signed)
 Procedure Name: MAC Date/Time: 01/29/2024 7:32 AM  Performed by: Pam Macario BROCKS, CRNAPre-anesthesia Checklist: Timeout performed, Patient being monitored, Suction available, Emergency Drugs available and Patient identified Patient Re-evaluated:Patient Re-evaluated prior to induction Oxygen Delivery Method: Nasal cannula Preoxygenation: Pre-oxygenation with 100% oxygen Induction Type: IV induction Placement Confirmation: breath sounds checked- equal and bilateral, CO2 detector and positive ETCO2 Comments: Adjusted [place] 02 from nasal to oral per surgeon for procedure

## 2024-01-29 NOTE — Discharge Instructions (Addendum)
 You did pass your DISE - which means you are a candidate for the implant. Please let us  know if you do not hear back from us  in a couple of weeks about insurance approval    Post Anesthesia Home Care Instructions  Activity: Get plenty of rest for the remainder of the day. A responsible individual must stay with you for 24 hours following the procedure.  For the next 24 hours, DO NOT: -Drive a car -Advertising copywriter -Drink alcoholic beverages -Take any medication unless instructed by your physician -Make any legal decisions or sign important papers.  Meals: Start with liquid foods such as gelatin or soup. Progress to regular foods as tolerated. Avoid greasy, spicy, heavy foods. If nausea and/or vomiting occur, drink only clear liquids until the nausea and/or vomiting subsides. Call your physician if vomiting continues.  Special Instructions/Symptoms: Your throat may feel dry or sore from the anesthesia or the breathing tube placed in your throat during surgery. If this causes discomfort, gargle with warm salt water . The discomfort should disappear within 24 hours.  If you had a scopolamine patch placed behind your ear for the management of post- operative nausea and/or vomiting:  1. The medication in the patch is effective for 72 hours, after which it should be removed.  Wrap patch in a tissue and discard in the trash. Wash hands thoroughly with soap and water . 2. You may remove the patch earlier than 72 hours if you experience unpleasant side effects which may include dry mouth, dizziness or visual disturbances. 3. Avoid touching the patch. Wash your hands with soap and water  after contact with the patch.

## 2024-01-30 ENCOUNTER — Encounter (HOSPITAL_BASED_OUTPATIENT_CLINIC_OR_DEPARTMENT_OTHER): Payer: Self-pay | Admitting: Otolaryngology

## 2024-02-25 ENCOUNTER — Other Ambulatory Visit: Payer: Self-pay | Admitting: Oncology

## 2024-02-26 ENCOUNTER — Encounter: Payer: Self-pay | Admitting: Oncology

## 2024-03-01 ENCOUNTER — Other Ambulatory Visit: Payer: Self-pay

## 2024-03-01 ENCOUNTER — Encounter (HOSPITAL_BASED_OUTPATIENT_CLINIC_OR_DEPARTMENT_OTHER): Payer: Self-pay | Admitting: *Deleted

## 2024-03-13 ENCOUNTER — Encounter (HOSPITAL_BASED_OUTPATIENT_CLINIC_OR_DEPARTMENT_OTHER)
Admission: RE | Disposition: A | Discharge status: Home / Self Care | Payer: Self-pay | Source: Home / Self Care | Attending: Otolaryngology

## 2024-03-13 ENCOUNTER — Ambulatory Visit (HOSPITAL_BASED_OUTPATIENT_CLINIC_OR_DEPARTMENT_OTHER): Admitting: Certified Registered Nurse Anesthetist

## 2024-03-13 ENCOUNTER — Other Ambulatory Visit: Payer: Self-pay

## 2024-03-13 ENCOUNTER — Ambulatory Visit (HOSPITAL_BASED_OUTPATIENT_CLINIC_OR_DEPARTMENT_OTHER)
Admission: RE | Admit: 2024-03-13 | Discharge: 2024-03-13 | Disposition: A | Discharge status: Home / Self Care | Source: Home / Self Care | Attending: Otolaryngology | Admitting: Otolaryngology

## 2024-03-13 ENCOUNTER — Encounter (HOSPITAL_BASED_OUTPATIENT_CLINIC_OR_DEPARTMENT_OTHER): Payer: Self-pay

## 2024-03-13 ENCOUNTER — Ambulatory Visit (HOSPITAL_COMMUNITY)

## 2024-03-13 DIAGNOSIS — M810 Age-related osteoporosis without current pathological fracture: Secondary | ICD-10-CM | POA: Insufficient documentation

## 2024-03-13 DIAGNOSIS — G4733 Obstructive sleep apnea (adult) (pediatric): Secondary | ICD-10-CM | POA: Diagnosis not present

## 2024-03-13 HISTORY — PX: IMPLANTATION OF HYPOGLOSSAL NERVE STIMULATOR: SHX6827

## 2024-03-13 SURGERY — INSERTION, HYPOGLOSSAL NERVE STIMULATOR
Anesthesia: General | Site: Chest

## 2024-03-13 MED ORDER — PROPOFOL 10 MG/ML IV BOLUS
INTRAVENOUS | Status: DC | PRN
  Administered 2024-03-13: 180 mg via INTRAVENOUS

## 2024-03-13 MED ORDER — OXYCODONE HCL 5 MG PO TABS: 5.0000 mg | ORAL_TABLET | Freq: Once | ORAL | Status: DC | PRN

## 2024-03-13 MED ORDER — OXYCODONE HCL 5 MG/5ML PO SOLN: 5.0000 mg | Freq: Once | ORAL | Status: DC | PRN

## 2024-03-13 MED ORDER — FENTANYL CITRATE (PF) 100 MCG/2ML IJ SOLN: 25.0000 ug | INTRAMUSCULAR | Status: DC | PRN

## 2024-03-13 MED ORDER — LIDOCAINE 2% (20 MG/ML) 5 ML SYRINGE
INTRAMUSCULAR | Status: AC
  Filled 2024-03-13: qty 10

## 2024-03-13 MED ORDER — CLINDAMYCIN PHOSPHATE 900 MG/50ML IV SOLN
INTRAVENOUS | Status: AC
  Filled 2024-03-13: qty 50

## 2024-03-13 MED ORDER — DEXAMETHASONE SOD PHOSPHATE PF 10 MG/ML IJ SOLN
INTRAMUSCULAR | Status: DC | PRN
  Administered 2024-03-13: 10 mg via INTRAVENOUS

## 2024-03-13 MED ORDER — PHENYLEPHRINE HCL (PRESSORS) 10 MG/ML IV SOLN
INTRAVENOUS | Status: DC | PRN
  Administered 2024-03-13: 80 ug via INTRAVENOUS

## 2024-03-13 MED ORDER — MIDAZOLAM HCL (PF) 2 MG/2ML IJ SOLN
INTRAMUSCULAR | Status: DC | PRN
  Administered 2024-03-13: 2 mg via INTRAVENOUS

## 2024-03-13 MED ORDER — FENTANYL CITRATE (PF) 100 MCG/2ML IJ SOLN
INTRAMUSCULAR | Status: AC
  Filled 2024-03-13: qty 2

## 2024-03-13 MED ORDER — SUCCINYLCHOLINE 20MG/ML (10ML) SYRINGE FOR MEDFUSION PUMP - OPTIME
INTRAMUSCULAR | Status: DC | PRN
  Administered 2024-03-13: 120 mg via INTRAVENOUS

## 2024-03-13 MED ORDER — ACETAMINOPHEN 10 MG/ML IV SOLN
INTRAVENOUS | Status: DC | PRN
  Administered 2024-03-13: 1000 mg via INTRAVENOUS

## 2024-03-13 MED ORDER — ONDANSETRON HCL 4 MG/2ML IJ SOLN
INTRAMUSCULAR | Status: DC | PRN
  Administered 2024-03-13: 4 mg via INTRAVENOUS

## 2024-03-13 MED ORDER — FENTANYL CITRATE (PF) 100 MCG/2ML IJ SOLN
INTRAMUSCULAR | Status: DC | PRN
  Administered 2024-03-13: 100 ug via INTRAVENOUS

## 2024-03-13 MED ORDER — SUCCINYLCHOLINE CHLORIDE 200 MG/10ML IV SOSY
PREFILLED_SYRINGE | INTRAVENOUS | Status: AC
  Filled 2024-03-13: qty 20

## 2024-03-13 MED ORDER — ACETAMINOPHEN 10 MG/ML IV SOLN
INTRAVENOUS | Status: AC
  Filled 2024-03-13: qty 100

## 2024-03-13 MED ORDER — CLINDAMYCIN PHOSPHATE 900 MG/50ML IV SOLN
INTRAVENOUS | Status: DC | PRN
  Administered 2024-03-13: 900 mg via INTRAVENOUS

## 2024-03-13 MED ORDER — ALBUMIN HUMAN 5 % IV SOLN: INTRAVENOUS | Status: DC | PRN

## 2024-03-13 MED ORDER — LIDOCAINE-EPINEPHRINE 1 %-1:100000 IJ SOLN
INTRAMUSCULAR | Status: DC | PRN
  Administered 2024-03-13: 8 mL

## 2024-03-13 MED ORDER — PHENYLEPHRINE HCL-NACL 20-0.9 MG/250ML-% IV SOLN
INTRAVENOUS | Status: DC | PRN
  Administered 2024-03-13: 25 ug/min via INTRAVENOUS

## 2024-03-13 MED ORDER — PROPOFOL 500 MG/50ML IV EMUL
INTRAVENOUS | Status: DC | PRN
  Administered 2024-03-13: 100 ug/kg/min via INTRAVENOUS

## 2024-03-13 MED ORDER — LIDOCAINE 2% (20 MG/ML) 5 ML SYRINGE
INTRAMUSCULAR | Status: DC | PRN
  Administered 2024-03-13: 60 mg via INTRAVENOUS

## 2024-03-13 MED ORDER — LACTATED RINGERS IV SOLN: INTRAVENOUS | Status: DC

## 2024-03-13 MED ORDER — 0.9 % SODIUM CHLORIDE (POUR BTL) OPTIME
TOPICAL | Status: DC | PRN
  Administered 2024-03-13: 1000 mL

## 2024-03-13 MED ORDER — OXYCODONE HCL 5 MG PO TABS: 5.0000 mg | ORAL_TABLET | ORAL | 0 refills | Status: AC | PRN

## 2024-03-13 MED ORDER — CLINDAMYCIN HCL 300 MG PO CAPS: 300.0000 mg | ORAL_CAPSULE | Freq: Three times a day (TID) | ORAL | 0 refills | Status: AC

## 2024-03-13 MED ORDER — MIDAZOLAM HCL 2 MG/2ML IJ SOLN
INTRAMUSCULAR | Status: AC
  Filled 2024-03-13: qty 2

## 2024-03-13 MED ORDER — ACETAMINOPHEN 500 MG PO TABS: 1000.0000 mg | ORAL_TABLET | Freq: Four times a day (QID) | ORAL | 2 refills | Status: AC | PRN

## 2024-03-13 MED ORDER — SODIUM CHLORIDE 0.9 % IV SOLN
INTRAVENOUS | Status: AC
  Filled 2024-03-13: qty 10

## 2024-03-13 MED ORDER — ONDANSETRON HCL 4 MG/2ML IJ SOLN: 4.0000 mg | Freq: Four times a day (QID) | INTRAMUSCULAR | Status: DC | PRN

## 2024-03-13 SURGICAL SUPPLY — 60 items
BLADE CLIPPER SURG (BLADE) IMPLANT
BLADE SURG 15 STRL LF DISP TIS (BLADE) ×1 IMPLANT
CANISTER SUCT 1200ML W/VALVE (MISCELLANEOUS) ×1 IMPLANT
CHLORAPREP W/TINT 26 (MISCELLANEOUS) ×2 IMPLANT
CLIP TI WIDE RED SMALL 6 (CLIP) IMPLANT
CORD BIPOLAR FORCEPS 12FT (ELECTRODE) ×1 IMPLANT
COVER PROBE W GEL 5X96 (DRAPES) ×1 IMPLANT
DERMABOND ADVANCED .7 DNX12 (GAUZE/BANDAGES/DRESSINGS) ×2 IMPLANT
DRAPE C-ARM 35X43 STRL (DRAPES) ×1 IMPLANT
DRAPE HEAD BAR (DRAPES) IMPLANT
DRAPE INCISE IOBAN 66X45 STRL (DRAPES) ×1 IMPLANT
DRAPE UTILITY XL STRL (DRAPES) ×1 IMPLANT
DRSG TEGADERM 2-3/8X2-3/4 SM (GAUZE/BANDAGES/DRESSINGS) ×2 IMPLANT
DRSG TEGADERM 4X4.75 (GAUZE/BANDAGES/DRESSINGS) ×1 IMPLANT
ELECT COATED BLADE 2.86 ST (ELECTRODE) ×1 IMPLANT
ELECTRODE EMG 18 NIMS (NEUROSURGERY SUPPLIES) ×1 IMPLANT
ELECTRODE REM PT RTRN 9FT ADLT (ELECTROSURGICAL) ×1 IMPLANT
FORCEPS BIPOLAR SPETZLER 8 1.0 (NEUROSURGERY SUPPLIES) ×1 IMPLANT
GAUZE 4X4 16PLY ~~LOC~~+RFID DBL (SPONGE) ×1 IMPLANT
GAUZE SPONGE 4X4 12PLY STRL (GAUZE/BANDAGES/DRESSINGS) ×1 IMPLANT
GENERATOR PULSE INSPIRE V SENS (Generator) IMPLANT
GLOVE BIO SURGEON STRL SZ 6.5 (GLOVE) ×1 IMPLANT
GLOVE BIO SURGEON STRL SZ7.5 (GLOVE) ×1 IMPLANT
GOWN STRL REUS W/ TWL LRG LVL3 (GOWN DISPOSABLE) ×3 IMPLANT
HOOK RETRACT STAY BLUNT 12 (MISCELLANEOUS) IMPLANT
IV CATH 18G SAFETY (IV SOLUTION) ×1 IMPLANT
KIT NEURO ACCESSORY W/WRENCH (MISCELLANEOUS) IMPLANT
LEAD SLEEP STIM INSPIRE IV/V (Lead) ×1 IMPLANT
LOOP VASCLR MAXI BLUE 18IN ST (MISCELLANEOUS) ×1 IMPLANT
LOOP VESSEL MINI RED (MISCELLANEOUS) ×1 IMPLANT
MARKER SKIN DUAL TIP RULER LAB (MISCELLANEOUS) ×1 IMPLANT
NEEDLE HYPO 25X1 1.5 SAFETY (NEEDLE) ×1 IMPLANT
PACK BASIN DAY SURGERY FS (CUSTOM PROCEDURE TRAY) ×1 IMPLANT
PACK ENT DAY SURGERY (CUSTOM PROCEDURE TRAY) ×1 IMPLANT
PASSER CATH 36 CODMAN DISP (NEUROSURGERY SUPPLIES) IMPLANT
PASSER CATH 38CM DISP (INSTRUMENTS) IMPLANT
PENCIL SMOKE EVACUATOR (MISCELLANEOUS) ×1 IMPLANT
PROBE NERVE STIMULATOR (NEUROSURGERY SUPPLIES) ×1 IMPLANT
REMOTE CONTROL SLEEP INSPIRE (MISCELLANEOUS) ×1 IMPLANT
SET WALTER ACTIVATION W/DRAPE (SET/KITS/TRAYS/PACK) ×1 IMPLANT
SLEEVE SCD COMPRESS KNEE MED (STOCKING) ×1 IMPLANT
SOLN 0.9% NACL POUR BTL 1000ML (IV SOLUTION) ×1 IMPLANT
SPONGE INTESTINAL PEANUT (DISPOSABLE) ×1 IMPLANT
SUT MNCRL AB 4-0 PS2 18 (SUTURE) IMPLANT
SUT MON AB 5-0 PS2 18 (SUTURE) ×2 IMPLANT
SUT SILK 2 0 SH (SUTURE) IMPLANT
SUT SILK 2 0 SH CR/8 (SUTURE) ×2 IMPLANT
SUT SILK 3 0 RB1 (SUTURE) IMPLANT
SUT SILK 3 0 REEL (SUTURE) IMPLANT
SUT VIC AB 3-0 SH 27X BRD (SUTURE) IMPLANT
SUT VIC AB 4-0 PS2 18 (SUTURE) IMPLANT
SUT VIC AB 4-0 PS2 27 (SUTURE) IMPLANT
SUT VIC AB 4-0 RB1 18 (SUTURE) ×2 IMPLANT
SUT VIC AB 4-0 RB1 27X BRD (SUTURE) IMPLANT
SUT VIC AB 4-0 SH 18 (SUTURE) IMPLANT
SUT VICRYL 3-0 CR8 SH (SUTURE) ×1 IMPLANT
SUTURE SILK 3-0 RB1 30XBRD (SUTURE) IMPLANT
SYR 10ML LL (SYRINGE) ×1 IMPLANT
SYR BULB EAR ULCER 3OZ GRN STR (SYRINGE) ×1 IMPLANT
TOWEL GREEN STERILE FF (TOWEL DISPOSABLE) ×2 IMPLANT

## 2024-03-13 NOTE — Anesthesia Preprocedure Evaluation (Signed)
"                                    Anesthesia Evaluation  Patient identified by MRN, date of birth, ID band Patient awake    Reviewed: Allergy & Precautions, H&P , NPO status , Patient's Chart, lab work & pertinent test results  Airway Mallampati: II   Neck ROM: full    Dental   Pulmonary shortness of breath, asthma , sleep apnea , former smoker   breath sounds clear to auscultation       Cardiovascular negative cardio ROS  Rhythm:regular Rate:Normal     Neuro/Psych  Headaches PSYCHIATRIC DISORDERS Anxiety Depression     Neuromuscular disease    GI/Hepatic ,GERD  ,,  Endo/Other    Renal/GU stones     Musculoskeletal  (+) Arthritis ,    Abdominal   Peds  Hematology   Anesthesia Other Findings   Reproductive/Obstetrics H/o breast CA                              Anesthesia Physical Anesthesia Plan  ASA: 2  Anesthesia Plan: General   Post-op Pain Management:    Induction: Intravenous  PONV Risk Score and Plan: 3 and Ondansetron , Dexamethasone , Treatment may vary due to age or medical condition and Midazolam   Airway Management Planned: Oral ETT  Additional Equipment:   Intra-op Plan:   Post-operative Plan: Extubation in OR  Informed Consent: I have reviewed the patients History and Physical, chart, labs and discussed the procedure including the risks, benefits and alternatives for the proposed anesthesia with the patient or authorized representative who has indicated his/her understanding and acceptance.     Dental advisory given  Plan Discussed with: CRNA, Anesthesiologist and Surgeon  Anesthesia Plan Comments:         Anesthesia Quick Evaluation  "

## 2024-03-13 NOTE — Anesthesia Procedure Notes (Addendum)
 Procedure Name: Intubation Date/Time: 03/13/2024 1:47 PM  Performed by: Leotha Andrez DEL, CRNAPre-anesthesia Checklist: Patient identified, Emergency Drugs available, Suction available, Patient being monitored and Timeout performed Patient Re-evaluated:Patient Re-evaluated prior to induction Oxygen Delivery Method: Circle system utilized Preoxygenation: Pre-oxygenation with 100% oxygen Induction Type: IV induction Ventilation: Oral airway inserted - appropriate to patient size Laryngoscope Size: 3 and McGrath Grade View: Grade I Tube type: Oral Tube size: 7.0 mm Number of attempts: 1 Airway Equipment and Method: Stylet Placement Confirmation: ETT inserted through vocal cords under direct vision, positive ETCO2 and breath sounds checked- equal and bilateral Secured at: 21 cm Tube secured with: Tape Dental Injury: Teeth and Oropharynx as per pre-operative assessment  Difficulty Due To: Difficulty was anticipated and Difficult Airway- due to reduced neck mobility Comments: ACDF in 2024. Elective Glidescope intubation.

## 2024-03-13 NOTE — Op Note (Signed)
 SABRA

## 2024-03-13 NOTE — Discharge Instructions (Addendum)
 Post Anesthesia Guidelines  During recovery from anesthesia  You may feel drowsy and reflexes may be slowed for 24 hours:  -Do not drive, use machinery, appliances, ride bicycles or scooters  -Do not consume alcohol  -Do not make important decisions   Eating and drinking:  -Drink plenty of liquids today  -Avoid fried or spicy foods today  -Return to your regular diet slowly over the next 24 hours  -Please call if unable to keep fluids down  If your throat is sore: -A breathing tube may have been used during your surgery -Drink cool liquids or gargle with warm salt water  -If soreness lasts more than a few days, please call us   Surgery Discharge Instructions:  Call clinic or return to ED if you: - develop a fever greater than 101.4 - have shaking chills or are feeling ill - become short of breath - have uncontrollable nausea or vomiting - can't hold down food or liquids or feel as though you are getting dehydrated - urine output of less than 30cc/hr for 12 hours - develop significant redness, pain at incision(s) or wound opens up/separates - any other acute events, problems, or concerns  Wound Care/Dressings/Drain Instructions:  - To take care of your incision/cuts on neck and chest - Your incisions are closed with skin glue. They will peel off on their own in 1-2 weeks. You can let water  run over it in 48 hours like in a shower and gently pat dry. Avoid rubbing the incision or submerging under like like a bath or swimming pool.   - Starting at about 48 hours after surgery, do gentle neck rolls (5 rolls twice per day) to prevent any tethering of the neck - TO prevent infection, take Clindamycin  300mg  tablet 3 times per day as prescribed  Medications: - Resume your regular home medications except as detailed in the medication reconciliation.  - For pain, take tylenol  1000mg  every 6 hours. Avoid NSAIDs like ibuprofen  or aleeve or motrin . If that is not sufficient, a stronger  pain medication has been prescribed to you (oxycodone  5mg  tablet every 4-6 hours). Do not mix with any other narcotic medication such as the tramadol   Follow Up:  - A follow up appointment should be scheduled for you after discharge.   Activity/Restrictions:  - Resume your regular activities, as tolerated. Avoid heavy lifting or straining (more than 5 lbs) for 10 days.  Diet: - Resume your regular diet, as tolerated  Additional Instructions: - Please take an over the counter stool softener while taking narcotic pain medication - DO NOT MIX NARCOTIC PAIN MEDICATIONS OR TAKE NARCOTIC PRESCRIPTIONS AT THE SAME TIME - DO NOT DRIVE OR OPERATE HEAVY MACHINERY WHILE ON NARCOTICS  - DO NOT TAKE MORE THAN 4 GRAMS (4000mg ) OF TYLENOL  (ACETAMINOPHEN ) IN 24 HOURS  Department of Otolaryngology Contact Info: Otolaryngology Front Desk Phone including questions about appointments or questions: [phone] - If after normal business hours (Monday-Friday after 5PM or Weekends/Holidays), please call same number and follow prompts for Patient Access Line. There is a physician on call for urgent matters. For life threatening emergencies, please call 911     Post Anesthesia Home Care Instructions  Activity: Get plenty of rest for the remainder of the day. A responsible individual must stay with you for 24 hours following the procedure.  For the next 24 hours, DO NOT: -Drive a car -Advertising copywriter -Drink alcoholic beverages -Take any medication unless instructed by your physician -Make any legal decisions or sign important  papers.  Meals: Start with liquid foods such as gelatin or soup. Progress to regular foods as tolerated. Avoid greasy, spicy, heavy foods. If nausea and/or vomiting occur, drink only clear liquids until the nausea and/or vomiting subsides. Call your physician if vomiting continues.  Special Instructions/Symptoms: Your throat may feel dry or sore from the anesthesia or the  breathing tube placed in your throat during surgery. If this causes discomfort, gargle with warm salt water . The discomfort should disappear within 24 hours.  If you had a scopolamine patch placed behind your ear for the management of post- operative nausea and/or vomiting:  1. The medication in the patch is effective for 72 hours, after which it should be removed.  Wrap patch in a tissue and discard in the trash. Wash hands thoroughly with soap and water . 2. You may remove the patch earlier than 72 hours if you experience unpleasant side effects which may include dry mouth, dizziness or visual disturbances. 3. Avoid touching the patch. Wash your hands with soap and water  after contact with the patch.

## 2024-03-13 NOTE — Progress Notes (Signed)
 Time received tylenol  written on dc paperwork.

## 2024-03-13 NOTE — Transfer of Care (Signed)
 Immediate Anesthesia Transfer of Care Note  Patient: Leah [name]  Procedure(s) Performed: INSERTION, HYPOGLOSSAL NERVE STIMULATOR (Chest)  Patient Location: PACU  Anesthesia Type:General  Level of Consciousness: awake and patient cooperative  Airway & Oxygen Therapy: Patient Spontanous Breathing and Patient connected to nasal cannula oxygen  Post-op Assessment: Report given to RN and Post -op Vital signs reviewed and stable  Post vital signs: Reviewed and stable  Last Vitals:  Vitals Value Taken Time  BP 129/79 03/13/24 16:24  Temp    Pulse 87 03/13/24 16:27  Resp 40 03/13/24 16:27  SpO2 96 % 03/13/24 16:27  Vitals shown include unfiled device data.  Last Pain:  Vitals:   03/13/24 1117  TempSrc: Temporal  PainSc: 0-No pain         Complications: No notable events documented.

## 2024-03-13 NOTE — H&P (Signed)
 Pre-Operative H&P - Day Of Surgery Patient Name: Leah [name] Date:   03/13/2024  HPI: Leah [name] is a 58 y.o. female who presents today for operative treatment of obstructive sleep apnea, intolerance of continuous positive airway pressure ventilation. Patient denies recent significant changes to health or significant new medications or physiologic change in condition which would immediately impact plans. No new types of therapy has been initiated that would change the plan or the appropriateness of the plan.   ROS:  A complete review of systems was obtained and is otherwise negative.   PMH:  Past Medical History:  Diagnosis Date   Allergy 1970   When i was little   Anxiety    Arthritis    Breast cancer (HCC)    left [facility]   Carpal tunnel syndrome of right wrist    Childhood asthma    Colon polyps    Depression    Dyspnea    Eosinophilic esophagitis    noted in [place]  with GI path report 09/29/14    Esophageal stricture    s/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 Wadie Delaine    Family history of breast cancer    Family history of prostate cancer    Gastroesophageal reflux disease without esophagitis 04/09/2022   Herniated disc, cervical    s/p epidural injection   Herpes    History of kidney stones    Hyperlipidemia    Low back pain    Migraines    Osteoporosis Aug 2021   After chemo and radiation   Personal history of chemotherapy    Personal history of radiation therapy    Pneumonia    Sleep apnea    no CPAP   Tinea corporis 10/03/2017    PSH:  Past Surgical History:  Procedure Laterality Date   ANTERIOR CERVICAL DECOMP/DISCECTOMY FUSION N/A 07/29/2022   Procedure: C5-T1 ANTERIOR CERVICAL DISCECTOMY AND FUSION;  Surgeon: Clois Fret, MD;  Location: [facility] ORS;  Service: Neurosurgery;  Laterality: N/A;   APPLICATION OF INTRAOPERATIVE CT SCAN N/A 07/29/2022   Procedure: APPLICATION OF INTRAOPERATIVE CT SCAN;  Surgeon: Clois Fret, MD;   Location: [facility] ORS;  Service: Neurosurgery;  Laterality: N/A;   BREAST BIOPSY Left 12/25/2018   US  BX, invasive mammary carcinoma    BREAST LUMPECTOMY     BREAST LUMPECTOMY WITH NEEDLE LOCALIZATION AND AXILLARY SENTINEL LYMPH NODE BX Left 01/16/2019   Procedure: LEFT BREAST LUMPECTOMY WITH NEEDLE LOCALIZATION AND SENTINEL LYMPH NODE BIOPSY;  Surgeon: Curvin Deward MOULD, MD;  Location: [facility] ORS;  Service: General;  Laterality: Left;   CARPAL TUNNEL RELEASE Left 10/26/2023   Procedure: CARPAL TUNNEL RELEASE;  Surgeon: Claudene Penne ORN, MD;  Location: [facility] ORS;  Service: Neurosurgery;  Laterality: Left;  LEFT CARPAL TUNNEL RELEASE WITH ULTRASOUND GUIDANCE, POSSIBLE CONVERT TO OPEN   CESAREAN SECTION     COLONOSCOPY WITH PROPOFOL  N/A 03/27/2017   Procedure: COLONOSCOPY WITH PROPOFOL ;  Surgeon: Therisa Bi, MD;  Location: [facility] ENDOSCOPY;  Service: Gastroenterology;  Laterality: N/A;   COLONOSCOPY WITH PROPOFOL  N/A 04/04/2022   Procedure: COLONOSCOPY WITH PROPOFOL ;  Surgeon: Therisa Bi, MD;  Location: [facility] ENDOSCOPY;  Service: Gastroenterology;  Laterality: N/A;   DRUG INDUCED ENDOSCOPY N/A 01/29/2024   Procedure: DRUG INDUCED SLEEP ENDOSCOPY;  Surgeon: Tobie Eldora NOVAK, MD;  Location: [facility];  Service: ENT;  Laterality: N/A;   ESOPHAGOGASTRODUODENOSCOPY     ESOPHAGOGASTRODUODENOSCOPY (EGD) WITH PROPOFOL  N/A 03/27/2017   Procedure: ESOPHAGOGASTRODUODENOSCOPY (EGD) WITH PROPOFOL  WITH DILATION;  Surgeon:  Therisa Bi, MD;  Location: [facility] ENDOSCOPY;  Service: Gastroenterology;  Laterality: N/A;   HERNIA REPAIR     Had it done twice   PORT-A-CATH REMOVAL     PORTACATH PLACEMENT N/A 02/15/2019   Procedure: INSERTION PORT-A-CATH WITH POSSIBLE ULTRASOUND;  Surgeon: Curvin Deward MOULD, MD;  Location: [facility] ORS;  Service: General;  Laterality: N/A;   POSTERIOR CERVICAL FUSION/FORAMINOTOMY N/A 07/29/2022   Procedure: C2-T2 POSTERIOR SPINAL FUSION;  Surgeon: Clois Fret, MD;  Location: [facility]  ORS;  Service: Neurosurgery;  Laterality: N/A;   RE-EXCISION OF BREAST CANCER,SUPERIOR MARGINS Left 02/15/2019   Procedure: RE-EXCISION OF LEFT BREAST CANCER ANTERIOR MARGINS;  Surgeon: Curvin Deward MOULD, MD;  Location: [facility] ORS;  Service: General;  Laterality: Left;   THROAT SURGERY  2015   UMBILICAL HERNIA REPAIR  2006    x 2   w mesh per pt    MEDS:  Current Medications[1]  ALLERGIES: Hydrocodone -acetaminophen , Lactose intolerance (gi), Cephalexin , Penicillins, and Sulfa antibiotics  EXAM: Vitals: BP 119/87   Pulse 76   Temp 98.2 F (36.8 C) (Temporal)   Resp 16   Ht 5' 3 (1.6 m)   Wt 80.2 kg   SpO2 98%   BMI 31.32 kg/m   General Awake, at baseline alertness.   HEENT No scleral icterus or conjunctival hemorrhage. Globe position appears normal. External ears  normal. Nose patent without rhinorrhea. No lymphadenopathy. No thyromegaly  Cardiovascular No cyanosis.  Pulmonary No audible stridor. Breathing easily with no labor.  Neuro Symmetric facial movement.   Psychiatry Appropriate affect and mood.  Skin No scars or lesions on face or neck.  Extermities Moves all extremities with normal range of motion.   Other Findings None.   Assessment & Plan: Neomia has diagnoses of obstructive sleep apnea, intolerance of continuous positive airway pressure ventilation and will go to the OR today for right hypoglossal nerve stimulator placement. Informed consent was obtained and available in EMR today. All questions have been answered, and risks/benefits/alternatives of procedure as noted in the consent were discussed in a quiet area. Questions were invited and answered. The patient expressed understanding, provided consent and wished to proceed despite risks.  Leah [name] 03/13/2024 12:24 PM     [1]  Current Facility-Administered Medications:    lactated ringers  infusion, , Intravenous, Continuous, Lucious Debby BRAVO, MD

## 2024-03-14 DIAGNOSIS — G4733 Obstructive sleep apnea (adult) (pediatric): Secondary | ICD-10-CM

## 2024-03-14 NOTE — Anesthesia Postprocedure Evaluation (Signed)
"   Anesthesia Post Note  Patient: [name]  Procedure(s) Performed: INSERTION, HYPOGLOSSAL NERVE STIMULATOR (Chest)     Patient location during evaluation: PACU Anesthesia Type: General Level of consciousness: awake and alert Pain management: pain level controlled Vital Signs Assessment: post-procedure vital signs reviewed and stable Respiratory status: spontaneous breathing, nonlabored ventilation, respiratory function stable and patient connected to nasal cannula oxygen Cardiovascular status: blood pressure returned to baseline and stable Postop Assessment: no apparent nausea or vomiting Anesthetic complications: no   No notable events documented.  Last Vitals:  Vitals:   03/13/24 1645 03/13/24 1658  BP: 132/76 127/74  Pulse: 84 77  Resp: (!) 21 20  Temp:  36.8 C  SpO2: 94% 96%    Last Pain:  Vitals:   03/13/24 1658  TempSrc:   PainSc: 0-No pain                 [name]      "

## 2024-03-15 ENCOUNTER — Telehealth: Payer: Self-pay | Admitting: Sleep Medicine

## 2024-03-15 ENCOUNTER — Encounter (HOSPITAL_BASED_OUTPATIENT_CLINIC_OR_DEPARTMENT_OTHER): Payer: Self-pay | Admitting: Otolaryngology

## 2024-03-15 NOTE — Telephone Encounter (Signed)
 Attempted to reach out to patient to scheduled 4-6 week follow up with Dr. Jess in [place]

## 2024-03-20 ENCOUNTER — Ambulatory Visit (INDEPENDENT_AMBULATORY_CARE_PROVIDER_SITE_OTHER): Admitting: Otolaryngology

## 2024-03-20 ENCOUNTER — Encounter (INDEPENDENT_AMBULATORY_CARE_PROVIDER_SITE_OTHER): Payer: Self-pay | Admitting: Otolaryngology

## 2024-03-20 VITALS — BP 114/70 | HR 91 | Ht 63.0 in | Wt 176.0 lb

## 2024-03-20 DIAGNOSIS — Z09 Encounter for follow-up examination after completed treatment for conditions other than malignant neoplasm: Secondary | ICD-10-CM

## 2024-03-20 DIAGNOSIS — G4733 Obstructive sleep apnea (adult) (pediatric): Secondary | ICD-10-CM

## 2024-03-20 DIAGNOSIS — Z789 Other specified health status: Secondary | ICD-10-CM

## 2024-03-20 NOTE — Progress Notes (Signed)
 S/p Hypoglossal nerve stimulator placement on 03/13/2024 S: Doing well, pain fairly well controlled; no fevers or issue with wound. No tongue weakness, no lip weakness. No SOB O: Tongue movement intact; no lower lip weakness; neck and chest incision c/d/I, no evidence of infection A/P: 59 y.o. y.o. w/ OSA s/p Hypoglossal nerve stimulator placement on 03/13/2024 Doing well, no evidence of infection Recommend continued gentle neck rolls F/u as needed -- has follow up with Dr. Jess Eldora KATHEE Tobie

## 2024-03-21 NOTE — Telephone Encounter (Signed)
 Patient scheduled with Dr. Jess 04/15/2024- nothing further needed.

## 2024-04-05 ENCOUNTER — Telehealth: Payer: Self-pay

## 2024-04-05 NOTE — Telephone Encounter (Signed)
 Left VM and MyChart message to let pt know 04/08/24 appt has been moved from in person to virtual due to expected inclement weather

## 2024-04-05 NOTE — Telephone Encounter (Signed)
 Left VM to let pt know virtual appt will now be moved from 8 am to 1 pm, office will be closed until at least noon with chance of being fully canceled.   [id number], please reschedule pt if they call back and this change will not work for them

## 2024-04-08 ENCOUNTER — Ambulatory Visit

## 2024-04-15 ENCOUNTER — Ambulatory Visit: Admitting: Sleep Medicine

## 2024-04-22 ENCOUNTER — Ambulatory Visit: Admitting: Sleep Medicine

## 2024-06-03 ENCOUNTER — Ambulatory Visit: Admitting: Oncology
